# Patient Record
Sex: Male | Born: 1940 | Race: White | Hispanic: No | Smoking: Former smoker
Health system: Southern US, Community
[De-identification: ages and names within clinical notes are randomized; demographics above are authoritative.]

## PROBLEM LIST (undated history)

## (undated) DIAGNOSIS — E119 Type 2 diabetes mellitus without complications: Secondary | ICD-10-CM

## (undated) DIAGNOSIS — J309 Allergic rhinitis, unspecified: Secondary | ICD-10-CM

## (undated) DIAGNOSIS — G709 Myoneural disorder, unspecified: Secondary | ICD-10-CM

## (undated) DIAGNOSIS — N3281 Overactive bladder: Secondary | ICD-10-CM

## (undated) DIAGNOSIS — K4090 Unilateral inguinal hernia, without obstruction or gangrene, not specified as recurrent: Secondary | ICD-10-CM

## (undated) DIAGNOSIS — G20A1 Parkinson's disease without dyskinesia, without mention of fluctuations: Secondary | ICD-10-CM

## (undated) DIAGNOSIS — M1990 Unspecified osteoarthritis, unspecified site: Secondary | ICD-10-CM

## (undated) DIAGNOSIS — Z87442 Personal history of urinary calculi: Secondary | ICD-10-CM

## (undated) DIAGNOSIS — N189 Chronic kidney disease, unspecified: Secondary | ICD-10-CM

## (undated) DIAGNOSIS — I10 Essential (primary) hypertension: Secondary | ICD-10-CM

## (undated) DIAGNOSIS — E785 Hyperlipidemia, unspecified: Secondary | ICD-10-CM

## (undated) DIAGNOSIS — F329 Major depressive disorder, single episode, unspecified: Secondary | ICD-10-CM

## (undated) DIAGNOSIS — R32 Unspecified urinary incontinence: Secondary | ICD-10-CM

## (undated) DIAGNOSIS — C61 Malignant neoplasm of prostate: Secondary | ICD-10-CM

## (undated) DIAGNOSIS — R319 Hematuria, unspecified: Secondary | ICD-10-CM

## (undated) DIAGNOSIS — T7840XA Allergy, unspecified, initial encounter: Secondary | ICD-10-CM

## (undated) DIAGNOSIS — IMO0001 Reserved for inherently not codable concepts without codable children: Secondary | ICD-10-CM

## (undated) DIAGNOSIS — G20 Parkinson's disease: Secondary | ICD-10-CM

## (undated) DIAGNOSIS — Z8489 Family history of other specified conditions: Secondary | ICD-10-CM

## (undated) DIAGNOSIS — F32A Depression, unspecified: Secondary | ICD-10-CM

## (undated) HISTORY — PX: JOINT REPLACEMENT: SHX530

## (undated) HISTORY — DX: Allergy, unspecified, initial encounter: T78.40XA

## (undated) HISTORY — DX: Essential (primary) hypertension: I10

## (undated) HISTORY — DX: Parkinson's disease: G20

## (undated) HISTORY — PX: FRACTURE SURGERY: SHX138

## (undated) HISTORY — PX: EYE SURGERY: SHX253

## (undated) HISTORY — DX: Unspecified osteoarthritis, unspecified site: M19.90

## (undated) HISTORY — DX: Malignant neoplasm of prostate: C61

## (undated) HISTORY — PX: OTHER SURGICAL HISTORY: SHX169

## (undated) HISTORY — DX: Personal history of urinary calculi: Z87.442

## (undated) HISTORY — DX: Unilateral inguinal hernia, without obstruction or gangrene, not specified as recurrent: K40.90

## (undated) HISTORY — PX: HERNIA REPAIR: SHX51

## (undated) HISTORY — PX: URETEROSCOPY WITH HOLMIUM LASER LITHOTRIPSY: SHX6645

## (undated) HISTORY — DX: Parkinson's disease without dyskinesia, without mention of fluctuations: G20.A1

## (undated) HISTORY — DX: Hyperlipidemia, unspecified: E78.5

## (undated) HISTORY — PX: VASECTOMY: SHX75

---

## 1991-07-14 HISTORY — PX: OTHER SURGICAL HISTORY: SHX169

## 2003-08-31 ENCOUNTER — Other Ambulatory Visit: Payer: Self-pay

## 2008-02-14 ENCOUNTER — Ambulatory Visit: Payer: Self-pay | Admitting: Specialist

## 2008-02-21 ENCOUNTER — Ambulatory Visit: Payer: Self-pay | Admitting: Specialist

## 2008-07-13 HISTORY — PX: LITHOTRIPSY: SUR834

## 2009-10-16 ENCOUNTER — Emergency Department: Payer: Self-pay | Admitting: Emergency Medicine

## 2009-10-23 ENCOUNTER — Ambulatory Visit: Payer: Self-pay | Admitting: Specialist

## 2010-09-11 LAB — PSA: PSA: <0.1

## 2011-01-14 ENCOUNTER — Emergency Department: Payer: Self-pay | Admitting: Emergency Medicine

## 2011-03-07 ENCOUNTER — Emergency Department: Payer: Self-pay | Admitting: Emergency Medicine

## 2011-03-10 ENCOUNTER — Other Ambulatory Visit: Payer: Self-pay | Admitting: Unknown Physician Specialty

## 2011-03-10 ENCOUNTER — Inpatient Hospital Stay
Admission: RE | Admit: 2011-03-10 | Discharge: 2011-03-10 | Disposition: A | Discharge status: Home / Self Care | Payer: BC Managed Care – PPO | Source: Ambulatory Visit

## 2011-03-10 ENCOUNTER — Ambulatory Visit
Admission: RE | Admit: 2011-03-10 | Discharge: 2011-03-10 | Disposition: A | Discharge status: Home / Self Care | Payer: BC Managed Care – PPO | Source: Ambulatory Visit | Attending: Unknown Physician Specialty | Admitting: Unknown Physician Specialty

## 2011-03-10 DIAGNOSIS — N200 Calculus of kidney: Secondary | ICD-10-CM

## 2011-04-02 ENCOUNTER — Ambulatory Visit: Payer: Self-pay | Admitting: Urology

## 2011-04-16 ENCOUNTER — Ambulatory Visit: Payer: Self-pay | Admitting: Urology

## 2012-03-15 ENCOUNTER — Ambulatory Visit: Payer: Self-pay | Admitting: Family Medicine

## 2012-05-04 ENCOUNTER — Emergency Department: Payer: Self-pay | Admitting: Emergency Medicine

## 2012-05-05 ENCOUNTER — Other Ambulatory Visit: Payer: Self-pay | Admitting: Family Medicine

## 2012-05-05 DIAGNOSIS — M5440 Lumbago with sciatica, unspecified side: Secondary | ICD-10-CM

## 2012-05-09 ENCOUNTER — Other Ambulatory Visit: Payer: Self-pay | Admitting: Family Medicine

## 2012-05-09 DIAGNOSIS — M545 Low back pain, unspecified: Secondary | ICD-10-CM

## 2012-05-10 ENCOUNTER — Ambulatory Visit
Admission: RE | Admit: 2012-05-10 | Discharge: 2012-05-10 | Disposition: A | Discharge status: Home / Self Care | Payer: BC Managed Care – PPO | Source: Ambulatory Visit | Attending: Family Medicine | Admitting: Family Medicine

## 2012-05-10 ENCOUNTER — Other Ambulatory Visit: Payer: Self-pay

## 2012-05-10 DIAGNOSIS — M545 Low back pain, unspecified: Secondary | ICD-10-CM

## 2012-05-10 DIAGNOSIS — M5137 Other intervertebral disc degeneration, lumbosacral region: Secondary | ICD-10-CM | POA: Insufficient documentation

## 2012-05-24 ENCOUNTER — Ambulatory Visit: Payer: Self-pay | Admitting: Urology

## 2012-07-13 HISTORY — PX: BACK SURGERY: SHX140

## 2012-11-24 ENCOUNTER — Other Ambulatory Visit: Payer: Self-pay | Admitting: Specialist

## 2012-11-24 LAB — SYNOVIAL CELL COUNT + DIFF, W/ CRYSTALS
Basophil: 0 %
Crystals, Joint Fluid: NONE SEEN
Eosinophil: 0 %
Lymphocytes: 14 %
Neutrophils: 22 %
Nucleated Cell Count: 408 /mm3
Other Cells BF: 0 %
Other Mononuclear Cells: 64 %

## 2012-11-28 LAB — BODY FLUID CULTURE

## 2013-04-05 ENCOUNTER — Other Ambulatory Visit: Payer: Self-pay | Admitting: Neurology

## 2013-04-05 DIAGNOSIS — R251 Tremor, unspecified: Secondary | ICD-10-CM

## 2013-04-05 DIAGNOSIS — R269 Unspecified abnormalities of gait and mobility: Secondary | ICD-10-CM

## 2013-04-12 ENCOUNTER — Ambulatory Visit
Admission: RE | Admit: 2013-04-12 | Discharge: 2013-04-12 | Disposition: A | Discharge status: Home / Self Care | Payer: BC Managed Care – PPO | Source: Ambulatory Visit | Attending: Neurology | Admitting: Neurology

## 2013-04-12 DIAGNOSIS — R251 Tremor, unspecified: Secondary | ICD-10-CM

## 2013-04-12 DIAGNOSIS — R269 Unspecified abnormalities of gait and mobility: Secondary | ICD-10-CM

## 2013-04-26 ENCOUNTER — Ambulatory Visit: Payer: Self-pay | Admitting: Urology

## 2013-04-27 ENCOUNTER — Ambulatory Visit: Payer: Self-pay | Admitting: Urology

## 2013-07-13 HISTORY — PX: OTHER SURGICAL HISTORY: SHX169

## 2013-09-27 ENCOUNTER — Ambulatory Visit: Payer: Self-pay | Admitting: Ophthalmology

## 2013-10-11 ENCOUNTER — Ambulatory Visit: Payer: Self-pay | Admitting: Ophthalmology

## 2014-02-19 ENCOUNTER — Ambulatory Visit: Payer: Self-pay | Admitting: Urology

## 2014-02-22 LAB — PATHOLOGY REPORT

## 2014-03-21 ENCOUNTER — Ambulatory Visit: Payer: Self-pay | Admitting: Specialist

## 2014-03-21 LAB — URINALYSIS, COMPLETE
Bacteria: NONE SEEN
Bilirubin,UR: NEGATIVE
Blood: NEGATIVE
Glucose,UR: NEGATIVE mg/dL (ref 0–75)
Ketone: NEGATIVE
Nitrite: NEGATIVE
Ph: 7 (ref 4.5–8.0)
Protein: NEGATIVE
RBC,UR: NONE SEEN /HPF (ref 0–5)
Specific Gravity: 1.013 (ref 1.003–1.030)
Squamous Epithelial: NONE SEEN
WBC UR: 16 /HPF (ref 0–5)

## 2014-03-21 LAB — BASIC METABOLIC PANEL
Anion Gap: 7 (ref 7–16)
BUN: 19 mg/dL — ABNORMAL HIGH (ref 7–18)
Calcium, Total: 8.8 mg/dL (ref 8.5–10.1)
Chloride: 103 mmol/L (ref 98–107)
Co2: 30 mmol/L (ref 21–32)
Creatinine: 0.96 mg/dL (ref 0.60–1.30)
EGFR (African American): >60
EGFR (Non-African Amer.): >60
Glucose: 89 mg/dL (ref 65–99)
Osmolality: 281 (ref 275–301)
Potassium: 4.2 mmol/L (ref 3.5–5.1)
Sodium: 140 mmol/L (ref 136–145)

## 2014-03-21 LAB — CBC
HCT: 43.2 % (ref 40.0–52.0)
HGB: 14 g/dL (ref 13.0–18.0)
MCH: 30.6 pg (ref 26.0–34.0)
MCHC: 32.3 g/dL (ref 32.0–36.0)
MCV: 95 fL (ref 80–100)
Platelet: 209 10*3/uL (ref 150–440)
RBC: 4.56 10*6/uL (ref 4.40–5.90)
RDW: 13.7 % (ref 11.5–14.5)
WBC: 6.4 10*3/uL (ref 3.8–10.6)

## 2014-03-21 LAB — MRSA PCR SCREENING

## 2014-03-21 LAB — PROTIME-INR
INR: 1
Prothrombin Time: 13 secs (ref 11.5–14.7)

## 2014-04-02 ENCOUNTER — Inpatient Hospital Stay: Payer: Self-pay | Admitting: Specialist

## 2014-04-03 LAB — CBC WITH DIFFERENTIAL/PLATELET
Basophil #: 0 10*3/uL (ref 0.0–0.1)
Basophil %: 0.2 %
Eosinophil #: 0.2 10*3/uL (ref 0.0–0.7)
Eosinophil %: 2.6 %
HCT: 32.2 % — ABNORMAL LOW (ref 40.0–52.0)
HGB: 10.6 g/dL — ABNORMAL LOW (ref 13.0–18.0)
Lymphocyte #: 0.8 10*3/uL — ABNORMAL LOW (ref 1.0–3.6)
Lymphocyte %: 8.8 %
MCH: 31.2 pg (ref 26.0–34.0)
MCHC: 33 g/dL (ref 32.0–36.0)
MCV: 95 fL (ref 80–100)
Monocyte #: 1.2 x10 3/mm — ABNORMAL HIGH (ref 0.2–1.0)
Monocyte %: 13.9 %
Neutrophil #: 6.5 10*3/uL (ref 1.4–6.5)
Neutrophil %: 74.5 %
Platelet: 159 10*3/uL (ref 150–440)
RBC: 3.4 10*6/uL — ABNORMAL LOW (ref 4.40–5.90)
RDW: 13.9 % (ref 11.5–14.5)
WBC: 8.8 10*3/uL (ref 3.8–10.6)

## 2014-04-03 LAB — BASIC METABOLIC PANEL
Anion Gap: 5 — ABNORMAL LOW (ref 7–16)
BUN: 21 mg/dL — ABNORMAL HIGH (ref 7–18)
Calcium, Total: 7.1 mg/dL — ABNORMAL LOW (ref 8.5–10.1)
Chloride: 102 mmol/L (ref 98–107)
Co2: 27 mmol/L (ref 21–32)
Creatinine: 1.53 mg/dL — ABNORMAL HIGH (ref 0.60–1.30)
EGFR (African American): 51 — ABNORMAL LOW
EGFR (Non-African Amer.): 44 — ABNORMAL LOW
Glucose: 108 mg/dL — ABNORMAL HIGH (ref 65–99)
Osmolality: 272 (ref 275–301)
Potassium: 3.8 mmol/L (ref 3.5–5.1)
Sodium: 134 mmol/L — ABNORMAL LOW (ref 136–145)

## 2014-04-03 LAB — TSH: Thyroid Stimulating Horm: 1.25 u[IU]/mL

## 2014-04-04 LAB — BASIC METABOLIC PANEL
Anion Gap: 4 — ABNORMAL LOW (ref 7–16)
BUN: 20 mg/dL — ABNORMAL HIGH (ref 7–18)
Calcium, Total: 7.7 mg/dL — ABNORMAL LOW (ref 8.5–10.1)
Chloride: 107 mmol/L (ref 98–107)
Co2: 29 mmol/L (ref 21–32)
Creatinine: 1.3 mg/dL (ref 0.60–1.30)
EGFR (African American): >60
EGFR (Non-African Amer.): 54 — ABNORMAL LOW
Glucose: 129 mg/dL — ABNORMAL HIGH (ref 65–99)
Osmolality: 284 (ref 275–301)
Potassium: 4.1 mmol/L (ref 3.5–5.1)
Sodium: 140 mmol/L (ref 136–145)

## 2014-04-04 LAB — HEMOGLOBIN: HGB: 12.1 g/dL — ABNORMAL LOW (ref 13.0–18.0)

## 2014-04-06 LAB — BASIC METABOLIC PANEL
Anion Gap: 7 (ref 7–16)
BUN: 13 mg/dL (ref 7–18)
Calcium, Total: 8.1 mg/dL — ABNORMAL LOW (ref 8.5–10.1)
Chloride: 103 mmol/L (ref 98–107)
Co2: 26 mmol/L (ref 21–32)
Creatinine: 0.94 mg/dL (ref 0.60–1.30)
EGFR (African American): >60
EGFR (Non-African Amer.): >60
Glucose: 121 mg/dL — ABNORMAL HIGH (ref 65–99)
Osmolality: 273 (ref 275–301)
Potassium: 3.8 mmol/L (ref 3.5–5.1)
Sodium: 136 mmol/L (ref 136–145)

## 2014-06-19 LAB — BASIC METABOLIC PANEL
BUN: 20 mg/dL (ref 4–21)
Creatinine: 1 mg/dL (ref 0.6–1.3)
Glucose: 97 mg/dL
Potassium: 4.5 mmol/L (ref 3.4–5.3)

## 2014-06-19 LAB — HEPATIC FUNCTION PANEL
ALT: 15 U/L (ref 10–40)
AST: 19 U/L (ref 14–40)
Alkaline Phosphatase: 73 U/L (ref 25–125)
Bilirubin, Total: 0.6 mg/dL

## 2014-06-19 LAB — CBC AND DIFFERENTIAL
HCT: 43 % (ref 41–53)
Hemoglobin: 14.4 g/dL (ref 13.5–17.5)
Neutrophils Absolute: 4 /uL
Platelets: 253 10*3/uL (ref 150–399)
WBC: 6.2 10^3/mL

## 2014-06-19 LAB — TSH: TSH: 1.9 u[IU]/mL (ref 0.41–5.90)

## 2014-10-04 LAB — HEMOGLOBIN A1C: Hgb A1c MFr Bld: 6.3 % — AB (ref 4.0–6.0)

## 2014-10-04 LAB — LIPID PANEL
Cholesterol: 226 mg/dL — AB (ref 0–200)
HDL: 47 mg/dL (ref 35–70)
LDl/HDL Ratio: 3.4
Triglycerides: 98 mg/dL (ref 40–160)

## 2014-11-03 NOTE — Op Note (Signed)
PATIENT NAME:  Roger Rodriguez, PILE MR#:  [id number] DATE OF BIRTH:  1940-11-15  DATE OF PROCEDURE:  04/02/2014  PREOPERATIVE DIAGNOSIS: Advanced osteoarthritis right knee with slight increased valgus deformity.   POSTOPERATIVE DIAGNOSIS: Advanced osteoarthritis right knee with slight increased valgus deformity.   OPERATION: DePuy cemented rotating platform LCS total knee replacement (large femur/patella, number 4 keeled tibia, 12.5 mm rotating platform polyethylene insert).   SURGEON: Park Breed, MD.    ANESTHESIA: Spinal.   COMPLICATIONS: None.   DRAINS: 2 Autovacs.   ESTIMATED BLOOD LOSS: None.   REPLACED: None.    OPERATIVE PROCEDURE: The patient was brought to the operating room where he underwent satisfactory spinal anesthesia and was placed in the supine position on the operating room table and padded appropriately. The right leg was prepped and draped in sterile fashion and an Esmarch applied. The tourniquet was inflated to 300 mmHg. Tourniquet time was 122 minutes. An anterior midline incision was made and dissection carried out sharply down through subcutaneous tissue. The medial arthrotomy was carried out and soft tissue dissection carried out. The anterior tibial cutting guide was pinned in place and the proximal tibial cut made. The femur was sized as a large. The ligaments were balanced by releasing medially. The anterior femoral cutting guide was put in place with the centering hole and rotation guide was inserted. Flexion gap was established at 12.5 mm with excellent positioning. The femoral cutting guide was pinned in place and the anterior posterior cuts made. A 4 degree valgus distal femoral cutting guide was inserted and the distal cut made. The extension gap was established at 12.5 mm and was stable. The finishing guide was applied to the femur and the finishing cuts made.  The tibia was sized as number 4.  A centering hole was made. The trial was inserted along with a 12.5  tibial trial. The femoral component was inserted and the knee articulated well. There was excellent alignment with good extension and flexion and good stability. The patella was cut and drilled and trial inserted. This tracked well also. There was no need for a lateral release. The trials were removed and the joint irrigated. Soft tissues were infiltrated with 0.25% Marcaine with epinephrine, morphine, and Toradol. The cement was mixed and the number 4 keeled tibia, large femur, and large patella were all cemented in place with a 12.5 mm rotating platform polyethylene insert. Excess cement was removed and the knee was held in extension while the cement hardened fully. The joint was then irrigated again. The Autovacs were inserted. Bone wax was placed on raw bony surfaces. The capsule was closed with number 2 Quill suture. The subcutaneous tissue was closed with 0 Quill after further irrigation and the skin was closed with staples. Dry sterile dressing was applied over TENS pads and a Polar Care and knee immobilizer were applied. Tourniquet had been deflated with good return of blood flow to the foot. The patient was transferred to a hospital bed and taken to recovery in good condition.     ____________________________ Park Breed, MD hem:bu D: 04/02/2014 15:51:01 ET T: 04/02/2014 20:16:47 ET JOB#: [id number]  cc: Park Breed, MD, <Dictator> Park Breed MD ELECTRONICALLY SIGNED 04/03/2014 14:58

## 2014-11-03 NOTE — Op Note (Signed)
PATIENT NAME:  Roger Rodriguez, Roger Rodriguez MR#:  [id number] DATE OF BIRTH:  04-15-1941  DATE OF PROCEDURE:  02/19/2014  PREOPERATIVE DIAGNOSIS: Right inguinal hernia.   POSTOPERATIVE DIAGNOSIS: Right inguinal hernia.   PROCEDURE: Right inguinal herniorrhaphy.   SURGEON: Otelia Limes. Yves Dill, MD  ANESTHETIST: Estanislado Emms, MD and Otelia Limes. Yves Dill, MD.   ANESTHETIC METHOD: General per Dr. Carolin Sicks and local per Dr. Yves Dill.   INDICATIONS: See the dictated history and physical. After informed consent, the patient requests the above procedure.   OPERATIVE SUMMARY: After adequate general anesthesia had been obtained, perineum was prepped and draped in the usual fashion. A right inguinal crease incision was made and carried down sharply through the skin and through the subcutaneous fatty tissue with cautery. The external oblique fascia was identified and cleared of overlying fatty tissue. The external oblique fascia was then opened along the course of its fibers exposing the spermatic cord and hernia. The spermatic cord and hernia sac were delivered into the surgical field. The hernia sac was dissected caudad to the internal ring off of the cord. The hernia sac was then ligated at its base with a 2-0 Surgilon and the sac was excised and submitted to pathology. At this point, a space was developed in the retropubic area using blunt dissection. This retropubic space was then irrigated with GU irrigant. The Ethicon PHS graft was then selected and circular portion unfurled in the retropubic space. The oblong portion was then placed along the course of the external oblique fibers beneath the external oblique. A keyhole incision was made laterally to accommodate the spermatic cord. The keyhole edges were brought around the cord and then anchored to the inguinal ligament with a 2-0 Surgilon suture. The spermatic cord then was placed into its normal anatomic position. The oblong portion of the graft was then flattened  beneath the external oblique fibers. The distal edge of the oblong portion of the graft was then sutured to the pubic tubercle with a 2-0 Surgilon suture. Surgical field was then again irrigated with GU irrigant. The external oblique fascia was then reapproximated with interrupted 2 0 Surgilon suture. Subcutaneous fatty tissue was reapproximated with interrupted 3-0 chromic. Skin was closed with 4-0 Vicryl subcuticular closure. At this point, local anesthesia was administered. Xylocaine 1% with epinephrine was used along the inguinal incision and a spermatic cord block was performed with 0.25% Marcaine. At this point, Dermabond, benzoin, Steri-Strips, and a sterile dressing were applied. A scrotal supporter was applied. The procedure was then terminated and patient was transferred to the recovery room in stable condition. Sponge and needle counts and instrument counts were noted correct.   ____________________________ Otelia Limes. Yves Dill, MD mrw:sk D: 02/19/2014 16:53:46 ET T: 02/20/2014 00:15:48 ET JOB#: [id number]  cc: Otelia Limes. Yves Dill, MD, <Dictator> Royston Cowper MD ELECTRONICALLY SIGNED 02/20/2014 11:49

## 2014-11-03 NOTE — Consult Note (Signed)
PATIENT NAME:  Roger Rodriguez, Roger Rodriguez MR#:  [id number] DATE OF BIRTH:  1940/09/20  DATE OF CONSULTATION:  04/03/2014  CONSULTING PHYSICIAN:  Norva Riffle. Marcille Blanco, MD  REASON FOR CONSULT:  Hypotension.   HISTORY OF PRESENT ILLNESS:  This is a 74 year old Caucasian male who was admitted to the hospital for a right total knee replacement. The patient had been in his usual state of relatively good health prior to admission and had no complications with his surgery. Today is postoperative day #1, and the patient had been pain-free and had normal vital signs until the early hours of this morning when his blood pressure began to trend down. He was given a 500 mL bolus for systolic pressure of 70. His blood pressure has minimally improved up to 80, and internal medicine service was consulted for further guidance.   REVIEW OF SYSTEMS:  Is not obtainable at this time, as the patient is soundly asleep. His vital signs are normal, and there is no concern for acute mental status change. He has been awake for a long period of time.   PAST MEDICAL HISTORY:  Parkinson disease, osteoarthritis, prostate cancer status post resection, and hypertension.   PAST SURGICAL HISTORY:  Uteroscopic stone extraction in 2003, lithotripsy in 2003, radical prostatectomy in 2005, vasectomy, rotator cuff repair in 1993, and a right knee arthroscopy in 1990.   FAMILY HISTORY:  Not obtainable at this time, as the patient is fast asleep.   SOCIAL HISTORY:  The patient lives with his wife, who has Alzheimer disease. He does not smoke, do drugs, or drink.   HOME MEDICATIONS:   1.  Celebrex 200 mg 1 cap p.o. b.i.d.  2.  Tramadol 50 mg 1 tab p.o. daily as needed for pain.  3.  Prinivil 5 mg 1 tablet p.o. daily.  4.  Claritin 10 mg 1 tab p.o. daily.  5.  Carbidopa/levodopa 25/100 mg tablet 1 tab p.o. b.i.d. 6.  Furosemide 20 mg 1 tab p.o. daily.  7.  Rhinocort Aqua 32 mcg/inhalation 1 spray to each nostril daily as needed.  8.  Myrbetriq 50  mg 1 tab p.o. daily.  9.  Vitamin C 500 mg 1 tab p.o. daily.  10.  Vitamin D3, 1000 international units 1 capsule p.o. daily.  11.  Vitamin E 400 international units 1 capsule p.o. daily.   ALLERGIES:  No known drug allergies.   PERTINENT LABORATORY RESULTS AND RADIOGRAPHIC FINDINGS:  Right knee x-ray shows potential foreign bodies, osseous demineralization, and a right knee prosthesis without definite fracture or dislocation.   PHYSICAL EXAMINATION: VITAL SIGNS:  Temperature is 98.1, pulse 71, respirations 16, blood pressure 85/46, pulse oximetry 94% on room air.  GENERAL:  The patient is soundly asleep in no apparent distress.  HEENT:  Normocephalic, atraumatic. Pupils are reactive to light. Mucous membranes are moist.  NECK:  Trachea is midline. No adenopathy.  CHEST:  Symmetric and atraumatic.  CARDIOVASCULAR:  Regular rate and rhythm. Normal S1, S2. No rubs, clicks, or murmurs appreciated.  LUNGS:  Clear to auscultation bilaterally. Normal effort and excursion.  ABDOMEN:  Positive bowel sounds. Soft, nontender, nondistended. No hepatosplenomegaly.  GENITOURINARY:  Foley is in place.  MUSCULOSKELETAL:  The patient has a knee immobilizer on the right knee, as well as a drain that is placed at the surgical site. He also has the Polar Care unit placed on his leg. The patient shifted in bed, and he clearly moves his upper extremities equally. Again, he is soundly asleep and  is not cooperative with physical examination.  SKIN:  No rashes or lesions. The surgical site is clean and dressed.  EXTREMITIES:  No clubbing, cyanosis, or edema.  NEUROLOGIC:  The patient is barely arousable in a state of sleep. He is uncooperative with neurologic exam.  PSYCHIATRIC:  Nursing report prior to my exam is that the patient's mood was normal and affect is congruent.   ASSESSMENT AND PLAN:  This is a 74 year old with a total right knee replacement with some asymptomatic hypotension.   1.  Hypotension.  Following a 0.5 liter bolus, blood pressure did improve, indicating that it is fluid responsive. At this time, I think that he simply has not had enough fluid resuscitation following surgery. I have ordered 1 liter fluid bolus for the patient at this time. I will also obtain some morning labs including thyroid stimulating hormone to ensure that decreased metabolic rate is not the cause for this gentleman's relative hypotension. At this time, we will hold his antihypertensive medicines.  2.  Vitamin replacement. Continue the patient's home regimen.  3.  Deep vein thrombosis prophylaxis. Continue Lovenox.  4.  Gastrointestinal prophylaxis is not necessary, as the patient is not critically ill.   Time spent on patient care and review of orders:  Approximately 30 minutes.   ____________________________ Norva Riffle. Marcille Blanco, MD msd:nb D: 04/03/2014 04:48:44 ET T: 04/03/2014 05:46:09 ET JOB#: [id number]  cc: Norva Riffle. Marcille Blanco, MD, <Dictator> Norva Riffle Beckham Buxbaum MD ELECTRONICALLY SIGNED 04/07/2014 0:51

## 2014-11-03 NOTE — Discharge Summary (Signed)
PATIENT NAME:  Roger Rodriguez, Roger Rodriguez MR#:  [id number] DATE OF BIRTH:  August 17, 1940  DATE OF ADMISSION:  04/02/2014 DATE OF DISCHARGE: 04/06/2014.   FINAL DIAGNOSES: 1. Advanced osteoarthritis, right knee.  2. Parkinson's disease.  3. History of prostate cancer.  4. Hypertension.  5. History of kidney stones.   OPERATIONS: A cemented DePuy rotating platform total knee replacement on 04/02/2014.   COMPLICATIONS: None.   CONSULTATIONS: Primary doctor.   DISCHARGE MEDICATIONS: Celebrex 200 mg daily, Neurontin 400 mg b.i.d., Norco 5/325 every 6 hours p.r.n. pain, enteric-coated aspirin 1 p.o. b.i.d., home medications as prior to admission.   HISTORY: The patient is a 74 year old male with advanced osteoarthritis of the right knee and valgus deformity. He had arthroscopy several years ago. He had progressive pain and deformity despite injections, bracing and medication. He had rest and therapy without relief. It has interfered with his activities and work. He desired to have a total knee replacement done to alleviate this. X-rays showed severe arthritis in all compartments with valgus deformity and spurring. There was sclerosis of some cyst formation present which interfered with activities of daily living as well.   PAST MEDICAL HISTORY: Illnesses above.   PAST SURGICAL HISTORY: Cataracts, TURP, back surgery, right knee arthroscopy.   ALLERGIES: None.   HOME MEDICATIONS: Celebrex, vitamin D3, Prinivil, furosemide, carbidopa, tramadol as needed.   REVIEW OF SYSTEMS: Unremarkable.   FAMILY HISTORY: Unremarkable.   SOCIAL HISTORY: The patient lives at home and helps take care of an invalid wife. Does not smoke or drink.   PHYSICAL EXAMINATION:  GENERAL: The patient was alert and cooperative.  EXTREMITIES: The right knee shows 15 degrees of valgus.  NEUROLOGIC: Good distally. Circulation is intact.  SKIN: The skin was intact.  MUSCULOSKELETAL: There was pain at the joint line with significant  crepitus with movement. Motion is 5 to 95 degrees.   LABORATORY DATA: On admission was satisfactory.   HOSPITAL COURSE: On 04/02/2014, the patient underwent a cemented DePuy right total knee replacement. Postoperatively, the patient did well. He had some hypotension the first night postoperatively, which responded to fluid replacement. His hemoglobin remained stable. He had very little pain the first day and a half. He made good progress with physical therapy in a slow manner and it was felt that he was not ready for discharge on the third postoperative day. He was still having quite a bit of pain and inability to ambulate as far as we would wish. He is discharged on 04/06/2014. He will get home health, PT and return to the office in 2 weeks for exam. He will take buffered aspirin and partial weight bearing.     ____________________________ Park Breed, MD hem:TT D: 04/06/2014 13:35:58 ET T: 04/06/2014 15:12:37 ET JOB#: [id number]  cc: Park Breed, MD, <Dictator> Richard L. Rosanna Randy, [place] MD ELECTRONICALLY SIGNED 04/08/2014 1:44

## 2014-11-03 NOTE — H&P (Signed)
PATIENT NAME:  Roger Rodriguez, Roger Rodriguez MR#:  [id number] DATE OF BIRTH:  [birth year]  DATE OF ADMISSION:  02/19/2014  CHIEF COMPLAINT: Right groin pain and swelling.   HISTORY OF PRESENT ILLNESS: The patient is a 73 year old, African American male with a one-year history of intermittent right groin pain and swelling. He was found to have a hernia in January. His discomfort has become more bothersome, and he would like to proceed with surgery.   ALLERGIES: No drug allergies.   CURRENT MEDICATIONS: Include loratadine, Myrbetriq, tramadol, Prinivil, Celebrex, Rhinocort, vitamin E, vitamin D, Tussionex ER, vitamin C, and carbidopa.   PAST SURGICAL HISTORY: Included:  1.  Ureteroscopic stone extraction in 2003. 2.  Lithotripsy in 2003.  3.  Radical prostatectomy 2005.  4.  Vasectomy in the distal past.  5.  Rotator repair in 1993. 6.  Right knee arthroscopy, 1990s.    SOCIAL HISTORY: The patient denied tobacco or alcohol use.   FAMILY HISTORY: Noncontributory.   PAST AND CURRENT MEDICAL CONDITIONS:  1.  Hyperlipidemia.  2.  Hypertension.  3.  Benign tremor. 4.  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 5.  Erectile dysfunction.  6.  Lumbosacral spondylosis with myelopathy. 7.  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 8.  Degenerative disk disease.    REVIEW OF SYSTEMS: The patient denied chest pain, shortness of breath, diabetes, or stroke.   PHYSICAL EXAMINATION: GENERAL: A well-nourished African American male, in no acute distress.  HEENT: Sclerae were clear. Pupils were equal, round, reactive to light and accommodation. Extraocular movements were intact.  NECK: Supple. No palpable adenopathy. No audible carotid bruits.  LUNGS: Clear to auscultation.  CARDIOVASCULAR: Regular rhythm and rate without audible murmurs.  ABDOMEN: Soft, nontender abdomen.  GENITOURINARY: Circumcised. He had an easily reducible right inguinal hernia. Testes were smooth and nontender, 20 mL in size each.  RECTAL:  No palpable rectal masses. Prostate absent.  NEUROMUSCULAR: Alert and oriented x3.   IMPRESSION: Right inguinal hernia.   PLAN: Right inguinal herniorrhaphy.     ____________________________ Otelia Limes. Yves Dill, MD mrw:cg D: 02/13/2014 08:29:13 ET T: 02/13/2014 08:40:14 ET JOB#: [id number]  cc: Otelia Limes. Yves Dill, MD, <Dictator> Royston Cowper MD ELECTRONICALLY SIGNED 02/13/2014 9:38

## 2014-11-03 NOTE — H&P (Signed)
   Subjective/Chief Complaint Right knee pain   History of Present Illness 74 year old male with long history of right knee injury and progressive osteoarthritis.  Had arthroscopy years ago for this but has had progressive valgus deformity which is painful and causes problems with work, home activities, climbing, bending, and caring for his wife. He has had injections, bracing, Physical Therapy, rest, and NSAIDs without lasting relief.   He wishes to proceed with total knee replacement.  We have discussed risks and benefits and post op protocol at length.  Risks and benefits of surgery were discussed at length including but not limited to infection, non union, nerve or blood vessed damage, non union, need for repeat surgery, blood clots and lung emboli, and death. X-rays show severe valgus, sclerosis, joint line collapse, and cysts throughout the knee.   Code Status Full Code   Past Med/Surgical Hx:  Arthritis:   Prostate Cancer:   Hypertension:   Kidney Stones:   Cataract Extraction:   TURP - Transurethral Resection of Prostate:   back surgery:   Knee Surgery - Right:   ALLERGIES:  NKDA: None  HOME MEDICATIONS: Medication Instructions Status  CeleBREX 200 mg oral capsule 1 cap(s) orally 2 times a day Active  Claritin 10 mg oral tablet 1 tab(s) orally once a day Active  vitamin E 400 intl units oral capsule 1 cap(s) orally once a day Active  Vitamin D3 1000 intl units oral capsule 1 cap(s) orally once a day Active  Vitamin C 500 mg oral tablet 1 tab(s) orally once a day Active  Prinivil 5 mg oral tablet 1 tab(s) orally once a day (in the morning) Active  furosemide 20 mg oral tablet 1 tab(s) orally once a day Active  carbidopa-levodopa 25 mg-100 mg oral tablet 1 tab(s) orally 2 times a day Active  Rhinocort Aqua 32 mcg/inh nasal spray 1 spray(s) nasal once a day, As Needed Active  traMADol 50 mg oral tablet 1 tab(s) orally once a day, As Needed Active  Myrbetriq 50 mg oral tablet,  extended release 1 tab(s) orally once a day in am Active   Family and Social History:  Family History Non-Contributory   Social History negative tobacco   Place of Living Home   Review of Systems:  Fever/Chills No   Cough No   Sputum No   Abdominal Pain No   SOB/DOE No   Chest Pain No   Physical Exam:  GEN well developed, well nourished, no acute distress   HEENT pink conjunctivae   NECK supple   RESP normal resp effort   CARD regular rate   ABD denies tenderness   LYMPH negative neck   EXTR negative edema, Right knee with 15* valgus.  circulation/sensation/motor function good and skin intact.  Pain at joint line and in patellofemoral region.  range of motion 5-95*.   SKIN normal to palpation   NEURO motor/sensory function intact   PSYCH alert, A+O to time, place, person, good insight    Assessment/Admission Diagnosis Advanced osteoarthritis right knee   Plan right total knee replacement   Electronic Signatures: Park Breed (MD)  (Signed 20-Sep-15 13:00)  Authored: CHIEF COMPLAINT and HISTORY, PAST MEDICAL/SURGIAL HISTORY, ALLERGIES, HOME MEDICATIONS, FAMILY AND SOCIAL HISTORY, REVIEW OF SYSTEMS, PHYSICAL EXAM, ASSESSMENT AND PLAN   Last Updated: 20-Sep-15 13:00 by Park Breed (MD)

## 2014-12-20 ENCOUNTER — Encounter: Payer: Self-pay | Admitting: Speech Pathology

## 2014-12-20 ENCOUNTER — Encounter: Payer: Self-pay | Admitting: Occupational Therapy

## 2014-12-24 ENCOUNTER — Encounter: Payer: Self-pay | Admitting: Speech Pathology

## 2014-12-24 ENCOUNTER — Encounter: Payer: Self-pay | Admitting: Occupational Therapy

## 2014-12-25 ENCOUNTER — Encounter: Payer: Self-pay | Admitting: Speech Pathology

## 2014-12-25 ENCOUNTER — Encounter: Payer: Self-pay | Admitting: Occupational Therapy

## 2014-12-26 ENCOUNTER — Encounter: Payer: Self-pay | Admitting: Speech Pathology

## 2014-12-26 ENCOUNTER — Encounter: Payer: Self-pay | Admitting: Occupational Therapy

## 2014-12-27 ENCOUNTER — Encounter: Payer: Self-pay | Admitting: Speech Pathology

## 2014-12-27 ENCOUNTER — Encounter: Payer: Self-pay | Admitting: Occupational Therapy

## 2014-12-31 ENCOUNTER — Encounter: Payer: Self-pay | Admitting: Occupational Therapy

## 2014-12-31 ENCOUNTER — Encounter: Payer: Self-pay | Admitting: Speech Pathology

## 2015-01-01 ENCOUNTER — Encounter: Payer: Self-pay | Admitting: Occupational Therapy

## 2015-01-01 ENCOUNTER — Encounter: Payer: Self-pay | Admitting: Speech Pathology

## 2015-01-02 ENCOUNTER — Encounter: Payer: Self-pay | Admitting: Speech Pathology

## 2015-01-02 ENCOUNTER — Encounter: Payer: Self-pay | Admitting: Occupational Therapy

## 2015-01-03 ENCOUNTER — Encounter: Payer: Self-pay | Admitting: Speech Pathology

## 2015-01-03 ENCOUNTER — Encounter: Payer: Self-pay | Admitting: Occupational Therapy

## 2015-01-07 ENCOUNTER — Encounter: Payer: Self-pay | Admitting: Speech Pathology

## 2015-01-07 ENCOUNTER — Encounter: Payer: Self-pay | Admitting: Occupational Therapy

## 2015-01-08 ENCOUNTER — Ambulatory Visit: Payer: Self-pay | Admitting: Family Medicine

## 2015-01-08 ENCOUNTER — Encounter: Payer: Self-pay | Admitting: Speech Pathology

## 2015-01-08 ENCOUNTER — Encounter: Payer: Self-pay | Admitting: Occupational Therapy

## 2015-01-09 ENCOUNTER — Encounter: Payer: Self-pay | Admitting: Occupational Therapy

## 2015-01-09 ENCOUNTER — Encounter: Payer: Self-pay | Admitting: Speech Pathology

## 2015-01-10 ENCOUNTER — Encounter: Payer: Self-pay | Admitting: Speech Pathology

## 2015-01-10 ENCOUNTER — Encounter: Payer: Self-pay | Admitting: Occupational Therapy

## 2015-01-11 ENCOUNTER — Ambulatory Visit: Payer: BLUE CROSS/BLUE SHIELD | Admitting: Speech Pathology

## 2015-01-15 ENCOUNTER — Encounter: Payer: Self-pay | Admitting: Occupational Therapy

## 2015-01-15 ENCOUNTER — Encounter: Payer: Self-pay | Admitting: Speech Pathology

## 2015-01-16 ENCOUNTER — Encounter: Payer: Self-pay | Admitting: Occupational Therapy

## 2015-01-16 ENCOUNTER — Encounter: Payer: Self-pay | Admitting: Speech Pathology

## 2015-01-17 ENCOUNTER — Encounter: Payer: Self-pay | Admitting: Speech Pathology

## 2015-01-17 ENCOUNTER — Encounter: Payer: Self-pay | Admitting: Occupational Therapy

## 2015-01-18 ENCOUNTER — Encounter: Payer: Self-pay | Admitting: Occupational Therapy

## 2015-01-18 ENCOUNTER — Encounter: Payer: Self-pay | Admitting: Speech Pathology

## 2015-01-22 ENCOUNTER — Encounter: Payer: Self-pay | Admitting: Speech Pathology

## 2015-01-22 ENCOUNTER — Encounter: Payer: Self-pay | Admitting: Occupational Therapy

## 2015-04-02 ENCOUNTER — Ambulatory Visit: Payer: PPO | Attending: Neurology | Admitting: Speech Pathology

## 2015-04-02 ENCOUNTER — Encounter: Payer: Self-pay | Admitting: Family Medicine

## 2015-04-02 ENCOUNTER — Ambulatory Visit: Payer: Self-pay | Admitting: Family Medicine

## 2015-04-02 ENCOUNTER — Other Ambulatory Visit: Payer: Self-pay

## 2015-04-02 ENCOUNTER — Ambulatory Visit: Payer: PPO | Admitting: Occupational Therapy

## 2015-04-02 ENCOUNTER — Ambulatory Visit (INDEPENDENT_AMBULATORY_CARE_PROVIDER_SITE_OTHER): Payer: PPO | Admitting: Family Medicine

## 2015-04-02 VITALS — BP 118/76 | HR 80 | Temp 97.8°F | Resp 16 | Wt 200.0 lb

## 2015-04-02 DIAGNOSIS — R460 Very low level of personal hygiene: Secondary | ICD-10-CM | POA: Insufficient documentation

## 2015-04-02 DIAGNOSIS — C61 Malignant neoplasm of prostate: Secondary | ICD-10-CM | POA: Insufficient documentation

## 2015-04-02 DIAGNOSIS — M47817 Spondylosis without myelopathy or radiculopathy, lumbosacral region: Secondary | ICD-10-CM | POA: Insufficient documentation

## 2015-04-02 DIAGNOSIS — M6281 Muscle weakness (generalized): Secondary | ICD-10-CM

## 2015-04-02 DIAGNOSIS — R279 Unspecified lack of coordination: Secondary | ICD-10-CM | POA: Diagnosis present

## 2015-04-02 DIAGNOSIS — J309 Allergic rhinitis, unspecified: Secondary | ICD-10-CM | POA: Insufficient documentation

## 2015-04-02 DIAGNOSIS — R490 Dysphonia: Secondary | ICD-10-CM | POA: Diagnosis not present

## 2015-04-02 DIAGNOSIS — L089 Local infection of the skin and subcutaneous tissue, unspecified: Secondary | ICD-10-CM | POA: Insufficient documentation

## 2015-04-02 DIAGNOSIS — E785 Hyperlipidemia, unspecified: Secondary | ICD-10-CM | POA: Insufficient documentation

## 2015-04-02 DIAGNOSIS — N390 Urinary tract infection, site not specified: Secondary | ICD-10-CM | POA: Insufficient documentation

## 2015-04-02 DIAGNOSIS — G20 Parkinson's disease: Secondary | ICD-10-CM | POA: Diagnosis not present

## 2015-04-02 DIAGNOSIS — M79606 Pain in leg, unspecified: Secondary | ICD-10-CM | POA: Insufficient documentation

## 2015-04-02 DIAGNOSIS — R2681 Unsteadiness on feet: Secondary | ICD-10-CM | POA: Insufficient documentation

## 2015-04-02 DIAGNOSIS — I10 Essential (primary) hypertension: Secondary | ICD-10-CM

## 2015-04-02 DIAGNOSIS — R7303 Prediabetes: Secondary | ICD-10-CM | POA: Insufficient documentation

## 2015-04-02 DIAGNOSIS — B159 Hepatitis A without hepatic coma: Secondary | ICD-10-CM | POA: Insufficient documentation

## 2015-04-02 DIAGNOSIS — M722 Plantar fascial fibromatosis: Secondary | ICD-10-CM | POA: Insufficient documentation

## 2015-04-02 DIAGNOSIS — Z23 Encounter for immunization: Secondary | ICD-10-CM

## 2015-04-02 DIAGNOSIS — Z741 Need for assistance with personal care: Secondary | ICD-10-CM

## 2015-04-02 DIAGNOSIS — K4090 Unilateral inguinal hernia, without obstruction or gangrene, not specified as recurrent: Secondary | ICD-10-CM | POA: Insufficient documentation

## 2015-04-02 DIAGNOSIS — M1990 Unspecified osteoarthritis, unspecified site: Secondary | ICD-10-CM | POA: Insufficient documentation

## 2015-04-02 DIAGNOSIS — C439 Malignant melanoma of skin, unspecified: Secondary | ICD-10-CM | POA: Insufficient documentation

## 2015-04-02 DIAGNOSIS — R5383 Other fatigue: Secondary | ICD-10-CM | POA: Insufficient documentation

## 2015-04-02 DIAGNOSIS — G250 Essential tremor: Secondary | ICD-10-CM | POA: Insufficient documentation

## 2015-04-02 DIAGNOSIS — M9979 Connective tissue and disc stenosis of intervertebral foramina of abdomen and other regions: Secondary | ICD-10-CM | POA: Insufficient documentation

## 2015-04-02 DIAGNOSIS — M25569 Pain in unspecified knee: Secondary | ICD-10-CM | POA: Insufficient documentation

## 2015-04-02 DIAGNOSIS — M48061 Spinal stenosis, lumbar region without neurogenic claudication: Secondary | ICD-10-CM | POA: Insufficient documentation

## 2015-04-02 DIAGNOSIS — L723 Sebaceous cyst: Secondary | ICD-10-CM

## 2015-04-02 DIAGNOSIS — N529 Male erectile dysfunction, unspecified: Secondary | ICD-10-CM | POA: Insufficient documentation

## 2015-04-02 DIAGNOSIS — R609 Edema, unspecified: Secondary | ICD-10-CM | POA: Insufficient documentation

## 2015-04-02 NOTE — Progress Notes (Signed)
Patient ID: Roger Rodriguez, male   DOB: 1941/02/09, 74 y.o.   MRN: [id number]    Subjective:  HPI  Hypertension, follow-up:  BP Readings from Last 3 Encounters:  04/02/15 118/76  10/02/14 102/60    He was last seen for hypertension 6 months ago.  BP at that visit was 102/60. Management since that visit includes pt stopped taking his Lisinopril due to dizziness because the Sinemet makes his BP lower too and since he stopped it the dizziness has stopped. He reports fair compliance with treatment. He is not having side effects.  He is not exercising. He is adherent to low salt diet.   Outside blood pressures are 120's/70's. He is experiencing none.   Wt Readings from Last 3 Encounters:  04/02/15 200 lb (90.719 kg)  10/02/14 204 lb (92.534 kg)   ------------------------------------------------------------------------  Pt reports that for his Parkinson's disease he is still doing OT and speech therapy. He has it 4 times a week for 4 weeks. He says it is intense and he gets tired more easily.      Prior to Admission medications   Medication Sig Start Date End Date Taking? Authorizing Provider  aspirin 325 MG tablet Take by mouth.   Yes Historical Provider, MD  budesonide (RHINOCORT AQUA) 32 MCG/ACT nasal spray Place into the nose. 10/02/14  Yes Historical Provider, MD  carbidopa-levodopa (SINEMET CR) 50-200 MG per tablet TAKE 1 TABLET BY MOUTH 3 (THREE) TIMES DAILY. 03/21/15  Yes Historical Provider, MD  carbidopa-levodopa (SINEMET IR) 25-100 MG per tablet TAKE 2 TABLETS BY MOUTH 3 (THREE) TIMES DAILY. 01/29/15  Yes Historical Provider, MD  celecoxib (CELEBREX) 200 MG capsule Take 200 mg by mouth 2 (two) times daily. 03/08/15  Yes Historical Provider, MD  cephALEXin (KEFLEX) 500 MG capsule TAKE 1 CAPSULE BY MOUTH EVERY 6 HOURS FOR 10 DAYS 01/05/15  Yes Historical Provider, MD  Cholecalciferol (VITAMIN D) 2000 UNITS tablet Take by mouth. 05/19/12  Yes Historical Provider, MD    furosemide (LASIX) 20 MG tablet Take 1 tablet by mouth daily. 02/05/14  Yes Historical Provider, MD  gabapentin (NEURONTIN) 400 MG capsule Take 400 mg by mouth 2 (two) times daily. 03/26/15  Yes Historical Provider, MD  loratadine (CLARITIN) 10 MG tablet Take 10 mg by mouth daily. 05/19/12  Yes Historical Provider, MD  mirabegron ER (MYRBETRIQ) 50 MG TB24 tablet Take by mouth.   Yes Historical Provider, MD  MYRBETRIQ 25 MG TB24 tablet TAKE 1 TO 2 TABLETS DAILY 01/09/15  Yes Historical Provider, MD  primidone (MYSOLINE) 50 MG tablet Take 50 tablets by mouth 2 (two) times daily.   Yes Historical Provider, MD  sodium chloride (OCEAN) 0.65 % nasal spray Place into the nose.   Yes Historical Provider, MD  VITAMIN E NATURAL PO Take by mouth. 05/19/12  Yes Historical Provider, MD  lisinopril (PRINIVIL,ZESTRIL) 5 MG tablet Take 5 mg by mouth 2 (two) times daily. 01/29/15   Historical Provider, MD    Patient Active Problem List   Diagnosis Date Noted  . Hepatitis A 04/02/2015  . Allergic rhinitis 04/02/2015  . Arthritis 04/02/2015  . Narrowing of intervertebral disc space 04/02/2015  . 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 Accumulation of fluid in tissues 04/02/2015  . Essential (primary) hypertension 04/02/2015  . Borderline diabetes 04/02/2015  . HLD (hyperlipidemia) 04/02/2015  . BP (high blood pressure) 04/02/2015  . Infected sebaceous cyst 04/02/2015  . Hernia, inguinal 04/02/2015  . Leg pain 04/02/2015  . Lumbar canal  stenosis 04/02/2015  . 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 Malignant melanoma 04/02/2015  . Arthritis, degenerative 04/02/2015  . Fatigue 04/02/2015  . Idiopathic Parkinson's disease 04/02/2015  . Plantar fasciitis 04/02/2015  . CA of prostate 04/02/2015  . Benign essential tremor 04/02/2015  . Lower urinary tract infection 04/02/2015    Past Medical History  Diagnosis Date  . Hyperlipidemia   . Hypertension   . Inguinal hernia   . Arthritis     OA   . Allergy     allergic rhinitis  . Cancer     Prostate  . Parkinson's disease     Social History   Social History  . Marital Status: Married    Spouse Name: N/A  . Number of Children: N/A  . Years of Education: N/A   Occupational History  . Not on file.   Social History Main Topics  . Smoking status: Former Smoker    Types: Cigars  . Smokeless tobacco: Current User    Types: Chew     Comment: Occassionally, also chewed tobacco till 2005. Quit smoking 1973  . Alcohol Use: No  . Drug Use: No  . Sexual Activity: Not on file   Other Topics Concern  . Not on file   Social History Narrative    Not on File  Review of Systems  Constitutional: Positive for malaise/fatigue.  HENT: Negative.   Eyes: Negative.   Respiratory: Negative.   Cardiovascular: Negative.   Gastrointestinal: Negative.   Genitourinary: Negative.   Musculoskeletal: Negative.   Skin: Negative.   Neurological: Positive for tremors.  Endo/Heme/Allergies: Negative.   Psychiatric/Behavioral: Negative.     Immunization History  Administered Date(s) Administered  . Pneumococcal Polysaccharide-23 04/06/2013, 04/05/2014  . Td 11/30/2006  . Zoster 04/06/2013   Objective:  BP 118/76 mmHg  Pulse 80  Temp(Src) 97.8 F (36.6 C) (Oral)  Resp 16  Wt 200 lb (90.719 kg)  Physical Exam  Constitutional: He is oriented to person, place, and time and well-developed, well-nourished, and in no distress.  HENT:  Head: Normocephalic and atraumatic.  Right Ear: External ear normal.  Left Ear: External ear normal.  Nose: Nose normal.  Eyes: Conjunctivae are normal.  Neck: Neck supple.  Cardiovascular: Normal rate, regular rhythm and normal heart sounds.   Pulmonary/Chest: Effort normal and breath sounds normal.  Abdominal: Soft.  Neurological: He is alert and oriented to person, place, and time.  Parkinsons  stigmata.  Skin: Skin is warm and dry.  Psychiatric: Mood, memory, affect and judgment normal.     Lab Results  Component Value Date   WBC 6.2 06/19/2014   HGB 14.4 06/19/2014   HCT 43 06/19/2014   PLT 253 06/19/2014   GLUCOSE 121* 04/06/2014   CHOL 226* 10/04/2014   TRIG 98 10/04/2014   HDL 47 10/04/2014   TSH 1.90 06/19/2014   PSA <0.1 09/11/2010   INR 1.0 03/21/2014   HGBA1C 6.3* 10/04/2014    CMP     Component Value Date/Time   NA 136 04/06/2014 0434   K 4.5 06/19/2014   K 3.8 04/06/2014 0434   CL 103 04/06/2014 0434   CO2 26 04/06/2014 0434   GLUCOSE 121* 04/06/2014 0434   BUN 20 06/19/2014   BUN 13 04/06/2014 0434   CREATININE 1.0 06/19/2014   CREATININE 0.94 04/06/2014 0434   CALCIUM 8.1* 04/06/2014 0434   AST 19 06/19/2014   ALT 15 06/19/2014   ALKPHOS 73 06/19/2014   GFRNONAA >60 04/06/2014 0434  GFRNONAA 54* 04/04/2014 0527   GFRAA >60 04/06/2014 0434   GFRAA >60 04/04/2014 0527    Assessment and Plan :  1. Essential (primary) hypertension BP ok off of lisinopril.This lowering of BP is probably due to PD.  2. Idiopathic Parkinson's disease   3. Need for influenza vaccination - Flu vaccine HIGH DOSE PF 4.Prostate Cancer  I have done the exam and reviewed the above chart and it is accurate to the best of my knowledge.   Miguel Aschoff MD [facility] 04/02/2015 3:29 PM

## 2015-04-03 ENCOUNTER — Ambulatory Visit: Payer: Self-pay | Admitting: Family Medicine

## 2015-04-03 ENCOUNTER — Other Ambulatory Visit: Payer: Self-pay | Admitting: Family Medicine

## 2015-04-03 ENCOUNTER — Encounter: Payer: Self-pay | Admitting: Speech Pathology

## 2015-04-03 NOTE — Therapy (Signed)
[place] MAIN [facility] SERVICES [address] [place], [place], [address] Phone: [phone]   Fax:  [phone]  Speech Language Pathology Evaluation  Patient Details  Name: Roger Rodriguez MRN: [id number] Date of Birth: [birth year] Referring Provider:  Vladimir Crofts, MD  Encounter Date: 04/02/2015      End of Session - 04/03/15 1052    Visit Number 1   Number of Visits 17   Date for SLP Re-Evaluation 05/10/15   SLP Start Time 1003   SLP Stop Time  1053   SLP Time Calculation (min) 50 min   Activity Tolerance Patient tolerated treatment well      Past Medical History  Diagnosis Date   Hyperlipidemia    Hypertension    Inguinal hernia    Arthritis     OA   Allergy     allergic rhinitis   Cancer     Prostate   Parkinson's disease     Past Surgical History  Procedure Laterality Date   Rotator cuff tear  1993    Repaired   H/o arthroscopy of right knee     H/o radical protatectomy     Vasectomy      History    There were no vitals filed for this visit.  Visit Diagnosis: Dysphonia - Plan: SLP plan of care cert/re-cert      Subjective Assessment - 04/03/15 1052    Subjective The patient reports that he has observed changes due to Parkinson's; changes include hypophonia, hoarse vocal quality, and monotone speech.   Currently in Pain? No/denies            SLP Evaluation [facility] - 04/03/15 0001    SLP Visit Information   SLP Received On 04/02/15   Onset Date 10/04/2014   Medical Diagnosis Parkinson's disease   Subjective   Patient/Family Stated Goal To be understood when speaking   Pain Assessment   Currently in Pain? No/denies   Prior Functional Status   Cognitive/Linguistic Baseline --  Worsening speech intelligibility secondary Parkinson's   Standardized Assessments   Standardized Assessments  Other Assessment  LSVT-LOUD Perceptual Voice Evaluation      LSVT-LOUD Voice Evaluation  Maximum  phonation time for sustained ah: 11 seconds  Mean intensity during sustained ah: 58   Mean intensity sustained during conversational speech: 59 dB  Average fundamental frequency during sustained ah: 112 Hz (1.4 STD below mean for age and gender)  Highest dynamic pitch when altering pitch from a low note to a high note: 326 Hz  Highest pitch during conversational speech: 497 Hz  Lowest dynamic pitch when altering from a high note to a low note: 88 Hz  Lowest pitch during conversational speech: 105 Hz  Visi-Pitch: Multi-Dimensional Voice Program (MDVP)  MDVP extracts objective quantitative values (Relative Average Perturbation, Shimmer, Voice Turbulence Index, and Noise to Harmonic Ratio) on sustained phonation, which are displayed graphically and numerically in comparison to a built-in normative database.  The patient exhibited values outside the norm for Relative Average Perturbation, Shimmer, Voice Turbulence Index, and Noise to Harmonic Ratio.  Average fundamental frequency was 1.4 STD below the average for age and gender. The patient improved all parameters when cued to alter voicing (loud like me).           SLP Education - 04/03/15 1052    Education provided Yes   Education Details LSVT-LOUD procedures and rationale   Person(s) Educated Patient   Methods Explanation   Comprehension Verbalized  understanding            SLP Long Term Goals - 2015-04-04 1055    SLP LONG TERM GOAL #1   Title The patient will complete Daily Tasks (Maximum duration "ah", High/Lows, and Functional Phrases) at average loudness of 80 dB and with loud, good quality voice.    Time 4   Period Weeks   Status New   SLP LONG TERM GOAL #2   Title The patient will complete Hierarchal Speech Loudness reading drills (words/phrases, sentences, and paragraph) at average 75 dB and with loud, good quality voice.     Time 4   Period Weeks   Status New   SLP LONG TERM GOAL #3   Title The patient  will complete homework daily.   Time 4   Period Weeks   Status New   SLP LONG TERM GOAL #4   Title The patient will participate in conversation, maintaining average loudness of 75 dB and loud, good quality voice.   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  Based on stimulability testing, the patient is judged to be a good candidate for the LSVT LOUD program.  It is recommended that the patient receive the LSVT LOUD program which is comprised of 16 intensive sessions (4 times per week for 4 weeks, one hour sessions).  Prognosis for improvement is good based on his motivation, stimulability, and strong family support.  LSVT LOUD has been documented in the literature as efficacious for individuals with Parkinson's disease.     Speech Therapy Frequency 4x / week   Duration 4 weeks   Treatment/Interventions Other (comment)  LSVT-LOUD protocol   Potential to Achieve Goals Good   Potential Considerations Ability to learn/carryover information;Cooperation/participation level;Previous level of function;Severity of impairments;Family/community support;Other (comment)  Stimulability   SLP Home Exercise Plan LSVT-LOUD daily homework   Consulted and Agree with Plan of Care Patient          G-Codes - April 04, 2015 1056    Functional Assessment Tool Used Perceptual Voice Evaluation   Functional Limitations Voice   Voice Current Status (G9171) At least 60 percent but less than 80 percent impaired, limited or restricted   Voice Goal Status (G9172) At least 1 percent but less than 20 percent impaired, limited or restricted      Problem List Patient Active Problem List   Diagnosis Date Noted   Hepatitis A 04/02/2015   Allergic rhinitis 04/02/2015   Arthritis  04/02/2015   Narrowing of intervertebral disc space 04/02/2015   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   Accumulation of fluid in tissues 04/02/2015   Essential (primary) hypertension 04/02/2015   Borderline diabetes 04/02/2015   HLD (hyperlipidemia) 04/02/2015   BP (high blood pressure) 04/02/2015   Infected sebaceous cyst 04/02/2015   Hernia, inguinal 04/02/2015   Leg pain 04/02/2015   Lumbar canal stenosis 04/02/2015   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   Malignant melanoma 04/02/2015   Arthritis, degenerative 04/02/2015   Fatigue 04/02/2015   Idiopathic Parkinson's disease 04/02/2015   Plantar fasciitis 04/02/2015   CA of prostate 04/02/2015   Benign essential tremor 04/02/2015   Lower urinary tract infection 04/02/2015   Leroy Sea, MS/CCC- SLP  Lou Miner 04-04-15, 11:00  AM  [place] MAIN [facility] SERVICES [address] [place], [place], [address] Phone: [phone]   Fax:  [phone]

## 2015-04-03 NOTE — Therapy (Signed)
[place] MAIN [facility] SERVICES [address] [place], [place], [address] Phone: [phone]   Fax:  [phone]  Occupational Therapy Evaluation  Patient Details  Name: Roger Rodriguez MRN: [id number] Date of Birth: 07/18/1940 Referring Provider:  Vladimir Crofts, MD  Encounter Date: 04/02/2015      OT End of Session - 04/03/15 1543    Visit Number 1   Number of Visits 17   Date for OT Re-Evaluation 05/07/15   Authorization Type Medicare G code 1   OT Start Time 1100   OT Stop Time 1210   OT Time Calculation (min) 70 min   Activity Tolerance Patient tolerated treatment well   Behavior During Therapy [facility] for tasks assessed/performed      Past Medical History  Diagnosis Date  . Hyperlipidemia   . Hypertension   . Inguinal hernia   . Arthritis     OA  . Allergy     allergic rhinitis  . Cancer     Prostate  . Parkinson's disease     Past Surgical History  Procedure Laterality Date  . Rotator cuff tear  1993    Repaired  . H/o arthroscopy of right knee    . H/o radical protatectomy    . Vasectomy      History    There were no vitals filed for this visit.  Visit Diagnosis:  Muscle weakness (generalized) - Plan: Ot plan of care cert/re-cert  Lack of coordination - Plan: Ot plan of care cert/re-cert  Unsteady gait - Plan: Ot plan of care cert/re-cert  Self-care deficit for dressing and grooming - Plan: Ot plan of care cert/re-cert      Subjective Assessment - 04/02/15 1113    Subjective  Patient reports he has been diagnosed with Parkinson's about 2 years but feels he has had it for almost 3 years.  Reports he has trouble with hand to mouth patterns, dropping food just before reaching his mouth, reaching back behind his head for combing his hair and going up and down steps.   Feels his range of motion is worse on the left than right .   Currently in Pain? No/denies   Multiple Pain Sites No           [facility] OT  Assessment - 04/02/15 1115    Assessment   Diagnosis Parkinson's disease   Onset Date 04/12/13   Prior Therapy none   Balance Screen   Has the patient fallen in the past 6 months No   Has the patient had a decrease in activity level because of a fear of falling?  No   Is the patient reluctant to leave their home because of a fear of falling?  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    Prior Function   Level of Independence Independent   Vocation Retired  this last May from [employer].   ADL   Eating/Feeding Modified independent   Grooming Modified independent   Upper Body  Bathing Modified independent   Lower Body Bathing Modified independent   Upper Body Dressing Increased time   Lower Body Dressing Increased time   Toilet Tranfer Modified independent   Toileting - Clothing Manipulation Increased time   Tub/Shower Transfer Modified independent   Transfers/Ambulation Related to ADL's uses straight cane when going out in the community.   IADL   Prior Level of Function Shopping independence   Shopping Takes care of all shopping needs independently  needs cane at times, increased time to complete. Uses cart/   Prior Level of Function Light Housekeeping independent   Light Housekeeping All laundry must be done by others;Performs light daily tasks such as dishwashing, bed making   Prior Level of Function Meal Prep independent   Meal Prep Plans, prepares and serves adequate meals independently   [place]  own vehicle   Medication Management Is responsible for taking medication in correct dosages at correct time   Prior Level of Function Financial Management independent   Physiological scientist financial matters independently (budgets, writes checks, pays rent, bills goes to bank), collects and keeps track of income   Written Expression   Dominant Hand Right   Sensation   Light Touch Appears Intact   Stereognosis Appears Intact   Proprioception Appears Intact   Coordination   Gross Motor Movements are Fluid and Coordinated No   Fine Motor Movements are Fluid and Coordinated No   9 Hole Peg Test Right;Left   Right 9 Hole Peg Test 36   Left 9 Hole Peg Test 31   ROM / Strength   AROM / PROM / Strength AROM;Strength   AROM   Overall AROM Comments Right shoulder flexion to 140 degrees, left 125 degrees.  Limited ER on both sides with reaching behind head, left worse than right.  Right knee with edema this date as well as right calf area.    Strength   Overall Strength Comments Right UE strength 4/5 overall, left 4-/5 overall.     Hand Function   Right Hand Grip (lbs) 40   Right Hand Lateral Pinch 14 lbs   Right Hand 3 Point Pinch 12 lbs   Left Hand Grip (lbs) 44   Left Hand Lateral Pinch 14 lbs   Left 3 point pinch 13 lbs                         OT Education - 04/03/15 1543    Education provided Yes   Education Details LSVT BIG program and principles, role of OT   Person(s) Educated Patient   Methods Explanation   Comprehension Verbalized understanding             OT Long Term Goals - 04/02/15 1548    OT LONG TERM GOAL #1   Title Patient will improve gait speed and endurance and be able to ambulate 950 feet in 6 minutes to negotiate around the home and community safely.   Baseline 780 feet at evaluation.   Time 4   Period Weeks   Status New   OT LONG TERM GOAL #2   Title Patient will complete HEP for maximal daily exercises with modified  independence    Baseline no current program   Time 4   Period Weeks   Status New   OT LONG TERM GOAL #3   Title Patient will transfer from sit to stand without the use of arms safely and independently from a variety of  chairs/surfaces.   Baseline difficulty from low surfaces.   Time 4   Period Weeks   Status New   OT LONG TERM GOAL #4   Title Patient will demonstrate improved balance for functional daily tasks with a score on Berg Balance test greater than 50/56.   Baseline 45/56 at initial evaluation.   Time 4   Period Weeks   Status New   OT LONG TERM GOAL #5   Title Patient will demonstrate a decrease in hesistation and freezing behaviors with initiation and termination of gait and with turning behaviors as evidenced of a score lower than 10 on the Freezing of Gait Questionairre.   Baseline score of 14 on FOG at eval.    Time 4   Period Weeks   Status New   Long Term Additional Goals   Additional Long Term Goals Yes   OT LONG TERM GOAL #6   Title Patient will self feed with minimal spillage using utensils and complete hand to mouth patterns with 90% accuracy or greater.    Baseline spills each meal and unable to control utensil to mouth.   Time 4   Period Weeks   Status New   OT LONG TERM GOAL #7   Title Patient will demonstrate improved external rotation of BUE to comb the back of his hair independently.   Baseline difficulty reaching back of head at eval.   Time 4   Period Weeks   Status New   OT LONG TERM GOAL #8   Title Patient will negotiate 5 steps safely demonstrating a reciprocal LE patterns with good form.   Baseline decreased balance and safety with stair negotiation at eval.   Time 4   Period Weeks   Status New               Plan - 04/03/15 1611    Clinical Impression Statement Patient is a 74 yo male diagnosed with Parkinson's disease and was referred by his physician for LSVT BIG program. Patient presents with decreased step length with gait  patterns, decreased reciprocal arm swing, decreased balance, decreased coordination, muscle strength, and decreased transfers from low surfaces, decreased ROM for grooming tasks, impaired hand to mouth patterns and hesitation/mild freezing behaviors with functional mobility and turns which affect his ability to perform daily tasks. The patient is judged to be an excellent candidate for the LSVT BIG program. He would benefit from and was referred for the LSVT BIG program which is an intensive program designed specifically for Parkinson's patients with a focus on increasing amplitude and speed of movements, improving self care and daily tasks and providing patients with daily exercises to improve overall function. It is recommended that the patient receive the LSVT BIG program which is comprised of 16 intensive sessions (4 times per week for 4 weeks, one hour sessions). Prognosis for improvement is good based on his motivation and strong family support. LSVT BIG has been documented in the literature as efficacious for individuals with Parkinson's disease.       Pt will benefit from skilled therapeutic intervention in order to improve on the following deficits (Retired) Decreased coordination;Decreased range of motion;Difficulty walking;Decreased endurance;Decreased balance;Impaired UE functional use;Decreased mobility;Decreased strength   Rehab Potential Good   OT Frequency 4x / week   OT Duration 4 weeks   OT Treatment/Interventions Self-care/ADL training;Therapeutic exercise;Functional Mobility Training;Patient/family education;Neuromuscular education;Balance training;DME and/or AE instruction;Gait Training;Stair Training   Consulted and Agree with Plan of Care Patient  G-Codes - 04/02/15 1603    Functional Assessment Tool Used clinical judgment, 5 times sit to stand, 6 minute walk test, Freezing of gait questionairre, BERG balance test   Functional Limitation Mobility: Walking and moving  around   Mobility: Walking and Moving Around Current Status [phone]) At least 40 percent but less than 60 percent impaired, limited or restricted   Mobility: Walking and Moving Around Goal Status [phone]) At least 20 percent but less than 40 percent impaired, limited or restricted      Problem List Patient Active Problem List   Diagnosis Date Noted  . Hepatitis A 04/02/2015  . Allergic rhinitis 04/02/2015  . Arthritis 04/02/2015  . Narrowing of intervertebral disc space 04/02/2015  . 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 Accumulation of fluid in tissues 04/02/2015  . Essential (primary) hypertension 04/02/2015  . Borderline diabetes 04/02/2015  . HLD (hyperlipidemia) 04/02/2015  . BP (high blood pressure) 04/02/2015  . Infected sebaceous cyst 04/02/2015  . Hernia, inguinal 04/02/2015  . Leg pain 04/02/2015  . Lumbar canal stenosis 04/02/2015  . 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 Malignant melanoma 04/02/2015  . Arthritis, degenerative 04/02/2015  . Fatigue 04/02/2015  . Idiopathic Parkinson's disease 04/02/2015  . Plantar fasciitis 04/02/2015  . CA of prostate 04/02/2015  . Benign essential tremor 04/02/2015  . Lower urinary tract infection 04/02/2015   Amy T Tomasita Morrow, OTR/L, CLT Lovett,Amy 04/03/2015, 4:34 PM  [place] MAIN [facility] SERVICES [address] [place], [place], [address] Phone: [phone]   Fax:  [phone]

## 2015-04-08 ENCOUNTER — Ambulatory Visit: Payer: PPO | Admitting: Occupational Therapy

## 2015-04-08 ENCOUNTER — Ambulatory Visit: Payer: PPO | Admitting: Speech Pathology

## 2015-04-08 ENCOUNTER — Encounter: Payer: Self-pay | Admitting: Speech Pathology

## 2015-04-08 DIAGNOSIS — Z741 Need for assistance with personal care: Secondary | ICD-10-CM

## 2015-04-08 DIAGNOSIS — M6281 Muscle weakness (generalized): Secondary | ICD-10-CM

## 2015-04-08 DIAGNOSIS — R460 Very low level of personal hygiene: Secondary | ICD-10-CM

## 2015-04-08 DIAGNOSIS — R279 Unspecified lack of coordination: Secondary | ICD-10-CM

## 2015-04-08 DIAGNOSIS — R490 Dysphonia: Secondary | ICD-10-CM

## 2015-04-08 DIAGNOSIS — R2681 Unsteadiness on feet: Secondary | ICD-10-CM

## 2015-04-08 NOTE — Therapy (Signed)
[place] MAIN [facility] SERVICES [address] [place], [place], [address] Phone: [phone]   Fax:  [phone]  Occupational Therapy Treatment  Patient Details  Name: Roger Rodriguez MRN: [id number] Date of Birth: 1940-09-20 Referring Provider:  Vladimir Crofts, MD  Encounter Date: 04/08/2015      OT End of Session - 04/08/15 1603    Visit Number 2   Number of Visits 17   Date for OT Re-Evaluation 05/07/15   Authorization Type Medicare G code 2   OT Start Time 1101   OT Stop Time 1200   OT Time Calculation (min) 59 min   Activity Tolerance Patient tolerated treatment well   Behavior During Therapy [facility] for tasks assessed/performed      Past Medical History  Diagnosis Date  . Hyperlipidemia   . Hypertension   . Inguinal hernia   . Arthritis     OA  . Allergy     allergic rhinitis  . Cancer     Prostate  . Parkinson's disease     Past Surgical History  Procedure Laterality Date  . Rotator cuff tear  1993    Repaired  . H/o arthroscopy of right knee    . H/o radical protatectomy    . Vasectomy      History    There were no vitals filed for this visit.  Visit Diagnosis:  Muscle weakness (generalized)  Lack of coordination  Unsteady gait  Self-care deficit for dressing and grooming      Subjective Assessment - 04/08/15 1600    Subjective  Patient reports he is ready to get started with his program, wants to be able to do well with exercises.  "The other girl said I did great today, I told her I couldn't have done great on my first day!"   Patient Stated Goals Patient reports he wants to walk better, have good balance and to be independent.    Currently in Pain? No/denies   Multiple Pain Sites No                      OT Treatments/Exercises (OP) - 04/08/15 1602    Neurological Re-education Exercises   Other Exercises 1 Patient seen for initial instruction of LSVT BIG exercises:  LSVT Daily Session  Maximal Daily Exercises: Sustained movements are designed to rescale the amplitude of movement output for generalization to daily functional activities. Performed as follows for 1 set of 10 repetitions each: Multi directional sustained movements- 1) Floor to ceiling, 2) Side to side. Multi directional Repetitive movements performed in standing and are designed to provide retraining effort needed for sustained muscle activation in tasks Performed as follows: 3) Step and reach forward, 4) Step and Reach Backwards, 5) Step and reach sideways, 6) Rock and reach forward/backward, 7) Rock and reach sideways. Sit to stand from mat table on lowest setting with cues for weight shift, technique and CGA for 5 reps for 2 sets. Patient seen for functional mobility tasks this date with emphasis on gait speed, length of steps with short distance ambulation. Patient requires cues for BIG movements and cadenceAll standing exercises performed this date with CGA, verbal and tactile cues as needed.    Other Exercises 2 Functional mobility utilizing LSVT BIG techniques for 2 trials of 250 feet, cues for increasing amplitude of steps as well as reciprocal arm swing.  OT Education - 04/08/15 1603    Education provided Yes   Education Details LSVT BIG maximal daily exercises   Person(s) Educated Patient   Methods Explanation;Demonstration;Tactile cues;Verbal cues   Comprehension Verbal cues required;Returned demonstration;Verbalized understanding;Tactile cues required             OT Long Term Goals - 04/02/15 1548    OT LONG TERM GOAL #1   Title Patient will improve gait speed and endurance and be able to ambulate 950 feet in 6 minutes to negotiate around the home and community safely.   Baseline 780 feet at evaluation.   Time 4   Period Weeks   Status New   OT LONG TERM GOAL #2   Title Patient will complete HEP for maximal daily exercises with modified independence    Baseline no  current program   Time 4   Period Weeks   Status New   OT LONG TERM GOAL #3   Title Patient will transfer from sit to stand without the use of arms safely and independently from a variety of chairs/surfaces.   Baseline difficulty from low surfaces.   Time 4   Period Weeks   Status New   OT LONG TERM GOAL #4   Title Patient will demonstrate improved balance for functional daily tasks with a score on Berg Balance test greater than 50/56.   Baseline 45/56 at initial evaluation.   Time 4   Period Weeks   Status New   OT LONG TERM GOAL #5   Title Patient will demonstrate a decrease in hesistation and freezing behaviors with initiation and termination of gait and with turning behaviors as evidenced of a score lower than 10 on the Freezing of Gait Questionairre.   Baseline score of 14 on FOG at eval.    Time 4   Period Weeks   Status New   Long Term Additional Goals   Additional Long Term Goals Yes   OT LONG TERM GOAL #6   Title Patient will self feed with minimal spillage using utensils and complete hand to mouth patterns with 90% accuracy or greater.    Baseline spills each meal and unable to control utensil to mouth.   Time 4   Period Weeks   Status New   OT LONG TERM GOAL #7   Title Patient will demonstrate improved external rotation of BUE to comb the back of his hair independently.   Baseline difficulty reaching back of head at eval.   Time 4   Period Weeks   Status New   OT LONG TERM GOAL #8   Title Patient will negotiate 5 steps safely demonstrating a reciprocal LE patterns with good form.   Baseline decreased balance and safety with stair negotiation at eval.   Time 4   Period Weeks   Status New               Plan - 04/08/15 1604    Clinical Impression Statement Patient seen this date for initial instruction on LSVT BIG maximal daily exercises.  He was able to complete exercises with CGA and verbal/tactile cues from therapist.  Will plan to reinstruct next  session prior to issuing exercises to perform at home as a part of his home program.  He was able to work on functional mobility on flat, even surfaces without his cane and cues for displaying a reciprocal arm movement.     Pt will benefit from skilled therapeutic intervention in order to improve on the  following deficits (Retired) Decreased coordination;Decreased range of motion;Difficulty walking;Decreased endurance;Decreased balance;Impaired UE functional use;Decreased mobility;Decreased strength   Rehab Potential Good   OT Frequency 4x / week   OT Duration 4 weeks   OT Treatment/Interventions Self-care/ADL training;Therapeutic exercise;Functional Mobility Training;Patient/family education;Neuromuscular education;Balance training;DME and/or AE instruction;Gait Training;Stair Training   Consulted and Agree with Plan of Care Patient        Problem List Patient Active Problem List   Diagnosis Date Noted  . Hepatitis A 04/02/2015  . Allergic rhinitis 04/02/2015  . Arthritis 04/02/2015  . Narrowing of intervertebral disc space 04/02/2015  . 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 Accumulation of fluid in tissues 04/02/2015  . Essential (primary) hypertension 04/02/2015  . Borderline diabetes 04/02/2015  . HLD (hyperlipidemia) 04/02/2015  . BP (high blood pressure) 04/02/2015  . Infected sebaceous cyst 04/02/2015  . Hernia, inguinal 04/02/2015  . Leg pain 04/02/2015  . Lumbar canal stenosis 04/02/2015  . 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 Malignant melanoma 04/02/2015  . Arthritis, degenerative 04/02/2015  . Fatigue 04/02/2015  . Idiopathic Parkinson's disease 04/02/2015  . Plantar fasciitis 04/02/2015  . CA of prostate 04/02/2015  . Benign essential tremor 04/02/2015  . Lower urinary tract infection 04/02/2015   Amy T Tomasita Morrow, OTR/L, CLT Lovett,Amy 04/08/2015, 4:30 PM  [place] MAIN [facility] SERVICES [address]  [place], [place], [address] Phone: [phone]   Fax:  [phone]

## 2015-04-08 NOTE — Therapy (Signed)
[place] MAIN [facility] SERVICES [address] [place], [place], [address] Phone: [phone]   Fax:  [phone]  Speech Language Pathology Treatment  Patient Details  Name: Roger Rodriguez MRN: [id number] Date of Birth: 27-Feb-1941 Referring Provider:  Vladimir Crofts, MD  Encounter Date: 04/08/2015      End of Session - 04/08/15 1110    Visit Number 2   Number of Visits 17   Date for SLP Re-Evaluation 05/10/15   SLP Start Time 1010   SLP Stop Time  1056   SLP Time Calculation (min) 46 min   Activity Tolerance Patient tolerated treatment well      Past Medical History  Diagnosis Date  . Hyperlipidemia   . Hypertension   . Inguinal hernia   . Arthritis     OA  . Allergy     allergic rhinitis  . Cancer     Prostate  . Parkinson's disease     Past Surgical History  Procedure Laterality Date  . Rotator cuff tear  1993    Repaired  . H/o arthroscopy of right knee    . H/o radical protatectomy    . Vasectomy      History    There were no vitals filed for this visit.  Visit Diagnosis: Dysphonia      Subjective Assessment - 04/08/15 1109    Subjective Patient states that he has always been "soft spoken".   Currently in Pain? No/denies               ADULT SLP TREATMENT - 04/08/15 0001    General Information   Behavior/Cognition Alert;Cooperative;Pleasant mood   Treatment Provided   Treatment provided Cognitive-Linquistic   Pain Assessment   Pain Assessment No/denies pain   Cognitive-Linquistic Treatment   Treatment focused on Voice   Skilled Treatment Daily Task #1: Average 18 seconds, 80 dB. Daily Task 2: Highs: 15 high pitched "ah" given max cues. Lows: 15 low pitched "ah" given max cues. Daily task #3: Average 65 dB.  Hierarchal speech loudness drill: Read words/phrases, 65 dB. Homework: assignments given.  Off the cuff remarks: average 60 dB.     Assessment / Recommendations / Plan   Plan Continue with  current plan of care   Progression Toward Goals   Progression toward goals Progressing toward goals          SLP Education - 04/08/15 1110    Education provided Yes   Education Details LSVT LOUD   Methods Explanation;Demonstration;Verbal cues   Comprehension Verbalized understanding;Returned demonstration;Verbal cues required;Need further instruction            SLP Long Term Goals - 04/03/15 1055    SLP LONG TERM GOAL #1   Title The patient will complete Daily Tasks (Maximum duration "ah", High/Lows, and Functional Phrases) at average loudness of 80 dB and with loud, good quality voice.    Time 4   Period Weeks   Status New   SLP LONG TERM GOAL #2   Title The patient will complete Hierarchal Speech Loudness reading drills (words/phrases, sentences, and paragraph) at average 75 dB and with loud, good quality voice.     Time 4   Period Weeks   Status New   SLP LONG TERM GOAL #3   Title The patient will complete homework daily.   Time 4   Period Weeks   Status New   SLP LONG TERM GOAL #4   Title The patient will  participate in conversation, maintaining average loudness of 75 dB and loud, good quality voice.   Time 4   Period Weeks   Status New          Plan - 04/08/15 1111    Clinical Impression Statement The patient is completing daily tasks and hierarchal speech drill tasks with loud, good quality voice given mod-to-max SLP cues for loudness and quality.     Speech Therapy Frequency 4x / week   Duration 4 weeks   Treatment/Interventions Other (comment)  LSVT-LOUD   Potential to Achieve Goals Good   Potential Considerations Ability to learn/carryover information;Cooperation/participation level;Previous level of function;Severity of impairments;Family/community support;Other (comment)   SLP Home Exercise Plan LSVT-LOUD daily homework   Consulted and Agree with Plan of Care Patient        Problem List Patient Active Problem List   Diagnosis Date Noted  .  Hepatitis A 04/02/2015  . Allergic rhinitis 04/02/2015  . Arthritis 04/02/2015  . Narrowing of intervertebral disc space 04/02/2015  . 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 Accumulation of fluid in tissues 04/02/2015  . Essential (primary) hypertension 04/02/2015  . Borderline diabetes 04/02/2015  . HLD (hyperlipidemia) 04/02/2015  . BP (high blood pressure) 04/02/2015  . Infected sebaceous cyst 04/02/2015  . Hernia, inguinal 04/02/2015  . Leg pain 04/02/2015  . Lumbar canal stenosis 04/02/2015  . 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 Malignant melanoma 04/02/2015  . Arthritis, degenerative 04/02/2015  . Fatigue 04/02/2015  . Idiopathic Parkinson's disease 04/02/2015  . Plantar fasciitis 04/02/2015  . CA of prostate 04/02/2015  . Benign essential tremor 04/02/2015  . Lower urinary tract infection 04/02/2015   Leroy Sea, MS/CCC- SLP  Lou Miner 04/08/2015, 11:12 AM  [place] MAIN [facility] SERVICES [address] [place], [place], [address] Phone: [phone]   Fax:  [phone]

## 2015-04-09 ENCOUNTER — Ambulatory Visit: Payer: PPO | Admitting: Speech Pathology

## 2015-04-09 ENCOUNTER — Ambulatory Visit: Payer: PPO | Admitting: Occupational Therapy

## 2015-04-10 ENCOUNTER — Ambulatory Visit: Payer: PPO | Admitting: Speech Pathology

## 2015-04-10 ENCOUNTER — Encounter: Payer: Self-pay | Admitting: Speech Pathology

## 2015-04-10 ENCOUNTER — Ambulatory Visit: Payer: PPO | Admitting: Occupational Therapy

## 2015-04-10 DIAGNOSIS — M6281 Muscle weakness (generalized): Secondary | ICD-10-CM

## 2015-04-10 DIAGNOSIS — R490 Dysphonia: Secondary | ICD-10-CM

## 2015-04-10 DIAGNOSIS — R2681 Unsteadiness on feet: Secondary | ICD-10-CM

## 2015-04-10 DIAGNOSIS — R279 Unspecified lack of coordination: Secondary | ICD-10-CM

## 2015-04-10 DIAGNOSIS — R460 Very low level of personal hygiene: Secondary | ICD-10-CM

## 2015-04-10 DIAGNOSIS — Z741 Need for assistance with personal care: Secondary | ICD-10-CM

## 2015-04-10 NOTE — Therapy (Signed)
[place] MAIN [facility] SERVICES [address] [place], [place], [address] Phone: [phone]   Fax:  [phone]  Speech Language Pathology Treatment  Patient Details  Name: Roger Rodriguez MRN: [id number] Date of Birth: 04-23-1941 Referring Provider:  Vladimir Crofts, MD  Encounter Date: 04/10/2015      End of Session - 04/10/15 1103    Visit Number 3   Number of Visits 17   Date for SLP Re-Evaluation 05/10/15   SLP Start Time 1003   SLP Stop Time  1058   SLP Time Calculation (min) 55 min   Activity Tolerance Patient tolerated treatment well      Past Medical History  Diagnosis Date  . Hyperlipidemia   . Hypertension   . Inguinal hernia   . Arthritis     OA  . Allergy     allergic rhinitis  . Cancer     Prostate  . Parkinson's disease     Past Surgical History  Procedure Laterality Date  . Rotator cuff tear  1993    Repaired  . H/o arthroscopy of right knee    . H/o radical protatectomy    . Vasectomy      History    There were no vitals filed for this visit.  Visit Diagnosis: Dysphonia      Subjective Assessment - 04/10/15 1102    Subjective Patient reports that people have remarked on his fast and quiet speech.   Currently in Pain? No/denies               ADULT SLP TREATMENT - 04/10/15 0001    General Information   Behavior/Cognition Alert;Cooperative;Pleasant mood   Treatment Provided   Treatment provided Cognitive-Linquistic   Pain Assessment   Pain Assessment No/denies pain   Cognitive-Linquistic Treatment   Treatment focused on Voice   Skilled Treatment Daily Task #1: Average 14 seconds, 82 dB. Daily Task 2: Highs: 15 high pitched "ah" given mod cues. Lows: 15 low pitched "ah" given mod cues. Daily task #3: Average 68 dB.  Hierarchal speech loudness drill: Read words/sentences, 68 dB. Homework: assignments completed.  Off the cuff remarks: average 65 dB.   Assessment / Recommendations / Plan    Plan Continue with current plan of care   Progression Toward Goals   Progression toward goals Progressing toward goals          SLP Education - 04/10/15 1102    Education provided Yes   Education Details LSVT-LOUD   Person(s) Educated Patient   Methods Explanation;Demonstration;Verbal cues;Handout   Comprehension Verbalized understanding;Returned demonstration;Verbal cues required;Need further instruction            SLP Long Term Goals - 04/03/15 1055    SLP LONG TERM GOAL #1   Title The patient will complete Daily Tasks (Maximum duration "ah", High/Lows, and Functional Phrases) at average loudness of 80 dB and with loud, good quality voice.    Time 4   Period Weeks   Status New   SLP LONG TERM GOAL #2   Title The patient will complete Hierarchal Speech Loudness reading drills (words/phrases, sentences, and paragraph) at average 75 dB and with loud, good quality voice.     Time 4   Period Weeks   Status New   SLP LONG TERM GOAL #3   Title The patient will complete homework daily.   Time 4   Period Weeks   Status New   SLP LONG TERM GOAL #4  Title The patient will participate in conversation, maintaining average loudness of 75 dB and loud, good quality voice.   Time 4   Period Weeks   Status New          Plan - 04/10/15 1103    Clinical Impression Statement The patient is completing daily tasks and hierarchal speech drill tasks with loud, good quality voice given mod-to-max SLP cues for loudness and quality.     Speech Therapy Frequency 4x / week   Duration 4 weeks   Treatment/Interventions Other (comment)  LSVT-LOUD   Potential to Achieve Goals Good   Potential Considerations Ability to learn/carryover information;Cooperation/participation level;Previous level of function;Severity of impairments;Family/community support;Other (comment)   SLP Home Exercise Plan LSVT-LOUD daily homework   Consulted and Agree with Plan of Care Patient        Problem  List Patient Active Problem List   Diagnosis Date Noted  . Hepatitis A 04/02/2015  . Allergic rhinitis 04/02/2015  . Arthritis 04/02/2015  . Narrowing of intervertebral disc space 04/02/2015  . 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 Accumulation of fluid in tissues 04/02/2015  . Essential (primary) hypertension 04/02/2015  . Borderline diabetes 04/02/2015  . HLD (hyperlipidemia) 04/02/2015  . BP (high blood pressure) 04/02/2015  . Infected sebaceous cyst 04/02/2015  . Hernia, inguinal 04/02/2015  . Leg pain 04/02/2015  . Lumbar canal stenosis 04/02/2015  . 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 Malignant melanoma 04/02/2015  . Arthritis, degenerative 04/02/2015  . Fatigue 04/02/2015  . Idiopathic Parkinson's disease 04/02/2015  . Plantar fasciitis 04/02/2015  . CA of prostate 04/02/2015  . Benign essential tremor 04/02/2015  . Lower urinary tract infection 04/02/2015   Leroy Sea, MS/CCC- SLP  Lou Miner 04/10/2015, 11:04 AM  [place] MAIN [facility] SERVICES [address] [place], [place], [address] Phone: [phone]   Fax:  [phone]

## 2015-04-11 ENCOUNTER — Encounter: Payer: Self-pay | Admitting: Occupational Therapy

## 2015-04-11 ENCOUNTER — Ambulatory Visit: Payer: PPO | Admitting: Occupational Therapy

## 2015-04-11 ENCOUNTER — Ambulatory Visit: Payer: PPO | Admitting: Speech Pathology

## 2015-04-11 ENCOUNTER — Encounter: Payer: Self-pay | Admitting: Speech Pathology

## 2015-04-11 DIAGNOSIS — R279 Unspecified lack of coordination: Secondary | ICD-10-CM

## 2015-04-11 DIAGNOSIS — R460 Very low level of personal hygiene: Secondary | ICD-10-CM

## 2015-04-11 DIAGNOSIS — R490 Dysphonia: Secondary | ICD-10-CM | POA: Diagnosis not present

## 2015-04-11 DIAGNOSIS — Z741 Need for assistance with personal care: Secondary | ICD-10-CM

## 2015-04-11 DIAGNOSIS — M6281 Muscle weakness (generalized): Secondary | ICD-10-CM

## 2015-04-11 DIAGNOSIS — R2681 Unsteadiness on feet: Secondary | ICD-10-CM

## 2015-04-11 NOTE — Therapy (Signed)
[place] MAIN [facility] SERVICES [address] [place], [place], [address] Phone: [phone]   Fax:  [phone]  Speech Language Pathology Treatment  Patient Details  Name: Roger Rodriguez MRN: [id number] Date of Birth: 1941/03/26 Referring Provider:  Vladimir Crofts, MD  Encounter Date: 04/11/2015      End of Session - 04/11/15 1424    Visit Number 4   Number of Visits 17   Date for SLP Re-Evaluation 05/10/15   SLP Start Time 1012   SLP Stop Time  1100   SLP Time Calculation (min) 48 min   Activity Tolerance Patient tolerated treatment well      Past Medical History  Diagnosis Date  . Hyperlipidemia   . Hypertension   . Inguinal hernia   . Arthritis     OA  . Allergy     allergic rhinitis  . Cancer     Prostate  . Parkinson's disease     Past Surgical History  Procedure Laterality Date  . Rotator cuff tear  1993    Repaired  . H/o arthroscopy of right knee    . H/o radical protatectomy    . Vasectomy      History    There were no vitals filed for this visit.  Visit Diagnosis: Dysphonia      Subjective Assessment - 04/11/15 1423    Subjective Patient reports that he feels like he is "hollering" when is at the targeted loudness.   Currently in Pain? No/denies               ADULT SLP TREATMENT - 04/11/15 0001    General Information   Behavior/Cognition Alert;Cooperative;Pleasant mood   Treatment Provided   Treatment provided Cognitive-Linquistic   Pain Assessment   Pain Assessment No/denies pain   Cognitive-Linquistic Treatment   Treatment focused on Voice   Skilled Treatment Daily Task #1: Average 15 seconds, 85 dB. Daily Task 2: Highs: 15 high pitched "ah" given mod cues. Lows: 15 low pitched "ah" given mod cues. Daily task #3: Average 70 dB.  Hierarchal speech loudness drill: Read words/sentences, 70 dB. Generate phrase to complete cognitive-linguistic task, 73 dB.  Homework: assignments completed.   Off the cuff remarks: average 65 dB.   Assessment / Recommendations / Plan   Plan Continue with current plan of care   Progression Toward Goals   Progression toward goals Progressing toward goals          SLP Education - 04/11/15 1424    Education provided Yes   Education Details LSVT-LOUD   Person(s) Educated Patient   Methods Explanation;Demonstration;Verbal cues;Handout   Comprehension Verbalized understanding;Returned demonstration;Verbal cues required;Need further instruction            SLP Long Term Goals - 04/03/15 1055    SLP LONG TERM GOAL #1   Title The patient will complete Daily Tasks (Maximum duration "ah", High/Lows, and Functional Phrases) at average loudness of 80 dB and with loud, good quality voice.    Time 4   Period Weeks   Status New   SLP LONG TERM GOAL #2   Title The patient will complete Hierarchal Speech Loudness reading drills (words/phrases, sentences, and paragraph) at average 75 dB and with loud, good quality voice.     Time 4   Period Weeks   Status New   SLP LONG TERM GOAL #3   Title The patient will complete homework daily.   Time 4   Period Weeks  Status New   SLP LONG TERM GOAL #4   Title The patient will participate in conversation, maintaining average loudness of 75 dB and loud, good quality voice.   Time 4   Period Weeks   Status New          Plan - 04/11/15 1425    Clinical Impression Statement The patient is completing daily tasks and hierarchal speech drill tasks with loud, good quality voice given mod SLP cues.  Towards the end of today's session the patient was becoming more tolerant of how loud he is being asked to talk.   Speech Therapy Frequency 4x / week   Duration 4 weeks   Treatment/Interventions Other (comment)  LSVT-LOUD   Potential to Achieve Goals Good   Potential Considerations Ability to learn/carryover information;Cooperation/participation level;Previous level of function;Severity of  impairments;Family/community support;Other (comment)   SLP Home Exercise Plan LSVT-LOUD daily homework   Consulted and Agree with Plan of Care Patient        Problem List Patient Active Problem List   Diagnosis Date Noted  . Hepatitis A 04/02/2015  . Allergic rhinitis 04/02/2015  . Arthritis 04/02/2015  . Narrowing of intervertebral disc space 04/02/2015  . 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 Accumulation of fluid in tissues 04/02/2015  . Essential (primary) hypertension 04/02/2015  . Borderline diabetes 04/02/2015  . HLD (hyperlipidemia) 04/02/2015  . BP (high blood pressure) 04/02/2015  . Infected sebaceous cyst 04/02/2015  . Hernia, inguinal 04/02/2015  . Leg pain 04/02/2015  . Lumbar canal stenosis 04/02/2015  . 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 Malignant melanoma 04/02/2015  . Arthritis, degenerative 04/02/2015  . Fatigue 04/02/2015  . Idiopathic Parkinson's disease 04/02/2015  . Plantar fasciitis 04/02/2015  . CA of prostate 04/02/2015  . Benign essential tremor 04/02/2015  . Lower urinary tract infection 04/02/2015   Leroy Sea, MS/CCC- SLP  Lou Miner 04/11/2015, 2:26 PM  [place] MAIN [facility] SERVICES [address] [place], [place], [address] Phone: [phone]   Fax:  [phone]

## 2015-04-11 NOTE — Therapy (Signed)
[place] MAIN [facility] SERVICES [address] [place], [place], [address] Phone: [phone]   Fax:  [phone]  Occupational Therapy Treatment  Patient Details  Name: Roger Rodriguez MRN: [id number] Date of Birth: 11/10/1940 Referring Provider:  Vladimir Crofts, MD  Encounter Date: 04/10/2015      OT End of Session - 04/10/15 2005    Visit Number 3   Number of Visits 17   Date for OT Re-Evaluation 05/07/15   Authorization Type Medicare G code 3   OT Start Time 1100   OT Stop Time 1158   OT Time Calculation (min) 58 min   Activity Tolerance Patient tolerated treatment well   Behavior During Therapy [facility] for tasks assessed/performed      Past Medical History  Diagnosis Date  . Hyperlipidemia   . Hypertension   . Inguinal hernia   . Arthritis     OA  . Allergy     allergic rhinitis  . Cancer     Prostate  . Parkinson's disease     Past Surgical History  Procedure Laterality Date  . Rotator cuff tear  1993    Repaired  . H/o arthroscopy of right knee    . H/o radical protatectomy    . Vasectomy      History    There were no vitals filed for this visit.  Visit Diagnosis:  Muscle weakness (generalized)  Lack of coordination  Unsteady gait  Self-care deficit for dressing and grooming      Subjective Assessment - 04/10/15 1959    Subjective  Patient reports he had to be the caregiver for his wife last night who has dementia.  She usually has 24 hour care but he tries to take a couple of nights to help.  Was not able to do exercises.   Patient Stated Goals Patient reports he wants to walk better, have good balance and to be independent.    Currently in Pain? No/denies   Multiple Pain Sites No                      OT Treatments/Exercises (OP) - 04/10/15 2000    ADLs   ADL Comments Functional component tasks of sit to stand without the use of arms, hand to mouth patterns with spoon, reaching the back of  the head on the left.   Neurological Re-education Exercises   Other Exercises 1 Patient seen for initial instruction of LSVT BIG exercises: LSVT Daily Session Maximal Daily Exercises: Sustained movements are designed to rescale the amplitude of movement output for generalization to daily functional activities. Performed as follows for 1 set of 10 repetitions each: Multi directional sustained movements- 1) Floor to ceiling, 2) Side to side. Multi directional Repetitive movements performed in standing and are designed to provide retraining effort needed for sustained muscle activation in tasks Performed as follows: 3) Step and reach forward, 4) Step and Reach Backwards, 5) Step and reach sideways, 6) Rock and reach forward/backward, 7) Rock and reach sideways. Sit to stand from mat table on lowest setting with cues for weight shift, technique and CGA for 5 reps for 2 sets. Patient seen for functional mobility tasks this date with emphasis on gait speed, length of steps with short distance ambulation. Patient requires cues for BIG movements and cadenceAll standing exercises performed this date with CGA, verbal and tactile cues as needed   Other Exercises 2 Functional mobility utilizing LSVT BIG techniques for  3 trials of 250 feet, cues for increasing amplitude of steps as well as reciprocal arm swing.                 OT Education - 04/10/15 2004    Education provided Yes   Education Details LSVT maximal daily exercises, HEP   Person(s) Educated Patient   Methods Explanation;Demonstration;Verbal cues   Comprehension Verbal cues required;Returned demonstration;Verbalized understanding             OT Long Term Goals - 04/02/15 1548    OT LONG TERM GOAL #1   Title Patient will improve gait speed and endurance and be able to ambulate 950 feet in 6 minutes to negotiate around the home and community safely.   Baseline 780 feet at evaluation.   Time 4   Period Weeks   Status New   OT LONG  TERM GOAL #2   Title Patient will complete HEP for maximal daily exercises with modified independence    Baseline no current program   Time 4   Period Weeks   Status New   OT LONG TERM GOAL #3   Title Patient will transfer from sit to stand without the use of arms safely and independently from a variety of chairs/surfaces.   Baseline difficulty from low surfaces.   Time 4   Period Weeks   Status New   OT LONG TERM GOAL #4   Title Patient will demonstrate improved balance for functional daily tasks with a score on Berg Balance test greater than 50/56.   Baseline 45/56 at initial evaluation.   Time 4   Period Weeks   Status New   OT LONG TERM GOAL #5   Title Patient will demonstrate a decrease in hesistation and freezing behaviors with initiation and termination of gait and with turning behaviors as evidenced of a score lower than 10 on the Freezing of Gait Questionairre.   Baseline score of 14 on FOG at eval.    Time 4   Period Weeks   Status New   Long Term Additional Goals   Additional Long Term Goals Yes   OT LONG TERM GOAL #6   Title Patient will self feed with minimal spillage using utensils and complete hand to mouth patterns with 90% accuracy or greater.    Baseline spills each meal and unable to control utensil to mouth.   Time 4   Period Weeks   Status New   OT LONG TERM GOAL #7   Title Patient will demonstrate improved external rotation of BUE to comb the back of his hair independently.   Baseline difficulty reaching back of head at eval.   Time 4   Period Weeks   Status New   OT LONG TERM GOAL #8   Title Patient will negotiate 5 steps safely demonstrating a reciprocal LE patterns with good form.   Baseline decreased balance and safety with stair negotiation at eval.   Time 4   Period Weeks   Status New               Plan - 04/10/15 2005    Clinical Impression Statement Patient requires CGA for most exercises in standing, since he will not have  someone around at home to help with exercise, he was instructed on modifications of exercises using a chair.  He demonstrates understanding.  Cues this date for arm movements with exercises.  He chose to work on functional mobility this date without the use of a cane.  CGA provided but all functional mobility was performed on even surfaces.    Pt will benefit from skilled therapeutic intervention in order to improve on the following deficits (Retired) Decreased coordination;Decreased range of motion;Difficulty walking;Decreased endurance;Decreased balance;Impaired UE functional use;Decreased mobility;Decreased strength   Rehab Potential Good   OT Frequency 4x / week   OT Duration 4 weeks   OT Treatment/Interventions Self-care/ADL training;Therapeutic exercise;Functional Mobility Training;Patient/family education;Neuromuscular education;Balance training;DME and/or AE instruction;Gait Training;Stair Training   OT Home Exercise Plan LSVT BIG exercises, issued written/pictorial handout.   Consulted and Agree with Plan of Care Patient        Problem List Patient Active Problem List   Diagnosis Date Noted  . Hepatitis A 04/02/2015  . Allergic rhinitis 04/02/2015  . Arthritis 04/02/2015  . Narrowing of intervertebral disc space 04/02/2015  . 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 Accumulation of fluid in tissues 04/02/2015  . Essential (primary) hypertension 04/02/2015  . Borderline diabetes 04/02/2015  . HLD (hyperlipidemia) 04/02/2015  . BP (high blood pressure) 04/02/2015  . Infected sebaceous cyst 04/02/2015  . Hernia, inguinal 04/02/2015  . Leg pain 04/02/2015  . Lumbar canal stenosis 04/02/2015  . 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 Malignant melanoma 04/02/2015  . Arthritis, degenerative 04/02/2015  . Fatigue 04/02/2015  . Idiopathic Parkinson's disease 04/02/2015  . Plantar fasciitis 04/02/2015  . CA of prostate 04/02/2015  . Benign essential tremor 04/02/2015  .  Lower urinary tract infection 04/02/2015   Amy T Tomasita Morrow, OTR/L, CLT Lovett,Amy 04/11/2015, 8:09 PM  [place] MAIN [facility] SERVICES [address] [place], [place], [address] Phone: [phone]   Fax:  [phone]

## 2015-04-12 NOTE — Therapy (Signed)
[place] MAIN [facility] SERVICES [address] [place], [place], [address] Phone: [phone]   Fax:  [phone]  Patient Details  Name: Roger Rodriguez MRN: [id number] Date of Birth: September 17, 1940 Referring Provider:  Vladimir Crofts, MD  Encounter Date: 04/11/2015  Achilles Dunk, OTR/L, CLT Lovett,Amy 04/12/2015, 3:12 PM  [place] MAIN [facility] SERVICES [address] [place], [place], [address] Phone: [phone]   Fax:  [phone]

## 2015-04-15 ENCOUNTER — Ambulatory Visit: Payer: PPO | Admitting: Occupational Therapy

## 2015-04-15 ENCOUNTER — Ambulatory Visit: Payer: PPO | Admitting: Speech Pathology

## 2015-04-16 ENCOUNTER — Ambulatory Visit: Payer: PPO | Admitting: Speech Pathology

## 2015-04-16 ENCOUNTER — Ambulatory Visit: Payer: PPO | Admitting: Occupational Therapy

## 2015-04-17 ENCOUNTER — Ambulatory Visit: Payer: PPO | Admitting: Speech Pathology

## 2015-04-17 ENCOUNTER — Ambulatory Visit: Payer: PPO | Admitting: Occupational Therapy

## 2015-04-18 ENCOUNTER — Ambulatory Visit: Payer: PPO | Admitting: Occupational Therapy

## 2015-04-18 ENCOUNTER — Ambulatory Visit: Payer: PPO | Admitting: Speech Pathology

## 2015-04-22 ENCOUNTER — Ambulatory Visit: Payer: PPO | Admitting: Speech Pathology

## 2015-04-22 ENCOUNTER — Ambulatory Visit: Payer: PPO | Admitting: Occupational Therapy

## 2015-04-23 ENCOUNTER — Ambulatory Visit: Payer: PPO | Admitting: Occupational Therapy

## 2015-04-23 ENCOUNTER — Ambulatory Visit: Payer: PPO | Admitting: Speech Pathology

## 2015-04-24 ENCOUNTER — Ambulatory Visit: Payer: PPO | Admitting: Speech Pathology

## 2015-04-24 ENCOUNTER — Ambulatory Visit: Payer: PPO | Admitting: Occupational Therapy

## 2015-04-25 ENCOUNTER — Ambulatory Visit: Payer: PPO | Admitting: Occupational Therapy

## 2015-04-25 ENCOUNTER — Ambulatory Visit: Payer: PPO | Admitting: Speech Pathology

## 2015-04-29 ENCOUNTER — Ambulatory Visit: Payer: PPO | Admitting: Occupational Therapy

## 2015-04-29 ENCOUNTER — Encounter: Payer: Self-pay | Admitting: Speech Pathology

## 2015-04-29 ENCOUNTER — Encounter: Payer: Self-pay | Admitting: Occupational Therapy

## 2015-04-29 ENCOUNTER — Ambulatory Visit: Payer: PPO | Attending: Neurology | Admitting: Speech Pathology

## 2015-04-29 DIAGNOSIS — M6281 Muscle weakness (generalized): Secondary | ICD-10-CM | POA: Diagnosis present

## 2015-04-29 DIAGNOSIS — R490 Dysphonia: Secondary | ICD-10-CM | POA: Diagnosis present

## 2015-04-29 DIAGNOSIS — R2681 Unsteadiness on feet: Secondary | ICD-10-CM | POA: Diagnosis present

## 2015-04-29 DIAGNOSIS — R460 Very low level of personal hygiene: Secondary | ICD-10-CM

## 2015-04-29 DIAGNOSIS — Z741 Need for assistance with personal care: Secondary | ICD-10-CM

## 2015-04-29 DIAGNOSIS — R279 Unspecified lack of coordination: Secondary | ICD-10-CM | POA: Diagnosis present

## 2015-04-29 NOTE — Therapy (Signed)
[place] MAIN [facility] SERVICES [address] [place], [place], [address] Phone: [phone]   Fax:  [phone]  Occupational Therapy Treatment  Patient Details  Name: Roger Rodriguez MRN: [id number] Date of Birth: [birth year] No Data Recorded  Encounter Date: 04/29/2015      OT End of Session - 04/29/15 1550    Visit Number 5   Number of Visits 17   Date for OT Re-Evaluation 05/07/15   Authorization Type Medicare G code 5   OT Start Time 2536   OT Stop Time 1205   OT Time Calculation (min) 62 min   Activity Tolerance Patient tolerated treatment well   Behavior During Therapy [facility] for tasks assessed/performed      Past Medical History  Diagnosis Date  . Hyperlipidemia   . Hypertension   . Inguinal hernia   . Arthritis     OA  . Allergy     allergic rhinitis  . Cancer [facility])     Prostate  . Parkinson's disease [facility])     Past Surgical History  Procedure Laterality Date  . Rotator cuff tear  1993    Repaired  . H/o arthroscopy of right knee    . H/o radical protatectomy    . Vasectomy      History    There were no vitals filed for this visit.  Visit Diagnosis:  Muscle weakness (generalized)  Lack of coordination  Unsteady gait  Self-care deficit for dressing and grooming      Subjective Assessment - 04/29/15 1545    Subjective  Patient reports his wife passed away, he has been really sad and lonely the past week or so, the house is quiet.  Patient reports he wants to continue with therapy since it is helpful to get out of the house and focus on getting himself better.     Patient Stated Goals Patient reports he wants to walk better, have good balance and to be independent.    Currently in Pain? No/denies   Multiple Pain Sites No                      OT Treatments/Exercises (OP) - 04/29/15 1547    ADLs   ADL Comments Functional component tasks of sit to stand without the use of arms, hand to mouth  patterns with spoon, reaching the back of the head on the left.  Patient used weighted utensil at home but feels he cannot tell a big difference and the untensil is somewhat heavy.     Neurological Re-education Exercises   Other Exercises 1 Patient seen for initial instruction of LSVT BIG exercises: LSVT Daily Session Maximal Daily Exercises: Sustained movements are designed to rescale the amplitude of movement output for generalization to daily functional activities. Performed as follows for 1 set of 10 repetitions each: Multi directional sustained movements- 1) Floor to ceiling, 2) Side to side. Multi directional Repetitive movements performed in standing and are designed to provide retraining effort needed for sustained muscle activation in tasks Performed as follows: 3) Step and reach forward, 4) Step and Reach Backwards, 5) Step and reach sideways, 6) Rock and reach forward/backward, 7) Rock and reach sideways. Sit to stand from mat table on lowest setting with cues for weight shift, technique and CGA for 5 reps for 2 sets. Patient seen for functional mobility tasks this date with emphasis on gait speed, length of steps with short distance ambulation. Patient requires cues for  BIG movements and cadenceAll standing exercises performed this date with CGA, verbal and tactile cues as needed   Other Exercises 2 Functional mobility utilizing LSVT BIG techniques for 1 trial of 400 feet, cues for increasing amplitude of steps as well as reciprocal arm swing.  Performed outdoors with CGA for sloped areas and uneven surfaces.                OT Education - 04/29/15 1549    Education provided Yes   Education Details LSVT BIG maximal daily exercises, HEP, amplitude of gait.   Person(s) Educated Patient   Methods Explanation;Demonstration;Verbal cues   Comprehension Verbal cues required;Returned demonstration;Verbalized understanding             OT Long Term Goals - 04/02/15 1548    OT LONG  TERM GOAL #1   Title Patient will improve gait speed and endurance and be able to ambulate 950 feet in 6 minutes to negotiate around the home and community safely.   Baseline 780 feet at evaluation.   Time 4   Period Weeks   Status New   OT LONG TERM GOAL #2   Title Patient will complete HEP for maximal daily exercises with modified independence    Baseline no current program   Time 4   Period Weeks   Status New   OT LONG TERM GOAL #3   Title Patient will transfer from sit to stand without the use of arms safely and independently from a variety of chairs/surfaces.   Baseline difficulty from low surfaces.   Time 4   Period Weeks   Status New   OT LONG TERM GOAL #4   Title Patient will demonstrate improved balance for functional daily tasks with a score on Berg Balance test greater than 50/56.   Baseline 45/56 at initial evaluation.   Time 4   Period Weeks   Status New   OT LONG TERM GOAL #5   Title Patient will demonstrate a decrease in hesistation and freezing behaviors with initiation and termination of gait and with turning behaviors as evidenced of a score lower than 10 on the Freezing of Gait Questionairre.   Baseline score of 14 on FOG at eval.    Time 4   Period Weeks   Status New   Long Term Additional Goals   Additional Long Term Goals Yes   OT LONG TERM GOAL #6   Title Patient will self feed with minimal spillage using utensils and complete hand to mouth patterns with 90% accuracy or greater.    Baseline spills each meal and unable to control utensil to mouth.   Time 4   Period Weeks   Status New   OT LONG TERM GOAL #7   Title Patient will demonstrate improved external rotation of BUE to comb the back of his hair independently.   Baseline difficulty reaching back of head at eval.   Time 4   Period Weeks   Status New   OT LONG TERM GOAL #8   Title Patient will negotiate 5 steps safely demonstrating a reciprocal LE patterns with good form.   Baseline decreased  balance and safety with stair negotiation at eval.   Time 4   Period Weeks   Status New               Plan - 04/29/15 1550    Clinical Impression Statement Patient had a disruption in his LSVT protocol with the death of his wife recently.  He  returned this date and is wanting to continue with therapy at this time.  He was able to perform some of his exercises at home over the last 1-2 weeks.  He was able to complete exercises this date with cues and CGA this date.  Mild difficulty with sit to stand without the use of arm rests this date.  Will continue with maximal daily exercises, functional mobility/transfers and balance tasks to improve independence in daily tasks at home.     Pt will benefit from skilled therapeutic intervention in order to improve on the following deficits (Retired) Decreased coordination;Decreased range of motion;Difficulty walking;Decreased endurance;Decreased balance;Impaired UE functional use;Decreased mobility;Decreased strength   Rehab Potential Good   OT Frequency 4x / week   OT Duration 4 weeks   OT Treatment/Interventions Self-care/ADL training;Therapeutic exercise;Functional Mobility Training;Patient/family education;Neuromuscular education;Balance training;DME and/or AE instruction;Gait Training;Stair Training   Consulted and Agree with Plan of Care Patient        Problem List Patient Active Problem List   Diagnosis Date Noted  . Hepatitis A 04/02/2015  . Allergic rhinitis 04/02/2015  . Arthritis 04/02/2015  . Narrowing of intervertebral disc space 04/02/2015  . 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 Accumulation of fluid in tissues 04/02/2015  . Essential (primary) hypertension 04/02/2015  . Borderline diabetes 04/02/2015  . HLD (hyperlipidemia) 04/02/2015  . BP (high blood pressure) 04/02/2015  . Infected sebaceous cyst 04/02/2015  . Hernia, inguinal 04/02/2015  . Leg pain 04/02/2015  . Lumbar canal stenosis 04/02/2015  . Lumbar and  sacral osteoarthritis 04/02/2015  . Malignant melanoma ([place]) 04/02/2015  . Arthritis, degenerative 04/02/2015  . Fatigue 04/02/2015  . Idiopathic Parkinson's disease ([place]) 04/02/2015  . Plantar fasciitis 04/02/2015  . CA of prostate ([place]) 04/02/2015  . Benign essential tremor 04/02/2015  . Lower urinary tract infection 04/02/2015   Nikelle Malatesta T Ranisha Allaire, OTR/L, CLT Damarian Priola 04/29/2015, 4:15 PM  [place] MAIN [facility] SERVICES [address] [place], [place], [address] Phone: [phone]   Fax:  [phone]  Name: Lemond Griffee MRN: [id number] Date of Birth: 1940/12/14

## 2015-04-29 NOTE — Therapy (Signed)
[place] MAIN [facility] SERVICES [address] [place], [place], [address] Phone: [phone]   Fax:  [phone]  Speech Language Pathology Treatment  Patient Details  Name: Roger Rodriguez MRN: [id number] Date of Birth: Nov 17, 1940 No Data Recorded  Encounter Date: 04/29/2015      End of Session - 04/29/15 1112    Visit Number 5   Number of Visits 17   Date for SLP Re-Evaluation 05/10/15   SLP Start Time 69   SLP Stop Time  1058   SLP Time Calculation (min) 53 min   Activity Tolerance Patient tolerated treatment well      Past Medical History  Diagnosis Date  . Hyperlipidemia   . Hypertension   . Inguinal hernia   . Arthritis     OA  . Allergy     allergic rhinitis  . Cancer [facility])     Prostate  . Parkinson's disease [facility])     Past Surgical History  Procedure Laterality Date  . Rotator cuff tear  1993    Repaired  . H/o arthroscopy of right knee    . H/o radical protatectomy    . Vasectomy      History    There were no vitals filed for this visit.  Visit Diagnosis: Dysphonia      Subjective Assessment - 04/29/15 1112    Subjective The patient has missed a week of sessions due to illness and death of wife.  He reports that he wants to continue his treatment.   Currently in Pain? No/denies               ADULT SLP TREATMENT - 04/29/15 0001    General Information   Behavior/Cognition Alert;Cooperative;Pleasant mood   Treatment Provided   Treatment provided Cognitive-Linquistic   Pain Assessment   Pain Assessment No/denies pain   Cognitive-Linquistic Treatment   Treatment focused on Voice   Skilled Treatment Daily Task #1: Average 15 seconds, 85 dB. Daily Task 2: Highs: 15 high pitched "ah" given mod cues. Lows: 15 low pitched "ah" given mod cues. Daily task #3: Average 70 dB.  Hierarchal speech loudness drill: Read words/sentences, 70 dB. Generate phrase to complete cognitive-linguistic task, 73 dB.   Homework: assignments completed.  Off the cuff remarks: average 65 dB.   Assessment / Recommendations / Plan   Plan Continue with current plan of care   Progression Toward Goals   Progression toward goals Progressing toward goals          SLP Education - 04/29/15 1112    Education provided Yes   Education Details LSVT-LOUD   Person(s) Educated Patient   Methods Explanation;Demonstration;Verbal cues;Handout   Comprehension Verbalized understanding;Returned demonstration;Verbal cues required;Need further instruction            SLP Long Term Goals - 04/03/15 1055    SLP LONG TERM GOAL #1   Title The patient will complete Daily Tasks (Maximum duration "ah", High/Lows, and Functional Phrases) at average loudness of 80 dB and with loud, good quality voice.    Time 4   Period Weeks   Status New   SLP LONG TERM GOAL #2   Title The patient will complete Hierarchal Speech Loudness reading drills (words/phrases, sentences, and paragraph) at average 75 dB and with loud, good quality voice.     Time 4   Period Weeks   Status New   SLP LONG TERM GOAL #3   Title The patient will complete homework daily.  Time 4   Period Weeks   Status New   SLP LONG TERM GOAL #4   Title The patient will participate in conversation, maintaining average loudness of 75 dB and loud, good quality voice.   Time 4   Period Weeks   Status New          Plan - 04/29/15 1113    Clinical Impression Statement The patient has missed a week of sessions due to the illness and death of his wife.  After this absence, the patient is completing daily tasks and hierarchal speech drill tasks with loud, good quality voice given mod SLP cues.     Speech Therapy Frequency 4x / week   Duration 4 weeks   Treatment/Interventions Other (comment)  LSVT-LOUD   Potential to Achieve Goals Good   Potential Considerations Ability to learn/carryover information;Cooperation/participation level;Previous level of  function;Severity of impairments;Family/community support;Other (comment)   SLP Home Exercise Plan LSVT-LOUD daily homework   Consulted and Agree with Plan of Care Patient        Problem List Patient Active Problem List   Diagnosis Date Noted  . Hepatitis A 04/02/2015  . Allergic rhinitis 04/02/2015  . Arthritis 04/02/2015  . Narrowing of intervertebral disc space 04/02/2015  . 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 Accumulation of fluid in tissues 04/02/2015  . Essential (primary) hypertension 04/02/2015  . Borderline diabetes 04/02/2015  . HLD (hyperlipidemia) 04/02/2015  . BP (high blood pressure) 04/02/2015  . Infected sebaceous cyst 04/02/2015  . Hernia, inguinal 04/02/2015  . Leg pain 04/02/2015  . Lumbar canal stenosis 04/02/2015  . 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 Malignant melanoma ([place]) 04/02/2015  . Arthritis, degenerative 04/02/2015  . Fatigue 04/02/2015  . Idiopathic Parkinson's disease ([place]) 04/02/2015  . Plantar fasciitis 04/02/2015  . CA of prostate ([place]) 04/02/2015  . Benign essential tremor 04/02/2015  . Lower urinary tract infection 04/02/2015    Leroy Sea, MS/CCC- SLP  Lou Miner 04/29/2015, 11:16 AM  [place] MAIN [facility] SERVICES [address] [place], [place], [address] Phone: [phone]   Fax:  [phone]   Name: Roger Rodriguez MRN: [id number] Date of Birth: [birth year]

## 2015-04-30 ENCOUNTER — Ambulatory Visit: Payer: PPO | Admitting: Occupational Therapy

## 2015-04-30 ENCOUNTER — Ambulatory Visit: Payer: PPO | Admitting: Speech Pathology

## 2015-04-30 DIAGNOSIS — Z741 Need for assistance with personal care: Secondary | ICD-10-CM

## 2015-04-30 DIAGNOSIS — R490 Dysphonia: Secondary | ICD-10-CM

## 2015-04-30 DIAGNOSIS — R279 Unspecified lack of coordination: Secondary | ICD-10-CM

## 2015-04-30 DIAGNOSIS — R460 Very low level of personal hygiene: Secondary | ICD-10-CM

## 2015-04-30 DIAGNOSIS — M6281 Muscle weakness (generalized): Secondary | ICD-10-CM

## 2015-04-30 NOTE — Therapy (Signed)
[place] MAIN [facility] SERVICES [address] [place], [place], [address] Phone: [phone]   Fax:  [phone]  Occupational Therapy Treatment  Patient Details  Name: Roger Rodriguez MRN: [id number] Date of Birth: 15-Dec-1940 No Data Recorded  Encounter Date: 04/30/2015      OT End of Session - 04/30/15 1600    Visit Number 6   Number of Visits 17   Date for OT Re-Evaluation 05/07/15   Authorization Type Medicare G code 6   OT Start Time 1100   OT Stop Time 1159   OT Time Calculation (min) 59 min   Activity Tolerance Patient tolerated treatment well   Behavior During Therapy [facility] for tasks assessed/performed      Past Medical History  Diagnosis Date  . Hyperlipidemia   . Hypertension   . Inguinal hernia   . Arthritis     OA  . Allergy     allergic rhinitis  . Cancer [facility])     Prostate  . Parkinson's disease [facility])     Past Surgical History  Procedure Laterality Date  . Rotator cuff tear  1993    Repaired  . H/o arthroscopy of right knee    . H/o radical protatectomy    . Vasectomy      History    There were no vitals filed for this visit.  Visit Diagnosis:  Muscle weakness (generalized)  Lack of coordination  Self-care deficit for dressing and grooming      Subjective Assessment - 04/30/15 1559    Subjective  Patient reports he went out last night to see his granddaughter inducted into the honor society at school.  Is dreading the weekend since his sons are going to be out of town and he will be alone.   Currently in Pain? Yes   Pain Score 3    Pain Location Knee   Pain Orientation Right   Pain Descriptors / Indicators Aching   Pain Type Acute pain   Pain Onset Today   Multiple Pain Sites No                      OT Treatments/Exercises (OP) - 04/30/15 1602    ADLs   ADL Comments Functional component tasks of sit to stand without the use of arms, hand to mouth patterns with spoon, reaching  the back of the head on the left. Patient used weighted utensil at home but feels he cannot tell a big difference and the untensil is somewhat heavy.  Patient also discussing the difficulty with donning and doffing sweatshirt as well as being able to wear compression hose to help with the edema in his legs.    Neurological Re-education Exercises   Other Exercises 1 Patient seen for initial instruction of LSVT BIG exercises: LSVT Daily Session Maximal Daily Exercises: Sustained movements are designed to rescale the amplitude of movement output for generalization to daily functional activities. Performed as follows for 1 set of 10 repetitions each: Multi directional sustained movements- 1) Floor to ceiling, 2) Side to side. Multi directional Repetitive movements performed in standing and are designed to provide retraining effort needed for sustained muscle activation in tasks Performed as follows: 3) Step and reach forward, 4) Step and Reach Backwards, 5) Step and reach sideways, 6) Rock and reach forward/backward, 7) Rock and reach sideways. Sit to stand from mat table on lowest setting with cues for weight shift, technique and CGA for 5 reps for  2 sets. Patient seen for functional mobility tasks this date with emphasis on gait speed, length of steps with short distance ambulation. Patient requires cues for BIG movements and cadenceAll standing exercises performed this date with CGA, verbal and tactile cues as needed.    Other Exercises 2 Functional mobility utilizing LSVT BIG techniques for 2 trials of 350 feet, cues for increasing amplitude of steps as well as reciprocal arm swing. Performed outdoors with CGA for sloped areas and uneven surfaces                OT Education - 04/30/15 1600    Education provided Yes   Education Details Maximal daily exercises, HEP   Person(s) Educated Patient   Methods Explanation;Demonstration;Verbal cues   Comprehension Verbal cues required;Returned  demonstration;Verbalized understanding             OT Long Term Goals - 04/02/15 1548    OT LONG TERM GOAL #1   Title Patient will improve gait speed and endurance and be able to ambulate 950 feet in 6 minutes to negotiate around the home and community safely.   Baseline 780 feet at evaluation.   Time 4   Period Weeks   Status New   OT LONG TERM GOAL #2   Title Patient will complete HEP for maximal daily exercises with modified independence    Baseline no current program   Time 4   Period Weeks   Status New   OT LONG TERM GOAL #3   Title Patient will transfer from sit to stand without the use of arms safely and independently from a variety of chairs/surfaces.   Baseline difficulty from low surfaces.   Time 4   Period Weeks   Status New   OT LONG TERM GOAL #4   Title Patient will demonstrate improved balance for functional daily tasks with a score on Berg Balance test greater than 50/56.   Baseline 45/56 at initial evaluation.   Time 4   Period Weeks   Status New   OT LONG TERM GOAL #5   Title Patient will demonstrate a decrease in hesistation and freezing behaviors with initiation and termination of gait and with turning behaviors as evidenced of a score lower than 10 on the Freezing of Gait Questionairre.   Baseline score of 14 on FOG at eval.    Time 4   Period Weeks   Status New   Long Term Additional Goals   Additional Long Term Goals Yes   OT LONG TERM GOAL #6   Title Patient will self feed with minimal spillage using utensils and complete hand to mouth patterns with 90% accuracy or greater.    Baseline spills each meal and unable to control utensil to mouth.   Time 4   Period Weeks   Status New   OT LONG TERM GOAL #7   Title Patient will demonstrate improved external rotation of BUE to comb the back of his hair independently.   Baseline difficulty reaching back of head at eval.   Time 4   Period Weeks   Status New   OT LONG TERM GOAL #8   Title Patient  will negotiate 5 steps safely demonstrating a reciprocal LE patterns with good form.   Baseline decreased balance and safety with stair negotiation at eval.   Time 4   Period Weeks   Status New               Plan - 04/30/15 1601  Clinical Impression Statement Patient demonstrates understanding of exercises in the clinic with cues and CGA, issued additional handout of daily exercises this date to use as a guide at home.  He was able to increase distance this date with use of greater amplitude of steps with cues from therapist.  When patient fatigues, he tends to revert back to shorter shuffling steps but does well with short rest breaks and cues for gait patterns.    Pt will benefit from skilled therapeutic intervention in order to improve on the following deficits (Retired) Decreased coordination;Decreased range of motion;Difficulty walking;Decreased endurance;Decreased balance;Impaired UE functional use;Decreased mobility;Decreased strength   Rehab Potential Good   OT Frequency 4x / week   OT Duration 4 weeks   OT Treatment/Interventions Self-care/ADL training;Therapeutic exercise;Functional Mobility Training;Patient/family education;Neuromuscular education;Balance training;DME and/or AE instruction;Gait Training;Stair Training   OT Home Exercise Plan LSVT BIG exercises, issued written/pictorial handout.   Consulted and Agree with Plan of Care Patient        Problem List Patient Active Problem List   Diagnosis Date Noted  . Hepatitis A 04/02/2015  . Allergic rhinitis 04/02/2015  . Arthritis 04/02/2015  . Narrowing of intervertebral disc space 04/02/2015  . 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 Accumulation of fluid in tissues 04/02/2015  . Essential (primary) hypertension 04/02/2015  . Borderline diabetes 04/02/2015  . HLD (hyperlipidemia) 04/02/2015  . BP (high blood pressure) 04/02/2015  . Infected sebaceous cyst 04/02/2015  . Hernia, inguinal 04/02/2015  . Leg  pain 04/02/2015  . Lumbar canal stenosis 04/02/2015  . 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 Malignant melanoma ([place]) 04/02/2015  . Arthritis, degenerative 04/02/2015  . Fatigue 04/02/2015  . Idiopathic Parkinson's disease ([place]) 04/02/2015  . Plantar fasciitis 04/02/2015  . CA of prostate ([place]) 04/02/2015  . Benign essential tremor 04/02/2015  . Lower urinary tract infection 04/02/2015   Velton Roselle T Darrelyn Morro, OTR/L, CLT [place] 04/30/2015, 4:10 PM  [place] MAIN [facility] SERVICES [address] [place], [place], [address] Phone: [phone]   Fax:  [phone]  Name: Roger Rodriguez MRN: [id number] Date of Birth: 1940/08/14

## 2015-05-01 ENCOUNTER — Ambulatory Visit: Payer: PPO | Admitting: Occupational Therapy

## 2015-05-01 ENCOUNTER — Encounter: Payer: Self-pay | Admitting: Speech Pathology

## 2015-05-01 ENCOUNTER — Ambulatory Visit: Payer: PPO | Admitting: Speech Pathology

## 2015-05-01 DIAGNOSIS — R490 Dysphonia: Secondary | ICD-10-CM

## 2015-05-01 DIAGNOSIS — R2681 Unsteadiness on feet: Secondary | ICD-10-CM

## 2015-05-01 DIAGNOSIS — Z741 Need for assistance with personal care: Secondary | ICD-10-CM

## 2015-05-01 DIAGNOSIS — R279 Unspecified lack of coordination: Secondary | ICD-10-CM

## 2015-05-01 DIAGNOSIS — M6281 Muscle weakness (generalized): Secondary | ICD-10-CM

## 2015-05-01 DIAGNOSIS — R460 Very low level of personal hygiene: Secondary | ICD-10-CM

## 2015-05-01 NOTE — Therapy (Signed)
[place] MAIN [facility] SERVICES [address] [place], [place], [address] Phone: [phone]   Fax:  [phone]  Speech Language Pathology Treatment  Patient Details  Name: Roger Rodriguez MRN: [id number] Date of Birth: 25-Oct-1940 No Data Recorded  Encounter Date: 05/01/2015      End of Session - 05/01/15 1106    Visit Number 7   Number of Visits 17   Date for SLP Re-Evaluation 05/10/15   SLP Start Time 0955   SLP Stop Time  8250   SLP Time Calculation (min) 52 min      Past Medical History  Diagnosis Date  . Hyperlipidemia   . Hypertension   . Inguinal hernia   . Arthritis     OA  . Allergy     allergic rhinitis  . Cancer [facility])     Prostate  . Parkinson's disease [facility])     Past Surgical History  Procedure Laterality Date  . Rotator cuff tear  1993    Repaired  . H/o arthroscopy of right knee    . H/o radical protatectomy    . Vasectomy      History    There were no vitals filed for this visit.  Visit Diagnosis: Dysphonia      Subjective Assessment - 05/01/15 1106    Subjective The patient inconsistently implements loudness to improve vocal quality.   Currently in Pain? No/denies               ADULT SLP TREATMENT - 05/01/15 0001    General Information   Behavior/Cognition Alert;Cooperative;Pleasant mood   Treatment Provided   Treatment provided Cognitive-Linquistic   Pain Assessment   Pain Assessment No/denies pain   Cognitive-Linquistic Treatment   Treatment focused on Voice   Skilled Treatment Daily Task #1: Average 19 seconds, 85 dB. Daily Task 2: Highs: 15 high pitched "ah" given mod cues. Lows: 15 low pitched "ah" given mod cues. Daily task #3: Average 73 dB.  Hierarchal speech loudness drill: Read words/sentences, 72 dB. Generate phrase to complete cognitive-linguistic task, 73 dB.  Homework: assignments completed.  Off the cuff remarks: average 65 dB.   Assessment / Recommendations / Plan   Plan Continue with current plan of care   Progression Toward Goals   Progression toward goals Progressing toward goals          SLP Education - 05/01/15 1106    Education provided Yes   Education Details LSVT-LOUD   Person(s) Educated Patient   Methods Explanation;Demonstration;Verbal cues;Handout   Comprehension Verbalized understanding;Returned demonstration;Verbal cues required;Need further instruction            SLP Long Term Goals - 04/03/15 1055    SLP LONG TERM GOAL #1   Title The patient will complete Daily Tasks (Maximum duration "ah", High/Lows, and Functional Phrases) at average loudness of 80 dB and with loud, good quality voice.    Time 4   Period Weeks   Status New   SLP LONG TERM GOAL #2   Title The patient will complete Hierarchal Speech Loudness reading drills (words/phrases, sentences, and paragraph) at average 75 dB and with loud, good quality voice.     Time 4   Period Weeks   Status New   SLP LONG TERM GOAL #3   Title The patient will complete homework daily.   Time 4   Period Weeks   Status New   SLP LONG TERM GOAL #4   Title The patient will  participate in conversation, maintaining average loudness of 75 dB and loud, good quality voice.   Time 4   Period Weeks   Status New          Plan - 05/01/15 1107    Clinical Impression Statement The patient is completing daily tasks and hierarchal speech drill tasks with loud, good quality voice given mod SLP cues.  He is demonstrating emerging generalization to conversational speech.   Speech Therapy Frequency 4x / week   Duration 4 weeks   Treatment/Interventions Other (comment)  LSVT-LOUD   Potential to Achieve Goals Good   Potential Considerations Ability to learn/carryover information;Cooperation/participation level;Previous level of function;Severity of impairments;Family/community support;Other (comment)   SLP Home Exercise Plan LSVT-LOUD daily homework   Consulted and Agree with Plan of Care  Patient        Problem List Patient Active Problem List   Diagnosis Date Noted  . Hepatitis A 04/02/2015  . Allergic rhinitis 04/02/2015  . Arthritis 04/02/2015  . Narrowing of intervertebral disc space 04/02/2015  . 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 Accumulation of fluid in tissues 04/02/2015  . Essential (primary) hypertension 04/02/2015  . Borderline diabetes 04/02/2015  . HLD (hyperlipidemia) 04/02/2015  . BP (high blood pressure) 04/02/2015  . Infected sebaceous cyst 04/02/2015  . Hernia, inguinal 04/02/2015  . Leg pain 04/02/2015  . Lumbar canal stenosis 04/02/2015  . 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 Malignant melanoma ([place]) 04/02/2015  . Arthritis, degenerative 04/02/2015  . Fatigue 04/02/2015  . Idiopathic Parkinson's disease ([place]) 04/02/2015  . Plantar fasciitis 04/02/2015  . CA of prostate ([place]) 04/02/2015  . Benign essential tremor 04/02/2015  . Lower urinary tract infection 04/02/2015   Leroy Sea, MS/CCC- SLP  Lou Miner 05/01/2015, 11:08 AM  [place] MAIN [facility] SERVICES [address] [place], [place], [address] Phone: [phone]   Fax:  [phone]   Name: Luisdavid Hamblin MRN: [id number] Date of Birth: 1941/05/11

## 2015-05-01 NOTE — Therapy (Signed)
[place] MAIN [facility] SERVICES [address] [place], [place], [address] Phone: [phone]   Fax:  [phone]  Speech Language Pathology Treatment  Patient Details  Name: Roger Rodriguez MRN: [id number] Date of Birth: [birth year] No Data Recorded  Encounter Date: 04/30/2015      End of Session - 05/01/15 0820    Visit Number 6   Number of Visits 17   Date for SLP Re-Evaluation 05/10/15   SLP Start Time 1010   SLP Stop Time  1055   SLP Time Calculation (min) 45 min   Activity Tolerance Patient tolerated treatment well      Past Medical History  Diagnosis Date  . Hyperlipidemia   . Hypertension   . Inguinal hernia   . Arthritis     OA  . Allergy     allergic rhinitis  . Cancer [facility])     Prostate  . Parkinson's disease [facility])     Past Surgical History  Procedure Laterality Date  . Rotator cuff tear  1993    Repaired  . H/o arthroscopy of right knee    . H/o radical protatectomy    . Vasectomy      History    There were no vitals filed for this visit.  Visit Diagnosis: Dysphonia      Subjective Assessment - 05/01/15 0819    Subjective The patient will inconsistently implement loudness to improve vocal quality.                 SLP Education - 05/01/15 0820    Education provided Yes   Education Details LSVT-LOUD   Person(s) Educated Patient   Methods Explanation;Demonstration;Verbal cues;Handout   Comprehension Verbalized understanding;Returned demonstration;Verbal cues required;Need further instruction            SLP Long Term Goals - 04/03/15 1055    SLP LONG TERM GOAL #1   Title The patient will complete Daily Tasks (Maximum duration "ah", High/Lows, and Functional Phrases) at average loudness of 80 dB and with loud, good quality voice.    Time 4   Period Weeks   Status New   SLP LONG TERM GOAL #2   Title The patient will complete Hierarchal Speech Loudness reading drills (words/phrases,  sentences, and paragraph) at average 75 dB and with loud, good quality voice.     Time 4   Period Weeks   Status New   SLP LONG TERM GOAL #3   Title The patient will complete homework daily.   Time 4   Period Weeks   Status New   SLP LONG TERM GOAL #4   Title The patient will participate in conversation, maintaining average loudness of 75 dB and loud, good quality voice.   Time 4   Period Weeks   Status New          Plan - 05/01/15 5093    Clinical Impression Statement The patient is completing daily tasks and hierarchal speech drill tasks with loud, good quality voice given mod SLP cues.     Speech Therapy Frequency 4x / week   Duration 4 weeks   Treatment/Interventions Other (comment)  LSVT-LOUD   Potential to Achieve Goals Good   Potential Considerations Ability to learn/carryover information;Cooperation/participation level;Previous level of function;Severity of impairments;Family/community support;Other (comment)   SLP Home Exercise Plan LSVT-LOUD daily homework   Consulted and Agree with Plan of Care Patient        Problem List Patient Active Problem List  Diagnosis Date Noted  . Hepatitis A 04/02/2015  . Allergic rhinitis 04/02/2015  . Arthritis 04/02/2015  . Narrowing of intervertebral disc space 04/02/2015  . 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 Accumulation of fluid in tissues 04/02/2015  . Essential (primary) hypertension 04/02/2015  . Borderline diabetes 04/02/2015  . HLD (hyperlipidemia) 04/02/2015  . BP (high blood pressure) 04/02/2015  . Infected sebaceous cyst 04/02/2015  . Hernia, inguinal 04/02/2015  . Leg pain 04/02/2015  . Lumbar canal stenosis 04/02/2015  . 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 Malignant melanoma ([place]) 04/02/2015  . Arthritis, degenerative 04/02/2015  . Fatigue 04/02/2015  . Idiopathic Parkinson's disease ([place]) 04/02/2015  . Plantar fasciitis 04/02/2015  . CA of prostate ([place]) 04/02/2015  . Benign essential  tremor 04/02/2015  . Lower urinary tract infection 04/02/2015   Leroy Sea, MS/CCC- SLP  Lou Miner 05/01/2015, 8:22 AM  [place] MAIN [facility] SERVICES [address] [place], [place], [address] Phone: [phone]   Fax:  [phone]   Name: Roger Rodriguez MRN: [id number] Date of Birth: 21-Jul-1940

## 2015-05-02 ENCOUNTER — Ambulatory Visit: Payer: PPO | Admitting: Occupational Therapy

## 2015-05-02 ENCOUNTER — Ambulatory Visit: Payer: PPO | Admitting: Speech Pathology

## 2015-05-02 ENCOUNTER — Encounter: Payer: Self-pay | Admitting: Speech Pathology

## 2015-05-02 DIAGNOSIS — R490 Dysphonia: Secondary | ICD-10-CM | POA: Diagnosis not present

## 2015-05-02 DIAGNOSIS — M6281 Muscle weakness (generalized): Secondary | ICD-10-CM

## 2015-05-02 DIAGNOSIS — R2681 Unsteadiness on feet: Secondary | ICD-10-CM

## 2015-05-02 DIAGNOSIS — Z741 Need for assistance with personal care: Secondary | ICD-10-CM

## 2015-05-02 DIAGNOSIS — R460 Very low level of personal hygiene: Secondary | ICD-10-CM

## 2015-05-02 DIAGNOSIS — R279 Unspecified lack of coordination: Secondary | ICD-10-CM

## 2015-05-02 NOTE — Therapy (Signed)
[place] MAIN [facility] SERVICES [address] [place], [place], [address] Phone: [phone]   Fax:  [phone]  Speech Language Pathology Treatment  Patient Details  Name: Roger Rodriguez MRN: [id number] Date of Birth: 1941-05-22 No Data Recorded  Encounter Date: 05/02/2015      End of Session - 05/02/15 1058    Visit Number 8   Number of Visits 17   Date for SLP Re-Evaluation 05/10/15   SLP Start Time 1000   SLP Stop Time  1055   SLP Time Calculation (min) 55 min   Activity Tolerance Patient tolerated treatment well      Past Medical History  Diagnosis Date  . Hyperlipidemia   . Hypertension   . Inguinal hernia   . Arthritis     OA  . Allergy     allergic rhinitis  . Cancer [facility])     Prostate  . Parkinson's disease [facility])     Past Surgical History  Procedure Laterality Date  . Rotator cuff tear  1993    Repaired  . H/o arthroscopy of right knee    . H/o radical protatectomy    . Vasectomy      History    There were no vitals filed for this visit.  Visit Diagnosis: Dysphonia      Subjective Assessment - 05/02/15 1057    Subjective The patient is becoming more tolerant of how loud he is being asked to talk.   Currently in Pain? No/denies               ADULT SLP TREATMENT - 05/02/15 0001    General Information   Behavior/Cognition Alert;Cooperative;Pleasant mood   Treatment Provided   Treatment provided Cognitive-Linquistic   Pain Assessment   Pain Assessment No/denies pain   Cognitive-Linquistic Treatment   Treatment focused on Voice   Skilled Treatment Daily Task #1: Average 17 seconds, 85 dB. Daily Task 2: Highs: 15 high pitched "ah" given min cues. Lows: 15 low pitched "ah" given min cues. Daily task #3: Average 73 dB.  Hierarchal speech loudness drill: Read words/sentences, 73 dB. Generate phrase to complete cognitive-linguistic task, 73 dB.  Homework: assignments completed.  Off the cuff remarks:  average 65 dB (but up to 72 dB with min reminder to be loud).   Assessment / Recommendations / Plan   Plan Continue with current plan of care   Progression Toward Goals   Progression toward goals Progressing toward goals          SLP Education - 05/02/15 1057    Education provided Yes   Education Details LSVT-LOUD principles   Person(s) Educated Patient   Methods Explanation;Verbal cues;Handout   Comprehension Verbalized understanding;Returned demonstration;Verbal cues required;Need further instruction            SLP Long Term Goals - 04/03/15 1055    SLP LONG TERM GOAL #1   Title The patient will complete Daily Tasks (Maximum duration "ah", High/Lows, and Functional Phrases) at average loudness of 80 dB and with loud, good quality voice.    Time 4   Period Weeks   Status New   SLP LONG TERM GOAL #2   Title The patient will complete Hierarchal Speech Loudness reading drills (words/phrases, sentences, and paragraph) at average 75 dB and with loud, good quality voice.     Time 4   Period Weeks   Status New   SLP LONG TERM GOAL #3   Title The patient will complete  homework daily.   Time 4   Period Weeks   Status New   SLP LONG TERM GOAL #4   Title The patient will participate in conversation, maintaining average loudness of 75 dB and loud, good quality voice.   Time 4   Period Weeks   Status New          Plan - 05/02/15 1058    Clinical Impression Statement The patient is completing daily tasks and hierarchal speech drill tasks with loud, good quality voice given fewer SLP cues.  He is demonstrating generalization into conversation given reminders.    Speech Therapy Frequency 4x / week   Duration 4 weeks   Treatment/Interventions Other (comment)  LSVT-LOUD   Potential to Achieve Goals Good   Potential Considerations Ability to learn/carryover information;Cooperation/participation level;Previous level of function;Severity of impairments;Family/community  support;Other (comment)   SLP Home Exercise Plan LSVT-LOUD daily homework   Consulted and Agree with Plan of Care Patient        Problem List Patient Active Problem List   Diagnosis Date Noted  . Hepatitis A 04/02/2015  . Allergic rhinitis 04/02/2015  . Arthritis 04/02/2015  . Narrowing of intervertebral disc space 04/02/2015  . 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 Accumulation of fluid in tissues 04/02/2015  . Essential (primary) hypertension 04/02/2015  . Borderline diabetes 04/02/2015  . HLD (hyperlipidemia) 04/02/2015  . BP (high blood pressure) 04/02/2015  . Infected sebaceous cyst 04/02/2015  . Hernia, inguinal 04/02/2015  . Leg pain 04/02/2015  . Lumbar canal stenosis 04/02/2015  . 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 Malignant melanoma ([place]) 04/02/2015  . Arthritis, degenerative 04/02/2015  . Fatigue 04/02/2015  . Idiopathic Parkinson's disease ([place]) 04/02/2015  . Plantar fasciitis 04/02/2015  . CA of prostate ([place]) 04/02/2015  . Benign essential tremor 04/02/2015  . Lower urinary tract infection 04/02/2015   Leroy Sea, MS/CCC- SLP  Lou Miner 05/02/2015, 10:59 AM  [place] MAIN [name] SERVICES [address] [place], [place], [address] Phone: [phone]   Fax:  [phone]   Name: Roger Rodriguez MRN: [id number] Date of Birth: 1940-08-18

## 2015-05-02 NOTE — Therapy (Signed)
[place] MAIN [facility] SERVICES [address] [place], [place], [address] Phone: [phone]   Fax:  [phone]  Occupational Therapy Treatment  Patient Details  Name: Roger Rodriguez MRN: [id number] Date of Birth: [birth year] No Data Recorded  Encounter Date: 05/01/2015      OT End of Session - 05/01/15 1121    Visit Number 7   Number of Visits 17   Date for OT Re-Evaluation 05/07/15   Authorization Type Medicare G code 7   OT Start Time 1055   OT Stop Time 1156   OT Time Calculation (min) 61 min   Activity Tolerance Patient tolerated treatment well   Behavior During Therapy [facility] for tasks assessed/performed      Past Medical History  Diagnosis Date  . Hyperlipidemia   . Hypertension   . Inguinal hernia   . Arthritis     OA  . Allergy     allergic rhinitis  . Cancer [facility])     Prostate  . Parkinson's disease [facility])     Past Surgical History  Procedure Laterality Date  . Rotator cuff tear  1993    Repaired  . H/o arthroscopy of right knee    . H/o radical protatectomy    . Vasectomy      History    There were no vitals filed for this visit.  Visit Diagnosis:  Muscle weakness (generalized)  Lack of coordination  Self-care deficit for dressing and grooming  Unsteady gait      Subjective Assessment - 05/01/15 1119    Subjective  Patient reports he had a lot of company yesterday and did not get all his exercises done last night.  Planning to run some errands this afternoon but will do another set of exercises later this evening.    Patient Stated Goals Patient reports he wants to walk better, have good balance and to be independent.    Currently in Pain? No/denies   Pain Score 0-No pain   Multiple Pain Sites No                      OT Treatments/Exercises (OP) - 05/01/15 1200    ADLs   ADL Comments Functional component tasks of sit to stand without the use of arms, hand to mouth patterns with  spoon, reaching the back of the head on the left.    Neurological Re-education Exercises   Other Exercises 1 Patient seen for initial instruction of LSVT BIG exercises: LSVT Daily Session Maximal Daily Exercises: Sustained movements are designed to rescale the amplitude of movement output for generalization to daily functional activities. Performed as follows for 1 set of 10 repetitions each: Multi directional sustained movements- 1) Floor to ceiling, 2) Side to side. Multi directional Repetitive movements performed in standing and are designed to provide retraining effort needed for sustained muscle activation in tasks Performed as follows: 3) Step and reach forward, 4) Step and Reach Backwards, 5) Step and reach sideways, 6) Rock and reach forward/backward, 7) Rock and reach sideways. Sit to stand from mat table on lowest setting with cues for weight shift, technique and CGA for 5 reps for 2 sets. Patient seen for functional mobility tasks this date with emphasis on gait speed, length of steps with short distance ambulation. Patient requires cues for BIG movements and cadenceAll standing exercises performed this date with CGA, verbal and tactile cues as needed.    Other Exercises 2 Functional mobility utilizing LSVT  BIG techniques for 2 trials of 350 feet, cues for increasing amplitude of steps as well as reciprocal arm swing. Performed outdoors with CGA for sloped areas and uneven surfaces.  when patient fatigues he tends to shuffle more and requires cues for amplitude of steps.                OT Education - 05/01/15 1121    Education provided Yes   Education Details daily exercises, balance, amplitude of gait.    Person(s) Educated Patient   Methods Explanation;Demonstration;Verbal cues   Comprehension Verbal cues required;Returned demonstration;Verbalized understanding             OT Long Term Goals - 04/02/15 1548    OT LONG TERM GOAL #1   Title Patient will improve gait speed  and endurance and be able to ambulate 950 feet in 6 minutes to negotiate around the home and community safely.   Baseline 780 feet at evaluation.   Time 4   Period Weeks   Status New   OT LONG TERM GOAL #2   Title Patient will complete HEP for maximal daily exercises with modified independence    Baseline no current program   Time 4   Period Weeks   Status New   OT LONG TERM GOAL #3   Title Patient will transfer from sit to stand without the use of arms safely and independently from a variety of chairs/surfaces.   Baseline difficulty from low surfaces.   Time 4   Period Weeks   Status New   OT LONG TERM GOAL #4   Title Patient will demonstrate improved balance for functional daily tasks with a score on Berg Balance test greater than 50/56.   Baseline 45/56 at initial evaluation.   Time 4   Period Weeks   Status New   OT LONG TERM GOAL #5   Title Patient will demonstrate a decrease in hesistation and freezing behaviors with initiation and termination of gait and with turning behaviors as evidenced of a score lower than 10 on the Freezing of Gait Questionairre.   Baseline score of 14 on FOG at eval.    Time 4   Period Weeks   Status New   Long Term Additional Goals   Additional Long Term Goals Yes   OT LONG TERM GOAL #6   Title Patient will self feed with minimal spillage using utensils and complete hand to mouth patterns with 90% accuracy or greater.    Baseline spills each meal and unable to control utensil to mouth.   Time 4   Period Weeks   Status New   OT LONG TERM GOAL #7   Title Patient will demonstrate improved external rotation of BUE to comb the back of his hair independently.   Baseline difficulty reaching back of head at eval.   Time 4   Period Weeks   Status New   OT LONG TERM GOAL #8   Title Patient will negotiate 5 steps safely demonstrating a reciprocal LE patterns with good form.   Baseline decreased balance and safety with stair negotiation at eval.    Time 4   Period Weeks   Status New               Plan - 05/01/15 1121    Clinical Impression Statement Patient continues to require CGA and cues for daily exercises, especially those in standing.  He is becoming more consistent at completing exercises 2 times a day.  Added in  leg press this date with 100# for 10 reps.  Cues for amplitude of gait especially when patient is fatigued.     Pt will benefit from skilled therapeutic intervention in order to improve on the following deficits (Retired) Decreased coordination;Decreased range of motion;Difficulty walking;Decreased endurance;Decreased balance;Impaired UE functional use;Decreased mobility;Decreased strength   Rehab Potential Good   OT Frequency 4x / week   OT Duration 4 weeks   OT Treatment/Interventions Self-care/ADL training;Therapeutic exercise;Functional Mobility Training;Patient/family education;Neuromuscular education;Balance training;DME and/or AE instruction;Gait Training;Stair Training   Consulted and Agree with Plan of Care Patient        Problem List Patient Active Problem List   Diagnosis Date Noted  . Hepatitis A 04/02/2015  . Allergic rhinitis 04/02/2015  . Arthritis 04/02/2015  . Narrowing of intervertebral disc space 04/02/2015  . 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 Accumulation of fluid in tissues 04/02/2015  . Essential (primary) hypertension 04/02/2015  . Borderline diabetes 04/02/2015  . HLD (hyperlipidemia) 04/02/2015  . BP (high blood pressure) 04/02/2015  . Infected sebaceous cyst 04/02/2015  . Hernia, inguinal 04/02/2015  . Leg pain 04/02/2015  . Lumbar canal stenosis 04/02/2015  . 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 Malignant melanoma ([place]) 04/02/2015  . Arthritis, degenerative 04/02/2015  . Fatigue 04/02/2015  . Idiopathic Parkinson's disease ([place]) 04/02/2015  . Plantar fasciitis 04/02/2015  . CA of prostate ([place]) 04/02/2015  . Benign essential tremor 04/02/2015  .  Lower urinary tract infection 04/02/2015   Amy T Tomasita Morrow, OTR/L, CLT [place],Amy 05/02/2015, 10:48 AM  [place] MAIN [facility] SERVICES [address] [place], [place], [address] Phone: [phone]   Fax:  [phone]  Name: Roger Rodriguez MRN: [id number] Date of Birth: 1940-12-08

## 2015-05-03 NOTE — Therapy (Signed)
[place] MAIN [facility] SERVICES [address] [place], [place], [address] Phone: [phone]   Fax:  [phone]  Occupational Therapy Treatment  Patient Details  Name: Roger Rodriguez MRN: [id number] Date of Birth: Apr 17, 1941 No Data Recorded  Encounter Date: 05/02/2015      OT End of Session - 05/03/15 0926    Visit Number 8   Number of Visits 17   Date for OT Re-Evaluation 05/07/15   Authorization Type Medicare G code 8   OT Start Time 1100   OT Stop Time 1200   OT Time Calculation (min) 60 min   Activity Tolerance Patient tolerated treatment well   Behavior During Therapy [facility] for tasks assessed/performed      Past Medical History  Diagnosis Date  . Hyperlipidemia   . Hypertension   . Inguinal hernia   . Arthritis     OA  . Allergy     allergic rhinitis  . Cancer [facility])     Prostate  . Parkinson's disease [facility])     Past Surgical History  Procedure Laterality Date  . Rotator cuff tear  1993    Repaired  . H/o arthroscopy of right knee    . H/o radical protatectomy    . Vasectomy      History    There were no vitals filed for this visit.  Visit Diagnosis:  Muscle weakness (generalized)  Lack of coordination  Self-care deficit for dressing and grooming  Unsteady gait      Subjective Assessment - 05/02/15 1105    Subjective  Patient reports he is doing well, thought he lost his wallet the other day and realized he left it in the bathroom at the hospital, by the time he went back it was still there thankfully.     Patient Stated Goals Patient reports he wants to walk better, have good balance and to be independent.    Currently in Pain? No/denies   Pain Score 0-No pain                      OT Treatments/Exercises (OP) - 05/02/15 1608    ADLs   ADL Comments Functional component tasks, managing curbs, sit to stand, hand to mouth patterns for feeding, reaching the back of the head.   Neurological  Re-education Exercises   Other Exercises 1 Patient seen for initial instruction of LSVT BIG exercises: LSVT Daily Session Maximal Daily Exercises: Sustained movements are designed to rescale the amplitude of movement output for generalization to daily functional activities. Performed as follows for 1 set of 10 repetitions each: Multi directional sustained movements- 1) Floor to ceiling, 2) Side to side. Multi directional Repetitive movements performed in standing and are designed to provide retraining effort needed for sustained muscle activation in tasks Performed as follows: 3) Step and reach forward, 4) Step and Reach Backwards, 5) Step and reach sideways, 6) Rock and reach forward/backward, 7) Rock and reach sideways. Sit to stand from mat table on lowest setting with cues for weight shift, technique and CGA for 5 reps for 2 sets. Patient seen for functional mobility tasks this date with emphasis on gait speed, length of steps with short distance ambulation. Patient requires cues for BIG movements and cadenceAll standing exercises performed this date with CGA, verbal and tactile cues as needed   Other Exercises 2 Functional mobility utilizing LSVT BIG techniques for 2 trials of 350 feet, cues for increasing amplitude of steps as  well as reciprocal arm swing. Performed outdoors with CGA for sloped areas and uneven surfaces. when patient fatigues he tends to shuffle more and requires cues for amplitude of steps                OT Education - 05/02/15 1200    Education provided Yes   Education Details daily exercises, balance acts, HEP, amplitude of gait   Person(s) Educated Patient   Methods Explanation;Demonstration;Verbal cues   Comprehension Verbal cues required;Returned demonstration;Verbalized understanding             OT Long Term Goals - 04/02/15 1548    OT LONG TERM GOAL #1   Title Patient will improve gait speed and endurance and be able to ambulate 950 feet in 6 minutes to  negotiate around the home and community safely.   Baseline 780 feet at evaluation.   Time 4   Period Weeks   Status New   OT LONG TERM GOAL #2   Title Patient will complete HEP for maximal daily exercises with modified independence    Baseline no current program   Time 4   Period Weeks   Status New   OT LONG TERM GOAL #3   Title Patient will transfer from sit to stand without the use of arms safely and independently from a variety of chairs/surfaces.   Baseline difficulty from low surfaces.   Time 4   Period Weeks   Status New   OT LONG TERM GOAL #4   Title Patient will demonstrate improved balance for functional daily tasks with a score on Berg Balance test greater than 50/56.   Baseline 45/56 at initial evaluation.   Time 4   Period Weeks   Status New   OT LONG TERM GOAL #5   Title Patient will demonstrate a decrease in hesistation and freezing behaviors with initiation and termination of gait and with turning behaviors as evidenced of a score lower than 10 on the Freezing of Gait Questionairre.   Baseline score of 14 on FOG at eval.    Time 4   Period Weeks   Status New   Long Term Additional Goals   Additional Long Term Goals Yes   OT LONG TERM GOAL #6   Title Patient will self feed with minimal spillage using utensils and complete hand to mouth patterns with 90% accuracy or greater.    Baseline spills each meal and unable to control utensil to mouth.   Time 4   Period Weeks   Status New   OT LONG TERM GOAL #7   Title Patient will demonstrate improved external rotation of BUE to comb the back of his hair independently.   Baseline difficulty reaching back of head at eval.   Time 4   Period Weeks   Status New   OT LONG TERM GOAL #8   Title Patient will negotiate 5 steps safely demonstrating a reciprocal LE patterns with good form.   Baseline decreased balance and safety with stair negotiation at eval.   Time 4   Period Weeks   Status New                Plan - 05/02/15 1200    Clinical Impression Statement Patient able to demo exercises with minimal cues, able to recall exercises and sequencing of exercises, still needs CGA for exs in standing for balance.  Leg press 120# this date for 2 sets of 10 reps.  Cues for functional mobility in regards to  amplitude of steps.     Pt will benefit from skilled therapeutic intervention in order to improve on the following deficits (Retired) Decreased coordination;Decreased range of motion;Difficulty walking;Decreased endurance;Decreased balance;Impaired UE functional use;Decreased mobility;Decreased strength   Rehab Potential Good   OT Frequency 4x / week   OT Duration 4 weeks   OT Treatment/Interventions Self-care/ADL training;Therapeutic exercise;Functional Mobility Training;Patient/family education;Neuromuscular education;Balance training;DME and/or AE instruction;Gait Training;Stair Training   Consulted and Agree with Plan of Care Patient        Problem List Patient Active Problem List   Diagnosis Date Noted  . Hepatitis A 04/02/2015  . Allergic rhinitis 04/02/2015  . Arthritis 04/02/2015  . Narrowing of intervertebral disc space 04/02/2015  . 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 Accumulation of fluid in tissues 04/02/2015  . Essential (primary) hypertension 04/02/2015  . Borderline diabetes 04/02/2015  . HLD (hyperlipidemia) 04/02/2015  . BP (high blood pressure) 04/02/2015  . Infected sebaceous cyst 04/02/2015  . Hernia, inguinal 04/02/2015  . Leg pain 04/02/2015  . Lumbar canal stenosis 04/02/2015  . 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 Malignant melanoma ([place]) 04/02/2015  . Arthritis, degenerative 04/02/2015  . Fatigue 04/02/2015  . Idiopathic Parkinson's disease ([place]) 04/02/2015  . Plantar fasciitis 04/02/2015  . CA of prostate ([place]) 04/02/2015  . Benign essential tremor 04/02/2015  . Lower urinary tract infection 04/02/2015   Amy T Tomasita Morrow, OTR/L,  CLT  Lovett,Amy 05/03/2015, 4:11 PM  [place] MAIN [facility] SERVICES [address] [place], [place], [address] Phone: [phone]   Fax:  [phone]  Name: Roger Rodriguez MRN: [id number] Date of Birth: October 20, 1940

## 2015-05-07 ENCOUNTER — Ambulatory Visit: Payer: PPO | Admitting: Speech Pathology

## 2015-05-07 ENCOUNTER — Ambulatory Visit: Payer: PPO | Admitting: Occupational Therapy

## 2015-05-07 ENCOUNTER — Encounter: Payer: Self-pay | Admitting: Speech Pathology

## 2015-05-07 DIAGNOSIS — R2681 Unsteadiness on feet: Secondary | ICD-10-CM

## 2015-05-07 DIAGNOSIS — R279 Unspecified lack of coordination: Secondary | ICD-10-CM

## 2015-05-07 DIAGNOSIS — R490 Dysphonia: Secondary | ICD-10-CM | POA: Diagnosis not present

## 2015-05-07 DIAGNOSIS — M6281 Muscle weakness (generalized): Secondary | ICD-10-CM

## 2015-05-07 DIAGNOSIS — R460 Very low level of personal hygiene: Secondary | ICD-10-CM

## 2015-05-07 DIAGNOSIS — Z741 Need for assistance with personal care: Secondary | ICD-10-CM

## 2015-05-07 NOTE — Therapy (Signed)
[place] MAIN [facility] SERVICES [address] [place], [place], [address] Phone: [phone]   Fax:  [phone]  Speech Language Pathology Treatment  Patient Details  Name: Roger Rodriguez MRN: [id number] Date of Birth: May 26, 1941 Referring Provider: Vladimir Crofts, MD  Encounter Date: 05/07/2015      End of Session - 05/07/15 1123    Visit Number 9   Number of Visits 17   Date for SLP Re-Evaluation 05/10/15   SLP Start Time 0910   SLP Stop Time  0958   SLP Time Calculation (min) 48 min      Past Medical History  Diagnosis Date  . Hyperlipidemia   . Hypertension   . Inguinal hernia   . Arthritis     OA  . Allergy     allergic rhinitis  . Cancer [facility])     Prostate  . Parkinson's disease [facility])     Past Surgical History  Procedure Laterality Date  . Rotator cuff tear  1993    Repaired  . H/o arthroscopy of right knee    . H/o radical protatectomy    . Vasectomy      History    There were no vitals filed for this visit.  Visit Diagnosis: Dysphonia      Subjective Assessment - 05/07/15 1122    Subjective The patient is becoming more tolerant of how loud he is being asked to talk.   Currently in Pain? No/denies           SLP Evaluation [facility] - 05/07/15 0001    SLP Visit Information   Referring Provider Vladimir Crofts, MD            ADULT SLP TREATMENT - 05/07/15 0001    General Information   Behavior/Cognition Alert;Cooperative;Pleasant mood   Treatment Provided   Treatment provided Cognitive-Linquistic   Pain Assessment   Pain Assessment No/denies pain   Cognitive-Linquistic Treatment   Treatment focused on Voice   Skilled Treatment Daily Task #1: Average 17 seconds, 85 dB. Daily Task 2: Highs: 15 high pitched "ah" given min cues. Lows: 15 low pitched "ah" given min cues. Daily task #3: Average 73 dB.  Hierarchal speech loudness drill: Read words/sentences, 73 dB. Generate phrase to complete  cognitive-linguistic task, 73 dB.  Homework: assignments completed.  Off the cuff remarks: average 65 dB (but up to 72 dB with min reminder to be loud).   Assessment / Recommendations / Plan   Plan Continue with current plan of care   Progression Toward Goals   Progression toward goals Progressing toward goals          SLP Education - 05/07/15 1123    Education provided Yes   Education Details LSVT-LOUD   Person(s) Educated Patient   Methods Explanation;Demonstration;Verbal cues;Handout   Comprehension Verbalized understanding;Returned demonstration            SLP Long Term Goals - 04/03/15 1055    SLP LONG TERM GOAL #1   Title The patient will complete Daily Tasks (Maximum duration "ah", High/Lows, and Functional Phrases) at average loudness of 80 dB and with loud, good quality voice.    Time 4   Period Weeks   Status New   SLP LONG TERM GOAL #2   Title The patient will complete Hierarchal Speech Loudness reading drills (words/phrases, sentences, and paragraph) at average 75 dB and with loud, good quality voice.     Time 4   Period Weeks  Status New   SLP LONG TERM GOAL #3   Title The patient will complete homework daily.   Time 4   Period Weeks   Status New   SLP LONG TERM GOAL #4   Title The patient will participate in conversation, maintaining average loudness of 75 dB and loud, good quality voice.   Time 4   Period Weeks   Status New          Plan - 05/07/15 1124    Clinical Impression Statement The patient is completing daily tasks and hierarchal speech drill tasks with loud, good quality voice given fewer SLP cues.  He is demonstrating generalization into conversation given reminders.    Speech Therapy Frequency 4x / week   Duration 4 weeks   Treatment/Interventions Other (comment)  LSVT-LOUD    Potential to Achieve Goals Good   Potential Considerations Ability to learn/carryover information;Cooperation/participation level;Previous level of  function;Severity of impairments;Family/community support;Other (comment)   SLP Home Exercise Plan LSVT-LOUD daily homework   Consulted and Agree with Plan of Care Patient        Problem List Patient Active Problem List   Diagnosis Date Noted  . Hepatitis A 04/02/2015  . Allergic rhinitis 04/02/2015  . Arthritis 04/02/2015  . Narrowing of intervertebral disc space 04/02/2015  . 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 Accumulation of fluid in tissues 04/02/2015  . Essential (primary) hypertension 04/02/2015  . Borderline diabetes 04/02/2015  . HLD (hyperlipidemia) 04/02/2015  . BP (high blood pressure) 04/02/2015  . Infected sebaceous cyst 04/02/2015  . Hernia, inguinal 04/02/2015  . Leg pain 04/02/2015  . Lumbar canal stenosis 04/02/2015  . 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 Malignant melanoma ([place]) 04/02/2015  . Arthritis, degenerative 04/02/2015  . Fatigue 04/02/2015  . Idiopathic Parkinson's disease ([place]) 04/02/2015  . Plantar fasciitis 04/02/2015  . CA of prostate ([place]) 04/02/2015  . Benign essential tremor 04/02/2015  . Lower urinary tract infection 04/02/2015   Leroy Sea, MS/CCC- SLP  Lou Miner 05/07/2015, 11:25 AM  [place] MAIN [facility] SERVICES [address] [place], [place], [address] Phone: [phone]   Fax:  [phone]   Name: Roger Rodriguez MRN: [id number] Date of Birth: 05/30/1941

## 2015-05-08 ENCOUNTER — Ambulatory Visit: Payer: PPO | Admitting: Occupational Therapy

## 2015-05-08 DIAGNOSIS — R279 Unspecified lack of coordination: Secondary | ICD-10-CM

## 2015-05-08 DIAGNOSIS — Z741 Need for assistance with personal care: Secondary | ICD-10-CM

## 2015-05-08 DIAGNOSIS — M6281 Muscle weakness (generalized): Secondary | ICD-10-CM

## 2015-05-08 DIAGNOSIS — R460 Very low level of personal hygiene: Secondary | ICD-10-CM

## 2015-05-08 DIAGNOSIS — R490 Dysphonia: Secondary | ICD-10-CM | POA: Diagnosis not present

## 2015-05-08 DIAGNOSIS — R2681 Unsteadiness on feet: Secondary | ICD-10-CM

## 2015-05-08 NOTE — Therapy (Signed)
[place] MAIN [facility] SERVICES [address] [place], [place], [address] Phone: [phone]   Fax:  [phone]  Occupational Therapy Treatment Occupational Therapy Progress Note 04/02/2015 to 05/08/2015   Patient Details  Name: Roger Rodriguez MRN: [id number] Date of Birth: [birth year] No Data Recorded  Encounter Date: 05/08/2015      OT End of Session - 05/08/15 1604    Visit Number 10   Number of Visits 17   Date for OT Re-Evaluation 06/04/15   Authorization Type Medicare G code 10   OT Start Time 1001   OT Stop Time 1058   OT Time Calculation (min) 57 min   Activity Tolerance Patient tolerated treatment well   Behavior During Therapy [facility] for tasks assessed/performed      Past Medical History  Diagnosis Date  . Hyperlipidemia   . Hypertension   . Inguinal hernia   . Arthritis     OA  . Allergy     allergic rhinitis  . Cancer [facility])     Prostate  . Parkinson's disease [facility])     Past Surgical History  Procedure Laterality Date  . Rotator cuff tear  1993    Repaired  . H/o arthroscopy of right knee    . H/o radical protatectomy    . Vasectomy      History    There were no vitals filed for this visit.  Visit Diagnosis:  Muscle weakness (generalized)  Lack of coordination  Self-care deficit for dressing and grooming  Unsteady gait      Subjective Assessment - 05/08/15 1601    Subjective  Patient reports he is doing better today, knee is not hurting like it was yesterday.  Needs to do some yardwork but doesn't think he can do it today.   Patient Stated Goals Patient reports he wants to walk better, have good balance and to be independent.    Currently in Pain? No/denies   Pain Score 0-No pain                      OT Treatments/Exercises (OP) - 05/08/15 1602    ADLs   ADL Comments Continued focus on  Functional component tasks of sit to stand without the use of arms, hand to mouth patterns  with spoon, reaching the back of the head on the left    Neurological Re-education Exercises   Other Exercises 1 Patient seen for initial instruction of LSVT BIG exercises: LSVT Daily Session Maximal Daily Exercises: Sustained movements are designed to rescale the amplitude of movement output for generalization to daily functional activities. Performed as follows for 1 set of 10 repetitions each: Multi directional sustained movements- 1) Floor to ceiling, 2) Side to side. Multi directional Repetitive movements performed in standing and are designed to provide retraining effort needed for sustained muscle activation in tasks Performed as follows: 3) Step and reach forward, 4) Step and Reach Backwards, 5) Step and reach sideways, 6) Rock and reach forward/backward, 7) Rock and reach sideways. Sit to stand from mat table on lowest setting with cues for weight shift, technique and CGA for 5 reps for 2 sets. Patient seen for functional mobility tasks this date with emphasis on gait speed, length of steps with short distance ambulation. Patient requires cues for BIG movements and cadenceAll standing exercises performed this date with CGA, verbal and tactile cues as needed   Other Exercises 2 6 minute walk test this date, 875 feet.  5 times sit to stand 32 secs.                OT Education - 05/08/15 1604    Education provided Yes   Education Details HEP, results of 6 minute walk test   Person(s) Educated Patient   Methods Explanation;Demonstration;Verbal cues   Comprehension Verbal cues required;Returned demonstration;Verbalized understanding             OT Long Term Goals - 05/08/15 1605    OT LONG TERM GOAL #1   Title Patient will improve gait speed and endurance and be able to ambulate 950 feet in 6 minutes to negotiate around the home and community safely.   Baseline 875 feet at 10th visit   Time 4   Period Weeks   Status On-going   OT LONG TERM GOAL #2   Title Patient will  complete HEP for maximal daily exercises with modified independence    Time 4   Period Weeks   Status Partially Met   OT LONG TERM GOAL #3   Title Patient will transfer from sit to stand without the use of arms safely and independently from a variety of chairs/surfaces.   Baseline 5 times sit to stand at eval 48 secs, at 10th visit 32 secs   Time 4   Period Weeks   Status Partially Met   OT LONG TERM GOAL #4   Title Patient will demonstrate improved balance for functional daily tasks with a score on Berg Balance test greater than 50/56.   Time 4   Period Weeks   Status On-going   OT LONG TERM GOAL #5   Title Patient will demonstrate a decrease in hesistation and freezing behaviors with initiation and termination of gait and with turning behaviors as evidenced of a score lower than 10 on the Freezing of Gait Questionairre.   Baseline score of 14 on FOG at eval, score of 12 at 10th visit   Time 4   Period Weeks   Status On-going   OT LONG TERM GOAL #6   Title Patient will self feed with minimal spillage using utensils and complete hand to mouth patterns with 90% accuracy or greater.    Time 4   Period Weeks   Status Partially Met   OT LONG TERM GOAL #7   Title Patient will demonstrate improved external rotation of BUE to comb the back of his hair independently.   Time 4   Period Weeks   Status Partially Met   OT LONG TERM GOAL #8   Title Patient will negotiate 5 steps safely demonstrating a reciprocal LE patterns with good form.   Time 4   Period Weeks   Status On-going               Plan - 05/08/15 1605    Clinical Impression Statement Patient continues to progress, reassessment completed this date and patient improved with 6 minute walk test as well as 5 times sit to stand.  Amplitude of gait has improved and requiring decreased cues and showing signs of self reflection when he doesn't perform as directed.  He continues to benefit from skilled OT intervention for  LSVT BIG program.   Pt will benefit from skilled therapeutic intervention in order to improve on the following deficits (Retired) Decreased coordination;Decreased range of motion;Difficulty walking;Decreased endurance;Decreased balance;Impaired UE functional use;Decreased mobility;Decreased strength   Rehab Potential Good   OT Frequency 4x / week   OT Duration 4 weeks  OT Treatment/Interventions Self-care/ADL training;Therapeutic exercise;Functional Mobility Training;Patient/family education;Neuromuscular education;Balance training;DME and/or AE instruction;Gait Training;Stair Training   Consulted and Agree with Plan of Care Patient          G-Codes - 05/10/15 1608    Functional Assessment Tool Used clinical judgment, 5 times sit to stand, 6 minute walk test, Freezing of gait questionairre   Functional Limitation Mobility: Walking and moving around   Mobility: Walking and Moving Around Current Status [phone]) At least 40 percent but less than 60 percent impaired, limited or restricted   Mobility: Walking and Moving Around Goal Status [phone]) At least 20 percent but less than 40 percent impaired, limited or restricted      Problem List Patient Active Problem List   Diagnosis Date Noted  . Hepatitis A 04/02/2015  . Allergic rhinitis 04/02/2015  . Arthritis 04/02/2015  . Narrowing of intervertebral disc space 04/02/2015  . 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 Accumulation of fluid in tissues 04/02/2015  . Essential (primary) hypertension 04/02/2015  . Borderline diabetes 04/02/2015  . HLD (hyperlipidemia) 04/02/2015  . BP (high blood pressure) 04/02/2015  . Infected sebaceous cyst 04/02/2015  . Hernia, inguinal 04/02/2015  . Leg pain 04/02/2015  . Lumbar canal stenosis 04/02/2015  . 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 Malignant melanoma ([place]) 04/02/2015  . Arthritis, degenerative 04/02/2015  . Fatigue 04/02/2015  . Idiopathic Parkinson's disease ([place]) 04/02/2015   . Plantar fasciitis 04/02/2015  . CA of prostate ([place]) 04/02/2015  . Benign essential tremor 04/02/2015  . Lower urinary tract infection 04/02/2015   Tineshia Becraft T Tomasita Morrow, OTR/L, CLT Tanya Marvin 05/10/15, 4:12 PM  [place] MAIN [facility] SERVICES [address] [place], [place], [address] Phone: [phone]   Fax:  [phone]  Name: Gilford Lardizabal MRN: [id number] Date of Birth: [birth year]

## 2015-05-08 NOTE — Therapy (Signed)
[place] MAIN [facility] SERVICES [address] [place], [place], [address] Phone: [phone]   Fax:  [phone]  Occupational Therapy Treatment  Patient Details  Name: Roger Rodriguez MRN: [id number] Date of Birth: [birth year] No Data Recorded  Encounter Date: 05/07/2015      OT End of Session - 05/07/15 1100    Visit Number 9   Number of Visits 17   Date for OT Re-Evaluation 05/07/15   Authorization Type Medicare G code 9   OT Start Time 1000   OT Stop Time 1059   OT Time Calculation (min) 59 min   Activity Tolerance Patient tolerated treatment well   Behavior During Therapy [facility] for tasks assessed/performed      Past Medical History  Diagnosis Date  . Hyperlipidemia   . Hypertension   . Inguinal hernia   . Arthritis     OA  . Allergy     allergic rhinitis  . Cancer [facility])     Prostate  . Parkinson's disease [facility])     Past Surgical History  Procedure Laterality Date  . Rotator cuff tear  1993    Repaired  . H/o arthroscopy of right knee    . H/o radical protatectomy    . Vasectomy      History    There were no vitals filed for this visit.  Visit Diagnosis:  Muscle weakness (generalized) - Plan: Ot plan of care cert/re-cert  Lack of coordination - Plan: Ot plan of care cert/re-cert  Self-care deficit for dressing and grooming - Plan: Ot plan of care cert/re-cert  Unsteady gait - Plan: Ot plan of care cert/re-cert      Subjective Assessment - 05/07/15 1005    Subjective  Patient reports he had a ok weekend, his family was out of town and is was really quiet.  He has been trying to get thank you notes completed.  He has been doing exercises at home.   Patient Stated Goals Patient reports he wants to walk better, have good balance and to be independent.    Currently in Pain? No/denies   Pain Score 0-No pain                      OT Treatments/Exercises (OP) - 05/07/15 1000    ADLs   ADL Comments  Functional component tasks, managing curbs, sit to stand, hand to mouth patterns for feeding, reaching the back of the head.   Neurological Re-education Exercises   Other Exercises 1 Patient seen for initial instruction of LSVT BIG exercises: LSVT Daily Session Maximal Daily Exercises: Sustained movements are designed to rescale the amplitude of movement output for generalization to daily functional activities. Performed as follows for 1 set of 10 repetitions each: Multi directional sustained movements- 1) Floor to ceiling, 2) Side to side. Multi directional Repetitive movements performed in standing and are designed to provide retraining effort needed for sustained muscle activation in tasks Performed as follows: 3) Step and reach forward, 4) Step and Reach Backwards, 5) Step and reach sideways, 6) Rock and reach forward/backward, 7) Rock and reach sideways. Sit to stand from mat table on lowest setting with cues for weight shift, technique and CGA for 5 reps for 2 sets. Patient seen for functional mobility tasks this date with emphasis on gait speed, length of steps with short distance ambulation. Patient requires cues for BIG movements and cadenceAll standing exercises performed this date with CGA, verbal and tactile  cues as needed   Other Exercises 2 Functional mobility for 3 sets of 200 feet, patient tending to drag right foot more this date and requiring increased cues and rest break.                  OT Education - 05/08/15 0931    Education provided Yes   Education Details LSVT BIG principles, amplitude of gait   Person(s) Educated Patient   Methods Explanation;Demonstration;Verbal cues   Comprehension Verbal cues required;Returned demonstration;Verbalized understanding             OT Long Term Goals - 05/07/15 1100    OT LONG TERM GOAL #1   Time 4   Period Weeks   Status On-going   OT LONG TERM GOAL #2   Title Patient will complete HEP for maximal daily exercises with  modified independence    Time 4   Period Weeks   Status Partially Met   OT LONG TERM GOAL #3   Title Patient will transfer from sit to stand without the use of arms safely and independently from a variety of chairs/surfaces.   Baseline difficulty from low surfaces.   Time 4   Period Weeks   Status Partially Met   OT LONG TERM GOAL #4   Title Patient will demonstrate improved balance for functional daily tasks with a score on Berg Balance test greater than 50/56.   Time 4   Period Weeks   Status On-going   OT LONG TERM GOAL #5   Title Patient will demonstrate a decrease in hesistation and freezing behaviors with initiation and termination of gait and with turning behaviors as evidenced of a score lower than 10 on the Freezing of Gait Questionairre.   Time 4   Period Weeks   Status On-going   OT LONG TERM GOAL #6   Title Patient will self feed with minimal spillage using utensils and complete hand to mouth patterns with 90% accuracy or greater.    Time 4   Period Weeks   Status Partially Met   OT LONG TERM GOAL #7   Title Patient will demonstrate improved external rotation of BUE to comb the back of his hair independently.   Time 4   Period Weeks   Status Partially Met   OT LONG TERM GOAL #8   Title Patient will negotiate 5 steps safely demonstrating a reciprocal LE patterns with good form.   Time 4   Period Weeks   Status On-going               Plan - 05/07/15 1100    Clinical Impression Statement Pt continues to make good progress with improved amplitude of gait, sit to stand from a variety of surfaces, balance and coordination for self care tasks.  He had a disruption in therapy with the recent death of his wife and has  just started back to complete the program.  Requesting extension of dates for certification period to complete his original requested visits.  He will continue to benefit from skilled therapy of LSVT BIG program to improve his functional mobility,  functional transfers, balance and self care tasks.   Pt will benefit from skilled therapeutic intervention in order to improve on the following deficits (Retired) Decreased coordination;Decreased range of motion;Difficulty walking;Decreased endurance;Decreased balance;Impaired UE functional use;Decreased mobility;Decreased strength   Rehab Potential Good   OT Frequency 4x / week   OT Duration 4 weeks   OT Treatment/Interventions Self-care/ADL training;Therapeutic exercise;Functional Mobility  Training;Patient/family education;Neuromuscular education;Balance training;DME and/or AE instruction;Gait Training;Stair Training   Consulted and Agree with Plan of Care Patient        Problem List Patient Active Problem List   Diagnosis Date Noted  . Hepatitis A 04/02/2015  . Allergic rhinitis 04/02/2015  . Arthritis 04/02/2015  . Narrowing of intervertebral disc space 04/02/2015  . 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 Accumulation of fluid in tissues 04/02/2015  . Essential (primary) hypertension 04/02/2015  . Borderline diabetes 04/02/2015  . HLD (hyperlipidemia) 04/02/2015  . BP (high blood pressure) 04/02/2015  . Infected sebaceous cyst 04/02/2015  . Hernia, inguinal 04/02/2015  . Leg pain 04/02/2015  . Lumbar canal stenosis 04/02/2015  . 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 Malignant melanoma ([place]) 04/02/2015  . Arthritis, degenerative 04/02/2015  . Fatigue 04/02/2015  . Idiopathic Parkinson's disease ([place]) 04/02/2015  . Plantar fasciitis 04/02/2015  . CA of prostate ([place]) 04/02/2015  . Benign essential tremor 04/02/2015  . Lower urinary tract infection 04/02/2015   Korry Dalgleish T Tomasita Morrow, OTR/L, CLT Uyen Eichholz 05/08/2015, 9:43 AM  [place] MAIN [facility] SERVICES [address] [place], [place], [address] Phone: [phone]   Fax:  [phone]  Name: Roger Rodriguez MRN: [id number] Date of Birth: [birth year]

## 2015-05-09 ENCOUNTER — Ambulatory Visit: Payer: PPO | Admitting: Occupational Therapy

## 2015-05-09 ENCOUNTER — Encounter: Payer: Self-pay | Admitting: Speech Pathology

## 2015-05-09 ENCOUNTER — Ambulatory Visit: Payer: PPO | Admitting: Speech Pathology

## 2015-05-09 ENCOUNTER — Encounter: Payer: Self-pay | Admitting: Occupational Therapy

## 2015-05-09 DIAGNOSIS — M6281 Muscle weakness (generalized): Secondary | ICD-10-CM

## 2015-05-09 DIAGNOSIS — Z741 Need for assistance with personal care: Secondary | ICD-10-CM

## 2015-05-09 DIAGNOSIS — R460 Very low level of personal hygiene: Secondary | ICD-10-CM

## 2015-05-09 DIAGNOSIS — R490 Dysphonia: Secondary | ICD-10-CM

## 2015-05-09 DIAGNOSIS — R279 Unspecified lack of coordination: Secondary | ICD-10-CM

## 2015-05-09 DIAGNOSIS — R2681 Unsteadiness on feet: Secondary | ICD-10-CM

## 2015-05-09 NOTE — Therapy (Signed)
[place] MAIN [facility] SERVICES [address] [place], [place], [address] Phone: [phone]   Fax:  [phone]  Speech Language Pathology Treatment/ Discharge Summary  Patient Details  Name: Roger Rodriguez MRN: [id number] Date of Birth: 01-Mar-1941 Referring Provider: Vladimir Crofts, MD  Encounter Date: 05/09/2015      End of Session - 05/09/15 1203    Visit Number 10   Number of Visits 17   Date for SLP Re-Evaluation 05/10/15   SLP Start Time 0908   SLP Stop Time  0958   SLP Time Calculation (min) 50 min   Activity Tolerance Patient tolerated treatment well      Past Medical History  Diagnosis Date  . Hyperlipidemia   . Hypertension   . Inguinal hernia   . Arthritis     OA  . Allergy     allergic rhinitis  . Cancer [facility])     Prostate  . Parkinson's disease [facility])     Past Surgical History  Procedure Laterality Date  . Rotator cuff tear  1993    Repaired  . H/o arthroscopy of right knee    . H/o radical protatectomy    . Vasectomy      History    There were no vitals filed for this visit.  Visit Diagnosis: Dysphonia      Subjective Assessment - 05/09/15 1201    Subjective The patient reports he has been trying to use his loud voice, but feels he is having more difficulty today because of fatigue.   Currently in Pain? No/denies               ADULT SLP TREATMENT - 05/09/15 0001    General Information   Behavior/Cognition Alert;Cooperative;Pleasant mood   Treatment Provided   Treatment provided Cognitive-Linquistic   Pain Assessment   Pain Assessment No/denies pain   Cognitive-Linquistic Treatment   Treatment focused on Voice   Skilled Treatment Daily Task #1: Average 16 seconds, 84 dB. Daily Task 2: Highs: 15 high pitched "ah" given min cues. Lows: 15 low pitched "ah" given min cues. Daily task #3: Average 73 dB.  Hierarchal speech loudness drill: Read sentences, 72 dB.  Homework: assignments  completed.  Off the cuff remarks: average 70 dB (but up to 74 dB with min reminder to be loud).   Assessment / Recommendations / Plan   Plan Discharge SLP treatment due to (comment)  pt met goals   Progression Toward Goals   Progression toward goals Goals met, education completed, patient discharged from [facility] [employer] Education - 05/09/15 1202    Education provided Yes   Education Details LSVT Loud, Discharge and forever homework   Person(s) Educated Patient   Methods Explanation;Handout;Demonstration   Comprehension Verbalized understanding            SLP Long Term Goals - 05/09/15 1207    SLP LONG TERM GOAL #1   Title The patient will complete Daily Tasks (Maximum duration "ah", High/Lows, and Functional Phrases) at average loudness of 80 dB and with loud, good quality voice.    Time 4   Period Weeks   Status Achieved   SLP LONG TERM GOAL #2   Title The patient will complete Hierarchal Speech Loudness reading drills (words/phrases, sentences, and paragraph) at average 75 dB and with loud, good quality voice.     Time 4   Period Weeks   Status Achieved  With  min reminders   SLP LONG TERM GOAL #3   Title The patient will complete homework daily.   Time 4   Period Weeks   Status Achieved   SLP LONG TERM GOAL #4   Title The patient will participate in conversation, maintaining average loudness of 75 dB and loud, good quality voice.   Time 4   Period Days   Status Achieved  with min reminders          Plan - 05/13/2015 1204    Clinical Impression Statement The patient has completed the LSVT-LOUD program, but had to miss 8 sessions due to illness and death of his wife. Pt is able to demonstrate loudness in conversation given reminders. Pt educated on home exercise program, "Forever Homework."   Speech Therapy Frequency 4x / week   Duration 4 weeks   Treatment/Interventions Other (comment)  LSVT-LOUD   Potential to Achieve Goals Good   Potential Considerations  Ability to learn/carryover information;Cooperation/participation level;Previous level of function;Severity of impairments;Family/community support;Other (comment)   SLP Home Exercise Plan LSVT LOUD, forever homework   Consulted and Agree with Plan of Care Patient          G-Codes - 05/13/2015 14-Sep-1207    Functional Assessment Tool Used Perceptual Voice Evaluation, clinical judgement   Functional Limitations Voice   Voice Current Status (G9171) At least 60 percent but less than 80 percent impaired, limited or restricted   Voice Goal Status (G9172) At least 1 percent but less than 20 percent impaired, limited or restricted   Voice Discharge Status (Z6109) At least 20 percent but less than 40 percent impaired, limited or restricted      Problem List Patient Active Problem List   Diagnosis Date Noted  . Hepatitis A 04/02/2015  . Allergic rhinitis 04/02/2015  . Arthritis 04/02/2015  . Narrowing of intervertebral disc space 04/02/2015  . 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 Accumulation of fluid in tissues 04/02/2015  . Essential (primary) hypertension 04/02/2015  . Borderline diabetes 04/02/2015  . HLD (hyperlipidemia) 04/02/2015  . BP (high blood pressure) 04/02/2015  . Infected sebaceous cyst 04/02/2015  . Hernia, inguinal 04/02/2015  . Leg pain 04/02/2015  . Lumbar canal stenosis 04/02/2015  . 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 Malignant melanoma ([place]) 04/02/2015  . Arthritis, degenerative 04/02/2015  . Fatigue 04/02/2015  . Idiopathic Parkinson's disease ([place]) 04/02/2015  . Plantar fasciitis 04/02/2015  . CA of prostate ([place]) 04/02/2015  . Benign essential tremor 04/02/2015  . Lower urinary tract infection 04/02/2015    [place],Shilo Pauwels 13-May-2015, 12:12 PM  [place] [address] [place], [place], [address] Phone: [phone]   Fax:  [phone]   Name: Roger Rodriguez MRN: [id number] Date of  Birth: 03-28-1941

## 2015-05-09 NOTE — Therapy (Signed)
[place] MAIN [facility] SERVICES [address] [place], [place], [address] Phone: [phone]   Fax:  [phone]  Occupational Therapy Treatment  Patient Details  Name: Roger Rodriguez MRN: [id number] Date of Birth: 21-Jul-1940 No Data Recorded  Encounter Date: 05/09/2015      OT End of Session - 05/09/15 1131    Visit Number 11   Number of Visits 17   Date for OT Re-Evaluation 06/04/15   Authorization Type Medicare G code 11   OT Start Time 8466   OT Stop Time 1100   OT Time Calculation (min) 58 min   Activity Tolerance Patient tolerated treatment well   Behavior During Therapy [facility] for tasks assessed/performed      Past Medical History  Diagnosis Date  . Hyperlipidemia   . Hypertension   . Inguinal hernia   . Arthritis     OA  . Allergy     allergic rhinitis  . Cancer [facility])     Prostate  . Parkinson's disease [facility])     Past Surgical History  Procedure Laterality Date  . Rotator cuff tear  1993    Repaired  . H/o arthroscopy of right knee    . H/o radical protatectomy    . Vasectomy      History    There were no vitals filed for this visit.  Visit Diagnosis:  Muscle weakness (generalized)  Lack of coordination  Self-care deficit for dressing and grooming  Unsteady gait      Subjective Assessment - 05/09/15 1128    Subjective  Patient reports he was able to get out and do some yard work yesterday and a little sore today but not pain.   Patient Stated Goals Patient reports he wants to walk better, have good balance and to be independent.    Currently in Pain? No/denies   Pain Score 0-No pain                      OT Treatments/Exercises (OP) - 05/09/15 1128    ADLs   ADL Comments Continued focus on Functional component tasks of sit to stand without the use of arms, hand to mouth patterns with spoon, reaching the back of the head on the left    Neurological Re-education Exercises   Other  Exercises 1 Patient seen for initial instruction of LSVT BIG exercises: LSVT Daily Session Maximal Daily Exercises: Sustained movements are designed to rescale the amplitude of movement output for generalization to daily functional activities. Performed as follows for 1 set of 10 repetitions each: Multi directional sustained movements- 1) Floor to ceiling, 2) Side to side. Multi directional Repetitive movements performed in standing and are designed to provide retraining effort needed for sustained muscle activation in tasks Performed as follows: 3) Step and reach forward, 4) Step and Reach Backwards, 5) Step and reach sideways, 6) Rock and reach forward/backward, 7) Rock and reach sideways. Sit to stand from mat table on lowest setting with cues for weight shift, technique and CGA for 5 reps for 2 sets. Patient seen for functional mobility tasks this date with emphasis on gait speed, length of steps with short distance ambulation. Patient requires cues for BIG movements and cadenceAll standing exercises performed this date with CGA, verbal and tactile cues as needed   Other Exercises 2 Patient seen for leg press 120# for 2 sets of 10 reps.  Stairs with cues for reciprocal lower extremity movements going up and  down and CGA to complete and use of one handrail.  Functional mobility for 1 set of 750 feet with no rest breaks this date but cues for reciprocal arm swing and amplitude of steps at times.                 OT Education - 05/09/15 1130    Education provided Yes   Education Details HEP, LSVT BIG principles with tasks.   Person(s) Educated Patient   Methods Explanation;Demonstration;Verbal cues;Tactile cues   Comprehension Verbalized understanding;Returned demonstration;Verbal cues required;Tactile cues required             OT Long Term Goals - 05/08/15 1605    OT LONG TERM GOAL #1   Title Patient will improve gait speed and endurance and be able to ambulate 950 feet in 6 minutes  to negotiate around the home and community safely.   Baseline 875 feet at 10th visit   Time 4   Period Weeks   Status On-going   OT LONG TERM GOAL #2   Title Patient will complete HEP for maximal daily exercises with modified independence    Time 4   Period Weeks   Status Partially Met   OT LONG TERM GOAL #3   Title Patient will transfer from sit to stand without the use of arms safely and independently from a variety of chairs/surfaces.   Baseline 5 times sit to stand at eval 48 secs, at 10th visit 32 secs   Time 4   Period Weeks   Status Partially Met   OT LONG TERM GOAL #4   Title Patient will demonstrate improved balance for functional daily tasks with a score on Berg Balance test greater than 50/56.   Time 4   Period Weeks   Status On-going   OT LONG TERM GOAL #5   Title Patient will demonstrate a decrease in hesistation and freezing behaviors with initiation and termination of gait and with turning behaviors as evidenced of a score lower than 10 on the Freezing of Gait Questionairre.   Baseline score of 14 on FOG at eval, score of 12 at 10th visit   Time 4   Period Weeks   Status On-going   OT LONG TERM GOAL #6   Title Patient will self feed with minimal spillage using utensils and complete hand to mouth patterns with 90% accuracy or greater.    Time 4   Period Weeks   Status Partially Met   OT LONG TERM GOAL #7   Title Patient will demonstrate improved external rotation of BUE to comb the back of his hair independently.   Time 4   Period Weeks   Status Partially Met   OT LONG TERM GOAL #8   Title Patient will negotiate 5 steps safely demonstrating a reciprocal LE patterns with good form.   Time 4   Period Weeks   Status On-going               Plan - 05/09/15 1131    Clinical Impression Statement Patient increased to 2 sets of 10 reps of leg press for strengthening this week and was able to negotiate steps with reciprocal pattern this date when he has  had difficulty in the past.  Cues for exercises and select ones in standing with CGA.  Functional mobility with increased distance and no rest break this date.    Pt will benefit from skilled therapeutic intervention in order to improve on the following deficits (Retired) Decreased  coordination;Decreased range of motion;Difficulty walking;Decreased endurance;Decreased balance;Impaired UE functional use;Decreased mobility;Decreased strength   Rehab Potential Good   OT Frequency 4x / week   OT Duration 4 weeks   OT Treatment/Interventions Self-care/ADL training;Therapeutic exercise;Functional Mobility Training;Patient/family education;Neuromuscular education;Balance training;DME and/or AE instruction;Gait Training;Stair Training   Consulted and Agree with Plan of Care Patient          G-Codes - 2015/05/15 1608    Functional Assessment Tool Used clinical judgment, 5 times sit to stand, 6 minute walk test, Freezing of gait questionairre   Functional Limitation Mobility: Walking and moving around   Mobility: Walking and Moving Around Current Status [phone]) At least 40 percent but less than 60 percent impaired, limited or restricted   Mobility: Walking and Moving Around Goal Status [phone]) At least 20 percent but less than 40 percent impaired, limited or restricted      Problem List Patient Active Problem List   Diagnosis Date Noted  . Hepatitis A 04/02/2015  . Allergic rhinitis 04/02/2015  . Arthritis 04/02/2015  . Narrowing of intervertebral disc space 04/02/2015  . 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 Accumulation of fluid in tissues 04/02/2015  . Essential (primary) hypertension 04/02/2015  . Borderline diabetes 04/02/2015  . HLD (hyperlipidemia) 04/02/2015  . BP (high blood pressure) 04/02/2015  . Infected sebaceous cyst 04/02/2015  . Hernia, inguinal 04/02/2015  . Leg pain 04/02/2015  . Lumbar canal stenosis 04/02/2015  . 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  Malignant melanoma ([place]) 04/02/2015  . Arthritis, degenerative 04/02/2015  . Fatigue 04/02/2015  . Idiopathic Parkinson's disease ([place]) 04/02/2015  . Plantar fasciitis 04/02/2015  . CA of prostate ([place]) 04/02/2015  . Benign essential tremor 04/02/2015  . Lower urinary tract infection 04/02/2015   Dior Stepter T Tomasita Morrow, OTR/L, CLT Otto Felkins 05/09/2015, 11:34 AM  [place] MAIN [facility] SERVICES [address] [place], [place], [address] Phone: [phone]   Fax:  [phone]  Name: Hussien Greenblatt MRN: [id number] Date of Birth: [birth year]

## 2015-05-21 ENCOUNTER — Ambulatory Visit: Payer: PPO | Attending: Neurology | Admitting: Occupational Therapy

## 2015-05-21 DIAGNOSIS — R460 Very low level of personal hygiene: Secondary | ICD-10-CM | POA: Diagnosis present

## 2015-05-21 DIAGNOSIS — R279 Unspecified lack of coordination: Secondary | ICD-10-CM

## 2015-05-21 DIAGNOSIS — R2681 Unsteadiness on feet: Secondary | ICD-10-CM

## 2015-05-21 DIAGNOSIS — M6281 Muscle weakness (generalized): Secondary | ICD-10-CM

## 2015-05-21 DIAGNOSIS — Z741 Need for assistance with personal care: Secondary | ICD-10-CM

## 2015-05-22 ENCOUNTER — Ambulatory Visit (INDEPENDENT_AMBULATORY_CARE_PROVIDER_SITE_OTHER): Payer: PPO | Admitting: Family Medicine

## 2015-05-22 ENCOUNTER — Encounter: Payer: Self-pay | Admitting: Family Medicine

## 2015-05-22 VITALS — BP 162/88 | HR 80 | Temp 97.6°F | Resp 16 | Wt 209.0 lb

## 2015-05-22 DIAGNOSIS — H9312 Tinnitus, left ear: Secondary | ICD-10-CM

## 2015-05-22 DIAGNOSIS — H8109 Meniere's disease, unspecified ear: Secondary | ICD-10-CM

## 2015-05-22 DIAGNOSIS — C61 Malignant neoplasm of prostate: Secondary | ICD-10-CM | POA: Insufficient documentation

## 2015-05-22 MED ORDER — HYDROCHLOROTHIAZIDE 12.5 MG PO TABS: 12.5000 mg | ORAL_TABLET | Freq: Every day | ORAL | Status: DC

## 2015-05-22 NOTE — Progress Notes (Signed)
Patient ID: Roger Rodriguez, male   DOB: [birth year], 74 y.o.   MRN: [id number]    Subjective:  HPI  Pt is here for tinnitus. He reports that he noticed it a little over a month ago, but really noticed it after his wife passed away and the house got quiet. At first he thought it was something in the house. It does not happen all the time. He reports that he wore a blue tooth ear piece in his ear for about 12 hours a day and thinks that could have contributed. He denies any hearing loss or dizziness.   Prior to Admission medications   Medication Sig Start Date End Date Taking? Authorizing Provider  aspirin 325 MG tablet Take by mouth.   Yes Historical Provider, MD  budesonide (RHINOCORT AQUA) 32 MCG/ACT nasal spray Place into the nose. 10/02/14  Yes Historical Provider, MD  carbidopa-levodopa (SINEMET CR) 50-200 MG per tablet TAKE 1 TABLET BY MOUTH 3 (THREE) TIMES DAILY. 03/21/15  Yes Historical Provider, MD  carbidopa-levodopa (SINEMET IR) 25-100 MG per tablet TAKE 2 TABLETS BY MOUTH 3 (THREE) TIMES DAILY. 01/29/15  Yes Historical Provider, MD  celecoxib (CELEBREX) 200 MG capsule TAKE 1 CAPSULE BY MOUTH TWICE DAILY 04/03/15  Yes Richard Maceo Pro., MD  cephALEXin (KEFLEX) 500 MG capsule TAKE 1 CAPSULE BY MOUTH EVERY 6 HOURS FOR 10 DAYS 01/05/15  Yes Historical Provider, MD  Cholecalciferol (VITAMIN D) 2000 UNITS tablet Take by mouth. 05/19/12  Yes Historical Provider, MD  furosemide (LASIX) 20 MG tablet Take 1 tablet by mouth daily. 02/05/14  Yes Historical Provider, MD  gabapentin (NEURONTIN) 400 MG capsule Take 400 mg by mouth 2 (two) times daily. 03/26/15  Yes Historical Provider, MD  loratadine (CLARITIN) 10 MG tablet Take 10 mg by mouth daily. 05/19/12  Yes Historical Provider, MD  mirabegron ER (MYRBETRIQ) 50 MG TB24 tablet Take by mouth.   Yes Historical Provider, MD  MYRBETRIQ 25 MG TB24 tablet TAKE 1 TO 2 TABLETS DAILY 01/09/15  Yes Historical Provider, MD  primidone (MYSOLINE) 50 MG tablet  Take 50 tablets by mouth 2 (two) times daily.   Yes Historical Provider, MD  sodium chloride (OCEAN) 0.65 % nasal spray Place into the nose.   Yes Historical Provider, MD  VITAMIN E NATURAL PO Take by mouth. 05/19/12  Yes Historical Provider, MD  lisinopril (PRINIVIL,ZESTRIL) 5 MG tablet Take 5 mg by mouth 2 (two) times daily. 01/29/15   Historical Provider, MD    Patient Active Problem List   Diagnosis Date Noted  . Malignant neoplasm of prostate ([place]) 05/22/2015  . Hepatitis A 04/02/2015  . Allergic rhinitis 04/02/2015  . Arthritis 04/02/2015  . Narrowing of intervertebral disc space 04/02/2015  . 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 Accumulation of fluid in tissues 04/02/2015  . Essential (primary) hypertension 04/02/2015  . Borderline diabetes 04/02/2015  . HLD (hyperlipidemia) 04/02/2015  . BP (high blood pressure) 04/02/2015  . Infected sebaceous cyst 04/02/2015  . Hernia, inguinal 04/02/2015  . Leg pain 04/02/2015  . Lumbar canal stenosis 04/02/2015  . 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 Malignant melanoma ([place]) 04/02/2015  . Arthritis, degenerative 04/02/2015  . Fatigue 04/02/2015  . Idiopathic Parkinson's disease ([place]) 04/02/2015  . Plantar fasciitis 04/02/2015  . CA of prostate ([place]) 04/02/2015  . Benign essential tremor 04/02/2015  . Lower urinary tract infection 04/02/2015  . Degeneration of intervertebral disc of lumbosacral region 05/10/2012    Past Medical History  Diagnosis Date  .  Hyperlipidemia   . Hypertension   . Inguinal hernia   . Arthritis     OA  . Allergy     allergic rhinitis  . Cancer [facility])     Prostate  . Parkinson's disease [facility])     Social History   Social History  . Marital Status: Married    Spouse Name: N/A  . Number of Children: N/A  . Years of Education: N/A   Occupational History  . Not on file.   Social History Main Topics  . Smoking status: Former Smoker    Types: Cigars  . Smokeless tobacco: Current  User    Types: Chew     Comment: Occassionally, also chewed tobacco till 2005. Quit smoking 1973  . Alcohol Use: No  . Drug Use: No  . Sexual Activity: Not on file   Other Topics Concern  . Not on file   Social History Narrative    Not on File  Review of Systems  Constitutional: Negative.   HENT: Positive for tinnitus.   Eyes: Negative.   Respiratory: Negative.   Cardiovascular: Negative.   Gastrointestinal: Negative.   Genitourinary: Negative.   Musculoskeletal: Negative.   Skin: Negative.   Neurological: Negative.   Endo/Heme/Allergies: Negative.   Psychiatric/Behavioral: Negative.     Immunization History  Administered Date(s) Administered  . Influenza, High Dose Seasonal PF 04/02/2015  . Pneumococcal Polysaccharide-23 04/06/2013, 04/05/2014  . Td 11/30/2006  . Zoster 04/06/2013   Objective:  BP 162/88 mmHg  Pulse 80  Temp(Src) 97.6 F (36.4 C) (Oral)  Resp 16  Wt 209 lb (94.802 kg)  Physical Exam  Constitutional: He is oriented to person, place, and time and well-developed, well-nourished, and in no distress.  HENT:  Head: Normocephalic and atraumatic.  Right Ear: External ear normal.  Left Ear: External ear normal.  Nose: Nose normal.  Mouth/Throat: Oropharynx is clear and moist.  Eyes: Conjunctivae and EOM are normal. Pupils are equal, round, and reactive to light.  Mild nystagmus to the left eye.   Neck: Normal range of motion. Neck supple.  Pulmonary/Chest: Effort normal and breath sounds normal.  Abdominal: Soft.  Musculoskeletal: Normal range of motion.  Neurological: He is alert and oriented to person, place, and time. He has normal reflexes. GCS score is 15.  Stigmata of Parkinson's disease. Pt walking with a cane.  Skin: Skin is warm and dry.  Psychiatric: Mood, memory, affect and judgment normal.    Lab Results  Component Value Date   WBC 6.2 06/19/2014   HGB 14.4 06/19/2014   HCT 43 06/19/2014   PLT 253 06/19/2014   GLUCOSE 121*  04/06/2014   CHOL 226* 10/04/2014   TRIG 98 10/04/2014   HDL 47 10/04/2014   TSH 1.90 06/19/2014   PSA <0.1 09/11/2010   INR 1.0 03/21/2014   HGBA1C 6.3* 10/04/2014    CMP     Component Value Date/Time   NA 136 04/06/2014 0434   K 4.5 06/19/2014   K 3.8 04/06/2014 0434   CL 103 04/06/2014 0434   CO2 26 04/06/2014 0434   GLUCOSE 121* 04/06/2014 0434   BUN 20 06/19/2014   BUN 13 04/06/2014 0434   CREATININE 1.0 06/19/2014   CREATININE 0.94 04/06/2014 0434   CALCIUM 8.1* 04/06/2014 0434   AST 19 06/19/2014   ALT 15 06/19/2014   ALKPHOS 73 06/19/2014   GFRNONAA >60 04/06/2014 0434   GFRNONAA 54* 04/04/2014 0527   GFRAA >60 04/06/2014 0434   GFRAA >60  04/04/2014 0527    Assessment and Plan :   1. Tinnitus, left  - hydrochlorothiazide (HYDRODIURIL) 12.5 MG tablet; Take 1 tablet (12.5 mg total) by mouth daily.  Dispense: 30 tablet; Refill: 12  2. Meniere disease, unspecified laterality Possibly. Follow up in 2 months.  - hydrochlorothiazide (HYDRODIURIL) 12.5 MG tablet; Take 1 tablet (12.5 mg total) by mouth daily.  Dispense: 30 tablet; Refill: 12  3. Hypertension HCTZ 12.5 mg every morning Return to clinic 1 month 4. Parkinson's disease 5. Mourning Patient's wife for 55 years diabetic month ago. 6. Prostate cancer Per Dr. Yves Dill.   ptwas seen and examined by Dr. Miguel Aschoff, and noted scribed by Webb Laws, [place] MD [facility] 05/22/2015 3:38 PM

## 2015-05-22 NOTE — Therapy (Signed)
[place] MAIN [facility] SERVICES [address] [place], [place], [address] Phone: [phone]   Fax:  [phone]  Occupational Therapy Treatment  Patient Details  Name: Roger Rodriguez MRN: [id number] Date of Birth: 1941-02-20 No Data Recorded  Encounter Date: 05/21/2015      OT End of Session - 05/21/15 2051    Visit Number 12   Number of Visits 17   Date for OT Re-Evaluation 06/04/15   Authorization Type Medicare G code 12   OT Start Time 1007   OT Stop Time 1101   OT Time Calculation (min) 54 min   Activity Tolerance Patient tolerated treatment well   Behavior During Therapy [facility] for tasks assessed/performed      Past Medical History  Diagnosis Date  . Hyperlipidemia   . Hypertension   . Inguinal hernia   . Arthritis     OA  . Allergy     allergic rhinitis  . Cancer [facility])     Prostate  . Parkinson's disease [facility])     Past Surgical History  Procedure Laterality Date  . Rotator cuff tear  1993    Repaired  . H/o arthroscopy of right knee    . H/o radical protatectomy    . Vasectomy      History    There were no vitals filed for this visit.  Visit Diagnosis:  Muscle weakness (generalized)  Lack of coordination  Self-care deficit for dressing and grooming  Unsteady gait      Subjective Assessment - 05/21/15 2050    Subjective  Patient reports he did exercises last week and is doing well, has been feeling lonely since his wife passed away, "It has been 4 weeks today."   Patient Stated Goals Patient reports he wants to walk better, have good balance and to be independent.    Currently in Pain? No/denies   Pain Score 0-No pain                      OT Treatments/Exercises (OP) - 05/21/15 2051    ADLs   ADL Comments Functional component tasks of sit to stand without the use of arms, hand to mouth patterns with spoon, reaching the back of the head on the left    Neurological Re-education Exercises    Other Exercises 1 LSVT Daily Session Maximal Daily Exercises: Sustained movements are designed to rescale the amplitude of movement output for generalization to daily functional activities. Performed as follows for 1 set of 10 repetitions each: Multi directional sustained movements- 1) Floor to ceiling, 2) Side to side. Multi directional Repetitive movements performed in standing and are designed to provide retraining effort needed for sustained muscle activation in tasks Performed as follows: 3) Step and reach forward, 4) Step and Reach Backwards, 5) Step and reach sideways, 6) Rock and reach forward/backward, 7) Rock and reach sideways. Sit to stand from mat table on lowest setting with cues for weight shift, technique and CGA for 5 reps for 2 sets. Patient seen for functional mobility tasks this date with emphasis on gait speed, length of steps with short distance ambulation. Patient requires cues for BIG movements and cadenceAll standing exercises performed this date with CGA to SBA, verbal and tactile cues as needed   Other Exercises 2 Patient seen for leg press 120# for 2 sets of 10 reps. Stairs with cues for reciprocal lower extremity movements going up and down and CGA to complete and use  of one handrail. Functional mobility for 2 trials of functional mobility utilizing BIG principles for 2 trials of 500 feet each this date but cues for reciprocal arm swing and amplitude of steps at times.                OT Education - 05/21/15 2051    Education provided Yes   Education Details LSVT BIG exercises, amplitude of gait, balance.   Person(s) Educated Patient   Methods Explanation;Demonstration;Verbal cues   Comprehension Verbal cues required;Returned demonstration;Verbalized understanding             OT Long Term Goals - 05/08/15 1605    OT LONG TERM GOAL #1   Title Patient will improve gait speed and endurance and be able to ambulate 950 feet in 6 minutes to negotiate around the  home and community safely.   Baseline 875 feet at 10th visit   Time 4   Period Weeks   Status On-going   OT LONG TERM GOAL #2   Title Patient will complete HEP for maximal daily exercises with modified independence    Time 4   Period Weeks   Status Partially Met   OT LONG TERM GOAL #3   Title Patient will transfer from sit to stand without the use of arms safely and independently from a variety of chairs/surfaces.   Baseline 5 times sit to stand at eval 48 secs, at 10th visit 32 secs   Time 4   Period Weeks   Status Partially Met   OT LONG TERM GOAL #4   Title Patient will demonstrate improved balance for functional daily tasks with a score on Berg Balance test greater than 50/56.   Time 4   Period Weeks   Status On-going   OT LONG TERM GOAL #5   Title Patient will demonstrate a decrease in hesistation and freezing behaviors with initiation and termination of gait and with turning behaviors as evidenced of a score lower than 10 on the Freezing of Gait Questionairre.   Baseline score of 14 on FOG at eval, score of 12 at 10th visit   Time 4   Period Weeks   Status On-going   OT LONG TERM GOAL #6   Title Patient will self feed with minimal spillage using utensils and complete hand to mouth patterns with 90% accuracy or greater.    Time 4   Period Weeks   Status Partially Met   OT LONG TERM GOAL #7   Title Patient will demonstrate improved external rotation of BUE to comb the back of his hair independently.   Time 4   Period Weeks   Status Partially Met   OT LONG TERM GOAL #8   Title Patient will negotiate 5 steps safely demonstrating a reciprocal LE patterns with good form.   Time 4   Period Weeks   Status On-going               Plan - 05/21/15 2052    Clinical Impression Statement Patient has continued to make progress in all areas, he is able to recall most exercises for HEP without the use of the handouts, minimal cues for arm/hand placement during select  exercises.  He still has some issues with right foot follow thru at times and requires verbal and tactile cues and is able to demonstrate improved amplitude of steps.  One rest break this date between trials of functional mobility.     Pt will benefit from skilled therapeutic intervention  in order to improve on the following deficits (Retired) Decreased coordination;Decreased range of motion;Difficulty walking;Decreased endurance;Decreased balance;Impaired UE functional use;Decreased mobility;Decreased strength   Rehab Potential Good   OT Frequency 4x / week   OT Duration 4 weeks   OT Treatment/Interventions Self-care/ADL training;Therapeutic exercise;Functional Mobility Training;Patient/family education;Neuromuscular education;Balance training;DME and/or AE instruction;Gait Training;Stair Training   Consulted and Agree with Plan of Care Patient        Problem List Patient Active Problem List   Diagnosis Date Noted  . Hepatitis A 04/02/2015  . Allergic rhinitis 04/02/2015  . Arthritis 04/02/2015  . Narrowing of intervertebral disc space 04/02/2015  . 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 Accumulation of fluid in tissues 04/02/2015  . Essential (primary) hypertension 04/02/2015  . Borderline diabetes 04/02/2015  . HLD (hyperlipidemia) 04/02/2015  . BP (high blood pressure) 04/02/2015  . Infected sebaceous cyst 04/02/2015  . Hernia, inguinal 04/02/2015  . Leg pain 04/02/2015  . Lumbar canal stenosis 04/02/2015  . 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 Malignant melanoma ([place]) 04/02/2015  . Arthritis, degenerative 04/02/2015  . Fatigue 04/02/2015  . Idiopathic Parkinson's disease ([place]) 04/02/2015  . Plantar fasciitis 04/02/2015  . CA of prostate ([place]) 04/02/2015  . Benign essential tremor 04/02/2015  . Lower urinary tract infection 04/02/2015   Garin Mata T Tomasita Morrow, OTR/L, CLT [place] 05/22/2015, 3:01 PM  [place] MAIN [facility]  SERVICES [address] [place], [place], [address] Phone: [phone]   Fax:  [phone]  Name: Roger Rodriguez MRN: [id number] Date of Birth: 1940/07/23

## 2015-05-24 ENCOUNTER — Ambulatory Visit: Payer: PPO | Admitting: Occupational Therapy

## 2015-06-24 ENCOUNTER — Ambulatory Visit: Payer: Self-pay | Admitting: Family Medicine

## 2015-07-03 ENCOUNTER — Encounter: Payer: Self-pay | Admitting: Family Medicine

## 2015-07-24 ENCOUNTER — Encounter: Payer: Self-pay | Admitting: Family Medicine

## 2015-07-24 ENCOUNTER — Ambulatory Visit (INDEPENDENT_AMBULATORY_CARE_PROVIDER_SITE_OTHER): Payer: PPO | Admitting: Family Medicine

## 2015-07-24 VITALS — BP 132/84 | HR 72 | Temp 98.0°F | Resp 16 | Wt 210.0 lb

## 2015-07-24 DIAGNOSIS — J0100 Acute maxillary sinusitis, unspecified: Secondary | ICD-10-CM

## 2015-07-24 DIAGNOSIS — R05 Cough: Secondary | ICD-10-CM

## 2015-07-24 DIAGNOSIS — R059 Cough, unspecified: Secondary | ICD-10-CM

## 2015-07-24 MED ORDER — HYDROCOD POLST-CPM POLST ER 10-8 MG/5ML PO SUER: 5.0000 mL | Freq: Two times a day (BID) | ORAL | Status: DC | PRN

## 2015-07-24 MED ORDER — AMOXICILLIN-POT CLAVULANATE 875-125 MG PO TABS: 1.0000 | ORAL_TABLET | Freq: Two times a day (BID) | ORAL | Status: DC

## 2015-07-24 NOTE — Progress Notes (Signed)
Patient ID: Roger Rodriguez, male   DOB: 27-Mar-1941, 75 y.o.   MRN: [id number]    Subjective:  HPI  Patient started to feel bad on Thursday January 5th. Symptoms include: cough-productive in the morning with yellow phlegm, blowing out greenish mucus, congestion in the chest and throat some, ears feel stopped up. He gets bothered by the cough mainly at night. HGe had a fever a little over a 100 on Sunday-December 8th.patient feels as though he has a low-grade fever, no myalgias.  Prior to Admission medications   Medication Sig Start Date End Date Taking? Authorizing Provider  aspirin 325 MG tablet Take by mouth.   Yes Historical Provider, MD  budesonide (RHINOCORT AQUA) 32 MCG/ACT nasal spray Place into the nose. 10/02/14  Yes Historical Provider, MD  carbidopa-levodopa (SINEMET CR) 50-200 MG per tablet TAKE 1 TABLET BY MOUTH 3 (THREE) TIMES DAILY. 03/21/15  Yes Historical Provider, MD  carbidopa-levodopa (SINEMET IR) 25-100 MG per tablet TAKE 2 TABLETS BY MOUTH 3 (THREE) TIMES DAILY. 01/29/15  Yes Historical Provider, MD  celecoxib (CELEBREX) 200 MG capsule TAKE 1 CAPSULE BY MOUTH TWICE DAILY 04/03/15  Yes [name]., MD  Cholecalciferol (VITAMIN D) 2000 UNITS tablet Take by mouth. 05/19/12  Yes Historical Provider, MD  furosemide (LASIX) 20 MG tablet Take 1 tablet by mouth daily. 02/05/14  Yes Historical Provider, MD  gabapentin (NEURONTIN) 400 MG capsule Take 400 mg by mouth 2 (two) times daily. 03/26/15  Yes Historical Provider, MD  hydrochlorothiazide (HYDRODIURIL) 12.5 MG tablet Take 1 tablet (12.5 mg total) by mouth daily. 05/22/15  Yes Richard Maceo Pro., MD  lisinopril (PRINIVIL,ZESTRIL) 5 MG tablet Take 5 mg by mouth 2 (two) times daily. 01/29/15  Yes Historical Provider, MD  loratadine (CLARITIN) 10 MG tablet Take 10 mg by mouth daily. 05/19/12  Yes Historical Provider, MD  mirabegron ER (MYRBETRIQ) 50 MG TB24 tablet Take by mouth.   Yes Historical Provider, MD  MYRBETRIQ 25 MG  TB24 tablet TAKE 1 TO 2 TABLETS DAILY 01/09/15  Yes Historical Provider, MD  primidone (MYSOLINE) 50 MG tablet Take 50 tablets by mouth 2 (two) times daily.   Yes Historical Provider, MD  sodium chloride (OCEAN) 0.65 % nasal spray Place into the nose.   Yes Historical Provider, MD  VITAMIN E NATURAL PO Take by mouth. 05/19/12  Yes Historical Provider, MD    Patient Active Problem List   Diagnosis Date Noted  . Malignant neoplasm of prostate ([place]) 05/22/2015  . Hepatitis A 04/02/2015  . Allergic rhinitis 04/02/2015  . Arthritis 04/02/2015  . Narrowing of intervertebral disc space 04/02/2015  . 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 Accumulation of fluid in tissues 04/02/2015  . Essential (primary) hypertension 04/02/2015  . Borderline diabetes 04/02/2015  . HLD (hyperlipidemia) 04/02/2015  . BP (high blood pressure) 04/02/2015  . Infected sebaceous cyst 04/02/2015  . Hernia, inguinal 04/02/2015  . Leg pain 04/02/2015  . Lumbar canal stenosis 04/02/2015  . 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 Malignant melanoma ([place]) 04/02/2015  . Arthritis, degenerative 04/02/2015  . Fatigue 04/02/2015  . Idiopathic Parkinson's disease ([place]) 04/02/2015  . Plantar fasciitis 04/02/2015  . CA of prostate ([place]) 04/02/2015  . Benign essential tremor 04/02/2015  . Lower urinary tract infection 04/02/2015  . Degeneration of intervertebral disc of lumbosacral region 05/10/2012    Past Medical History  Diagnosis Date  . Hyperlipidemia   . Hypertension   . Inguinal hernia   . Arthritis  OA  . Allergy     allergic rhinitis  . Cancer [facility])     Prostate  . Parkinson's disease [facility])     Social History   Social History  . Marital Status: Married    Spouse Name: N/A  . Number of Children: N/A  . Years of Education: N/A   Occupational History  . Not on file.   Social History Main Topics  . Smoking status: Former Smoker    Types: Cigars  . Smokeless tobacco: Current User     Types: Chew     Comment: Occassionally, also chewed tobacco till 2005. Quit smoking 1973  . Alcohol Use: No  . Drug Use: No  . Sexual Activity: No   Other Topics Concern  . Not on file   Social History Narrative    No Known Allergies  Review of Systems  Constitutional: Positive for fever and malaise/fatigue.  HENT: Positive for congestion.   Respiratory: Positive for cough.   Cardiovascular: Negative.   Gastrointestinal: Negative.   Musculoskeletal: Positive for myalgias and joint pain.  Neurological: Positive for weakness.  Endo/Heme/Allergies: Negative.   Psychiatric/Behavioral: Negative.     Immunization History  Administered Date(s) Administered  . Influenza, High Dose Seasonal PF 04/02/2015  . Pneumococcal Polysaccharide-23 04/06/2013, 04/05/2014  . Td 11/30/2006  . Zoster 04/06/2013   Objective:  BP 132/84 mmHg  Pulse 72  Temp(Src) 98 F (36.7 C)  Resp 16  Wt 210 lb (95.255 kg)  SpO2 98%  Physical Exam  Constitutional: He is oriented to person, place, and time and well-developed, well-nourished, and in no distress.  HENT:  Head: Normocephalic and atraumatic.  Nose: Right sinus exhibits maxillary sinus tenderness. Left sinus exhibits maxillary sinus tenderness.  Mouth/Throat: Oropharynx is clear and moist.  Eyes: Conjunctivae are normal.  Neck: Neck supple.  Cardiovascular: Normal rate, regular rhythm and normal heart sounds.   Pulmonary/Chest: Effort normal and breath sounds normal.  Abdominal: Soft.  Neurological: He is alert and oriented to person, place, and time.  Parkinson's stigmata  Skin: Skin is warm and dry.  Psychiatric: Mood, memory, affect and judgment normal.    Lab Results  Component Value Date   WBC 6.2 06/19/2014   HGB 14.4 06/19/2014   HCT 43 06/19/2014   PLT 253 06/19/2014   GLUCOSE 121* 04/06/2014   CHOL 226* 10/04/2014   TRIG 98 10/04/2014   HDL 47 10/04/2014   TSH 1.90 06/19/2014   PSA <0.1 09/11/2010   INR 1.0  03/21/2014   HGBA1C 6.3* 10/04/2014    CMP     Component Value Date/Time   NA 136 04/06/2014 0434   K 4.5 06/19/2014   K 3.8 04/06/2014 0434   CL 103 04/06/2014 0434   CO2 26 04/06/2014 0434   GLUCOSE 121* 04/06/2014 0434   BUN 20 06/19/2014   BUN 13 04/06/2014 0434   CREATININE 1.0 06/19/2014   CREATININE 0.94 04/06/2014 0434   CALCIUM 8.1* 04/06/2014 0434   AST 19 06/19/2014   ALT 15 06/19/2014   ALKPHOS 73 06/19/2014   GFRNONAA >60 04/06/2014 0434   GFRNONAA 54* 04/04/2014 0527   GFRAA >60 04/06/2014 0434   GFRAA >60 04/04/2014 0527    Assessment and Plan :  1. Cough - chlorpheniramine-HYDROcodone (TUSSIONEX PENNKINETIC ER) 10-8 MG/5ML SUER; Take 5 mLs by mouth every 12 (twelve) hours as needed for cough.  Dispense: 140 mL; Refill: 0  2. Acute maxillary sinusitis, recurrence not specified - amoxicillin-clavulanate (AUGMENTIN) 875-125 MG tablet; Take  1 tablet by mouth 2 (two) times daily.  Dispense: 20 tablet; Refill: [place] MD [facility] 07/24/2015 2:41 PM

## 2015-08-05 DIAGNOSIS — L820 Inflamed seborrheic keratosis: Secondary | ICD-10-CM | POA: Diagnosis not present

## 2015-08-05 DIAGNOSIS — L570 Actinic keratosis: Secondary | ICD-10-CM | POA: Diagnosis not present

## 2015-08-05 DIAGNOSIS — L578 Other skin changes due to chronic exposure to nonionizing radiation: Secondary | ICD-10-CM | POA: Diagnosis not present

## 2015-08-05 DIAGNOSIS — L821 Other seborrheic keratosis: Secondary | ICD-10-CM | POA: Diagnosis not present

## 2015-08-05 DIAGNOSIS — Z85828 Personal history of other malignant neoplasm of skin: Secondary | ICD-10-CM | POA: Diagnosis not present

## 2015-09-06 DIAGNOSIS — G20 Parkinson's disease: Secondary | ICD-10-CM | POA: Diagnosis not present

## 2015-09-06 DIAGNOSIS — G2581 Restless legs syndrome: Secondary | ICD-10-CM | POA: Diagnosis not present

## 2015-09-16 ENCOUNTER — Other Ambulatory Visit: Payer: Self-pay | Admitting: Family Medicine

## 2015-09-18 DIAGNOSIS — G20 Parkinson's disease: Secondary | ICD-10-CM | POA: Insufficient documentation

## 2015-09-18 DIAGNOSIS — G2581 Restless legs syndrome: Secondary | ICD-10-CM | POA: Insufficient documentation

## 2015-10-02 ENCOUNTER — Ambulatory Visit (INDEPENDENT_AMBULATORY_CARE_PROVIDER_SITE_OTHER): Payer: PPO | Admitting: Family Medicine

## 2015-10-02 ENCOUNTER — Encounter: Payer: Self-pay | Admitting: Family Medicine

## 2015-10-02 ENCOUNTER — Telehealth: Payer: Self-pay | Admitting: Family Medicine

## 2015-10-02 VITALS — BP 144/70 | HR 68 | Temp 97.7°F | Resp 16 | Ht 70.0 in | Wt 214.0 lb

## 2015-10-02 DIAGNOSIS — E785 Hyperlipidemia, unspecified: Secondary | ICD-10-CM

## 2015-10-02 DIAGNOSIS — G20 Parkinson's disease: Secondary | ICD-10-CM | POA: Diagnosis not present

## 2015-10-02 DIAGNOSIS — Z0000 Encounter for general adult medical examination without abnormal findings: Secondary | ICD-10-CM | POA: Diagnosis not present

## 2015-10-02 DIAGNOSIS — C61 Malignant neoplasm of prostate: Secondary | ICD-10-CM

## 2015-10-02 DIAGNOSIS — R7303 Prediabetes: Secondary | ICD-10-CM | POA: Diagnosis not present

## 2015-10-02 NOTE — Telephone Encounter (Signed)
Patient reports that he takes Gabapentin for his restless legs but it doesn't last long. Patient wanted to know if he should increase gabapentin, or should he try a different medication? Please advise. Thanks!

## 2015-10-02 NOTE — Telephone Encounter (Signed)
Pt was just in for a physical today.  He wanted to talk about his restless leg syndrome but left and forgot to address it.  Can someone please call him back  His call back is  [phone]  Thanks Con Memos

## 2015-10-02 NOTE — Telephone Encounter (Signed)
I would say as his neurologist when he sees them, some of the medications for RLS can make Parkinson's worse.

## 2015-10-02 NOTE — Telephone Encounter (Signed)
Advised patient as below.  

## 2015-10-02 NOTE — Progress Notes (Signed)
Patient ID: Roger Rodriguez, male   DOB: [birth year], 75 y.o.   MRN: [id number]       Patient: Roger Rodriguez, Male    DOB: 05-26-1941, 75 y.o.   MRN: [id number] Visit Date: 10/02/2015  Today's Provider: Wilhemena Durie, MD   Chief Complaint  Patient presents with  . Annual Exam   Subjective:    Annual wellness visit Roger Rodriguez is a 75 y.o. male. He feels well. He reports exercising none. He reports he is sleeping well. He sleeps on average 6 hours a night.  Patient has prostate cancer followed by urology. He would like to see a new urologist. He has Parkinson's disease which is affecting his quality of life most of all. He is still mourning the death of his wife from last year. He feels sad but is not suicidal. Colonoscopy-  Pneumonvax-04/05/2014 Td- 11/30/2006 Zoster- 04/06/2013 Prevnar-     Review of Systems  Constitutional: Positive for activity change.  HENT: Negative.   Eyes: Negative.   Respiratory: Negative.   Cardiovascular: Positive for leg swelling.  Gastrointestinal: Negative.   Endocrine: Negative.   Genitourinary: Positive for difficulty urinating.  Musculoskeletal: Positive for back pain.  Skin: Negative.   Allergic/Immunologic: Negative.   Neurological: Positive for tremors.       Patient has parkinson's disease.  Hematological: Negative.   Psychiatric/Behavioral: Negative.     Social History   Social History  . Marital Status: Married    Spouse Name: N/A  . Number of Children: N/A  . Years of Education: N/A   Occupational History  . Not on file.   Social History Main Topics  . Smoking status: Former Smoker    Types: Cigars  . Smokeless tobacco: Current User    Types: Chew     Comment: Occassionally, also chewed tobacco till 2005. Quit smoking 1973  . Alcohol Use: No  . Drug Use: No  . Sexual Activity: No   Other Topics Concern  . Not on file   Social History Narrative    Past Medical History  Diagnosis Date  .  Hyperlipidemia   . Hypertension   . Inguinal hernia   . Arthritis     OA  . Allergy     allergic rhinitis  . Cancer [facility])     Prostate  . Parkinson's disease [facility])      Patient Active Problem List   Diagnosis Date Noted  . Malignant neoplasm of prostate ([place]) 05/22/2015  . Hepatitis A 04/02/2015  . Allergic rhinitis 04/02/2015  . Arthritis 04/02/2015  . Narrowing of intervertebral disc space 04/02/2015  . 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 Accumulation of fluid in tissues 04/02/2015  . Essential (primary) hypertension 04/02/2015  . Borderline diabetes 04/02/2015  . HLD (hyperlipidemia) 04/02/2015  . BP (high blood pressure) 04/02/2015  . Infected sebaceous cyst 04/02/2015  . Hernia, inguinal 04/02/2015  . Leg pain 04/02/2015  . Lumbar canal stenosis 04/02/2015  . 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 Malignant melanoma ([place]) 04/02/2015  . Arthritis, degenerative 04/02/2015  . Fatigue 04/02/2015  . Idiopathic Parkinson's disease ([place]) 04/02/2015  . Plantar fasciitis 04/02/2015  . CA of prostate ([place]) 04/02/2015  . Benign essential tremor 04/02/2015  . Lower urinary tract infection 04/02/2015  . Degeneration of intervertebral disc of lumbosacral region 05/10/2012    Past Surgical History  Procedure Laterality Date  . Rotator cuff tear  1993    Repaired  . H/o arthroscopy of right knee    .  H/o radical protatectomy    . Vasectomy      History    His family history includes Cerebral aneurysm in his father; Dementia in his mother.    Previous Medications   AMOXICILLIN-CLAVULANATE (AUGMENTIN) 875-125 MG TABLET    Take 1 tablet by mouth 2 (two) times daily.   ASPIRIN 325 MG TABLET    Take by mouth. Reported on 10/02/2015   BUDESONIDE (RHINOCORT AQUA) 32 MCG/ACT NASAL SPRAY    Place into the nose.   CARBIDOPA-LEVODOPA (SINEMET CR) 50-200 MG PER TABLET    TAKE 1 TABLET BY MOUTH 3 (THREE) TIMES DAILY.   CARBIDOPA-LEVODOPA (SINEMET IR) 25-100 MG PER  TABLET    TAKE 2 TABLETS BY MOUTH 3 (THREE) TIMES DAILY.   CELECOXIB (CELEBREX) 200 MG CAPSULE    TAKE 1 CAPSULE BY MOUTH TWICE DAILY   CHLORPHENIRAMINE-HYDROCODONE (TUSSIONEX PENNKINETIC ER) 10-8 MG/5ML SUER    Take 5 mLs by mouth every 12 (twelve) hours as needed for cough.   CHOLECALCIFEROL (VITAMIN D) 2000 UNITS TABLET    Take by mouth.   FUROSEMIDE (LASIX) 20 MG TABLET    Take 1 tablet by mouth daily.   GABAPENTIN (NEURONTIN) 400 MG CAPSULE    Take 400 mg by mouth 2 (two) times daily.   HYDROCHLOROTHIAZIDE (HYDRODIURIL) 12.5 MG TABLET    Take 1 tablet (12.5 mg total) by mouth daily.   LISINOPRIL (PRINIVIL,ZESTRIL) 5 MG TABLET    Take 5 mg by mouth 2 (two) times daily. Reported on 10/02/2015   LORATADINE (CLARITIN) 10 MG TABLET    Take 10 mg by mouth daily.   MIRABEGRON ER (MYRBETRIQ) 50 MG TB24 TABLET    Take by mouth. Reported on 10/02/2015   MYRBETRIQ 25 MG TB24 TABLET    TAKE 1 TO 2 TABLETS DAILY   PRIMIDONE (MYSOLINE) 50 MG TABLET    Take 50 tablets by mouth 2 (two) times daily. Reported on 10/02/2015   SODIUM CHLORIDE (OCEAN) 0.65 % NASAL SPRAY    Place into the nose.   VITAMIN E NATURAL PO    Take by mouth.    Patient Care Team: [name]., MD as PCP - General (Family Medicine)     Objective:   Vitals: BP 144/70 mmHg  Pulse 68  Temp(Src) 97.7 F (36.5 C)  Resp 16  Ht 5\' 10"  (1.778 m)  Wt 214 lb (97.07 kg)  BMI 30.71 kg/m2  Physical Exam  Constitutional: He is oriented to person, place, and time. He appears well-developed and well-nourished.  HENT:  Head: Normocephalic and atraumatic.  Right Ear: External ear normal.  Left Ear: External ear normal.  Nose: Nose normal.  Mouth/Throat: Oropharynx is clear and moist.  Eyes: Conjunctivae are normal.  Neck: Neck supple. No thyromegaly present.  Cardiovascular: Normal rate, regular rhythm, normal heart sounds and intact distal pulses.   Pulmonary/Chest: Effort normal and breath sounds normal.  Abdominal: Soft.    Musculoskeletal: He exhibits edema.  1+ lower extremity edema  Lymphadenopathy:    He has no cervical adenopathy.  Neurological: He is alert and oriented to person, place, and time. No cranial nerve deficit.  Parkinson's stigmata on exam.   Skin: Skin is warm and dry.  Psychiatric: He has a normal mood and affect. His behavior is normal. Judgment and thought content normal.    Activities of Daily Living In your present state of health, do you have any difficulty performing the following activities: 10/02/2015 04/02/2015  Hearing? N N  Vision?  N N  Difficulty concentrating or making decisions? N N  Walking or climbing stairs? Y Y  Dressing or bathing? N N  Doing errands, shopping? N N    Fall Risk Assessment Fall Risk  10/02/2015 04/02/2015  Falls in the past year? Yes No  Number falls in past yr: 2 or more -  Injury with Fall? No -  Risk for fall due to : Impaired balance/gait -     Depression Screen PHQ 2/9 Scores 10/02/2015 10/02/2015 04/02/2015  PHQ - 2 Score 2 2 0  PHQ- 9 Score 6 - -    Cognitive Testing - 6-CIT  Correct? Score   What year is it? yes 0 0 or 4  What month is it? yes 0 0 or 3  Memorize:    Pia Mau,  41,  [address]  [place],      What time is it? (within 1 hour) yes 0 0 or 3  Count backwards from 20 yes 0 0, 2, or 4  Name the months of the year yes 0 0, 2, or 4  Repeat name & address above yes 0 0, 2, 4, 6, 8, or 10       TOTAL SCORE  0/28   Interpretation:  Normal  Normal (0-7) Abnormal (8-28)       Assessment & Plan:     Annual Wellness Visit  Reviewed patient's Family Medical History Reviewed and updated list of patient's medical providers Assessment of cognitive impairment was done Assessed patient's functional ability Established a written schedule for health screening [place] Completed and Reviewed  Exercise Activities and Dietary recommendations Goals    None      Immunization History  Administered  Date(s) Administered  . Influenza, High Dose Seasonal PF 04/02/2015  . Pneumococcal Polysaccharide-23 04/06/2013, 04/05/2014  . Td 11/30/2006  . Zoster 04/06/2013    Health Maintenance  Topic Date Due  . COLONOSCOPY  02/09/1990  . PNA vac Low Risk Adult (2 of 2 - PCV13) 04/06/2015  . INFLUENZA VACCINE  02/11/2016  . TETANUS/TDAP  11/29/2016  . ZOSTAVAX  Completed      Discussed health benefits of physical activity, and encouraged him to engage in regular exercise appropriate for his age and condition.   prostate cancer and kidney stones Refer to urology Parkinson's disease Per neurology. Hypertension Hyperlipidemia I have done the exam and reviewed the above chart and it is accurate to the best of my knowledge.     ------------------------------------------------------------------------------------------------------------

## 2015-10-03 LAB — HEMOGLOBIN A1C
Est. average glucose Bld gHb Est-mCnc: 128 mg/dL
Hgb A1c MFr Bld: 6.1 % — ABNORMAL HIGH (ref 4.8–5.6)

## 2015-10-03 LAB — LIPID PANEL
Chol/HDL Ratio: 5 ratio units (ref 0.0–5.0)
Cholesterol, Total: 190 mg/dL (ref 100–199)
HDL: 38 mg/dL — ABNORMAL LOW (ref >39)
LDL Calculated: 129 mg/dL — ABNORMAL HIGH (ref 0–99)
Triglycerides: 116 mg/dL (ref 0–149)
VLDL Cholesterol Cal: 23 mg/dL (ref 5–40)

## 2015-10-03 LAB — PSA: Prostate Specific Ag, Serum: <0.1 ng/mL (ref 0.0–4.0)

## 2015-10-08 ENCOUNTER — Telehealth: Payer: Self-pay | Admitting: Family Medicine

## 2015-10-08 DIAGNOSIS — C61 Malignant neoplasm of prostate: Secondary | ICD-10-CM

## 2015-10-08 NOTE — Telephone Encounter (Signed)
Pt stated that when he was here for his CPE on 10/02/15 he discussed getting a referral to Uro. I didn't see a referral. Please advise. Thanks TNP

## 2015-10-08 NOTE — Telephone Encounter (Signed)
Order put in-aa 

## 2015-10-08 NOTE — Telephone Encounter (Signed)
Do you recall what the referral is for?-aa

## 2015-10-08 NOTE — Telephone Encounter (Signed)
Referral for prostate cancer

## 2015-10-09 DIAGNOSIS — Z85828 Personal history of other malignant neoplasm of skin: Secondary | ICD-10-CM | POA: Diagnosis not present

## 2015-10-09 DIAGNOSIS — L820 Inflamed seborrheic keratosis: Secondary | ICD-10-CM | POA: Diagnosis not present

## 2015-10-09 DIAGNOSIS — L720 Epidermal cyst: Secondary | ICD-10-CM | POA: Diagnosis not present

## 2015-10-09 DIAGNOSIS — L570 Actinic keratosis: Secondary | ICD-10-CM | POA: Diagnosis not present

## 2015-10-09 DIAGNOSIS — Z1283 Encounter for screening for malignant neoplasm of skin: Secondary | ICD-10-CM | POA: Diagnosis not present

## 2015-10-09 DIAGNOSIS — L309 Dermatitis, unspecified: Secondary | ICD-10-CM | POA: Diagnosis not present

## 2015-10-09 DIAGNOSIS — L578 Other skin changes due to chronic exposure to nonionizing radiation: Secondary | ICD-10-CM | POA: Diagnosis not present

## 2015-10-10 ENCOUNTER — Encounter: Payer: Self-pay | Admitting: Urology

## 2015-10-10 ENCOUNTER — Ambulatory Visit (INDEPENDENT_AMBULATORY_CARE_PROVIDER_SITE_OTHER): Payer: PPO | Admitting: Urology

## 2015-10-10 VITALS — BP 156/83 | HR 85 | Ht 71.0 in | Wt 210.0 lb

## 2015-10-10 DIAGNOSIS — R3129 Other microscopic hematuria: Secondary | ICD-10-CM | POA: Diagnosis not present

## 2015-10-10 DIAGNOSIS — Z87442 Personal history of urinary calculi: Secondary | ICD-10-CM

## 2015-10-10 DIAGNOSIS — C61 Malignant neoplasm of prostate: Secondary | ICD-10-CM | POA: Diagnosis not present

## 2015-10-10 DIAGNOSIS — N3281 Overactive bladder: Secondary | ICD-10-CM | POA: Diagnosis not present

## 2015-10-10 DIAGNOSIS — N393 Stress incontinence (female) (male): Secondary | ICD-10-CM | POA: Diagnosis not present

## 2015-10-10 LAB — URINALYSIS, COMPLETE
Bilirubin, UA: NEGATIVE
Glucose, UA: NEGATIVE
Leukocytes, UA: NEGATIVE
Nitrite, UA: NEGATIVE
Specific Gravity, UA: >1.03 — ABNORMAL HIGH (ref 1.005–1.030)
Urobilinogen, Ur: 1 mg/dL (ref 0.2–1.0)
pH, UA: 5.5 (ref 5.0–7.5)

## 2015-10-10 LAB — MICROSCOPIC EXAMINATION: RBC, UA: >30 /hpf — ABNORMAL HIGH (ref 0–?)

## 2015-10-10 NOTE — Progress Notes (Signed)
10/10/2015 11:29 AM   Roger Rodriguez [birth year] [id number]  Referring provider: [name]., MD [address] [place], [place] [address]  Chief Complaint  Patient presents with  . Prostate Cancer    New Patient    HPI: 75 year old male who presents today to establish care. He was referred by his primary care Dr. Rosanna Randy and is previously followed by Dr. Yves Dill.  Prostate cancer/ SUI Patient has a personal history of prostate cancer and underwent open radical prostatectomy in March 2006 by Dr. Quillian Quince.  Pathology unknown.  NED.   Most recent PSA <0.1 on 10/02/15.   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    Bladder overactivity /urinary frequency Patient has significant urinary symptoms today including urinary frequency and SUI.  He does have trouble starting his stream and has to sit to void.   He is on Mybetriq 25-50 mg which helps.   He does have a history of Parkinson disease.  Hematuria Patient does have significant hematuria today with 3+ blood and greater than 30 red blood cells per high-powered field.  Remote smoking history.  No gross hematuria.  No UTIs.   He did pull back his foreskin and thinks he provided a clean sample today.  History of kidney stones He does have a personal history of kidney stones.  He has had several ESWL, last ~3 year go.  He does have chronic back pain so he is not sure if his discomfort is from his back or from stones.  He is not passed a stone recently.  Last KUB 2014 with bilateral nephrolithiasis per report.   PMH: Past Medical History  Diagnosis Date  . Hyperlipidemia   . Hypertension   . Inguinal hernia   . Arthritis     OA  . Allergy     allergic rhinitis  . Prostate cancer ([place])   . Parkinson's disease ([place])   . History of kidney stones     Surgical History: Past Surgical History  Procedure Laterality Date  . Rotator cuff tear  1993    Repaired  . H/o arthroscopy of  right knee  2015  . H/o radical protatectomy    . Vasectomy      History  . Back surgery  2014  . Ureteroscopy with holmium laser lithotripsy  2002?  [name] Lithotripsy  2010    Home Medications:    Medication List       This list is accurate as of: 10/10/15 11:29 AM.  Always use your most recent med list.               carbidopa-levodopa 50-200 MG tablet  Commonly known as:  SINEMET CR  TAKE 1 TABLET BY MOUTH 3 (THREE) TIMES DAILY.     celecoxib 200 MG capsule  Commonly known as:  CELEBREX  TAKE 1 CAPSULE BY MOUTH TWICE DAILY     gabapentin 400 MG capsule  Commonly known as:  NEURONTIN  Take 400 mg by mouth 2 (two) times daily.     hydrochlorothiazide 12.5 MG tablet  Commonly known as:  HYDRODIURIL  Take 1 tablet (12.5 mg total) by mouth daily.     loratadine 10 MG tablet  Commonly known as:  CLARITIN  Take 10 mg by mouth daily.     MYRBETRIQ 50 MG Tb24 tablet  Generic drug:  mirabegron ER  Take by mouth. Reported on 10/02/2015     MYRBETRIQ 25 MG Tb24  tablet  Generic drug:  mirabegron ER  TAKE 1 TO 2 TABLETS DAILY     RHINOCORT AQUA 32 MCG/ACT nasal spray  Generic drug:  budesonide  Place into the nose.     triamcinolone cream 0.1 %  Commonly known as:  KENALOG     Vitamin D 2000 units tablet  Take by mouth.     VITAMIN E NATURAL PO  Take by mouth.        Allergies: No Known Allergies  Family History: Family History  Problem Relation Age of Onset  . Dementia Mother   . Cerebral aneurysm Father   . Prostate cancer Neg Hx   . Bladder Cancer Paternal Uncle   . Kidney cancer Neg Hx   . Liver cancer Sister     Social History:  reports that he has quit smoking. His smoking use included Cigars. His smokeless tobacco use includes Chew. He reports that he drinks alcohol. He reports that he does not use illicit drugs.  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 His wife passed away this fall of Parkinson's  disease/dementia.  ROS: UROLOGY Frequent Urination?: No Hard to postpone urination?: Yes Burning/pain with urination?: No Get up at night to urinate?: Yes Leakage of urine?: Yes Urine stream starts and stops?: No Trouble starting stream?: No Do you have to strain to urinate?: Yes Blood in urine?: No Urinary tract infection?: No Sexually transmitted disease?: No Injury to kidneys or bladder?: No Painful intercourse?: No Weak stream?: Yes Erection problems?: Yes Penile pain?: No  Gastrointestinal Nausea?: No Vomiting?: No Indigestion/heartburn?: No Diarrhea?: No Constipation?: No  Constitutional Fever: No Night sweats?: No Weight loss?: No Fatigue?: No  Skin Skin rash/lesions?: No Itching?: No  Eyes Blurred vision?: No Double vision?: No  Ears/Nose/Throat Sore throat?: No Sinus problems?: No  Hematologic/Lymphatic Swollen glands?: No Easy bruising?: No  Cardiovascular Leg swelling?: Yes Chest pain?: No  Respiratory Cough?: No Shortness of breath?: No  Endocrine Excessive thirst?: No  Musculoskeletal Back pain?: Yes Joint pain?: No  Neurological Headaches?: No Dizziness?: No  Psychologic Depression?: No Anxiety?: No  Physical Exam: BP 156/83 mmHg  Pulse 85  Ht 5\' 11"  (1.803 m)  Wt 210 lb (95.255 kg)  BMI 29.30 kg/m2  Constitutional:  Alert and oriented, No acute distress. HEENT: [place] AT, moist mucus membranes.  Trachea midline, no masses. Cardiovascular: No clubbing, cyanosis, or edema. Respiratory: Normal respiratory effort, no increased work of breathing. GI: Abdomen is soft, nontender, nondistended, no abdominal masses GU: No CVA tenderness.  Skin: No rashes, bruises or suspicious lesions. Neurologic: Grossly intact, no focal deficits, moving all 4 extremities.  Mild resting tremor noted. Ambulates with cane. Psychiatric: Normal mood and affect.  Laboratory Data: Lab Results  Component Value Date   WBC 6.2 06/19/2014   HGB  14.4 06/19/2014   HCT 43 06/19/2014   MCV 95 04/03/2014   PLT 253 06/19/2014    Lab Results  Component Value Date   CREATININE 1.0 06/19/2014    Lab Results  Component Value Date   PSA <0.1 09/11/2010     Lab Results  Component Value Date   HGBA1C 6.1* 10/02/2015    Urinalysis UA today with tip showing trace ketones, 3+ blood, 1+ protein. Microscopic exam does show greater than 30 red blood cells per high-powered field. There is also 6-10 white blood cells and moderate bacteria.   Pertinent Imaging: No recent imaging  Assessment & Plan:    1. Malignant neoplasm of  prostate [facility]) S/p prostatectomy 2006, NED.  PSA undetectable.  Recommend annual PSA. - Urinalysis, Complete  2. OAB (overactive bladder) Continue  Mybetriq 25-50 mg. Patient does not need refills of this time, the device to call when he does.  Discussed that his urinary symptoms are likely related to his underlying Parkinson's disease.  3. Urinary incontinence, stress, male Wears depends.  4. Microscopic hematuria Significant microscopic hematuria today. I have asked him to come back next week and pruritus with a repeat sample. If this does have persistent red blood cells on microscopy, I have recommended a microscopic hematuria workup. We discussed that this involves a CT urogram as well as in office cystoscopy. He understands and will return next week for UA. We will call him if he would like to pursue further workup.  5. History of kidney stones History of bilateral nephrolithiasis, no recent flank pain. We'll likely evaluate at the time of hematuria workup as above.   Return for UA next week (lab visit only) .  Hollice Espy, MD  [place] [address], [place], [place] [address] [phone]  I spent 45 min with this patient of which greater than 50% was spent in counseling and coordination of care with the patient.

## 2015-10-15 ENCOUNTER — Other Ambulatory Visit: Payer: PPO

## 2015-10-15 DIAGNOSIS — R3129 Other microscopic hematuria: Secondary | ICD-10-CM | POA: Diagnosis not present

## 2015-10-15 DIAGNOSIS — R310 Gross hematuria: Secondary | ICD-10-CM

## 2015-10-16 ENCOUNTER — Telehealth: Payer: Self-pay | Admitting: Urology

## 2015-10-16 DIAGNOSIS — R3129 Other microscopic hematuria: Secondary | ICD-10-CM

## 2015-10-16 LAB — URINALYSIS, COMPLETE
Bilirubin, UA: NEGATIVE
Glucose, UA: NEGATIVE
Leukocytes, UA: NEGATIVE
Nitrite, UA: NEGATIVE
Specific Gravity, UA: 1.025 (ref 1.005–1.030)
Urobilinogen, Ur: 0.2 mg/dL (ref 0.2–1.0)
pH, UA: 5.5 (ref 5.0–7.5)

## 2015-10-16 LAB — MICROSCOPIC EXAMINATION: Bacteria, UA: NONE SEEN

## 2015-10-16 NOTE — Telephone Encounter (Signed)
Done  Patient has been notified of the appt and made aware that he will be contacted to schd his CT  michelle

## 2015-10-16 NOTE — Telephone Encounter (Signed)
Patient notified, please schedule cysto and ct urogram, thanks

## 2015-10-16 NOTE — Telephone Encounter (Signed)
Persistent microscopic hematuria on UA today.  Also CaOx crystals present so may have some recurrent stones.  I'd like him to pursue hematuria work up with CT urogram and cysto to r/o other pathology.  We will be able to assess his stones with this work up as well.    Please let him know then forward message to office staff to schedule.    Hollice Espy, MD

## 2015-10-30 ENCOUNTER — Encounter: Payer: Self-pay | Admitting: Family Medicine

## 2015-10-30 ENCOUNTER — Ambulatory Visit
Admission: RE | Admit: 2015-10-30 | Discharge: 2015-10-30 | Disposition: A | Discharge status: Home / Self Care | Payer: PPO | Source: Ambulatory Visit | Attending: Urology | Admitting: Urology

## 2015-10-30 DIAGNOSIS — N281 Cyst of kidney, acquired: Secondary | ICD-10-CM | POA: Insufficient documentation

## 2015-10-30 DIAGNOSIS — I700 Atherosclerosis of aorta: Secondary | ICD-10-CM | POA: Diagnosis not present

## 2015-10-30 DIAGNOSIS — K760 Fatty (change of) liver, not elsewhere classified: Secondary | ICD-10-CM | POA: Diagnosis not present

## 2015-10-30 DIAGNOSIS — R3129 Other microscopic hematuria: Secondary | ICD-10-CM | POA: Insufficient documentation

## 2015-10-30 DIAGNOSIS — N202 Calculus of kidney with calculus of ureter: Secondary | ICD-10-CM | POA: Insufficient documentation

## 2015-10-30 DIAGNOSIS — C61 Malignant neoplasm of prostate: Secondary | ICD-10-CM | POA: Diagnosis not present

## 2015-10-30 DIAGNOSIS — N1339 Other hydronephrosis: Secondary | ICD-10-CM | POA: Insufficient documentation

## 2015-10-30 LAB — POCT I-STAT CREATININE: Creatinine, Ser: 1 mg/dL (ref 0.61–1.24)

## 2015-10-30 MED ORDER — IOPAMIDOL (ISOVUE-300) INJECTION 61%
125.0000 mL | Freq: Once | INTRAVENOUS | Status: AC | PRN
  Administered 2015-10-30: 125 mL via INTRAVENOUS

## 2015-11-08 ENCOUNTER — Ambulatory Visit (INDEPENDENT_AMBULATORY_CARE_PROVIDER_SITE_OTHER): Payer: PPO | Admitting: Urology

## 2015-11-08 ENCOUNTER — Encounter: Payer: Self-pay | Admitting: Urology

## 2015-11-08 VITALS — BP 177/96 | HR 79 | Ht 71.0 in | Wt 209.9 lb

## 2015-11-08 DIAGNOSIS — R3129 Other microscopic hematuria: Secondary | ICD-10-CM

## 2015-11-08 DIAGNOSIS — N200 Calculus of kidney: Secondary | ICD-10-CM | POA: Diagnosis not present

## 2015-11-08 DIAGNOSIS — C61 Malignant neoplasm of prostate: Secondary | ICD-10-CM | POA: Diagnosis not present

## 2015-11-08 DIAGNOSIS — N3281 Overactive bladder: Secondary | ICD-10-CM | POA: Diagnosis not present

## 2015-11-08 DIAGNOSIS — R310 Gross hematuria: Secondary | ICD-10-CM | POA: Diagnosis not present

## 2015-11-08 LAB — URINALYSIS, COMPLETE
Bilirubin, UA: NEGATIVE
Glucose, UA: NEGATIVE
Leukocytes, UA: NEGATIVE
Nitrite, UA: NEGATIVE
Specific Gravity, UA: 1.025 (ref 1.005–1.030)
Urobilinogen, Ur: 1 mg/dL (ref 0.2–1.0)
pH, UA: 5.5 (ref 5.0–7.5)

## 2015-11-08 LAB — MICROSCOPIC EXAMINATION
Bacteria, UA: NONE SEEN
RBC, UA: >30 /hpf — AB (ref 0–?)

## 2015-11-08 MED ORDER — CIPROFLOXACIN HCL 500 MG PO TABS
500.0000 mg | ORAL_TABLET | Freq: Once | ORAL | Status: AC
  Administered 2015-11-08: 500 mg via ORAL

## 2015-11-10 ENCOUNTER — Encounter: Payer: Self-pay | Admitting: Urology

## 2015-11-10 LAB — CULTURE, URINE COMPREHENSIVE

## 2015-11-10 NOTE — Progress Notes (Signed)
11:23 AM  06/09/16  Roger Rodriguez November 04, 1940 [id number]  Referring provider: [name]., MD [address] [place], [place] [address]  Chief Complaint  Patient presents with  . Cysto    HPI:  75 year old male who returns today status post CT urogram for further workup of microscopic hematuria found to have multiple bilateral nonobstructing nephrolithiasis as well as left distal ureteral calculus with obstruction.  Prostate cancer/ SUI Patient has a personal history of prostate cancer and underwent open radical prostatectomy in March 2006 by Dr. Quillian Quince.  Pathology unknown.  NED.   Most recent PSA <0.1 on 10/02/15.   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    Bladder overactivity /urinary frequency Patient has significant urinary symptoms today including urinary frequency and SUI.  He does have trouble starting his stream and has to sit to void.   He is on Mybetriq 25-50 mg which helps.   He does have a history of Parkinson disease.  Hematuria Microsocpic hematuria times multiple. Status post CT urogram as above.  History of kidney stones He does have a personal history of kidney stones.  He has had several ESWL, last ~3 year go.  He does have chronic back pain so he is not sure if his discomfort is from his back or from stones.  He is not passed a stone recently.   CT urogram shows evidence of left distal ureteral calculi, measuring up to 8 mm. He also has nonobstructing calculi bilaterally.  No dysuria, fevers, or significant pain.   PMH: Past Medical History  Diagnosis Date  . Hyperlipidemia   . Hypertension   . Inguinal hernia   . Arthritis     OA  . Allergy     allergic rhinitis  . Prostate cancer ([place])   . Parkinson's disease ([place])   . History of kidney stones     Surgical History: Past Surgical History  Procedure Laterality Date  . Rotator cuff tear  1993    Repaired  . H/o arthroscopy of right knee   2015  . H/o radical protatectomy    . Vasectomy      History  . Back surgery  2014  . Ureteroscopy with holmium laser lithotripsy  2002?  [name] Lithotripsy  2010    Home Medications:    Medication List       This list is accurate as of: 11/08/15 11:59 PM.  Always use your most recent med list.               carbidopa-levodopa 50-200 MG tablet  Commonly known as:  SINEMET CR  TAKE 1 TABLET BY MOUTH 3 (THREE) TIMES DAILY.     celecoxib 200 MG capsule  Commonly known as:  CELEBREX  TAKE 1 CAPSULE BY MOUTH TWICE DAILY     gabapentin 400 MG capsule  Commonly known as:  NEURONTIN  Take 400 mg by mouth 2 (two) times daily.     hydrochlorothiazide 12.5 MG tablet  Commonly known as:  HYDRODIURIL  Take 1 tablet (12.5 mg total) by mouth daily.     loratadine 10 MG tablet  Commonly known as:  CLARITIN  Take 10 mg by mouth daily.     MYRBETRIQ 50 MG Tb24 tablet  Generic drug:  mirabegron ER  Take by mouth. Reported on 11/08/2015     MYRBETRIQ 25 MG Tb24 tablet  Generic drug:  mirabegron ER  Reported on 11/08/2015  RHINOCORT AQUA 32 MCG/ACT nasal spray  Generic drug:  budesonide  Place into the nose.     rOPINIRole 0.5 MG tablet  Commonly known as:  REQUIP  Take by mouth.     triamcinolone cream 0.1 %  Commonly known as:  KENALOG     vitamin C 500 MG tablet  Commonly known as:  ASCORBIC ACID  Take 500 mg by mouth daily.     Vitamin D 2000 units tablet  Take by mouth.     VITAMIN E NATURAL PO  Take by mouth.        Allergies: No Known Allergies  Family History: Family History  Problem Relation Age of Onset  . Dementia Mother   . Cerebral aneurysm Father   . Prostate cancer Neg Hx   . Bladder Cancer Paternal Uncle   . Kidney cancer Neg Hx   . Liver cancer Sister     Social History:  reports that he has quit smoking. His smoking use included Cigars. His smokeless tobacco use includes Chew. He reports that he drinks alcohol. He reports that he does not  use illicit drugs.  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 His wife passed away this fall of Parkinson's disease/dementia.  Physical Exam: BP 177/96 mmHg  Pulse 79  Ht 5\' 11"  (1.803 m)  Wt 209 lb 14.4 oz (95.21 kg)  BMI 29.29 kg/m2  Constitutional:  Alert and oriented, No acute distress. HEENT: [employer] AT, moist mucus membranes.  Trachea midline, no masses. Cardiovascular: No clubbing, cyanosis, or edema.  RRR. Respiratory: Normal respiratory effort, no increased work of breathing. CTAB. GI: Abdomen is soft, nontender, nondistended, no abdominal masses GU: No CVA tenderness.  Skin: No rashes, bruises or suspicious lesions. Neurologic: Grossly intact, no focal deficits, moving all 4 extremities.  Mild resting tremor noted. Ambulates with cane. Psychiatric: Normal mood and affect.  Laboratory Data: Lab Results  Component Value Date   WBC 6.2 06/19/2014   HGB 14.4 06/19/2014   HCT 43 06/19/2014   MCV 95 04/03/2014   PLT 253 06/19/2014    Lab Results  Component Value Date   CREATININE 1.00 10/30/2015    Lab Results  Component Value Date   PSA <0.1 09/11/2010     Lab Results  Component Value Date   HGBA1C 6.1* 10/02/2015    Urinalysis Results for orders placed or performed in visit on 11/08/15  CULTURE, URINE COMPREHENSIVE  Result Value Ref Range   Urine Culture, Comprehensive Preliminary report (A)    Result 1 Escherichia coli (A)   Microscopic Examination  Result Value Ref Range   WBC, UA 6-10 (A) 0 -  5 /hpf   RBC, UA >30 (A) 0 -  2 /hpf   Epithelial Cells (non renal) 0-10 0 - 10 /hpf   Crystals Present (A) N/A   Crystal Type Calcium Oxalate N/A   Mucus, UA Present (A) Not Estab.   Bacteria, UA None seen None seen/Few  Urinalysis, Complete  Result Value Ref Range   Specific Gravity, UA 1.025 1.005 - 1.030   pH, UA 5.5 5.0 - 7.5   Color, UA Yellow Yellow   Appearance Ur Cloudy (A) Clear   Leukocytes, UA Negative Negative   Protein,  UA 2+ (A) Negative/Trace   Glucose, UA Negative Negative   Ketones, UA Trace (A) Negative   RBC, UA 3+ (A) Negative   Bilirubin, UA Negative Negative   Urobilinogen, Ur 1.0 0.2 - 1.0 mg/dL  Nitrite, UA Negative Negative   Microscopic Examination See below:     Pertinent Imaging: Study Result     CLINICAL DATA: Followup prostate cancer.  EXAM: CT ABDOMEN AND PELVIS WITHOUT AND WITH CONTRAST  TECHNIQUE: Multidetector CT imaging of the abdomen and pelvis was performed following the standard protocol before and following the bolus administration of intravenous contrast.  CONTRAST: 155mL ISOVUE-300 IOPAMIDOL (ISOVUE-300) INJECTION 61%  COMPARISON: 05/24/2012  FINDINGS: Lower chest: The lung bases are clear. No pleural or pericardial effusion noted.  Hepatobiliary: Diffuse hepatic steatosis. Focal fatty sparing is identified within the posterior aspect of the medial segment of left lobe. No suspicious liver abnormality.  Pancreas: No inflammation or mass identified.  Spleen: The spleen appears normal.  Adrenals/Urinary Tract: The adrenal glands are normal. Stone within the mid left kidney measures 5 mm. Several stones in the right renal collecting system are noted. The largest is in the upper pole measuring 7 mm. Bilateral renal cysts identified without complicating features. On the delayed images there is symmetric excretion of contrast material. Left-sided hydronephrosis and hydroureter is identified. There are several stones identified within the distal left ureter. The largest measures 8 mm, image 63 of series 9. Urinary bladder appears normal for degree of distention.  Stomach/Bowel: The stomach is within normal limits. The small bowel loops have a normal course and caliber. No obstruction. Normal appearance of the colon.  Vascular/Lymphatic: Calcified atherosclerotic disease involves the abdominal aorta. No aneurysm. No enlarged retroperitoneal  or mesenteric adenopathy. No enlarged pelvic or inguinal lymph nodes.  Reproductive: Postoperative changes from radical prostatectomy.  Other: No free fluid identified. No fluid collections.  Musculoskeletal: There is spondylosis throughout the thoracic and lumbar spine. Status post L3 through L5 laminectomy.  IMPRESSION: 1. Bilateral nephrolithiasis. 2. Left-sided pelvocaliectasis and hydroureter secondary to multiple distal left ureteral calculi. Ureteral calculi measure up to 8 mm. 3. Kidney cysts 4. Hepatic steatosis 5. Aortic atherosclerosis. These results will be called to the ordering clinician or representative by the Radiologist Assistant, and communication documented in the PACS or zVision Dashboard.   Electronically Signed  By: Kerby Moors M.D.  On: 10/30/2015 10:05    CT scan reviewed personally today and with the patient.  Assessment & Plan:    1. Bilateral nephrolithiasis with left ureteral calculi/ hydronephrosis Recommend treatment of his left-sided stones. This is likely contributing to his back pain.  We discussed various treatment options including ESWL vs. ureteroscopy, laser lithotripsy, and stent. We discussed the risks and benefits of both including bleeding, infection, damage to surrounding structures, efficacy with need for possible further intervention, and need for temporary ureteral stent.    He would like to proceed with left ureteroscopy. We will treat his ureteral stones as well as his left nonobstructing stones. We'll also perform formal cystoscopy at that time to complete his hematuria workup.  Preop urine culture   1. Malignant neoplasm of prostate [facility]) S/p prostatectomy 2006, NED.  PSA undetectable.  Recommend annual PSA. - Urinalysis, Complete  2. OAB (overactive bladder) Continue  Mybetriq 25-50 mg. Patient does not need refills of this time, the device to call when he does.  Discussed that his urinary symptoms are  likely related to his underlying Parkinson's disease.  3. Urinary incontinence, stress, male Wears depends.  4. Microscopic hematuria Likely secondary to stone.  Will perform cysto in OR to complete work up.   Schedule left URS, LL, stent, cysto   Roger Espy, MD  [facility] [address]  [place], [place] [address] [phone]  I spent 30 min with this patient of which greater than 50% was spent in counseling and coordination of care with the patient.

## 2015-11-11 ENCOUNTER — Telehealth: Payer: Self-pay | Admitting: Radiology

## 2015-11-11 ENCOUNTER — Telehealth: Payer: Self-pay

## 2015-11-11 DIAGNOSIS — N390 Urinary tract infection, site not specified: Secondary | ICD-10-CM

## 2015-11-11 MED ORDER — SULFAMETHOXAZOLE-TRIMETHOPRIM 800-160 MG PO TABS: 1.0000 | ORAL_TABLET | Freq: Two times a day (BID) | ORAL | Status: AC

## 2015-11-11 NOTE — Telephone Encounter (Signed)
-----   Message from Hollice Espy, MD sent at 11/11/2015  9:06 AM EDT ----- Please treat with Bactrim DS bid x 7 days.  Please ensure that he gets started on this today. Remind him to present to the emergency room if he develops any fevers, chills, or any other worrisome symptoms. Infections in the setting of stones can be dangerous.  Hollice Espy, MD

## 2015-11-11 NOTE — Telephone Encounter (Signed)
Notified pt of surgery scheduled 11/20/15, pre-admit appt on 5/2 @11 :15 & to call day prior to surgery for arrival time to SDS. Pt voices understanding.

## 2015-11-11 NOTE — Telephone Encounter (Signed)
Spoke with pt in reference to +ucx. Reinforced the need of starting abx today. Pt voiced understanding.

## 2015-11-12 ENCOUNTER — Encounter
Admission: RE | Admit: 2015-11-12 | Discharge: 2015-11-12 | Disposition: A | Discharge status: Home / Self Care | Payer: PPO | Source: Ambulatory Visit | Attending: Urology | Admitting: Urology

## 2015-11-12 DIAGNOSIS — Z01810 Encounter for preprocedural cardiovascular examination: Secondary | ICD-10-CM | POA: Diagnosis not present

## 2015-11-12 DIAGNOSIS — I10 Essential (primary) hypertension: Secondary | ICD-10-CM | POA: Diagnosis not present

## 2015-11-12 HISTORY — DX: Reserved for inherently not codable concepts without codable children: IMO0001

## 2015-11-12 HISTORY — DX: Unspecified urinary incontinence: R32

## 2015-11-12 HISTORY — DX: Overactive bladder: N32.81

## 2015-11-12 HISTORY — DX: Family history of other specified conditions: Z84.89

## 2015-11-12 HISTORY — DX: Hematuria, unspecified: R31.9

## 2015-11-12 HISTORY — DX: Major depressive disorder, single episode, unspecified: F32.9

## 2015-11-12 HISTORY — DX: Myoneural disorder, unspecified: G70.9

## 2015-11-12 HISTORY — DX: Depression, unspecified: F32.A

## 2015-11-12 HISTORY — DX: Allergic rhinitis, unspecified: J30.9

## 2015-11-12 HISTORY — DX: Chronic kidney disease, unspecified: N18.9

## 2015-11-12 NOTE — Patient Instructions (Signed)
  Your procedure is scheduled on:11/20/15 Report to Day Surgery. [facility] SECOND FLOOR To find out your arrival time please call [phone] between 1PM - 3PM on 11/19/15  Remember: Instructions that are not followed completely may result in serious medical risk, up to and including death, or upon the discretion of your surgeon and anesthesiologist your surgery may need to be rescheduled.    __X__ 1. Do not eat food or drink liquids after midnight. No gum chewing or hard candies.     __X__ 2. No Alcohol for 24 hours before or after surgery.   ____ 3. Bring all medications with you on the day of surgery if instructed.    _X___ 4. Notify your doctor if there is any change in your medical condition     (cold, fever, infections).     Do not wear jewelry, make-up, hairpins, clips or nail polish.  Do not wear lotions, powders, or perfumes. You may wear deodorant.  Do not shave 48 hours prior to surgery. Men may shave face and neck.  Do not bring valuables to the hospital.    [facility] is not responsible for any belongings or valuables.               Contacts, dentures or bridgework may not be worn into surgery.  Leave your suitcase in the car. After surgery it may be brought to your room.  For patients admitted to the hospital, discharge time is determined by your                treatment team.   Patients discharged the day of surgery will not be allowed to drive home.   Please read over the following fact sheets that you were given:   Surgical Site Infection Prevention   __X__ Take these medicines the morning of surgery with A SIP OF WATER:    1.GABAPENTIN  2.CARBIDOPA   3.   4.  5.  6.  ____ Fleet Enema (as directed)   ____ Use CHG Soap as directed  ____ Use inhalers on the day of surgery  ____ Stop metformin 2 days prior to surgery    ____ Take 1/2 of usual insulin dose the night before surgery and none on the morning of surgery.   ____ Stop Coumadin/Plavix/aspirin  on  ____ Stop Anti-inflammatories on  ____ Stop supplements until after surgery.    ____ Bring C-Pap to the hospital.

## 2015-11-12 NOTE — Pre-Procedure Instructions (Deleted)
09/06/2015 05/31/2012 05/26/2012    15.0 11.8 (L) 14  43.7 0.34 (L) 0.4   3.72 (L) 4.42   92 91   221 251   9.9 (H) 5.3   31.7 31.7   34.4 35   13.7 13.3   0.0 0.0   0.00 0.00  5.7    4.80    91    31.3 (H)    34.3    14.2    10    0.01     Hemoglobin  Hematocrit  Red Blood Cell Count  MCV  Platelet Count /L  White Blood Cell Count  MCH  MCHC  RDW-CV  Nucleated RBC %  Nucleated RBC Count  WBC (White Blood Cell Count)  RBC (Red Blood Cell Count)  MCV (Mean Corpuscular Volume)  MCH (Mean Corpuscular Hemoglobin)  MCHC (Mean Corpuscular Hemoglobin Concentration)  RDW-CV (Red Cell Distribution Width)  MPV (Mean Platelet Volume)  Immature Granulocyte Count   PROFILE  Component Name  09/06/2015 05/31/2012 05/26/2012    142 (H) 130 100   22 (H) 17  1 1.2 0.9   ... [name]..  136 134 (L) 136  3.6 3.8 4  98 101 104   27 25  9.2 8 (L) 9  28.6    17    73    17.0    13.0    2     Glucose  Urea Nitrogen  Creatinine  GFR(MDRD)  Sodium  Potassium  Chloride  Carbon Dioxide  Calcium  Carbon Dioxide (CO2)  Urea Nitrogen (BUN)  Glomerular Filtration Rate (eGFR), MDRD Estimate  BUN/Crea Ratio  Anion Gap w/K  Magnesium   Component Name  09/06/2015 05/31/2012 05/26/2012    142 (H) 130 100   22 (H) 17  1 1.2 0.9   ... [name]..  136 134 (L) 136  3.6 3.8 4  98 101 104   27 25  9.2 8 (L) 9  28.6    17    73    17.0    13.0    2     Glucose  Urea Nitrogen  Creatinine  GFR(MDRD)  Sodium  Potassium  Chloride  Carbon Dioxide  Calcium  Carbon Dioxide (CO2)  Urea Nitrogen (BUN)  Glomerular Filtration Rate (eGFR), MDRD Estimate  BUN/Crea Ratio  Anion Gap w/K  Magnesium

## 2015-11-12 NOTE — Pre-Procedure Instructions (Deleted)
INFERIOR INFACT NOTED PRIOR TO 1996 ON PREVIOUS EKG

## 2015-11-19 MED ORDER — ACETAMINOPHEN 10 MG/ML IV SOLN
INTRAVENOUS | Status: AC
  Filled 2015-11-19: qty 100

## 2015-11-20 ENCOUNTER — Ambulatory Visit: Payer: PPO | Admitting: Anesthesiology

## 2015-11-20 ENCOUNTER — Encounter
Admission: RE | Disposition: A | Discharge status: Home / Self Care | Payer: Self-pay | Source: Ambulatory Visit | Attending: Urology

## 2015-11-20 ENCOUNTER — Encounter: Payer: Self-pay | Admitting: *Deleted

## 2015-11-20 ENCOUNTER — Ambulatory Visit
Admission: RE | Admit: 2015-11-20 | Discharge: 2015-11-20 | Disposition: A | Discharge status: Home / Self Care | Payer: PPO | Source: Ambulatory Visit | Attending: Urology | Admitting: Urology

## 2015-11-20 DIAGNOSIS — M549 Dorsalgia, unspecified: Secondary | ICD-10-CM | POA: Diagnosis not present

## 2015-11-20 DIAGNOSIS — N202 Calculus of kidney with calculus of ureter: Secondary | ICD-10-CM | POA: Diagnosis not present

## 2015-11-20 DIAGNOSIS — I10 Essential (primary) hypertension: Secondary | ICD-10-CM | POA: Insufficient documentation

## 2015-11-20 DIAGNOSIS — G20 Parkinson's disease: Secondary | ICD-10-CM | POA: Diagnosis not present

## 2015-11-20 DIAGNOSIS — Z87891 Personal history of nicotine dependence: Secondary | ICD-10-CM | POA: Insufficient documentation

## 2015-11-20 DIAGNOSIS — J309 Allergic rhinitis, unspecified: Secondary | ICD-10-CM | POA: Insufficient documentation

## 2015-11-20 DIAGNOSIS — N393 Stress incontinence (female) (male): Secondary | ICD-10-CM | POA: Diagnosis not present

## 2015-11-20 DIAGNOSIS — Z800 Family history of malignant neoplasm of digestive organs: Secondary | ICD-10-CM | POA: Diagnosis not present

## 2015-11-20 DIAGNOSIS — G8929 Other chronic pain: Secondary | ICD-10-CM | POA: Insufficient documentation

## 2015-11-20 DIAGNOSIS — I700 Atherosclerosis of aorta: Secondary | ICD-10-CM | POA: Insufficient documentation

## 2015-11-20 DIAGNOSIS — Z8546 Personal history of malignant neoplasm of prostate: Secondary | ICD-10-CM | POA: Diagnosis not present

## 2015-11-20 DIAGNOSIS — N200 Calculus of kidney: Secondary | ICD-10-CM | POA: Diagnosis not present

## 2015-11-20 DIAGNOSIS — Z9079 Acquired absence of other genital organ(s): Secondary | ICD-10-CM | POA: Diagnosis not present

## 2015-11-20 DIAGNOSIS — E785 Hyperlipidemia, unspecified: Secondary | ICD-10-CM | POA: Diagnosis not present

## 2015-11-20 DIAGNOSIS — I129 Hypertensive chronic kidney disease with stage 1 through stage 4 chronic kidney disease, or unspecified chronic kidney disease: Secondary | ICD-10-CM | POA: Diagnosis not present

## 2015-11-20 DIAGNOSIS — Z818 Family history of other mental and behavioral disorders: Secondary | ICD-10-CM | POA: Insufficient documentation

## 2015-11-20 DIAGNOSIS — Z8052 Family history of malignant neoplasm of bladder: Secondary | ICD-10-CM | POA: Insufficient documentation

## 2015-11-20 DIAGNOSIS — N281 Cyst of kidney, acquired: Secondary | ICD-10-CM | POA: Insufficient documentation

## 2015-11-20 DIAGNOSIS — K760 Fatty (change of) liver, not elsewhere classified: Secondary | ICD-10-CM | POA: Insufficient documentation

## 2015-11-20 DIAGNOSIS — Z9889 Other specified postprocedural states: Secondary | ICD-10-CM | POA: Diagnosis not present

## 2015-11-20 DIAGNOSIS — Z79899 Other long term (current) drug therapy: Secondary | ICD-10-CM | POA: Diagnosis not present

## 2015-11-20 DIAGNOSIS — N201 Calculus of ureter: Secondary | ICD-10-CM | POA: Diagnosis not present

## 2015-11-20 DIAGNOSIS — M1990 Unspecified osteoarthritis, unspecified site: Secondary | ICD-10-CM | POA: Insufficient documentation

## 2015-11-20 DIAGNOSIS — N3281 Overactive bladder: Secondary | ICD-10-CM | POA: Diagnosis not present

## 2015-11-20 DIAGNOSIS — Z87442 Personal history of urinary calculi: Secondary | ICD-10-CM | POA: Insufficient documentation

## 2015-11-20 DIAGNOSIS — R3129 Other microscopic hematuria: Secondary | ICD-10-CM | POA: Insufficient documentation

## 2015-11-20 DIAGNOSIS — N189 Chronic kidney disease, unspecified: Secondary | ICD-10-CM | POA: Diagnosis not present

## 2015-11-20 HISTORY — PX: CYSTOSCOPY/URETEROSCOPY/HOLMIUM LASER/STENT PLACEMENT: SHX6546

## 2015-11-20 SURGERY — CYSTOSCOPY/URETEROSCOPY/HOLMIUM LASER/STENT PLACEMENT
Anesthesia: General | Laterality: Left | Wound class: Clean Contaminated

## 2015-11-20 MED ORDER — TAMSULOSIN HCL 0.4 MG PO CAPS: 0.4000 mg | ORAL_CAPSULE | Freq: Every day | ORAL | Status: DC

## 2015-11-20 MED ORDER — DOCUSATE SODIUM 100 MG PO CAPS: 100.0000 mg | ORAL_CAPSULE | Freq: Two times a day (BID) | ORAL | Status: DC

## 2015-11-20 MED ORDER — ONDANSETRON HCL 4 MG/2ML IJ SOLN
INTRAMUSCULAR | Status: DC | PRN
  Administered 2015-11-20: 4 mg via INTRAVENOUS

## 2015-11-20 MED ORDER — PROPOFOL 10 MG/ML IV BOLUS
INTRAVENOUS | Status: DC | PRN
  Administered 2015-11-20: 150 mg via INTRAVENOUS

## 2015-11-20 MED ORDER — CEFAZOLIN SODIUM-DEXTROSE 2-4 GM/100ML-% IV SOLN
2.0000 g | Freq: Once | INTRAVENOUS | Status: AC
  Administered 2015-11-20: 2 g via INTRAVENOUS

## 2015-11-20 MED ORDER — FENTANYL CITRATE (PF) 100 MCG/2ML IJ SOLN
INTRAMUSCULAR | Status: DC | PRN
  Administered 2015-11-20: 100 ug via INTRAVENOUS

## 2015-11-20 MED ORDER — OXYCODONE-ACETAMINOPHEN 5-325 MG PO TABS: 1.0000 | ORAL_TABLET | ORAL | Status: DC | PRN

## 2015-11-20 MED ORDER — CEFAZOLIN SODIUM-DEXTROSE 2-4 GM/100ML-% IV SOLN
INTRAVENOUS | Status: AC
  Administered 2015-11-20: 2 g via INTRAVENOUS
  Filled 2015-11-20: qty 100

## 2015-11-20 MED ORDER — OXYCODONE HCL 5 MG PO TABS: 5.0000 mg | ORAL_TABLET | Freq: Once | ORAL | Status: DC | PRN

## 2015-11-20 MED ORDER — ROCURONIUM BROMIDE 100 MG/10ML IV SOLN
INTRAVENOUS | Status: DC | PRN
  Administered 2015-11-20 (×2): 10 mg via INTRAVENOUS

## 2015-11-20 MED ORDER — SUGAMMADEX SODIUM 200 MG/2ML IV SOLN
INTRAVENOUS | Status: DC | PRN
  Administered 2015-11-20: 190 mg via INTRAVENOUS

## 2015-11-20 MED ORDER — SUCCINYLCHOLINE CHLORIDE 20 MG/ML IJ SOLN
INTRAMUSCULAR | Status: DC | PRN
  Administered 2015-11-20: 100 mg via INTRAVENOUS

## 2015-11-20 MED ORDER — OXYCODONE HCL 5 MG/5ML PO SOLN: 5.0000 mg | Freq: Once | ORAL | Status: DC | PRN

## 2015-11-20 MED ORDER — FENTANYL CITRATE (PF) 100 MCG/2ML IJ SOLN: 25.0000 ug | INTRAMUSCULAR | Status: DC | PRN

## 2015-11-20 MED ORDER — FAMOTIDINE 20 MG PO TABS
20.0000 mg | ORAL_TABLET | Freq: Once | ORAL | Status: AC
  Administered 2015-11-20: 20 mg via ORAL

## 2015-11-20 MED ORDER — PHENYLEPHRINE HCL 10 MG/ML IJ SOLN
INTRAMUSCULAR | Status: DC | PRN
  Administered 2015-11-20 (×3): 100 ug via INTRAVENOUS

## 2015-11-20 MED ORDER — LIDOCAINE HCL (CARDIAC) 20 MG/ML IV SOLN
INTRAVENOUS | Status: DC | PRN
  Administered 2015-11-20: 60 mg via INTRAVENOUS

## 2015-11-20 MED ORDER — FAMOTIDINE 20 MG PO TABS
ORAL_TABLET | ORAL | Status: AC
  Administered 2015-11-20: 20 mg via ORAL
  Filled 2015-11-20: qty 1

## 2015-11-20 MED ORDER — LACTATED RINGERS IV SOLN
INTRAVENOUS | Status: DC
  Administered 2015-11-20: 11:00:00 via INTRAVENOUS

## 2015-11-20 MED ORDER — IOTHALAMATE MEGLUMINE 43 % IV SOLN
INTRAVENOUS | Status: DC | PRN
  Administered 2015-11-20: 10 mL

## 2015-11-20 SURGICAL SUPPLY — 29 items
ADAPTER SCOPE UROLOK II (MISCELLANEOUS) IMPLANT
BAG DRAIN CYSTO-URO LG1000N (MISCELLANEOUS) ×2 IMPLANT
BASKET ZERO TIP 1.9FR (BASKET) ×2 IMPLANT
CATH URETL 5X70 OPEN END (CATHETERS) ×2 IMPLANT
CNTNR SPEC 2.5X3XGRAD LEK (MISCELLANEOUS) ×1
CONRAY 43 FOR UROLOGY 50M (MISCELLANEOUS) ×2 IMPLANT
CONT SPEC 4OZ STER OR WHT (MISCELLANEOUS) ×1
CONTAINER SPEC 2.5X3XGRAD LEK (MISCELLANEOUS) ×1 IMPLANT
DRAPE UTILITY 15X26 TOWEL STRL (DRAPES) ×2 IMPLANT
GLOVE BIO SURGEON STRL SZ 6.5 (GLOVE) ×4 IMPLANT
GOWN STRL REUS W/ TWL LRG LVL3 (GOWN DISPOSABLE) ×2 IMPLANT
GOWN STRL REUS W/TWL LRG LVL3 (GOWN DISPOSABLE) ×2
GUIDEWIRE GREEN .038 145CM (MISCELLANEOUS) ×2 IMPLANT
INTRODUCER DILATOR DOUBLE (INTRODUCER) IMPLANT
KIT RM TURNOVER CYSTO AR (KITS) ×2 IMPLANT
LASER FIBER 200M SMARTSCOPE (Laser) ×2 IMPLANT
PACK CYSTO AR (MISCELLANEOUS) ×2 IMPLANT
PREP PVP WINGED SPONGE (MISCELLANEOUS) ×2 IMPLANT
PUMP SINGLE ACTION SAP (PUMP) IMPLANT
SENSORWIRE 0.038 NOT ANGLED (WIRE) ×4
SET CYSTO W/LG BORE CLAMP LF (SET/KITS/TRAYS/PACK) ×2 IMPLANT
SHEATH URETERAL 12FRX35CM (MISCELLANEOUS) IMPLANT
SOL .9 NS 3000ML IRR  AL (IV SOLUTION) ×1
SOL .9 NS 3000ML IRR UROMATIC (IV SOLUTION) ×1 IMPLANT
STENT URET 6FRX24 CONTOUR (STENTS) IMPLANT
STENT URET 6FRX26 CONTOUR (STENTS) ×2 IMPLANT
SURGILUBE 2OZ TUBE FLIPTOP (MISCELLANEOUS) ×2 IMPLANT
WATER STERILE IRR 1000ML POUR (IV SOLUTION) ×2 IMPLANT
WIRE SENSOR 0.038 NOT ANGLED (WIRE) ×2 IMPLANT

## 2015-11-20 NOTE — H&P (View-Only) (Signed)
11:23 AM  06/09/16  Roger Rodriguez November 04, 1940 [id number]  Referring provider: [name]., MD [address] [place], [place] [address]  Chief Complaint  Patient presents with  . Cysto    HPI:  75 year old male who returns today status post CT urogram for further workup of microscopic hematuria found to have multiple bilateral nonobstructing nephrolithiasis as well as left distal ureteral calculus with obstruction.  Prostate cancer/ SUI Patient has a personal history of prostate cancer and underwent open radical prostatectomy in March 2006 by Dr. Quillian Quince.  Pathology unknown.  NED.   Most recent PSA <0.1 on 10/02/15.   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    Bladder overactivity /urinary frequency Patient has significant urinary symptoms today including urinary frequency and SUI.  He does have trouble starting his stream and has to sit to void.   He is on Mybetriq 25-50 mg which helps.   He does have a history of Parkinson disease.  Hematuria Microsocpic hematuria times multiple. Status post CT urogram as above.  History of kidney stones He does have a personal history of kidney stones.  He has had several ESWL, last ~3 year go.  He does have chronic back pain so he is not sure if his discomfort is from his back or from stones.  He is not passed a stone recently.   CT urogram shows evidence of left distal ureteral calculi, measuring up to 8 mm. He also has nonobstructing calculi bilaterally.  No dysuria, fevers, or significant pain.   PMH: Past Medical History  Diagnosis Date  . Hyperlipidemia   . Hypertension   . Inguinal hernia   . Arthritis     OA  . Allergy     allergic rhinitis  . Prostate cancer ([place])   . Parkinson's disease ([place])   . History of kidney stones     Surgical History: Past Surgical History  Procedure Laterality Date  . Rotator cuff tear  1993    Repaired  . H/o arthroscopy of right knee   2015  . H/o radical protatectomy    . Vasectomy      History  . Back surgery  2014  . Ureteroscopy with holmium laser lithotripsy  2002?  [name] Lithotripsy  2010    Home Medications:    Medication List       This list is accurate as of: 11/08/15 11:59 PM.  Always use your most recent med list.               carbidopa-levodopa 50-200 MG tablet  Commonly known as:  SINEMET CR  TAKE 1 TABLET BY MOUTH 3 (THREE) TIMES DAILY.     celecoxib 200 MG capsule  Commonly known as:  CELEBREX  TAKE 1 CAPSULE BY MOUTH TWICE DAILY     gabapentin 400 MG capsule  Commonly known as:  NEURONTIN  Take 400 mg by mouth 2 (two) times daily.     hydrochlorothiazide 12.5 MG tablet  Commonly known as:  HYDRODIURIL  Take 1 tablet (12.5 mg total) by mouth daily.     loratadine 10 MG tablet  Commonly known as:  CLARITIN  Take 10 mg by mouth daily.     MYRBETRIQ 50 MG Tb24 tablet  Generic drug:  mirabegron ER  Take by mouth. Reported on 11/08/2015     MYRBETRIQ 25 MG Tb24 tablet  Generic drug:  mirabegron ER  Reported on 11/08/2015  RHINOCORT AQUA 32 MCG/ACT nasal spray  Generic drug:  budesonide  Place into the nose.     rOPINIRole 0.5 MG tablet  Commonly known as:  REQUIP  Take by mouth.     triamcinolone cream 0.1 %  Commonly known as:  KENALOG     vitamin C 500 MG tablet  Commonly known as:  ASCORBIC ACID  Take 500 mg by mouth daily.     Vitamin D 2000 units tablet  Take by mouth.     VITAMIN E NATURAL PO  Take by mouth.        Allergies: No Known Allergies  Family History: Family History  Problem Relation Age of Onset  . Dementia Mother   . Cerebral aneurysm Father   . Prostate cancer Neg Hx   . Bladder Cancer Paternal Uncle   . Kidney cancer Neg Hx   . Liver cancer Sister     Social History:  reports that he has quit smoking. His smoking use included Cigars. His smokeless tobacco use includes Chew. He reports that he drinks alcohol. He reports that he does not  use illicit drugs.  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 His wife passed away this fall of Parkinson's disease/dementia.  Physical Exam: BP 177/96 mmHg  Pulse 79  Ht 5\' 11"  (1.803 m)  Wt 209 lb 14.4 oz (95.21 kg)  BMI 29.29 kg/m2  Constitutional:  Alert and oriented, No acute distress. HEENT: [place] AT, moist mucus membranes.  Trachea midline, no masses. Cardiovascular: No clubbing, cyanosis, or edema.  RRR. Respiratory: Normal respiratory effort, no increased work of breathing. CTAB. GI: Abdomen is soft, nontender, nondistended, no abdominal masses GU: No CVA tenderness.  Skin: No rashes, bruises or suspicious lesions. Neurologic: Grossly intact, no focal deficits, moving all 4 extremities.  Mild resting tremor noted. Ambulates with cane. Psychiatric: Normal mood and affect.  Laboratory Data: Lab Results  Component Value Date   WBC 6.2 06/19/2014   HGB 14.4 06/19/2014   HCT 43 06/19/2014   MCV 95 04/03/2014   PLT 253 06/19/2014    Lab Results  Component Value Date   CREATININE 1.00 10/30/2015    Lab Results  Component Value Date   PSA <0.1 09/11/2010     Lab Results  Component Value Date   HGBA1C 6.1* 10/02/2015    Urinalysis Results for orders placed or performed in visit on 11/08/15  CULTURE, URINE COMPREHENSIVE  Result Value Ref Range   Urine Culture, Comprehensive Preliminary report (A)    Result 1 Escherichia coli (A)   Microscopic Examination  Result Value Ref Range   WBC, UA 6-10 (A) 0 -  5 /hpf   RBC, UA >30 (A) 0 -  2 /hpf   Epithelial Cells (non renal) 0-10 0 - 10 /hpf   Crystals Present (A) N/A   Crystal Type Calcium Oxalate N/A   Mucus, UA Present (A) Not Estab.   Bacteria, UA None seen None seen/Few  Urinalysis, Complete  Result Value Ref Range   Specific Gravity, UA 1.025 1.005 - 1.030   pH, UA 5.5 5.0 - 7.5   Color, UA Yellow Yellow   Appearance Ur Cloudy (A) Clear   Leukocytes, UA Negative Negative   Protein,  UA 2+ (A) Negative/Trace   Glucose, UA Negative Negative   Ketones, UA Trace (A) Negative   RBC, UA 3+ (A) Negative   Bilirubin, UA Negative Negative   Urobilinogen, Ur 1.0 0.2 - 1.0 mg/dL  Nitrite, UA Negative Negative   Microscopic Examination See below:     Pertinent Imaging: Study Result     CLINICAL DATA: Followup prostate cancer.  EXAM: CT ABDOMEN AND PELVIS WITHOUT AND WITH CONTRAST  TECHNIQUE: Multidetector CT imaging of the abdomen and pelvis was performed following the standard protocol before and following the bolus administration of intravenous contrast.  CONTRAST: 155mL ISOVUE-300 IOPAMIDOL (ISOVUE-300) INJECTION 61%  COMPARISON: 05/24/2012  FINDINGS: Lower chest: The lung bases are clear. No pleural or pericardial effusion noted.  Hepatobiliary: Diffuse hepatic steatosis. Focal fatty sparing is identified within the posterior aspect of the medial segment of left lobe. No suspicious liver abnormality.  Pancreas: No inflammation or mass identified.  Spleen: The spleen appears normal.  Adrenals/Urinary Tract: The adrenal glands are normal. Stone within the mid left kidney measures 5 mm. Several stones in the right renal collecting system are noted. The largest is in the upper pole measuring 7 mm. Bilateral renal cysts identified without complicating features. On the delayed images there is symmetric excretion of contrast material. Left-sided hydronephrosis and hydroureter is identified. There are several stones identified within the distal left ureter. The largest measures 8 mm, image 63 of series 9. Urinary bladder appears normal for degree of distention.  Stomach/Bowel: The stomach is within normal limits. The small bowel loops have a normal course and caliber. No obstruction. Normal appearance of the colon.  Vascular/Lymphatic: Calcified atherosclerotic disease involves the abdominal aorta. No aneurysm. No enlarged retroperitoneal  or mesenteric adenopathy. No enlarged pelvic or inguinal lymph nodes.  Reproductive: Postoperative changes from radical prostatectomy.  Other: No free fluid identified. No fluid collections.  Musculoskeletal: There is spondylosis throughout the thoracic and lumbar spine. Status post L3 through L5 laminectomy.  IMPRESSION: 1. Bilateral nephrolithiasis. 2. Left-sided pelvocaliectasis and hydroureter secondary to multiple distal left ureteral calculi. Ureteral calculi measure up to 8 mm. 3. Kidney cysts 4. Hepatic steatosis 5. Aortic atherosclerosis. These results will be called to the ordering clinician or representative by the Radiologist Assistant, and communication documented in the PACS or zVision Dashboard.   Electronically Signed  By: Kerby Moors M.D.  On: 10/30/2015 10:05    CT scan reviewed personally today and with the patient.  Assessment & Plan:    1. Bilateral nephrolithiasis with left ureteral calculi/ hydronephrosis Recommend treatment of his left-sided stones. This is likely contributing to his back pain.  We discussed various treatment options including ESWL vs. ureteroscopy, laser lithotripsy, and stent. We discussed the risks and benefits of both including bleeding, infection, damage to surrounding structures, efficacy with need for possible further intervention, and need for temporary ureteral stent.    He would like to proceed with left ureteroscopy. We will treat his ureteral stones as well as his left nonobstructing stones. We'll also perform formal cystoscopy at that time to complete his hematuria workup.  Preop urine culture   1. Malignant neoplasm of prostate [facility]) S/p prostatectomy 2006, NED.  PSA undetectable.  Recommend annual PSA. - Urinalysis, Complete  2. OAB (overactive bladder) Continue  Mybetriq 25-50 mg. Patient does not need refills of this time, the device to call when he does.  Discussed that his urinary symptoms are  likely related to his underlying Parkinson's disease.  3. Urinary incontinence, stress, male Wears depends.  4. Microscopic hematuria Likely secondary to stone.  Will perform cysto in OR to complete work up.   Schedule left URS, LL, stent, cysto   Hollice Espy, MD  [facility] [address]  [place], [place] [address] [phone]  I spent 30 min with this patient of which greater than 50% was spent in counseling and coordination of care with the patient.

## 2015-11-20 NOTE — Anesthesia Postprocedure Evaluation (Signed)
Anesthesia Post Note  Patient: Roger Rodriguez  Procedure(s) Performed: Procedure(s) (LRB): CYSTOSCOPY/URETEROSCOPY/HOLMIUM LASER/STENT PLACEMENT (Left)  Patient location during evaluation: PACU Anesthesia Type: General Level of consciousness: awake and alert Pain management: pain level controlled Vital Signs Assessment: post-procedure vital signs reviewed and stable Respiratory status: spontaneous breathing, nonlabored ventilation, respiratory function stable and patient connected to nasal cannula oxygen Cardiovascular status: blood pressure returned to baseline and stable Postop Assessment: no signs of nausea or vomiting Anesthetic complications: no    Last Vitals:  Filed Vitals:   11/20/15 1312 11/20/15 1319  BP:  152/92  Pulse: 68 69  Temp: 36.3 C   Resp: 14 12    Last Pain:  Filed Vitals:   11/20/15 1319  PainSc: 4                  Tahesha Skeet K Hollin Crewe

## 2015-11-20 NOTE — Op Note (Signed)
Date of procedure: 11/20/2015  Preoperative diagnosis:  1. Left distal ureteral stones 2. Left nonobstructing calculus 3. Hematuria   Postoperative diagnosis:  1. Same as above   Procedure: 1. Cystoscopy 2. Left ureteroscopy 3. Laser lithotripsy 4. Left ureteral stent placement 5. Basket extraction of Stone fragment 6. Left retrograde pyelogram  Surgeon: Hollice Espy, MD  Anesthesia: General  Complications: None  Intraoperative findings: Multiple large left distal ureteral stones treated. Smaller nonobstructing lower pole stone extracted via basket.  EBL: Minimal  Specimens: Stone fragment  Drains: 6 x 26 French double-J ureteral stent on left  Indication: Roger Rodriguez is a 75 y.o. patient with microscopic hematuria who underwent further workup with CT urogram which demonstrated several stones in the left distal ureter up to 8 mm in size. He also had a small left nonobstructing stone..  After reviewing the management options for treatment, he elected to proceed with the above surgical procedure(s). We have discussed the potential benefits and risks of the procedure, side effects of the proposed treatment, the likelihood of the patient achieving the goals of the procedure, and any potential problems that might occur during the procedure or recuperation. Informed consent has been obtained.  Description of procedure:  The patient was taken to the operating room and general anesthesia was induced.  The patient was placed in the dorsal lithotomy position, prepped and draped in the usual sterile fashion, and preoperative antibiotics were administered. A preoperative time-out was performed.   At this point in time, a rigid 21 French cystoscope was advanced per urethra into the bladder. The bladder was carefully inspected reveals no tumors, ulcerations, or stones.  She was then turned to left ureteral orifice which was cannulated using a 5 [place] open-ended ureteral catheter. A  gentle retrograde pyelogram was performed which revealed multiple spherical filling defects in the distal ureter consistent with his known stones. The contrast did not pass above the iliacs presumably secondary to an obstructing stone at this level.  A sensor wire was then passed alongside of these stones up to level of the kidney without difficulty. The wire was snapped in place as a safety wire.  A semirigid ureteroscope was then advanced alongside the wire to the distal ureter. At this point in time, multiple large ureteral stones were encountered. A 200  laser fiber was then brought in and using settings of 0.8 J and 10 Hz, the stones were fragmented into very small pieces. Each of these pieces was then basket extracted using a 1.9 [place] to plus nitinol basket. Care was taken to ensure that each fragment was removed. The scope was then able to be passed up just past the iliac vessels where there were no obvious residual stones identified. A second Super Stiff wire was then advanced through the scope and a direct visualization up to level of the renal pelvis.   The semirigid ureteroscope was then removed and a flexible single-channel Wolfe ureteroscope was advanced over the Super Stiff wire up to level of the renal pelvis without difficulty under fluoroscopic guidance. The wire was then withdrawn. Formal pyeloscopy was performed visualizing each and every calyx. A gentle retrograde pyelogram was performed at the level of the UPJ to ensure that each calyx had been directly visualized. There was some amorphous debris within the kidney and a small stone within the mid/lower pole calyx which was grasped using the basket. The scope was then backed down the length of the ureter once it was deemed that there were no  other residual stones or stone fragments in the upper tract collecting system. Upon removing the scope, careful inspection of the ureter revealed no residual stone fragments, injury to the ureter.  There was some mild edema within the distal ureter presumably secondary to the stone and manipulation. The stones were passed off as stone fragment for stone analysis. The safety wire was then backloaded over a rigid cystoscope and a 6 x 26 French double-J ureteral stent was advanced over the wire up to the level of the kidney. The wire was partially drawn until full coil was noted within the renal pelvis. The wire was then fully withdrawn until coil was noted within the bladder. The string was removed from the stent. The bladder then was then drained.  The patient was cleaned and dried, he was repositioned the supine position after being reversed from anesthesia. He was taken to the PACU in stable condition.  Plan: Patient will f/u next week for cystoscopy/ stent removal.  He will then have an RUS in 4 week and consideration of metabolic work up.    Hollice Espy, M.D.

## 2015-11-20 NOTE — Anesthesia Procedure Notes (Signed)
Procedure Name: Intubation Date/Time: 11/20/2015 11:09 AM Performed by: Dionne Bucy Pre-anesthesia Checklist: Patient identified, Patient being monitored, Timeout performed, Emergency Drugs available and Suction available Patient Re-evaluated:Patient Re-evaluated prior to inductionOxygen Delivery Method: Circle system utilized Preoxygenation: Pre-oxygenation with 100% oxygen Intubation Type: IV induction Ventilation: Mask ventilation without difficulty Laryngoscope Size: Mac and 4 Grade View: Grade II Tube type: Oral Tube size: 7.5 mm Number of attempts: 1 Airway Equipment and Method: Stylet Placement Confirmation: ETT inserted through vocal cords under direct vision,  positive ETCO2 and breath sounds checked- equal and bilateral Secured at: 23 cm Tube secured with: Tape Dental Injury: Teeth and Oropharynx as per pre-operative assessment

## 2015-11-20 NOTE — Discharge Instructions (Signed)
You have a ureteral stent in place.  This is a tube that extends from your kidney to your bladder.  This may cause urinary bleeding, burning with urination, and urinary frequency.  Please call our office or present to the ED if you develop fevers >101 or pain which is not able to be controlled with oral pain medications.  You may be given either Flomax and/ or ditropan to help with bladder spasms and stent pain in addition to pain medications.   ° °[facility] °[address], [address] °[place], [place] [address] °[phone] ° ° ° °AMBULATORY SURGERY  °DISCHARGE INSTRUCTIONS ° ° °1) The drugs that you were given will stay in your system until tomorrow so for the next 24 hours you should not: ° °A) Drive an automobile °B) Make any legal decisions °C) Drink any alcoholic beverage ° ° °2) You may resume regular meals tomorrow.  Today it is better to start with liquids and gradually work up to solid foods. ° °You may eat anything you prefer, but it is better to start with liquids, then soup and crackers, and gradually work up to solid foods. ° ° °3) Please notify your doctor immediately if you have any unusual bleeding, trouble breathing, redness and pain at the surgery site, drainage, fever, or pain not relieved by medication. ° ° ° °4) Additional Instructions: ° ° ° ° ° ° ° °Please contact your physician with any problems or Same Day Surgery at [phone], Monday through Friday 6 am to 4 pm, or [place] [facility] [place] Main number at [phone]. °

## 2015-11-20 NOTE — Transfer of Care (Signed)
Immediate Anesthesia Transfer of Care Note  Patient: Roger Rodriguez  Procedure(s) Performed: Procedure(s): CYSTOSCOPY/URETEROSCOPY/HOLMIUM LASER/STENT PLACEMENT (Left)  Patient Location: PACU  Anesthesia Type:General  Level of Consciousness: sedated  Airway & Oxygen Therapy: Patient Spontanous Breathing and Patient connected to face mask oxygen  Post-op Assessment: Report given to RN and Post -op Vital signs reviewed and stable  Post vital signs: Reviewed and stable  Last Vitals:  Filed Vitals:   11/20/15 1012 11/20/15 1240  BP: 144/82 136/82  Pulse: 85 68  Temp: 35.5 C 36.3 C  Resp: 16 13    Last Pain:  Filed Vitals:   11/20/15 1242  PainSc: 4          Complications: No apparent anesthesia complications

## 2015-11-20 NOTE — Interval H&P Note (Signed)
History and Physical Interval Note:  11/20/2015 10:07 AM  Roger Rodriguez  has presented today for surgery, with the diagnosis of microhematuria,left kidney stones  The various methods of treatment have been discussed with the patient and family. After consideration of risks, benefits and other options for treatment, the patient has consented to  Procedure(s): CYSTOSCOPY/URETEROSCOPY/HOLMIUM LASER/STENT PLACEMENT (Left) as a surgical intervention .  [name] history has been reviewed, patient examined, no change in status, stable for surgery.  I have reviewed the patient's chart and labs.  Questions were answered to the patient's satisfaction.     Hollice Espy

## 2015-11-20 NOTE — Anesthesia Preprocedure Evaluation (Signed)
Anesthesia Evaluation  Patient identified by MRN, date of birth, ID band Patient awake    Reviewed: Allergy & Precautions, H&P , NPO status , Patient's Chart, lab work & pertinent test results  History of Anesthesia Complications (+) Family history of anesthesia reaction and history of anesthetic complications  Airway Mallampati: III  TM Distance: >3 FB Neck ROM: full    Dental  (+) Poor Dentition, Chipped   Pulmonary neg shortness of breath, former smoker,    Pulmonary exam normal breath sounds clear to auscultation       Cardiovascular Exercise Tolerance: Good hypertension, (-) angina(-) Past MI and (-) DOE Normal cardiovascular exam Rhythm:regular Rate:Normal     Neuro/Psych PSYCHIATRIC DISORDERS Depression  Neuromuscular disease    GI/Hepatic negative GI ROS, (+) Hepatitis -  Endo/Other  negative endocrine ROS  Renal/GU Renal disease  negative genitourinary   Musculoskeletal  (+) Arthritis ,   Abdominal   Peds  Hematology negative hematology ROS (+)   Anesthesia Other Findings Past Medical History:   Hyperlipidemia                                               Inguinal hernia                                              Arthritis                                                      Comment:OA   Allergy                                                        Comment:allergic rhinitis   Prostate cancer ([place])                                        Parkinson's disease ([place])                                    History of kidney stones                                     Hypertension                                                   Comment:no meds since parkinsons meds started   Shortness of breath dyspnea  Comment:doe secondary to parkinsons   Depression                                                   Chronic kidney disease                                      Comment:stones   Neuromuscular disorder ([place])                                   Comment:parkinsons   Overactive bladder                                           Incontinence of urine                                        Hematuria                                                    Rhinitis, allergic                                           Family history of adverse reaction to anesthes*             Past Surgical History:   Rotator Cuff tear                                1993           Comment:Repaired   H/O arthroscopy of right knee                    2015         H/O radical protatectomy                                      VASECTOMY                                                       Comment:History   BACK SURGERY                                     2014         URETEROSCOPY WITH HOLMIUM LASER LITHOTRIPSY      2002?        LITHOTRIPSY  2010         EYE SURGERY                                                   HERNIA REPAIR                                                 FRACTURE SURGERY                                                Comment: ankle    JOINT REPLACEMENT                                               Comment:right knee     Reproductive/Obstetrics negative OB ROS                             Anesthesia Physical Anesthesia Plan  ASA: III  Anesthesia Plan: General ETT   Post-op Pain Management:    Induction:   Airway Management Planned:   Additional Equipment:   Intra-op Plan:   Post-operative Plan:   Informed Consent: I have reviewed the patients History and Physical, chart, labs and discussed the procedure including the risks, benefits and alternatives for the proposed anesthesia with the patient or authorized representative who has indicated his/her understanding and acceptance.   Dental Advisory Given  Plan Discussed with: Anesthesiologist, CRNA and Surgeon  Anesthesia Plan  Comments:         Anesthesia Quick Evaluation

## 2015-11-27 ENCOUNTER — Telehealth: Payer: Self-pay | Admitting: Urology

## 2015-11-27 DIAGNOSIS — N200 Calculus of kidney: Secondary | ICD-10-CM

## 2015-11-27 LAB — STONE ANALYSIS
Ca Oxalate,Dihydrate: 10 %
Ca Oxalate,Monohydr.: 80 %
Ca phos cry stone ql IR: 5 %
Stone Weight KSTONE: 149 mg
Uric Acid: 5 %

## 2015-11-27 NOTE — Telephone Encounter (Signed)
I had a message that he needed a 4 week follow up with a RUS prior but I do not have an order for the RUS. Can I get an order please and thank you?   Sharyn Lull

## 2015-11-28 ENCOUNTER — Encounter: Payer: Self-pay | Admitting: Urology

## 2015-11-28 ENCOUNTER — Ambulatory Visit (INDEPENDENT_AMBULATORY_CARE_PROVIDER_SITE_OTHER): Payer: PPO | Admitting: Urology

## 2015-11-28 VITALS — BP 148/82 | HR 78 | Ht 71.0 in | Wt 208.0 lb

## 2015-11-28 DIAGNOSIS — N390 Urinary tract infection, site not specified: Secondary | ICD-10-CM | POA: Diagnosis not present

## 2015-11-28 DIAGNOSIS — N200 Calculus of kidney: Secondary | ICD-10-CM | POA: Diagnosis not present

## 2015-11-28 DIAGNOSIS — H40003 Preglaucoma, unspecified, bilateral: Secondary | ICD-10-CM | POA: Diagnosis not present

## 2015-11-28 DIAGNOSIS — R3129 Other microscopic hematuria: Secondary | ICD-10-CM | POA: Diagnosis not present

## 2015-11-28 DIAGNOSIS — C61 Malignant neoplasm of prostate: Secondary | ICD-10-CM

## 2015-11-28 LAB — URINALYSIS, COMPLETE
Bilirubin, UA: NEGATIVE
Glucose, UA: NEGATIVE
Ketones, UA: NEGATIVE
Nitrite, UA: NEGATIVE
Specific Gravity, UA: 1.03 (ref 1.005–1.030)
Urobilinogen, Ur: 0.2 mg/dL (ref 0.2–1.0)
pH, UA: 5.5 (ref 5.0–7.5)

## 2015-11-28 LAB — MICROSCOPIC EXAMINATION
Epithelial Cells (non renal): NONE SEEN /hpf (ref 0–10)
WBC, UA: NONE SEEN /hpf (ref 0–?)

## 2015-11-28 MED ORDER — CIPROFLOXACIN HCL 500 MG PO TABS
500.0000 mg | ORAL_TABLET | Freq: Once | ORAL | Status: AC
  Administered 2015-11-28: 500 mg via ORAL

## 2015-11-28 MED ORDER — LIDOCAINE HCL 2 % EX GEL
1.0000 "application " | Freq: Once | CUTANEOUS | Status: AC
  Administered 2015-11-28: 1 via URETHRAL

## 2015-11-28 NOTE — Progress Notes (Signed)
11/28/2015 8:38 AM   Roger Rodriguez 1940/11/27 [id number]  Referring provider: [name]., MD [address] [place], [place] [address]  Chief Complaint  Patient presents with  . Cysto    microscopic hematuria    HPI: The patient is a 75 year old gentleman who was initially found with microscopic hematuria. Further evaluation revealed multiple large left distal ureteral stone as well as a smaller nonobstructing lower pole stone. These were recently extracted via the left ureteroscopy with Dr. Erlene Quan. The patient follows up today for left ureteral stent removal.   PMH: Past Medical History  Diagnosis Date  . Hyperlipidemia   . Inguinal hernia   . Arthritis     OA  . Allergy     allergic rhinitis  . Prostate cancer ([place])   . Parkinson's disease ([place])   . History of kidney stones   . Hypertension     no meds since parkinsons meds started  . Shortness of breath dyspnea     doe secondary to parkinsons  . Depression   . Chronic kidney disease     stones  . Neuromuscular disorder ([place])     parkinsons  . Overactive bladder   . Incontinence of urine   . Hematuria   . Rhinitis, allergic   . Family history of adverse reaction to anesthesia     Surgical History: Past Surgical History  Procedure Laterality Date  . Rotator cuff tear  1993    Repaired  . H/o arthroscopy of right knee  2015  . H/o radical protatectomy    . Vasectomy      History  . Back surgery  2014  . Ureteroscopy with holmium laser lithotripsy  2002?  [name] Lithotripsy  2010  . Eye surgery    . Hernia repair    . Fracture surgery       ankle   . Joint replacement      right knee  . Cystoscopy/ureteroscopy/holmium laser/stent placement Left 11/20/2015    Procedure: CYSTOSCOPY/URETEROSCOPY/HOLMIUM LASER/STENT PLACEMENT;  Surgeon: Hollice Espy, MD;  Location: [facility] ORS;  Service: Urology;  Laterality: Left;    Home Medications:    Medication List       This list is  accurate as of: 11/28/15  8:38 AM.  Always use your most recent med list.               carbidopa-levodopa 50-200 MG tablet  Commonly known as:  SINEMET CR  TAKE 2 TABLET BY MOUTH 3 (THREE) TIMES DAILY.     celecoxib 200 MG capsule  Commonly known as:  CELEBREX  TAKE 1 CAPSULE BY MOUTH TWICE DAILY     docusate sodium 100 MG capsule  Commonly known as:  COLACE  Take 1 capsule (100 mg total) by mouth 2 (two) times daily.     gabapentin 400 MG capsule  Commonly known as:  NEURONTIN  Take 400 mg by mouth 3 (three) times daily.     hydrochlorothiazide 12.5 MG tablet  Commonly known as:  HYDRODIURIL  Take 1 tablet (12.5 mg total) by mouth daily.     loratadine 10 MG tablet  Commonly known as:  CLARITIN  Take 10 mg by mouth daily.     MYRBETRIQ 25 MG Tb24 tablet  Generic drug:  mirabegron ER  Reported on 11/08/2015     oxyCODONE-acetaminophen 5-325 MG tablet  Commonly known as:  PERCOCET  Take 1-2 tablets by mouth every 4 (four) hours as needed for  moderate pain or severe pain.     RHINOCORT AQUA 32 MCG/ACT nasal spray  Generic drug:  budesonide  Place into the nose.     rOPINIRole 0.5 MG tablet  Commonly known as:  REQUIP  Take by mouth.     tamsulosin 0.4 MG Caps capsule  Commonly known as:  FLOMAX  Take 1 capsule (0.4 mg total) by mouth daily.     triamcinolone cream 0.1 %  Commonly known as:  KENALOG     vitamin C 500 MG tablet  Commonly known as:  ASCORBIC ACID  Take 500 mg by mouth daily.     Vitamin D 2000 units tablet  Take by mouth.     VITAMIN E NATURAL PO  Take by mouth.        Allergies: No Known Allergies  Family History: Family History  Problem Relation Age of Onset  . Dementia Mother   . Cerebral aneurysm Father   . Prostate cancer Neg Hx   . Bladder Cancer Paternal Uncle   . Kidney cancer Neg Hx   . Liver cancer Sister     Social History:  reports that he quit smoking about 37 years ago. His smoking use included Cigars. He quit  smokeless tobacco use about 12 years ago. His smokeless tobacco use included Chew. He reports that he drinks alcohol. He reports that he does not use illicit drugs.  ROS:                                        Physical Exam: BP 148/82 mmHg  Pulse 78  Ht 5\' 11"  (1.803 m)  Wt 208 lb (94.348 kg)  BMI 29.02 kg/m2  Constitutional:  Alert and oriented, No acute distress. HEENT: [place] AT, moist mucus membranes.  Trachea midline, no masses. Cardiovascular: No clubbing, cyanosis, or edema. Respiratory: Normal respiratory effort, no increased work of breathing. GI: Abdomen is soft, nontender, nondistended, no abdominal masses GU: No CVA tenderness.  Skin: No rashes, bruises or suspicious lesions. Lymph: No cervical or inguinal adenopathy. Neurologic: Grossly intact, no focal deficits, moving all 4 extremities. Psychiatric: Normal mood and affect.  Laboratory Data: Lab Results  Component Value Date   WBC 6.2 06/19/2014   HGB 14.4 06/19/2014   HCT 43 06/19/2014   MCV 95 04/03/2014   PLT 253 06/19/2014    Lab Results  Component Value Date   CREATININE 1.00 10/30/2015    Lab Results  Component Value Date   PSA <0.1 09/11/2010    No results found for: TESTOSTERONE  Lab Results  Component Value Date   HGBA1C 6.1* 10/02/2015    Urinalysis    Component Value Date/Time   COLORURINE Yellow 03/21/2014 1433   APPEARANCEUR Cloudy* 11/08/2015 1334   APPEARANCEUR Clear 03/21/2014 1433   LABSPEC 1.013 03/21/2014 1433   PHURINE 7.0 03/21/2014 1433   GLUCOSEU Negative 11/08/2015 1334   GLUCOSEU Negative 03/21/2014 1433   HGBUR Negative 03/21/2014 1433   BILIRUBINUR Negative 11/08/2015 1334   BILIRUBINUR Negative 03/21/2014 1433   KETONESUR Negative 03/21/2014 1433   PROTEINUR 2+* 11/08/2015 1334   PROTEINUR Negative 03/21/2014 1433   NITRITE Negative 11/08/2015 1334   NITRITE Negative 03/21/2014 1433   LEUKOCYTESUR Negative 11/08/2015 1334   LEUKOCYTESUR  Trace 03/21/2014 1433     Cystoscopy Procedure Note  Patient identification was confirmed, informed consent was obtained, and patient was prepped using Betadine  solution.  Lidocaine jelly was administered per urethral meatus.    Preoperative abx where received prior to procedure.     Pre-Procedure: - Inspection reveals a normal caliber ureteral meatus.  Procedure: The flexible cystoscope was introduced without difficulty - No urethral strictures/lesions are present.  Left ureteral stent was visualized and grasped flexible blast first. It was removed intact per urethral meatus.   Post-Procedure: - Patient tolerated the procedure well    Assessment & Plan:   1. Left ureteral calculi status post ureteroscopy -Will need renal ultrasound in one month to rule out iatrogenic hydronephrosis.  2. Right nonobstructing renal stone -will address at a later date  3. Prostate cancer Status post prostatectomy. No evidence of disease. PSA undetectable. Due for repeat PSA in 1 year.  4. Overactive bladder Continue Mybetriq 25 to 50 mg. Likely related to Parkinson's disease  5. Urinary incontinence Continue to pass  6. Microscopic hematuria -negative workup except for nephrolithiasis.   Return in about 4 weeks (around 12/26/2015) for w/ Dr. Erlene Quan with renal ultrasound just prior.  Nickie Retort, MD  [facility] [address], [place], [place] [address] [phone]

## 2015-12-02 NOTE — Telephone Encounter (Signed)
Order placed.  Roger Espy, MD

## 2015-12-20 ENCOUNTER — Ambulatory Visit
Admission: RE | Admit: 2015-12-20 | Discharge: 2015-12-20 | Disposition: A | Discharge status: Home / Self Care | Payer: PPO | Source: Ambulatory Visit | Attending: Urology | Admitting: Urology

## 2015-12-20 DIAGNOSIS — N281 Cyst of kidney, acquired: Secondary | ICD-10-CM | POA: Insufficient documentation

## 2015-12-20 DIAGNOSIS — N200 Calculus of kidney: Secondary | ICD-10-CM | POA: Insufficient documentation

## 2015-12-25 ENCOUNTER — Ambulatory Visit: Payer: Self-pay | Admitting: Urology

## 2015-12-31 ENCOUNTER — Ambulatory Visit (INDEPENDENT_AMBULATORY_CARE_PROVIDER_SITE_OTHER): Payer: PPO | Admitting: Urology

## 2015-12-31 ENCOUNTER — Telehealth: Payer: Self-pay | Admitting: Urology

## 2015-12-31 ENCOUNTER — Encounter: Payer: Self-pay | Admitting: Urology

## 2015-12-31 VITALS — BP 165/90 | HR 80 | Ht 71.0 in | Wt 210.0 lb

## 2015-12-31 DIAGNOSIS — N5231 Erectile dysfunction following radical prostatectomy: Secondary | ICD-10-CM

## 2015-12-31 DIAGNOSIS — N200 Calculus of kidney: Secondary | ICD-10-CM | POA: Diagnosis not present

## 2015-12-31 DIAGNOSIS — N3281 Overactive bladder: Secondary | ICD-10-CM

## 2015-12-31 DIAGNOSIS — C61 Malignant neoplasm of prostate: Secondary | ICD-10-CM | POA: Diagnosis not present

## 2015-12-31 NOTE — Telephone Encounter (Signed)
Patient was seen in the office for a cysto stent removal and was charged for an office visit in addition to this and his insurance charged him a $50.00 copay for that DOS can we see about having this removed.  Patient would like a call once we decide what we can do?  Thanks  [employer]

## 2015-12-31 NOTE — Patient Instructions (Signed)
Please get [facility] xray on the day of your next visit across the street from our clinic.    Hollice Espy, MD

## 2015-12-31 NOTE — Progress Notes (Signed)
3:08 PM  12/31/2015   Roger Rodriguez 12-26-1940 [id number]  Referring provider: [name]., MD [address] [place], [place] [address]  Chief Complaint  Patient presents with  . Results    4wk u/s results    HPI:  75 year old male who returns today with multiple bilateral nonobstructing nephrolithiasis as well as left distal ureteral calculus with obstruction discovered upon microscopic hematuria work up s/p L URS on 11/20/15. His stent was removed a week later.  He returns for routine follow up.     Prostate cancer/ SUI Patient has a personal history of prostate cancer and underwent open radical prostatectomy in March 2006 by Dr. Quillian Quince.  Pathology unknown.  NED.   Most recent PSA <0.1 on 10/02/15.   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    Bladder overactivity /urinary frequency Patient has significant urinary symptoms today including urinary frequency and SUI.  He does have trouble starting his stream and has to sit to void.   He is on Mybetriq 25 mg which helps.   He does have a history of Parkinson disease.  Hematuria Microsocpic hematuria times multiple. Status post CT urogram as above.  Cysto negative.    History of kidney stones He does have a personal history of kidney stones.  He has had several ESWL, last ~3 year go.  He does have chronic back pain so he is not sure if his discomfort is from his back or from stones.  He is not passed a stone recently.   CT urogram on 10/2015 shows evidence of left distal ureteral calculi, measuring up to 8 mm. He also has nonobstructing calculi bilaterally.  S/p L URS on [id number], uncomplicated.  All left sided stones treated.  Stent removed on 11/28/15 and f/u RUS negative for residual hydronephrosis.  Stone analysis Ca oxalate monohydrate 80%, Ca oxalate dihydrate 10%, Ca phosphate 5% and uric acid 5%.  He has been drinking lemonade.  He does not drink enough water.  He does not  add additional fat.    No dysuria, fevers, or pain.  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  He has tried Viagra along with other PDE 5 inhibitors not affected. He has previously tried alprostadil injections with fairly decent success although finds these to be too expensive. He is interested in pursuing intracavernosal injection therapy in the form of Trymex.  PMH: Past Medical History  Diagnosis Date  . Hyperlipidemia   . Inguinal hernia   . Arthritis     OA  . Allergy     allergic rhinitis  . Prostate cancer ([place])   . Parkinson's disease ([place])   . History of kidney stones   . Hypertension     no meds since parkinsons meds started  . Shortness of breath dyspnea     doe secondary to parkinsons  . Depression   . Chronic kidney disease     stones  . Neuromuscular disorder ([place])     parkinsons  . Overactive bladder   . Incontinence of urine   . Hematuria   . Rhinitis, allergic   . Family history of adverse reaction to anesthesia     Surgical History: Past Surgical History  Procedure Laterality Date  . Rotator cuff tear  1993    Repaired  . H/o arthroscopy of right knee  2015  . H/o radical protatectomy    . Vasectomy      History  .  Back surgery  2014  . Ureteroscopy with holmium laser lithotripsy  2002?  [name] Lithotripsy  2010  . Eye surgery    . Hernia repair    . Fracture surgery       ankle   . Joint replacement      right knee  . Cystoscopy/ureteroscopy/holmium laser/stent placement Left 11/20/2015    Procedure: CYSTOSCOPY/URETEROSCOPY/HOLMIUM LASER/STENT PLACEMENT;  Surgeon: Hollice Espy, MD;  Location: [facility] ORS;  Service: Urology;  Laterality: Left;    Home Medications:    Medication List       This list is accurate as of: 12/31/15  3:08 PM.  Always use your most recent med list.               carbidopa-levodopa 50-200 MG tablet  Commonly known as:  SINEMET CR  TAKE 2 TABLET BY MOUTH 3 (THREE) TIMES DAILY.     celecoxib 200 MG capsule    Commonly known as:  CELEBREX  TAKE 1 CAPSULE BY MOUTH TWICE DAILY     docusate sodium 100 MG capsule  Commonly known as:  COLACE  Take 1 capsule (100 mg total) by mouth 2 (two) times daily.     gabapentin 400 MG capsule  Commonly known as:  NEURONTIN  Take 400 mg by mouth 3 (three) times daily.     hydrochlorothiazide 12.5 MG tablet  Commonly known as:  HYDRODIURIL  Take 1 tablet (12.5 mg total) by mouth daily.     loratadine 10 MG tablet  Commonly known as:  CLARITIN  Take 10 mg by mouth daily.     MYRBETRIQ 25 MG Tb24 tablet  Generic drug:  mirabegron ER  Reported on 11/08/2015     RHINOCORT AQUA 32 MCG/ACT nasal spray  Generic drug:  budesonide  Place into the nose.     rOPINIRole 0.5 MG tablet  Commonly known as:  REQUIP  Take by mouth.     tamsulosin 0.4 MG Caps capsule  Commonly known as:  FLOMAX  Take 1 capsule (0.4 mg total) by mouth daily.     triamcinolone cream 0.1 %  Commonly known as:  KENALOG     vitamin C 500 MG tablet  Commonly known as:  ASCORBIC ACID  Take 500 mg by mouth daily.     Vitamin D 2000 units tablet  Take by mouth.     VITAMIN E NATURAL PO  Take by mouth.        Allergies: No Known Allergies  Family History: Family History  Problem Relation Age of Onset  . Dementia Mother   . Cerebral aneurysm Father   . Prostate cancer Neg Hx   . Bladder Cancer Paternal Uncle   . Kidney cancer Neg Hx   . Liver cancer Sister     Social History:  reports that he quit smoking about 37 years ago. His smoking use included Cigars. He quit smokeless tobacco use about 12 years ago. His smokeless tobacco use included Chew. He reports that he drinks alcohol. He reports that he does not use illicit drugs.  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 His wife passed away this fall of Parkinson's disease/dementia.  Physical Exam: Ht 5\' 11"  (1.803 m)  Wt 210 lb (95.255 kg)  BMI 29.30 kg/m2  Constitutional:  Alert and  oriented, No acute distress. HEENT: [place] AT, moist mucus membranes.  Trachea midline, no masses. Cardiovascular: No clubbing, cyanosis, or edema.  Respiratory: Normal respiratory effort, no increased work of  breathing. GI: Abdomen is soft, nontender, nondistended, no abdominal masses GU: No CVA tenderness.  Skin: No rashes, bruises or suspicious lesions. Neurologic: Grossly intact, no focal deficits, moving all 4 extremities.  Mild resting tremor noted. Ambulates with cane. Psychiatric: Normal mood and affect.  Laboratory Data: Lab Results  Component Value Date   WBC 6.2 06/19/2014   HGB 14.4 06/19/2014   HCT 43 06/19/2014   MCV 95 04/03/2014   PLT 253 06/19/2014    Lab Results  Component Value Date   CREATININE 1.00 10/30/2015    PSA as above  Lab Results  Component Value Date   HGBA1C 6.1* 10/02/2015    Urinalysis Results for orders placed or performed in visit on 11/28/15  Microscopic Examination  Result Value Ref Range   WBC, UA None seen 0 -  5 /hpf   RBC, UA >30R 0 -  2 /hpf   Epithelial Cells (non renal) None seen 0 - 10 /hpf   Bacteria, UA Few None seen/Few   Urinalysis Comments Comment   Urinalysis, Complete  Result Value Ref Range   Specific Gravity, UA 1.030 1.005 - 1.030   pH, UA 5.5 5.0 - 7.5   Color, UA Red (A) Yellow   Appearance Ur Cloudy (A) Clear   Leukocytes, UA Trace (A) Negative   Protein, UA 3+ (A) Negative/Trace   Glucose, UA Negative Negative   Ketones, UA Negative Negative   RBC, UA 3+ (A) Negative   Bilirubin, UA Negative Negative   Urobilinogen, Ur 0.2 0.2 - 1.0 mg/dL   Nitrite, UA Negative Negative   Microscopic Examination See below:     Pertinent Imaging:    Study Result     CLINICAL DATA: Known renal stones.  EXAM: RENAL / URINARY TRACT ULTRASOUND COMPLETE  COMPARISON: CT scan October 30, 2015  FINDINGS: Right Kidney:  Length: 14.2 cm. Multiple cysts with the largest measuring 7.3 cm. No stones or  hydronephrosis.  Left Kidney:  Length: 13.9 cm. Multiple cysts with the largest measuring 2.6 cm. No stones or hydronephrosis.  Bladder:  Appears normal for degree of bladder distention.  IMPRESSION: Renal cysts. No stones or hydronephrosis identified.   Electronically Signed  By: Dorise Bullion III M.D  On: 12/20/2015 15:03      Assessment & Plan:    1. Bilateral nephrolithiasis with left ureteral calculi/ hydronephrosis S/p successful L URS, cleared of left sided stone burden No residual hydronephrosis on f/u RUS Flank pain has resolved Reviewed stone analysis and stone prevention techniques today  1. Malignant neoplasm of prostate [facility]) S/p prostatectomy 2006, NED.  PSA undetectable.  Recommend annual PSA. - Urinalysis, Complete  2. OAB (overactive bladder) Continue  Mybetriq 25mg , doing well  3. Urinary incontinence, stress, male Wears depends.  4. Microscopic hematuria S/p negative hematuria work in 2017, stones treated  5. ED He is interested in trimix, compounded formulation. Script called [facility], patient advised to pick up her medication and bring to the office for injection teaching in dosing trial.   Return in about 4 weeks (around 01/28/2016) for injection teaching and also schedule f/u in 6 months with [facility].   Hollice Espy, MD  [facility] [address], [place],  [address] [phone]

## 2016-01-02 ENCOUNTER — Encounter: Payer: Self-pay | Admitting: Urology

## 2016-01-03 ENCOUNTER — Encounter: Payer: Self-pay | Admitting: Urology

## 2016-01-03 ENCOUNTER — Ambulatory Visit (INDEPENDENT_AMBULATORY_CARE_PROVIDER_SITE_OTHER): Payer: PPO | Admitting: Urology

## 2016-01-03 ENCOUNTER — Telehealth: Payer: Self-pay | Admitting: Radiology

## 2016-01-03 ENCOUNTER — Ambulatory Visit
Admission: RE | Admit: 2016-01-03 | Discharge: 2016-01-03 | Disposition: A | Discharge status: Home / Self Care | Payer: PPO | Source: Ambulatory Visit | Attending: Urology | Admitting: Urology

## 2016-01-03 VITALS — BP 154/77 | HR 86 | Ht 71.0 in | Wt 210.6 lb

## 2016-01-03 DIAGNOSIS — R350 Frequency of micturition: Secondary | ICD-10-CM | POA: Diagnosis not present

## 2016-01-03 DIAGNOSIS — N200 Calculus of kidney: Secondary | ICD-10-CM | POA: Insufficient documentation

## 2016-01-03 DIAGNOSIS — R109 Unspecified abdominal pain: Secondary | ICD-10-CM | POA: Diagnosis not present

## 2016-01-03 LAB — URINALYSIS, COMPLETE
Bilirubin, UA: NEGATIVE
Glucose, UA: NEGATIVE
Nitrite, UA: NEGATIVE
Specific Gravity, UA: 1.015 (ref 1.005–1.030)
Urobilinogen, Ur: 1 mg/dL (ref 0.2–1.0)
pH, UA: 5.5 (ref 5.0–7.5)

## 2016-01-03 LAB — MICROSCOPIC EXAMINATION
Epithelial Cells (non renal): NONE SEEN /hpf (ref 0–10)
RBC, UA: >30 /hpf — ABNORMAL HIGH (ref 0–?)

## 2016-01-03 MED ORDER — HYDROCODONE-ACETAMINOPHEN 5-325 MG PO TABS
1.0000 | ORAL_TABLET | Freq: Four times a day (QID) | ORAL | Status: DC | PRN
Start: 2016-01-03 — End: 2016-01-13

## 2016-01-03 NOTE — Progress Notes (Signed)
01/03/2016 10:16 AM   Roger Rodriguez 05-05-1941 [id number]  Referring provider: [name]., MD [address] [place], [place] [address]  Chief Complaint  Patient presents with  . Right Flank Pain    HPI:  75 year old male with multiple urologic issues who was seen in the office just 3 days ago now complaining of new onset right flank pain for the past 24 hours. He describes this as severe. He decreased passing a stone on this side. He recently underwent left ureteroscopy, laser lithotripsy on 11/20/2015 for a left distal ureteral stone.  No fevers or chills. No nausea or vomiting.  He does have baseline urinary frequency, trouble starting his stream, and sits to void. He is on Mybetriq 25 mg. His voiding symptoms are unchanged. No dysuria or gross hematuria.  Stat KUB ordered today shows no obvious ureteral stone on the right but he does have a history of nonobstructing right-sided stones, the largest of which is a 7 mm upper pole stone.   PMH: Past Medical History  Diagnosis Date  . Hyperlipidemia   . Inguinal hernia   . Arthritis     OA  . Allergy     allergic rhinitis  . Prostate cancer ([place])   . Parkinson's disease ([place])   . History of kidney stones   . Hypertension     no meds since parkinsons meds started  . Shortness of breath dyspnea     doe secondary to parkinsons  . Depression   . Chronic kidney disease     stones  . Neuromuscular disorder ([place])     parkinsons  . Overactive bladder   . Incontinence of urine   . Hematuria   . Rhinitis, allergic   . Family history of adverse reaction to anesthesia     Surgical History: Past Surgical History  Procedure Laterality Date  . Rotator cuff tear  1993    Repaired  . H/o arthroscopy of right knee  2015  . H/o radical protatectomy    . Vasectomy      History  . Back surgery  2014  . Ureteroscopy with holmium laser lithotripsy  2002?  [name] Lithotripsy  2010  . Eye surgery    . Hernia  repair    . Fracture surgery       ankle   . Joint replacement      right knee  . Cystoscopy/ureteroscopy/holmium laser/stent placement Left 11/20/2015    Procedure: CYSTOSCOPY/URETEROSCOPY/HOLMIUM LASER/STENT PLACEMENT;  Surgeon: Hollice Espy, MD;  Location: [facility] ORS;  Service: Urology;  Laterality: Left;    Home Medications:    Medication List       This list is accurate as of: 01/03/16 11:59 PM.  Always use your most recent med list.               carbidopa-levodopa 50-200 MG tablet  Commonly known as:  SINEMET CR  TAKE 2 TABLET BY MOUTH 3 (THREE) TIMES DAILY.     celecoxib 200 MG capsule  Commonly known as:  CELEBREX  TAKE 1 CAPSULE BY MOUTH TWICE DAILY     docusate sodium 100 MG capsule  Commonly known as:  COLACE  Take 1 capsule (100 mg total) by mouth 2 (two) times daily.     erythromycin ophthalmic ointment  Place 1 application into both eyes at bedtime.     gabapentin 400 MG capsule  Commonly known as:  NEURONTIN  Take 400 mg by mouth 3 (three) times  daily.     hydrochlorothiazide 12.5 MG tablet  Commonly known as:  HYDRODIURIL  Take 1 tablet (12.5 mg total) by mouth daily.     HYDROcodone-acetaminophen 5-325 MG tablet  Commonly known as:  NORCO/VICODIN  Take 1-2 tablets by mouth every 6 (six) hours as needed for moderate pain.     loratadine 10 MG tablet  Commonly known as:  CLARITIN  Take 10 mg by mouth daily.     MYRBETRIQ 25 MG Tb24 tablet  Generic drug:  mirabegron ER  Take 25 mg by mouth every morning.     MYRBETRIQ 25 MG Tb24 tablet  Generic drug:  mirabegron ER  Reported on 11/08/2015     RHINOCORT AQUA 32 MCG/ACT nasal spray  Generic drug:  budesonide  Place 1 spray into the nose daily. Reported on 01/03/2016     rOPINIRole 0.5 MG tablet  Commonly known as:  REQUIP  Take 0.5 mg by mouth at bedtime.     triamcinolone cream 0.1 %  Commonly known as:  KENALOG  Reported on 01/03/2016     vitamin C 500 MG tablet  Commonly known as:   ASCORBIC ACID  Take 500 mg by mouth daily.     Vitamin D 2000 units tablet  Take 2,000 Units by mouth daily.     VITAMIN E NATURAL PO  Take 1 capsule by mouth daily.        Allergies: No Known Allergies  Family History: Family History  Problem Relation Age of Onset  . Dementia Mother   . Cerebral aneurysm Father   . Prostate cancer Neg Hx   . Bladder Cancer Paternal Uncle   . Kidney cancer Neg Hx   . Liver cancer Sister     Social History:  reports that he quit smoking about 37 years ago. His smoking use included Cigars. He quit smokeless tobacco use about 12 years ago. His smokeless tobacco use included Chew. He reports that he drinks alcohol. He reports that he does not use illicit drugs.  ROS: UROLOGY Frequent Urination?: Yes Hard to postpone urination?: Yes Burning/pain with urination?: No Get up at night to urinate?: Yes Leakage of urine?: Yes Urine stream starts and stops?: Yes Trouble starting stream?: Yes Do you have to strain to urinate?: Yes Blood in urine?: No Urinary tract infection?: No Sexually transmitted disease?: No Injury to kidneys or bladder?: No Painful intercourse?: No Weak stream?: Yes Erection problems?: No Penile pain?: No  Gastrointestinal Nausea?: No Vomiting?: No Indigestion/heartburn?: No Diarrhea?: No Constipation?: No  Constitutional Fever: No Night sweats?: No Weight loss?: No Fatigue?: No  Skin Skin rash/lesions?: No Itching?: No  Eyes Blurred vision?: No Double vision?: No  Ears/Nose/Throat Sore throat?: No Sinus problems?: No  Hematologic/Lymphatic Swollen glands?: No Easy bruising?: No  Cardiovascular Leg swelling?: No Chest pain?: No  Respiratory Cough?: No Shortness of breath?: No  Endocrine Excessive thirst?: No  Musculoskeletal Back pain?: Yes Joint pain?: No  Neurological Headaches?: No Dizziness?: No  Psychologic Depression?: No Anxiety?: No  Physical Exam: BP 154/77 mmHg   Pulse 86  Ht 5\' 11"  (1.803 m)  Wt 210 lb 9.6 oz (95.528 kg)  BMI 29.39 kg/m2  Constitutional:  Alert and oriented, No acute distress.   HEENT: [place] AT, moist mucus membranes.  Trachea midline, no masses. Cardiovascular: No clubbing, cyanosis, or edema. RRR. Respiratory: Normal respiratory effort, no increased work of breathing. CTAB. GI: Abdomen is soft, nontender, nondistended, no abdominal masses GU: Mild right CVA tenderness. Skin:  No rashes, bruises or suspicious lesions. Neurologic: Grossly intact, no focal deficits, moving all 4 extremities.  Resting tremor noted. Psychiatric: Normal mood. Flat affect.  Laboratory Data: Lab Results  Component Value Date   WBC 6.2 06/19/2014   HGB 14.4 06/19/2014   HCT 43 06/19/2014   MCV 95 04/03/2014   PLT 253 06/19/2014    Lab Results  Component Value Date   CREATININE 1.02 01/07/2016    Lab Results  Component Value Date   PSA <0.1 09/11/2010    No results found for: TESTOSTERONE  Lab Results  Component Value Date   HGBA1C 6.1* 10/02/2015    Urinalysis Results for orders placed or performed in visit on 01/03/16  CULTURE, URINE COMPREHENSIVE  Result Value Ref Range   Urine Culture, Comprehensive Final report (A)    Result 1 Escherichia coli (A)    ANTIMICROBIAL SUSCEPTIBILITY Comment   Microscopic Examination  Result Value Ref Range   WBC, UA 0-5 0 -  5 /hpf   RBC, UA >30 (H) 0 -  2 /hpf   Epithelial Cells (non renal) None seen 0 - 10 /hpf   Crystals Present (A) N/A   Crystal Type Amorphous Sediment N/A   Mucus, UA Present (A) Not Estab.   Bacteria, UA Moderate (A) None seen/Few  Urinalysis, Complete  Result Value Ref Range   Specific Gravity, UA 1.015 1.005 - 1.030   pH, UA 5.5 5.0 - 7.5   Color, UA Yellow Yellow   Appearance Ur Cloudy (A) Clear   Leukocytes, UA Trace (A) Negative   Protein, UA 1+ (A) Negative/Trace   Glucose, UA Negative Negative   Ketones, UA Trace (A) Negative   RBC, UA 3+ (A) Negative    Bilirubin, UA Negative Negative   Urobilinogen, Ur 1.0 0.2 - 1.0 mg/dL   Nitrite, UA Negative Negative   Microscopic Examination See below:     Pertinent Imaging:  CLINICAL DATA: Right-sided nephrolithiasis.  EXAM: ABDOMEN - 1 VIEW  COMPARISON: CT scan 10/30/2015  FINDINGS: Possible tiny stones seen over the lower pole right kidney. No definite stones overlying the expected location of the left kidney. Multiple phleboliths and surgical clips are noted in the lower anatomic pelvis. Patient is status post laminectomy at L3 through L5. Bowel gas pattern is within normal limits.  IMPRESSION: Question small right lower pole renal stone.   Electronically Signed  By: Misty Stanley M.D.  On: 01/03/2016 14:44      Assessment & Plan:    1. Flank pain Acute onset right flank pain consistent with his history of stones. No obvious ureteral stones on KUB today. He does have several nonobstructing right-sided stones measuring up to 7 mm. He would be interested in treating the stones on this side regardless of whether or not he is actively passing a stone. He has no signs or symptoms of infection today.  We discussed various treatment options including ESWL vs. ureteroscopy, laser lithotripsy, and stent. We discussed the risks and benefits of both including bleeding, infection, damage to surrounding structures, efficacy with need for possible further intervention, and need for temporary ureteral stent.  He is interested in proceeding with right ureteroscopy. All his questions were answered.  He was informed to present to the emergency room if he develops any fevers, chills, or any other concerning signs or pain that is unable to be controlled in the interim. - Urinalysis, Complete - CULTURE, URINE COMPREHENSIVE  2. Renal stone As above  3. Urinary frequency Baseline, on  mybetriq - Urinalysis, Complete   Hollice Espy, MD  [facility] [address], [place], [place] [address] [phone]

## 2016-01-03 NOTE — Telephone Encounter (Signed)
Notified pt of surgery scheduled 01/13/16, pre-admit testing phone interview on 01/06/16 between 1-5pm & to call Friday prior to surgery for arrival time to SDS. Pt voices understanding.

## 2016-01-03 NOTE — Telephone Encounter (Signed)
Pt c/o right flank pain. Was seen by Dr Erlene Quan on 12/31/15 s/p successful left URS. Please advise. May return call at [phone].

## 2016-01-06 ENCOUNTER — Other Ambulatory Visit: Payer: No Typology Code available for payment source

## 2016-01-06 ENCOUNTER — Telehealth: Payer: Self-pay

## 2016-01-06 DIAGNOSIS — N390 Urinary tract infection, site not specified: Secondary | ICD-10-CM

## 2016-01-06 LAB — CULTURE, URINE COMPREHENSIVE

## 2016-01-06 MED ORDER — AMOXICILLIN-POT CLAVULANATE 875-125 MG PO TABS: 1.0000 | ORAL_TABLET | Freq: Two times a day (BID) | ORAL | Status: DC

## 2016-01-06 NOTE — Patient Instructions (Addendum)
  Your procedure is scheduled on: 01-13-16 [facility]) Report to Same Day Surgery 2nd floor medical mall To find out your arrival time please call [phone] between [place] on 01-10-16 (FRIDAY)  Remember: Instructions that are not followed completely may result in serious medical risk, up to and including death, or upon the discretion of your surgeon and anesthesiologist your surgery may need to be rescheduled.    _x___ 1. Do not eat food or drink liquids after midnight. No gum chewing or hard candies.     __x__ 2. No Alcohol for 24 hours before or after surgery.   __x__3. No Smoking for 24 prior to surgery.   ____  4. Bring all medications with you on the day of surgery if instructed.    __x__ 5. Notify your doctor if there is any change in your medical condition     (cold, fever, infections).     Do not wear jewelry, make-up, hairpins, clips or nail polish.  Do not wear lotions, powders, or perfumes. You may wear deodorant.  Do not shave 48 hours prior to surgery. Men may shave face and neck.  Do not bring valuables to the hospital.    [facility] is not responsible for any belongings or valuables.               Contacts, dentures or bridgework may not be worn into surgery.  Leave your suitcase in the car. After surgery it may be brought to your room.  For patients admitted to the hospital, discharge time is determined by your treatment team.   Patients discharged the day of surgery will not be allowed to drive home.    Please read over the following fact sheets that you were given:   [facility] Preparing for Surgery and or MRSA Information   _x___ Take these medicines the morning of surgery with A SIP OF WATER:    1. SINEMET (CARBIDOPA-LEVODOPA)  2. GABAPENTIN  3. MYRBETRIQ  4.  5.  6.  ____ Fleet Enema (as directed)   ____ Use CHG Soap or sage wipes as directed on instruction sheet   ____ Use inhalers on the day of surgery and bring to hospital day of  surgery  ____ Stop metformin 2 days prior to surgery    ____ Take 1/2 of usual insulin dose the night before surgery and none on the morning of surgery.   ____ Stop aspirin or coumadin, or plavix  _x__ Stop Anti-inflammatories such as Advil, Aleve, Ibuprofen, Motrin, Naproxen,          Naprosyn, Goodies powders or aspirin products. Ok to take Hydrocodone if needed   _X___ Stop supplements until after surgery-STOP VITAMIN C AND E NOW  ____ Bring C-Pap to the hospital.

## 2016-01-06 NOTE — Telephone Encounter (Signed)
Patient notified of results and script sent to pharmacy/SW

## 2016-01-06 NOTE — Telephone Encounter (Signed)
-----   Message from Nori Riis, PA-C sent at 01/06/2016  1:26 PM EDT ----- Rx for Augmentin 875/125, one tablet twice daily for seven days.  URS on the 3rd.

## 2016-01-07 ENCOUNTER — Encounter
Admission: RE | Admit: 2016-01-07 | Discharge: 2016-01-07 | Disposition: A | Discharge status: Home / Self Care | Payer: PPO | Source: Ambulatory Visit | Attending: Urology | Admitting: Urology

## 2016-01-07 DIAGNOSIS — Z01812 Encounter for preprocedural laboratory examination: Secondary | ICD-10-CM | POA: Insufficient documentation

## 2016-01-07 LAB — BASIC METABOLIC PANEL
Anion gap: 9 (ref 5–15)
BUN: 20 mg/dL (ref 6–20)
CO2: 27 mmol/L (ref 22–32)
Calcium: 9.2 mg/dL (ref 8.9–10.3)
Chloride: 102 mmol/L (ref 101–111)
Creatinine, Ser: 1.02 mg/dL (ref 0.61–1.24)
GFR calc Af Amer: >60 mL/min (ref >60)
GFR calc non Af Amer: >60 mL/min (ref >60)
Glucose, Bld: 83 mg/dL (ref 65–99)
Potassium: 3.9 mmol/L (ref 3.5–5.1)
Sodium: 138 mmol/L (ref 135–145)

## 2016-01-10 ENCOUNTER — Encounter: Payer: Self-pay | Admitting: Urology

## 2016-01-13 ENCOUNTER — Telehealth: Payer: Self-pay | Admitting: Radiology

## 2016-01-13 ENCOUNTER — Ambulatory Visit: Payer: PPO | Admitting: Anesthesiology

## 2016-01-13 ENCOUNTER — Ambulatory Visit
Admission: RE | Admit: 2016-01-13 | Discharge: 2016-01-13 | Disposition: A | Discharge status: Home / Self Care | Payer: PPO | Source: Ambulatory Visit | Attending: Urology | Admitting: Urology

## 2016-01-13 ENCOUNTER — Encounter
Admission: RE | Disposition: A | Discharge status: Home / Self Care | Payer: Self-pay | Source: Ambulatory Visit | Attending: Urology

## 2016-01-13 ENCOUNTER — Encounter: Payer: Self-pay | Admitting: *Deleted

## 2016-01-13 DIAGNOSIS — N3281 Overactive bladder: Secondary | ICD-10-CM | POA: Insufficient documentation

## 2016-01-13 DIAGNOSIS — R350 Frequency of micturition: Secondary | ICD-10-CM | POA: Insufficient documentation

## 2016-01-13 DIAGNOSIS — F329 Major depressive disorder, single episode, unspecified: Secondary | ICD-10-CM | POA: Diagnosis not present

## 2016-01-13 DIAGNOSIS — Z8546 Personal history of malignant neoplasm of prostate: Secondary | ICD-10-CM | POA: Insufficient documentation

## 2016-01-13 DIAGNOSIS — M1990 Unspecified osteoarthritis, unspecified site: Secondary | ICD-10-CM | POA: Diagnosis not present

## 2016-01-13 DIAGNOSIS — Z9889 Other specified postprocedural states: Secondary | ICD-10-CM | POA: Insufficient documentation

## 2016-01-13 DIAGNOSIS — G20 Parkinson's disease: Secondary | ICD-10-CM | POA: Diagnosis not present

## 2016-01-13 DIAGNOSIS — E785 Hyperlipidemia, unspecified: Secondary | ICD-10-CM | POA: Diagnosis not present

## 2016-01-13 DIAGNOSIS — Z87442 Personal history of urinary calculi: Secondary | ICD-10-CM | POA: Insufficient documentation

## 2016-01-13 DIAGNOSIS — I129 Hypertensive chronic kidney disease with stage 1 through stage 4 chronic kidney disease, or unspecified chronic kidney disease: Secondary | ICD-10-CM | POA: Diagnosis not present

## 2016-01-13 DIAGNOSIS — Z9079 Acquired absence of other genital organ(s): Secondary | ICD-10-CM | POA: Insufficient documentation

## 2016-01-13 DIAGNOSIS — Z79899 Other long term (current) drug therapy: Secondary | ICD-10-CM | POA: Insufficient documentation

## 2016-01-13 DIAGNOSIS — R0600 Dyspnea, unspecified: Secondary | ICD-10-CM | POA: Insufficient documentation

## 2016-01-13 DIAGNOSIS — N201 Calculus of ureter: Secondary | ICD-10-CM

## 2016-01-13 DIAGNOSIS — Z79891 Long term (current) use of opiate analgesic: Secondary | ICD-10-CM | POA: Diagnosis not present

## 2016-01-13 DIAGNOSIS — J309 Allergic rhinitis, unspecified: Secondary | ICD-10-CM | POA: Diagnosis not present

## 2016-01-13 DIAGNOSIS — Z87891 Personal history of nicotine dependence: Secondary | ICD-10-CM | POA: Insufficient documentation

## 2016-01-13 DIAGNOSIS — Z791 Long term (current) use of non-steroidal anti-inflammatories (NSAID): Secondary | ICD-10-CM | POA: Insufficient documentation

## 2016-01-13 DIAGNOSIS — N189 Chronic kidney disease, unspecified: Secondary | ICD-10-CM | POA: Insufficient documentation

## 2016-01-13 DIAGNOSIS — N200 Calculus of kidney: Secondary | ICD-10-CM | POA: Diagnosis not present

## 2016-01-13 DIAGNOSIS — Z7951 Long term (current) use of inhaled steroids: Secondary | ICD-10-CM | POA: Insufficient documentation

## 2016-01-13 DIAGNOSIS — N132 Hydronephrosis with renal and ureteral calculous obstruction: Secondary | ICD-10-CM | POA: Diagnosis not present

## 2016-01-13 HISTORY — PX: CYSTOSCOPY WITH STENT PLACEMENT: SHX5790

## 2016-01-13 HISTORY — PX: URETEROSCOPY WITH HOLMIUM LASER LITHOTRIPSY: SHX6645

## 2016-01-13 SURGERY — URETEROSCOPY, WITH LITHOTRIPSY USING HOLMIUM LASER
Anesthesia: General | Laterality: Right | Wound class: Clean Contaminated

## 2016-01-13 MED ORDER — ROCURONIUM BROMIDE 100 MG/10ML IV SOLN
INTRAVENOUS | Status: DC | PRN
  Administered 2016-01-13: 20 mg via INTRAVENOUS

## 2016-01-13 MED ORDER — SUCCINYLCHOLINE CHLORIDE 20 MG/ML IJ SOLN
INTRAMUSCULAR | Status: DC | PRN
  Administered 2016-01-13: 120 mg via INTRAVENOUS

## 2016-01-13 MED ORDER — SUGAMMADEX SODIUM 200 MG/2ML IV SOLN
INTRAVENOUS | Status: DC | PRN
  Administered 2016-01-13: 200 mg via INTRAVENOUS

## 2016-01-13 MED ORDER — CEFAZOLIN SODIUM-DEXTROSE 2-4 GM/100ML-% IV SOLN
INTRAVENOUS | Status: AC
  Administered 2016-01-13: 2 g via INTRAVENOUS
  Filled 2016-01-13: qty 100

## 2016-01-13 MED ORDER — FENTANYL CITRATE (PF) 100 MCG/2ML IJ SOLN
INTRAMUSCULAR | Status: DC | PRN
  Administered 2016-01-13: 100 ug via INTRAVENOUS

## 2016-01-13 MED ORDER — EPHEDRINE SULFATE 50 MG/ML IJ SOLN
INTRAMUSCULAR | Status: DC | PRN
  Administered 2016-01-13: 5 mg via INTRAVENOUS
  Administered 2016-01-13: 10 mg via INTRAVENOUS

## 2016-01-13 MED ORDER — FENTANYL CITRATE (PF) 100 MCG/2ML IJ SOLN: 25.0000 ug | INTRAMUSCULAR | Status: DC | PRN

## 2016-01-13 MED ORDER — LACTATED RINGERS IV SOLN
INTRAVENOUS | Status: DC
  Administered 2016-01-13: 11:00:00 via INTRAVENOUS

## 2016-01-13 MED ORDER — DEXAMETHASONE SODIUM PHOSPHATE 10 MG/ML IJ SOLN
INTRAMUSCULAR | Status: DC | PRN
  Administered 2016-01-13: 10 mg via INTRAVENOUS

## 2016-01-13 MED ORDER — ONDANSETRON HCL 4 MG/2ML IJ SOLN
INTRAMUSCULAR | Status: DC | PRN
Start: 2016-01-13 — End: 2016-01-13
  Administered 2016-01-13: 4 mg via INTRAVENOUS

## 2016-01-13 MED ORDER — ONDANSETRON HCL 4 MG/2ML IJ SOLN: 4.0000 mg | Freq: Once | INTRAMUSCULAR | Status: DC | PRN

## 2016-01-13 MED ORDER — HYDROCODONE-ACETAMINOPHEN 5-325 MG PO TABS: 1.0000 | ORAL_TABLET | Freq: Four times a day (QID) | ORAL | Status: DC | PRN

## 2016-01-13 MED ORDER — PROPOFOL 10 MG/ML IV BOLUS
INTRAVENOUS | Status: DC | PRN
  Administered 2016-01-13: 130 mg via INTRAVENOUS

## 2016-01-13 MED ORDER — CEFAZOLIN SODIUM-DEXTROSE 2-4 GM/100ML-% IV SOLN
2.0000 g | Freq: Once | INTRAVENOUS | Status: AC
  Administered 2016-01-13: 2 g via INTRAVENOUS

## 2016-01-13 MED ORDER — IOTHALAMATE MEGLUMINE 43 % IV SOLN
INTRAVENOUS | Status: DC | PRN
  Administered 2016-01-13: 30 mL

## 2016-01-13 MED ORDER — FAMOTIDINE 20 MG PO TABS
ORAL_TABLET | ORAL | Status: AC
  Administered 2016-01-13: 20 mg via ORAL
  Filled 2016-01-13: qty 1

## 2016-01-13 MED ORDER — FAMOTIDINE 20 MG PO TABS
20.0000 mg | ORAL_TABLET | Freq: Once | ORAL | Status: AC
  Administered 2016-01-13: 20 mg via ORAL

## 2016-01-13 SURGICAL SUPPLY — 32 items
ADAPTER SCOPE UROLOK II (MISCELLANEOUS) IMPLANT
BAG DRAIN CYSTO-URO LG1000N (MISCELLANEOUS) ×2 IMPLANT
BARD PROFLEX 273 LASER FIBER ×2 IMPLANT
BASKET ZERO TIP 1.9FR (BASKET) ×2 IMPLANT
CATH FOL 2WAY LX 16X5 (CATHETERS) IMPLANT
CATH URETL 5X70 OPEN END (CATHETERS) ×2 IMPLANT
CNTNR SPEC 2.5X3XGRAD LEK (MISCELLANEOUS) ×1
CONRAY 43 FOR UROLOGY 50M (MISCELLANEOUS) ×2 IMPLANT
CONT SPEC 4OZ STER OR WHT (MISCELLANEOUS) ×1
CONTAINER SPEC 2.5X3XGRAD LEK (MISCELLANEOUS) ×1 IMPLANT
DRAPE UTILITY 15X26 TOWEL STRL (DRAPES) ×2 IMPLANT
FEE TECHNICIAN ONLY PER HOUR (MISCELLANEOUS) IMPLANT
FIBER LASER LITHO 273 (Laser) ×2 IMPLANT
GLOVE BIO SURGEON STRL SZ 6.5 (GLOVE) ×2 IMPLANT
GOWN STRL REUS W/ TWL LRG LVL4 (GOWN DISPOSABLE) ×2 IMPLANT
GOWN STRL REUS W/TWL LRG LVL4 (GOWN DISPOSABLE) ×2
HOLDER FOLEY CATH W/STRAP (MISCELLANEOUS) IMPLANT
INTRODUCER DILATOR DOUBLE (INTRODUCER) IMPLANT
KIT RM TURNOVER CYSTO AR (KITS) ×2 IMPLANT
PACK CYSTO AR (MISCELLANEOUS) ×2 IMPLANT
PUMP SINGLE ACTION SAP (PUMP) IMPLANT
SENSORWIRE 0.038 NOT ANGLED (WIRE) ×2
SET CYSTO W/LG BORE CLAMP LF (SET/KITS/TRAYS/PACK) ×2 IMPLANT
SHEATH URETERAL 12FRX35CM (MISCELLANEOUS) IMPLANT
SOL .9 NS 3000ML IRR  AL (IV SOLUTION) ×1
SOL .9 NS 3000ML IRR UROMATIC (IV SOLUTION) ×1 IMPLANT
STENT URET 6FRX24 CONTOUR (STENTS) IMPLANT
STENT URET 6FRX26 CONTOUR (STENTS) IMPLANT
SURGILUBE 2OZ TUBE FLIPTOP (MISCELLANEOUS) ×2 IMPLANT
SYRINGE IRR TOOMEY STRL 70CC (SYRINGE) ×2 IMPLANT
WATER STERILE IRR 1000ML POUR (IV SOLUTION) ×2 IMPLANT
WIRE SENSOR 0.038 NOT ANGLED (WIRE) ×1 IMPLANT

## 2016-01-13 NOTE — Telephone Encounter (Signed)
-----   Message from Hollice Espy, MD sent at 01/13/2016  1:00 PM EDT ----- Regarding: RUS in 4 weeks Please ensure that this guy gets a RUS in 5 week.    He already has f/u with me on July 27th so he can keep that f/u with me.  He can have the renal ultrasound in 5 weeks and I will just follow up with a phone call for that study/    Hollice Espy, MD

## 2016-01-13 NOTE — Op Note (Signed)
Date of procedure: 01/13/2016  Preoperative diagnosis:  1. Right flank pain 2. Right nephrolithiasis   Postoperative diagnosis:  1. Right flank pain 2. Right ureteral stone   Procedure: 1. Right ureteroscopy 2. Laser lithotripsy 3. Right retrograde pyelogram 4. Basket extraction of Stone fragment 5. Right ureteral stent placement  Surgeon: Hollice Espy, MD  Anesthesia: General  Complications: None  Intraoperative findings: Large greater than 8 mm stone latched just proximal to the iliacs within the mid/ distal ureter.  Stone was treated and all fragments removed via basket. Upper tract ureteroscopy also performed and no residual stones within the right collecting system.  EBL: minimal  Specimens: stone fragments given to patient   Drains: 6 x 26 Fr JJ ureteral on right  Indication: Roger Rodriguez is a 76 y.o. patient with history of bilateral nephrolithiasis he previously underwent treatment of his left-sided ureteral stones who presented with acute onset right flank pain. He is a known at least 7 mm right upper pole stone which is suspected to be the cause of his acute flank pain.  He did have a positive preoperative urine culture which was treated appropriately with 7 days of antibiotics prior to today's procedure.  After reviewing the management options for treatment, he elected to proceed with the above surgical procedure(s). We have discussed the potential benefits and risks of the procedure, side effects of the proposed treatment, the likelihood of the patient achieving the goals of the procedure, and any potential problems that might occur during the procedure or recuperation. Informed consent has been obtained.  Description of procedure:  The patient was taken to the operating room and general anesthesia was induced.  The patient was placed in the dorsal lithotomy position, prepped and draped in the usual sterile fashion, and preoperative antibiotics were administered. A  preoperative time-out was performed.   23 French rigid cystoscope was advanced per urethra into the bladder. Attention was turned to the right ureteral orifice which was cannulated using a 5 [place] open-ended ureteral catheter. Of note, on scout imaging, there was calcification seen just proximal to the iliac vessels within the bony pelvis. A gentle retrograde pyelogram was performed which did reveal a filling defect in this area and mild proximal hydroureteronephrosis. A wire was placed beyond this the level of the collecting system without difficulty. A semirigid ureteroscope was then advanced alongside the wire up to level of this calcification. A stone was identified which was quite large and obstructing appearing. A 335  laser fiber was then brought in and using settings of 0.8 J and 10 Hz, the stone was fragmented into proximally 8 or 9 small pieces. A 1.9 French stent on basket was then used to extract each of these pieces in place and within the bladder. The scope was then able to be advanced to the proximal/mid ureter and there is no residual pieces noted. A second sensor wire was then placed under direct visualization up to level of the renal pelvis. A flexible ureteroscope was then advanced over the working wire up to level of the kidney. Fall pyeloscopy was then performed revealing no calcifications within the upper tract collecting system and any of the calyces. There were several Randall's plaques noted but no treatable stones. The scope was backed to level of the UPJ and additional contrast was injected to create a roadmap to ensure that each never calyx had been visualized. Once deemed complete, the scope was backed down the length of the ureter inspecting along the way. There  were no residual fragments, ureteral trauma, or significant edema noted. The safety wire was then backloaded over a rigid cystoscope and a 6 x 26 French double-J ureteral stent was advanced all the way up to level of the  renal pelvis. The wire was partially withdrawn until full coil was noted within the renal pelvis. The wire was then fully withdrawn until full coil was noted within the bladder. The stent was allowed to be tethered to her string which was attached to the patient's glans using Mastisol and Tegaderm was applied or had been drained.  She was then repositioned the supine position, reversed from anesthesia, and taken to the PACU in stable condition.  Plan: Patient will remove his own stent on Friday. He will follow up as previously scheduled. He will have a renal ultrasound in 5 weeks and we will call him with this result to assess for residual cell hydronephrosis.   Hollice Espy, M.D.

## 2016-01-13 NOTE — Interval H&P Note (Signed)
History and Physical Interval Note:  01/13/2016 11:23 AM  Roger Rodriguez  has presented today for surgery, with the diagnosis of RIGHT KIDNEY STONE  The various methods of treatment have been discussed with the patient and family. After consideration of risks, benefits and other options for treatment, the patient has consented to  Procedure(s): URETEROSCOPY WITH HOLMIUM LASER LITHOTRIPSY (Right) CYSTOSCOPY WITH STENT PLACEMENT (Right) as a surgical intervention .  [name] history has been reviewed, patient examined, no change in status, stable for surgery.  I have reviewed the patient's chart and labs.  Questions were answered to the patient's satisfaction.    Preop UCx + E. Coli, appropriately treated, currently on abx  Hollice Espy

## 2016-01-13 NOTE — Anesthesia Preprocedure Evaluation (Signed)
Anesthesia Evaluation  Patient identified by MRN, date of birth, ID band Patient awake    Reviewed: Allergy & Precautions, H&P , NPO status , Patient's Chart, lab work & pertinent test results  History of Anesthesia Complications (+) Family history of anesthesia reaction and history of anesthetic complications  Airway Mallampati: III  TM Distance: >3 FB Neck ROM: full    Dental  (+) Poor Dentition, Chipped   Pulmonary neg shortness of breath, former smoker,    Pulmonary exam normal breath sounds clear to auscultation       Cardiovascular Exercise Tolerance: Good hypertension, (-) angina(-) Past MI and (-) DOE Normal cardiovascular exam Rhythm:regular Rate:Normal     Neuro/Psych PSYCHIATRIC DISORDERS Depression  Neuromuscular disease    GI/Hepatic negative GI ROS, (+) Hepatitis -, A  Endo/Other  negative endocrine ROS  Renal/GU Renal disease  negative genitourinary   Musculoskeletal  (+) Arthritis , Osteoarthritis,    Abdominal   Peds  Hematology negative hematology ROS (+)   Anesthesia Other Findings Past Medical History:   Hyperlipidemia                                               Inguinal hernia                                              Arthritis                                                      Comment:OA   Allergy                                                        Comment:allergic rhinitis   Prostate cancer ([place])                                        Parkinson's disease ([place])                                    History of kidney stones                                     Hypertension                                                   Comment:no meds since parkinsons meds started   Shortness of breath dyspnea  Comment:doe secondary to parkinsons   Depression                                                   Chronic kidney disease                                          Comment:stones   Neuromuscular disorder ([place])                                   Comment:parkinsons   Overactive bladder                                           Incontinence of urine                                        Hematuria                                                    Rhinitis, allergic                                           Family history of adverse reaction to anesthes*             Past Surgical History:   Rotator Cuff tear                                1993           Comment:Repaired   H/O arthroscopy of right knee                    2015         H/O radical protatectomy                                      VASECTOMY                                                       Comment:History   BACK SURGERY                                     2014         URETEROSCOPY WITH HOLMIUM LASER LITHOTRIPSY      2002?        LITHOTRIPSY  2010         EYE SURGERY                                                   HERNIA REPAIR                                                 FRACTURE SURGERY                                                Comment: ankle    JOINT REPLACEMENT                                               Comment:right knee     Reproductive/Obstetrics negative OB ROS                             Anesthesia Physical  Anesthesia Plan  ASA: III  Anesthesia Plan: General ETT and General   Post-op Pain Management:    Induction:   Airway Management Planned: Oral ETT  Additional Equipment:   Intra-op Plan:   Post-operative Plan: Extubation in OR  Informed Consent: I have reviewed the patients History and Physical, chart, labs and discussed the procedure including the risks, benefits and alternatives for the proposed anesthesia with the patient or authorized representative who has indicated his/her understanding and acceptance.   Dental Advisory Given and Dental advisory given  Plan  Discussed with: Anesthesiologist, CRNA and Surgeon  Anesthesia Plan Comments:         Anesthesia Quick Evaluation

## 2016-01-13 NOTE — Transfer of Care (Signed)
Immediate Anesthesia Transfer of Care Note  Patient: Roger Rodriguez  Procedure(s) Performed: Procedure(s): URETEROSCOPY WITH HOLMIUM LASER LITHOTRIPSY (Right) CYSTOSCOPY WITH STENT PLACEMENT (Right)  Patient Location: PACU  Anesthesia Type:General  Level of Consciousness: sedated  Airway & Oxygen Therapy: Patient Spontanous Breathing and Patient connected to face mask oxygen  Post-op Assessment: Report given to RN and Post -op Vital signs reviewed and stable  Post vital signs: Reviewed and stable  Last Vitals:  Filed Vitals:   01/13/16 1112 01/13/16 1258  BP: 151/76 149/82  Pulse: 66 73  Temp: 36.1 C 36.6 C  Resp: 16 12    Last Pain: There were no vitals filed for this visit.       Complications: No apparent anesthesia complications

## 2016-01-13 NOTE — Discharge Instructions (Signed)
You have a ureteral stent in place.  This is a tube that extends from your kidney to your bladder.  This may cause urinary bleeding, burning with urination, and urinary frequency.  Please call our office or present to the ED if you develop fevers >101 or pain which is not able to be controlled with oral pain medications.  You may be given either Flomax and/ or ditropan to help with bladder spasms and stent pain in addition to pain medications.    You have a stent attached string. On Friday, untaped the stent and pulled gently until the entire stent is removed. If you have any trouble, please call our office.  [facility] [address], [place], [place] [address] [phone]   AMBULATORY SURGERY  DISCHARGE INSTRUCTIONS   1) The drugs that you were given will stay in your system until tomorrow so for the next 24 hours you should not:  A) Drive an automobile B) Make any legal decisions C) Drink any alcoholic beverage   2) You may resume regular meals tomorrow.  Today it is better to start with liquids and gradually work up to solid foods.  You may eat anything you prefer, but it is better to start with liquids, then soup and crackers, and gradually work up to solid foods.   3) Please notify your doctor immediately if you have any unusual bleeding, trouble breathing, redness and pain at the surgery site, drainage, fever, or pain not relieved by medication.    4) Additional Instructions:        Please contact your physician with any problems or Same Day Surgery at [phone], Monday through Friday 6 am to 4 pm, or [place] at [facility] number at [phone].

## 2016-01-13 NOTE — Anesthesia Procedure Notes (Signed)
Procedure Name: Intubation Date/Time: 01/13/2016 11:54 AM Performed by: Jonna Clark Pre-anesthesia Checklist: Patient identified, Patient being monitored, Timeout performed, Emergency Drugs available and Suction available Patient Re-evaluated:Patient Re-evaluated prior to inductionOxygen Delivery Method: Circle system utilized Preoxygenation: Pre-oxygenation with 100% oxygen Intubation Type: IV induction Ventilation: Mask ventilation without difficulty Laryngoscope Size: Mac and 3 Grade View: Grade I Tube type: Oral Tube size: 7.5 mm Number of attempts: 2 Placement Confirmation: ETT inserted through vocal cords under direct vision,  positive ETCO2 and breath sounds checked- equal and bilateral Secured at: 21 cm Tube secured with: Tape Dental Injury: Teeth and Oropharynx as per pre-operative assessment

## 2016-01-13 NOTE — Telephone Encounter (Signed)
LMOM that hospital will call to schedule RUS in 5 wks. If they do not call pt should call office to notify.

## 2016-01-13 NOTE — H&P (View-Only) (Signed)
01/03/2016 10:16 AM   [name] 22-Nov-1940 [id number]  Referring provider: [name]., MD [address] [place], [place] [address]  Chief Complaint  Patient presents with  . Right Flank Pain    HPI:  75 year old male with multiple urologic issues who was seen in the office just 3 days ago now complaining of new onset right flank pain for the past 24 hours. He describes this as severe. He decreased passing a stone on this side. He recently underwent left ureteroscopy, laser lithotripsy on 11/20/2015 for a left distal ureteral stone.  No fevers or chills. No nausea or vomiting.  He does have baseline urinary frequency, trouble starting his stream, and sits to void. He is on Mybetriq 25 mg. His voiding symptoms are unchanged. No dysuria or gross hematuria.  Stat KUB ordered today shows no obvious ureteral stone on the right but he does have a history of nonobstructing right-sided stones, the largest of which is a 7 mm upper pole stone.   PMH: Past Medical History  Diagnosis Date  . Hyperlipidemia   . Inguinal hernia   . Arthritis     OA  . Allergy     allergic rhinitis  . Prostate cancer ([place])   . Parkinson's disease ([place])   . History of kidney stones   . Hypertension     no meds since parkinsons meds started  . Shortness of breath dyspnea     doe secondary to parkinsons  . Depression   . Chronic kidney disease     stones  . Neuromuscular disorder ([place])     parkinsons  . Overactive bladder   . Incontinence of urine   . Hematuria   . Rhinitis, allergic   . Family history of adverse reaction to anesthesia     Surgical History: Past Surgical History  Procedure Laterality Date  . Rotator cuff tear  1993    Repaired  . H/o arthroscopy of right knee  2015  . H/o radical protatectomy    . Vasectomy      History  . Back surgery  2014  . Ureteroscopy with holmium laser lithotripsy  2002?  [name] Lithotripsy  2010  . Eye surgery    . Hernia  repair    . Fracture surgery       ankle   . Joint replacement      right knee  . Cystoscopy/ureteroscopy/holmium laser/stent placement Left 11/20/2015    Procedure: CYSTOSCOPY/URETEROSCOPY/HOLMIUM LASER/STENT PLACEMENT;  Surgeon: Hollice Espy, MD;  Location: [facility] ORS;  Service: Urology;  Laterality: Left;    Home Medications:    Medication List       This list is accurate as of: 01/03/16 11:59 PM.  Always use your most recent med list.               carbidopa-levodopa 50-200 MG tablet  Commonly known as:  SINEMET CR  TAKE 2 TABLET BY MOUTH 3 (THREE) TIMES DAILY.     celecoxib 200 MG capsule  Commonly known as:  CELEBREX  TAKE 1 CAPSULE BY MOUTH TWICE DAILY     docusate sodium 100 MG capsule  Commonly known as:  COLACE  Take 1 capsule (100 mg total) by mouth 2 (two) times daily.     erythromycin ophthalmic ointment  Place 1 application into both eyes at bedtime.     gabapentin 400 MG capsule  Commonly known as:  NEURONTIN  Take 400 mg by mouth 3 (three) times  daily.     hydrochlorothiazide 12.5 MG tablet  Commonly known as:  HYDRODIURIL  Take 1 tablet (12.5 mg total) by mouth daily.     HYDROcodone-acetaminophen 5-325 MG tablet  Commonly known as:  NORCO/VICODIN  Take 1-2 tablets by mouth every 6 (six) hours as needed for moderate pain.     loratadine 10 MG tablet  Commonly known as:  CLARITIN  Take 10 mg by mouth daily.     MYRBETRIQ 25 MG Tb24 tablet  Generic drug:  mirabegron ER  Take 25 mg by mouth every morning.     MYRBETRIQ 25 MG Tb24 tablet  Generic drug:  mirabegron ER  Reported on 11/08/2015     RHINOCORT AQUA 32 MCG/ACT nasal spray  Generic drug:  budesonide  Place 1 spray into the nose daily. Reported on 01/03/2016     rOPINIRole 0.5 MG tablet  Commonly known as:  REQUIP  Take 0.5 mg by mouth at bedtime.     triamcinolone cream 0.1 %  Commonly known as:  KENALOG  Reported on 01/03/2016     vitamin C 500 MG tablet  Commonly known as:   ASCORBIC ACID  Take 500 mg by mouth daily.     Vitamin D 2000 units tablet  Take 2,000 Units by mouth daily.     VITAMIN E NATURAL PO  Take 1 capsule by mouth daily.        Allergies: No Known Allergies  Family History: Family History  Problem Relation Age of Onset  . Dementia Mother   . Cerebral aneurysm Father   . Prostate cancer Neg Hx   . Bladder Cancer Paternal Uncle   . Kidney cancer Neg Hx   . Liver cancer Sister     Social History:  reports that he quit smoking about 37 years ago. His smoking use included Cigars. He quit smokeless tobacco use about 12 years ago. His smokeless tobacco use included Chew. He reports that he drinks alcohol. He reports that he does not use illicit drugs.  ROS: UROLOGY Frequent Urination?: Yes Hard to postpone urination?: Yes Burning/pain with urination?: No Get up at night to urinate?: Yes Leakage of urine?: Yes Urine stream starts and stops?: Yes Trouble starting stream?: Yes Do you have to strain to urinate?: Yes Blood in urine?: No Urinary tract infection?: No Sexually transmitted disease?: No Injury to kidneys or bladder?: No Painful intercourse?: No Weak stream?: Yes Erection problems?: No Penile pain?: No  Gastrointestinal Nausea?: No Vomiting?: No Indigestion/heartburn?: No Diarrhea?: No Constipation?: No  Constitutional Fever: No Night sweats?: No Weight loss?: No Fatigue?: No  Skin Skin rash/lesions?: No Itching?: No  Eyes Blurred vision?: No Double vision?: No  Ears/Nose/Throat Sore throat?: No Sinus problems?: No  Hematologic/Lymphatic Swollen glands?: No Easy bruising?: No  Cardiovascular Leg swelling?: No Chest pain?: No  Respiratory Cough?: No Shortness of breath?: No  Endocrine Excessive thirst?: No  Musculoskeletal Back pain?: Yes Joint pain?: No  Neurological Headaches?: No Dizziness?: No  Psychologic Depression?: No Anxiety?: No  Physical Exam: BP 154/77 mmHg   Pulse 86  Ht 5\' 11"  (1.803 m)  Wt 210 lb 9.6 oz (95.528 kg)  BMI 29.39 kg/m2  Constitutional:  Alert and oriented, No acute distress.   HEENT: [place] AT, moist mucus membranes.  Trachea midline, no masses. Cardiovascular: No clubbing, cyanosis, or edema. RRR. Respiratory: Normal respiratory effort, no increased work of breathing. CTAB. GI: Abdomen is soft, nontender, nondistended, no abdominal masses GU: Mild right CVA tenderness. Skin:  No rashes, bruises or suspicious lesions. Neurologic: Grossly intact, no focal deficits, moving all 4 extremities.  Resting tremor noted. Psychiatric: Normal mood. Flat affect.  Laboratory Data: Lab Results  Component Value Date   WBC 6.2 06/19/2014   HGB 14.4 06/19/2014   HCT 43 06/19/2014   MCV 95 04/03/2014   PLT 253 06/19/2014    Lab Results  Component Value Date   CREATININE 1.02 01/07/2016    Lab Results  Component Value Date   PSA <0.1 09/11/2010    No results found for: TESTOSTERONE  Lab Results  Component Value Date   HGBA1C 6.1* 10/02/2015    Urinalysis Results for orders placed or performed in visit on 01/03/16  CULTURE, URINE COMPREHENSIVE  Result Value Ref Range   Urine Culture, Comprehensive Final report (A)    Result 1 Escherichia coli (A)    ANTIMICROBIAL SUSCEPTIBILITY Comment   Microscopic Examination  Result Value Ref Range   WBC, UA 0-5 0 -  5 /hpf   RBC, UA >30 (H) 0 -  2 /hpf   Epithelial Cells (non renal) None seen 0 - 10 /hpf   Crystals Present (A) N/A   Crystal Type Amorphous Sediment N/A   Mucus, UA Present (A) Not Estab.   Bacteria, UA Moderate (A) None seen/Few  Urinalysis, Complete  Result Value Ref Range   Specific Gravity, UA 1.015 1.005 - 1.030   pH, UA 5.5 5.0 - 7.5   Color, UA Yellow Yellow   Appearance Ur Cloudy (A) Clear   Leukocytes, UA Trace (A) Negative   Protein, UA 1+ (A) Negative/Trace   Glucose, UA Negative Negative   Ketones, UA Trace (A) Negative   RBC, UA 3+ (A) Negative    Bilirubin, UA Negative Negative   Urobilinogen, Ur 1.0 0.2 - 1.0 mg/dL   Nitrite, UA Negative Negative   Microscopic Examination See below:     Pertinent Imaging:  CLINICAL DATA: Right-sided nephrolithiasis.  EXAM: ABDOMEN - 1 VIEW  COMPARISON: CT scan 10/30/2015  FINDINGS: Possible tiny stones seen over the lower pole right kidney. No definite stones overlying the expected location of the left kidney. Multiple phleboliths and surgical clips are noted in the lower anatomic pelvis. Patient is status post laminectomy at L3 through L5. Bowel gas pattern is within normal limits.  IMPRESSION: Question small right lower pole renal stone.   Electronically Signed  By: Misty Stanley M.D.  On: 01/03/2016 14:44      Assessment & Plan:    1. Flank pain Acute onset right flank pain consistent with his history of stones. No obvious ureteral stones on KUB today. He does have several nonobstructing right-sided stones measuring up to 7 mm. He would be interested in treating the stones on this side regardless of whether or not he is actively passing a stone. He has no signs or symptoms of infection today.  We discussed various treatment options including ESWL vs. ureteroscopy, laser lithotripsy, and stent. We discussed the risks and benefits of both including bleeding, infection, damage to surrounding structures, efficacy with need for possible further intervention, and need for temporary ureteral stent.  He is interested in proceeding with right ureteroscopy. All his questions were answered.  He was informed to present to the emergency room if he develops any fevers, chills, or any other concerning signs or pain that is unable to be controlled in the interim. - Urinalysis, Complete - CULTURE, URINE COMPREHENSIVE  2. Renal stone As above  3. Urinary frequency Baseline, on  mybetriq - Urinalysis, Complete   Hollice Espy, MD  [facility] [address], [place], [place] [address] [phone]

## 2016-01-15 ENCOUNTER — Encounter: Payer: Self-pay | Admitting: Urology

## 2016-01-15 NOTE — Telephone Encounter (Signed)
Pt called to clarify message left on 01/13/16. Questions answered. Pt requests help removing ureteral stent. Appt made for 01/17/16 @ 2:00. Pt aware.

## 2016-01-15 NOTE — Anesthesia Postprocedure Evaluation (Signed)
Anesthesia Post Note  Patient: Roger Rodriguez  Procedure(s) Performed: Procedure(s) (LRB): URETEROSCOPY WITH HOLMIUM LASER LITHOTRIPSY (Right) CYSTOSCOPY WITH STENT PLACEMENT (Right)  Patient location during evaluation: PACU Anesthesia Type: General Level of consciousness: awake and alert and oriented Pain management: pain level controlled Vital Signs Assessment: post-procedure vital signs reviewed and stable Respiratory status: spontaneous breathing Cardiovascular status: blood pressure returned to baseline Anesthetic complications: no    Last Vitals:  Filed Vitals:   01/13/16 1352 01/13/16 1400  BP: 172/82 154/79  Pulse: 76 81  Temp: 35.9 C   Resp: 16     Last Pain:  Filed Vitals:   01/15/16 0810  PainSc: 0-No pain                 Dov Dill

## 2016-01-17 ENCOUNTER — Ambulatory Visit: Payer: PPO

## 2016-01-17 DIAGNOSIS — N200 Calculus of kidney: Secondary | ICD-10-CM

## 2016-01-17 NOTE — Progress Notes (Signed)
Pt came in to have post op stent pulled. Nurse pulled stent. Pt tolerated well. No s/s of adverse reaction noted. Pt c/o sharpe left flank pain earlier today that has eased off with pain medication. Reinforced with pt to drink plenty of fluids and monitor for fever. Reinforced with pt to go straight to the ER if fever spikes and pain should start to subside within in the next 24-48hrs. Pt voiced understanding.

## 2016-01-24 ENCOUNTER — Encounter: Payer: Self-pay | Admitting: Urology

## 2016-02-03 DIAGNOSIS — L812 Freckles: Secondary | ICD-10-CM | POA: Diagnosis not present

## 2016-02-03 DIAGNOSIS — L570 Actinic keratosis: Secondary | ICD-10-CM | POA: Diagnosis not present

## 2016-02-03 DIAGNOSIS — D485 Neoplasm of uncertain behavior of skin: Secondary | ICD-10-CM | POA: Diagnosis not present

## 2016-02-03 DIAGNOSIS — D0461 Carcinoma in situ of skin of right upper limb, including shoulder: Secondary | ICD-10-CM | POA: Diagnosis not present

## 2016-02-03 DIAGNOSIS — L578 Other skin changes due to chronic exposure to nonionizing radiation: Secondary | ICD-10-CM | POA: Diagnosis not present

## 2016-02-03 DIAGNOSIS — L820 Inflamed seborrheic keratosis: Secondary | ICD-10-CM | POA: Diagnosis not present

## 2016-02-03 DIAGNOSIS — Z1283 Encounter for screening for malignant neoplasm of skin: Secondary | ICD-10-CM | POA: Diagnosis not present

## 2016-02-03 DIAGNOSIS — L821 Other seborrheic keratosis: Secondary | ICD-10-CM | POA: Diagnosis not present

## 2016-02-03 DIAGNOSIS — L720 Epidermal cyst: Secondary | ICD-10-CM | POA: Diagnosis not present

## 2016-02-03 DIAGNOSIS — D1800 Hemangioma unspecified site: Secondary | ICD-10-CM | POA: Diagnosis not present

## 2016-02-03 DIAGNOSIS — D229 Melanocytic nevi, unspecified: Secondary | ICD-10-CM | POA: Diagnosis not present

## 2016-02-06 ENCOUNTER — Ambulatory Visit: Payer: Self-pay | Admitting: Urology

## 2016-02-06 ENCOUNTER — Encounter: Payer: Self-pay | Admitting: Urology

## 2016-02-06 ENCOUNTER — Ambulatory Visit (INDEPENDENT_AMBULATORY_CARE_PROVIDER_SITE_OTHER): Payer: PPO | Admitting: Urology

## 2016-02-06 VITALS — BP 153/92 | HR 79 | Ht 71.0 in | Wt 208.3 lb

## 2016-02-06 DIAGNOSIS — N529 Male erectile dysfunction, unspecified: Secondary | ICD-10-CM | POA: Diagnosis not present

## 2016-02-06 DIAGNOSIS — N200 Calculus of kidney: Secondary | ICD-10-CM

## 2016-02-11 DIAGNOSIS — L578 Other skin changes due to chronic exposure to nonionizing radiation: Secondary | ICD-10-CM | POA: Diagnosis not present

## 2016-02-11 DIAGNOSIS — L720 Epidermal cyst: Secondary | ICD-10-CM | POA: Diagnosis not present

## 2016-02-11 DIAGNOSIS — Z85828 Personal history of other malignant neoplasm of skin: Secondary | ICD-10-CM | POA: Diagnosis not present

## 2016-02-11 NOTE — Progress Notes (Signed)
02/06/16  CC: injection teaching  HPI:  A 75 year old male with refractory erectile dysfunction who returns to the office today for Trymex injection teaching. He is previously on this medication  past and is familiar with that he use this medication.  Procedure:  Patient's phallus was placed on stretch. Injection site cleaned with alcohol pad. Patient was able to self inject tri-mix, 0.5 mL's of a papaverine 30 mg, phentolamine 1,mg, and prostaglandin 10 g solution into the right mid lateral corpora.    After approximate 5 minutes, the patient was able to achieve 50% tumescent erection. This was nonpainful.  Patient was advised to return if his erection fails to resolve within 4 hours or becomes painful.  He was advised to increase his dose up to 1 mL at home and we will continue to titrate the dose over the phone as needed.  Patient will return as were the schedule.   Hollice Espy, MD

## 2016-02-18 DIAGNOSIS — G2581 Restless legs syndrome: Secondary | ICD-10-CM | POA: Diagnosis not present

## 2016-02-18 DIAGNOSIS — G20 Parkinson's disease: Secondary | ICD-10-CM | POA: Diagnosis not present

## 2016-02-20 ENCOUNTER — Ambulatory Visit
Admission: RE | Admit: 2016-02-20 | Discharge: 2016-02-20 | Disposition: A | Discharge status: Home / Self Care | Payer: PPO | Source: Ambulatory Visit | Attending: Urology | Admitting: Urology

## 2016-02-20 DIAGNOSIS — N200 Calculus of kidney: Secondary | ICD-10-CM | POA: Insufficient documentation

## 2016-02-20 DIAGNOSIS — N281 Cyst of kidney, acquired: Secondary | ICD-10-CM | POA: Insufficient documentation

## 2016-02-24 ENCOUNTER — Telehealth: Payer: Self-pay

## 2016-02-24 NOTE — Telephone Encounter (Signed)
LMOM

## 2016-02-24 NOTE — Telephone Encounter (Signed)
-----   Message from Hollice Espy, MD sent at 02/20/2016  8:21 PM EDT ----- Please with this patient and his renal ultrasound looks good.  Incidentally, a fatty liver was appreciated so please have him watch his diet with more more heart healthy choices.  Hollice Espy, MD

## 2016-02-25 NOTE — Telephone Encounter (Signed)
Spoke with patients and he agreed to eat healthier.   Sharyn Lull

## 2016-02-25 NOTE — Telephone Encounter (Signed)
Spoke with pt in reference to RUS and diet. Pt voiced understanding.

## 2016-03-15 ENCOUNTER — Other Ambulatory Visit: Payer: Self-pay | Admitting: Family Medicine

## 2016-04-02 ENCOUNTER — Encounter: Payer: Self-pay | Admitting: Family Medicine

## 2016-04-02 ENCOUNTER — Ambulatory Visit (INDEPENDENT_AMBULATORY_CARE_PROVIDER_SITE_OTHER): Payer: PPO | Admitting: Family Medicine

## 2016-04-02 VITALS — BP 132/80 | HR 68 | Temp 97.5°F | Resp 16 | Wt 210.0 lb

## 2016-04-02 DIAGNOSIS — G20 Parkinson's disease: Secondary | ICD-10-CM

## 2016-04-02 DIAGNOSIS — Z23 Encounter for immunization: Secondary | ICD-10-CM | POA: Diagnosis not present

## 2016-04-02 DIAGNOSIS — I10 Essential (primary) hypertension: Secondary | ICD-10-CM | POA: Diagnosis not present

## 2016-04-02 DIAGNOSIS — R7303 Prediabetes: Secondary | ICD-10-CM

## 2016-04-02 DIAGNOSIS — E785 Hyperlipidemia, unspecified: Secondary | ICD-10-CM

## 2016-04-02 DIAGNOSIS — G2581 Restless legs syndrome: Secondary | ICD-10-CM

## 2016-04-02 LAB — POCT GLYCOSYLATED HEMOGLOBIN (HGB A1C)
Est. average glucose Bld gHb Est-mCnc: 126
Hemoglobin A1C: 6

## 2016-04-02 MED ORDER — LISINOPRIL 2.5 MG PO TABS: 2.5000 mg | ORAL_TABLET | Freq: Two times a day (BID) | ORAL | 3 refills | Status: DC

## 2016-04-02 NOTE — Progress Notes (Signed)
Patient: Roger Rodriguez Male    DOB: [birth year]   75 y.o.   MRN: [id number] Visit Date: 04/02/2016  Today's Provider: Wilhemena Durie, MD   Chief Complaint  Patient presents with  . Hypertension  . Hyperlipidemia  . Hyperglycemia   Subjective:    HPI      Hyperglycemia, Follow-up:   Lab Results  Component Value Date   HGBA1C 6.1 (H) 10/02/2015   HGBA1C 6.3 (A) 10/04/2014   Last seen for hyperglycemia 6 months ago.  Management since then includes none. Current symptoms include none and have been unchanged.  Current diet: in general, a "healthy" diet   Current exercise: goes to gym twice weekly.  ------------------------------------------------------------------------   Hypertension, follow-up:  BP Readings from Last 3 Encounters:  04/02/16 132/80  02/06/16 (!) 153/92  01/13/16 (!) 154/79    He was last seen for hypertension 6 months ago.  BP at that visit was 144/70. Management since that visit includes none.He reports excellent compliance with treatment. He is exercising. He is adherent to low salt diet.   Outside blood pressures are ranging from 126/68- 199/105. He is experiencing dyspnea and fatigue.  Patient denies chest pain, lower extremity edema, near-syncope, palpitations and syncope.   Cardiovascular risk factors include advanced age (older than 35 for men, 67 for women), dyslipidemia, hypertension and male gender.   ------------------------------------------------------------------------    Lipid/Cholesterol, Follow-up:   Last seen for this 6 months ago.  Management since that visit includes none.  Last Lipid Panel:    Component Value Date/Time   CHOL 190 10/02/2015 0951   TRIG 116 10/02/2015 0951   HDL 38 (L) 10/02/2015 0951   CHOLHDL 5.0 10/02/2015 0951   LDLCALC 129 (H) 10/02/2015 0951    Wt Readings from Last 3 Encounters:  04/02/16 210 lb (95.3 kg)  02/06/16 208 lb 4.8 oz (94.5 kg)  01/13/16 210 lb (95.3 kg)     ------------------------------------------------------------------------    No Known Allergies   Current Outpatient Prescriptions:  .  carbidopa-levodopa (SINEMET CR) 50-200 MG per tablet, TAKE 2 TABLET BY MOUTH 3 (THREE) TIMES DAILY., Disp: , Rfl: 4 .  celecoxib (CELEBREX) 200 MG capsule, TAKE 1 CAPSULE BY MOUTH TWICE DAILY, Disp: 60 capsule, Rfl: 5 .  Cholecalciferol (VITAMIN D) 2000 UNITS tablet, Take 2,000 Units by mouth daily. , Disp: , Rfl:  .  erythromycin ophthalmic ointment, Place 1 application into both eyes at bedtime. , Disp: , Rfl:  .  gabapentin (NEURONTIN) 400 MG capsule, Take 400 mg by mouth 3 (three) times daily. , Disp: , Rfl: 2 .  hydrochlorothiazide (HYDRODIURIL) 12.5 MG tablet, Take 1 tablet (12.5 mg total) by mouth daily., Disp: 30 tablet, Rfl: 12 .  loratadine (CLARITIN) 10 MG tablet, Take 10 mg by mouth daily., Disp: , Rfl:  .  mirabegron ER (MYRBETRIQ) 25 MG TB24 tablet, Take 25 mg by mouth every morning. , Disp: , Rfl:  .  rOPINIRole (REQUIP) 0.5 MG tablet, Take 0.5 mg by mouth 2 (two) times daily. , Disp: , Rfl:  .  vitamin C (ASCORBIC ACID) 500 MG tablet, Take 500 mg by mouth daily., Disp: , Rfl:  .  VITAMIN E NATURAL PO, Take 1 capsule by mouth daily. , Disp: , Rfl:  .  docusate sodium (COLACE) 100 MG capsule, Take 1 capsule (100 mg total) by mouth 2 (two) times daily. (Patient not taking: Reported on 04/02/2016), Disp: 60 capsule, Rfl: 0 .  HYDROcodone-acetaminophen (NORCO/VICODIN) 5-325  MG tablet, Take 1-2 tablets by mouth every 6 (six) hours as needed for moderate pain. (Patient not taking: Reported on 04/02/2016), Disp: 10 tablet, Rfl: 0  Review of Systems  Constitutional: Positive for diaphoresis and fatigue. Negative for activity change, appetite change, chills, fever and unexpected weight change.  Respiratory: Positive for shortness of breath.   Cardiovascular: Positive for leg swelling. Negative for chest pain and palpitations.  Endocrine: Negative  for polydipsia and polyuria.  Musculoskeletal: Positive for gait problem (parkinson's).  Allergic/Immunologic: Negative.   Neurological: Positive for tremors.  Psychiatric/Behavioral: Negative.     Social History  Substance Use Topics  . Smoking status: Former Smoker    Types: Cigars    Quit date: 11/12/1978  . Smokeless tobacco: Former Systems developer    Types: [place] date: 11/12/2003     Comment: Occassionally, also chewed tobacco till 2005. Quit smoking 1973  . Alcohol use 0.0 oz/week     Comment: occasional   Objective:   BP 132/80 (BP Location: Right Arm, Patient Position: Sitting, Cuff Size: Large)   Pulse 68   Temp 97.5 F (36.4 C) (Oral)   Resp 16   Wt 210 lb (95.3 kg)   BMI 29.29 kg/m   Physical Exam  Constitutional: He is oriented to person, place, and time. He appears well-developed and well-nourished.  Cardiovascular: Normal rate, regular rhythm and normal heart sounds.   Pulmonary/Chest: Effort normal and breath sounds normal. No respiratory distress.  Musculoskeletal: He exhibits edema (trace bilaterally).  Neurological: He is alert and oriented to person, place, and time. Coordination abnormal.  Resting tremor of left hand  Skin: Skin is warm and dry.  Psychiatric: He has a normal mood and affect. His behavior is normal.        Assessment & Plan:     1. Essential (primary) hypertension Stable. Refill provided. - lisinopril (PRINIVIL,ZESTRIL) 2.5 MG tablet; Take 1 tablet (2.5 mg total) by mouth 2 (two) times daily.  Dispense: 180 tablet; Refill: 3  2. Borderline diabetes Stable. Continue plan of care. - POCT glycosylated hemoglobin (Hb A1C) Results for orders placed or performed in visit on 04/02/16  POCT glycosylated hemoglobin (Hb A1C)  Result Value Ref Range   Hemoglobin A1C 6.0    Est. average glucose Bld gHb Est-mCnc 126      3. Idiopathic Parkinson's disease ([place]) FU with neurology as scheduled. I think this is the major source of patient's  physical decline. 4. Hyperlipidemia Continue plan of care.  5. Flu vaccine need Administered today. - Flu vaccine HIGH DOSE PF  6. Restless leg Declines Requip dose change today. FU with neuro as scheduled.     Patient seen and examined by Miguel Aschoff, MD, and note scribed by Renaldo Fiddler, CMA.  I have done the exam and reviewed the above chart and it is accurate to the best of my knowledge.  Richard Cranford Mon, MD  [facility]

## 2016-04-10 ENCOUNTER — Other Ambulatory Visit: Payer: Self-pay | Admitting: Urology

## 2016-04-10 ENCOUNTER — Telehealth: Payer: Self-pay | Admitting: Urology

## 2016-04-10 DIAGNOSIS — N3281 Overactive bladder: Secondary | ICD-10-CM

## 2016-04-10 MED ORDER — MIRABEGRON ER 25 MG PO TB24: 25.0000 mg | ORAL_TABLET | Freq: Every day | ORAL | 3 refills | Status: DC

## 2016-04-10 NOTE — Telephone Encounter (Signed)
Pt requesting refill on Myrbetriq, pharm says they have not received request from [facility].

## 2016-04-10 NOTE — Telephone Encounter (Signed)
Refills given.

## 2016-05-04 ENCOUNTER — Telehealth: Payer: Self-pay

## 2016-05-04 ENCOUNTER — Ambulatory Visit (INDEPENDENT_AMBULATORY_CARE_PROVIDER_SITE_OTHER): Payer: PPO

## 2016-05-04 VITALS — BP 168/93 | HR 84 | Temp 97.6°F | Wt 213.1 lb

## 2016-05-04 DIAGNOSIS — N390 Urinary tract infection, site not specified: Secondary | ICD-10-CM

## 2016-05-04 LAB — MICROSCOPIC EXAMINATION: Bacteria, UA: NONE SEEN

## 2016-05-04 LAB — URINALYSIS, COMPLETE
Bilirubin, UA: NEGATIVE
Glucose, UA: NEGATIVE
Leukocytes, UA: NEGATIVE
Nitrite, UA: NEGATIVE
Specific Gravity, UA: 1.02 (ref 1.005–1.030)
Urobilinogen, Ur: 0.2 mg/dL (ref 0.2–1.0)
pH, UA: 6 (ref 5.0–7.5)

## 2016-05-04 NOTE — Progress Notes (Signed)
Pt came in today with c/o urinary frequency and urgency, hard to postpone urination, leakage of urine, lower back pain, foul smelling urine, and running a fever at night, 100.2. A clean catch was obtained for u/a and cx.   Blood pressure (!) 168/93, pulse 84, temperature 97.6 F (36.4 C), weight 213 lb 1.6 oz (96.7 kg).

## 2016-05-04 NOTE — Telephone Encounter (Signed)
Pt called stating he is having lower back pain again. Pt requested to come in for a urine check. Pt was added to nurse schedule for today.

## 2016-05-05 ENCOUNTER — Telehealth: Payer: Self-pay

## 2016-05-05 NOTE — Telephone Encounter (Signed)
Spoke with pt in reference to u/a results. Pt voiced understanding.  

## 2016-05-05 NOTE — Telephone Encounter (Signed)
-----   Message from Hollice Espy, MD sent at 05/04/2016  1:41 PM EDT ----- No evidence of infection.

## 2016-05-06 LAB — CULTURE, URINE COMPREHENSIVE

## 2016-05-26 DIAGNOSIS — H01003 Unspecified blepharitis right eye, unspecified eyelid: Secondary | ICD-10-CM | POA: Diagnosis not present

## 2016-06-11 DIAGNOSIS — Z96651 Presence of right artificial knee joint: Secondary | ICD-10-CM | POA: Diagnosis not present

## 2016-06-11 DIAGNOSIS — M1712 Unilateral primary osteoarthritis, left knee: Secondary | ICD-10-CM | POA: Diagnosis not present

## 2016-06-23 ENCOUNTER — Other Ambulatory Visit: Payer: Self-pay | Admitting: Family Medicine

## 2016-06-24 DIAGNOSIS — D229 Melanocytic nevi, unspecified: Secondary | ICD-10-CM | POA: Diagnosis not present

## 2016-06-24 DIAGNOSIS — L820 Inflamed seborrheic keratosis: Secondary | ICD-10-CM | POA: Diagnosis not present

## 2016-06-24 DIAGNOSIS — Z1283 Encounter for screening for malignant neoplasm of skin: Secondary | ICD-10-CM | POA: Diagnosis not present

## 2016-06-24 DIAGNOSIS — L720 Epidermal cyst: Secondary | ICD-10-CM | POA: Diagnosis not present

## 2016-06-24 DIAGNOSIS — D179 Benign lipomatous neoplasm, unspecified: Secondary | ICD-10-CM | POA: Diagnosis not present

## 2016-06-24 DIAGNOSIS — L578 Other skin changes due to chronic exposure to nonionizing radiation: Secondary | ICD-10-CM | POA: Diagnosis not present

## 2016-06-24 DIAGNOSIS — L812 Freckles: Secondary | ICD-10-CM | POA: Diagnosis not present

## 2016-06-24 DIAGNOSIS — L570 Actinic keratosis: Secondary | ICD-10-CM | POA: Diagnosis not present

## 2016-06-24 DIAGNOSIS — L718 Other rosacea: Secondary | ICD-10-CM | POA: Diagnosis not present

## 2016-06-24 DIAGNOSIS — L219 Seborrheic dermatitis, unspecified: Secondary | ICD-10-CM | POA: Diagnosis not present

## 2016-06-24 DIAGNOSIS — L821 Other seborrheic keratosis: Secondary | ICD-10-CM | POA: Diagnosis not present

## 2016-06-24 DIAGNOSIS — Z85828 Personal history of other malignant neoplasm of skin: Secondary | ICD-10-CM | POA: Diagnosis not present

## 2016-06-25 ENCOUNTER — Encounter: Payer: Self-pay | Admitting: Urology

## 2016-06-25 ENCOUNTER — Telehealth: Payer: Self-pay

## 2016-06-25 ENCOUNTER — Ambulatory Visit: Payer: PPO | Admitting: Urology

## 2016-06-25 VITALS — BP 171/83 | HR 85 | Ht 71.0 in | Wt 215.0 lb

## 2016-06-25 DIAGNOSIS — Z87442 Personal history of urinary calculi: Secondary | ICD-10-CM | POA: Diagnosis not present

## 2016-06-25 DIAGNOSIS — N393 Stress incontinence (female) (male): Secondary | ICD-10-CM | POA: Diagnosis not present

## 2016-06-25 DIAGNOSIS — Z8546 Personal history of malignant neoplasm of prostate: Secondary | ICD-10-CM

## 2016-06-25 DIAGNOSIS — N529 Male erectile dysfunction, unspecified: Secondary | ICD-10-CM | POA: Diagnosis not present

## 2016-06-25 DIAGNOSIS — N3281 Overactive bladder: Secondary | ICD-10-CM

## 2016-06-25 MED ORDER — MIRABEGRON ER 25 MG PO TB24: 25.0000 mg | ORAL_TABLET | Freq: Two times a day (BID) | ORAL | 3 refills | Status: DC

## 2016-06-25 NOTE — Telephone Encounter (Signed)
Script was called in to [facility] for Super Trimix Qty-10 / 63mL syringes instruction given to the pt per provider.

## 2016-06-25 NOTE — Progress Notes (Signed)
11:54 AM  06/25/16  Roger Rodriguez February 05, 1941 [id number]  Referring provider: [name]., MD [address] [place], [place] [address]  Chief Complaint  Patient presents with  . Erectile Dysfunction    51month    HPI:  75 year old male with multiple medical issues.    Prostate cancer/ SUI Patient has a personal history of prostate cancer and underwent open radical prostatectomy in March 2006 by Dr. Quillian Quince.  Pathology unknown.  NED.   Most recent PSA <0.1 on 10/02/15.   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    Bladder overactivity /urinary frequency Patient has significant urinary symptoms today including urinary frequency and SUI.  He does have trouble starting his stream and has to sit to void.   He is on Mybetriq 25 mg which helps.   He does have a history of Parkinson disease.  Hematuria Microsocpic hematuria times multiple. Status post CT urogram 4/17.  Cysto negative.    History of kidney stones He does have a personal history of kidney stones.  He has had several ESWL in the past.  CT urogram on 10/2015 showedevidence of left distal ureteral calculi, measuring up to 8 mm. He also has nonobstructing calculi bilaterally.  S/p L URS, LL on 11/2015 and R URS, LL on 01/13/16  Follow up RUS 02/20/16 without stones or hydronephrosis, mild fullness of left collecting system.  Stone analysis Ca oxalate monohydrate 80%, Ca oxalate dihydrate 10%, Ca phosphate 5% and uric acid 5%.  He has been drinking lemonade.  He does not drink enough water.  He does not add additional fat.    No dysuria, fevers, or pain.  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  He has tried Viagra along with other PDE 5 inhibitors not affected. He has previously tried alprostadil injections with fairly decent success although finds these to be too expensive.   He has tried trimix   Using 1 mL trimix with ~50-80 % erection but only last 20-25 min.  He  would like trying to up titrate the dose to help facilitate a more satisfactory response   PMH: Past Medical History:  Diagnosis Date  . Allergy    allergic rhinitis  . Arthritis    OA  . Chronic kidney disease    stones  . Depression   . Family history of adverse reaction to anesthesia   . Hematuria   . History of kidney stones   . Hyperlipidemia   . Hypertension    no meds since parkinsons meds started  . Incontinence of urine   . Inguinal hernia   . Neuromuscular disorder ([place])    parkinsons  . Overactive bladder   . Parkinson's disease ([place])   . Prostate cancer ([place])   . Rhinitis, allergic   . Shortness of breath dyspnea    doe secondary to parkinsons    Surgical History: Past Surgical History:  Procedure Laterality Date  . BACK SURGERY  2014  . CYSTOSCOPY WITH STENT PLACEMENT Right 01/13/2016   Procedure: CYSTOSCOPY WITH STENT PLACEMENT;  Surgeon: Hollice Espy, MD;  Location: [facility];  Service: Urology;  Laterality: Right;  . CYSTOSCOPY/URETEROSCOPY/HOLMIUM LASER/STENT PLACEMENT Left 11/20/2015   Procedure: CYSTOSCOPY/URETEROSCOPY/HOLMIUM LASER/STENT PLACEMENT;  Surgeon: Hollice Espy, MD;  Location: [facility];  Service: Urology;  Laterality: Left;  . EYE SURGERY    . FRACTURE SURGERY      ankle   . H/O arthroscopy of right knee  2015  . H/O radical protatectomy    . HERNIA REPAIR    . JOINT REPLACEMENT     right knee  . LITHOTRIPSY  2010  . Rotator Cuff tear  1993   Repaired  . URETEROSCOPY WITH HOLMIUM LASER LITHOTRIPSY  2002?  [name] URETEROSCOPY WITH HOLMIUM LASER LITHOTRIPSY Right 01/13/2016   Procedure: URETEROSCOPY WITH HOLMIUM LASER LITHOTRIPSY;  Surgeon: Hollice Espy, MD;  Location: [facility];  Service: Urology;  Laterality: Right;  [name] VASECTOMY     History    Home Medications:    Medication List       Accurate as of 06/25/16 11:54 AM. Always use your most recent med list.          budesonide 32 MCG/ACT nasal spray Commonly known as:   RHINOCORT AQUA USE 1 PUFF IN EACH NOSTRIL EVERY DAY AS NEEDED   carbidopa-levodopa 50-200 MG tablet Commonly known as:  SINEMET CR TAKE 2 TABLET BY MOUTH 3 (THREE) TIMES DAILY.   celecoxib 200 MG capsule Commonly known as:  CELEBREX TAKE 1 CAPSULE BY MOUTH TWICE DAILY   erythromycin ophthalmic ointment Place 1 application into both eyes at bedtime.   gabapentin 400 MG capsule Commonly known as:  NEURONTIN Take 400 mg by mouth 3 (three) times daily.   hydrochlorothiazide 12.5 MG tablet Commonly known as:  HYDRODIURIL Take 1 tablet (12.5 mg total) by mouth daily.   lisinopril 2.5 MG tablet Commonly known as:  PRINIVIL,ZESTRIL Take 1 tablet (2.5 mg total) by mouth 2 (two) times daily.   loratadine 10 MG tablet Commonly known as:  CLARITIN Take 10 mg by mouth daily.   mirabegron ER 25 MG Tb24 tablet Commonly known as:  MYRBETRIQ Take 1 tablet (25 mg total) by mouth 2 (two) times daily.   rOPINIRole 0.5 MG tablet Commonly known as:  REQUIP Take 0.5 mg by mouth 3 (three) times daily.   vitamin C 500 MG tablet Commonly known as:  ASCORBIC ACID Take 500 mg by mouth daily.   Vitamin D 2000 units tablet Take 2,000 Units by mouth daily.   VITAMIN E NATURAL PO Take 1 capsule by mouth daily.       Allergies: No Known Allergies  Family History: Family History  Problem Relation Age of Onset  . Dementia Mother   . Cerebral aneurysm Father   . Bladder Cancer Paternal Uncle   . Liver cancer Sister   . Prostate cancer Neg Hx   . Kidney cancer Neg Hx     Social History:  reports that he quit smoking about 37 years ago. His smoking use included Cigars. He quit smokeless tobacco use about 12 years ago. His smokeless tobacco use included Chew. He reports that he drinks alcohol. He reports that he does not use drugs.  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 His wife passed away this fall of Parkinson's disease/dementia.  Physical Exam: BP (!)  171/83   Pulse 85   Ht 5\' 11"  (1.803 m)   Wt 215 lb (97.5 kg)   BMI 29.99 kg/m   Constitutional:  Alert and oriented, No acute distress. HEENT: [place] AT, moist mucus membranes.  Trachea midline, no masses. Cardiovascular: No clubbing, cyanosis, or edema.  Respiratory: Normal respiratory effort, no increased work of breathing. GI: Abdomen is soft, nontender, nondistended, no abdominal masses GU: No CVA tenderness.  Skin: No rashes, bruises or suspicious lesions. Neurologic: Grossly intact, no focal deficits, moving all 4 extremities.  Mild resting tremor noted.  Ambulates with cane. Psychiatric: Normal mood and affect.  Laboratory Data: Lab Results  Component Value Date   WBC 6.2 06/19/2014   HGB 14.4 06/19/2014   HCT 43 06/19/2014   MCV 95 04/03/2014   PLT 253 06/19/2014    Lab Results  Component Value Date   CREATININE 1.02 01/07/2016    PSA as above  Lab Results  Component Value Date   HGBA1C 6.0 04/02/2016    Pertinent Imaging: No new interval imaging  Assessment & Plan:    1.  History of kidney stones S/p L URS 11/2015 and R URS 7/27 No residual hydronephrosis on f/u RUS [place] pain has resolved Recommend 24 hour urine metabolic work up- will arrange prior to next visit given multiple bilateral stones over past year  2. Malignant neoplasm of prostate [facility]), history of S/p prostatectomy 2006, NED.  PSA undetectable.  Recommend annual PSA. Due 09/2015  3. OAB (overactive bladder) Continue  Mybetriq 25mg , doing well  4. Urinary incontinence, stress, male Wears depends.  5. Microscopic hematuria S/p negative hematuria work in 2017, stones treated  6. ED Currently using trimix (standard dose) 1 mg with some but not ideal response Will increase to "super trimix" and start with 0.5 mL, possibly titrate up to 1 mL if no response    Return in about 3 months (around 09/23/2016) for PSA, KUB, 24 hour urine.   Hollice Espy, MD  [facility] [address] [place], [place] [address] [phone]

## 2016-07-03 ENCOUNTER — Ambulatory Visit: Payer: PPO | Admitting: Urology

## 2016-07-08 ENCOUNTER — Ambulatory Visit: Payer: PPO | Admitting: Urology

## 2016-07-21 DIAGNOSIS — G20 Parkinson's disease: Secondary | ICD-10-CM | POA: Diagnosis not present

## 2016-07-21 DIAGNOSIS — G2581 Restless legs syndrome: Secondary | ICD-10-CM | POA: Diagnosis not present

## 2016-08-10 ENCOUNTER — Ambulatory Visit (INDEPENDENT_AMBULATORY_CARE_PROVIDER_SITE_OTHER): Payer: PPO | Admitting: Family Medicine

## 2016-08-10 VITALS — BP 128/60 | HR 92 | Temp 98.0°F | Resp 14 | Wt 225.0 lb

## 2016-08-10 DIAGNOSIS — L03011 Cellulitis of right finger: Secondary | ICD-10-CM

## 2016-08-10 MED ORDER — AMOXICILLIN 500 MG PO CAPS: 500.0000 mg | ORAL_CAPSULE | Freq: Three times a day (TID) | ORAL | 0 refills | Status: DC

## 2016-08-10 NOTE — Progress Notes (Signed)
Roger Rodriguez  MRN: [id number] DOB: 1941/07/01  Subjective:  HPI   The patient is a 76 year old male who comes in for evaluation of an infected right index finger.  He states it started 4 days ago with a hang nail that he removed and then over the weekend noticed it throbbing and infected.  He states that due to his Parkinson's disease his hand is not steady enough or he would have opened it himself.  He denies fever but states he has had drainage from the finger.  Patient Active Problem List   Diagnosis Date Noted  . Parkinson's disease ([place]) 09/18/2015  . Restless leg 09/18/2015  . Malignant neoplasm of prostate ([place]) 05/22/2015  . Hepatitis A 04/02/2015  . Allergic rhinitis 04/02/2015  . Arthritis 04/02/2015  . Narrowing of intervertebral disc space 04/02/2015  . 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 Accumulation of fluid in tissues 04/02/2015  . Essential (primary) hypertension 04/02/2015  . Borderline diabetes 04/02/2015  . Hyperlipidemia 04/02/2015  . BP (high blood pressure) 04/02/2015  . Infected sebaceous cyst 04/02/2015  . Hernia, inguinal 04/02/2015  . Leg pain 04/02/2015  . Lumbar canal stenosis 04/02/2015  . 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 Malignant melanoma ([place]) 04/02/2015  . Arthritis, degenerative 04/02/2015  . Fatigue 04/02/2015  . Idiopathic Parkinson's disease ([place]) 04/02/2015  . Plantar fasciitis 04/02/2015  . CA of prostate ([place]) 04/02/2015  . Benign essential tremor 04/02/2015  . Lower urinary tract infection 04/02/2015  . Degeneration of intervertebral disc of lumbosacral region 05/10/2012    Past Medical History:  Diagnosis Date  . Allergy    allergic rhinitis  . Arthritis    OA  . Chronic kidney disease    stones  . Depression   . Family history of adverse reaction to anesthesia   . Hematuria   . History of kidney stones   . Hyperlipidemia   . Hypertension    no meds since parkinsons meds started  . Incontinence of  urine   . Inguinal hernia   . Neuromuscular disorder ([place])    parkinsons  . Overactive bladder   . Parkinson's disease ([place])   . Prostate cancer ([facility])   . Rhinitis, allergic   . Shortness of breath dyspnea    doe secondary to parkinsons    Social History   Social History  . Marital status: Married    Spouse name: N/A  . Number of children: N/A  . Years of education: N/A   Occupational History  . Not on file.   Social History Main Topics  . Smoking status: Former Smoker    Types: Cigars    Quit date: 11/12/1978  . Smokeless tobacco: Former Systems developer    Types: [place] date: 11/12/2003     Comment: Occassionally, also chewed tobacco till 2005. Quit smoking 1973  . Alcohol use 0.0 oz/week     Comment: occasional  . Drug use: No  . Sexual activity: No   Other Topics Concern  . Not on file   Social History Narrative  . No narrative on file    Outpatient Encounter Prescriptions as of 08/10/2016  Medication Sig  . budesonide (RHINOCORT AQUA) 32 MCG/ACT nasal spray USE 1 PUFF IN EACH NOSTRIL EVERY DAY AS NEEDED  . carbidopa-levodopa (SINEMET CR) 50-200 MG per tablet TAKE 2 TABLET BY MOUTH 3 (THREE) TIMES DAILY.  . celecoxib (CELEBREX) 200 MG capsule TAKE 1 CAPSULE BY MOUTH TWICE DAILY  .  Cholecalciferol (VITAMIN D) 2000 UNITS tablet Take 2,000 Units by mouth daily.   [name] erythromycin ophthalmic ointment Place 1 application into both eyes at bedtime.   . gabapentin (NEURONTIN) 400 MG capsule Take 400 mg by mouth 3 (three) times daily.   . hydrochlorothiazide (HYDRODIURIL) 12.5 MG tablet Take 1 tablet (12.5 mg total) by mouth daily.  [name] lisinopril (PRINIVIL,ZESTRIL) 2.5 MG tablet Take 1 tablet (2.5 mg total) by mouth 2 (two) times daily.  [name] loratadine (CLARITIN) 10 MG tablet Take 10 mg by mouth daily.  . mirabegron ER (MYRBETRIQ) 25 MG TB24 tablet Take 1 tablet (25 mg total) by mouth 2 (two) times daily.  [name] rOPINIRole (REQUIP) 0.5 MG tablet Take 0.5 mg by mouth 3 (three) times  daily.   . vitamin C (ASCORBIC ACID) 500 MG tablet Take 500 mg by mouth daily.  [name] VITAMIN E NATURAL PO Take 1 capsule by mouth daily.    No facility-administered encounter medications on file as of 08/10/2016.     No Known Allergies  Review of Systems  Constitutional: Positive for malaise/fatigue. Negative for fever.  Respiratory: Negative for cough, shortness of breath and wheezing.   Cardiovascular: Negative for chest pain and palpitations.  Musculoskeletal: Positive for falls (fell 2 months ago wiuthout injury) and joint pain (finger).  Neurological: Positive for weakness.    Objective:  BP 128/60 (BP Location: Right Arm, Patient Position: Sitting, Cuff Size: Normal)   Pulse 92   Temp 98 F (36.7 C) (Oral)   Resp 14   Wt 225 lb (102.1 kg)   BMI 31.38 kg/m   Physical Exam  Constitutional: He is well-developed, well-nourished, and in no distress.  HENT:  Head: Normocephalic.  Eyes: Pupils are equal, round, and reactive to light.  Neck: Normal range of motion.  Cardiovascular: Normal rate, regular rhythm and normal heart sounds.   Pulmonary/Chest: Effort normal and breath sounds normal.  Musculoskeletal:  Mild  Induration of the medial index finger, with damage of the medial distal nail bed.    Assessment and Plan :   1. Paronychia of finger of right hand Minimally infected with no point where drainage would help.Patient would like surgical intervention for the tender nail. - Ambulatory referral to Orthopedic Surgery - amoxicillin (AMOXIL) 500 MG capsule; Take 1 capsule (500 mg total) by mouth 3 (three) times daily.  Dispense: 21 capsule; Refill: 0 2.Parkinsosns Disease 3.Adjustment Reaction Patient mourning the death of his wife. HPI, Exam and A&P Transcribed under the direction and in the presence of Miguel Aschoff, Brooke Bonito., MD. Electronically Signed: Althea Charon, RMA I have done the exam and reviewed the chart and it is accurate to the best of my knowledge. Risk manager has been used and  any errors in dictation or transcription are unintentional. Miguel Aschoff M.D. [facility]

## 2016-08-11 DIAGNOSIS — L03011 Cellulitis of right finger: Secondary | ICD-10-CM | POA: Diagnosis not present

## 2016-08-19 DIAGNOSIS — G20 Parkinson's disease: Secondary | ICD-10-CM | POA: Diagnosis not present

## 2016-08-19 DIAGNOSIS — G2581 Restless legs syndrome: Secondary | ICD-10-CM | POA: Diagnosis not present

## 2016-08-19 DIAGNOSIS — F4321 Adjustment disorder with depressed mood: Secondary | ICD-10-CM | POA: Diagnosis not present

## 2016-09-05 ENCOUNTER — Other Ambulatory Visit: Payer: Self-pay | Admitting: Family Medicine

## 2016-09-07 ENCOUNTER — Other Ambulatory Visit: Payer: PPO

## 2016-09-07 DIAGNOSIS — N200 Calculus of kidney: Secondary | ICD-10-CM | POA: Diagnosis not present

## 2016-09-07 NOTE — Telephone Encounter (Signed)
Please review for Dr [email or web address]-aa 

## 2016-09-10 ENCOUNTER — Telehealth: Payer: Self-pay | Admitting: Family Medicine

## 2016-09-10 ENCOUNTER — Other Ambulatory Visit: Payer: Self-pay | Admitting: Urology

## 2016-09-10 NOTE — Telephone Encounter (Signed)
Called Pt to schedule AWV with [facility] - knb °

## 2016-09-23 ENCOUNTER — Encounter: Payer: Self-pay | Admitting: Urology

## 2016-09-23 ENCOUNTER — Ambulatory Visit (INDEPENDENT_AMBULATORY_CARE_PROVIDER_SITE_OTHER): Payer: PPO | Admitting: Family Medicine

## 2016-09-23 ENCOUNTER — Ambulatory Visit
Admission: RE | Admit: 2016-09-23 | Discharge: 2016-09-23 | Disposition: A | Discharge status: Home / Self Care | Payer: PPO | Source: Ambulatory Visit | Attending: Urology | Admitting: Urology

## 2016-09-23 ENCOUNTER — Encounter: Payer: Self-pay | Admitting: Family Medicine

## 2016-09-23 ENCOUNTER — Ambulatory Visit: Payer: PPO | Admitting: Urology

## 2016-09-23 VITALS — BP 118/72 | HR 80 | Temp 97.5°F | Resp 16 | Wt 221.0 lb

## 2016-09-23 VITALS — BP 111/68 | HR 86 | Ht 71.0 in | Wt 221.0 lb

## 2016-09-23 DIAGNOSIS — R1011 Right upper quadrant pain: Secondary | ICD-10-CM

## 2016-09-23 DIAGNOSIS — N393 Stress incontinence (female) (male): Secondary | ICD-10-CM | POA: Diagnosis not present

## 2016-09-23 DIAGNOSIS — N3281 Overactive bladder: Secondary | ICD-10-CM | POA: Diagnosis not present

## 2016-09-23 DIAGNOSIS — N529 Male erectile dysfunction, unspecified: Secondary | ICD-10-CM | POA: Diagnosis not present

## 2016-09-23 DIAGNOSIS — Z87442 Personal history of urinary calculi: Secondary | ICD-10-CM

## 2016-09-23 DIAGNOSIS — Z8546 Personal history of malignant neoplasm of prostate: Secondary | ICD-10-CM

## 2016-09-23 DIAGNOSIS — C61 Malignant neoplasm of prostate: Secondary | ICD-10-CM

## 2016-09-23 NOTE — Progress Notes (Signed)
8:25 PM  09/23/16  Roger Rodriguez March 06, 1941 [id number]  Referring provider: [name]., MD [address] [place], [place] [address]  Chief Complaint  Patient presents with  . Nephrolithiasis    51month w/KUB    HPI:  76 year old male with multiple medical issues who returns today for follow-up with KUB, 24 hour urine metabolic work up, PSA.    Prostate cancer/ SUI Patient has a personal history of prostate cancer and underwent open radical prostatectomy in March 2006 by Dr. Quillian Quince.  Pathology unknown.  NED.   Most recent PSA <0.1 on 10/02/15.   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   PSA repeated today.    Bladder overactivity /urinary frequency Patient has significant urinary symptoms today including urinary frequency and SUI.  He does have trouble starting his stream and has to sit to void.   He is on Mybetriq 25 mg which helps.   He does have a history of Parkinson disease.  Hematuria Microsocpic hematuria times multiple. Status post CT urogram 4/17.  Cysto negative.     History of kidney stones He does have a personal history of kidney stones.  He has had several ESWL in the past. CT urogram on 10/2015 showedevidence of left distal ureteral calculi, measuring up to 8 mm. He also has nonobstructing calculi bilaterally. S/p L URS, LL on 11/2015 and R URS, LL on 01/13/16 Follow up RUS 02/20/16 without stones or hydronephrosis, mild fullness of left collecting system. Stone analysis Ca oxalate monohydrate 80%, Ca oxalate dihydrate 10%, Ca phosphate 5% and uric acid 5%.  24 hour urine today shows adequate urinary volume to 2.48 L.  Urine Calcium is borderline elevated at 269 mg/day.  He also was noted to have borderline hyperoxaluria ( 44 mg/day).  On the day of the study, he reports that he tried to do a good job and drink more fluid than he typically does.  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  He has tried Viagra along  with other PDE 5 inhibitors not affected. He has previously tried alprostadil injections with fairly decent success although finds these to be too expensive.   He has tried trimix   Using 1 mL trimix with ~50-80 % erection but only last 20-25 min.  He would like trying to up titrate the dose to help facilitate a more satisfactory response.       IPSS    Row Name 09/23/16 1100         International Prostate Symptom Score   How often have you had the sensation of not emptying your bladder? More than half the time     How often have you had to urinate less than every two hours? Almost always     How often have you found you stopped and started again several times when you urinated? Almost always     How often have you found it difficult to postpone urination? Less than half the time     How often have you had a weak urinary stream? About half the time     How often have you had to strain to start urination? More than half the time     How many times did you typically get up at night to urinate? 5 Times     Total IPSS Score 28       Quality of Life due to urinary symptoms   If you were  to spend the rest of your life with your urinary condition just the way it is now how would you feel about that? Mixed        Score:  1-7 Mild 8-19 Moderate 20-35 Severe   PMH: Past Medical History:  Diagnosis Date  . Allergy    allergic rhinitis  . Arthritis    OA  . Chronic kidney disease    stones  . Depression   . Family history of adverse reaction to anesthesia   . Hematuria   . History of kidney stones   . Hyperlipidemia   . Hypertension    no meds since parkinsons meds started  . Incontinence of urine   . Inguinal hernia   . Neuromuscular disorder ([place])    parkinsons  . Overactive bladder   . Parkinson's disease ([place])   . Prostate cancer ([place])   . Rhinitis, allergic   . Shortness of breath dyspnea    doe secondary to parkinsons    Surgical History: Past Surgical History:   Procedure Laterality Date  . BACK SURGERY  2014  . CYSTOSCOPY WITH STENT PLACEMENT Right 01/13/2016   Procedure: CYSTOSCOPY WITH STENT PLACEMENT;  Surgeon: Hollice Espy, MD;  Location: [facility];  Service: Urology;  Laterality: Right;  . CYSTOSCOPY/URETEROSCOPY/HOLMIUM LASER/STENT PLACEMENT Left 11/20/2015   Procedure: CYSTOSCOPY/URETEROSCOPY/HOLMIUM LASER/STENT PLACEMENT;  Surgeon: Hollice Espy, MD;  Location: [facility];  Service: Urology;  Laterality: Left;  . EYE SURGERY    . FRACTURE SURGERY      ankle   . H/O arthroscopy of right knee  2015  . H/O radical protatectomy    . HERNIA REPAIR    . JOINT REPLACEMENT     right knee  . LITHOTRIPSY  2010  . Rotator Cuff tear  1993   Repaired  . URETEROSCOPY WITH HOLMIUM LASER LITHOTRIPSY  2002?  [name] URETEROSCOPY WITH HOLMIUM LASER LITHOTRIPSY Right 01/13/2016   Procedure: URETEROSCOPY WITH HOLMIUM LASER LITHOTRIPSY;  Surgeon: Hollice Espy, MD;  Location: [facility];  Service: Urology;  Laterality: Right;  [name] VASECTOMY     History    Home Medications:  Allergies as of 09/23/2016   No Known Allergies     Medication List       Accurate as of 09/23/16  8:25 PM. Always use your most recent med list.          budesonide 32 MCG/ACT nasal spray Commonly known as:  RHINOCORT AQUA USE 1 PUFF IN EACH NOSTRIL EVERY DAY AS NEEDED   carbidopa-levodopa 50-200 MG tablet Commonly known as:  SINEMET CR TAKE 2 TABLET BY MOUTH 4 (four) TIMES DAILY.   celecoxib 200 MG capsule Commonly known as:  CELEBREX Take 1 capsule (200 mg total) by mouth 2 (two) times daily.   erythromycin ophthalmic ointment Place 1 application into both eyes at bedtime.   gabapentin 400 MG capsule Commonly known as:  NEURONTIN Take 400 mg by mouth 3 (three) times daily.   hydrochlorothiazide 12.5 MG tablet Commonly known as:  HYDRODIURIL Take 1 tablet (12.5 mg total) by mouth daily.   lisinopril 2.5 MG tablet Commonly known as:  PRINIVIL,ZESTRIL Take 1 tablet  (2.5 mg total) by mouth 2 (two) times daily.   loratadine 10 MG tablet Commonly known as:  CLARITIN Take 10 mg by mouth daily.   mirabegron ER 25 MG Tb24 tablet Commonly known as:  MYRBETRIQ Take 1 tablet (25 mg total) by mouth 2 (two) times daily.   rOPINIRole 0.5 MG tablet Commonly known  as:  REQUIP Take 0.5 mg by mouth 3 (three) times daily.   vitamin C 500 MG tablet Commonly known as:  ASCORBIC ACID Take 500 mg by mouth daily.   Vitamin D 2000 units tablet Take 2,000 Units by mouth daily.   VITAMIN E NATURAL PO Take 1 capsule by mouth daily.       Allergies: No Known Allergies  Family History: Family History  Problem Relation Age of Onset  . Dementia Mother   . Cerebral aneurysm Father   . Bladder Cancer Paternal Uncle   . Liver cancer Sister   . Prostate cancer Neg Hx   . Kidney cancer Neg Hx     Social History:  reports that he quit smoking about 37 years ago. His smoking use included Cigars. He quit smokeless tobacco use about 12 years ago. His smokeless tobacco use included Chew. He reports that he drinks alcohol. He reports that he does not use drugs.  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  Physical Exam: BP 111/68   Pulse 86   Ht 5\' 11"  (1.803 m)   Wt 221 lb (100.2 kg)   BMI 30.82 kg/m   Constitutional:  Alert and oriented, No acute distress. HEENT: [place] AT, moist mucus membranes.  Trachea midline, no masses. Cardiovascular: No clubbing, cyanosis, or edema.  Respiratory: Normal respiratory effort, no increased work of breathing. GI: Abdomen is soft, nontender, nondistended, no abdominal masses GU: No CVA tenderness.  Skin: No rashes, bruises or suspicious lesions. Neurologic: Grossly intact, no focal deficits, moving all 4 extremities.  Mild resting tremor noted. Ambulates with cane. Psychiatric: Normal mood and affect.  Laboratory Data: Lab Results    Component Value Date   WBC 6.2 06/19/2014   HGB 14.4 06/19/2014   HCT 43 06/19/2014   MCV 95 04/03/2014   PLT 253 06/19/2014    Lab Results  Component Value Date   CREATININE 1.02 01/07/2016    PSA as above  Lab Results  Component Value Date   HGBA1C 6.0 04/02/2016    Pertinent Imaging: CLINICAL DATA:  History of renal stones  EXAM: ABDOMEN - 1 VIEW  COMPARISON:  01/03/2016  FINDINGS: Scattered large and small bowel gas is noted. Fecal material is noted throughout the colon. The previously seen right lower pole renal calculus is not well appreciated on this exam. Calcification to the right of the midline is also no longer identified. Multiple phleboliths are seen within the pelvis. Degenerative and postoperative changes of the lumbar spine are again seen.  IMPRESSION: No definitive calculi identified.   Electronically Signed   By: Inez Catalina M.D.   On: 09/23/2016 08:49  KUB personally reviewed today with the patient.  Assessment & Plan:    1.  History of kidney stones Multiple recent stone episodes KUB today free of stones 24 urine metabolic workup reviewed and swallow stone analysis Dietary precautions were reviewed again today extensively, literature given Recommendations for continued increase in oral intake of fluid recommended, already on hydrochlorothiazide  2. Malignant neoplasm of prostate [facility]), history of S/p prostatectomy 2006, NED.  PSA undetectable. PSA repeated today, recommend annually  3. OAB (overactive bladder) Continue  Mybetriq 25mg , doing well Concern today about cost of this medication and doughnut hole, 1 month samples given and will arrange to help supply this for him  4. Urinary incontinence, stress, male Wears depends.  5. Microscopic hematuria S/p negative hematuria work in  2017, stones treated  6. ED Currently using trimix (standard dose) 1 mg with some but not ideal response Will increase to "super trimix"  and start with 0.5 mL, possibly titrate up to 1 mL if no response    Return in about 6 months (around 03/26/2017) for symptoms recheck, PVR.   Hollice Espy, MD  [facility] [address] [place], [place] [address] [phone]

## 2016-09-23 NOTE — Progress Notes (Signed)
Patient: Roger Rodriguez Male    DOB: [birth year]   76 y.o.   MRN: [id number] Visit Date: 09/23/2016  Today's Provider: Lelon Huh, MD   Chief Complaint  Patient presents with  . burning sensation on chest   Subjective:    HPI Patient comes in today c/o a burning sensation in the middle of his chest. The patient can pinpoint the area where the burning is located, which is about 4cm right of midline at level of epigastrium. Patient states, "it feels like the end of a cigarette is poking me in my chest." Patient reports that the sensation is constant, and it has been going on for at least 3 weeks. At first he thought it was due to new exercise program he started a few weeks.  Pain has been constant, but waxes  And wanes for the last three weeks. No change in pain when eating or drinking. No relation to position or movement. Feels like it is below skin, not on surface.      No Known Allergies   Current Outpatient Prescriptions:  .  budesonide (RHINOCORT AQUA) 32 MCG/ACT nasal spray, USE 1 PUFF IN EACH NOSTRIL EVERY DAY AS NEEDED, Disp: 16 g, Rfl: 5 .  carbidopa-levodopa (SINEMET CR) 50-200 MG per tablet, TAKE 2 TABLET BY MOUTH 4 (four) TIMES DAILY., Disp: , Rfl: 4 .  celecoxib (CELEBREX) 200 MG capsule, Take 1 capsule (200 mg total) by mouth 2 (two) times daily., Disp: 30 capsule, Rfl: 0 .  Cholecalciferol (VITAMIN D) 2000 UNITS tablet, Take 2,000 Units by mouth daily. , Disp: , Rfl:  .  erythromycin ophthalmic ointment, Place 1 application into both eyes at bedtime. , Disp: , Rfl:  .  gabapentin (NEURONTIN) 400 MG capsule, Take 400 mg by mouth 3 (three) times daily. , Disp: , Rfl: 2 .  hydrochlorothiazide (HYDRODIURIL) 12.5 MG tablet, Take 1 tablet (12.5 mg total) by mouth daily., Disp: 30 tablet, Rfl: 12 .  lisinopril (PRINIVIL,ZESTRIL) 2.5 MG tablet, Take 1 tablet (2.5 mg total) by mouth 2 (two) times daily., Disp: 180 tablet, Rfl: 3 .  loratadine (CLARITIN) 10 MG tablet, Take  10 mg by mouth daily., Disp: , Rfl:  .  mirabegron ER (MYRBETRIQ) 25 MG TB24 tablet, Take 1 tablet (25 mg total) by mouth 2 (two) times daily., Disp: 60 tablet, Rfl: 3 .  rOPINIRole (REQUIP) 0.5 MG tablet, Take 0.5 mg by mouth 3 (three) times daily. , Disp: , Rfl:  .  vitamin C (ASCORBIC ACID) 500 MG tablet, Take 500 mg by mouth daily., Disp: , Rfl:  .  VITAMIN E NATURAL PO, Take 1 capsule by mouth daily. , Disp: , Rfl:   Review of Systems  Constitutional: Negative for activity change, appetite change, chills, diaphoresis, fatigue, fever and unexpected weight change.  Respiratory: Negative for apnea, cough, choking, chest tightness, shortness of breath, wheezing and stridor.   Cardiovascular: Negative for chest pain, palpitations and leg swelling.  Skin: Negative for color change, pallor, rash and wound.    Social History  Substance Use Topics  . Smoking status: Former Smoker    Types: Cigars    Quit date: 11/12/1978  . Smokeless tobacco: Former Systems developer    Types: [place] date: 11/12/2003     Comment: Occassionally, also chewed tobacco till 2005. Quit smoking 1973  . Alcohol use 0.0 oz/week     Comment: occasional   Objective:   BP 118/72 (BP Location: Left  Arm, Patient Position: Sitting, Cuff Size: Normal)   Pulse 80   Temp 97.5 F (36.4 C)   Resp 16   Wt 221 lb (100.2 kg)   BMI 30.82 kg/m  Vitals:   09/23/16 1046  BP: 118/72  Pulse: 80  Resp: 16  Temp: 97.5 F (36.4 C)  Weight: 221 lb (100.2 kg)     Physical Exam  General Appearance:    Alert, cooperative, no distress  Eyes:    PERRL, conjunctiva/corneas clear, EOM's intact       Lungs:     Clear to auscultation bilaterally, respirations unlabored  Heart:    Regular rate and rhythm  Skin:   No Rashes or discoloration of skin at location of symptoms   Abdomen:   bowel sounds present and normal in all 4 quadrants, soft, round, nontender or nondistended. No CVA tenderness        Assessment & Plan:     1. RUQ  pain Vague, non-specific pain for 3 weeks. Normal exam.   - US Abdomen Limited RUQ; Future       Lelon Huh, MD  [facility]

## 2016-09-24 ENCOUNTER — Ambulatory Visit: Payer: Self-pay | Admitting: Family Medicine

## 2016-09-24 ENCOUNTER — Telehealth: Payer: Self-pay

## 2016-09-24 LAB — PSA: Prostate Specific Ag, Serum: <0.1 ng/mL (ref 0.0–4.0)

## 2016-09-24 NOTE — Telephone Encounter (Signed)
Spoke with pt in reference to PSA results. Pt voiced understanding.  

## 2016-09-24 NOTE — Telephone Encounter (Signed)
-----   Message from Hollice Espy, MD sent at 09/24/2016  9:43 AM EDT ----- PSA is undetectable!  Great news.    Hollice Espy, MD

## 2016-09-24 NOTE — Telephone Encounter (Signed)
LMOM

## 2016-09-28 ENCOUNTER — Telehealth: Payer: Self-pay

## 2016-09-28 ENCOUNTER — Encounter: Payer: Self-pay | Admitting: Family Medicine

## 2016-09-28 ENCOUNTER — Ambulatory Visit
Admission: RE | Admit: 2016-09-28 | Discharge: 2016-09-28 | Disposition: A | Discharge status: Home / Self Care | Payer: PPO | Source: Ambulatory Visit | Attending: Family Medicine | Admitting: Family Medicine

## 2016-09-28 DIAGNOSIS — N281 Cyst of kidney, acquired: Secondary | ICD-10-CM | POA: Insufficient documentation

## 2016-09-28 DIAGNOSIS — R1011 Right upper quadrant pain: Secondary | ICD-10-CM | POA: Diagnosis not present

## 2016-09-28 DIAGNOSIS — K760 Fatty (change of) liver, not elsewhere classified: Secondary | ICD-10-CM | POA: Insufficient documentation

## 2016-09-28 NOTE — Telephone Encounter (Signed)
-----   Message from Birdie Sons, MD sent at 09/28/2016  1:03 PM EDT ----- u/s shows large benign cyst on right kidney. No treatment is needed for this. Also shows fatty liver, but liver is not enlarged so this should not cause any pain or discomfort. Otherwise u/s is normal. The pain is most likely from pulled muscle in upper abdomen and should resolve in 3-4 more weeks.

## 2016-09-28 NOTE — Telephone Encounter (Signed)
Left message to call back  

## 2016-09-29 NOTE — Telephone Encounter (Signed)
Pt is returning call.  CB#[phone]/MW

## 2016-09-29 NOTE — Telephone Encounter (Signed)
Advised patient as below.  

## 2016-10-01 ENCOUNTER — Ambulatory Visit: Payer: PPO | Admitting: Family Medicine

## 2016-10-07 ENCOUNTER — Other Ambulatory Visit: Payer: Self-pay | Admitting: Family Medicine

## 2016-10-22 ENCOUNTER — Ambulatory Visit (INDEPENDENT_AMBULATORY_CARE_PROVIDER_SITE_OTHER): Payer: PPO

## 2016-10-22 ENCOUNTER — Ambulatory Visit
Admission: RE | Admit: 2016-10-22 | Discharge: 2016-10-22 | Disposition: A | Discharge status: Home / Self Care | Payer: PPO | Source: Ambulatory Visit | Attending: Family Medicine | Admitting: Family Medicine

## 2016-10-22 ENCOUNTER — Ambulatory Visit (INDEPENDENT_AMBULATORY_CARE_PROVIDER_SITE_OTHER): Payer: PPO | Admitting: Family Medicine

## 2016-10-22 VITALS — BP 138/88 | HR 72 | Temp 98.0°F | Wt 233.0 lb

## 2016-10-22 VITALS — BP 152/92 | HR 72 | Temp 98.0°F | Ht 71.0 in | Wt 233.4 lb

## 2016-10-22 DIAGNOSIS — R5383 Other fatigue: Secondary | ICD-10-CM | POA: Diagnosis not present

## 2016-10-22 DIAGNOSIS — G20 Parkinson's disease: Secondary | ICD-10-CM

## 2016-10-22 DIAGNOSIS — R079 Chest pain, unspecified: Secondary | ICD-10-CM

## 2016-10-22 DIAGNOSIS — I10 Essential (primary) hypertension: Secondary | ICD-10-CM | POA: Diagnosis not present

## 2016-10-22 DIAGNOSIS — R1011 Right upper quadrant pain: Secondary | ICD-10-CM

## 2016-10-22 DIAGNOSIS — M1990 Unspecified osteoarthritis, unspecified site: Secondary | ICD-10-CM

## 2016-10-22 DIAGNOSIS — Z23 Encounter for immunization: Secondary | ICD-10-CM

## 2016-10-22 DIAGNOSIS — E7800 Pure hypercholesterolemia, unspecified: Secondary | ICD-10-CM | POA: Diagnosis not present

## 2016-10-22 DIAGNOSIS — F321 Major depressive disorder, single episode, moderate: Secondary | ICD-10-CM | POA: Diagnosis not present

## 2016-10-22 DIAGNOSIS — Z0000 Encounter for general adult medical examination without abnormal findings: Secondary | ICD-10-CM | POA: Diagnosis not present

## 2016-10-22 MED ORDER — CELECOXIB 200 MG PO CAPS: 200.0000 mg | ORAL_CAPSULE | Freq: Two times a day (BID) | ORAL | 3 refills | Status: DC

## 2016-10-22 NOTE — Progress Notes (Signed)
Patient: Roger Rodriguez Male    DOB: 1940-11-08   76 y.o.   MRN: [id number] Visit Date: 10/22/2016  Today's Provider: Wilhemena Durie, MD   Chief Complaint  Patient presents with  . Hypertension  . Hyperlipidemia   Subjective:    HPI   Pt is here to follow up on his AWV with the health nurse advisor.    Hypertension, follow-up:  BP Readings from Last 3 Encounters:  10/22/16 138/88  10/22/16 (!) 152/92  09/23/16 118/72    He was last seen for hypertension 7 months ago.  BP at that visit was 132/80. Management since that visit includes none. He reports excellent compliance with treatment. He is not having side effects.  He is exercising. Goes to a gym for Parkinson's. He is adherent to low salt diet.   Outside blood pressures are 120's/60's-190's/100. Higher readings are usually in the afternoons. Pt reports he takes Lisinopril 2.5 mg po BID, and he is taking HCTZ 12.5 mg once daily. Pt reports his Sinemet causes BP to elevate. He is experiencing fatigue and lower extremity edema.  Patient denies chest pain, chest pressure/discomfort, dyspnea, exertional chest pressure/discomfort, irregular heart beat, near-syncope, orthopnea, palpitations and syncope.   Cardiovascular risk factors include advanced age (older than 64 for men, 15 for women), dyslipidemia, hypertension and male gender.      Weight trend: increasing steadily Wt Readings from Last 3 Encounters:  10/22/16 233 lb (105.7 kg)  10/22/16 233 lb 6.4 oz (105.9 kg)  09/23/16 221 lb (100.2 kg)    Current diet: in general, a "healthy" diet    ------------------------------------------------------------------------  Lipid/Cholesterol, Follow-up:   Last seen for this 1 years ago.  Management changes since that visit include none. . Last Lipid Panel:    Component Value Date/Time   CHOL 190 10/02/2015 0951   TRIG 116 10/02/2015 0951   HDL 38 (L) 10/02/2015 0951   CHOLHDL 5.0 10/02/2015 0951   LDLCALC 129 (H) 10/02/2015 0951    Risk factors for vascular disease include hypercholesterolemia and hypertension  He reports excellent compliance with treatment. He is not having side effects.    ------------------------------------------------------------------- Pt also reports he has been taking Celebrex 200 mg BID "for years", but recently had a prescription for 30 capsules. Pt is requesting the full 60 capsule prescription so he won't have to pay twice. Pt also c/o RUQ "burning". Pt saw Dr. Caryn Section for this problem on 09/23/2016. [place] was ordered. Per Dr. Caryn Section, "u/s shows large benign cyst on right kidney. No treatment is needed for this. Also shows fatty liver, but liver is not enlarged so this should not cause any pain or discomfort. Otherwise u/s is normal. The pain is most likely from pulled muscle in upper abdomen and should resolve in 3-4 more weeks". Pt reports this pain is worsening from LOV.   No Known Allergies   Current Outpatient Prescriptions:  .  amoxicillin (AMOXIL) 500 MG capsule, TAKE 1 CAPSULE (500 MG TOTAL) BY MOUTH 3 (THREE) TIMES DAILY. as needed for dental work, Disp: , Rfl: 0 .  budesonide (RHINOCORT AQUA) 32 MCG/ACT nasal spray, USE 1 PUFF IN EACH NOSTRIL EVERY DAY AS NEEDED, Disp: 16 g, Rfl: 5 .  carbidopa-levodopa (SINEMET CR) 50-200 MG per tablet, TAKE 2 TABLET BY MOUTH 4 (four) TIMES DAILY., Disp: , Rfl: 4 .  celecoxib (CELEBREX) 200 MG capsule, Take 1 capsule (200 mg total) by mouth 2 (two) times daily., Disp: 30 capsule, Rfl:  0 .  Cholecalciferol (VITAMIN D) 2000 UNITS tablet, Take 1,000 Units by mouth daily. , Disp: , Rfl:  .  erythromycin ophthalmic ointment, Place 1 application into both eyes at bedtime. , Disp: , Rfl:  .  gabapentin (NEURONTIN) 400 MG capsule, Take 400 mg by mouth 3 (three) times daily. , Disp: , Rfl: 2 .  hydrochlorothiazide (HYDRODIURIL) 12.5 MG tablet, Take 1 tablet (12.5 mg total) by mouth daily., Disp: 30 tablet, Rfl: 12 .   lisinopril (PRINIVIL,ZESTRIL) 2.5 MG tablet, Take 1 tablet (2.5 mg total) by mouth 2 (two) times daily., Disp: 180 tablet, Rfl: 3 .  loratadine (CLARITIN) 10 MG tablet, Take 10 mg by mouth daily., Disp: , Rfl:  .  mirabegron ER (MYRBETRIQ) 25 MG TB24 tablet, Take 1 tablet (25 mg total) by mouth 2 (two) times daily., Disp: 60 tablet, Rfl: 3 .  rOPINIRole (REQUIP XL) 4 MG 24 hr tablet, , Disp: , Rfl:  .  rOPINIRole (REQUIP) 0.5 MG tablet, Take 0.5 mg by mouth 3 (three) times daily. , Disp: , Rfl:  .  rOPINIRole (REQUIP) 4 MG tablet, Take 4 mg by mouth at bedtime., Disp: , Rfl:  .  vitamin C (ASCORBIC ACID) 500 MG tablet, Take 500 mg by mouth daily., Disp: , Rfl:  .  VITAMIN E NATURAL PO, Take 1 capsule by mouth daily. , Disp: , Rfl:   Review of Systems  Constitutional: Positive for activity change, fatigue and unexpected weight change (gain). Negative for appetite change, chills, diaphoresis and fever.  Eyes: Negative.   Respiratory: Negative for shortness of breath.   Cardiovascular: Positive for leg swelling. Negative for chest pain and palpitations.  Gastrointestinal: Positive for abdominal pain.  Endocrine: Negative.   Allergic/Immunologic: Negative.   Neurological: Positive for tremors and weakness.  Psychiatric/Behavioral: Negative.     Social History  Substance Use Topics  . Smoking status: Former Smoker    Types: Cigars    Quit date: 11/11/1968  . Smokeless tobacco: Former Systems developer    Types: [place] date: 11/12/2003     Comment: Occassionally, also chewed tobacco till 2005. Quit smoking 1973  . Alcohol use No   Objective:   BP 138/88 (BP Location: Right Arm, Patient Position: Sitting, Cuff Size: Large)   Pulse 72   Temp 98 F (36.7 C)   Wt 233 lb (105.7 kg)   BMI 32.50 kg/m  Vitals:   10/22/16 0906  BP: 138/88  Pulse: 72  Temp: 98 F (36.7 C)  Weight: 233 lb (105.7 kg)     Physical Exam  Constitutional: He appears well-developed and well-nourished.  HENT:    Head: Normocephalic.  Eyes: Conjunctivae are normal.  Neck: Normal range of motion. Neck supple. No thyromegaly present.  Cardiovascular: Normal rate, regular rhythm and normal heart sounds.   Pulmonary/Chest: Effort normal and breath sounds normal. No respiratory distress.  Abdominal: Soft. There is tenderness (RUQ tenderness over lower rib).  Musculoskeletal: He exhibits edema (1+).  Psychiatric: He has a normal mood and affect. His behavior is normal.        Assessment & Plan:     1. Essential (primary) hypertension Will continue to follow. Higher dose of Lisinopril causes lightheadedness. FU 4 months.  2. Parkinson's disease [facility]) Continue neurology follow ups.   3. RUQ pain Advised pt to discontinue upper body exercise for 4 weeks to allow this muscle strain to heal. Will check CXR to R/O rib fx.  4. Chest pain, unspecified type  Order CXR for the rib pain. - DG Chest 2 View  5. Other fatigue Due to Parkinson's.  - TSH  6. Pure hypercholesterolemia Check labs. - CBC with Differential/Platelet - Comprehensive metabolic panel - Lipid panel  7. Arthritis Pt advised of risks of high dose, long-term NSAID use, including kidney failure. I initially refused to write the prescription for 2 times daily. Pt was adamant about having this medication for BID; states he would rather have kidney failure than arthralgias. Prescription reluctantly refilled for BID as below. - celecoxib (CELEBREX) 200 MG capsule; Take 1 capsule (200 mg total) by mouth 2 (two) times daily.  Dispense: 60 capsule; Refill: 3  8. Depression, major, single episode, moderate (HCC) Worsening. Pt refuses antidepressant. Will FU in 4 months. Recheck PHQ9 at that time.   Depression screen [facility] 2/9 10/22/2016  Decreased Interest 3  Down, Depressed, Hopeless 3  PHQ - 2 Score 6  Altered sleeping 3  Tired, decreased energy 3  Change in appetite 2  Feeling bad or failure about yourself  0  Trouble concentrating  0  Moving slowly or fidgety/restless 3  Suicidal thoughts 0  PHQ-9 Score 17  Difficult doing work/chores Extremely dIfficult    9. Need for pneumococcal vaccination Administered today. - Pneumococcal conjugate vaccine 13-valent IM     Patient seen and examined by Miguel Aschoff, MD, and note scribed by Renaldo Fiddler, CMA. I have done the exam and reviewed the above chart and it is accurate to the best of my knowledge. Development worker, community has been used in this note in any air is in the dictation or transcription are unintentional.  Wilhemena Durie, MD  [facility]

## 2016-10-22 NOTE — Progress Notes (Signed)
Advised  ED 

## 2016-10-22 NOTE — Patient Instructions (Signed)
Roger Rodriguez , Thank you for taking time to come for your Medicare Wellness Visit. I appreciate your ongoing commitment to your health goals. Please review the following plan we discussed and let me know if I can assist you in the future.   Screening recommendations/referrals: Colonoscopy: declined Recommended yearly ophthalmology/optometry visit for glaucoma screening and checkup Recommended yearly dental visit for hygiene and checkup  Vaccinations: Influenza vaccine: done 04/02/16 Pneumococcal vaccine: completed series Tdap vaccine: last done 11/30/06, due 11/29/2016 Shingles vaccine: completed per patient    Advanced directives: Requested copy  Conditions/risks identified: fall risk prevention  Next appointment: None  Preventive Care 23 Years and Older, Male Preventive care refers to lifestyle choices and visits with your health care provider that can promote health and wellness. What does preventive care include?  A yearly physical exam. This is also called an annual well check.  Dental exams once or twice a year.  Routine eye exams. Ask your health care provider how often you should have your eyes checked.  Personal lifestyle choices, including:  Daily care of your teeth and gums.  Regular physical activity.  Eating a healthy diet.  Avoiding tobacco and drug use.  Limiting alcohol use.  Practicing safe sex.  Taking low doses of aspirin every day.  Taking vitamin and mineral supplements as recommended by your health care provider. What happens during an annual well check? The services and screenings done by your health care provider during your annual well check will depend on your age, overall health, lifestyle risk factors, and family history of disease. Counseling  Your health care provider may ask you questions about your:  Alcohol use.  Tobacco use.  Drug use.  Emotional well-being.  Home and relationship well-being.  Sexual activity.  Eating  habits.  History of falls.  Memory and ability to understand (cognition).  Work and work Statistician. Screening  You may have the following tests or measurements:  Height, weight, and BMI.  Blood pressure.  Lipid and cholesterol levels. These may be checked every 5 years, or more frequently if you are over 32 years old.  Skin check.  Lung cancer screening. You may have this screening every year starting at age 43 if you have a 30-pack-year history of smoking and currently smoke or have quit within the past 15 years.  Fecal occult blood test (FOBT) of the stool. You may have this test every year starting at age 1.  Flexible sigmoidoscopy or colonoscopy. You may have a sigmoidoscopy every 5 years or a colonoscopy every 10 years starting at age 71.  Prostate cancer screening. Recommendations will vary depending on your family history and other risks.  Hepatitis C blood test.  Hepatitis B blood test.  Sexually transmitted disease (STD) testing.  Diabetes screening. This is done by checking your blood sugar (glucose) after you have not eaten for a while (fasting). You may have this done every 1-3 years.  Abdominal aortic aneurysm (AAA) screening. You may need this if you are a current or former smoker.  Osteoporosis. You may be screened starting at age 54 if you are at high risk. Talk with your health care provider about your test results, treatment options, and if necessary, the need for more tests. Vaccines  Your health care provider may recommend certain vaccines, such as:  Influenza vaccine. This is recommended every year.  Tetanus, diphtheria, and acellular pertussis (Tdap, Td) vaccine. You may need a Td booster every 10 years.  Zoster vaccine. You may need this  after age 68.  Pneumococcal 13-valent conjugate (PCV13) vaccine. One dose is recommended after age 7.  Pneumococcal polysaccharide (PPSV23) vaccine. One dose is recommended after age 27. Talk to your health  care provider about which screenings and vaccines you need and how often you need them. This information is not intended to replace advice given to you by your health care provider. Make sure you discuss any questions you have with your health care provider. Document Released: 07/26/2015 Document Revised: 03/18/2016 Document Reviewed: 04/30/2015 Elsevier Interactive Patient Education  2017 [place] Prevention in the Home Falls can cause injuries. They can happen to people of all ages. There are many things you can do to make your home safe and to help prevent falls. What can I do on the outside of my home?  Regularly fix the edges of walkways and driveways and fix any cracks.  Remove anything that might make you trip as you walk through a door, such as a raised step or threshold.  Trim any bushes or trees on the path to your home.  Use bright outdoor lighting.  Clear any walking paths of anything that might make someone trip, such as rocks or tools.  Regularly check to see if handrails are loose or broken. Make sure that both sides of any steps have handrails.  Any raised decks and porches should have guardrails on the edges.  Have any leaves, snow, or ice cleared regularly.  Use sand or salt on walking paths during winter.  Clean up any spills in your garage right away. This includes oil or grease spills. What can I do in the bathroom?  Use night lights.  Install grab bars by the toilet and in the tub and shower. Do not use towel bars as grab bars.  Use non-skid mats or decals in the tub or shower.  If you need to sit down in the shower, use a plastic, non-slip stool.  Keep the floor dry. Clean up any water that spills on the floor as soon as it happens.  Remove soap buildup in the tub or shower regularly.  Attach bath mats securely with double-sided non-slip rug tape.  Do not have throw rugs and other things on the floor that can make you trip. What can I do  in the bedroom?  Use night lights.  Make sure that you have a light by your bed that is easy to reach.  Do not use any sheets or blankets that are too big for your bed. They should not hang down onto the floor.  Have a firm chair that has side arms. You can use this for support while you get dressed.  Do not have throw rugs and other things on the floor that can make you trip. What can I do in the kitchen?  Clean up any spills right away.  Avoid walking on wet floors.  Keep items that you use a lot in easy-to-reach places.  If you need to reach something above you, use a strong step stool that has a grab bar.  Keep electrical cords out of the way.  Do not use floor polish or wax that makes floors slippery. If you must use wax, use non-skid floor wax.  Do not have throw rugs and other things on the floor that can make you trip. What can I do with my stairs?  Do not leave any items on the stairs.  Make sure that there are handrails on both sides of the  stairs and use them. Fix handrails that are broken or loose. Make sure that handrails are as long as the stairways.  Check any carpeting to make sure that it is firmly attached to the stairs. Fix any carpet that is loose or worn.  Avoid having throw rugs at the top or bottom of the stairs. If you do have throw rugs, attach them to the floor with carpet tape.  Make sure that you have a light switch at the top of the stairs and the bottom of the stairs. If you do not have them, ask someone to add them for you. What else can I do to help prevent falls?  Wear shoes that:  Do not have high heels.  Have rubber bottoms.  Are comfortable and fit you well.  Are closed at the toe. Do not wear sandals.  If you use a stepladder:  Make sure that it is fully opened. Do not climb a closed stepladder.  Make sure that both sides of the stepladder are locked into place.  Ask someone to hold it for you, if possible.  Clearly mark  and make sure that you can see:  Any grab bars or handrails.  First and last steps.  Where the edge of each step is.  Use tools that help you move around (mobility aids) if they are needed. These include:  Canes.  Walkers.  Scooters.  Crutches.  Turn on the lights when you go into a dark area. Replace any light bulbs as soon as they burn out.  Set up your furniture so you have a clear path. Avoid moving your furniture around.  If any of your floors are uneven, fix them.  If there are any pets around you, be aware of where they are.  Review your medicines with your doctor. Some medicines can make you feel dizzy. This can increase your chance of falling. Ask your doctor what other things that you can do to help prevent falls. This information is not intended to replace advice given to you by your health care provider. Make sure you discuss any questions you have with your health care provider. Document Released: 04/25/2009 Document Revised: 12/05/2015 Document Reviewed: 08/03/2014 Elsevier Interactive Patient Education  2017 [employer].

## 2016-10-22 NOTE — Progress Notes (Signed)
Subjective:   Roger Rodriguez is a 76 y.o. male who presents for Medicare Annual/Subsequent preventive examination.  Review of Systems:   N/A  Cardiac Risk Factors include: advanced age (>14men, >78 women);dyslipidemia;hypertension;male gender;obesity (BMI >30kg/m2)     Objective:    Vitals: BP (!) 152/92 (BP Location: Right Arm)   Pulse 72   Temp 98 F (36.7 C) (Oral)   Ht 5\' 11"  (1.803 m)   Wt 233 lb 6.4 oz (105.9 kg)   BMI 32.55 kg/m   Body mass index is 32.55 kg/m.  Tobacco History  Smoking Status  . Former Smoker  . Types: Cigars  . Quit date: 11/11/1968  Smokeless Tobacco  . Former Systems developer  . Types: Chew  . Quit date: 11/12/2003    Comment: Occassionally, also chewed tobacco till 2005. Quit smoking 1973     Counseling given: Not Answered   Past Medical History:  Diagnosis Date  . Allergy    allergic rhinitis  . Arthritis    OA  . Chronic kidney disease    stones  . Depression   . Family history of adverse reaction to anesthesia   . Hematuria   . History of kidney stones   . Hyperlipidemia   . Hypertension    no meds since parkinsons meds started  . Incontinence of urine   . Inguinal hernia   . Neuromuscular disorder ([place])    parkinsons  . Overactive bladder   . Parkinson's disease ([place])   . Prostate cancer ([place])   . Rhinitis, allergic   . Shortness of breath dyspnea    doe secondary to parkinsons   Past Surgical History:  Procedure Laterality Date  . BACK SURGERY  2014  . CYSTOSCOPY WITH STENT PLACEMENT Right 01/13/2016   Procedure: CYSTOSCOPY WITH STENT PLACEMENT;  Surgeon: Hollice Espy, MD;  Location: [facility] ORS;  Service: Urology;  Laterality: Right;  . CYSTOSCOPY/URETEROSCOPY/HOLMIUM LASER/STENT PLACEMENT Left 11/20/2015   Procedure: CYSTOSCOPY/URETEROSCOPY/HOLMIUM LASER/STENT PLACEMENT;  Surgeon: Hollice Espy, MD;  Location: [facility] OR[facility];  Service: Urology;  Laterality: Left;  . EYE SURGERY    . FRACTURE SURGERY      ankle   . H/O arthroscopy  of right knee  2015  . H/O radical protatectomy    . HERNIA REPAIR    . JOINT REPLACEMENT     right knee  . LITHOTRIPSY  2010  . Rotator Cuff tear  1993   Repaired  . URETEROSCOPY WITH HOLMIUM LASER LITHOTRIPSY  2002?  [name] URETEROSCOPY WITH HOLMIUM LASER LITHOTRIPSY Right 01/13/2016   Procedure: URETEROSCOPY WITH HOLMIUM LASER LITHOTRIPSY;  Surgeon: Hollice Espy, MD;  Location: [facility] OR[facility];  Service: Urology;  Laterality: Right;  [name] VASECTOMY     History   Family History  Problem Relation Age of Onset  . Dementia Mother   . Cerebral aneurysm Father   . Bladder Cancer Paternal Uncle   . Liver cancer Sister   . Prostate cancer Neg Hx   . Kidney cancer Neg Hx    History  Sexual Activity  . Sexual activity: No    Outpatient Encounter Prescriptions as of 10/22/2016  Medication Sig  . amoxicillin (AMOXIL) 500 MG capsule TAKE 1 CAPSULE (500 MG TOTAL) BY MOUTH 3 (THREE) TIMES DAILY. as needed for dental work  . budesonide (RHINOCORT AQUA) 32 MCG/ACT nasal spray USE 1 PUFF IN EACH NOSTRIL EVERY DAY AS NEEDED  . carbidopa-levodopa (SINEMET CR) 50-200 MG per tablet TAKE 2 TABLET BY MOUTH 4 (four) TIMES DAILY.  [name]  celecoxib (CELEBREX) 200 MG capsule Take 1 capsule (200 mg total) by mouth 2 (two) times daily.  . Cholecalciferol (VITAMIN D) 2000 UNITS tablet Take 1,000 Units by mouth daily.   [name] erythromycin ophthalmic ointment Place 1 application into both eyes at bedtime.   . gabapentin (NEURONTIN) 400 MG capsule Take 400 mg by mouth 3 (three) times daily.   . hydrochlorothiazide (HYDRODIURIL) 12.5 MG tablet Take 1 tablet (12.5 mg total) by mouth daily.  [name] lisinopril (PRINIVIL,ZESTRIL) 2.5 MG tablet Take 1 tablet (2.5 mg total) by mouth 2 (two) times daily.  [name] loratadine (CLARITIN) 10 MG tablet Take 10 mg by mouth daily.  . mirabegron ER (MYRBETRIQ) 25 MG TB24 tablet Take 1 tablet (25 mg total) by mouth 2 (two) times daily.  [name] rOPINIRole (REQUIP XL) 4 MG 24 hr tablet   . vitamin C (ASCORBIC  ACID) 500 MG tablet Take 500 mg by mouth daily.  [name] VITAMIN E NATURAL PO Take 1 capsule by mouth daily.   . celecoxib (CELEBREX) 200 MG capsule Take 1 capsule (200 mg total) by mouth daily. (Patient not taking: Reported on 10/22/2016)  . rOPINIRole (REQUIP) 0.5 MG tablet Take 0.5 mg by mouth 3 (three) times daily.   [name] rOPINIRole (REQUIP) 4 MG tablet Take 4 mg by mouth at bedtime.   No facility-administered encounter medications on file as of 10/22/2016.     Activities of Daily Living In your present state of health, do you have any difficulty performing the following activities: 10/22/2016 01/06/2016  Hearing? N -  Vision? N -  Difficulty concentrating or making decisions? N -  Walking or climbing stairs? Y -  Dressing or bathing? Y -  Doing errands, shopping? Y N  Preparing Food and eating ? N -  Using the Toilet? N -  In the past six months, have you accidently leaked urine? Y -  Do you have problems with loss of bowel control? N -  Managing your Medications? N -  Managing your Finances? N -  Housekeeping or managing your Housekeeping? N -  Some recent data might be hidden    Patient Care Team: [name]., MD as PCP - General (Family Medicine) Hollice Espy, MD as Consulting Physician (Urology) Vladimir Crofts, MD as Consulting Physician (Neurology) Leandrew Koyanagi, MD as Referring Physician (Ophthalmology)   Assessment:     Exercise Activities and Dietary recommendations Current Exercise Habits: Structured exercise class (program rock-steady ), Type of exercise: strength training/weights;stretching;walking, Time (Minutes): > 60, Frequency (Times/Week): 3, Weekly Exercise (Minutes/Week): 0, Intensity: Mild, Exercise limited by: Other - see comments (Parkinsons)  Goals    . Increase water intake          Recommend increasing water intake to 4-5 glasses a day.       Fall Risk Fall Risk  10/22/2016 10/02/2015 04/02/2015  Falls in the past year? Yes Yes No  Number  falls in past yr: 2 or more 2 or more -  Injury with Fall? No No -  Risk for fall due to : - Impaired balance/gait -  Follow up Falls prevention discussed - -   Depression Screen PHQ 2/9 Scores 10/22/2016 10/02/2015 10/02/2015 04/02/2015  PHQ - 2 Score 6 2 2  0  PHQ- 9 Score 17 6 - -    Cognitive Function     6CIT Screen 10/22/2016  What Year? 0 points  What month? 0 points  What time? 0 points  Count back from 20 0 points  Months in reverse 0 points  Repeat phrase 2 points  Total Score 2    Immunization History  Administered Date(s) Administered  . Influenza, High Dose Seasonal PF 04/02/2015, 04/02/2016  . Influenza,inj,Quad PF,36+ Mos 04/03/2014  . Pneumococcal Polysaccharide-23 04/06/2013, 04/05/2014  . Td 11/30/2006  . Zoster 04/06/2013   Screening Tests Health Maintenance  Topic Date Due  . PNA vac Low Risk Adult (2 of 2 - PCV13) 07/13/2026 (Originally 04/06/2015)  . TETANUS/TDAP  11/29/2016  . INFLUENZA VACCINE  02/10/2017      Plan:  I have personally reviewed and addressed the Medicare Annual Wellness questionnaire and have noted the following in the patient's chart:  A. Medical and social history B. Use of alcohol, tobacco or illicit drugs  C. Current medications and supplements D. Functional ability and status E.  Nutritional status F.  Physical activity G. Advance directives H. List of other physicians I.  Hospitalizations, surgeries, and ER visits in previous 12 months J.  [place] such as hearing and vision if needed, cognitive and depression L. Referrals and appointments - none  In addition, I have reviewed and discussed with patient certain preventive protocols, quality metrics, and best practice recommendations. A written personalized care plan for preventive services as well as general preventive health recommendations were provided to patient.  See attached scanned questionnaire for additional information.   Signed,  Fabio Neighbors, LPN Nurse Health Advisor   MD Recommendations: None.  I have reviewed the health advisors note, was  available for consultation and I agree with documentation and plan. Miguel Aschoff MD [facility]

## 2016-10-23 ENCOUNTER — Telehealth: Payer: Self-pay | Admitting: Emergency Medicine

## 2016-10-23 LAB — TSH: TSH: 2.36 u[IU]/mL (ref 0.450–4.500)

## 2016-10-23 LAB — CBC WITH DIFFERENTIAL/PLATELET
Basophils Absolute: 0 10*3/uL (ref 0.0–0.2)
Basos: 0 %
EOS (ABSOLUTE): 0.2 10*3/uL (ref 0.0–0.4)
Eos: 3 %
Hematocrit: 43.7 % (ref 37.5–51.0)
Hemoglobin: 14.9 g/dL (ref 13.0–17.7)
Immature Grans (Abs): 0 10*3/uL (ref 0.0–0.1)
Immature Granulocytes: 0 %
Lymphocytes Absolute: 1.3 10*3/uL (ref 0.7–3.1)
Lymphs: 22 %
MCH: 31.8 pg (ref 26.6–33.0)
MCHC: 34.1 g/dL (ref 31.5–35.7)
MCV: 93 fL (ref 79–97)
Monocytes Absolute: 0.7 10*3/uL (ref 0.1–0.9)
Monocytes: 11 %
Neutrophils Absolute: 3.7 10*3/uL (ref 1.4–7.0)
Neutrophils: 64 %
Platelets: 217 10*3/uL (ref 150–379)
RBC: 4.68 x10E6/uL (ref 4.14–5.80)
RDW: 13.5 % (ref 12.3–15.4)
WBC: 5.9 10*3/uL (ref 3.4–10.8)

## 2016-10-23 LAB — LIPID PANEL
Chol/HDL Ratio: 5.5 ratio — ABNORMAL HIGH (ref 0.0–5.0)
Cholesterol, Total: 219 mg/dL — ABNORMAL HIGH (ref 100–199)
HDL: 40 mg/dL (ref >39)
LDL Calculated: 150 mg/dL — ABNORMAL HIGH (ref 0–99)
Triglycerides: 145 mg/dL (ref 0–149)
VLDL Cholesterol Cal: 29 mg/dL (ref 5–40)

## 2016-10-23 LAB — COMPREHENSIVE METABOLIC PANEL
ALT: 13 IU/L (ref 0–44)
AST: 29 IU/L (ref 0–40)
Albumin/Globulin Ratio: 1.6 (ref 1.2–2.2)
Albumin: 4.6 g/dL (ref 3.5–4.8)
Alkaline Phosphatase: 78 IU/L (ref 39–117)
BUN/Creatinine Ratio: 20 (ref 10–24)
BUN: 22 mg/dL (ref 8–27)
Bilirubin Total: 0.7 mg/dL (ref 0.0–1.2)
CO2: 27 mmol/L (ref 18–29)
Calcium: 9.3 mg/dL (ref 8.6–10.2)
Chloride: 97 mmol/L (ref 96–106)
Creatinine, Ser: 1.08 mg/dL (ref 0.76–1.27)
GFR calc Af Amer: 77 mL/min/{1.73_m2} (ref >59)
GFR calc non Af Amer: 66 mL/min/{1.73_m2} (ref >59)
Globulin, Total: 2.9 g/dL (ref 1.5–4.5)
Glucose: 105 mg/dL — ABNORMAL HIGH (ref 65–99)
Potassium: 4.9 mmol/L (ref 3.5–5.2)
Sodium: 139 mmol/L (ref 134–144)
Total Protein: 7.5 g/dL (ref 6.0–8.5)

## 2016-10-23 NOTE — Telephone Encounter (Signed)
Pt informed and voiced understanding of results. 

## 2016-10-23 NOTE — Telephone Encounter (Signed)
-----   Message from [name]., MD sent at 10/23/2016 12:51 PM EDT ----- Labs okay.

## 2016-11-17 DIAGNOSIS — F4321 Adjustment disorder with depressed mood: Secondary | ICD-10-CM | POA: Diagnosis not present

## 2016-11-17 DIAGNOSIS — G20 Parkinson's disease: Secondary | ICD-10-CM | POA: Diagnosis not present

## 2016-11-17 DIAGNOSIS — G2581 Restless legs syndrome: Secondary | ICD-10-CM | POA: Diagnosis not present

## 2016-12-29 ENCOUNTER — Telehealth: Payer: Self-pay | Admitting: Urology

## 2016-12-29 NOTE — Telephone Encounter (Signed)
Spoke with patient and let him know that we did not have any Mrybetriq samples at this time, per Larene [place] ask if he needs and RX sent, patient declined stated he would call back.

## 2016-12-29 NOTE — Telephone Encounter (Signed)
Patient called and is asking for samples of Myrbetriq he said it is to expensive and was told that all he had to do is call and he could have samples anytime??  Please advise   Sharyn Lull

## 2017-01-04 DIAGNOSIS — R413 Other amnesia: Secondary | ICD-10-CM | POA: Diagnosis not present

## 2017-01-04 DIAGNOSIS — F4321 Adjustment disorder with depressed mood: Secondary | ICD-10-CM | POA: Diagnosis not present

## 2017-01-04 DIAGNOSIS — G20 Parkinson's disease: Secondary | ICD-10-CM | POA: Diagnosis not present

## 2017-01-04 DIAGNOSIS — G2581 Restless legs syndrome: Secondary | ICD-10-CM | POA: Diagnosis not present

## 2017-01-06 DIAGNOSIS — L821 Other seborrheic keratosis: Secondary | ICD-10-CM | POA: Diagnosis not present

## 2017-01-06 DIAGNOSIS — C44519 Basal cell carcinoma of skin of other part of trunk: Secondary | ICD-10-CM | POA: Diagnosis not present

## 2017-01-06 DIAGNOSIS — Z1283 Encounter for screening for malignant neoplasm of skin: Secondary | ICD-10-CM | POA: Diagnosis not present

## 2017-01-06 DIAGNOSIS — L570 Actinic keratosis: Secondary | ICD-10-CM | POA: Diagnosis not present

## 2017-01-06 DIAGNOSIS — L820 Inflamed seborrheic keratosis: Secondary | ICD-10-CM | POA: Diagnosis not present

## 2017-01-06 DIAGNOSIS — Z85828 Personal history of other malignant neoplasm of skin: Secondary | ICD-10-CM | POA: Diagnosis not present

## 2017-01-06 DIAGNOSIS — D485 Neoplasm of uncertain behavior of skin: Secondary | ICD-10-CM | POA: Diagnosis not present

## 2017-01-12 DIAGNOSIS — H40003 Preglaucoma, unspecified, bilateral: Secondary | ICD-10-CM | POA: Diagnosis not present

## 2017-01-27 ENCOUNTER — Telehealth: Payer: Self-pay | Admitting: Urology

## 2017-01-27 NOTE — Telephone Encounter (Signed)
Spoke with patient. Advised drug rep sample supply limited so unable to provide samples.Patient states Myrbetriq is too expensive, would like alt Rx if possible.

## 2017-01-27 NOTE — Telephone Encounter (Signed)
Patient is calling and asking for samples of myrbetriq   Thanks,  Sharyn Lull

## 2017-01-27 NOTE — Telephone Encounter (Signed)
Spoke with patient. Advised to contact insurance to find out what alt OAB med is preferred. Patient verbalized understanding.

## 2017-02-15 DIAGNOSIS — L578 Other skin changes due to chronic exposure to nonionizing radiation: Secondary | ICD-10-CM | POA: Diagnosis not present

## 2017-02-15 DIAGNOSIS — C44519 Basal cell carcinoma of skin of other part of trunk: Secondary | ICD-10-CM | POA: Diagnosis not present

## 2017-02-15 DIAGNOSIS — Z85828 Personal history of other malignant neoplasm of skin: Secondary | ICD-10-CM | POA: Diagnosis not present

## 2017-02-15 DIAGNOSIS — L820 Inflamed seborrheic keratosis: Secondary | ICD-10-CM | POA: Diagnosis not present

## 2017-02-23 ENCOUNTER — Encounter: Payer: Self-pay | Admitting: Family Medicine

## 2017-02-23 ENCOUNTER — Ambulatory Visit (INDEPENDENT_AMBULATORY_CARE_PROVIDER_SITE_OTHER): Payer: PPO | Admitting: Family Medicine

## 2017-02-23 VITALS — BP 144/86 | HR 88 | Temp 97.6°F | Resp 16 | Wt 218.0 lb

## 2017-02-23 DIAGNOSIS — I10 Essential (primary) hypertension: Secondary | ICD-10-CM

## 2017-02-23 DIAGNOSIS — R61 Generalized hyperhidrosis: Secondary | ICD-10-CM | POA: Diagnosis not present

## 2017-02-23 DIAGNOSIS — G20 Parkinson's disease: Secondary | ICD-10-CM | POA: Diagnosis not present

## 2017-02-23 NOTE — Progress Notes (Signed)
Subjective:  HPI  Hypertension, follow-up:  BP Readings from Last 3 Encounters:  02/23/17 (!) 144/86  10/22/16 138/88  10/22/16 (!) 152/92    He was last seen for hypertension 4 months ago.  BP at that visit was 138/88. Management since that visit includes stopped lisinopril due to lightheadedness. Pt has since started back in lisinopril because BP was too elevated.  He reports good compliance with treatment. He is not exercising. He is adherent to low salt diet.   Outside blood pressures are 140's/70-80's Cardiovascular risk factors include advanced age (older than 32 for men, 33 for women), hypertension and male gender.  16658} Wt Readings from Last 3 Encounters:  02/23/17 218 lb (98.9 kg)  10/22/16 233 lb (105.7 kg)  10/22/16 233 lb 6.4 oz (105.9 kg)   ------------------------------------------------------------------------ Pt reports that he has been sweating a lot more than normal. He says that he will be sitting in the [facility] and sweat for hours. He also is finding it harder to get around lately. He went to a kick boxing class and it has flared his knees up, so he has stopped it. It was a kick boxing class that was suppose to help parkinson's disease.   Depression screen [facility] 2/9 02/23/2017 10/22/2016 10/02/2015 10/02/2015 04/02/2015  Decreased Interest 3 3 1 1  0  Down, Depressed, Hopeless 1 3 1 1  0  PHQ - 2 Score 4 6 2 2  0  Altered sleeping 2 3 1  - -  Tired, decreased energy 3 3 1  - -  Change in appetite 1 2 1  - -  Feeling bad or failure about yourself  1 0 0 - -  Trouble concentrating 1 0 0 - -  Moving slowly or fidgety/restless 3 3 1  - -  Suicidal thoughts 0 0 0 - -  PHQ-9 Score 15 17 6  - -  Difficult doing work/chores Somewhat difficult Extremely dIfficult Not difficult at all - -       Prior to Admission medications   Medication Sig Start Date End Date Taking? Authorizing Provider  amoxicillin (AMOXIL) 500 MG capsule TAKE 1 CAPSULE (500 MG TOTAL) BY MOUTH 3  (THREE) TIMES DAILY. as needed for dental work 08/10/16   [provider]  budesonide (RHINOCORT AQUA) 32 MCG/ACT nasal spray USE 1 PUFF IN EACH NOSTRIL EVERY DAY AS NEEDED 06/23/16   [name]., MD  carbidopa-levodopa (SINEMET CR) 50-200 MG per tablet TAKE 2 TABLET BY MOUTH 4 (four) TIMES DAILY. 03/21/15   [provider]  celecoxib (CELEBREX) 200 MG capsule Take 1 capsule (200 mg total) by mouth 2 (two) times daily. 10/22/16   [name]., MD  Cholecalciferol (VITAMIN D) 2000 UNITS tablet Take 1,000 Units by mouth daily.  05/19/12   [provider]  erythromycin ophthalmic ointment Place 1 application into both eyes at bedtime.  12/31/15   [provider]  gabapentin (NEURONTIN) 400 MG capsule Take 400 mg by mouth 3 (three) times daily.  03/26/15   [provider]  hydrochlorothiazide (HYDRODIURIL) 12.5 MG tablet Take 1 tablet (12.5 mg total) by mouth daily. 05/22/15   [name]., MD  lisinopril (PRINIVIL,ZESTRIL) 2.5 MG tablet Take 1 tablet (2.5 mg total) by mouth 2 (two) times daily. 04/02/16   [name]., MD  loratadine (CLARITIN) 10 MG tablet Take 10 mg by mouth daily. 05/19/12   [provider]  mirabegron ER (MYRBETRIQ) 25 MG TB24 tablet Take 1 tablet (25  mg total) by mouth 2 (two) times daily. 06/25/16   Hollice Espy, MD  rOPINIRole (REQUIP XL) 4 MG 24 hr tablet  10/21/16   [provider]  rOPINIRole (REQUIP) 0.5 MG tablet Take 0.5 mg by mouth 3 (three) times daily.  12/31/15   [provider]  rOPINIRole (REQUIP) 4 MG tablet Take 4 mg by mouth at bedtime.    [provider]  vitamin C (ASCORBIC ACID) 500 MG tablet Take 500 mg by mouth daily.    [provider]  VITAMIN E NATURAL PO Take 1 capsule by mouth daily.  05/19/12   [provider]    Patient Active Problem List   Diagnosis Date Noted  . RUQ pain 10/22/2016  . Depression, major, single  episode, moderate ([place]) 10/22/2016  . Renal cyst, right 09/28/2016  . Hepatic steatosis 09/28/2016  . Parkinson's disease ([place]) 09/18/2015  . Restless leg 09/18/2015  . Malignant neoplasm of prostate ([place]) 05/22/2015  . Hepatitis A 04/02/2015  . Allergic rhinitis 04/02/2015  . Arthritis 04/02/2015  . Narrowing of intervertebral disc space 04/02/2015  . 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 Accumulation of fluid in tissues 04/02/2015  . Essential (primary) hypertension 04/02/2015  . Borderline diabetes 04/02/2015  . Hyperlipidemia 04/02/2015  . BP (high blood pressure) 04/02/2015  . Infected sebaceous cyst 04/02/2015  . Hernia, inguinal 04/02/2015  . Leg pain 04/02/2015  . Lumbar canal stenosis 04/02/2015  . 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 Malignant melanoma ([place]) 04/02/2015  . Arthritis, degenerative 04/02/2015  . Fatigue 04/02/2015  . Idiopathic Parkinson's disease ([place]) 04/02/2015  . Plantar fasciitis 04/02/2015  . CA of prostate ([place]) 04/02/2015  . Benign essential tremor 04/02/2015  . Lower urinary tract infection 04/02/2015  . Degeneration of intervertebral disc of lumbosacral region 05/10/2012    Past Medical History:  Diagnosis Date  . Allergy    allergic rhinitis  . Arthritis    OA  . Chronic kidney disease    stones  . Depression   . Family history of adverse reaction to anesthesia   . Hematuria   . History of kidney stones   . Hyperlipidemia   . Hypertension    no meds since parkinsons meds started  . Incontinence of urine   . Inguinal hernia   . Neuromuscular disorder ([place])    parkinsons  . Overactive bladder   . Parkinson's disease ([place])   . Prostate cancer ([place])   . Rhinitis, allergic   . Shortness of breath dyspnea    doe secondary to parkinsons    Social History   Social History  . Marital status: Married    Spouse name: N/A  . Number of children: N/A  . Years of education: N/A   Occupational History  . Not on file.    Social History Main Topics  . Smoking status: Former Smoker    Types: Cigars    Quit date: 11/11/1968  . Smokeless tobacco: Former Systems developer    Types: [place] date: 11/12/2003     Comment: Occassionally, also chewed tobacco till 2005. Quit smoking 1973  . Alcohol use No  . Drug use: No  . Sexual activity: No   Other Topics Concern  . Not on file   Social History Narrative  . No narrative on file    No Known Allergies  Review of Systems  Constitutional: Positive for diaphoresis.  HENT: Negative.   Eyes: Negative.   Respiratory: Negative.  Cardiovascular: Negative.   Gastrointestinal: Negative.   Genitourinary: Negative.   Musculoskeletal: Positive for joint pain.  Skin: Negative.   Neurological: Negative.   Endo/Heme/Allergies: Negative.   Psychiatric/Behavioral: Negative.     Immunization History  Administered Date(s) Administered  . Influenza, High Dose Seasonal PF 04/02/2015, 04/02/2016  . Influenza,inj,Quad PF,36+ Mos 04/03/2014  . Pneumococcal Conjugate-13 10/22/2016  . Pneumococcal Polysaccharide-23 04/06/2013, 04/05/2014  . Td 11/30/2006  . Zoster 04/06/2013    Objective:  BP (!) 144/86 (BP Location: Left Arm, Patient Position: Sitting, Cuff Size: Normal)   Pulse 88   Temp 97.6 F (36.4 C) (Oral)   Resp 16   Wt 218 lb (98.9 kg)   BMI 30.40 kg/m   Physical Exam  Constitutional: He is oriented to person, place, and time and well-developed, well-nourished, and in no distress.  Eyes: Pupils are equal, round, and reactive to light. Conjunctivae and EOM are normal.  Neck: Normal range of motion. Neck supple.  Cardiovascular: Normal rate, regular rhythm, normal heart sounds and intact distal pulses.   Pulmonary/Chest: Effort normal and breath sounds normal.  Musculoskeletal: Normal range of motion.  Neurological: He is alert and oriented to person, place, and time. He has normal reflexes. Gait normal. GCS score is 15.  Skin: Skin is warm and dry.    Psychiatric: Mood, memory, affect and judgment normal.    Lab Results  Component Value Date   WBC 5.9 10/22/2016   HGB 14.9 10/22/2016   HCT 43.7 10/22/2016   PLT 217 10/22/2016   GLUCOSE 105 (H) 10/22/2016   CHOL 219 (H) 10/22/2016   TRIG 145 10/22/2016   HDL 40 10/22/2016   LDLCALC 150 (H) 10/22/2016   TSH 2.360 10/22/2016   PSA <0.1 09/11/2010   INR 1.0 03/21/2014   HGBA1C 6.0 04/02/2016    CMP     Component Value Date/Time   NA 139 10/22/2016 1018   NA 136 04/06/2014 0434   K 4.9 10/22/2016 1018   K 3.8 04/06/2014 0434   CL 97 10/22/2016 1018   CL 103 04/06/2014 0434   CO2 27 10/22/2016 1018   CO2 26 04/06/2014 0434   GLUCOSE 105 (H) 10/22/2016 1018   GLUCOSE 83 01/07/2016 1201   GLUCOSE 121 (H) 04/06/2014 0434   BUN 22 10/22/2016 1018   BUN 13 04/06/2014 0434   CREATININE 1.08 10/22/2016 1018   CREATININE 0.94 04/06/2014 0434   CALCIUM 9.3 10/22/2016 1018   CALCIUM 8.1 (L) 04/06/2014 0434   PROT 7.5 10/22/2016 1018   ALBUMIN 4.6 10/22/2016 1018   AST 29 10/22/2016 1018   ALT 13 10/22/2016 1018   ALKPHOS 78 10/22/2016 1018   BILITOT 0.7 10/22/2016 1018   GFRNONAA 66 10/22/2016 1018   GFRNONAA >60 04/06/2014 0434   GFRNONAA 54 (L) 04/04/2014 0527   GFRAA 77 10/22/2016 1018   GFRAA >60 04/06/2014 0434   GFRAA >60 04/04/2014 0527    Assessment and Plan :  1. Essential (primary) hypertension  - CBC with Differential/Platelet - TSH  2. Parkinson's disease ([place])   3. Diaphoresis RTC 2-4 weeks. - Comprehensive metabolic panel - EKG 22-QJFH 4.Prostate Cancer  HPI, Exam, and A&P Transcribed under the direction and in the presence of Richard L. Cranford Mon, MD  Electronically Signed: Katina Dung, [place] MD [facility] 02/23/2017 2:56 PM

## 2017-02-24 ENCOUNTER — Telehealth: Payer: Self-pay | Admitting: Family Medicine

## 2017-02-24 LAB — CBC WITH DIFFERENTIAL/PLATELET
Basophils Absolute: 0 10*3/uL (ref 0.0–0.2)
Basos: 0 %
EOS (ABSOLUTE): 0.2 10*3/uL (ref 0.0–0.4)
Eos: 3 %
Hematocrit: 43.7 % (ref 37.5–51.0)
Hemoglobin: 15.3 g/dL (ref 13.0–17.7)
Immature Grans (Abs): 0 10*3/uL (ref 0.0–0.1)
Immature Granulocytes: 0 %
Lymphocytes Absolute: 1.3 10*3/uL (ref 0.7–3.1)
Lymphs: 20 %
MCH: 31.8 pg (ref 26.6–33.0)
MCHC: 35 g/dL (ref 31.5–35.7)
MCV: 91 fL (ref 79–97)
Monocytes Absolute: 0.9 10*3/uL (ref 0.1–0.9)
Monocytes: 14 %
Neutrophils Absolute: 4.1 10*3/uL (ref 1.4–7.0)
Neutrophils: 63 %
Platelets: 218 10*3/uL (ref 150–379)
RBC: 4.81 x10E6/uL (ref 4.14–5.80)
RDW: 13.7 % (ref 12.3–15.4)
WBC: 6.5 10*3/uL (ref 3.4–10.8)

## 2017-02-24 LAB — COMPREHENSIVE METABOLIC PANEL
ALT: 11 IU/L (ref 0–44)
AST: 19 IU/L (ref 0–40)
Albumin/Globulin Ratio: 1.4 (ref 1.2–2.2)
Albumin: 4.3 g/dL (ref 3.5–4.8)
Alkaline Phosphatase: 73 IU/L (ref 39–117)
BUN/Creatinine Ratio: 17 (ref 10–24)
BUN: 20 mg/dL (ref 8–27)
Bilirubin Total: 0.5 mg/dL (ref 0.0–1.2)
CO2: 25 mmol/L (ref 20–29)
Calcium: 9.4 mg/dL (ref 8.6–10.2)
Chloride: 100 mmol/L (ref 96–106)
Creatinine, Ser: 1.18 mg/dL (ref 0.76–1.27)
GFR calc Af Amer: 68 mL/min/{1.73_m2} (ref >59)
GFR calc non Af Amer: 59 mL/min/{1.73_m2} — ABNORMAL LOW (ref >59)
Globulin, Total: 3.1 g/dL (ref 1.5–4.5)
Glucose: 98 mg/dL (ref 65–99)
Potassium: 4.9 mmol/L (ref 3.5–5.2)
Sodium: 141 mmol/L (ref 134–144)
Total Protein: 7.4 g/dL (ref 6.0–8.5)

## 2017-02-24 LAB — TSH: TSH: 1.94 u[IU]/mL (ref 0.450–4.500)

## 2017-02-24 NOTE — Telephone Encounter (Signed)
-----   Message from [name]., MD sent at 02/24/2017 10:45 AM EDT ----- Labs okay.

## 2017-02-24 NOTE — Telephone Encounter (Signed)
Pt is returning call.  CB#[phone]/MW

## 2017-02-24 NOTE — Telephone Encounter (Signed)
Patient advised.

## 2017-03-16 ENCOUNTER — Ambulatory Visit: Payer: PPO | Admitting: Family Medicine

## 2017-03-21 ENCOUNTER — Other Ambulatory Visit: Payer: Self-pay | Admitting: Family Medicine

## 2017-03-21 DIAGNOSIS — M1990 Unspecified osteoarthritis, unspecified site: Secondary | ICD-10-CM

## 2017-03-22 DIAGNOSIS — G2581 Restless legs syndrome: Secondary | ICD-10-CM | POA: Diagnosis not present

## 2017-03-22 DIAGNOSIS — R61 Generalized hyperhidrosis: Secondary | ICD-10-CM | POA: Diagnosis not present

## 2017-03-22 DIAGNOSIS — G20 Parkinson's disease: Secondary | ICD-10-CM | POA: Diagnosis not present

## 2017-03-22 DIAGNOSIS — R413 Other amnesia: Secondary | ICD-10-CM | POA: Diagnosis not present

## 2017-03-26 ENCOUNTER — Ambulatory Visit: Payer: PPO | Admitting: Urology

## 2017-03-26 ENCOUNTER — Other Ambulatory Visit: Payer: Self-pay | Admitting: Family Medicine

## 2017-03-26 DIAGNOSIS — M1990 Unspecified osteoarthritis, unspecified site: Secondary | ICD-10-CM

## 2017-04-09 ENCOUNTER — Other Ambulatory Visit: Payer: Self-pay | Admitting: Family Medicine

## 2017-04-09 DIAGNOSIS — I10 Essential (primary) hypertension: Secondary | ICD-10-CM

## 2017-04-13 ENCOUNTER — Encounter: Payer: Self-pay | Admitting: Urology

## 2017-04-13 ENCOUNTER — Ambulatory Visit: Payer: PPO | Admitting: Urology

## 2017-04-13 VITALS — BP 155/90 | HR 80 | Ht 71.0 in | Wt 215.0 lb

## 2017-04-13 DIAGNOSIS — N393 Stress incontinence (female) (male): Secondary | ICD-10-CM

## 2017-04-13 DIAGNOSIS — Z8546 Personal history of malignant neoplasm of prostate: Secondary | ICD-10-CM | POA: Diagnosis not present

## 2017-04-13 DIAGNOSIS — N200 Calculus of kidney: Secondary | ICD-10-CM | POA: Diagnosis not present

## 2017-04-13 DIAGNOSIS — N529 Male erectile dysfunction, unspecified: Secondary | ICD-10-CM

## 2017-04-13 DIAGNOSIS — N3281 Overactive bladder: Secondary | ICD-10-CM

## 2017-04-13 LAB — BLADDER SCAN AMB NON-IMAGING: Scan Result: 0

## 2017-04-13 NOTE — Progress Notes (Signed)
5:24 PM  04/14/17  Roger Rodriguez 12-Jun-1941 [id number]  Referring provider: [name]., MD [address] [place], [place] [address]  Chief Complaint  Patient presents with  . Follow-up    HPI:  76 year old male with multiple GU issues here for routine 6 months f/u.  Prostate cancer/ SUI Patient has a personal history of prostate cancer and underwent open radical prostatectomy in March 2006 by Dr. Quillian Quince.  Pathology unknown.  NED.   Most recent PSA <0.1 on 3/18.   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    Bladder overactivity /urinary frequency Patient has significant urinary symptoms today including urinary frequency and SUI.  He does have trouble starting his stream and has to sit to void.   He is on Mybetriq  25 mg bid which helps.   He does have a history of Parkinson disease.    PVR today 0   Hematuria Microsocpic hematuria times multiple. Status post CT urogram 4/17.  Cysto negative.     History of kidney stones He does have a personal history of kidney stones.  He has had several ESWL, URS (2017) in the past.  Stone analysis Ca oxalate monohydrate 80%, Ca oxalate dihydrate 10%, Ca phosphate 5% and uric acid 5%.  24 hour urine 09/2016 showed adequate urinary volume to 2.48 L.  Urine Calcium is borderline elevated at 269 mg/day.  He also was noted to have borderline hyperoxaluria ( 44 mg/day).  On the day of the study, he reports that he tried to do a good job and drink more fluid than he typically does.  No recent stone episodes. KUB today unremarkable.  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  He has tried Viagra along with other PDE 5 inhibitors not affected. He has previously tried alprostadil injections with fairly decent success although finds these to be too expensive.   He has tried trimix and has done well with the last dosage.        IPSS    Row Name 04/13/17 1600         International Prostate  Symptom Score   How often have you had the sensation of not emptying your bladder? Almost always     How often have you had to urinate less than every two hours? More than half the time     How often have you found you stopped and started again several times when you urinated? Almost always     How often have you found it difficult to postpone urination? Less than 1 in 5 times     How often have you had a weak urinary stream? Almost always     How often have you had to strain to start urination? Almost always     How many times did you typically get up at night to urinate? 5 Times     Total IPSS Score 30       Quality of Life due to urinary symptoms   If you were to spend the rest of your life with your urinary condition just the way it is now how would you feel about that? Mostly Disatisfied       Score:  1-7 Mild 8-19 Moderate 20-35 Severe   PMH: Past Medical History:  Diagnosis Date  . Allergy    allergic rhinitis  . Arthritis    OA  . Chronic kidney disease    stones  .  Depression   . Family history of adverse reaction to anesthesia   . Hematuria   . History of kidney stones   . Hyperlipidemia   . Hypertension    no meds since parkinsons meds started  . Incontinence of urine   . Inguinal hernia   . Neuromuscular disorder ([place])    parkinsons  . Overactive bladder   . Parkinson's disease ([place])   . Prostate cancer ([place])   . Rhinitis, allergic   . Shortness of breath dyspnea    doe secondary to parkinsons    Surgical History: Past Surgical History:  Procedure Laterality Date  . BACK SURGERY  2014  . CYSTOSCOPY WITH STENT PLACEMENT Right 01/13/2016   Procedure: CYSTOSCOPY WITH STENT PLACEMENT;  Surgeon: Hollice Espy, MD;  Location: [facility] ORS;  Service: Urology;  Laterality: Right;  . CYSTOSCOPY/URETEROSCOPY/HOLMIUM LASER/STENT PLACEMENT Left 11/20/2015   Procedure: CYSTOSCOPY/URETEROSCOPY/HOLMIUM LASER/STENT PLACEMENT;  Surgeon: Hollice Espy, MD;  Location: [facility]  ORS;  Service: Urology;  Laterality: Left;  . EYE SURGERY    . FRACTURE SURGERY      ankle   . H/O arthroscopy of right knee  2015  . H/O radical protatectomy    . HERNIA REPAIR    . JOINT REPLACEMENT     right knee  . LITHOTRIPSY  2010  . Rotator Cuff tear  1993   Repaired  . URETEROSCOPY WITH HOLMIUM LASER LITHOTRIPSY  2002?  [name] URETEROSCOPY WITH HOLMIUM LASER LITHOTRIPSY Right 01/13/2016   Procedure: URETEROSCOPY WITH HOLMIUM LASER LITHOTRIPSY;  Surgeon: Hollice Espy, MD;  Location: [facility] ORS;  Service: Urology;  Laterality: Right;  [name] VASECTOMY     History    Home Medications:  Allergies as of 04/13/2017   No Known Allergies     Medication List       Accurate as of 04/13/17 11:59 PM. Always use your most recent med list.          Amantadine HCl 100 MG tablet Take 1 tablet by mouth 2 (two) times daily. Start taking on:  04/21/2017   budesonide 32 MCG/ACT nasal spray Commonly known as:  RHINOCORT AQUA USE 1 PUFF IN EACH NOSTRIL EVERY DAY AS NEEDED   carbidopa-levodopa 50-200 MG tablet Commonly known as:  SINEMET CR TAKE 2 TABLET BY MOUTH 4 (four) TIMES DAILY.   celecoxib 200 MG capsule Commonly known as:  CELEBREX Take 1 capsule (200 mg total) by mouth daily.   celecoxib 100 MG capsule Commonly known as:  CELEBREX Take 2 capsules (200 mg total) by mouth 2 (two) times daily.   erythromycin ophthalmic ointment Place 1 application into both eyes at bedtime.   gabapentin 400 MG capsule Commonly known as:  NEURONTIN Take 400 mg by mouth 3 (three) times daily.   hydrochlorothiazide 12.5 MG tablet Commonly known as:  HYDRODIURIL Take 1 tablet (12.5 mg total) by mouth daily.   lisinopril 2.5 MG tablet Commonly known as:  PRINIVIL,ZESTRIL TAKE 1 TABLET (2.5 MG TOTAL) BY MOUTH 2 (TWO) TIMES DAILY.   loratadine 10 MG tablet Commonly known as:  CLARITIN Take 10 mg by mouth daily.   mirabegron ER 25 MG Tb24 tablet Commonly known as:  MYRBETRIQ Take 1 tablet  (25 mg total) by mouth 2 (two) times daily.   pramipexole 0.5 MG tablet Commonly known as:  MIRAPEX Pramipexole 0.5 mg in the morning, 0.5 mg in the afternoon and 0.5 mg at night.   rOPINIRole 0.5 MG tablet Commonly known as:  REQUIP Take 0.5 mg by  mouth 3 (three) times daily.   rOPINIRole 4 MG 24 hr tablet Commonly known as:  REQUIP XL   vitamin C 500 MG tablet Commonly known as:  ASCORBIC ACID Take 500 mg by mouth daily.   Vitamin D 2000 units tablet Take 1,000 Units by mouth daily.   VITAMIN E NATURAL PO Take 1 capsule by mouth daily.       Allergies: No Known Allergies  Family History: Family History  Problem Relation Age of Onset  . Dementia Mother   . Cerebral aneurysm Father   . Bladder Cancer Paternal Uncle   . Liver cancer Sister   . Prostate cancer Neg Hx   . Kidney cancer Neg Hx     Social History:  reports that he quit smoking about 48 years ago. His smoking use included Cigars. He quit smokeless tobacco use about 13 years ago. His smokeless tobacco use included Chew. He reports that he does not drink alcohol or use drugs.  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 His wife passed away this fall of Parkinson's disease/dementia.  Physical Exam: BP (!) 155/90 (BP Location: Left Arm, Patient Position: Sitting, Cuff Size: Normal)   Pulse 80   Ht 5\' 11"  (1.803 m)   Wt 215 lb (97.5 kg)   BMI 29.99 kg/m   Constitutional:  Alert and oriented, No acute distress. HEENT: [place] AT, moist mucus membranes.  Trachea midline, no masses. Cardiovascular: No clubbing, cyanosis, or edema.  Respiratory: Normal respiratory effort, no increased work of breathing. GI: Abdomen is soft, nontender, nondistended, no abdominal masses GU: No CVA tenderness.  Skin: No rashes, bruises or suspicious lesions. Neurologic: Grossly intact, no focal deficits, moving all 4 extremities.  Mild resting tremor noted. Ambulates with cane. Psychiatric: Normal mood and  affect.  Laboratory Data: Lab Results  Component Value Date   WBC 6.5 02/23/2017   HGB 15.3 02/23/2017   HCT 43.7 02/23/2017   MCV 91 02/23/2017   PLT 218 02/23/2017    Lab Results  Component Value Date   CREATININE 1.18 02/23/2017    PSA as above  Lab Results  Component Value Date   HGBA1C 6.0 04/02/2016    Pertinent Imaging: Results for orders placed or performed in visit on 04/13/17  Bladder Scan (Post Void Residual) in office  Result Value Ref Range   Scan Result 0     Assessment & Plan:    1.  History of kidney stones No recent stone episodes Continue hydrochlorothiazide KUB in 6 months  2. Malignant neoplasm of prostate [facility]), history of S/p prostatectomy 2006, NED.  PSA undetectable. Annual PSA, do in 6 months  3. OAB (overactive bladder) Continue  Mybetriq 25mg  bid, doing well Additional samples given today x 4 boxes Discussed options for refractory OAB including PTNS and Botox, literature taken today and PTNS interested in proceeding with this  4. Urinary incontinence, stress, male Wears depends  5. Microscopic hematuria S/p negative hematuria work in 2017, stones treated  6. ED Previously trimix (standard dose) 1 mg with some but not ideal response Doing well with "super trimix" 1 mL  Refill called in   Return in about 6 months (around 10/12/2017) for KUB, PSA, IPSS, PVR.   Hollice Espy, MD  [facility] [address] [place],  [address] [phone]  I spent 25 min with this patient of which greater than 50% was spent in counseling and coordination of care with the patient.

## 2017-04-15 ENCOUNTER — Ambulatory Visit (INDEPENDENT_AMBULATORY_CARE_PROVIDER_SITE_OTHER): Payer: PPO | Admitting: Family Medicine

## 2017-04-15 VITALS — BP 102/64 | HR 72 | Temp 98.0°F | Resp 14 | Wt 217.4 lb

## 2017-04-15 DIAGNOSIS — I10 Essential (primary) hypertension: Secondary | ICD-10-CM | POA: Diagnosis not present

## 2017-04-15 DIAGNOSIS — G20 Parkinson's disease: Secondary | ICD-10-CM

## 2017-04-15 DIAGNOSIS — R61 Generalized hyperhidrosis: Secondary | ICD-10-CM

## 2017-04-15 DIAGNOSIS — Z23 Encounter for immunization: Secondary | ICD-10-CM | POA: Diagnosis not present

## 2017-04-15 DIAGNOSIS — M1990 Unspecified osteoarthritis, unspecified site: Secondary | ICD-10-CM

## 2017-04-15 MED ORDER — CELECOXIB 200 MG PO CAPS: 200.0000 mg | ORAL_CAPSULE | Freq: Two times a day (BID) | ORAL | 5 refills | Status: DC

## 2017-04-15 NOTE — Progress Notes (Signed)
Roger Rodriguez  MRN: [id number] DOB: 1941/04/11  Subjective:  HPI   Patient is here for follow up on unexplained sweating. Last office visit was on 02/23/17 for routine check up and this issue was addressed. Lab work was checked and EKG and it was ok. Patient states he is still having episodes of sweating that comes on, seems worse, more frequent. No pattern to when this will happen. When he does have this episode then he also feels weak when this happens but no pain anywhere at that time. Patient has discussed this issue with Dr Manuella Ghazi also and he was not sure what was causing these symptoms either. Wt Readings from Last 3 Encounters:  04/15/17 217 lb 6.4 oz (98.6 kg)  04/13/17 215 lb (97.5 kg)  02/23/17 218 lb (98.9 kg)   BP Readings from Last 3 Encounters:  04/15/17 102/64  04/13/17 (!) 155/90  02/23/17 (!) 144/86   Depression screen PHQ 2/9 04/15/2017 02/23/2017 10/22/2016  Decreased Interest 2 3 3   Down, Depressed, Hopeless 1 1 3   PHQ - 2 Score 3 4 6   Altered sleeping 3 2 3   Tired, decreased energy 3 3 3   Change in appetite 0 1 2  Feeling bad or failure about yourself  1 1 0  Trouble concentrating 1 1 0  Moving slowly or fidgety/restless 3 3 3   Suicidal thoughts 0 0 0  PHQ-9 Score 14 15 17   Difficult doing work/chores Extremely dIfficult Somewhat difficult Extremely dIfficult   Patient Active Problem List   Diagnosis Date Noted  . RUQ pain 10/22/2016  . Depression, major, single episode, moderate ([place]) 10/22/2016  . Renal cyst, right 09/28/2016  . Hepatic steatosis 09/28/2016  . Parkinson's disease ([place]) 09/18/2015  . Restless leg 09/18/2015  . Malignant neoplasm of prostate ([place]) 05/22/2015  . Hepatitis A 04/02/2015  . Allergic rhinitis 04/02/2015  . Arthritis 04/02/2015  . Narrowing of intervertebral disc space 04/02/2015  . 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 Accumulation of fluid in tissues 04/02/2015  . Essential (primary) hypertension 04/02/2015  .  Borderline diabetes 04/02/2015  . Hyperlipidemia 04/02/2015  . BP (high blood pressure) 04/02/2015  . Infected sebaceous cyst 04/02/2015  . Hernia, inguinal 04/02/2015  . Leg pain 04/02/2015  . Lumbar canal stenosis 04/02/2015  . 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 Malignant melanoma ([place]) 04/02/2015  . Arthritis, degenerative 04/02/2015  . Fatigue 04/02/2015  . Idiopathic Parkinson's disease ([place]) 04/02/2015  . Plantar fasciitis 04/02/2015  . CA of prostate ([place]) 04/02/2015  . Benign essential tremor 04/02/2015  . Lower urinary tract infection 04/02/2015  . Degeneration of intervertebral disc of lumbosacral region 05/10/2012    Past Medical History:  Diagnosis Date  . Allergy    allergic rhinitis  . Arthritis    OA  . Chronic kidney disease    stones  . Depression   . Family history of adverse reaction to anesthesia   . Hematuria   . History of kidney stones   . Hyperlipidemia   . Hypertension    no meds since parkinsons meds started  . Incontinence of urine   . Inguinal hernia   . Neuromuscular disorder ([place])    parkinsons  . Overactive bladder   . Parkinson's disease ([place])   . Prostate cancer ([place])   . Rhinitis, allergic   . Shortness of breath dyspnea    doe secondary to parkinsons    Social History   Social History  . Marital status: Married  Spouse name: N/A  . Number of children: N/A  . Years of education: N/A   Occupational History  . Not on file.   Social History Main Topics  . Smoking status: Former Smoker    Types: Cigars    Quit date: 11/11/1968  . Smokeless tobacco: Former Systems developer    Types: [place] date: 11/12/2003     Comment: Occassionally, also chewed tobacco till 2005. Quit smoking 1973  . Alcohol use No  . Drug use: No  . Sexual activity: No   Other Topics Concern  . Not on file   Social History Narrative  . No narrative on file    Outpatient Encounter Prescriptions as of 04/15/2017  Medication Sig  . [START ON  04/21/2017] Amantadine HCl 100 MG tablet Take 1 tablet by mouth 2 (two) times daily.  . budesonide (RHINOCORT AQUA) 32 MCG/ACT nasal spray USE 1 PUFF IN EACH NOSTRIL EVERY DAY AS NEEDED  . carbidopa-levodopa (SINEMET CR) 50-200 MG per tablet TAKE 2 TABLET BY MOUTH 4 (four) TIMES DAILY.  . celecoxib (CELEBREX) 200 MG capsule Take 1 capsule (200 mg total) by mouth daily.  . Cholecalciferol (VITAMIN D) 2000 UNITS tablet Take 1,000 Units by mouth daily.   [name] erythromycin ophthalmic ointment Place 1 application into both eyes at bedtime.   . gabapentin (NEURONTIN) 400 MG capsule Take 400 mg by mouth 3 (three) times daily.   . hydrochlorothiazide (HYDRODIURIL) 12.5 MG tablet Take 1 tablet (12.5 mg total) by mouth daily.  [name] lisinopril (PRINIVIL,ZESTRIL) 2.5 MG tablet TAKE 1 TABLET (2.5 MG TOTAL) BY MOUTH 2 (TWO) TIMES DAILY.  [name] loratadine (CLARITIN) 10 MG tablet Take 10 mg by mouth daily.  . mirabegron ER (MYRBETRIQ) 25 MG TB24 tablet Take 1 tablet (25 mg total) by mouth 2 (two) times daily.  . pramipexole (MIRAPEX) 0.5 MG tablet Pramipexole 0.5 mg in the morning, 0.5 mg in the afternoon and 0.5 mg at night.  [name] rOPINIRole (REQUIP) 0.5 MG tablet Take 0.5 mg by mouth 3 (three) times daily.   . vitamin C (ASCORBIC ACID) 500 MG tablet Take 500 mg by mouth daily.  [name] VITAMIN E NATURAL PO Take 1 capsule by mouth daily.   . [DISCONTINUED] celecoxib (CELEBREX) 100 MG capsule Take 2 capsules (200 mg total) by mouth 2 (two) times daily.  . [DISCONTINUED] rOPINIRole (REQUIP XL) 4 MG 24 hr tablet    No facility-administered encounter medications on file as of 04/15/2017.     No Known Allergies  Review of Systems  Constitutional: Positive for malaise/fatigue.  HENT:       Runny nose  Eyes: Negative.   Respiratory: Negative.   Cardiovascular: Negative.   Gastrointestinal: Negative.   Musculoskeletal: Positive for joint pain.  Skin: Negative.   Neurological: Positive for weakness. Negative for dizziness and  headaches.  Endo/Heme/Allergies: Negative.   Psychiatric/Behavioral: Positive for depression. The patient has insomnia.     Objective:  BP 102/64   Pulse 72   Temp 98 F (36.7 C)   Resp 14   Wt 217 lb 6.4 oz (98.6 kg)   BMI 30.32 kg/m   Physical Exam  Constitutional: He is oriented to person, place, and time and well-developed, well-nourished, and in no distress.  HENT:  Head: Normocephalic and atraumatic.  Eyes: Pupils are equal, round, and reactive to light. Conjunctivae are normal. No scleral icterus.  Neck: Normal range of motion. Neck supple. No thyromegaly present.  Cardiovascular: Normal rate, regular rhythm, normal  heart sounds and intact distal pulses.  Exam reveals no gallop.   No murmur heard. Pulmonary/Chest: Effort normal and breath sounds normal. No respiratory distress. He has no wheezes.  Abdominal: Soft.  Neurological: He is alert and oriented to person, place, and time.  Skin: Skin is warm and dry.  Psychiatric: Mood, memory, affect and judgment normal.   Assessment and Plan :  1. Diaphoresis Not sure of etiology. Advised patient to check with pharmacist on his medications and see if one could be causing his symptoms.possibly sinemet.  2. Parkinson's disease ([place]) Follows with Dr. Manuella Ghazi  3. Essential (primary) hypertension Stable at this time.  4. Need for immunization against influenza - Flu vaccine HIGH DOSE PF (Fluzone High dose)  5. Arthritis Refill at his previous dose. This dose helps him more. Discussed in details the risks of taking this medication and at this higher dose. Patient understand but wants to continue taking this medication this way despite the possible issues it could cost. 6.MDD [place] is 13 --last was 14.  HPI, Exam and A&P transcribed by Tiffany Kocher, RMA under direction and in the presence of Miguel Aschoff, MD.  - celecoxib (CELEBREX) 200 MG capsule; Take 1 capsule (200 mg total) by mouth 2 (two) times daily.   Dispense: 60 capsule; Refill: 5 I have done the exam and reviewed the chart and it is accurate to the best of my knowledge. Development worker, community has been used and  any errors in dictation or transcription are unintentional. Miguel Aschoff M.D. [facility]

## 2017-05-18 ENCOUNTER — Other Ambulatory Visit: Payer: Self-pay

## 2017-05-18 DIAGNOSIS — N3281 Overactive bladder: Secondary | ICD-10-CM

## 2017-05-18 MED ORDER — MIRABEGRON ER 25 MG PO TB24: 25.0000 mg | ORAL_TABLET | Freq: Two times a day (BID) | ORAL | 3 refills | Status: DC

## 2017-06-07 ENCOUNTER — Other Ambulatory Visit: Payer: Self-pay

## 2017-06-07 ENCOUNTER — Telehealth: Payer: Self-pay | Admitting: Family Medicine

## 2017-06-07 DIAGNOSIS — Z792 Long term (current) use of antibiotics: Secondary | ICD-10-CM

## 2017-06-07 MED ORDER — AMOXICILLIN 500 MG PO CAPS: 500.0000 mg | ORAL_CAPSULE | ORAL | 0 refills | Status: DC

## 2017-06-07 NOTE — Telephone Encounter (Signed)
Please review-Dheeraj Hail V Rolen Conger, RMA  

## 2017-06-07 NOTE — Telephone Encounter (Signed)
Pt states he is going to have dental work done on Thursday.  Pt is requesting an antibiotic.  [facility].  CB#[phone]/MW

## 2017-06-07 NOTE — Telephone Encounter (Signed)
Amoxil 500mg  --4 before procedure--#4

## 2017-06-12 ENCOUNTER — Encounter: Payer: Self-pay | Admitting: Family Medicine

## 2017-06-12 ENCOUNTER — Ambulatory Visit (INDEPENDENT_AMBULATORY_CARE_PROVIDER_SITE_OTHER): Payer: PPO | Admitting: Family Medicine

## 2017-06-12 VITALS — BP 120/80 | HR 83 | Temp 97.7°F | Resp 16 | Wt 219.2 lb

## 2017-06-12 DIAGNOSIS — J069 Acute upper respiratory infection, unspecified: Secondary | ICD-10-CM

## 2017-06-12 MED ORDER — CEFDINIR 300 MG PO CAPS: 300.0000 mg | ORAL_CAPSULE | Freq: Two times a day (BID) | ORAL | 0 refills | Status: DC

## 2017-06-12 MED ORDER — HYDROCOD POLST-CPM POLST ER 10-8 MG/5ML PO SUER: 5.0000 mL | Freq: Two times a day (BID) | ORAL | 0 refills | Status: DC | PRN

## 2017-06-12 NOTE — Progress Notes (Signed)
Subjective:     Patient ID: Roger Rodriguez, male   DOB: 1941/02/11, 76 y.o.   MRN: [id number] Chief Complaint  Patient presents with  . Nasal Congestion    Patient is here with c/o congestion for the past week. Other symptoms cough,runny nose,sinus pressure, postnasal drip,headaches,hoarse voice,wheezing,chest tightness and SOB. Denies Chest pain.Treatment tried: Antibiotic (reports he had some dental done this past Friday) Amoxicillin and Tylenol.   HPI States cough productive of purulent sputum. No fever or chills.  Review of Systems     Objective:   Physical Exam  Constitutional: He appears well-developed and well-nourished. No distress.  Ears: T.M's intact without inflammation Throat;tonsils not seen Neck: no cervical adenopathy Lungs: clear     Assessment:    1. Viral upper respiratory tract infection - chlorpheniramine-HYDROcodone (TUSSIONEX PENNKINETIC ER) 10-8 MG/5ML SUER; Take 5 mLs by mouth every 12 (twelve) hours as needed for cough.  Dispense: 60 mL; Refill: 0 - cefdinir (OMNICEF) 300 MG capsule; Take 1 capsule (300 mg total) by mouth 2 (two) times daily.  Dispense: 20 capsule; Refill: 0    Plan:    Discussed use of Mucinex D. To start abx if cough or sinuses not improving over the next few days.

## 2017-06-12 NOTE — Patient Instructions (Addendum)
Discussed use of Mucinex D for congestion. If cough not improving over the next few days start the antibiotic.

## 2017-06-17 ENCOUNTER — Telehealth: Payer: Self-pay | Admitting: Family Medicine

## 2017-06-17 ENCOUNTER — Other Ambulatory Visit: Payer: Self-pay | Admitting: Family Medicine

## 2017-06-17 DIAGNOSIS — J069 Acute upper respiratory infection, unspecified: Secondary | ICD-10-CM

## 2017-06-17 MED ORDER — DOXYCYCLINE HYCLATE 100 MG PO TABS: 100.0000 mg | ORAL_TABLET | Freq: Two times a day (BID) | ORAL | 0 refills | Status: DC

## 2017-06-17 MED ORDER — HYDROCOD POLST-CPM POLST ER 10-8 MG/5ML PO SUER: 5.0000 mL | Freq: Two times a day (BID) | ORAL | 0 refills | Status: DC | PRN

## 2017-06-17 NOTE — Telephone Encounter (Signed)
Patient has been advised. KW 

## 2017-06-17 NOTE — Telephone Encounter (Signed)
Patient states that he was advised to Mucinex D, but was advised by pharmacist that he should not take it since he has parkinson disease. Patient reports having a loose stools since starting omnicef, he states that symptoms have not improved much, he states that he still has tightness in his chest and congestion. Patient reports that cough syrup did help with cough but he is out of it now. KW

## 2017-06-17 NOTE — Telephone Encounter (Signed)
I have sent in doxycycline. Rx for cough syrup up front for pickup.

## 2017-06-17 NOTE — Telephone Encounter (Signed)
Pt is requesting to speak with Bob's nurse. Pt stated he had an OV with Bob on 06/12/17 & was given cefdinir (OMNICEF) 300 MG capsule but he doesn't feel like it has helped. Pt request to discuss this with a nurse. Please advise. Thanks TNP

## 2017-06-30 DIAGNOSIS — L820 Inflamed seborrheic keratosis: Secondary | ICD-10-CM | POA: Diagnosis not present

## 2017-06-30 DIAGNOSIS — L812 Freckles: Secondary | ICD-10-CM | POA: Diagnosis not present

## 2017-06-30 DIAGNOSIS — C44612 Basal cell carcinoma of skin of right upper limb, including shoulder: Secondary | ICD-10-CM | POA: Diagnosis not present

## 2017-06-30 DIAGNOSIS — Z85828 Personal history of other malignant neoplasm of skin: Secondary | ICD-10-CM | POA: Diagnosis not present

## 2017-06-30 DIAGNOSIS — L719 Rosacea, unspecified: Secondary | ICD-10-CM | POA: Diagnosis not present

## 2017-06-30 DIAGNOSIS — Z1283 Encounter for screening for malignant neoplasm of skin: Secondary | ICD-10-CM | POA: Diagnosis not present

## 2017-06-30 DIAGNOSIS — D229 Melanocytic nevi, unspecified: Secondary | ICD-10-CM | POA: Diagnosis not present

## 2017-06-30 DIAGNOSIS — L578 Other skin changes due to chronic exposure to nonionizing radiation: Secondary | ICD-10-CM | POA: Diagnosis not present

## 2017-06-30 DIAGNOSIS — L570 Actinic keratosis: Secondary | ICD-10-CM | POA: Diagnosis not present

## 2017-06-30 DIAGNOSIS — L720 Epidermal cyst: Secondary | ICD-10-CM | POA: Diagnosis not present

## 2017-06-30 DIAGNOSIS — L821 Other seborrheic keratosis: Secondary | ICD-10-CM | POA: Diagnosis not present

## 2017-06-30 DIAGNOSIS — D485 Neoplasm of uncertain behavior of skin: Secondary | ICD-10-CM | POA: Diagnosis not present

## 2017-07-01 ENCOUNTER — Ambulatory Visit
Admission: RE | Admit: 2017-07-01 | Discharge: 2017-07-01 | Disposition: A | Discharge status: Home / Self Care | Payer: PPO | Source: Ambulatory Visit | Attending: Family Medicine | Admitting: Family Medicine

## 2017-07-01 ENCOUNTER — Encounter: Payer: Self-pay | Admitting: Family Medicine

## 2017-07-01 ENCOUNTER — Ambulatory Visit: Payer: PPO | Admitting: Family Medicine

## 2017-07-01 VITALS — BP 92/42 | HR 78 | Temp 97.8°F | Resp 17

## 2017-07-01 DIAGNOSIS — S299XXA Unspecified injury of thorax, initial encounter: Secondary | ICD-10-CM

## 2017-07-01 DIAGNOSIS — R0789 Other chest pain: Secondary | ICD-10-CM | POA: Insufficient documentation

## 2017-07-01 DIAGNOSIS — R079 Chest pain, unspecified: Secondary | ICD-10-CM | POA: Diagnosis not present

## 2017-07-01 DIAGNOSIS — W19XXXA Unspecified fall, initial encounter: Secondary | ICD-10-CM | POA: Diagnosis not present

## 2017-07-01 DIAGNOSIS — T1490XA Injury, unspecified, initial encounter: Secondary | ICD-10-CM | POA: Diagnosis present

## 2017-07-01 MED ORDER — HYDROCODONE-ACETAMINOPHEN 5-325 MG PO TABS: ORAL_TABLET | ORAL | 0 refills | Status: DC

## 2017-07-01 NOTE — Progress Notes (Signed)
Subjective:     Patient ID: VON QUINTANAR, male   DOB: 20-Dec-1940, 76 y.o.   MRN: [id number] Chief Complaint  Patient presents with  . Fall    Patient comes in office today after falling yesterday in his home, patient states that he loss his balance and fell on floor. Patient reports pain when breathing, pain in his chest and ribs and shortness of breath. Today patient reports blurred vision and confusion.    HPI Son accompanies and states he may have mistimed medications yesterday. He is holding  the right side of his chest. Accompanied by his son, Travis([phone])  Review of Systems     Objective:   Physical Exam  Constitutional: He appears well-developed and well-nourished. He appears distressed (mild distress holding his right lower ribcage. He is in a wheel chair today).  Pulmonary/Chest: Breath sounds normal. He exhibits tenderness (right lower costochondral area).  Musculoskeletal: He exhibits edema ( no pitting of lower extremities).       Assessment:    1. Chest wall injury, initial encounter - HYDROcodone-acetaminophen (NORCO/VICODIN) 5-325 MG tablet; One every 6 hours as needed for pain  Dispense: 20 tablet; Refill: 0 - DG Chest 2 View; Future    Plan:    Have discontinued HCTZ due to low blood pressure.Family will monitor his bp at home and use pressure stocking for any leg swelling. Further f/u pending x-ray results.

## 2017-07-01 NOTE — Patient Instructions (Addendum)
Stop HCTZ. We will call you with the x-ray report.

## 2017-07-02 DIAGNOSIS — F4321 Adjustment disorder with depressed mood: Secondary | ICD-10-CM | POA: Diagnosis not present

## 2017-07-02 DIAGNOSIS — G20 Parkinson's disease: Secondary | ICD-10-CM | POA: Diagnosis not present

## 2017-07-02 DIAGNOSIS — F0280 Dementia in other diseases classified elsewhere without behavioral disturbance: Secondary | ICD-10-CM | POA: Diagnosis not present

## 2017-07-02 DIAGNOSIS — G4752 REM sleep behavior disorder: Secondary | ICD-10-CM | POA: Diagnosis not present

## 2017-07-02 DIAGNOSIS — G2581 Restless legs syndrome: Secondary | ICD-10-CM | POA: Diagnosis not present

## 2017-07-08 ENCOUNTER — Telehealth: Payer: Self-pay | Admitting: Radiology

## 2017-07-08 NOTE — Telephone Encounter (Signed)
Pt would like to schedule PTNS. Advised pt that he will be contacted to schedule once approved by insurance. Pt voices understanding.

## 2017-07-09 ENCOUNTER — Telehealth: Payer: Self-pay | Admitting: Urology

## 2017-07-09 NOTE — Telephone Encounter (Signed)
apps have been scheduled, I have left a message for the patient to call back to go over them.  Sharyn Lull

## 2017-07-19 ENCOUNTER — Telehealth: Payer: Self-pay | Admitting: Urology

## 2017-07-19 DIAGNOSIS — H40003 Preglaucoma, unspecified, bilateral: Secondary | ICD-10-CM | POA: Diagnosis not present

## 2017-07-19 NOTE — Telephone Encounter (Signed)
I have scheduled his PTNS appts but he has a follow up with you on 10-12-17 but his PTNS falls on 10-13-17 do you want him to keep his follow up and do it then or cx the follow up with you and have his PTNS as scheduled on the 3rd?  Please advise  Sharyn Lull

## 2017-07-19 NOTE — Telephone Encounter (Signed)
No need to see me during PT NS.  I would have him follow-up a few weeks after he completes it.  Hollice Espy, MD

## 2017-07-21 ENCOUNTER — Ambulatory Visit: Payer: PPO | Admitting: Urology

## 2017-07-26 DIAGNOSIS — H40003 Preglaucoma, unspecified, bilateral: Secondary | ICD-10-CM | POA: Diagnosis not present

## 2017-07-28 ENCOUNTER — Ambulatory Visit: Payer: PPO | Admitting: Urology

## 2017-08-04 ENCOUNTER — Ambulatory Visit: Payer: PPO

## 2017-08-11 ENCOUNTER — Ambulatory Visit: Payer: PPO

## 2017-08-18 ENCOUNTER — Ambulatory Visit (INDEPENDENT_AMBULATORY_CARE_PROVIDER_SITE_OTHER): Payer: PPO

## 2017-08-18 DIAGNOSIS — N3941 Urge incontinence: Secondary | ICD-10-CM

## 2017-08-18 NOTE — Progress Notes (Signed)
PTNS  Session # 1  Health & Social Factors:  Caffeine: 1 Alcohol: 0 Daytime voids #per day: 5-6 Night-time voids #per night: 2 Urgency: none Incontinence Episodes #per day: every 2 hours Ankle used: right Treatment Setting: 19 Feeling/ Response: toe flex Comments:   Preformed By: Toniann Fail, LPN   Follow Up: 1 week

## 2017-08-19 DIAGNOSIS — L578 Other skin changes due to chronic exposure to nonionizing radiation: Secondary | ICD-10-CM | POA: Diagnosis not present

## 2017-08-19 DIAGNOSIS — C44612 Basal cell carcinoma of skin of right upper limb, including shoulder: Secondary | ICD-10-CM | POA: Diagnosis not present

## 2017-08-19 DIAGNOSIS — L821 Other seborrheic keratosis: Secondary | ICD-10-CM | POA: Diagnosis not present

## 2017-08-25 ENCOUNTER — Ambulatory Visit: Payer: PPO | Admitting: Urology

## 2017-08-25 DIAGNOSIS — R350 Frequency of micturition: Secondary | ICD-10-CM

## 2017-08-25 NOTE — Progress Notes (Signed)
PTNS  Session # 2  Health & Social Factors: No change Caffeine: 1 Alcohol: 0 Daytime voids #per day: 12-13 Night-time voids #per night: 3-4 Urgency: severe Incontinence Episodes #per day: 1-3 Ankle used: left Treatment Setting: 19 Feeling/ Response: both Comments:   Preformed By: Toniann Fail, LPN

## 2017-09-01 ENCOUNTER — Ambulatory Visit: Payer: PPO

## 2017-09-01 DIAGNOSIS — R350 Frequency of micturition: Secondary | ICD-10-CM | POA: Diagnosis not present

## 2017-09-01 NOTE — Progress Notes (Signed)
PTNS  Session # 3  Health & Social Factors: cough Caffeine: 1 Alcohol: 0 Daytime voids #per day: 5-6 Night-time voids #per night: 3-4 Urgency: strong Incontinence Episodes #per day: 3-4 Ankle used: right Treatment Setting: 7 Feeling/ Response: both Comments: n/a  Preformed By: Fonnie Jarvis, CMA    Follow Up: 1 week

## 2017-09-06 DIAGNOSIS — G20 Parkinson's disease: Secondary | ICD-10-CM | POA: Diagnosis not present

## 2017-09-06 DIAGNOSIS — G2581 Restless legs syndrome: Secondary | ICD-10-CM | POA: Diagnosis not present

## 2017-09-06 DIAGNOSIS — G903 Multi-system degeneration of the autonomic nervous system: Secondary | ICD-10-CM | POA: Diagnosis not present

## 2017-09-06 DIAGNOSIS — F4321 Adjustment disorder with depressed mood: Secondary | ICD-10-CM | POA: Diagnosis not present

## 2017-09-06 DIAGNOSIS — G4752 REM sleep behavior disorder: Secondary | ICD-10-CM | POA: Diagnosis not present

## 2017-09-06 DIAGNOSIS — F0280 Dementia in other diseases classified elsewhere without behavioral disturbance: Secondary | ICD-10-CM | POA: Diagnosis not present

## 2017-09-08 ENCOUNTER — Ambulatory Visit: Payer: PPO | Admitting: Urology

## 2017-09-08 DIAGNOSIS — R350 Frequency of micturition: Secondary | ICD-10-CM

## 2017-09-08 NOTE — Progress Notes (Signed)
PTNS  Session # 4  Health & Social Factors: no change Caffeine: 1 Alcohol: 0 Daytime voids #per day: 6 Night-time voids #per night: 2 Urgency: strong Incontinence Episodes #per day: 3 Ankle used: left Treatment Setting: 19 Feeling/ Response: both Comments: Patient tolerated well  Preformed By: Zara Council, PA-C   Follow Up: 1 week

## 2017-09-13 ENCOUNTER — Ambulatory Visit (INDEPENDENT_AMBULATORY_CARE_PROVIDER_SITE_OTHER): Payer: PPO | Admitting: Family Medicine

## 2017-09-13 ENCOUNTER — Other Ambulatory Visit: Payer: Self-pay

## 2017-09-13 VITALS — BP 110/64 | HR 84 | Temp 97.5°F | Resp 16 | Wt 222.0 lb

## 2017-09-13 DIAGNOSIS — I10 Essential (primary) hypertension: Secondary | ICD-10-CM | POA: Diagnosis not present

## 2017-09-13 DIAGNOSIS — B349 Viral infection, unspecified: Secondary | ICD-10-CM | POA: Diagnosis not present

## 2017-09-13 NOTE — Progress Notes (Signed)
Roger Rodriguez  MRN: [id number] DOB: [birth year]  Subjective:  HPI  The patient is a 77 year old who presents for evaluation of head congestion, slight cough, nasal congestion with bloody congestion, nosebleed last night.  He has had symptoms for about 2 weeks.  He had chills last night but is not sure if it was from fever.  He is coughing a little but thinks it is from head congestion that is draining.  He does not feel he has any chest congestion.  Fall-He also wants to be checked after having a fall about 4 days ago.  He states he went to step on a bug that was going across the floor and did not kill it the first try and when he went to pick up his foot and stomp down again he lost his balance and fell to the right side.  Today he noticed a lump and bruise on his right hip.  He states that when he first fell he just hurt all over.  Then the next he felt it was more in his right shoulder, hip and back.  The shoulder is better today and the back is tender but the hip has significant pain.  The patient would also like to discuss his blood pressure.  He has been getting readings that have ranges from 14-431 systolic and 54-00 diastolic.  He is currently on Carbidopa-Levodopa 25/250mg  and Carbidopa-Levodopa Extended 50/200mg .  His parkinson doctor told him that the medicine could have this effect on his blood pressure but has not given him anything to treat the blood pressure.  He is concerned about the pressure and wants to discuss if it needs to be treated.    Patient Active Problem List   Diagnosis Date Noted  . RUQ pain 10/22/2016  . Depression, major, single episode, moderate ([place]) 10/22/2016  . Renal cyst, right 09/28/2016  . Hepatic steatosis 09/28/2016  . Parkinson's disease ([place]) 09/18/2015  . Restless leg 09/18/2015  . Malignant neoplasm of prostate ([place]) 05/22/2015  . Hepatitis A 04/02/2015  . Allergic rhinitis 04/02/2015  . Arthritis 04/02/2015  . Narrowing of intervertebral disc  space 04/02/2015  . 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 Accumulation of fluid in tissues 04/02/2015  . Essential (primary) hypertension 04/02/2015  . Borderline diabetes 04/02/2015  . Hyperlipidemia 04/02/2015  . BP (high blood pressure) 04/02/2015  . Infected sebaceous cyst 04/02/2015  . Hernia, inguinal 04/02/2015  . Leg pain 04/02/2015  . Lumbar canal stenosis 04/02/2015  . 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 Malignant melanoma ([place]) 04/02/2015  . Arthritis, degenerative 04/02/2015  . Fatigue 04/02/2015  . Idiopathic Parkinson's disease ([place]) 04/02/2015  . Plantar fasciitis 04/02/2015  . CA of prostate ([place]) 04/02/2015  . Benign essential tremor 04/02/2015  . Lower urinary tract infection 04/02/2015  . Degeneration of intervertebral disc of lumbosacral region 05/10/2012    Past Medical History:  Diagnosis Date  . Allergy    allergic rhinitis  . Arthritis    OA  . Chronic kidney disease    stones  . Depression   . Family history of adverse reaction to anesthesia   . Hematuria   . History of kidney stones   . Hyperlipidemia   . Hypertension    no meds since parkinsons meds started  . Incontinence of urine   . Inguinal hernia   . Neuromuscular disorder ([place])    parkinsons  . Overactive bladder   . Parkinson's disease ([place])   . Prostate  cancer ([place])   . Rhinitis, allergic   . Shortness of breath dyspnea    doe secondary to parkinsons    Social History   Socioeconomic History  . Marital status: Married    Spouse name: Not on file  . Number of children: Not on file  . Years of education: Not on file  . Highest education level: Not on file  Social Needs  . Financial resource strain: Not on file  . Food insecurity - worry: Not on file  . Food insecurity - inability: Not on file  . Transportation needs - medical: Not on file  . Transportation needs - non-medical: Not on file  Occupational History  . Not on file  Tobacco Use  . Smoking  status: Former Smoker    Types: Cigars    Last attempt to quit: 11/11/1968    Years since quitting: 48.8  . Smokeless tobacco: Former Systems developer    Types: Chew    Quit date: 11/12/2003  . Tobacco comment: Occassionally, also chewed tobacco till 2005. Quit smoking 1973  Substance and Sexual Activity  . Alcohol use: No    Alcohol/week: 0.0 oz  . Drug use: No  . Sexual activity: No  Other Topics Concern  . Not on file  Social History Narrative  . Not on file    Outpatient Encounter Medications as of 09/13/2017  Medication Sig  . budesonide (RHINOCORT AQUA) 32 MCG/ACT nasal spray USE 1 PUFF IN EACH NOSTRIL EVERY DAY AS NEEDED  . carbidopa-levodopa (SINEMET CR) 50-200 MG per tablet TAKE 2 TABLET BY MOUTH 2 (four) TIMES DAILY.  . carbidopa-levodopa (SINEMET IR) 25-250 MG tablet Take 1 tablet by mouth 3 (three) times daily.  . celecoxib (CELEBREX) 200 MG capsule Take 1 capsule (200 mg total) by mouth 2 (two) times daily.  . Cholecalciferol (VITAMIN D) 2000 UNITS tablet Take 1,000 Units by mouth daily.   [name] erythromycin ophthalmic ointment Place 1 application into both eyes at bedtime.   . gabapentin (NEURONTIN) 400 MG capsule Take 400 mg by mouth 3 (three) times daily.   [name] ketoconazole (NIZORAL) 2 % shampoo WASH BEARD AND SCALP 3 DAYS A WEEK, LET SIT SEVERAL MINUTES AND RINSE OUT  . lisinopril (PRINIVIL,ZESTRIL) 2.5 MG tablet TAKE 1 TABLET (2.5 MG TOTAL) BY MOUTH 2 (TWO) TIMES DAILY.  [name] loratadine (CLARITIN) 10 MG tablet Take 10 mg by mouth daily.  . mirabegron ER (MYRBETRIQ) 25 MG TB24 tablet Take 1 tablet (25 mg total) 2 (two) times daily by mouth.  . pramipexole (MIRAPEX) 0.5 MG tablet Pramipexole 0.5 mg in the morning, 0.5 mg in the afternoon and 0.5 mg at night.  . vitamin C (ASCORBIC ACID) 500 MG tablet Take 500 mg by mouth daily.  [name] VITAMIN E NATURAL PO Take 1 capsule by mouth daily.   . Amantadine HCl 100 MG tablet Take 1 tablet by mouth 2 (two) times daily.  . [DISCONTINUED] atropine 1 %  ophthalmic solution PLACE 1 DROP INTO BOTH EYES ONCE FOR 1 DOSE.  . [DISCONTINUED] chlorpheniramine-HYDROcodone (TUSSIONEX PENNKINETIC ER) 10-8 MG/5ML SUER Take 5 mLs by mouth every 12 (twelve) hours as needed for cough.  . [DISCONTINUED] HYDROcodone-acetaminophen (NORCO/VICODIN) 5-325 MG tablet One every 6 hours as needed for pain  . [DISCONTINUED] rOPINIRole (REQUIP) 0.5 MG tablet Take 0.5 mg by mouth 3 (three) times daily.    No facility-administered encounter medications on file as of 09/13/2017.     No Known Allergies  Review of Systems  Constitutional: Positive  for chills and malaise/fatigue. Negative for diaphoresis, fever and weight loss.  HENT: Positive for congestion, ear discharge, nosebleeds, sinus pain (using hot compresses to alleviate the pain in his head) and tinnitus (roaring in his ears all the times.). Negative for ear pain, hearing loss and sore throat.   Eyes: Positive for double vision (for about 1 hour after he fell. But he denies hitting his head.). Negative for blurred vision, photophobia, pain, discharge and redness.  Respiratory: Positive for cough (slight) and sputum production (slight). Negative for shortness of breath and wheezing.   Cardiovascular: Positive for leg swelling (wearing compression socck normally but not today.). Negative for chest pain, palpitations, orthopnea and claudication.  Gastrointestinal: Negative.   Skin: Negative.   Neurological: Positive for tremors and weakness.  Endo/Heme/Allergies: Negative.   Psychiatric/Behavioral: Negative.      Fall Risk  09/13/2017 10/22/2016 10/02/2015 04/02/2015  Falls in the past year? Yes Yes Yes No  Number falls in past yr: 2 or more 2 or more 2 or more -  Injury with Fall? Yes No No -  Risk Factor Category  High Fall Risk - - -  Risk for fall due to : History of fall(s);Impaired balance/gait;Impaired mobility - Impaired balance/gait -  Follow up Falls evaluation completed;Education provided;Falls prevention  discussed Falls prevention discussed - -     Objective:  BP 110/64 (BP Location: Right Arm, Patient Position: Sitting, Cuff Size: Normal)   Pulse 84   Temp (!) 97.5 F (36.4 C) (Oral)   Resp 16   Wt 222 lb (100.7 kg)   BMI 30.96 kg/m   Physical Exam  Constitutional: He is oriented to person, place, and time and well-developed, well-nourished, and in no distress.  HENT:  Head: Normocephalic and atraumatic.  Right Ear: External ear normal.  Left Ear: External ear normal.  Nose: Nose normal.  Mouth/Throat: Oropharynx is clear and moist.  Mild maxillary sinus tenderness. Slight raw areas in nostrils.  Eyes: Conjunctivae are normal. Pupils are equal, round, and reactive to light.  Neck: Normal range of motion.  Cardiovascular: Normal rate, regular rhythm and normal heart sounds.  Pulmonary/Chest: Effort normal and breath sounds normal.  Abdominal: Soft.  Musculoskeletal:  Hematoma right lateral lower buttocks.  Neurological: He is alert and oriented to person, place, and time.  Parkinsons stigmata.  Skin: Skin is warm and dry.  Psychiatric: Mood, memory, affect and judgment normal.    Assessment and Plan :   1. Viral syndrome If patient worsens he is to call for antibiotic to be sent to pharmacy.  2. Essential (primary) hypertension Patient is to decrease Lisinopril to once daily 3.PD 4.Falls secondary to Parkinsons  I have done the exam and reviewed the chart and it is accurate to the best of my knowledge. Development worker, community has been used and  any errors in dictation or transcription are unintentional. Miguel Aschoff M.D. [facility]

## 2017-09-13 NOTE — Patient Instructions (Signed)
Take Lisinopril once a day and not twice a day.

## 2017-09-15 ENCOUNTER — Ambulatory Visit: Payer: PPO | Admitting: Urology

## 2017-09-15 DIAGNOSIS — R350 Frequency of micturition: Secondary | ICD-10-CM

## 2017-09-15 NOTE — Progress Notes (Signed)
PTNS  Session # 5  Health & Social Factors: same Caffeine: 1 Alcohol: 0 Daytime voids #per day: 6 Night-time voids #per night: 2 Urgency: severe Incontinence Episodes #per day: 2-3 Ankle used: right Treatment Setting: 6 Feeling/ Response: toe flex Comments: none  Preformed By: Fonnie Jarvis, CMA    Follow Up: next week

## 2017-09-16 ENCOUNTER — Telehealth: Payer: Self-pay | Admitting: Family Medicine

## 2017-09-16 NOTE — Telephone Encounter (Signed)
Message LTCMB regarding need to schedule MWV prior to physical appt on 10/26/17.

## 2017-09-22 ENCOUNTER — Ambulatory Visit: Payer: PPO | Admitting: Urology

## 2017-09-22 DIAGNOSIS — N3941 Urge incontinence: Secondary | ICD-10-CM | POA: Diagnosis not present

## 2017-09-22 NOTE — Progress Notes (Signed)
PTNS  Session # 6  Health & Social Factors: no change Caffeine: 1 Alcohol: 0 Daytime voids #per day: 6 Night-time voids #per night: 2-3 Urgency: severe Incontinence Episodes #per day: 2-3 Ankle used: left Treatment Setting: 8 Feeling/ Response: Both Comments: Patient tolerated well.  Preformed By: Elberta Leatherwood, CMA   Follow Up: 1 week #7

## 2017-09-29 ENCOUNTER — Ambulatory Visit (INDEPENDENT_AMBULATORY_CARE_PROVIDER_SITE_OTHER): Payer: PPO

## 2017-09-29 DIAGNOSIS — N3941 Urge incontinence: Secondary | ICD-10-CM

## 2017-09-29 DIAGNOSIS — R3915 Urgency of urination: Secondary | ICD-10-CM

## 2017-09-29 NOTE — Progress Notes (Signed)
PTNS  Session # 7  Health & Social Factors: Change Caffeine: 1 Alcohol: 0 Daytime voids #per day: 6 Night-time voids #per night: 1 Urgency: severe Incontinence Episodes #per day: 2 Ankle used: right Treatment Setting: 19 Feeling/ Response: sensory Comments:   Preformed By: Toniann Fail, LPN   Follow Up: 1 week

## 2017-10-06 ENCOUNTER — Ambulatory Visit: Payer: PPO

## 2017-10-12 ENCOUNTER — Other Ambulatory Visit: Payer: Self-pay

## 2017-10-12 ENCOUNTER — Emergency Department: Payer: PPO

## 2017-10-12 ENCOUNTER — Ambulatory Visit: Payer: PPO | Admitting: Urology

## 2017-10-12 ENCOUNTER — Inpatient Hospital Stay
Admission: EM | Admit: 2017-10-12 | Discharge: 2017-10-17 | DRG: 682 | Disposition: A | Discharge status: Skilled Nursing Facility | Payer: PPO | Attending: Internal Medicine | Admitting: Internal Medicine

## 2017-10-12 ENCOUNTER — Encounter: Payer: Self-pay | Admitting: Emergency Medicine

## 2017-10-12 DIAGNOSIS — G20 Parkinson's disease: Secondary | ICD-10-CM | POA: Diagnosis present

## 2017-10-12 DIAGNOSIS — Z8546 Personal history of malignant neoplasm of prostate: Secondary | ICD-10-CM

## 2017-10-12 DIAGNOSIS — M25569 Pain in unspecified knee: Secondary | ICD-10-CM

## 2017-10-12 DIAGNOSIS — N189 Chronic kidney disease, unspecified: Secondary | ICD-10-CM | POA: Diagnosis not present

## 2017-10-12 DIAGNOSIS — Z87891 Personal history of nicotine dependence: Secondary | ICD-10-CM

## 2017-10-12 DIAGNOSIS — E785 Hyperlipidemia, unspecified: Secondary | ICD-10-CM | POA: Diagnosis not present

## 2017-10-12 DIAGNOSIS — R509 Fever, unspecified: Secondary | ICD-10-CM

## 2017-10-12 DIAGNOSIS — Z96651 Presence of right artificial knee joint: Secondary | ICD-10-CM | POA: Diagnosis not present

## 2017-10-12 DIAGNOSIS — E860 Dehydration: Secondary | ICD-10-CM | POA: Diagnosis not present

## 2017-10-12 DIAGNOSIS — J189 Pneumonia, unspecified organism: Secondary | ICD-10-CM

## 2017-10-12 DIAGNOSIS — I10 Essential (primary) hypertension: Secondary | ICD-10-CM | POA: Diagnosis present

## 2017-10-12 DIAGNOSIS — S8391XA Sprain of unspecified site of right knee, initial encounter: Secondary | ICD-10-CM | POA: Diagnosis not present

## 2017-10-12 DIAGNOSIS — R531 Weakness: Secondary | ICD-10-CM

## 2017-10-12 DIAGNOSIS — Z79899 Other long term (current) drug therapy: Secondary | ICD-10-CM

## 2017-10-12 DIAGNOSIS — N179 Acute kidney failure, unspecified: Secondary | ICD-10-CM | POA: Diagnosis present

## 2017-10-12 DIAGNOSIS — R278 Other lack of coordination: Secondary | ICD-10-CM | POA: Diagnosis not present

## 2017-10-12 DIAGNOSIS — R4182 Altered mental status, unspecified: Secondary | ICD-10-CM | POA: Diagnosis not present

## 2017-10-12 DIAGNOSIS — R1310 Dysphagia, unspecified: Secondary | ICD-10-CM | POA: Diagnosis not present

## 2017-10-12 DIAGNOSIS — W1830XA Fall on same level, unspecified, initial encounter: Secondary | ICD-10-CM | POA: Diagnosis present

## 2017-10-12 DIAGNOSIS — G9340 Encephalopathy, unspecified: Secondary | ICD-10-CM | POA: Diagnosis not present

## 2017-10-12 DIAGNOSIS — M25561 Pain in right knee: Secondary | ICD-10-CM | POA: Diagnosis not present

## 2017-10-12 DIAGNOSIS — S0990XA Unspecified injury of head, initial encounter: Secondary | ICD-10-CM | POA: Diagnosis not present

## 2017-10-12 DIAGNOSIS — M25551 Pain in right hip: Secondary | ICD-10-CM | POA: Diagnosis not present

## 2017-10-12 DIAGNOSIS — G9341 Metabolic encephalopathy: Secondary | ICD-10-CM | POA: Diagnosis not present

## 2017-10-12 DIAGNOSIS — R52 Pain, unspecified: Secondary | ICD-10-CM

## 2017-10-12 DIAGNOSIS — M25559 Pain in unspecified hip: Secondary | ICD-10-CM

## 2017-10-12 DIAGNOSIS — F322 Major depressive disorder, single episode, severe without psychotic features: Secondary | ICD-10-CM | POA: Diagnosis not present

## 2017-10-12 DIAGNOSIS — J9811 Atelectasis: Secondary | ICD-10-CM | POA: Diagnosis not present

## 2017-10-12 DIAGNOSIS — I959 Hypotension, unspecified: Secondary | ICD-10-CM | POA: Diagnosis not present

## 2017-10-12 DIAGNOSIS — A419 Sepsis, unspecified organism: Secondary | ICD-10-CM | POA: Diagnosis not present

## 2017-10-12 DIAGNOSIS — N3281 Overactive bladder: Secondary | ICD-10-CM | POA: Diagnosis not present

## 2017-10-12 DIAGNOSIS — S299XXA Unspecified injury of thorax, initial encounter: Secondary | ICD-10-CM | POA: Diagnosis not present

## 2017-10-12 DIAGNOSIS — R7881 Bacteremia: Secondary | ICD-10-CM | POA: Diagnosis not present

## 2017-10-12 DIAGNOSIS — S8391XD Sprain of unspecified site of right knee, subsequent encounter: Secondary | ICD-10-CM | POA: Diagnosis not present

## 2017-10-12 DIAGNOSIS — M6281 Muscle weakness (generalized): Secondary | ICD-10-CM | POA: Diagnosis not present

## 2017-10-12 DIAGNOSIS — R0600 Dyspnea, unspecified: Secondary | ICD-10-CM | POA: Diagnosis not present

## 2017-10-12 DIAGNOSIS — Z9114 Patient's other noncompliance with medication regimen: Secondary | ICD-10-CM | POA: Diagnosis not present

## 2017-10-12 DIAGNOSIS — F329 Major depressive disorder, single episode, unspecified: Secondary | ICD-10-CM | POA: Diagnosis not present

## 2017-10-12 DIAGNOSIS — S79911A Unspecified injury of right hip, initial encounter: Secondary | ICD-10-CM | POA: Diagnosis not present

## 2017-10-12 DIAGNOSIS — R2681 Unsteadiness on feet: Secondary | ICD-10-CM | POA: Diagnosis not present

## 2017-10-12 DIAGNOSIS — Z7401 Bed confinement status: Secondary | ICD-10-CM | POA: Diagnosis not present

## 2017-10-12 DIAGNOSIS — R4020 Unspecified coma: Secondary | ICD-10-CM | POA: Diagnosis not present

## 2017-10-12 DIAGNOSIS — S8991XA Unspecified injury of right lower leg, initial encounter: Secondary | ICD-10-CM | POA: Diagnosis not present

## 2017-10-12 DIAGNOSIS — S8291XA Unspecified fracture of right lower leg, initial encounter for closed fracture: Secondary | ICD-10-CM | POA: Diagnosis not present

## 2017-10-12 LAB — CBC
HCT: 37.9 % — ABNORMAL LOW (ref 40.0–52.0)
Hemoglobin: 12.6 g/dL — ABNORMAL LOW (ref 13.0–18.0)
MCH: 31.8 pg (ref 26.0–34.0)
MCHC: 33.2 g/dL (ref 32.0–36.0)
MCV: 95.6 fL (ref 80.0–100.0)
Platelets: 181 10*3/uL (ref 150–440)
RBC: 3.97 MIL/uL — ABNORMAL LOW (ref 4.40–5.90)
RDW: 14.3 % (ref 11.5–14.5)
WBC: 8.4 10*3/uL (ref 3.8–10.6)

## 2017-10-12 LAB — TROPONIN I: Troponin I: <0.03 ng/mL (ref <0.03)

## 2017-10-12 LAB — BASIC METABOLIC PANEL
Anion gap: 8 (ref 5–15)
BUN: 29 mg/dL — ABNORMAL HIGH (ref 6–20)
CO2: 27 mmol/L (ref 22–32)
Calcium: 8.4 mg/dL — ABNORMAL LOW (ref 8.9–10.3)
Chloride: 100 mmol/L — ABNORMAL LOW (ref 101–111)
Creatinine, Ser: 2.29 mg/dL — ABNORMAL HIGH (ref 0.61–1.24)
GFR calc Af Amer: 30 mL/min — ABNORMAL LOW (ref >60)
GFR calc non Af Amer: 26 mL/min — ABNORMAL LOW (ref >60)
Glucose, Bld: 182 mg/dL — ABNORMAL HIGH (ref 65–99)
Potassium: 4.3 mmol/L (ref 3.5–5.1)
Sodium: 135 mmol/L (ref 135–145)

## 2017-10-12 LAB — GLUCOSE, CAPILLARY: Glucose-Capillary: 123 mg/dL — ABNORMAL HIGH (ref 65–99)

## 2017-10-12 LAB — CK: Total CK: 552 U/L — ABNORMAL HIGH (ref 49–397)

## 2017-10-12 MED ORDER — SODIUM CHLORIDE 0.9 % IV SOLN
INTRAVENOUS | Status: DC
  Administered 2017-10-13 – 2017-10-14 (×2): via INTRAVENOUS

## 2017-10-12 MED ORDER — ATROPINE SULFATE 1 % OP SOLN
1.0000 [drp] | Freq: Once | OPHTHALMIC | Status: DC
  Filled 2017-10-12: qty 2

## 2017-10-12 MED ORDER — ACETAMINOPHEN 650 MG RE SUPP: 650.0000 mg | Freq: Four times a day (QID) | RECTAL | Status: DC | PRN

## 2017-10-12 MED ORDER — GABAPENTIN 300 MG PO CAPS
400.0000 mg | ORAL_CAPSULE | Freq: Three times a day (TID) | ORAL | Status: DC
  Administered 2017-10-13 – 2017-10-17 (×12): 400 mg via ORAL
  Filled 2017-10-12 (×13): qty 1

## 2017-10-12 MED ORDER — BISACODYL 5 MG PO TBEC: 5.0000 mg | DELAYED_RELEASE_TABLET | Freq: Every day | ORAL | Status: DC | PRN

## 2017-10-12 MED ORDER — VITAMIN C 500 MG PO TABS
500.0000 mg | ORAL_TABLET | Freq: Every day | ORAL | Status: DC
  Administered 2017-10-13 – 2017-10-17 (×5): 500 mg via ORAL
  Filled 2017-10-12 (×5): qty 1

## 2017-10-12 MED ORDER — LORATADINE 10 MG PO TABS
10.0000 mg | ORAL_TABLET | Freq: Every day | ORAL | Status: DC
  Administered 2017-10-13 – 2017-10-17 (×5): 10 mg via ORAL
  Filled 2017-10-12 (×5): qty 1

## 2017-10-12 MED ORDER — HEPARIN SODIUM (PORCINE) 5000 UNIT/ML IJ SOLN
5000.0000 [IU] | Freq: Three times a day (TID) | INTRAMUSCULAR | Status: DC
  Administered 2017-10-13 – 2017-10-17 (×13): 5000 [IU] via SUBCUTANEOUS
  Filled 2017-10-12 (×13): qty 1

## 2017-10-12 MED ORDER — AMANTADINE HCL 50 MG/5ML PO SYRP
100.0000 mg | ORAL_SOLUTION | Freq: Two times a day (BID) | ORAL | Status: DC
  Filled 2017-10-12 (×11): qty 10

## 2017-10-12 MED ORDER — DOCUSATE SODIUM 100 MG PO CAPS
100.0000 mg | ORAL_CAPSULE | Freq: Two times a day (BID) | ORAL | Status: DC
  Administered 2017-10-13 – 2017-10-17 (×9): 100 mg via ORAL
  Filled 2017-10-12 (×9): qty 1

## 2017-10-12 MED ORDER — HYDROCODONE-ACETAMINOPHEN 5-325 MG PO TABS
1.0000 | ORAL_TABLET | ORAL | Status: DC | PRN
  Administered 2017-10-13: 1 via ORAL
  Administered 2017-10-13 – 2017-10-15 (×6): 2 via ORAL
  Administered 2017-10-15: 1 via ORAL
  Filled 2017-10-12: qty 1
  Filled 2017-10-12 (×5): qty 2
  Filled 2017-10-12: qty 1
  Filled 2017-10-12 (×2): qty 2

## 2017-10-12 MED ORDER — ACETAMINOPHEN 325 MG PO TABS
650.0000 mg | ORAL_TABLET | Freq: Four times a day (QID) | ORAL | Status: DC | PRN
  Administered 2017-10-13 – 2017-10-16 (×3): 650 mg via ORAL
  Filled 2017-10-12 (×3): qty 2

## 2017-10-12 MED ORDER — ONDANSETRON HCL 4 MG PO TABS: 4.0000 mg | ORAL_TABLET | Freq: Four times a day (QID) | ORAL | Status: DC | PRN

## 2017-10-12 MED ORDER — AMANTADINE HCL 100 MG PO CAPS
100.0000 mg | ORAL_CAPSULE | Freq: Two times a day (BID) | ORAL | Status: DC
  Filled 2017-10-12: qty 1

## 2017-10-12 MED ORDER — VITAMIN D 1000 UNITS PO TABS
1000.0000 [IU] | ORAL_TABLET | Freq: Every day | ORAL | Status: DC
  Administered 2017-10-13 – 2017-10-17 (×5): 1000 [IU] via ORAL
  Filled 2017-10-12 (×5): qty 1

## 2017-10-12 MED ORDER — FLUTICASONE PROPIONATE 50 MCG/ACT NA SUSP
1.0000 | Freq: Every day | NASAL | Status: DC
  Administered 2017-10-13 – 2017-10-17 (×5): 1 via NASAL
  Filled 2017-10-12: qty 16

## 2017-10-12 MED ORDER — SODIUM CHLORIDE 0.9 % IV BOLUS
1000.0000 mL | Freq: Once | INTRAVENOUS | Status: AC
  Administered 2017-10-12: 1000 mL via INTRAVENOUS

## 2017-10-12 MED ORDER — ONDANSETRON HCL 4 MG/2ML IJ SOLN: 4.0000 mg | Freq: Four times a day (QID) | INTRAMUSCULAR | Status: DC | PRN

## 2017-10-12 MED ORDER — CARBIDOPA-LEVODOPA ER 50-200 MG PO TBCR
2.0000 | EXTENDED_RELEASE_TABLET | Freq: Four times a day (QID) | ORAL | Status: DC
  Administered 2017-10-13 – 2017-10-17 (×17): 2 via ORAL
  Filled 2017-10-12 (×24): qty 2

## 2017-10-12 MED ORDER — TRAZODONE HCL 50 MG PO TABS
25.0000 mg | ORAL_TABLET | Freq: Every evening | ORAL | Status: DC | PRN
  Administered 2017-10-13: 25 mg via ORAL
  Filled 2017-10-12: qty 1

## 2017-10-12 NOTE — ED Notes (Signed)
Patient transported to X-ray 

## 2017-10-12 NOTE — ED Triage Notes (Signed)
Pt from home via [facility], pt has weakness and fell. Family states that they talked to him around noon and he was fine, they called him around 6 and he had fallen. Pt has Parkinsons has garbled speech, but it more so than normal.

## 2017-10-12 NOTE — ED Notes (Signed)
Report called to 1C [place] RN. NAD. VSS. Family at bedside.

## 2017-10-12 NOTE — H&P (Addendum)
[facility] [place] NAME: Roger Rodriguez    MR#:  [id number]  DATE OF BIRTH:  1940/10/21  DATE OF ADMISSION:  10/12/2017  PRIMARY CARE PHYSICIAN: [name]., MD   REQUESTING/REFERRING PHYSICIAN:   CHIEF COMPLAINT:   Chief Complaint  Patient presents with  . Hypotension  . Fall  . Weakness    HISTORY OF PRESENT ILLNESS: Roger Rodriguez  is a 77 y.o. male with a known history of Parkinson's disease, hypertension, overactive bladder and prostate cancer in the past, status post surgical treatment. Patient, himself, is not able to provide history due to confusion.  Most of the information was taken from reviewing the medical records and from discussion with the family and the emergency room physician.  Patient was brought to emergency room for acute onset of confusion, lethargy, generalized weakness and fall, at home.  Per family, patient is acting more confused and he seems to be very weak and with slurred speech in the past 24 hrs. Patient lives by himself and he usually manages his own medications.  The only new medication added approximately 4-6 weeks ago was Myrbetriq, started by urology for overactive bladder.  Patient complains of right hip pain, status post fall at home. From reviewing the neurology notes and EMR, however, it seems the patient has had issues with low blood pressure, poor balance, falls, intermittent confusion and noncompliance with his medication for the past 6 months or so.  Blood test done in the emergency room are remarkable for elevated creatinine level at 2.29.  Hemoglobin is 12.6.  Glucose level is 182.  Brain CT scan and chest x-ray, reviewed by myself, are unremarkable.  Patient is admitted for further evaluation and treatment.   PAST MEDICAL HISTORY:   Past Medical History:  Diagnosis Date  . Allergy    allergic rhinitis  . Arthritis    OA  . Chronic kidney disease    stones  . Depression   .  Family history of adverse reaction to anesthesia   . Hematuria   . History of kidney stones   . Hyperlipidemia   . Hypertension    no meds since parkinsons meds started  . Incontinence of urine   . Inguinal hernia   . Neuromuscular disorder ([place])    parkinsons  . Overactive bladder   . Parkinson's disease ([place])   . Prostate cancer ([place])   . Rhinitis, allergic   . Shortness of breath dyspnea    doe secondary to parkinsons    PAST SURGICAL HISTORY:  Past Surgical History:  Procedure Laterality Date  . BACK SURGERY  2014  . CYSTOSCOPY WITH STENT PLACEMENT Right 01/13/2016   Procedure: CYSTOSCOPY WITH STENT PLACEMENT;  Surgeon: Hollice Espy, MD;  Location: [facility] ORS;  Service: Urology;  Laterality: Right;  . CYSTOSCOPY/URETEROSCOPY/HOLMIUM LASER/STENT PLACEMENT Left 11/20/2015   Procedure: CYSTOSCOPY/URETEROSCOPY/HOLMIUM LASER/STENT PLACEMENT;  Surgeon: Hollice Espy, MD;  Location: [facility] ORS;  Service: Urology;  Laterality: Left;  . EYE SURGERY    . FRACTURE SURGERY      ankle   . H/O arthroscopy of right knee  2015  . H/O radical protatectomy    . HERNIA REPAIR    . JOINT REPLACEMENT     right knee  . LITHOTRIPSY  2010  . Rotator Cuff tear  1993   Repaired  . URETEROSCOPY WITH HOLMIUM LASER LITHOTRIPSY  2002?  [name] URETEROSCOPY WITH HOLMIUM LASER LITHOTRIPSY Right 01/13/2016   Procedure: URETEROSCOPY  WITH HOLMIUM LASER LITHOTRIPSY;  Surgeon: Hollice Espy, MD;  Location: [facility] ORS;  Service: Urology;  Laterality: Right;  [name] VASECTOMY     History    SOCIAL HISTORY:  Social History   Tobacco Use  . Smoking status: Former Smoker    Types: Cigars    Last attempt to quit: 11/11/1968    Years since quitting: 48.9  . Smokeless tobacco: Former Systems developer    Types: Chew    Quit date: 11/12/2003  . Tobacco comment: Occassionally, also chewed tobacco till 2005. Quit smoking 1973  Substance Use Topics  . Alcohol use: No    Alcohol/week: 0.0 oz    FAMILY HISTORY:  Family History   Problem Relation Age of Onset  . Dementia Mother   . Cerebral aneurysm Father   . Bladder Cancer Paternal Uncle   . Liver cancer Sister   . Prostate cancer Neg Hx   . Kidney cancer Neg Hx     DRUG ALLERGIES: No Known Allergies  REVIEW OF SYSTEMS:   Unable to obtain, due to patient being confused.  MEDICATIONS AT HOME:  Prior to Admission medications   Medication Sig Start Date End Date Taking? Authorizing Provider  atropine 1 % ophthalmic solution Place 1 drop into both eyes once. For one dose   Yes [provider]  budesonide (RHINOCORT AQUA) 32 MCG/ACT nasal spray USE 1 PUFF IN EACH NOSTRIL EVERY DAY AS NEEDED Patient taking differently: USE 1 PUFF IN EACH NOSTRIL EVERY DAY as needed 06/23/16  Yes [name]., MD  carbidopa-levodopa (SINEMET CR) 50-200 MG per tablet TAKE 2 TABLET BY MOUTH 4 (four) TIMES DAILY. 03/21/15  Yes [provider]  carbidopa-levodopa (SINEMET IR) 25-250 MG tablet Take 1 tablet by mouth 3 (three) times daily.   Yes [provider]  cholecalciferol (VITAMIN D) 1000 units tablet Take 1,000 Units by mouth daily.  05/19/12  Yes [provider]  gabapentin (NEURONTIN) 400 MG capsule Take 400 mg by mouth 3 (three) times daily.  03/26/15  Yes [provider]  HYDROcodone-acetaminophen (NORCO/VICODIN) 5-325 MG tablet Take 1 tablet by mouth every 6 (six) hours as needed for moderate pain.   Yes [provider]  ketoconazole (NIZORAL) 2 % shampoo WASH BEARD AND SCALP 3 DAYS A WEEK, LET SIT SEVERAL MINUTES AND RINSE OUT 06/18/17  Yes [provider]  lisinopril (PRINIVIL,ZESTRIL) 2.5 MG tablet TAKE 1 TABLET (2.5 MG TOTAL) BY MOUTH 2 (TWO) TIMES DAILY. Patient taking differently: Take 2.5 mg by mouth daily.  04/09/17  Yes [name]., MD  loratadine (CLARITIN) 10 MG tablet Take 10 mg by mouth daily. 05/19/12  Yes [provider]  pramipexole (MIRAPEX) 0.5 MG tablet Take one tablet by  mouth twice daily 11/30/16  Yes [provider]  vitamin C (ASCORBIC ACID) 500 MG tablet Take 500 mg by mouth daily.   Yes [provider]  VITAMIN E NATURAL PO Take 1 capsule by mouth daily.  05/19/12  Yes [provider]  Amantadine HCl 100 MG tablet Take 1 tablet by mouth 2 (two) times daily. 04/21/17 05/21/17  [provider]  erythromycin ophthalmic ointment Place 1 application into both eyes at bedtime.  12/31/15   [provider]      PHYSICAL EXAMINATION:   VITAL SIGNS: Blood pressure 118/69, pulse 92, temperature 97.7 F (36.5 C), temperature source Oral, resp. rate (!) 22, height 5\' 11"  (1.803 m), weight 111.1 kg (245 lb), SpO2 100 %.  GENERAL:  77 y.o.-year-old patient lying in the bed with no acute distress.  EYES: Pupils equal, round, reactive to light and accommodation. No scleral icterus.  HEENT: Head atraumatic, normocephalic. Oropharynx and nasopharynx clear.  NECK:  Supple, no jugular venous distention. No thyroid enlargement, no tenderness.  LUNGS: Reduced breath sounds bilaterally, no wheezing, rales,rhonchi or crepitation. No use of accessory muscles of respiration.  CARDIOVASCULAR: S1, S2 normal. No S3/S4.  ABDOMEN: Soft, nontender, nondistended. Bowel sounds present. No organomegaly or mass.  EXTREMITIES: 2+ bilateral, chronic pedal edema.  There is tenderness and reduced range of motion at the right hip joint. NEUROLOGIC: No focal weakness. Gait not checked, due to patient being too weak.  PSYCHIATRIC: The patient is alert, but confused, lethargic.  SKIN: No obvious rash, lesion, or ulcer.   LABORATORY PANEL:   CBC Recent Labs  Lab 10/12/17 1936  WBC 8.4  HGB 12.6*  HCT 37.9*  PLT 181  MCV 95.6  MCH 31.8  MCHC 33.2  RDW 14.3   ------------------------------------------------------------------------------------------------------------------  Chemistries  Recent Labs  Lab 10/12/17 1936  NA 135  K 4.3  CL  100*  CO2 27  GLUCOSE 182*  BUN 29*  CREATININE 2.29*  CALCIUM 8.4*   ------------------------------------------------------------------------------------------------------------------ estimated creatinine clearance is 34.2 mL/min (A) (by C-G formula based on SCr of 2.29 mg/dL (H)). ------------------------------------------------------------------------------------------------------------------ No results for input(s): TSH, T4TOTAL, T3FREE, THYROIDAB in the last 72 hours.  Invalid input(s): FREET3   Coagulation profile No results for input(s): INR, PROTIME in the last 168 hours. ------------------------------------------------------------------------------------------------------------------- No results for input(s): DDIMER in the last 72 hours. -------------------------------------------------------------------------------------------------------------------  Cardiac Enzymes Recent Labs  Lab 10/12/17 1936  TROPONINI <0.03   ------------------------------------------------------------------------------------------------------------------ Invalid input(s): POCBNP  ---------------------------------------------------------------------------------------------------------------  Urinalysis    Component Value Date/Time   COLORURINE Yellow 03/21/2014 1433   APPEARANCEUR Clear 05/04/2016 1042   LABSPEC 1.013 03/21/2014 1433   PHURINE 7.0 03/21/2014 1433   GLUCOSEU Negative 05/04/2016 1042   GLUCOSEU Negative 03/21/2014 1433   HGBUR Negative 03/21/2014 1433   BILIRUBINUR Negative 05/04/2016 1042   BILIRUBINUR Negative 03/21/2014 1433   KETONESUR Negative 03/21/2014 1433   PROTEINUR Trace (A) 05/04/2016 1042   PROTEINUR Negative 03/21/2014 1433   NITRITE Negative 05/04/2016 1042   NITRITE Negative 03/21/2014 1433   LEUKOCYTESUR Negative 05/04/2016 1042   LEUKOCYTESUR Trace 03/21/2014 1433     RADIOLOGY: Dg Chest 2 View  Result Date: 10/12/2017 CLINICAL DATA:  Altered  mental status with fall EXAM: CHEST - 2 VIEW COMPARISON:  07/01/2017 FINDINGS: Degenerative changes of the spine. Patchy atelectasis at the bases. No pleural effusion. Stable cardiomediastinal silhouette. No pneumothorax. IMPRESSION: No active cardiopulmonary disease. Low lung volumes with patchy atelectasis at the bases. Electronically Signed   By: Donavan Foil M.D.   On: 10/12/2017 19:56   Ct Head Wo Contrast  Result Date: 10/12/2017 CLINICAL DATA:  Weakness and recent fall EXAM: CT HEAD WITHOUT CONTRAST TECHNIQUE: Contiguous axial images were obtained from the base of the skull through the vertex without intravenous contrast. COMPARISON:  04/12/2013 FINDINGS: Brain: No evidence of acute infarction, hemorrhage, hydrocephalus, extra-axial collection or mass lesion/mass effect. Cavum septum pellucidum is noted and stable. Vascular: No hyperdense vessel or unexpected calcification. Skull: Normal. Negative for fracture or focal lesion. Sinuses/Orbits: No acute finding. Other: None. IMPRESSION: No acute abnormality noted. Electronically Signed   By: Inez Catalina M.D.   On: 10/12/2017 20:10    EKG: Orders placed or performed during the hospital encounter of 10/12/17  . ED EKG  .  ED EKG    IMPRESSION AND PLAN:  1.  Acute encephalopathy, likely metabolic secondary to acute renal failure.  We will check MRI of the brain to rule out stroke.  We will start IV fluids and continue supportive measures, monitor clinically closely.  Will hold medications with sedative potential.  Will discontinue Myrbetriq, due to increased drowsiness as side effect. 2. ARF, likely prerenal, secondary to poor p.o. Intake.  Creatinine level is 2.29, elevated from 1.02 at baseline.  We will start gentle IV hydration and continue to monitor kidney function closely.  Avoid nephrotoxic medications. 3.  Generalized weakness and unstable gait, likely multifactorial, secondary to acute renal failure and worsening Parkinson's disease.   Will have PT and OT evaluate the patient.  Continue Parkinson's medications, as per neurology. 4.  Hypertension.  BP has been on the low side, will discontinue lisinopril. 5.  Parkinson's disease, progressing.  Patient would likely need 24-hour supervision from now on and he should avoid driving.  This was discussed with the family in detail. 6.  Right hip pain, status post fall.  Will check x-ray to rule out fracture.   All the records are reviewed and case discussed with ED provider. Management plans discussed with the patient, family and they are in agreement.  CODE STATUS:    Code Status Orders  (From admission, onward)        Start     Ordered   10/12/17 2321  Full code  Continuous     10/12/17 2320    Code Status History    This patient has a current code status but no historical code status.       TOTAL TIME TAKING CARE OF THIS PATIENT: 45 minutes.    Amelia Jo M.D on 10/12/2017 at 11:41 PM  Between 7am to 6pm - Pager - [phone]  After 6pm go to [email or web address] - password [email or web address] [facility]  [facility]  Office  [phone]  CC: Primary care physician; [name]., MD

## 2017-10-12 NOTE — ED Notes (Signed)
Patient transported to CT 

## 2017-10-12 NOTE — ED Provider Notes (Signed)
[facility] Emergency Department Provider Note  Time seen: 7:38 PM  I have reviewed the triage vital signs and the nursing notes.   HISTORY  Chief Complaint Hypotension; Fall; and Weakness    HPI Roger Rodriguez is a 77 y.o. male with a past medical history of CKD, depression, hypertension, hyperlipidemia, Parkinson's, presents to the emergency department for generalized weakness, fall and hypotension.  According to report family last spoke to the patient around 83 PM today at which time he was acting normal.  They state at some point the patient had a fall, he appears more weak with somewhat slurred speech.  Patient is not sure if he took all of his medications or not today.  Family states the patient is responsible for taking his own medications.  They are not sure if he accidentally took too much medication or did not take his Parkinson's medication at all.  Patient states he fell, is not sure if he passed out, is not sure if he hit his head.  Patient denies any chest pain or abdominal pain, is awake alert, able to answer questions appropriate but is slow in his responses.  States he feels very weak.  Noted to be hypotensive by EMS 80s/60 currently 99/65 in the emergency department.   Past Medical History:  Diagnosis Date  . Allergy    allergic rhinitis  . Arthritis    OA  . Chronic kidney disease    stones  . Depression   . Family history of adverse reaction to anesthesia   . Hematuria   . History of kidney stones   . Hyperlipidemia   . Hypertension    no meds since parkinsons meds started  . Incontinence of urine   . Inguinal hernia   . Neuromuscular disorder ([place])    parkinsons  . Overactive bladder   . Parkinson's disease ([place])   . Prostate cancer ([place])   . Rhinitis, allergic   . Shortness of breath dyspnea    doe secondary to parkinsons    Patient Active Problem List   Diagnosis Date Noted  . RUQ pain 10/22/2016  . Depression, major, single  episode, moderate ([place]) 10/22/2016  . Renal cyst, right 09/28/2016  . Hepatic steatosis 09/28/2016  . Parkinson's disease ([place]) 09/18/2015  . Restless leg 09/18/2015  . Malignant neoplasm of prostate ([place]) 05/22/2015  . Hepatitis A 04/02/2015  . Allergic rhinitis 04/02/2015  . Arthritis 04/02/2015  . Narrowing of intervertebral disc space 04/02/2015  . 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 Accumulation of fluid in tissues 04/02/2015  . Essential (primary) hypertension 04/02/2015  . Borderline diabetes 04/02/2015  . Hyperlipidemia 04/02/2015  . BP (high blood pressure) 04/02/2015  . Infected sebaceous cyst 04/02/2015  . Hernia, inguinal 04/02/2015  . Leg pain 04/02/2015  . Lumbar canal stenosis 04/02/2015  . 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 Malignant melanoma ([place]) 04/02/2015  . Arthritis, degenerative 04/02/2015  . Fatigue 04/02/2015  . Idiopathic Parkinson's disease ([place]) 04/02/2015  . Plantar fasciitis 04/02/2015  . CA of prostate ([place]) 04/02/2015  . Benign essential tremor 04/02/2015  . Lower urinary tract infection 04/02/2015  . Degeneration of intervertebral disc of lumbosacral region 05/10/2012    Past Surgical History:  Procedure Laterality Date  . BACK SURGERY  2014  . CYSTOSCOPY WITH STENT PLACEMENT Right 01/13/2016   Procedure: CYSTOSCOPY WITH STENT PLACEMENT;  Surgeon: Hollice Espy, MD;  Location: [facility] ORS;  Service: Urology;  Laterality: Right;  . CYSTOSCOPY/URETEROSCOPY/HOLMIUM LASER/STENT  PLACEMENT Left 11/20/2015   Procedure: CYSTOSCOPY/URETEROSCOPY/HOLMIUM LASER/STENT PLACEMENT;  Surgeon: Hollice Espy, MD;  Location: [facility] ORS;  Service: Urology;  Laterality: Left;  . EYE SURGERY    . FRACTURE SURGERY      ankle   . H/O arthroscopy of right knee  2015  . H/O radical protatectomy    . HERNIA REPAIR    . JOINT REPLACEMENT     right knee  . LITHOTRIPSY  2010  . Rotator Cuff tear  1993   Repaired  . URETEROSCOPY WITH HOLMIUM LASER  LITHOTRIPSY  2002?  [name] URETEROSCOPY WITH HOLMIUM LASER LITHOTRIPSY Right 01/13/2016   Procedure: URETEROSCOPY WITH HOLMIUM LASER LITHOTRIPSY;  Surgeon: Hollice Espy, MD;  Location: [facility] ORS;  Service: Urology;  Laterality: Right;  [name] VASECTOMY     History    Prior to Admission medications   Medication Sig Start Date End Date Taking? Authorizing Provider  Amantadine HCl 100 MG tablet Take 1 tablet by mouth 2 (two) times daily. 04/21/17 05/21/17  [provider]  budesonide (RHINOCORT AQUA) 32 MCG/ACT nasal spray USE 1 PUFF IN EACH NOSTRIL EVERY DAY AS NEEDED 06/23/16   [name]., MD  carbidopa-levodopa (SINEMET CR) 50-200 MG per tablet TAKE 2 TABLET BY MOUTH 2 (four) TIMES DAILY. 03/21/15   [provider]  carbidopa-levodopa (SINEMET IR) 25-250 MG tablet Take 1 tablet by mouth 3 (three) times daily.    [provider]  celecoxib (CELEBREX) 200 MG capsule Take 1 capsule (200 mg total) by mouth 2 (two) times daily. 04/15/17   [name]., MD  Cholecalciferol (VITAMIN D) 2000 UNITS tablet Take 1,000 Units by mouth daily.  05/19/12   [provider]  erythromycin ophthalmic ointment Place 1 application into both eyes at bedtime.  12/31/15   [provider]  gabapentin (NEURONTIN) 400 MG capsule Take 400 mg by mouth 3 (three) times daily.  03/26/15   [provider]  ketoconazole (NIZORAL) 2 % shampoo WASH BEARD AND SCALP 3 DAYS A WEEK, LET SIT SEVERAL MINUTES AND RINSE OUT 06/18/17   [provider]  lisinopril (PRINIVIL,ZESTRIL) 2.5 MG tablet TAKE 1 TABLET (2.5 MG TOTAL) BY MOUTH 2 (TWO) TIMES DAILY. 04/09/17   [name]., MD  loratadine (CLARITIN) 10 MG tablet Take 10 mg by mouth daily. 05/19/12   [provider]  mirabegron ER (MYRBETRIQ) 25 MG TB24 tablet Take 1 tablet (25 mg total) 2 (two) times daily by mouth. 05/18/17   Hollice Espy, MD  pramipexole (MIRAPEX) 0.5 MG tablet Pramipexole 0.5 mg in  the morning, 0.5 mg in the afternoon and 0.5 mg at night. 11/30/16   [provider]  vitamin C (ASCORBIC ACID) 500 MG tablet Take 500 mg by mouth daily.    [provider]  VITAMIN E NATURAL PO Take 1 capsule by mouth daily.  05/19/12   [provider]    No Known Allergies  Family History  Problem Relation Age of Onset  . Dementia Mother   . Cerebral aneurysm Father   . Bladder Cancer Paternal Uncle   . Liver cancer Sister   . Prostate cancer Neg Hx   . Kidney cancer Neg Hx     Social History Social History   Tobacco Use  . Smoking status: Former Smoker    Types: Cigars    Last attempt to quit: 11/11/1968    Years since quitting: 48.9  . Smokeless tobacco: Former Systems developer    Types: Loss adjuster, chartered  Quit date: 11/12/2003  . Tobacco comment: Occassionally, also chewed tobacco till 2005. Quit smoking 1973  Substance Use Topics  . Alcohol use: No    Alcohol/week: 0.0 oz  . Drug use: No    Review of Systems Constitutional: Negative for fever.  Does not for generalized fatigue/weakness.  Eyes: Negative for visual complaints ENT: Negative for recent illness/congestion Cardiovascular: Negative for chest pain. Respiratory: Negative for shortness of breath. Gastrointestinal: Negative for abdominal pain, vomiting and diarrhea. Genitourinary: Negative for dysuria. Musculoskeletal: Negative for musculoskeletal complaints Skin: Negative for skin complaints  Neurological: Negative for headache denies any focal weakness or numbness. All other ROS negative  ____________________________________________   PHYSICAL EXAM:  VITAL SIGNS: ED Triage Vitals  Enc Vitals Group     BP 10/12/17 1931 99/65     Pulse Rate 10/12/17 1931 88     Resp 10/12/17 1931 16     Temp 10/12/17 1931 98.3 F (36.8 C)     Temp Source 10/12/17 1931 Oral     SpO2 10/12/17 1931 94 %     Weight 10/12/17 1932 285 lb (129.3 kg)     Height 10/12/17 1932 5\' 11"  (1.803 m)     Head Circumference --       Peak Flow --      Pain Score 10/12/17 1932 0     Pain Loc --      Pain Edu? --      Excl. in [place]? --    Constitutional: Alert and oriented.  Overall well-appearing, but his rather slow to respond. Eyes: Normal exam ENT   Head: Normocephalic and atraumatic.   Mouth/Throat: Mucous membranes are moist. Cardiovascular: Normal rate, regular rhythm. No murmur Respiratory: Normal respiratory effort without tachypnea nor retractions. Breath sounds are clear  Gastrointestinal: Soft and nontender. No distention.  Musculoskeletal: Able to move all extremities well. Neurologic: Somewhat slowed speech, normal language.  5/5 motor in upper extremities with no pronator drift.  4+/5 motor in bilateral lower extremities, equal.  No obvious cranial nerve deficits. Skin:  Skin is warm, dry and intact.  Psychiatric: Mood and affect are normal.  ____________________________________________    EKG  EKG reviewed and interpreted by myself shows normal sinus rhythm at 91 bpm with a narrow QRS, normal axis, normal intervals, nonspecific ST changes.  No ST elevation.  ____________________________________________    RADIOLOGY  X-ray negative CT head negative  ____________________________________________   INITIAL IMPRESSION / ASSESSMENT AND PLAN / ED COURSE  Pertinent labs & imaging results that were available during my care of the patient were reviewed by me and considered in my medical decision making (see chart for details).  Patient presents to the emergency department for generalized fatigue, weakness noted to be hypotensive by EMS, somewhat slurred speech in the emergency department.  Differential would include infectious etiology, CVA, medication reaction, accidental medication overdose, not taking Parkinson's medications.  We will check labs, IV hydrate, obtain CT imaging of the head, chest x-ray, urinalysis and continue to closely monitor.  Patient agreeable to this plan of  care.  Imaging is negative.  Labs are resulted showing acute renal insufficiency.  Patient's blood pressure has responded very nicely to IV fluids currently 657 systolic.  I discussed the patient with family who are now in the emergency department.  They state patient was found on the ground, but able to converse, when they sat him up he got lightheaded with slurred speech.  EMS state initial blood pressure 80/40, blood pressure currently 116  after fluids.  Highly suspect renal insufficiency to be the cause of the patient's somnolence and generalized weakness, possibly due to dehydration or medication reaction.  Less likely due to CVA given no focal deficits on exam, and improving blood pressure with fluids.  We will admit the patient to the hospitalist service for further treatment.  Family agreeable to this plan of care.  ____________________________________________   FINAL CLINICAL IMPRESSION(S) / ED DIAGNOSES  Generalized weakness Fall Renal insufficiency    Harvest Dark, MD 10/12/17 2042

## 2017-10-12 NOTE — ED Notes (Signed)
Kelley RN, aware of bed assigned  

## 2017-10-13 ENCOUNTER — Inpatient Hospital Stay: Payer: PPO

## 2017-10-13 ENCOUNTER — Ambulatory Visit: Payer: PPO

## 2017-10-13 ENCOUNTER — Other Ambulatory Visit: Payer: Self-pay

## 2017-10-13 ENCOUNTER — Other Ambulatory Visit: Payer: Self-pay | Admitting: Family Medicine

## 2017-10-13 DIAGNOSIS — M1990 Unspecified osteoarthritis, unspecified site: Secondary | ICD-10-CM

## 2017-10-13 LAB — BASIC METABOLIC PANEL
Anion gap: 6 (ref 5–15)
BUN: 30 mg/dL — ABNORMAL HIGH (ref 6–20)
CO2: 28 mmol/L (ref 22–32)
Calcium: 8.3 mg/dL — ABNORMAL LOW (ref 8.9–10.3)
Chloride: 102 mmol/L (ref 101–111)
Creatinine, Ser: 2.06 mg/dL — ABNORMAL HIGH (ref 0.61–1.24)
GFR calc Af Amer: 34 mL/min — ABNORMAL LOW (ref >60)
GFR calc non Af Amer: 29 mL/min — ABNORMAL LOW (ref >60)
Glucose, Bld: 150 mg/dL — ABNORMAL HIGH (ref 65–99)
Potassium: 4.2 mmol/L (ref 3.5–5.1)
Sodium: 136 mmol/L (ref 135–145)

## 2017-10-13 LAB — URINALYSIS, COMPLETE (UACMP) WITH MICROSCOPIC
Bacteria, UA: NONE SEEN
Bilirubin Urine: NEGATIVE
Glucose, UA: 50 mg/dL — AB
Ketones, ur: NEGATIVE mg/dL
Leukocytes, UA: NEGATIVE
Nitrite: NEGATIVE
Protein, ur: 30 mg/dL — AB
Specific Gravity, Urine: 1.01 (ref 1.005–1.030)
Squamous Epithelial / LPF: NONE SEEN
pH: 5 (ref 5.0–8.0)

## 2017-10-13 LAB — CBC
HCT: 37.1 % — ABNORMAL LOW (ref 40.0–52.0)
Hemoglobin: 12.5 g/dL — ABNORMAL LOW (ref 13.0–18.0)
MCH: 32.4 pg (ref 26.0–34.0)
MCHC: 33.6 g/dL (ref 32.0–36.0)
MCV: 96.5 fL (ref 80.0–100.0)
Platelets: 138 10*3/uL — ABNORMAL LOW (ref 150–440)
RBC: 3.84 MIL/uL — ABNORMAL LOW (ref 4.40–5.90)
RDW: 14.2 % (ref 11.5–14.5)
WBC: 6.1 10*3/uL (ref 3.8–10.6)

## 2017-10-13 MED ORDER — HYDROCODONE-ACETAMINOPHEN 5-325 MG PO TABS: 1.0000 | ORAL_TABLET | Freq: Four times a day (QID) | ORAL | 0 refills | Status: DC | PRN

## 2017-10-13 NOTE — NC FL2 (Signed)
[place] LEVEL OF CARE SCREENING TOOL     IDENTIFICATION  Patient Name: Roger Rodriguez Birthdate: [birth year] Sex: male Admission Date (Current Location): 10/12/2017  [place] and [place] Number:  Engineering geologist and Address:  [facility], [address], [place], [place] [address]      Provider Number: [id number]  Attending Physician Name and Address:  Bettey Costa, MD  Relative Name and Phone Number:  [place], [place] [phone] or [place], [place] [phone]     Current Level of Care: Hospital Recommended Level of Care: [place] Prior Approval Number:    Date Approved/Denied:   PASRR Number: Pending  Discharge Plan: SNF    Current Diagnoses: Patient Active Problem List   Diagnosis Date Noted  . ARF (acute renal failure) ([place]) 10/12/2017  . RUQ pain 10/22/2016  . Depression, major, single episode, moderate ([place]) 10/22/2016  . Renal cyst, right 09/28/2016  . Hepatic steatosis 09/28/2016  . Parkinson's disease ([place]) 09/18/2015  . Restless leg 09/18/2015  . Malignant neoplasm of prostate ([place]) 05/22/2015  . Hepatitis A 04/02/2015  . Allergic rhinitis 04/02/2015  . Arthritis 04/02/2015  . Narrowing of intervertebral disc space 04/02/2015  . 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 Accumulation of fluid in tissues 04/02/2015  . Essential (primary) hypertension 04/02/2015  . Borderline diabetes 04/02/2015  . Hyperlipidemia 04/02/2015  . BP (high blood pressure) 04/02/2015  . Infected sebaceous cyst 04/02/2015  . Hernia, inguinal 04/02/2015  . Leg pain 04/02/2015  . Lumbar canal stenosis 04/02/2015  . 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 Malignant melanoma ([place]) 04/02/2015  . Arthritis, degenerative 04/02/2015  . Fatigue 04/02/2015  . Idiopathic Parkinson's disease ([place]) 04/02/2015  . Plantar fasciitis 04/02/2015  . CA of prostate ([place]) 04/02/2015  . Benign essential tremor 04/02/2015   . Lower urinary tract infection 04/02/2015  . Degeneration of intervertebral disc of lumbosacral region 05/10/2012    Orientation RESPIRATION BLADDER Height & Weight     Self, Time, Situation, Place  Normal Incontinent Weight: 255 lb (115.7 kg) Height:  5\' 11"  (180.3 cm)  BEHAVIORAL SYMPTOMS/MOOD NEUROLOGICAL BOWEL NUTRITION STATUS      Continent Diet  AMBULATORY STATUS COMMUNICATION OF NEEDS Skin   Limited Assist Verbally Normal                       Personal Care Assistance Level of Assistance  Bathing, Feeding, Dressing Bathing Assistance: Limited assistance Feeding assistance: Independent Dressing Assistance: Limited assistance     Functional Limitations Info  Sight, Hearing, Speech Sight Info: Adequate Hearing Info: Adequate Speech Info: Adequate    SPECIAL CARE FACTORS FREQUENCY  PT (By licensed PT)     PT Frequency: minimum 5x a week              Contractures Contractures Info: Not present    Additional Factors Info  Code Status, Allergies Code Status Info: Full Code Allergies Info: NKA           Current Medications (10/13/2017):  This is the current hospital active medication list Current Facility-Administered Medications  Medication Dose Route Frequency Provider Last Rate Last Dose  . 0.9 %  sodium chloride infusion   Intravenous Continuous Amelia Jo, MD 75 mL/hr at 10/13/17 0005    . acetaminophen (TYLENOL) tablet 650 mg  650 mg Oral Q6H PRN Amelia Jo, MD       Or  . acetaminophen (TYLENOL) suppository 650 mg  650 mg  Rectal Q6H PRN Amelia Jo, MD      . amantadine (SYMMETREL) 50 MG/5ML solution 100 mg  100 mg Oral BID Amelia Jo, MD      . atropine 1 % ophthalmic solution 1 drop  1 drop Both Eyes Once Amelia Jo, MD      . bisacodyl (DULCOLAX) EC tablet 5 mg  5 mg Oral Daily PRN Amelia Jo, MD      . carbidopa-levodopa (SINEMET CR) 50-200 MG per tablet controlled release 2 tablet  2 tablet Oral QID Amelia Jo, MD   2  tablet at 10/13/17 1340  . cholecalciferol (VITAMIN D) tablet 1,000 Units  1,000 Units Oral Daily Amelia Jo, MD   1,000 Units at 10/13/17 [phone]  . docusate sodium (COLACE) capsule 100 mg  100 mg Oral BID Amelia Jo, MD   100 mg at 10/13/17 0856  . fluticasone (FLONASE) 50 MCG/ACT nasal spray 1 spray  1 spray Each Nare Daily Amelia Jo, MD   1 spray at 10/13/17 0856  . gabapentin (NEURONTIN) capsule 400 mg  400 mg Oral TID Amelia Jo, MD   400 mg at 10/13/17 1544  . heparin injection 5,000 Units  5,000 Units Subcutaneous Q8H Amelia Jo, MD   5,000 Units at 10/13/17 1340  . HYDROcodone-acetaminophen (NORCO/VICODIN) 5-325 MG per tablet 1-2 tablet  1-2 tablet Oral Q4H PRN Amelia Jo, MD   1 tablet at 10/13/17 1340  . loratadine (CLARITIN) tablet 10 mg  10 mg Oral Daily Amelia Jo, MD   10 mg at 10/13/17 0856  . ondansetron (ZOFRAN) tablet 4 mg  4 mg Oral Q6H PRN Amelia Jo, MD       Or  . ondansetron [facility]) injection 4 mg  4 mg Intravenous Q6H PRN Amelia Jo, MD      . traZODone (DESYREL) tablet 25 mg  25 mg Oral QHS PRN Amelia Jo, MD      . vitamin C (ASCORBIC ACID) tablet 500 mg  500 mg Oral Daily Amelia Jo, MD   500 mg at 10/13/17 1638     Discharge Medications: Please see discharge summary for a list of discharge medications.  Relevant Imaging Results:  Relevant Lab Results:   Additional Information SSN [id number]  Ross Ludwig, [place]

## 2017-10-13 NOTE — Discharge Summary (Addendum)
[facility] [place] NAME: Roger Rodriguez    MR#:  [id number]  DATE OF BIRTH:  09-02-1940  DATE OF ADMISSION:  10/12/2017 ADMITTING PHYSICIAN: Amelia Jo, MD  DATE OF DISCHARGE: 10/17/2017  PRIMARY CARE PHYSICIAN: [name]., MD    ADMISSION DIAGNOSIS:  Dehydration [E86.0] Weakness [R53.1] AKI (acute kidney injury) ([place]) [N17.9]  DISCHARGE DIAGNOSIS:  Active Problems: AKI   SECONDARY DIAGNOSIS:   Past Medical History:  Diagnosis Date  . Allergy    allergic rhinitis  . Arthritis    OA  . Chronic kidney disease    stones  . Depression   . Family history of adverse reaction to anesthesia   . Hematuria   . History of kidney stones   . Hyperlipidemia   . Hypertension    no meds since parkinsons meds started  . Incontinence of urine   . Inguinal hernia   . Neuromuscular disorder ([place])    parkinsons  . Overactive bladder   . Parkinson's disease ([place])   . Prostate cancer ([place])   . Rhinitis, allergic   . Shortness of breath dyspnea    doe secondary to [place]:  77 year old male with history of Parkinson's disease who presents after a mechanical fall.  1.  Acute kidney injury in the setting of dehydration and lisinopril: Patient's creatinine has improved and is near baseline. Patient will need to follow-up with his PCP for repeat BMP at the end of the week.  2.  Right hip pain after mechanical fall without evidence of acute fracture Knee x-ray was also negative for acute fracture.  Physical therapy is recommending skilled nursing facility upon discharge   3.  Parkinson disease: I spoke with patient's neurologist.  She wanted to make some changes to his medications as prescribed below. Patient states he is not taking amantadine.   4.  Acute encephalopathy, metabolic due to acute kidney injury: CT the head was negative for acute CVA.  His mental status is at baseline. MRI brain was  negative as well.  5.  Essential hypertension: His blood pressure has remained low/normal.  He does not need blood pressure medications at this time at discharge.  Blood pressure should be monitored as an outpatient.   6.  Right knee sprain with possible avulsion chip: Patient was evaluated with oblique surgery.  Patient will continue with Ace wrap and knee immobilizer.  Weightbearing as tolerated on leg with walker.  Patient will follow up with Dr. Sabra Heck in 1 week.  7.  Fever: Patient spiked a fever a few days ago.  He was pancultured.  Blood cultures are positive for Proteus.  He was on Zosyn.  He will be discharged on oral Augmentin.  Chest x-ray and urine analysis was negative for acute infection.     DISCHARGE CONDITIONS AND DIET:   Stable for discharge on heart healthy diet  CONSULTS OBTAINED:    DRUG ALLERGIES:  No Known Allergies  DISCHARGE MEDICATIONS:   Allergies as of 10/17/2017   No Known Allergies     Medication List    STOP taking these medications   Amantadine HCl 100 MG tablet   lisinopril 2.5 MG tablet Commonly known as:  PRINIVIL,ZESTRIL     TAKE these medications   amoxicillin-clavulanate 875-125 MG tablet Commonly known as:  AUGMENTIN Take 1 tablet by mouth 2 (two) times daily.   atropine 1 % ophthalmic solution Place 1 drop into both eyes  once. For one dose   budesonide 32 MCG/ACT nasal spray Commonly known as:  RHINOCORT AQUA USE 1 PUFF IN EACH NOSTRIL EVERY DAY AS NEEDED   carbidopa-levodopa 25-250 MG tablet Commonly known as:  SINEMET IR Take 1 tablet by mouth 3 (three) times daily. What changed:  Another medication with the same name was changed. Make sure you understand how and when to take each.   carbidopa-levodopa 50-200 MG tablet Commonly known as:  SINEMET CR Take 2 tablets by mouth 4 (four) times daily. What changed:  See the new instructions.   cholecalciferol 1000 units tablet Commonly known as:  VITAMIN D Take 1,000 Units by  mouth daily.   erythromycin ophthalmic ointment Place 1 application into both eyes at bedtime.   gabapentin 400 MG capsule Commonly known as:  NEURONTIN Take 400 mg by mouth 3 (three) times daily.   HYDROcodone-acetaminophen 5-325 MG tablet Commonly known as:  NORCO/VICODIN Take 1 tablet by mouth every 6 (six) hours as needed for moderate pain.   ketoconazole 2 % shampoo Commonly known as:  NIZORAL WASH BEARD AND SCALP 3 DAYS A WEEK, LET SIT SEVERAL MINUTES AND RINSE OUT   loratadine 10 MG tablet Commonly known as:  CLARITIN Take 10 mg by mouth daily.   pramipexole 0.5 MG tablet Commonly known as:  MIRAPEX Take 2 tablets (1 mg total) by mouth at bedtime. What changed:    how much to take  when to take this   vitamin C 500 MG tablet Commonly known as:  ASCORBIC ACID Take 500 mg by mouth daily.   VITAMIN E NATURAL PO Take 1 capsule by mouth daily.            Durable Medical Equipment  (From admission, onward)        Start     Ordered   10/13/17 1130  For home use only DME Gilford Rile  [facility])  Once    Question:  Patient needs a walker to treat with the following condition  Answer:  Parkinson disease ([place])   10/13/17 1129        Today   CHIEF COMPLAINT:  No acute issues overnight   VITAL SIGNS:  Blood pressure (!) 141/82, pulse 75, temperature (!) 97.5 F (36.4 C), temperature source Oral, resp. rate 18, height 5\' 11"  (1.803 m), weight 113.4 kg (250 lb), SpO2 98 %.   REVIEW OF SYSTEMS:  Review of Systems  Constitutional: Negative.  Negative for chills, fever and malaise/fatigue.  HENT: Negative.  Negative for ear discharge, ear pain, hearing loss, nosebleeds and sore throat.   Eyes: Negative.  Negative for blurred vision and pain.  Respiratory: Negative.  Negative for cough, hemoptysis, shortness of breath and wheezing.   Cardiovascular: Negative.  Negative for chest pain, palpitations and leg swelling.  Gastrointestinal: Negative.  Negative for  abdominal pain, blood in stool, diarrhea, nausea and vomiting.  Genitourinary: Negative.  Negative for dysuria.  Musculoskeletal: Positive for falls. Negative for back pain and joint pain.  Skin: Negative.   Neurological: Negative for dizziness, tremors, speech change, focal weakness, seizures and headaches.  Endo/Heme/Allergies: Negative.  Does not bruise/bleed easily.  Psychiatric/Behavioral: Negative.  Negative for depression, hallucinations and suicidal ideas.     PHYSICAL EXAMINATION:  GENERAL:  77 y.o.-year-old patient lying in the bed with no acute distress.  NECK:  Supple, no jugular venous distention. No thyroid enlargement, no tenderness.  LUNGS: Normal breath sounds bilaterally, no wheezing, rales,rhonchi  No use of accessory muscles of respiration.  CARDIOVASCULAR: S1, S2 normal. No murmurs, rubs, or gallops.  ABDOMEN: Soft, non-tender, non-distended. Bowel sounds present. No organomegaly or mass.  EXTREMITIES: No pedal edema, cyanosis, or clubbing.  PSYCHIATRIC: The patient is alert and oriented x 3.  SKIN: No obvious rash, lesion, or ulcer.   DATA REVIEW:   CBC Recent Labs  Lab 10/15/17 0427  WBC 7.3  HGB 11.2*  HCT 33.0*  PLT 140*    Chemistries  Recent Labs  Lab 10/14/17 0953  NA 136  K 5.0  CL 104  CO2 23  GLUCOSE 197*  BUN 38*  CREATININE 1.75*  CALCIUM 8.6*    Cardiac Enzymes Recent Labs  Lab 10/12/17 1936  TROPONINI <0.03    Microbiology Results  @MICRORSLT48 @  RADIOLOGY:  No results found.    Allergies as of 10/17/2017   No Known Allergies     Medication List    STOP taking these medications   Amantadine HCl 100 MG tablet   lisinopril 2.5 MG tablet Commonly known as:  PRINIVIL,ZESTRIL     TAKE these medications   amoxicillin-clavulanate 875-125 MG tablet Commonly known as:  AUGMENTIN Take 1 tablet by mouth 2 (two) times daily.   atropine 1 % ophthalmic solution Place 1 drop into both eyes once. For one dose    budesonide 32 MCG/ACT nasal spray Commonly known as:  RHINOCORT AQUA USE 1 PUFF IN EACH NOSTRIL EVERY DAY AS NEEDED   carbidopa-levodopa 25-250 MG tablet Commonly known as:  SINEMET IR Take 1 tablet by mouth 3 (three) times daily. What changed:  Another medication with the same name was changed. Make sure you understand how and when to take each.   carbidopa-levodopa 50-200 MG tablet Commonly known as:  SINEMET CR Take 2 tablets by mouth 4 (four) times daily. What changed:  See the new instructions.   cholecalciferol 1000 units tablet Commonly known as:  VITAMIN D Take 1,000 Units by mouth daily.   erythromycin ophthalmic ointment Place 1 application into both eyes at bedtime.   gabapentin 400 MG capsule Commonly known as:  NEURONTIN Take 400 mg by mouth 3 (three) times daily.   HYDROcodone-acetaminophen 5-325 MG tablet Commonly known as:  NORCO/VICODIN Take 1 tablet by mouth every 6 (six) hours as needed for moderate pain.   ketoconazole 2 % shampoo Commonly known as:  NIZORAL WASH BEARD AND SCALP 3 DAYS A WEEK, LET SIT SEVERAL MINUTES AND RINSE OUT   loratadine 10 MG tablet Commonly known as:  CLARITIN Take 10 mg by mouth daily.   pramipexole 0.5 MG tablet Commonly known as:  MIRAPEX Take 2 tablets (1 mg total) by mouth at bedtime. What changed:    how much to take  when to take this   vitamin C 500 MG tablet Commonly known as:  ASCORBIC ACID Take 500 mg by mouth daily.   VITAMIN E NATURAL PO Take 1 capsule by mouth daily.            Durable Medical Equipment  (From admission, onward)        Start     Ordered   10/13/17 1130  For home use only DME Gilford Rile  [facility])  Once    Question:  Patient needs a walker to treat with the following condition  Answer:  Parkinson disease ([place])   10/13/17 1129          Management plans discussed with the patient and son and they are in agreement. Stable for discharge   Patient should  follow up with  pcp  CODE STATUS:     Code Status Orders  (From admission, onward)        Start     Ordered   10/12/17 2321  Full code  Continuous     10/12/17 2320    Code Status History    This patient has a current code status but no historical code status.    Advance Directive Documentation     Most Recent Value  Type of Advance Directive  Healthcare Power of Attorney, Living will  Pre-existing out of facility DNR order (yellow form or pink MOST form)  -  "MOST" Form in Place?  -      TOTAL TIME TAKING CARE OF THIS PATIENT: 38 minutes.    Note: This dictation was prepared with Dragon dictation along with smaller phrase technology. Any transcriptional errors that result from this process are unintentional.  Sahan Pen M.D on 10/17/2017 at 10:04 AM  Between 7am to 6pm - Pager - [phone] After 6pm go to [email or web address] - password [email or web address] [facility]  Office  [phone]  CC: Primary care physician; [name]., MD

## 2017-10-13 NOTE — Plan of Care (Signed)
  Problem: Education: Goal: Knowledge of General Education information will improve Outcome: Progressing   Problem: Activity: Goal: Risk for activity intolerance will decrease Outcome: Progressing   Problem: Safety: Goal: Ability to remain free from injury will improve Outcome: Progressing   

## 2017-10-13 NOTE — Progress Notes (Signed)
[facility] [place] NAME: Roger Rodriguez    MR#:  [id number]  DATE OF BIRTH:  1940/07/25  SUBJECTIVE:  doing ok this am  REVIEW OF SYSTEMS:    Review of Systems  Constitutional: Negative for fever, chills weight loss HENT: Negative for ear pain, nosebleeds, congestion, facial swelling, rhinorrhea, neck pain, neck stiffness and ear discharge.   Respiratory: Negative for cough, shortness of breath, wheezing  Cardiovascular: Negative for chest pain, palpitations and leg swelling.  Gastrointestinal: Negative for heartburn, abdominal pain, vomiting, diarrhea or consitpation Genitourinary: Negative for dysuria, urgency, frequency, hematuria Musculoskeletal: Negative for back pain or joint pain Neurological: Negative for dizziness, seizures, syncope, focal weakness,  numbness and headaches.  Shuffling gait, parkinson's Hematological: Does not bruise/bleed easily.  Psychiatric/Behavioral: Negative for hallucinations, confusion, dysphoric mood    Tolerating Diet: yes      DRUG ALLERGIES:  No Known Allergies  VITALS:  Blood pressure (!) 155/79, pulse (!) 102, temperature 99.2 F (37.3 C), temperature source Oral, resp. rate 20, height 5\' 11"  (1.803 m), weight 115.7 kg (255 lb), SpO2 98 %.  PHYSICAL EXAMINATION:  Constitutional: Appears well-developed and well-nourished. No distress. HENT: Normocephalic. [name] Oropharynx is clear and moist.  Eyes: Conjunctivae and EOM are normal. PERRLA, no scleral icterus.  Neck: Normal ROM. Neck supple. No JVD. No tracheal deviation. CVS: RRR, S1/S2 +, no murmurs, no gallops, no carotid bruit.  Pulmonary: Effort and breath sounds normal, no stridor, rhonchi, wheezes, rales.  Abdominal: Soft. BS +,  no distension, tenderness, rebound or guarding.  Musculoskeletal: Normal range of motion. No edema and no tenderness.  Neuro: Alert. CN 2-12 grossly intact. No focal deficits. Skin: Skin is warm and dry. No rash  noted. Psychiatric: Normal mood and affect.      LABORATORY PANEL:   CBC Recent Labs  Lab 10/13/17 0506  WBC 6.1  HGB 12.5*  HCT 37.1*  PLT 138*   ------------------------------------------------------------------------------------------------------------------  Chemistries  Recent Labs  Lab 10/13/17 0506  NA 136  K 4.2  CL 102  CO2 28  GLUCOSE 150*  BUN 30*  CREATININE 2.06*  CALCIUM 8.3*   ------------------------------------------------------------------------------------------------------------------  Cardiac Enzymes Recent Labs  Lab 10/12/17 1936  TROPONINI <0.03   ------------------------------------------------------------------------------------------------------------------  RADIOLOGY:  Dg Chest 2 View  Result Date: 10/12/2017 CLINICAL DATA:  Altered mental status with fall EXAM: CHEST - 2 VIEW COMPARISON:  07/01/2017 FINDINGS: Degenerative changes of the spine. Patchy atelectasis at the bases. No pleural effusion. Stable cardiomediastinal silhouette. No pneumothorax. IMPRESSION: No active cardiopulmonary disease. Low lung volumes with patchy atelectasis at the bases. Electronically Signed   By: Donavan Foil M.D.   On: 10/12/2017 19:56   Ct Head Wo Contrast  Result Date: 10/12/2017 CLINICAL DATA:  Weakness and recent fall EXAM: CT HEAD WITHOUT CONTRAST TECHNIQUE: Contiguous axial images were obtained from the base of the skull through the vertex without intravenous contrast. COMPARISON:  04/12/2013 FINDINGS: Brain: No evidence of acute infarction, hemorrhage, hydrocephalus, extra-axial collection or mass lesion/mass effect. Cavum septum pellucidum is noted and stable. Vascular: No hyperdense vessel or unexpected calcification. Skull: Normal. Negative for fracture or focal lesion. Sinuses/Orbits: No acute finding. Other: None. IMPRESSION: No acute abnormality noted. Electronically Signed   By: Inez Catalina M.D.   On: 10/12/2017 20:10   Mr Brain Wo  Contrast  Result Date: 10/13/2017 CLINICAL DATA:  Altered level of consciousness. EXAM: MRI HEAD WITHOUT CONTRAST TECHNIQUE: Multiplanar, multiecho pulse sequences of the brain and surrounding  structures were obtained without intravenous contrast. COMPARISON:  CT 10/12/2017, MRI 04/12/2013 FINDINGS: Brain: Generalized atrophy with progression since 2014. Negative for hydrocephalus. Negative for acute infarct. Mild chronic white matter changes similar to 2014. Brainstem and basal ganglia intact. Negative for hemorrhage mass or edema Vascular: Normal arterial flow void Skull and upper cervical spine: Negative Sinuses/Orbits: Retention cyst and mucosal edema right maxillary sinus. Bilateral cataract removal Other: None IMPRESSION: No acute abnormality. Generalized atrophy and mild chronic microvascular ischemia in the white matter. Electronically Signed   By: Franchot Gallo M.D.   On: 10/13/2017 12:35   Dg Hip Unilat With Pelvis 2-3 Views Right  Result Date: 10/13/2017 CLINICAL DATA:  Right hip pain after a fall yesterday. EXAM: DG HIP (WITH OR WITHOUT PELVIS) 2-3V RIGHT COMPARISON:  None. FINDINGS: Mild degenerative changes are present bilaterally. No acute or healing fracture is present. The pelvis is unremarkable. Advanced degenerative changes are present in the lower lumbar spine. Multilevel laminectomy is noted. IMPRESSION: 1. No acute abnormality. 2. Degenerative changes are present in the lumbar spine bilaterally. Electronically Signed   By: San Morelle M.D.   On: 10/13/2017 09:43     ASSESSMENT AND PLAN:   77 year old male with history of Parkinson's disease who presents after a mechanical fall.  1.  Acute kidney injury in the setting of dehydration and lisinopril: Creatinine improved with IV fluids however not quite at baseline. Discontinue lisinopril for now Patient will need to follow-up with his PCP for repeat BMP at the end of the week.  2.  Right hip pain after mechanical fall  without evidence of acute fracture  3.  Parkinson disease: Patient will continue outpatient medications with outpatient follow-up with his neurologist. 4.  Acute encephalopathy, metabolic due to acute kidney injury: CT the head was negative for acute CVA.  His mental status is at baseline.  5.  Essential hypertension: Due to low blood pressure and acute kidney injury lisinopril has been discontinued.      Management plans discussed with the patient and son and they are is in agreement.  CODE STATUS: full  TOTAL TIME TAKING CARE OF THIS PATIENT: 30 minutes.     POSSIBLE D/C today pending PT, Draven Laine M.D on 10/13/2017 at 6:54 PM  Between 7am to 6pm - Pager - [phone] After 6pm go to [email or web address] - password [email or web address] [facility]  Office  [phone]  CC: Primary care physician; [name]., MD  Note: This dictation was prepared with Dragon dictation along with smaller phrase technology. Any transcriptional errors that result from this process are unintentional.

## 2017-10-13 NOTE — Evaluation (Signed)
Physical Therapy Evaluation Patient Details Name: Roger Rodriguez MRN: [id number] DOB: 1940/09/30 Today's Date: 10/13/2017   History of Present Illness  Ptis a76 y.o.malewith a known history of Parkinson's disease,hypertension, overactive bladder and prostate cancer in the past,status post surgical treatment.  Patient was brought to emergency room for acute onset of confusion, lethargy,generalized weakness and fall,at home.Per family, patient is acting more confused and he seems to be very weak and with slurred speech in the past 24 hrs. Per neurology notes and EMRit seems the patient has had issues with low blood pressure, poor balance,falls,intermittent confusion and noncompliance with his medication for the past 6 months or so.Blood test done in the emergency room are remarkable for elevated creatinine level at 2.29.Hemoglobin is 12.6.Glucose level is 182.Brain CT scan and chest x-ray,reviewed by myself, are unremarkable.Patient is admitted for further evaluation and treatment.  Assessment includes: acute kidney injury, R hip pain with mechanical fall, Parkinson's disease, and essential HTN.       Clinical Impression  Pt presents with deficits in strength, transfers, mobility, gait, balance, and activity tolerance and with significant R knee pain.  Pt required significant time and effort with bed mobility tasks and mod A to stand from an elevated surface.  Upon standing pt required frequent mod A to prevent L lateral/posterior LOB.  Pt able to take several small steps with a step-to pattern and antalgic on the right again with occasional mod A to prevent LOB.  Upon assessment pt's R knee found to be very warm compared to L knee along with pt reporting pain as 8-10/10 during the session, nursing notified.  Overall pt is at a very high risk for falls and appears to have declined significantly functionally compared to his stated baseline.  Pt will benefit from PT services in a SNF  setting upon discharge to safely address above deficits for decreased caregiver assistance and eventual return to PLOF.      Follow Up Recommendations SNF    Equipment Recommendations  None recommended by PT    Recommendations for Other Services       Precautions / Restrictions Precautions Precautions: Fall Restrictions Weight Bearing Restrictions: No      Mobility  Bed Mobility Overal bed mobility: Needs Assistance Bed Mobility: Supine to Sit;Sit to Supine     Supine to sit: Supervision Sit to supine: Supervision   General bed mobility comments: Pt very slow with bed mobility tasks and required verbal cues for sequencing but able to complete without physical assistance  Transfers Overall transfer level: Needs assistance   Transfers: Sit to/from Stand Sit to Stand: Mod assist         General transfer comment: Mod verbal and tactile cues for sequencing with mod A to prevent L lateral/posterior LOB  Ambulation/Gait Ambulation/Gait assistance: Mod assist Ambulation Distance (Feet): 3 Feet Assistive device: Rolling walker (2 wheeled) Gait Pattern/deviations: Step-to pattern;Trunk flexed;Antalgic;Decreased stance time - right   Gait velocity interpretation: <1.8 ft/sec, indicative of risk for recurrent falls General Gait Details: Mod A to prevent L lateral/posterior LOB and extensive cues for sequencing, limited by R knee pain  Stairs            Wheelchair Mobility    Modified Rankin (Stroke Patients Only)       Balance Overall balance assessment: Needs assistance Sitting-balance support: Feet unsupported;Bilateral upper extremity supported Sitting balance-Leahy Scale: Good     Standing balance support: Bilateral upper extremity supported Standing balance-Leahy Scale: Poor Standing balance comment: Occasional mod A  required to prevent LOB                             Pertinent Vitals/Pain Pain Assessment: 0-10 Pain Score: 10-Worst  pain ever Pain Location: R Knee Pain Descriptors / Indicators: Aching;Sore Pain Intervention(s): Premedicated before session;Monitored during session;Limited activity within patient's tolerance    Home Living Family/patient expects to be discharged to:: Private residence Living Arrangements: Alone Available Help at Discharge: Family;Friend(s);Available PRN/intermittently Type of Home: House Home Access: Ramped entrance     Home Layout: One level Home Equipment: Walker - 2 wheels;Cane - single point      Prior Function Level of Independence: Independent with assistive device(s)         Comments: Mod Ind with amb with a SPC community distances, drives, Ind with all ADLs, 2-3 falls in the last 6 months secondary to "just lose my balance".     Hand Dominance   Dominant Hand: Right    Extremity/Trunk Assessment   Upper Extremity Assessment Upper Extremity Assessment: Overall WFL for tasks assessed    Lower Extremity Assessment Lower Extremity Assessment: Generalized weakness       Communication   Communication: No difficulties  Cognition Arousal/Alertness: Awake/alert Behavior During Therapy: WFL for tasks assessed/performed Overall Cognitive Status: Within Functional Limits for tasks assessed                                        General Comments      Exercises Other Exercises Other Exercises: Bed mobility training with verbal cues for sequencing Other Exercises: Sit to/from stand transfer training with mod verbal cues for proper sequencing   Assessment/Plan    PT Assessment Patient needs continued PT services  PT Problem List Decreased strength;Decreased activity tolerance;Decreased balance;Decreased mobility;Decreased knowledge of use of DME       PT Treatment Interventions DME instruction;Gait training;Functional mobility training;Balance training;Therapeutic activities;Therapeutic exercise;Patient/family education    PT Goals (Current  goals can be found in the Care Plan section)  Acute Rehab PT Goals Patient Stated Goal: Better balance PT Goal Formulation: With patient Time For Goal Achievement: 10/26/17 Potential to Achieve Goals: Fair    Frequency Min 2X/week   Barriers to discharge Inaccessible home environment;Decreased caregiver support      Co-evaluation               AM-PAC PT "6 Clicks" Daily Activity  Outcome Measure Difficulty turning over in bed (including adjusting bedclothes, sheets and blankets)?: A Lot Difficulty moving from lying on back to sitting on the side of the bed? : A Lot Difficulty sitting down on and standing up from a chair with arms (e.g., wheelchair, bedside commode, etc,.)?: Unable Help needed moving to and from a bed to chair (including a wheelchair)?: A Lot Help needed walking in hospital room?: Total Help needed climbing 3-5 steps with a railing? : Total 6 Click Score: 9    End of Session Equipment Utilized During Treatment: Gait belt Activity Tolerance: Patient limited by pain Patient left: in bed;with call bell/phone within reach;with bed alarm set;with family/visitor present Nurse Communication: Mobility status;Other (comment)(Pt's R knee warm with pain 8-10/10) PT Visit Diagnosis: Unsteadiness on feet (R26.81);History of falling (Z91.81);Muscle weakness (generalized) (M62.81);Difficulty in walking, not elsewhere classified (R26.2)    Time: 1434-1510 PT Time Calculation (min) (ACUTE ONLY): 36 min   Charges:  PT Evaluation $PT Eval Low Complexity: 1 Low PT Treatments $Therapeutic Activity: 8-22 mins   PT G Codes:        DRoyetta Asal PT, DPT 10/13/17, 3:34 PM

## 2017-10-13 NOTE — Progress Notes (Signed)
Family Meeting Note  Advance Directive:yes  Today a meeting took place with the Patient.and son The following clinical team members were present during this meeting:MD  The following were discussed:Patient's diagnosis: Acute kidney injury due to dehydration Parkinson's disease , Patient's progosis: > 12 months and Goals for treatment: Full Code  Additional follow-up to be provided: Patient remains full code.  He has advanced directives.  He does not need to create a new advanced directive  Time spent during discussion: 16 minutes  Roger Dimmick, MD

## 2017-10-14 ENCOUNTER — Telehealth: Payer: Self-pay

## 2017-10-14 ENCOUNTER — Inpatient Hospital Stay: Payer: PPO

## 2017-10-14 ENCOUNTER — Ambulatory Visit: Payer: Self-pay | Admitting: Family Medicine

## 2017-10-14 LAB — BASIC METABOLIC PANEL
Anion gap: 9 (ref 5–15)
BUN: 38 mg/dL — ABNORMAL HIGH (ref 6–20)
CO2: 23 mmol/L (ref 22–32)
Calcium: 8.6 mg/dL — ABNORMAL LOW (ref 8.9–10.3)
Chloride: 104 mmol/L (ref 101–111)
Creatinine, Ser: 1.75 mg/dL — ABNORMAL HIGH (ref 0.61–1.24)
GFR calc Af Amer: 42 mL/min — ABNORMAL LOW (ref >60)
GFR calc non Af Amer: 36 mL/min — ABNORMAL LOW (ref >60)
Glucose, Bld: 197 mg/dL — ABNORMAL HIGH (ref 65–99)
Potassium: 5 mmol/L (ref 3.5–5.1)
Sodium: 136 mmol/L (ref 135–145)

## 2017-10-14 LAB — LACTIC ACID, PLASMA
Lactic Acid, Venous: 1.1 mmol/L (ref 0.5–1.9)
Lactic Acid, Venous: 1.9 mmol/L (ref 0.5–1.9)

## 2017-10-14 LAB — BRAIN NATRIURETIC PEPTIDE: B Natriuretic Peptide: 61 pg/mL (ref 0.0–100.0)

## 2017-10-14 MED ORDER — PRAMIPEXOLE DIHYDROCHLORIDE 1 MG PO TABS
0.5000 mg | ORAL_TABLET | Freq: Three times a day (TID) | ORAL | Status: DC
  Administered 2017-10-14 – 2017-10-17 (×10): 0.5 mg via ORAL
  Filled 2017-10-14 (×3): qty 2
  Filled 2017-10-14: qty 1
  Filled 2017-10-14 (×2): qty 2
  Filled 2017-10-14 (×2): qty 1
  Filled 2017-10-14: qty 2
  Filled 2017-10-14: qty 1
  Filled 2017-10-14: qty 2

## 2017-10-14 MED ORDER — AMLODIPINE BESYLATE 10 MG PO TABS
10.0000 mg | ORAL_TABLET | Freq: Every day | ORAL | Status: DC
  Administered 2017-10-14: 10 mg via ORAL
  Filled 2017-10-14: qty 1

## 2017-10-14 MED ORDER — PIPERACILLIN-TAZOBACTAM 3.375 G IVPB
3.3750 g | Freq: Three times a day (TID) | INTRAVENOUS | Status: DC
  Administered 2017-10-14 – 2017-10-17 (×9): 3.375 g via INTRAVENOUS
  Filled 2017-10-14 (×10): qty 50

## 2017-10-14 MED ORDER — AMLODIPINE BESYLATE 10 MG PO TABS: 10.0000 mg | ORAL_TABLET | Freq: Every day | ORAL | 11 refills | Status: DC

## 2017-10-14 NOTE — Consult Note (Signed)
ORTHOPAEDIC CONSULTATION  REQUESTING PHYSICIAN: Bettey Costa, MD  Chief Complaint: Right knee pain  HPI: Roger Rodriguez is a 77 y.o. male who complains of  Right knee pain after a fall two days ago.  Had TKA several years ago and had been doing well other than Parkinson's. Admitted for sepsis yesterday as well.  X-rays show probable avulsion from medial femoral condyle. Total knee looks fine.  Has swelling in legs.   Past Medical History:  Diagnosis Date  . Allergy    allergic rhinitis  . Arthritis    OA  . Chronic kidney disease    stones  . Depression   . Family history of adverse reaction to anesthesia   . Hematuria   . History of kidney stones   . Hyperlipidemia   . Hypertension    no meds since parkinsons meds started  . Incontinence of urine   . Inguinal hernia   . Neuromuscular disorder ([place])    parkinsons  . Overactive bladder   . Parkinson's disease ([place])   . Prostate cancer ([place])   . Rhinitis, allergic   . Shortness of breath dyspnea    doe secondary to parkinsons   Past Surgical History:  Procedure Laterality Date  . BACK SURGERY  2014  . CYSTOSCOPY WITH STENT PLACEMENT Right 01/13/2016   Procedure: CYSTOSCOPY WITH STENT PLACEMENT;  Surgeon: Hollice Espy, MD;  Location: [facility] ORS;  Service: Urology;  Laterality: Right;  . CYSTOSCOPY/URETEROSCOPY/HOLMIUM LASER/STENT PLACEMENT Left 11/20/2015   Procedure: CYSTOSCOPY/URETEROSCOPY/HOLMIUM LASER/STENT PLACEMENT;  Surgeon: Hollice Espy, MD;  Location: [facility] OR[facility];  Service: Urology;  Laterality: Left;  . EYE SURGERY    . FRACTURE SURGERY      ankle   . H/O arthroscopy of right knee  2015  . H/O radical protatectomy    . HERNIA REPAIR    . JOINT REPLACEMENT     right knee  . LITHOTRIPSY  2010  . Rotator Cuff tear  1993   Repaired  . URETEROSCOPY WITH HOLMIUM LASER LITHOTRIPSY  2002?  [name] URETEROSCOPY WITH HOLMIUM LASER LITHOTRIPSY Right 01/13/2016   Procedure: URETEROSCOPY WITH HOLMIUM LASER LITHOTRIPSY;  Surgeon:  Hollice Espy, MD;  Location: [facility] ORS;  Service: Urology;  Laterality: Right;  [name] VASECTOMY     History   Social History   Socioeconomic History  . Marital status: Married    Spouse name: Not on file  . Number of children: Not on file  . Years of education: Not on file  . Highest education level: Not on file  Occupational History  . Not on file  Social Needs  . Financial resource strain: Not on file  . Food insecurity:    Worry: Not on file    Inability: Not on file  . Transportation needs:    Medical: Not on file    Non-medical: Not on file  Tobacco Use  . Smoking status: Former Smoker    Types: Cigars    Last attempt to quit: 11/11/1968    Years since quitting: 48.9  . Smokeless tobacco: Former Systems developer    Types: Chew    Quit date: 11/12/2003  . Tobacco comment: Occassionally, also chewed tobacco till 2005. Quit smoking 1973  Substance and Sexual Activity  . Alcohol use: No    Alcohol/week: 0.0 oz  . Drug use: No  . Sexual activity: Never  Lifestyle  . Physical activity:    Days per week: Not on file    Minutes per session: Not on file  .  Stress: Not on file  Relationships  . Social connections:    Talks on phone: Not on file    Gets together: Not on file    Attends religious service: Not on file    Active member of club or organization: Not on file    Attends meetings of clubs or organizations: Not on file    Relationship status: Not on file  Other Topics Concern  . Not on file  Social History Narrative  . Not on file   Family History  Problem Relation Age of Onset  . Dementia Mother   . Cerebral aneurysm Father   . Bladder Cancer Paternal Uncle   . Liver cancer Sister   . Prostate cancer Neg Hx   . Kidney cancer Neg Hx    No Known Allergies Prior to Admission medications   Medication Sig Start Date End Date Taking? Authorizing Provider  atropine 1 % ophthalmic solution Place 1 drop into both eyes once. For one dose   Yes [provider]   budesonide (RHINOCORT AQUA) 32 MCG/ACT nasal spray USE 1 PUFF IN EACH NOSTRIL EVERY DAY AS NEEDED Patient taking differently: USE 1 PUFF IN EACH NOSTRIL EVERY DAY as needed 06/23/16  Yes [name]., MD  carbidopa-levodopa (SINEMET CR) 50-200 MG per tablet TAKE 2 TABLET BY MOUTH 4 (four) TIMES DAILY. 03/21/15  Yes [provider]  carbidopa-levodopa (SINEMET IR) 25-250 MG tablet Take 1 tablet by mouth 3 (three) times daily.   Yes [provider]  cholecalciferol (VITAMIN D) 1000 units tablet Take 1,000 Units by mouth daily.  05/19/12  Yes [provider]  gabapentin (NEURONTIN) 400 MG capsule Take 400 mg by mouth 3 (three) times daily.  03/26/15  Yes [provider]  ketoconazole (NIZORAL) 2 % shampoo WASH BEARD AND SCALP 3 DAYS A WEEK, LET SIT SEVERAL MINUTES AND RINSE OUT 06/18/17  Yes [provider]  lisinopril (PRINIVIL,ZESTRIL) 2.5 MG tablet TAKE 1 TABLET (2.5 MG TOTAL) BY MOUTH 2 (TWO) TIMES DAILY. Patient taking differently: Take 2.5 mg by mouth daily.  04/09/17  Yes [name]., MD  loratadine (CLARITIN) 10 MG tablet Take 10 mg by mouth daily. 05/19/12  Yes [provider]  pramipexole (MIRAPEX) 0.5 MG tablet Take one tablet by mouth twice daily 11/30/16  Yes [provider]  vitamin C (ASCORBIC ACID) 500 MG tablet Take 500 mg by mouth daily.   Yes [provider]  VITAMIN E NATURAL PO Take 1 capsule by mouth daily.  05/19/12  Yes [provider]  Amantadine HCl 100 MG tablet Take 1 tablet by mouth 2 (two) times daily. 04/21/17 05/21/17  [provider]  amLODipine (NORVASC) 10 MG tablet Take 1 tablet (10 mg total) by mouth daily. 10/14/17 10/14/18  Bettey Costa, MD  erythromycin ophthalmic ointment Place 1 application into both eyes at bedtime.  12/31/15   [provider]  HYDROcodone-acetaminophen (NORCO/VICODIN) 5-325 MG tablet Take 1 tablet by mouth every 6 (six) hours as needed for  moderate pain. 10/13/17   Bettey Costa, MD   Dg Chest 1 View  Result Date: 10/14/2017 CLINICAL DATA:  Fever.  Recent fall EXAM: CHEST  1 VIEW COMPARISON:  October 12, 2017 FINDINGS: No edema or consolidation. Heart is upper normal in size with pulmonary vascularity within normal limits. No adenopathy. No pneumothorax. There is degenerative change in each shoulder. There is postoperative change in the right shoulder region. There are scattered foci of carotid artery  calcification. IMPRESSION: No edema or consolidation. Heart upper normal in size. Foci of carotid artery calcification noted. There is arthropathy in both shoulders with superior migration of each humeral head, a finding indicative of chronic rotator cuff tears. Electronically Signed   By: Lowella Grip III M.D.   On: 10/14/2017 13:37   Dg Chest 2 View  Result Date: 10/12/2017 CLINICAL DATA:  Altered mental status with fall EXAM: CHEST - 2 VIEW COMPARISON:  07/01/2017 FINDINGS: Degenerative changes of the spine. Patchy atelectasis at the bases. No pleural effusion. Stable cardiomediastinal silhouette. No pneumothorax. IMPRESSION: No active cardiopulmonary disease. Low lung volumes with patchy atelectasis at the bases. Electronically Signed   By: Donavan Foil M.D.   On: 10/12/2017 19:56   Dg Knee 1-2 Views Right  Result Date: 10/14/2017 CLINICAL DATA:  Recent fall.  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 Negative for fracture. Negative for prosthetic loosening Soft tissue densities lateral to the medial femoral condyle and lateral to the joint have density of cement and were present previously. IMPRESSION: Negative for fracture Foreign bodies in the soft tissues laterally presumed cement. Electronically Signed   By: Franchot Gallo M.D.   On: 10/14/2017 09:27   Ct Head Wo Contrast  Result Date: 10/12/2017 CLINICAL DATA:  Weakness and recent fall EXAM: CT HEAD  WITHOUT CONTRAST TECHNIQUE: Contiguous axial images were obtained from the base of the skull through the vertex without intravenous contrast. COMPARISON:  04/12/2013 FINDINGS: Brain: No evidence of acute infarction, hemorrhage, hydrocephalus, extra-axial collection or mass lesion/mass effect. Cavum septum pellucidum is noted and stable. Vascular: No hyperdense vessel or unexpected calcification. Skull: Normal. Negative for fracture or focal lesion. Sinuses/Orbits: No acute finding. Other: None. IMPRESSION: No acute abnormality noted. Electronically Signed   By: Inez Catalina M.D.   On: 10/12/2017 20:10   Mr Brain Wo Contrast  Result Date: 10/13/2017 CLINICAL DATA:  Altered level of consciousness. EXAM: MRI HEAD WITHOUT CONTRAST TECHNIQUE: Multiplanar, multiecho pulse sequences of the brain and surrounding structures were obtained without intravenous contrast. COMPARISON:  CT 10/12/2017, MRI 04/12/2013 FINDINGS: Brain: Generalized atrophy with progression since 2014. Negative for hydrocephalus. Negative for acute infarct. Mild chronic white matter changes similar to 2014. Brainstem and basal ganglia intact. Negative for hemorrhage mass or edema Vascular: Normal arterial flow void Skull and upper cervical spine: Negative Sinuses/Orbits: Retention cyst and mucosal edema right maxillary sinus. Bilateral cataract removal Other: None IMPRESSION: No acute abnormality. Generalized atrophy and mild chronic microvascular ischemia in the white matter. Electronically Signed   By: Franchot Gallo M.D.   On: 10/13/2017 12:35   Dg Hip Unilat With Pelvis 2-3 Views Right  Result Date: 10/13/2017 CLINICAL DATA:  Right hip pain after a fall yesterday. EXAM: DG HIP (WITH OR WITHOUT PELVIS) 2-3V RIGHT COMPARISON:  None. FINDINGS: Mild degenerative changes are present bilaterally. No acute or healing fracture is present. The pelvis is unremarkable. Advanced degenerative changes are present in the lower lumbar spine. Multilevel  laminectomy is noted. IMPRESSION: 1. No acute abnormality. 2. Degenerative changes are present in the lumbar spine bilaterally. Electronically Signed   By: [place] Morelle M.D.   On: 10/13/2017 09:43    Positive ROS: All other systems have been reviewed and were otherwise negative with the exception of those mentioned in the HPI and as above.  Physical Exam: General: Alert, no acute distress Cardiovascular: No pedal edema Respiratory: No cyanosis, no use of accessory musculature  GI: No organomegaly, abdomen is soft and non-tender Skin: No lesions in the area of chief complaint Neurologic: Sensation intact distally Psychiatric: Patient is competent for consent with normal mood and affect Lymphatic: No axillary or cervical lymphadenopathy  MUSCULOSKELETAL: Right knee slight swelling and bruising. Wound benign.  ROM restricted. Edema lower legs.  No reddness.    Assessment: Sprain right knee with possible avulsion chip from [place]  Plan: Below knee TEDs Ace wrap Knee Immobilizer Ice WBAT on leg with walker  RTC 1 week    Park Breed, MD [phone]   10/14/2017 5:17 PM

## 2017-10-14 NOTE — Clinical Social Work Note (Signed)
Patient's [facility] number is [phone].  CSW to continue bed search in [facility].  Jones Broom. [place], MSW, [place]  10/14/2017 10:20 AM

## 2017-10-14 NOTE — Telephone Encounter (Signed)
LM for pt to CB and reschedule cancelled AWV from yesterday (10/13/17). Pt needs this rescheduled before CPE on 10/26/17. -MM

## 2017-10-14 NOTE — Clinical Social Work Note (Addendum)
CSW presented bed offers to patient's son [name] [phone] who was at bedside, patient's son did not like any of the offers, he would like to be contacted tomorrow to see if there are any other bed offers.  CSW contacted insurance company which began [employer], clinical.  Patient's son prefers either [employer], [employer] or [employer]  CSW to continue to follow patient's progress throughout discharge planning.  Jones Broom. [place], MSW, [place]  10/14/2017 3:40 PM

## 2017-10-14 NOTE — Progress Notes (Signed)
Pharmacy Antibiotic Note  Roger Rodriguez is a 77 y.o. male admitted on 10/12/2017 with Fever.  Pharmacy has been consulted for Zosyn dosing.  Patient had discharge orders signed, then spiked a fever. Will treat with empiric antibiotic until cultures come back.   Plan: Will start Zosyn 3.375g IV q8h (CrCl 44.13ml/min)    Height: 5\' 11"  (180.3 cm) Weight: 239 lb (108.4 kg) IBW/kg (Calculated) : 75.3  Temp (24hrs), Avg:101.1 F (38.4 C), Min:98.6 F (37 C), Max:102.8 F (39.3 C)  Recent Labs  Lab 10/12/17 1936 10/13/17 0506 10/14/17 0953  WBC 8.4 6.1  --   CREATININE 2.29* 2.06* 1.75*    Estimated Creatinine Clearance: 44.3 mL/min (A) (by C-G formula based on SCr of 1.75 mg/dL (H)).    No Known Allergies  Antimicrobials this admission: 4/4 Zosyn >>   Dose adjustments this admission:   Microbiology results: 4/4 BCx: sent  Thank you for allowing pharmacy to be a part of this patient's care.  Candelaria Stagers, PharmD Pharmacy Resident  10/14/2017 4:13 PM

## 2017-10-14 NOTE — Progress Notes (Signed)
[facility] [place] NAME: Roger Rodriguez    MR#:  [id number]  DATE OF BIRTH:  [birth year]  SUBJECTIVE:  Patient had fever  REVIEW OF SYSTEMS:    Review of Systems  Constitutional:  ++ fever, nochills weight loss HENT: Negative for ear pain, nosebleeds, congestion, facial swelling, rhinorrhea, neck pain, neck stiffness and ear discharge.   Respiratory: Negative for cough, shortness of breath, wheezing  Cardiovascular: Negative for chest pain, palpitations and leg swelling.  Gastrointestinal: Negative for heartburn, abdominal pain, vomiting, diarrhea or consitpation Genitourinary: Negative for dysuria, urgency, frequency, hematuria Musculoskeletal: Negative for back pain or joint pain Neurological: Negative for dizziness, seizures, syncope, focal weakness,  numbness and headaches.  Shuffling gait, parkinson's Hematological: Does not bruise/bleed easily.  Psychiatric/Behavioral: Negative for hallucinations, confusion, dysphoric mood    Tolerating Diet: yes      DRUG ALLERGIES:  No Known Allergies  VITALS:  Blood pressure (!) 169/80, pulse 88, temperature (!) 102.1 F (38.9 C), temperature source Oral, resp. rate 20, height 5\' 11"  (1.803 m), weight 108.4 kg (239 lb), SpO2 96 %.  PHYSICAL EXAMINATION:  Constitutional: Appears well-developed and well-nourished. No distress. HENT: Normocephalic. [name] Oropharynx is clear and moist.  Eyes: Conjunctivae and EOM are normal. PERRLA, no scleral icterus.  Neck: Normal ROM. Neck supple. No JVD. No tracheal deviation. CVS: RRR, S1/S2 +, no murmurs, no gallops, no carotid bruit.  Pulmonary: Effort and breath sounds normal, no stridor, rhonchi, wheezes, rales.  Abdominal: Soft. BS +,  no distension, tenderness, rebound or guarding.  Musculoskeletal: Normal range of motion. No edema and no tenderness.  Neuro: Alert. CN 2-12 grossly intact. No focal deficits. Skin: Skin is warm and dry. No rash  noted. Psychiatric: Normal mood and affect.      LABORATORY PANEL:   CBC Recent Labs  Lab 10/13/17 0506  WBC 6.1  HGB 12.5*  HCT 37.1*  PLT 138*   ------------------------------------------------------------------------------------------------------------------  Chemistries  Recent Labs  Lab 10/14/17 0953  NA 136  K 5.0  CL 104  CO2 23  GLUCOSE 197*  BUN 38*  CREATININE 1.75*  CALCIUM 8.6*   ------------------------------------------------------------------------------------------------------------------  Cardiac Enzymes Recent Labs  Lab 10/12/17 1936  TROPONINI <0.03   ------------------------------------------------------------------------------------------------------------------  RADIOLOGY:  Dg Chest 2 View  Result Date: 10/12/2017 CLINICAL DATA:  Altered mental status with fall EXAM: CHEST - 2 VIEW COMPARISON:  07/01/2017 FINDINGS: Degenerative changes of the spine. Patchy atelectasis at the bases. No pleural effusion. Stable cardiomediastinal silhouette. No pneumothorax. IMPRESSION: No active cardiopulmonary disease. Low lung volumes with patchy atelectasis at the bases. Electronically Signed   By: Donavan Foil M.D.   On: 10/12/2017 19:56   Dg Knee 1-2 Views Right  Result Date: 10/14/2017 CLINICAL DATA:  Recent fall.  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 Negative for fracture. Negative for prosthetic loosening Soft tissue densities lateral to the medial femoral condyle and lateral to the joint have density of cement and were present previously. IMPRESSION: Negative for fracture Foreign bodies in the soft tissues laterally presumed cement. Electronically Signed   By: Franchot Gallo M.D.   On: 10/14/2017 09:27   Ct Head Wo Contrast  Result Date: 10/12/2017 CLINICAL DATA:  Weakness and recent fall EXAM: CT HEAD WITHOUT CONTRAST TECHNIQUE: Contiguous axial images were obtained  from the base of the skull through the vertex without intravenous contrast. COMPARISON:  04/12/2013 FINDINGS: Brain: No evidence  of acute infarction, hemorrhage, hydrocephalus, extra-axial collection or mass lesion/mass effect. Cavum septum pellucidum is noted and stable. Vascular: No hyperdense vessel or unexpected calcification. Skull: Normal. Negative for fracture or focal lesion. Sinuses/Orbits: No acute finding. Other: None. IMPRESSION: No acute abnormality noted. Electronically Signed   By: Inez Catalina M.D.   On: 10/12/2017 20:10   Mr Brain Wo Contrast  Result Date: 10/13/2017 CLINICAL DATA:  Altered level of consciousness. EXAM: MRI HEAD WITHOUT CONTRAST TECHNIQUE: Multiplanar, multiecho pulse sequences of the brain and surrounding structures were obtained without intravenous contrast. COMPARISON:  CT 10/12/2017, MRI 04/12/2013 FINDINGS: Brain: Generalized atrophy with progression since 2014. Negative for hydrocephalus. Negative for acute infarct. Mild chronic white matter changes similar to 2014. Brainstem and basal ganglia intact. Negative for hemorrhage mass or edema Vascular: Normal arterial flow void Skull and upper cervical spine: Negative Sinuses/Orbits: Retention cyst and mucosal edema right maxillary sinus. Bilateral cataract removal Other: None IMPRESSION: No acute abnormality. Generalized atrophy and mild chronic microvascular ischemia in the white matter. Electronically Signed   By: Franchot Gallo M.D.   On: 10/13/2017 12:35   Dg Hip Unilat With Pelvis 2-3 Views Right  Result Date: 10/13/2017 CLINICAL DATA:  Right hip pain after a fall yesterday. EXAM: DG HIP (WITH OR WITHOUT PELVIS) 2-3V RIGHT COMPARISON:  None. FINDINGS: Mild degenerative changes are present bilaterally. No acute or healing fracture is present. The pelvis is unremarkable. Advanced degenerative changes are present in the lower lumbar spine. Multilevel laminectomy is noted. IMPRESSION: 1. No acute abnormality. 2.  Degenerative changes are present in the lumbar spine bilaterally. Electronically Signed   By: San Morelle M.D.   On: 10/13/2017 09:43     ASSESSMENT AND PLAN:   77 year old male with history of Parkinson's disease who presents after a mechanical fall.  1.  Acute kidney injury in the setting of dehydration and lisinopril: Patient's creatinine has improved and is near baseline. Patient will need to follow-up with his PCP for repeat BMP at the end of the week.  2.  Right hip pain after mechanical fall without evidence of acute fracture Knee x-ray was also negative for acute fracture.  Physical therapy is recommending skilled nursing facility upon discharge   3.  Parkinson disease: Patient will continue outpatient medications with outpatient follow-up with his neurologist. 4.  Acute encephalopathy, metabolic due to acute kidney injury: CT the head was negative for acute CVA.  His mental status is at baseline. MRI brain was negative as well. 5.  Essential hypertension: Due to underlying kidney issues blood pressure medications has been changed from lisinopril to Norvasc     6. Fever: Check CXR Add ISS    Management plans discussed with the patient and son and they are is in agreement.  CODE STATUS: full  TOTAL TIME TAKING CARE OF THIS PATIENT: 24 minutes.     POSSIBLE D/C tomorrow to SNF, Roger Rodriguez M.D on 10/14/2017 at 11:36 AM  Between 7am to 6pm - Pager - [phone] After 6pm go to [email or web address] - password [email or web address] [facility]  Office  [phone]  CC: Primary care physician; [name]., MD  Note: This dictation was prepared with Dragon dictation along with smaller phrase technology. Any transcriptional errors that result from this process are unintentional.

## 2017-10-14 NOTE — Telephone Encounter (Signed)
FYI- Patient is being discharged from [facility] today 10/14/17. Dx: Acute renal failure. Hospital follow up 10/26/17 at 9:00. Patient was scheduled for a CPE. This is the only time due to patient's schedule and Dr. Alben Spittle schedule within the hospital follow up time.

## 2017-10-14 NOTE — Progress Notes (Signed)
Patient with temp of 102.2, Dr. Benjie Karvonen made aware.  Patient given norco for pain in his knee so will not give tylenol as norco already given and contains tylenol.  Clarise Cruz, RN

## 2017-10-15 ENCOUNTER — Inpatient Hospital Stay: Payer: PPO

## 2017-10-15 ENCOUNTER — Telehealth: Payer: Self-pay | Admitting: Radiology

## 2017-10-15 LAB — BLOOD CULTURE ID PANEL (REFLEXED)

## 2017-10-15 LAB — CBC
HCT: 33 % — ABNORMAL LOW (ref 40.0–52.0)
Hemoglobin: 11.2 g/dL — ABNORMAL LOW (ref 13.0–18.0)
MCH: 32.3 pg (ref 26.0–34.0)
MCHC: 34 g/dL (ref 32.0–36.0)
MCV: 95 fL (ref 80.0–100.0)
Platelets: 140 10*3/uL — ABNORMAL LOW (ref 150–440)
RBC: 3.47 MIL/uL — ABNORMAL LOW (ref 4.40–5.90)
RDW: 14 % (ref 11.5–14.5)
WBC: 7.3 10*3/uL (ref 3.8–10.6)

## 2017-10-15 LAB — URINALYSIS, ROUTINE W REFLEX MICROSCOPIC
Bilirubin Urine: NEGATIVE
Glucose, UA: 150 mg/dL — AB
Hgb urine dipstick: NEGATIVE
Ketones, ur: 5 mg/dL — AB
Leukocytes, UA: NEGATIVE
Nitrite: NEGATIVE
Protein, ur: 30 mg/dL — AB
Specific Gravity, Urine: 1.017 (ref 1.005–1.030)
Squamous Epithelial / LPF: NONE SEEN
pH: 5 (ref 5.0–8.0)

## 2017-10-15 LAB — LACTIC ACID, PLASMA
Lactic Acid, Venous: 0.9 mmol/L (ref 0.5–1.9)
Lactic Acid, Venous: 0.7 mmol/L (ref 0.5–1.9)

## 2017-10-15 LAB — GLUCOSE, CAPILLARY: Glucose-Capillary: 163 mg/dL — ABNORMAL HIGH (ref 65–99)

## 2017-10-15 MED ORDER — AMOXICILLIN-POT CLAVULANATE 875-125 MG PO TABS: 1.0000 | ORAL_TABLET | Freq: Two times a day (BID) | ORAL | 0 refills | Status: DC

## 2017-10-15 MED ORDER — SODIUM CHLORIDE 0.9 % IV BOLUS
500.0000 mL | Freq: Once | INTRAVENOUS | Status: AC
  Administered 2017-10-15: 500 mL via INTRAVENOUS

## 2017-10-15 MED ORDER — SODIUM CHLORIDE 0.9 % IV SOLN
INTRAVENOUS | Status: DC
  Administered 2017-10-15 – 2017-10-16 (×5): via INTRAVENOUS

## 2017-10-15 NOTE — Clinical Social Work Note (Signed)
CSW received insurance authorization for patient to go to [facility], authorization number is [id number] it is good for 5 days.  CSW received a phone call from physician who said patient is not ready for discharge today.  CSW updated SNF and insurance company.  Weekend Education officer, museum will follow up on patient.  Jones Broom. [place], MSW, [place]  10/15/2017 2:02 PM

## 2017-10-15 NOTE — Progress Notes (Signed)
Pharmacy Antibiotic Note  Roger Rodriguez is a 77 y.o. male admitted on 10/12/2017 now with Fever on 4/4.  Pharmacy has been consulted for Zosyn dosing.  Patient had discharge orders signed, then spiked a fever. Will treat with empiric antibiotic until cultures come back.   Plan: Will continue Zosyn 3.375g IV q8h EI  Height: 5\' 11"  (180.3 cm) Weight: 252 lb (114.3 kg) IBW/kg (Calculated) : 75.3  Temp (24hrs), Avg:100 F (37.8 C), Min:96.5 F (35.8 C), Max:102.8 F (39.3 C)  Recent Labs  Lab 10/12/17 1936 10/13/17 0506 10/14/17 0953 10/14/17 1620 10/14/17 1854 10/15/17 0427 10/15/17 0602  WBC 8.4 6.1  --   --   --  7.3  --   CREATININE 2.29* 2.06* 1.75*  --   --   --   --   LATICACIDVEN  --   --   --  1.1 1.9  --  0.9    Estimated Creatinine Clearance: 45.5 mL/min (A) (by C-G formula based on SCr of 1.75 mg/dL (H)).    No Known Allergies  Antimicrobials this admission: 4/4 Zosyn >>   Dose adjustments this admission:   Microbiology results: 4/4 BCx x2: NGTD  Thank you for allowing pharmacy to be a part of this patient's care.  Rayna Sexton, PharmD, BCPS Clinical Pharmacist 10/15/2017 8:35 AM

## 2017-10-15 NOTE — Clinical Social Work Note (Signed)
Clinical Social Work Assessment  Patient Details  Name: Roger Rodriguez MRN: [id number] Date of Birth: [birth year]  Date of referral:  10/14/17               Reason for consult:  Facility Placement                Permission sought to share information with:  Family Supports, Customer service manager Permission granted to share information::  Yes, Verbal Permission Granted  Name::     Hameed, [place]olar [phone] or Caine, [place] [phone]   Agency::  SNF admissions  Relationship::     Contact Information:     Housing/Transportation Living arrangements for the past 2 months:  Single Family Home Source of Information:  Medical Team, Adult Children Patient Interpreter Needed:  None Criminal Activity/Legal Involvement Pertinent to Current Situation/Hospitalization:  No - Comment as needed Significant Relationships:  Adult Children Lives with:  Self Do you feel safe going back to the place where you live?  No Need for family participation in patient care:  Yes (Comment)  Care giving concerns:  Patient and family feel he needs short term rehab, before he is able to return back home.     Social Worker assessment / plan:  Patient is a 77 year old male who is alert and oriented x4.  Patient's son was at bedside, and patient has been to rehab in the past, they are familiar with the process.  CSW explained how insurance will pay for SNF stay and what the process is for getting authorization.  CSW informed patient that pending insurance authorization will determine if patient is able to go to SNF or not.  CSW was given permission to begin bed search in [place].   Employment status:  Retired Nurse, adult PT Recommendations:  [place] / Referral to community resources:     Patient/Family's Response to care:  Patient and family are in agreement to having patient go to SNF.  Patient/Family's Understanding of and  Emotional Response to Diagnosis, Current Treatment, and Prognosis:  Patient and family are aware of current prognosis and treatment plan.  Emotional Assessment Appearance:  Appears stated age Attitude/Demeanor/Rapport:    Affect (typically observed):  Appropriate, Calm, Pleasant Orientation:  Oriented to Self, Oriented to Place, Oriented to  Time, Oriented to Situation Alcohol / Substance use:  Not Applicable Psych involvement (Current and /or in the community):  No (Comment)  Discharge Needs  Concerns to be addressed:  Lack of Support Readmission within the last 30 days:  No Current discharge risk:  Lack of support system Barriers to Discharge:  Insurance Authorization   Anell Barr 10/15/2017, 12:16 PM

## 2017-10-15 NOTE — Evaluation (Signed)
Physical Therapy Re-Evaluation Patient Details Name: Roger Rodriguez MRN: [id number] DOB: October 07, 1940 Today's Date: 10/15/2017   History of Present Illness  Pt is a 77 y.o. male with a known history of Parkinson's disease, hypertension, overactive bladder and prostate cancer in the past, status post surgical treatment.  Patient was brought to emergency room for acute onset of confusion, lethargy, generalized weakness and fall, at home.  Per family, patient is acting more confused and he seems to be very weak and with slurred speech in the past 24 hrs. Per neurology notes and EMR it seems the patient has had issues with low blood pressure, poor balance, falls, intermittent confusion and noncompliance with his medication for the past 6 months or so. Blood test done in the emergency room are remarkable for elevated creatinine level at 2.29.  Hemoglobin is 12.6.  Glucose level is 182.  Brain CT scan and chest x-ray, were unremarkable. Patient is admitted for further evaluation and treatment.   Assessment includes: Sprain right knee with possible avulsion chip from medial femoral condyle, acute kidney injury, R hip pain with mechanical fall, Parkinson's disease, and essential HTN.       Clinical Impression  Pt with new Dx of: Sprain right knee with possible avulsion chip from medial femoral condyle with new orders for eval and treat.  Of note, ortho MD paged to confirm if pt may ambulate with PT with RLE KI removed but page had not been answered at time of note entry.  During session pt's RLE KI was kept in place.  Pt presents with deficits in strength, transfers, mobility, gait, balance, and activity tolerance with notable increase in the amount of physical assistance pt required during bed mobility and transfers per below.  Once pt was in standing with the RW he was unable to advance his RLE or to lift his R foot from the floor.  Pt will benefit from PT services in a SNF setting upon discharge to safely address  above deficits for decreased caregiver assistance and eventual return to PLOF.      Follow Up Recommendations SNF    Equipment Recommendations  None recommended by PT    Recommendations for Other Services       Precautions / Restrictions Precautions Precautions: Fall Required Braces or Orthoses: Knee Immobilizer - Right Knee Immobilizer - Right: On at all times;Other (comment)(Need to confirm if KI can be removed to ambulate with PT) Restrictions Weight Bearing Restrictions: Yes RLE Weight Bearing: Weight bearing as tolerated Other Position/Activity Restrictions: Need to confirm if KI can be removed to ambulate with PT      Mobility  Bed Mobility Overal bed mobility: Needs Assistance Bed Mobility: Supine to Sit;Sit to Supine     Supine to sit: Mod assist Sit to supine: Mod assist   General bed mobility comments: Mod A for BLEs in and out of bed and for upper body to full upright sitting during sup to sit  Transfers Overall transfer level: Needs assistance Equipment used: Rolling walker (2 wheeled) Transfers: Sit to/from Stand Sit to Stand: +2 physical assistance;Max assist;From elevated surface         General transfer comment: Extensive assist and verbal cues for sequencing with sit to/from stand transfers  Ambulation/Gait             General Gait Details: Pt required physical assistance to bring RLE posteriorly as he stood from a seated position and from there pt was unable to advance the RLE or lift it  from the floor  Stairs            Wheelchair Mobility    Modified Rankin (Stroke Patients Only)       Balance Overall balance assessment: Needs assistance Sitting-balance support: Feet unsupported;Bilateral upper extremity supported Sitting balance-Leahy Scale: Fair Sitting balance - Comments: Initial R lateral lean but pt able to correct with verbal cues for sequencing   Standing balance support: Bilateral upper extremity  supported Standing balance-Leahy Scale: Poor Standing balance comment: Heavy reliance on BUE support and +2 assist to remain in standing                             Pertinent Vitals/Pain Pain Score: 2  Pain Location: R Knee Pain Descriptors / Indicators: Aching;Sore Pain Intervention(s): Monitored during session;Premedicated before session    [place] expects to be discharged to:: Private residence Living Arrangements: Alone Available Help at Discharge: Family;Friend(s);Available PRN/intermittently Type of Home: House Home Access: Ramped entrance     Home Layout: One level Home Equipment: Walker - 2 wheels;Cane - single point      Prior Function Level of Independence: Independent with assistive device(s)         Comments: Mod Ind with amb with a SPC community distances, drives, Ind with all ADLs, 2-3 falls in the last 6 months secondary to "just lose my balance".     Hand Dominance   Dominant Hand: Right    Extremity/Trunk Assessment   Upper Extremity Assessment Upper Extremity Assessment: Overall WFL for tasks assessed    Lower Extremity Assessment Lower Extremity Assessment: RLE deficits/detail RLE Deficits / Details: R hip flex strength <3/5 RLE: Unable to fully assess due to immobilization       Communication   Communication: No difficulties  Cognition Arousal/Alertness: Awake/alert Behavior During Therapy: WFL for tasks assessed/performed Overall Cognitive Status: Within Functional Limits for tasks assessed                                        General Comments      Exercises Total Joint Exercises Ankle Circles/Pumps: Strengthening;Both;5 reps;10 reps Quad Sets: Strengthening;Both;10 reps Gluteal Sets: Strengthening;Both;10 reps Heel Slides: AAROM;Left;5 reps Hip ABduction/ADduction: AAROM;Both;10 reps Straight Leg Raises: AAROM;Both;10 reps Knee Flexion: AROM;Left;5 reps;10 reps Other  Exercises Other Exercises: Bed mobility training with verbal cues for sequencing Other Exercises: Sit to/from stand transfer training with max verbal cues and demonstration for proper sequencing Other Exercises: Weight shifting activities in sitting at EOB   Assessment/Plan    PT Assessment Patient needs continued PT services  PT Problem List Decreased strength;Decreased activity tolerance;Decreased balance;Decreased mobility;Decreased knowledge of use of DME       PT Treatment Interventions DME instruction;Gait training;Functional mobility training;Balance training;Therapeutic activities;Therapeutic exercise;Patient/family education    PT Goals (Current goals can be found in the Care Plan section)  Acute Rehab PT Goals Patient Stated Goal: Better balance PT Goal Formulation: With patient Time For Goal Achievement: 10/28/17 Potential to Achieve Goals: Fair    Frequency 7X/week   Barriers to discharge Inaccessible home environment;Decreased caregiver support      Co-evaluation               AM-PAC PT "6 Clicks" Daily Activity  Outcome Measure Difficulty turning over in bed (including adjusting bedclothes, sheets and blankets)?: Unable Difficulty moving from lying on back to  sitting on the side of the bed? : Unable Difficulty sitting down on and standing up from a chair with arms (e.g., wheelchair, bedside commode, etc,.)?: Unable Help needed moving to and from a bed to chair (including a wheelchair)?: Total Help needed walking in hospital room?: Total Help needed climbing 3-5 steps with a railing? : Total 6 Click Score: 6    End of Session Equipment Utilized During Treatment: Gait belt Activity Tolerance: Patient tolerated treatment well Patient left: in bed;with call bell/phone within reach;with family/visitor present;with nursing/sitter in room(CNA in room assisting with toileting and will replace floor mat and activate bed alarm ) Nurse Communication: Mobility  status PT Visit Diagnosis: Unsteadiness on feet (R26.81);History of falling (Z91.81);Muscle weakness (generalized) (M62.81);Difficulty in walking, not elsewhere classified (R26.2)    Time: 2409-7353 PT Time Calculation (min) (ACUTE ONLY): 39 min   Charges:   PT Evaluation $PT Re-evaluation: 1 Re-eval PT Treatments $Therapeutic Exercise: 8-22 mins $Therapeutic Activity: 8-22 mins   PT G Codes:        DRoyetta Asal PT, DPT 10/15/17, 5:15 PM

## 2017-10-15 NOTE — Clinical Social Work Note (Signed)
CSW spoke with patient's son [name] and he chose [facility].  CSW contacted [facility], and they can accept patient once insurance has been approved.  CSW spoke to insurance company and they are going to start insurance auth.  Jones Broom. [place], MSW, [place]  10/15/2017 12:00 PM

## 2017-10-15 NOTE — Telephone Encounter (Signed)
Spoke with pt who states that he is still in the hospital. Unable to complete TCM at this time. Will check back next week. Pt states he may need to cx his hosp f/u apt if he goes to rehab. Will follow up on this. -MM

## 2017-10-15 NOTE — Progress Notes (Signed)
PHARMACY - PHYSICIAN COMMUNICATION CRITICAL VALUE ALERT - BLOOD CULTURE IDENTIFICATION (BCID)  Roger Rodriguez is an 77 y.o. male who presented to [place] on 10/12/2017  Assessment:  77 yo male with fever   Name of physician (or Provider) Contacted: Dr. Benjie Karvonen  Current antibiotics: Zosyn  Changes to prescribed antibiotics recommended: Continue Zosyn and cancel discharge order to SNF  Results for orders placed or performed during the hospital encounter of 10/12/17  Blood Culture ID Panel (Reflexed) (Collected: 10/14/2017  4:21 PM)  Result Value Ref Range   Enterococcus species NOT DETECTED NOT DETECTED   Listeria monocytogenes NOT DETECTED NOT DETECTED   Staphylococcus species NOT DETECTED NOT DETECTED   Staphylococcus aureus NOT DETECTED NOT DETECTED   Streptococcus species NOT DETECTED NOT DETECTED   Streptococcus agalactiae NOT DETECTED NOT DETECTED   Streptococcus pneumoniae NOT DETECTED NOT DETECTED   Streptococcus pyogenes NOT DETECTED NOT DETECTED   Acinetobacter baumannii NOT DETECTED NOT DETECTED   Enterobacteriaceae species DETECTED (A) NOT DETECTED   Enterobacter cloacae complex NOT DETECTED NOT DETECTED   Escherichia coli NOT DETECTED NOT DETECTED   Klebsiella oxytoca NOT DETECTED NOT DETECTED   Klebsiella pneumoniae NOT DETECTED NOT DETECTED   Proteus species DETECTED (A) NOT DETECTED   Serratia marcescens NOT DETECTED NOT DETECTED   Carbapenem resistance NOT DETECTED NOT DETECTED   Haemophilus influenzae NOT DETECTED NOT DETECTED   Neisseria meningitidis NOT DETECTED NOT DETECTED   Pseudomonas aeruginosa NOT DETECTED NOT DETECTED   Candida albicans NOT DETECTED NOT DETECTED   Candida glabrata NOT DETECTED NOT DETECTED   Candida krusei NOT DETECTED NOT DETECTED   Candida parapsilosis NOT DETECTED NOT DETECTED   Candida tropicalis NOT DETECTED NOT DETECTED    [name] 10/15/2017  1:59 PM

## 2017-10-15 NOTE — Care Management Important Message (Signed)
Important Message  Patient Details  Name: Roger Rodriguez MRN: [id number] Date of Birth: 02-02-1941   Medicare Important Message Given:  Yes    [name] A Christina Gintz 10/15/2017, 11:09 AM

## 2017-10-15 NOTE — Telephone Encounter (Signed)
Pt has questions regarding PTNS. Please return call at [phone].

## 2017-10-15 NOTE — Telephone Encounter (Signed)
Please find out what his question is and try to answer.    Hollice Espy, MD

## 2017-10-15 NOTE — Telephone Encounter (Signed)
Pt is currently in the hospital. Will try back in 1-2 weeks.  -MM

## 2017-10-16 LAB — GLUCOSE, CAPILLARY
Glucose-Capillary: 130 mg/dL — ABNORMAL HIGH (ref 65–99)
Glucose-Capillary: 156 mg/dL — ABNORMAL HIGH (ref 65–99)

## 2017-10-16 NOTE — Progress Notes (Signed)
Physical Therapy Treatment Patient Details Name: Roger Rodriguez MRN: [id number] DOB: [birth year] Today's Date: 10/16/2017    History of Present Illness Pt is a 77 y.o. male with a known history of Parkinson's disease, hypertension, overactive bladder and prostate cancer in the past, status post surgical treatment.  Patient was brought to emergency room for acute onset of confusion, lethargy, generalized weakness and fall, at home.  Per family, patient is acting more confused and he seems to be very weak and with slurred speech in the past 24 hrs. Per neurology notes and EMR it seems the patient has had issues with low blood pressure, poor balance, falls, intermittent confusion and noncompliance with his medication for the past 6 months or so. Blood test done in the emergency room are remarkable for elevated creatinine level at 2.29.  Hemoglobin is 12.6.  Glucose level is 182.  Brain CT scan and chest x-ray, were unremarkable. Patient is admitted for further evaluation and treatment.   Assessment includes: Sprain right knee with possible avulsion chip from medial femoral condyle, acute kidney injury, R hip pain with mechanical fall, Parkinson's disease, and essential HTN.       PT Comments    Pt in bed, ready to get up.  Incontinent of urine.  Cleaned and new pad applied as appropriate.  KI on upon arrival and left on for transfer.  To edge of bed with mod a x 2.  Once sitting able to sit with min guard.  He was able to stand today from raised bed with mod a +2.  Once standing, he required vc's to stand fully.  He was able to march in place on Left and SLR on Right.  He was able to take several small steps to pivot to recliner at bedside then remain standing for 2 minutes.  Overall tolerated well but will need +2 assist for tranfers and transitioning in and out of bed for pt and staff safety.     Follow Up Recommendations  SNF     Equipment Recommendations  None recommended by PT    Recommendations  for Other Services       Precautions / Restrictions Precautions Precautions: Fall Required Braces or Orthoses: Knee Immobilizer - Right Knee Immobilizer - Right: On at all times;On except when in CPM;Other (comment) Restrictions Weight Bearing Restrictions: Yes RLE Weight Bearing: Weight bearing as tolerated Other Position/Activity Restrictions: KI used for transfer    Mobility  Bed Mobility Overal bed mobility: Needs Assistance Bed Mobility: Supine to Sit;Sit to Supine;Rolling Rolling: Mod assist;Max assist   Supine to sit: Mod assist        Transfers Overall transfer level: Needs assistance Equipment used: Rolling walker (2 wheeled) Transfers: Sit to/from Stand Sit to Stand: Mod assist;+2 physical assistance            Ambulation/Gait Ambulation/Gait assistance: Min assist;+2 physical assistance Ambulation Distance (Feet): 3 Feet Assistive device: Rolling walker (2 wheeled) Gait Pattern/deviations: Decreased step length - right;Decreased step length - left;Trunk flexed   Gait velocity interpretation: <1.8 ft/sec, indicative of risk for recurrent falls General Gait Details: Pt able to advance his feet on his own today.  No LOB noted but should be +2 assist for safety.   Stairs            Wheelchair Mobility    Modified Rankin (Stroke Patients Only)       Balance Overall balance assessment: Needs assistance Sitting-balance support: Feet unsupported;Bilateral upper extremity supported Sitting balance-Leahy Scale: Fair  Standing balance support: Bilateral upper extremity supported Standing balance-Leahy Scale: Poor Standing balance comment: Heavy reliance on BUE support                             Cognition Arousal/Alertness: Awake/alert Behavior During Therapy: WFL for tasks assessed/performed Overall Cognitive Status: Within Functional Limits for tasks assessed                                        Exercises  Other Exercises Other Exercises: Inc urine.  Requires assist for rolling and for care.      General Comments        Pertinent Vitals/Pain Pain Assessment: No/denies pain Pain Intervention(s): Monitored during session    Home Living                      Prior Function            PT Goals (current goals can now be found in the care plan section) Progress towards PT goals: Progressing toward goals    Frequency    7X/week      PT Plan Current plan remains appropriate    Co-evaluation              AM-PAC PT "6 Clicks" Daily Activity  Outcome Measure  Difficulty turning over in bed (including adjusting bedclothes, sheets and blankets)?: Unable Difficulty moving from lying on back to sitting on the side of the bed? : Unable Difficulty sitting down on and standing up from a chair with arms (e.g., wheelchair, bedside commode, etc,.)?: Unable Help needed moving to and from a bed to chair (including a wheelchair)?: A Lot Help needed walking in hospital room?: A Lot Help needed climbing 3-5 steps with a railing? : Total 6 Click Score: 8    End of Session Equipment Utilized During Treatment: Gait belt Activity Tolerance: Patient tolerated treatment well Patient left: in chair;with chair alarm set;with call bell/phone within reach;with family/visitor present Nurse Communication: Mobility status       Time: 1050-1111 PT Time Calculation (min) (ACUTE ONLY): 21 min  Charges:  $Therapeutic Activity: 8-22 mins                    G Codes:       Chesley Noon, PTA 10/16/17, 11:25 AM

## 2017-10-16 NOTE — Progress Notes (Signed)
Roger Rodriguez at [place] NAME: Roger Rodriguez    MR#:  [id number]  DATE OF BIRTH:  04-02-1941  SUBJECTIVE:  Patient without issues today  REVIEW OF SYSTEMS:    Review of Systems  Constitutional:  ++ fever, nochills weight loss HENT: Negative for ear pain, nosebleeds, congestion, facial swelling, rhinorrhea, neck pain, neck stiffness and ear discharge.   Respiratory: Negative for cough, shortness of breath, wheezing  Cardiovascular: Negative for chest pain, palpitations and leg swelling.  Gastrointestinal: Negative for heartburn, abdominal pain, vomiting, diarrhea or consitpation Genitourinary: Negative for dysuria, urgency, frequency, hematuria Musculoskeletal: Negative for back pain or joint pain Neurological: Negative for dizziness, seizures, syncope, focal weakness,  numbness and headaches.  Shuffling gait, parkinson's Hematological: Does not bruise/bleed easily.  Psychiatric/Behavioral: Negative for hallucinations, confusion, dysphoric mood    Tolerating Diet: yes      DRUG ALLERGIES:  No Known Allergies  VITALS:  Blood pressure 124/63, pulse 84, temperature 98.5 F (36.9 C), temperature source Axillary, resp. rate 16, height 5\' 11"  (1.803 m), weight 109.8 kg (242 lb), SpO2 96 %.  PHYSICAL EXAMINATION:  Constitutional: Appears well-developed and well-nourished. No distress. HENT: Normocephalic. [name] Oropharynx is clear and moist.  Eyes: Conjunctivae and EOM are normal. PERRLA, no scleral icterus.  Neck: Normal ROM. Neck supple. No JVD. No tracheal deviation. CVS: RRR, S1/S2 +, no murmurs, no gallops, no carotid bruit.  Pulmonary: Effort and breath sounds normal, no stridor, rhonchi, wheezes, rales.  Abdominal: Soft. BS +,  no distension, tenderness, rebound or guarding.  Musculoskeletal: Normal range of motion. No edema and no tenderness.  Neuro: Alert. CN 2-12 grossly intact. No focal deficits. Skin: Skin is warm and dry. No rash  noted. Psychiatric: Normal mood and affect.      LABORATORY PANEL:   CBC Recent Labs  Lab 10/15/17 0427  WBC 7.3  HGB 11.2*  HCT 33.0*  PLT 140*   ------------------------------------------------------------------------------------------------------------------  Chemistries  Recent Labs  Lab 10/14/17 0953  NA 136  K 5.0  CL 104  CO2 23  GLUCOSE 197*  BUN 38*  CREATININE 1.75*  CALCIUM 8.6*   ------------------------------------------------------------------------------------------------------------------  Cardiac Enzymes Recent Labs  Lab 10/12/17 1936  TROPONINI <0.03   ------------------------------------------------------------------------------------------------------------------  RADIOLOGY:  Dg Chest 1 View  Result Date: 10/15/2017 CLINICAL DATA:  Difficulty breathing EXAM: CHEST  1 VIEW COMPARISON:  October 14, 2017 FINDINGS: No edema or consolidation. Heart remains upper normal in size with pulmonary vascularity within normal limits. No adenopathy. There is degenerative change in each shoulder with superior migration of each humeral head. There is postoperative change in the right shoulder region. IMPRESSION: No edema or consolidation. Stable cardiac silhouette. No evident adenopathy. Chronic rotator cuff tears bilaterally with superior migration of each humeral head. Electronically Signed   By: Lowella Grip III M.D.   On: 10/15/2017 07:34   Dg Chest 1 View  Result Date: 10/14/2017 CLINICAL DATA:  Fever.  Recent fall EXAM: CHEST  1 VIEW COMPARISON:  October 12, 2017 FINDINGS: No edema or consolidation. Heart is upper normal in size with pulmonary vascularity within normal limits. No adenopathy. No pneumothorax. There is degenerative change in each shoulder. There is postoperative change in the right shoulder region. There are scattered foci of carotid artery calcification. IMPRESSION: No edema or consolidation. Heart upper normal in size. Foci of carotid artery  calcification noted. There is arthropathy in both shoulders with superior migration of each humeral head, a finding indicative of chronic rotator  cuff tears. Electronically Signed   By: Lowella Grip III M.D.   On: 10/14/2017 13:37     ASSESSMENT AND PLAN:   77 year old male with history of Parkinson's disease who presents after a mechanical fall.  1.  Acute kidney injury in the setting of dehydration and lisinopril: Patient's creatinine has improved and is near baseline. Patient will need to follow-up with his PCP for repeat BMP at the end of the week.  2.  Right hip pain after mechanical fall without evidence of acute fracture Knee x-ray was also negative for acute fracture.  Physical therapy is recommending skilled nursing facility upon discharge   3.  Parkinson disease: Patient will continue outpatient medications with outpatient follow-up with his neurologist. 4.  Acute encephalopathy, metabolic due to acute kidney injury: CT the head was negative for acute CVA.  His mental status is at baseline. MRI brain was negative as well. 5.  Essential hypertension: Due to underlying kidney issues blood pressure medications has been changed from lisinopril to Norvasc.   6. Fever: Patient had a fever.  It is unclear the source of fever.  His UA and chest x-ray were negative.  I had orthopedic evaluation due to the right knee pain.  It does not appear the patient has septic joint. He will be discharged empirically on Augmentin 7.  Right knee sprain with possible avulsion chip: Patient was evaluated with oblique surgery.  Patient will continue with Ace wrap and knee immobilizer.  Weightbearing as tolerated on leg with walker.  Patient will follow up with Dr. Sabra Heck in 1 week     Management plans discussed with the patient and son and they are is in agreement.  CODE STATUS: full  TOTAL TIME TAKING CARE OF THIS PATIENT: 24 minutes.     POSSIBLE D/C today to SNF, Deloise Marchant M.D  on 10/16/2017 at 11:02 AM  Between 7am to 6pm - Pager - [phone] After 6pm go to [email or web address] - password [email or web address] [name]  Office  [phone]  CC: Primary care physician; [name]., MD  Note: This dictation was prepared with Dragon dictation along with smaller phrase technology. Any transcriptional errors that result from this process are unintentional.

## 2017-10-16 NOTE — Progress Notes (Signed)
[facility] [place] NAME: Roger Rodriguez    MR#:  [id number]  DATE OF BIRTH:  12/13/1940  SUBJECTIVE:  Patient with temp 100.4 this am Had BM yesterday   REVIEW OF SYSTEMS:    Review of Systems  Constitutional:  Low grade fever, no chills weight loss + generalized weakness HENT: Negative for ear pain, nosebleeds, congestion, facial swelling, rhinorrhea, neck pain, neck stiffness and ear discharge.   Respiratory: Negative for cough, shortness of breath, wheezing  Cardiovascular: Negative for chest pain, palpitations and leg swelling.  Gastrointestinal: Negative for heartburn, abdominal pain, vomiting, diarrhea or consitpation Genitourinary: Negative for dysuria, urgency, frequency, hematuria Musculoskeletal: Negative for back pain or joint pain Neurological: Negative for dizziness, seizures, syncope, focal weakness,  numbness and headaches.  ++, parkinson's Hematological: Does not bruise/bleed easily.  Psychiatric/Behavioral: Negative for hallucinations, confusion, dysphoric mood    Tolerating Diet: yes      DRUG ALLERGIES:  No Known Allergies  VITALS:  Blood pressure 124/63, pulse 84, temperature 98.5 F (36.9 C), temperature source Axillary, resp. rate 16, height 5\' 11"  (1.803 m), weight 109.8 kg (242 lb), SpO2 96 %.  PHYSICAL EXAMINATION:  Constitutional: Appears well-developed and well-nourished. No distress. HENT: Normocephalic. [name] Oropharynx is clear and moist.  Eyes: Conjunctivae and EOM are normal. PERRLA, no scleral icterus.  Neck: Normal ROM. Neck supple. No JVD. No tracheal deviation. CVS: RRR, S1/S2 +, no murmurs, no gallops, no carotid bruit.  Pulmonary: Effort and breath sounds normal, no stridor, rhonchi, wheezes, rales.  Abdominal: Soft. BS +,  no distension, tenderness, rebound or guarding.  Musculoskeletal: Normal range of motion. No edema and no tenderness.  Neuro: Alert. CN 2-12 grossly intact. No focal  deficits. Skin: Skin is warm and dry. No rash noted. Psychiatric: Normal mood and affect.      LABORATORY PANEL:   CBC Recent Labs  Lab 10/15/17 0427  WBC 7.3  HGB 11.2*  HCT 33.0*  PLT 140*   ------------------------------------------------------------------------------------------------------------------  Chemistries  Recent Labs  Lab 10/14/17 0953  NA 136  K 5.0  CL 104  CO2 23  GLUCOSE 197*  BUN 38*  CREATININE 1.75*  CALCIUM 8.6*   ------------------------------------------------------------------------------------------------------------------  Cardiac Enzymes Recent Labs  Lab 10/12/17 1936  TROPONINI <0.03   ------------------------------------------------------------------------------------------------------------------  RADIOLOGY:  Dg Chest 1 View  Result Date: 10/15/2017 CLINICAL DATA:  Difficulty breathing EXAM: CHEST  1 VIEW COMPARISON:  October 14, 2017 FINDINGS: No edema or consolidation. Heart remains upper normal in size with pulmonary vascularity within normal limits. No adenopathy. There is degenerative change in each shoulder with superior migration of each humeral head. There is postoperative change in the right shoulder region. IMPRESSION: No edema or consolidation. Stable cardiac silhouette. No evident adenopathy. Chronic rotator cuff tears bilaterally with superior migration of each humeral head. Electronically Signed   By: Lowella Grip III M.D.   On: 10/15/2017 07:34   Dg Chest 1 View  Result Date: 10/14/2017 CLINICAL DATA:  Fever.  Recent fall EXAM: CHEST  1 VIEW COMPARISON:  October 12, 2017 FINDINGS: No edema or consolidation. Heart is upper normal in size with pulmonary vascularity within normal limits. No adenopathy. No pneumothorax. There is degenerative change in each shoulder. There is postoperative change in the right shoulder region. There are scattered foci of carotid artery calcification. IMPRESSION: No edema or consolidation. Heart  upper normal in size. Foci of carotid artery calcification noted. There is arthropathy in both shoulders with superior migration  of each humeral head, a finding indicative of chronic rotator cuff tears. Electronically Signed   By: Lowella Grip III M.D.   On: 10/14/2017 13:37     ASSESSMENT AND PLAN:   77 year old male with history of Parkinson's disease who presents after a mechanical fall.  1.  Acute kidney injury in the setting of dehydration and lisinopril: Patient's creatinine has improved.  2.  Right hip pain after mechanical fall without evidence of acute fracture Physical therapy is recommending skilled nursing facility upon discharge   3.  Parkinson disease: Patient will continue outpatient medications with outpatient follow-up with his neurologist.  4.  Acute encephalopathy, metabolic due to acute kidney injury: CT the head was negative for acute CVA.  His mental status is at baseline. MRI brain was negative as well.  5.  Essential hypertension: Due to underlying kidney issues blood pressure medications was discontinued.Since his blood pressure has been low/normal he will not need hypertensive medications at this time.  Blood pressure can be followed up as an outpatient.    6.  Pseudomonas bacteremia:  Await final culture with sensitivities  Continue Zosyn   7.  Right knee sprain with possible avulsion chip: Patient was evaluated with oblique surgery.  Patient will continue with Ace wrap and knee immobilizer.  Weightbearing as tolerated on leg with walker.  Patient will follow up with Dr. Sabra Heck in 1 week     Management plans discussed with the patient and son and they are is in agreement.  CODE STATUS: full  TOTAL TIME TAKING CARE OF THIS PATIENT: 24 minutes.     POSSIBLE D/C tomorrow to SNF, [name] M.D on 10/16/2017 at 11:03 AM  Between 7am to 6pm - Pager - [phone] After 6pm go to [email or web address] - password [email or web address] [facility]   Office  [phone]  CC: Primary care physician; [name]., MD  Note: This dictation was prepared with Dragon dictation along with smaller phrase technology. Any transcriptional errors that result from this process are unintentional.

## 2017-10-17 DIAGNOSIS — Z7901 Long term (current) use of anticoagulants: Secondary | ICD-10-CM | POA: Diagnosis not present

## 2017-10-17 DIAGNOSIS — M7989 Other specified soft tissue disorders: Secondary | ICD-10-CM | POA: Diagnosis not present

## 2017-10-17 DIAGNOSIS — L89622 Pressure ulcer of left heel, stage 2: Secondary | ICD-10-CM | POA: Diagnosis not present

## 2017-10-17 DIAGNOSIS — F322 Major depressive disorder, single episode, severe without psychotic features: Secondary | ICD-10-CM | POA: Diagnosis not present

## 2017-10-17 DIAGNOSIS — F05 Delirium due to known physiological condition: Secondary | ICD-10-CM | POA: Diagnosis not present

## 2017-10-17 DIAGNOSIS — R509 Fever, unspecified: Secondary | ICD-10-CM | POA: Diagnosis not present

## 2017-10-17 DIAGNOSIS — M6281 Muscle weakness (generalized): Secondary | ICD-10-CM | POA: Diagnosis not present

## 2017-10-17 DIAGNOSIS — G9340 Encephalopathy, unspecified: Secondary | ICD-10-CM | POA: Diagnosis not present

## 2017-10-17 DIAGNOSIS — F0280 Dementia in other diseases classified elsewhere without behavioral disturbance: Secondary | ICD-10-CM | POA: Diagnosis not present

## 2017-10-17 DIAGNOSIS — Z96651 Presence of right artificial knee joint: Secondary | ICD-10-CM | POA: Diagnosis not present

## 2017-10-17 DIAGNOSIS — M25569 Pain in unspecified knee: Secondary | ICD-10-CM | POA: Diagnosis not present

## 2017-10-17 DIAGNOSIS — Z87891 Personal history of nicotine dependence: Secondary | ICD-10-CM | POA: Diagnosis not present

## 2017-10-17 DIAGNOSIS — N179 Acute kidney failure, unspecified: Secondary | ICD-10-CM | POA: Diagnosis not present

## 2017-10-17 DIAGNOSIS — S83421A Sprain of lateral collateral ligament of right knee, initial encounter: Secondary | ICD-10-CM | POA: Diagnosis not present

## 2017-10-17 DIAGNOSIS — R531 Weakness: Secondary | ICD-10-CM | POA: Diagnosis not present

## 2017-10-17 DIAGNOSIS — L539 Erythematous condition, unspecified: Secondary | ICD-10-CM | POA: Diagnosis not present

## 2017-10-17 DIAGNOSIS — Z7401 Bed confinement status: Secondary | ICD-10-CM | POA: Diagnosis not present

## 2017-10-17 DIAGNOSIS — R7881 Bacteremia: Secondary | ICD-10-CM | POA: Diagnosis not present

## 2017-10-17 DIAGNOSIS — A419 Sepsis, unspecified organism: Secondary | ICD-10-CM | POA: Diagnosis not present

## 2017-10-17 DIAGNOSIS — E860 Dehydration: Secondary | ICD-10-CM | POA: Diagnosis not present

## 2017-10-17 DIAGNOSIS — Z79899 Other long term (current) drug therapy: Secondary | ICD-10-CM | POA: Diagnosis not present

## 2017-10-17 DIAGNOSIS — M79609 Pain in unspecified limb: Secondary | ICD-10-CM | POA: Diagnosis not present

## 2017-10-17 DIAGNOSIS — G2581 Restless legs syndrome: Secondary | ICD-10-CM | POA: Diagnosis not present

## 2017-10-17 DIAGNOSIS — N189 Chronic kidney disease, unspecified: Secondary | ICD-10-CM | POA: Diagnosis not present

## 2017-10-17 DIAGNOSIS — R2681 Unsteadiness on feet: Secondary | ICD-10-CM | POA: Diagnosis not present

## 2017-10-17 DIAGNOSIS — R2241 Localized swelling, mass and lump, right lower limb: Secondary | ICD-10-CM | POA: Diagnosis present

## 2017-10-17 DIAGNOSIS — J189 Pneumonia, unspecified organism: Secondary | ICD-10-CM | POA: Diagnosis not present

## 2017-10-17 DIAGNOSIS — I959 Hypotension, unspecified: Secondary | ICD-10-CM | POA: Diagnosis not present

## 2017-10-17 DIAGNOSIS — S8391XA Sprain of unspecified site of right knee, initial encounter: Secondary | ICD-10-CM | POA: Diagnosis not present

## 2017-10-17 DIAGNOSIS — S8391XD Sprain of unspecified site of right knee, subsequent encounter: Secondary | ICD-10-CM | POA: Diagnosis not present

## 2017-10-17 DIAGNOSIS — M1712 Unilateral primary osteoarthritis, left knee: Secondary | ICD-10-CM | POA: Diagnosis not present

## 2017-10-17 DIAGNOSIS — I10 Essential (primary) hypertension: Secondary | ICD-10-CM | POA: Diagnosis not present

## 2017-10-17 DIAGNOSIS — M25532 Pain in left wrist: Secondary | ICD-10-CM | POA: Diagnosis not present

## 2017-10-17 DIAGNOSIS — R278 Other lack of coordination: Secondary | ICD-10-CM | POA: Diagnosis not present

## 2017-10-17 DIAGNOSIS — G20 Parkinson's disease: Secondary | ICD-10-CM | POA: Diagnosis not present

## 2017-10-17 DIAGNOSIS — L03115 Cellulitis of right lower limb: Secondary | ICD-10-CM | POA: Diagnosis not present

## 2017-10-17 DIAGNOSIS — R1310 Dysphagia, unspecified: Secondary | ICD-10-CM | POA: Diagnosis not present

## 2017-10-17 LAB — GLUCOSE, CAPILLARY: Glucose-Capillary: 116 mg/dL — ABNORMAL HIGH (ref 65–99)

## 2017-10-17 LAB — CULTURE, BLOOD (ROUTINE X 2): Special Requests: ADEQUATE

## 2017-10-17 MED ORDER — HYDROCODONE-ACETAMINOPHEN 5-325 MG PO TABS: 1.0000 | ORAL_TABLET | Freq: Four times a day (QID) | ORAL | 0 refills | Status: DC | PRN

## 2017-10-17 MED ORDER — PRAMIPEXOLE DIHYDROCHLORIDE 0.5 MG PO TABS: 1.0000 mg | ORAL_TABLET | Freq: Every day | ORAL | 0 refills | Status: DC

## 2017-10-17 MED ORDER — CARBIDOPA-LEVODOPA ER 50-200 MG PO TBCR: 2.0000 | EXTENDED_RELEASE_TABLET | Freq: Four times a day (QID) | ORAL | 0 refills | Status: DC

## 2017-10-17 NOTE — Clinical Social Work Note (Addendum)
The patient will discharge to [facility] today via non-emergent EMS. The patient, his sons, and the facility are aware and in agreement. [facility] has requested EMS transport after 2 pm in order to have the patient's room ready. CSW has updated [facility] of the discharge. The CSW has sent all updated paperwork to the facility and has delivered the discharge packet to the chart. The CSW is signing off. Please consult should additional needs arise.  Santiago Bumpers, MSW, [facility] [phone]

## 2017-10-17 NOTE — Progress Notes (Signed)
EMS present for pt discharge; discharge packet given to EMS personnel; pt discharged via stretcher by EMS personnel to [facility] in [place], [place]

## 2017-10-17 NOTE — Progress Notes (Signed)
Telephone call to EMS ([phone]) for nonemergency transport from Room 104 to [facility] in [place], [place]; pt is 1st up for transport

## 2017-10-17 NOTE — Progress Notes (Addendum)
MD order received to discharge pt to SNF today; Care Management/Social Worker previously prepared discharge packet for pt to be discharged to SNF/ [facility] in [place], [place] by EMS; telephone call to [facility] [phone] gave report to April; ? Carbidopa-levodopa discharge orders (2 orders diferrent doses and times); need to clarify with Dr Benjie Karvonen and call April back at [facility]

## 2017-10-17 NOTE — Progress Notes (Signed)
Per telephone call with Dr Benjie Karvonen to clarify discharging medications; Dr Benjie Karvonen verbalized to discontinue carbidopa-levodopa 25-250 1 tab TID order; marked through order in AVS with note for [facility]

## 2017-10-17 NOTE — Progress Notes (Signed)
Telephone call to [facility] [phone] spoke with April; advised April that the discharge medication order for carbidopa-levodopa has been clarified and the pt will be on 50-200mg  2 tabs qid; pt to be admitted to Room 204; to call EMS for non emergency transport

## 2017-10-18 NOTE — Telephone Encounter (Signed)
Spoke w/ pt, he wanted to let us know he was in rehab and could not make his next two appointments for ptns.

## 2017-10-18 NOTE — Telephone Encounter (Signed)
Pt was d/c to a SNF. TCM not needed.  -MM

## 2017-10-19 DIAGNOSIS — L89622 Pressure ulcer of left heel, stage 2: Secondary | ICD-10-CM | POA: Diagnosis not present

## 2017-10-19 LAB — CULTURE, BLOOD (ROUTINE X 2)
Culture: NO GROWTH
Special Requests: ADEQUATE

## 2017-10-20 ENCOUNTER — Ambulatory Visit: Payer: PPO | Admitting: Urology

## 2017-10-25 DIAGNOSIS — F0280 Dementia in other diseases classified elsewhere without behavioral disturbance: Secondary | ICD-10-CM | POA: Diagnosis not present

## 2017-10-25 DIAGNOSIS — G20 Parkinson's disease: Secondary | ICD-10-CM | POA: Diagnosis not present

## 2017-10-25 DIAGNOSIS — G2581 Restless legs syndrome: Secondary | ICD-10-CM | POA: Diagnosis not present

## 2017-10-26 ENCOUNTER — Encounter: Payer: PPO | Admitting: Family Medicine

## 2017-10-26 DIAGNOSIS — L89622 Pressure ulcer of left heel, stage 2: Secondary | ICD-10-CM | POA: Diagnosis not present

## 2017-10-26 DIAGNOSIS — S83421A Sprain of lateral collateral ligament of right knee, initial encounter: Secondary | ICD-10-CM | POA: Diagnosis not present

## 2017-10-26 DIAGNOSIS — Z96651 Presence of right artificial knee joint: Secondary | ICD-10-CM | POA: Diagnosis not present

## 2017-10-26 DIAGNOSIS — M1712 Unilateral primary osteoarthritis, left knee: Secondary | ICD-10-CM | POA: Diagnosis not present

## 2017-10-27 ENCOUNTER — Ambulatory Visit: Payer: PPO | Admitting: Urology

## 2017-11-01 ENCOUNTER — Telehealth: Payer: Self-pay

## 2017-11-01 NOTE — Telephone Encounter (Signed)
LM for pt to CB and schedule AWV and CPE. -MM

## 2017-11-02 DIAGNOSIS — L89622 Pressure ulcer of left heel, stage 2: Secondary | ICD-10-CM | POA: Diagnosis not present

## 2017-11-02 NOTE — Telephone Encounter (Signed)
Pt called saying he is still in rehab and he will call when he gets out and resch.  Thanks [employer]

## 2017-11-03 ENCOUNTER — Ambulatory Visit: Payer: PPO

## 2017-11-05 ENCOUNTER — Other Ambulatory Visit: Payer: Self-pay | Admitting: *Deleted

## 2017-11-05 NOTE — Patient Outreach (Signed)
[facility]) Care Management  11/05/2017  DEMICHAEL TRAUM 10/23/1940 [id number]   CSW received referral for pt as he is at [facility]  SNF. CSW contacted SNF rep who reports pt is there and planning for home eval on 11/11/2017.  CSW will plan a SNF visit next week to introduce self and [facility] program to pt.    Eduard Clos, MSW, [place] Worker  [place] [phone]

## 2017-11-07 DIAGNOSIS — L539 Erythematous condition, unspecified: Secondary | ICD-10-CM | POA: Diagnosis not present

## 2017-11-09 DIAGNOSIS — L89622 Pressure ulcer of left heel, stage 2: Secondary | ICD-10-CM | POA: Diagnosis not present

## 2017-11-10 ENCOUNTER — Ambulatory Visit: Payer: PPO

## 2017-11-11 ENCOUNTER — Other Ambulatory Visit: Payer: Self-pay | Admitting: *Deleted

## 2017-11-11 NOTE — Patient Outreach (Signed)
[facility]) Care Management  11/11/2017  Roger Rodriguez 12-10-1940 [id number]   CSW received phone call from pt after he found info CSW left at [facility] SNF earlier this week. CSW confirmed identity with name, address and DOB. Pt reports he found the [facility] packet and CSW encouraged him to review. He reports a home eval will be done today at 2pm to assess his ability to return home soon.  He lives alone but reports plans for caregiver at home once released.   CSW will plan phone f/u call tomorrow and not a SNF visit as previously planned as he states he may go home tomorrow and prefers a call.   CSW will phone outreach pt and SNF rep tomorrow.     Eduard Clos, MSW, [place] Worker  [place] [phone]

## 2017-11-12 ENCOUNTER — Emergency Department (HOSPITAL_BASED_OUTPATIENT_CLINIC_OR_DEPARTMENT_OTHER)
Admit: 2017-11-12 | Discharge: 2017-11-12 | Disposition: A | Discharge status: Home / Self Care | Payer: PPO | Attending: Emergency Medicine | Admitting: Emergency Medicine

## 2017-11-12 ENCOUNTER — Other Ambulatory Visit: Payer: Self-pay | Admitting: *Deleted

## 2017-11-12 ENCOUNTER — Encounter (HOSPITAL_COMMUNITY): Payer: Self-pay | Admitting: Emergency Medicine

## 2017-11-12 ENCOUNTER — Emergency Department (HOSPITAL_COMMUNITY)
Admission: EM | Admit: 2017-11-12 | Discharge: 2017-11-12 | Disposition: A | Discharge status: Home / Self Care | Payer: PPO | Attending: Emergency Medicine | Admitting: Emergency Medicine

## 2017-11-12 ENCOUNTER — Ambulatory Visit: Payer: Self-pay | Admitting: *Deleted

## 2017-11-12 ENCOUNTER — Emergency Department (HOSPITAL_COMMUNITY): Payer: PPO

## 2017-11-12 DIAGNOSIS — Z79899 Other long term (current) drug therapy: Secondary | ICD-10-CM | POA: Diagnosis not present

## 2017-11-12 DIAGNOSIS — I10 Essential (primary) hypertension: Secondary | ICD-10-CM | POA: Diagnosis not present

## 2017-11-12 DIAGNOSIS — L03115 Cellulitis of right lower limb: Secondary | ICD-10-CM

## 2017-11-12 DIAGNOSIS — Z7901 Long term (current) use of anticoagulants: Secondary | ICD-10-CM | POA: Diagnosis not present

## 2017-11-12 DIAGNOSIS — M79609 Pain in unspecified limb: Secondary | ICD-10-CM

## 2017-11-12 DIAGNOSIS — G20 Parkinson's disease: Secondary | ICD-10-CM | POA: Insufficient documentation

## 2017-11-12 DIAGNOSIS — Z96651 Presence of right artificial knee joint: Secondary | ICD-10-CM | POA: Diagnosis not present

## 2017-11-12 DIAGNOSIS — M7989 Other specified soft tissue disorders: Secondary | ICD-10-CM

## 2017-11-12 DIAGNOSIS — Z87891 Personal history of nicotine dependence: Secondary | ICD-10-CM | POA: Diagnosis not present

## 2017-11-12 DIAGNOSIS — R531 Weakness: Secondary | ICD-10-CM | POA: Diagnosis not present

## 2017-11-12 LAB — COMPREHENSIVE METABOLIC PANEL
ALT: 5 U/L — ABNORMAL LOW (ref 17–63)
AST: 16 U/L (ref 15–41)
Albumin: 2.9 g/dL — ABNORMAL LOW (ref 3.5–5.0)
Alkaline Phosphatase: 77 U/L (ref 38–126)
Anion gap: 10 (ref 5–15)
BUN: 15 mg/dL (ref 6–20)
CO2: 29 mmol/L (ref 22–32)
Calcium: 8.5 mg/dL — ABNORMAL LOW (ref 8.9–10.3)
Chloride: 97 mmol/L — ABNORMAL LOW (ref 101–111)
Creatinine, Ser: 0.84 mg/dL (ref 0.61–1.24)
GFR calc Af Amer: >60 mL/min (ref >60)
GFR calc non Af Amer: >60 mL/min (ref >60)
Glucose, Bld: 192 mg/dL — ABNORMAL HIGH (ref 65–99)
Potassium: 3.9 mmol/L (ref 3.5–5.1)
Sodium: 136 mmol/L (ref 135–145)
Total Bilirubin: 0.7 mg/dL (ref 0.3–1.2)
Total Protein: 7.2 g/dL (ref 6.5–8.1)

## 2017-11-12 LAB — CBC WITH DIFFERENTIAL/PLATELET
Basophils Absolute: 0 10*3/uL (ref 0.0–0.1)
Basophils Relative: 0 %
Eosinophils Absolute: 0.3 10*3/uL (ref 0.0–0.7)
Eosinophils Relative: 4 %
HCT: 36.4 % — ABNORMAL LOW (ref 39.0–52.0)
Hemoglobin: 11.8 g/dL — ABNORMAL LOW (ref 13.0–17.0)
Lymphocytes Relative: 18 %
Lymphs Abs: 1.2 10*3/uL (ref 0.7–4.0)
MCH: 31 pg (ref 26.0–34.0)
MCHC: 32.4 g/dL (ref 30.0–36.0)
MCV: 95.5 fL (ref 78.0–100.0)
Monocytes Absolute: 0.8 10*3/uL (ref 0.1–1.0)
Monocytes Relative: 13 %
Neutro Abs: 4.1 10*3/uL (ref 1.7–7.7)
Neutrophils Relative %: 65 %
Platelets: 216 10*3/uL (ref 150–400)
RBC: 3.81 MIL/uL — ABNORMAL LOW (ref 4.22–5.81)
RDW: 13.8 % (ref 11.5–15.5)
WBC: 6.3 10*3/uL (ref 4.0–10.5)

## 2017-11-12 MED ORDER — SULFAMETHOXAZOLE-TRIMETHOPRIM 800-160 MG PO TABS
1.0000 | ORAL_TABLET | Freq: Once | ORAL | Status: AC
  Administered 2017-11-12: 1 via ORAL
  Filled 2017-11-12: qty 1

## 2017-11-12 MED ORDER — SULFAMETHOXAZOLE-TRIMETHOPRIM 800-160 MG PO TABS: 1.0000 | ORAL_TABLET | Freq: Two times a day (BID) | ORAL | 0 refills | Status: AC

## 2017-11-12 MED ORDER — SODIUM CHLORIDE 0.9 % IV BOLUS
500.0000 mL | Freq: Once | INTRAVENOUS | Status: AC
  Administered 2017-11-12: 500 mL via INTRAVENOUS

## 2017-11-12 NOTE — Discharge Instructions (Addendum)
As discussed, today's evaluation has been generally reassuring. However, with concern for cellulitis, given your recent surgery it is important he follow-up with orthopedist next week for repeat evaluation. If you develop new, or concerning changes in your condition return here for further evaluation.

## 2017-11-12 NOTE — ED Provider Notes (Signed)
[facility] EMERGENCY DEPARTMENT Provider Note   CSN: [id number] Arrival date & time: 11/12/17  1633     History   Chief Complaint Chief Complaint  Patient presents with  . Leg Swelling    HPI Roger Rodriguez is a 77 y.o. male.  HPI  Patient presents with concern of redness and swelling in the right leg. Patient has multiple medical issues including Parkinson's disease, arthritis, and little more than 1 month ago had a right knee arthroplasty. He notes that he has been recovering generally well at a nursing facility.  On over the past day or so he has noticed swelling in the leg, without fever, without nausea, without of the pain or loss of function distally.  No clear precipitant.  He denies distal loss of sensation or strength, though he has notable baseline weakness everywhere. He was sent here for evaluation. It is unclear if he has been provided medication for intervention, though there are no nursing record of new medication.     Past Medical History:  Diagnosis Date  . Allergy    allergic rhinitis  . Arthritis    OA  . Chronic kidney disease    stones  . Depression   . Family history of adverse reaction to anesthesia   . Hematuria   . History of kidney stones   . Hyperlipidemia   . Hypertension    no meds since parkinsons meds started  . Incontinence of urine   . Inguinal hernia   . Neuromuscular disorder ([place])    parkinsons  . Overactive bladder   . Parkinson's disease ([place])   . Prostate cancer ([place])   . Rhinitis, allergic   . Shortness of breath dyspnea    doe secondary to parkinsons    Patient Active Problem List   Diagnosis Date Noted  . ARF (acute renal failure) ([place]) 10/12/2017  . RUQ pain 10/22/2016  . Depression, major, single episode, moderate ([place]) 10/22/2016  . Renal cyst, right 09/28/2016  . Hepatic steatosis 09/28/2016  . Parkinson's disease ([place]) 09/18/2015  . Restless leg 09/18/2015  . Malignant neoplasm of  prostate ([place]) 05/22/2015  . Hepatitis A 04/02/2015  . Allergic rhinitis 04/02/2015  . Arthritis 04/02/2015  . Narrowing of intervertebral disc space 04/02/2015  . 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 Accumulation of fluid in tissues 04/02/2015  . Essential (primary) hypertension 04/02/2015  . Borderline diabetes 04/02/2015  . Hyperlipidemia 04/02/2015  . BP (high blood pressure) 04/02/2015  . Infected sebaceous cyst 04/02/2015  . Hernia, inguinal 04/02/2015  . Leg pain 04/02/2015  . Lumbar canal stenosis 04/02/2015  . 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 Malignant melanoma ([place]) 04/02/2015  . Arthritis, degenerative 04/02/2015  . Fatigue 04/02/2015  . Idiopathic Parkinson's disease ([place]) 04/02/2015  . Plantar fasciitis 04/02/2015  . CA of prostate ([place]) 04/02/2015  . Benign essential tremor 04/02/2015  . Lower urinary tract infection 04/02/2015  . Degeneration of intervertebral disc of lumbosacral region 05/10/2012    Past Surgical History:  Procedure Laterality Date  . BACK SURGERY  2014  . CYSTOSCOPY WITH STENT PLACEMENT Right 01/13/2016   Procedure: CYSTOSCOPY WITH STENT PLACEMENT;  Surgeon: Hollice Espy, MD;  Location: [facility] ORS;  Service: Urology;  Laterality: Right;  . CYSTOSCOPY/URETEROSCOPY/HOLMIUM LASER/STENT PLACEMENT Left 11/20/2015   Procedure: CYSTOSCOPY/URETEROSCOPY/HOLMIUM LASER/STENT PLACEMENT;  Surgeon: Hollice Espy, MD;  Location: [facility] ORS;  Service: Urology;  Laterality: Left;  . EYE SURGERY    . FRACTURE SURGERY  ankle   . H/O arthroscopy of right knee  2015  . H/O radical protatectomy    . HERNIA REPAIR    . JOINT REPLACEMENT     right knee  . LITHOTRIPSY  2010  . Rotator Cuff tear  1993   Repaired  . URETEROSCOPY WITH HOLMIUM LASER LITHOTRIPSY  2002?  [name] URETEROSCOPY WITH HOLMIUM LASER LITHOTRIPSY Right 01/13/2016   Procedure: URETEROSCOPY WITH HOLMIUM LASER LITHOTRIPSY;  Surgeon: Hollice Espy, MD;  Location: [facility] ORS;  Service:  Urology;  Laterality: Right;  [name] VASECTOMY     History        Home Medications    Prior to Admission medications   Medication Sig Start Date End Date Taking? Authorizing Provider  Amino Acids-Protein Hydrolys (FEEDING SUPPLEMENT, PRO-STAT SUGAR FREE 64,) LIQD Take 30 mLs by mouth 2 (two) times daily.   Yes [provider]  budesonide (RHINOCORT AQUA) 32 MCG/ACT nasal spray USE 1 PUFF IN EACH NOSTRIL EVERY DAY AS NEEDED Patient taking differently: USE 1 PUFF IN EACH NOSTRIL EVERY DAY AS NEEDED FOR CONGESTION 06/23/16  Yes [name]., MD  carbidopa-levodopa (SINEMET CR) 50-200 MG tablet Take 2 tablets by mouth 4 (four) times daily. Patient taking differently: Take 2 tablets by mouth 4 (four) times daily. 9am, 1pm, 5pm, 9pm 10/17/17  Yes Mody, Sital, MD  cholecalciferol (VITAMIN D) 1000 units tablet Take 1,000 Units by mouth daily.  05/19/12  Yes [provider]  enoxaparin (LOVENOX) 80 MG/0.8ML injection Inject 80 mg into the skin every 12 (twelve) hours.   Yes [provider]  erythromycin ophthalmic ointment Place 1 application into both eyes at bedtime.  12/31/15  Yes [provider]  gabapentin (NEURONTIN) 400 MG capsule Take 400 mg by mouth 3 (three) times daily. 9am, 1pm, 9pm 03/26/15  Yes [provider]  HYDROcodone-acetaminophen (NORCO/VICODIN) 5-325 MG tablet Take 1 tablet by mouth every 6 (six) hours as needed for moderate pain. Patient taking differently: Take 1-2 tablets by mouth every 4 (four) hours as needed (1 tablet (mild to moderate pain), 2 tablets (moderate to severe pain)).  10/17/17  Yes Mody, Ulice Bold, MD  Hypromellose (ARTIFICIAL TEARS OP) Place 1 drop into both eyes 3 (three) times daily as needed (dry eyes).   Yes [provider]  ketoconazole (NIZORAL) 2 % shampoo Apply 1 application topically See admin instructions. Wash beard and scalp with shampoo 3 times week (Monday, Wednesday, Friday) - let sit and soak,  then rinse   Yes [provider]  loratadine (CLARITIN) 10 MG tablet Take 10 mg by mouth daily. 05/19/12  Yes [provider]  Multiple Vitamin (MULTIVITAMIN WITH MINERALS) TABS tablet Take 1 tablet by mouth daily.   Yes [provider]  OVER THE COUNTER MEDICATION Take by mouth See admin instructions. Drink 4 oz (120 ml) prune juice by mouth daily   Yes [provider]  pramipexole (MIRAPEX) 0.5 MG tablet Take 2 tablets (1 mg total) by mouth at bedtime. 10/17/17  Yes Bettey Costa, MD  vitamin C (ASCORBIC ACID) 500 MG tablet Take 500 mg by mouth daily.   Yes [provider]  sulfamethoxazole-trimethoprim (BACTRIM DS,SEPTRA DS) 800-160 MG tablet Take 1 tablet by mouth 2 (two) times daily for 10 days. 11/12/17 11/22/17  Carmin Muskrat, MD    Family History Family History  Problem Relation Age of Onset  . Dementia Mother   . Cerebral aneurysm Father   . Bladder Cancer Paternal Uncle   . Liver  cancer Sister   . Prostate cancer Neg Hx   . Kidney cancer Neg Hx     Social History Social History   Tobacco Use  . Smoking status: Former Smoker    Types: Cigars    Last attempt to quit: 11/11/1968    Years since quitting: 49.0  . Smokeless tobacco: Former Systems developer    Types: Chew    Quit date: 11/12/2003  . Tobacco comment: Occassionally, also chewed tobacco till 2005. Quit smoking 1973  Substance Use Topics  . Alcohol use: No    Alcohol/week: 0.0 oz  . Drug use: No     Allergies   Patient has no known allergies.   Review of Systems Review of Systems  Constitutional:       Per HPI, otherwise negative  HENT:       Per HPI, otherwise negative  Respiratory:       Per HPI, otherwise negative  Cardiovascular:       Per HPI, otherwise negative  Gastrointestinal: Negative for vomiting.  Endocrine:       Negative aside from HPI  Genitourinary:       Neg aside from HPI   Musculoskeletal:       Per HPI, otherwise negative  Skin: Positive for color  change.  Neurological: Positive for tremors and weakness. Negative for syncope.     Physical Exam Updated Vital Signs BP 134/75 (BP Location: Left Arm)   Pulse 77   Temp 98 F (36.7 C) (Oral)   Resp 20   SpO2 97%   Physical Exam  Constitutional: He is oriented to person, place, and time. He has a sickly appearance.  HENT:  Head: Normocephalic and atraumatic.  Eyes: Conjunctivae and EOM are normal.  Cardiovascular: Normal rate and regular rhythm.  Pulmonary/Chest: Effort normal. No stridor. No respiratory distress.  Abdominal: He exhibits no distension. There is no tenderness.  Musculoskeletal: He exhibits no edema.       Right ankle: Normal.       Legs: Neurological: He is alert and oriented to person, place, and time. He displays atrophy and tremor. He displays no seizure activity.  Skin: Skin is warm and dry.  Psychiatric: He has a normal mood and affect.  Nursing note and vitals reviewed.    ED Treatments / Results  Labs (all labs ordered are listed, but only abnormal results are displayed) Labs Reviewed  COMPREHENSIVE METABOLIC PANEL - Abnormal; Notable for the following components:      Result Value   Chloride 97 (*)    Glucose, Bld 192 (*)    Calcium 8.5 (*)    Albumin 2.9 (*)    ALT 5 (*)    All other components within normal limits  CBC WITH DIFFERENTIAL/PLATELET - Abnormal; Notable for the following components:   RBC 3.81 (*)    Hemoglobin 11.8 (*)    HCT 36.4 (*)    All other components within normal limits    EKG None  Radiology Dg Knee Complete 4 Views Right  Result Date: 11/12/2017 CLINICAL DATA:  Right leg swelling for 2 days.  No known injury. EXAM: RIGHT KNEE - COMPLETE 4+ VIEW COMPARISON:  Two-view right knee radiographs 10/14/2017 and 04/02/2014 at [facility] FINDINGS: Right knee arthroplasty is again noted. Joint spacing is normal. Methylmethacrylate within the lateral soft tissues is again seen. Medial ossifications at  the level of the patella are chronic. Suprapatellar soft tissue swelling is noted medially. Atherosclerotic changes are again noted.  IMPRESSION: 1. Suprapatellar medial soft tissue swelling without underlying fracture. 2. 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 3. Extruded methylmethacrylate medial to the knee joint is stable. 4. Atherosclerosis. Electronically Signed   By: San Morelle M.D.   On: 11/12/2017 19:04    Procedures Procedures (including critical care time)  Medications Ordered in ED Medications  sulfamethoxazole-trimethoprim (BACTRIM DS,SEPTRA DS) 800-160 MG per tablet 1 tablet (has no administration in time range)  sodium chloride 0.9 % bolus 500 mL (0 mLs Intravenous Stopped 11/12/17 1912)     Initial Impression / Assessment and Plan / ED Course  I have reviewed the triage vital signs and the nursing notes.  Pertinent labs & imaging results that were available during my care of the patient were reviewed by me and considered in my medical decision making (see chart for details).  On repeat exam the patient is coming by his son. With family members we discussed all findings including reassuring labs, vitals, preserved distal neurovascular status, some suspicion for cutaneous infection, little evidence for septic arthritis given the patient's willingness to move the knee spontaneously, reassuring labs, fever absence. Patient will start Bactrim for cellulitis, follow-up closely with his orthopedist.   Final Clinical Impressions(s) / ED Diagnoses   Final diagnoses:  Cellulitis of right lower extremity    ED Discharge Orders        Ordered    sulfamethoxazole-trimethoprim (BACTRIM DS,SEPTRA DS) 800-160 MG tablet  2 times daily     11/12/17 2108       Carmin Muskrat, MD 11/12/17 2114

## 2017-11-12 NOTE — ED Notes (Signed)
Pt transported to xray 

## 2017-11-12 NOTE — Progress Notes (Signed)
Preliminary results by tech - Venous Duplex Right Lower Ext. Completed. Negative for deep and superficial vein thrombosis. Oda Cogan, BS, RDMS, RVT

## 2017-11-12 NOTE — ED Triage Notes (Signed)
Pt presents from [place] with [facility] for R leg swelling (knee and calf) x 2 days; pt denies falls or injuries; pt currently at [facility] since fall on Apr 2-3; hx of R knee replacement x 4 years ago, no other complications; strong pulses distally

## 2017-11-14 DIAGNOSIS — L03115 Cellulitis of right lower limb: Secondary | ICD-10-CM | POA: Diagnosis not present

## 2017-11-16 ENCOUNTER — Other Ambulatory Visit: Payer: Self-pay | Admitting: *Deleted

## 2017-11-16 DIAGNOSIS — L89622 Pressure ulcer of left heel, stage 2: Secondary | ICD-10-CM | POA: Diagnosis not present

## 2017-11-16 NOTE — Patient Outreach (Signed)
[facility]) Care Management  11/16/2017  Roger Rodriguez 1941/05/12 [id number]   CSW contacted pt today at [facility] SNF where he reports plans for dc home today. He was in the ER over the weekend with "cellulitis" and reports he his on antibiotics "but not getting better".  He reports his insurance denied his appeal and is no longer covering SNF.   CSW has left a message for SNF rep to call with dc plans/arrangements.    Pt indicates his children will be transporting and caring for him at home.   CSW will plan a follow up call to pt tomorrow and will make referral to [facility] for post SNF follow up.   Pt has Parkinson's, HTN and is s/p fall with hip injury, encephalopathy and the new onset cellulitis.   Plan f/u call to pt tomorrow at home and await update from SNF with dc plans.      Eduard Clos, MSW, [place] Worker  [place] [phone]

## 2017-11-17 ENCOUNTER — Other Ambulatory Visit: Payer: Self-pay | Admitting: *Deleted

## 2017-11-17 ENCOUNTER — Ambulatory Visit: Payer: PPO

## 2017-11-17 ENCOUNTER — Telehealth: Payer: Self-pay | Admitting: Emergency Medicine

## 2017-11-17 DIAGNOSIS — I129 Hypertensive chronic kidney disease with stage 1 through stage 4 chronic kidney disease, or unspecified chronic kidney disease: Secondary | ICD-10-CM | POA: Diagnosis not present

## 2017-11-17 DIAGNOSIS — M25561 Pain in right knee: Secondary | ICD-10-CM | POA: Diagnosis not present

## 2017-11-17 DIAGNOSIS — F329 Major depressive disorder, single episode, unspecified: Secondary | ICD-10-CM | POA: Diagnosis not present

## 2017-11-17 DIAGNOSIS — W19XXXD Unspecified fall, subsequent encounter: Secondary | ICD-10-CM | POA: Diagnosis not present

## 2017-11-17 DIAGNOSIS — E785 Hyperlipidemia, unspecified: Secondary | ICD-10-CM | POA: Diagnosis not present

## 2017-11-17 DIAGNOSIS — L89612 Pressure ulcer of right heel, stage 2: Secondary | ICD-10-CM | POA: Diagnosis not present

## 2017-11-17 DIAGNOSIS — S8391XD Sprain of unspecified site of right knee, subsequent encounter: Secondary | ICD-10-CM | POA: Diagnosis not present

## 2017-11-17 DIAGNOSIS — M25551 Pain in right hip: Secondary | ICD-10-CM | POA: Diagnosis not present

## 2017-11-17 DIAGNOSIS — Z85820 Personal history of malignant melanoma of skin: Secondary | ICD-10-CM | POA: Diagnosis not present

## 2017-11-17 DIAGNOSIS — G20 Parkinson's disease: Secondary | ICD-10-CM | POA: Diagnosis not present

## 2017-11-17 DIAGNOSIS — Z8639 Personal history of other endocrine, nutritional and metabolic disease: Secondary | ICD-10-CM | POA: Diagnosis not present

## 2017-11-17 DIAGNOSIS — Z8546 Personal history of malignant neoplasm of prostate: Secondary | ICD-10-CM | POA: Diagnosis not present

## 2017-11-17 DIAGNOSIS — M4807 Spinal stenosis, lumbosacral region: Secondary | ICD-10-CM | POA: Diagnosis not present

## 2017-11-17 DIAGNOSIS — N3281 Overactive bladder: Secondary | ICD-10-CM | POA: Diagnosis not present

## 2017-11-17 DIAGNOSIS — N189 Chronic kidney disease, unspecified: Secondary | ICD-10-CM | POA: Diagnosis not present

## 2017-11-17 DIAGNOSIS — Z79899 Other long term (current) drug therapy: Secondary | ICD-10-CM | POA: Diagnosis not present

## 2017-11-17 DIAGNOSIS — N200 Calculus of kidney: Secondary | ICD-10-CM | POA: Diagnosis not present

## 2017-11-17 NOTE — Addendum Note (Signed)
Addended by: Deirdre Peer on: 11/17/2017 04:45 PM   Modules accepted: Orders

## 2017-11-17 NOTE — Telephone Encounter (Signed)
CB # for [employer] [phone]

## 2017-11-17 NOTE — Telephone Encounter (Signed)
Sonja with [facility] calling wanting verbal orders for a Rosanna Randy pt that was just released from rehab after a fall. She wanted her orders for 2 times a week for 3 weeks and once a week for 6 weeks. Also he has a pressure ulcer on his left heel and the rehab center was treating this also. She also wants to know if he needs to continue his lisinopril. It was stopped along the way of his rehab/hospital stay. His Bp was 140/66 today and when released from rehab it was 155/90. He has taken it today per pt and his wife. Please advise for Dr. Rosanna Randy. Thanks.

## 2017-11-17 NOTE — Patient Outreach (Signed)
[facility]) Care Management  11/17/2017  Roger Rodriguez [birth year] [id number]    CSW spoke with pt who reports he is home; released from SNF.  "The Nurse is here now" he shared.  CSW has gotten the SNF DC Summary which indicates he will have PT, OT, RN visits from [facility] [place] [facility]. He also has left heel wound and SNF has instructed HH to assist with getting special boot.   CSW was also informed of plans for family to provide/arrange for 24 hour care at home.   CSW will call pt back later today or tomorrow to further assess his SNF dc transition and for further CSW needs.   Eduard Clos, MSW, [place] Worker  [place] [phone]

## 2017-11-18 ENCOUNTER — Other Ambulatory Visit: Payer: Self-pay | Admitting: *Deleted

## 2017-11-18 NOTE — Patient Outreach (Signed)
[facility]) Care Management  11/18/2017  KENNARD FILDES 05/29/1941 [id number]   CSW spoke with pt again today who is home with 24 hour care. Pt gave permission to speak with caregiver, [name], and his sons.  CSW spoke with [name] who indicates she is the lead for her team of private duty caregivers. They are providing 24 hour care and she has assessed since pt returned home that he needs a gait belt and possibly a hoyer lift. CSW advised them that once [facility] PT and OT come to the home and make recommendations, the pt's PCP can likely assist with hoyer lift order.  CSW has advised them of [facility] role and plans for [facility] RNCM to call for post SNF follow up and assessment.    CSW also spoke with son, Erline Levine, who feels home support and needs are in place; aside from gait belt and hoyer lift. Per son, [facility] RN is assisting with gait belt and I have encouraged him to ask PCP (visit tentative for next week) for order if [facility] is unable.  [facility] CSW will plan to close CSW referral at this time.  [facility] RNCM advised and to consult CSW if needs arise.    Eduard Clos, MSW, [place] Worker  [place] [phone]

## 2017-11-18 NOTE — Patient Outreach (Signed)
[facility]) Care Management  11/18/2017  Roger Rodriguez 1940/12/18 [id number]  Transition of care call  Referral received 5/7  Referral Reason : Discharge from [facility] on 5/7 Insurance: [employer].  Chart reviewed, PMH includes but not limited to , 77 year old male with recent inpatient admission to [facility] 4/4/2-4/7, discharged to [facility] for rehab, admission diagnosis. Dehydration, AKI, weakness, fall , pain right hip pain , no fracture. Right heel wound on discharge from [facility]  Hx: Parkinsons, Recent Right knee sprain, HTN  Placed call to patient explained purpose of the call,HIPAA verified.  Patient discussed prior to having recent fall but now his sons have arranged 24 hour caregiver at home.  Patient discussed home health RN visited on yesterday and plan return visit on tomorrow . Patient eager to find out when therapy will begin.  Placed call to [facility] and spoke representative Roger Rodriguez, states patient in scheduled for Monday but if opening over the weekend they will call him.  Patient reports his caregiver is now assisting with organizing his daily medications, he gave verbal agreement that I speak with Roger Rodriguez.  Roger Rodriguez states they are using a pill organizer, Patient was recently discharged from hospital and all medications have been reviewed.  Caregiver Roger Rodriguez reports she has spoken with home health RN on yesterday regarding a gait belt, and hopefully plan to to bring on tomorrows visit.  Patient has wound on right heel that has a dressing that she has been changing daily and home health RN the other days. Patient sleeps in regular bed at home,discussed keeping heel off the bed, she states they may have to get a hospital bed she will take  Patient states that he is taking Aleve as needed for pain control .Patient discussed he did not like supplement given in rehab, he is drinking something with 30 gm of protein .     Discussed transition of care program  with patient and follow up , he is agreeable to [facility] services and follow up calls including scheduling a home visit.   Plan  Patient will remain active in transition of care program, with weekly outreaches , will plan home visit in the next week.  Will send PCP barrier involvement letter .    [facility] CM Care Plan Problem One     Most Recent Value  Care Plan Problem One  Risk for readmission related to recent hospitalization for fall, weakness , dehydration ,   Role Documenting the Problem One  Care Management [place] for Problem One  Active  [facility] Long Term Goal   Patient will not experince hospital admission in the next 31 days   [facility] Long Term Goal Start Date  11/18/17  Interventions for Problem One Long Term Goal  Discussed current discharge, medication and pain control   [facility] CM Short Term Goal #1   Patient will report attending all medical appointment in the next 14 days  [facility] CM Short Term Goal #1 Start Date  11/18/17  Interventions for Short Term Goal #1  Reviewed upcoming appointments .   [facility] CM Short Term Goal #2   Patient will report improvement  in strength and balance  in the next 30 days   [facility] CM Short Term Goal #2 Start Date  11/18/17  Interventions for Short Term Goal #2  Discussed importance of nutrition eating balanced meals and continuing drinking nutrition supplements , reviewed rationale of nutrition in healing  Roger Draft, RN, [place] Management Coordinator  [phone]- Mobile [phone]- Toll Free Main Office

## 2017-11-18 NOTE — Telephone Encounter (Signed)
LMTCB  Pt next appt 11/29/17

## 2017-11-18 NOTE — Telephone Encounter (Signed)
Advised 

## 2017-11-18 NOTE — Telephone Encounter (Signed)
OK to give verbal orders for rehab.  They should continue dressing changes to heel as well.  He should continue lisinopril.  Does he have a hospital f/u appt scheduled?  Virginia Crews, MD, MPH [facility] 11/18/2017 8:56 AM

## 2017-11-19 ENCOUNTER — Encounter: Payer: Self-pay | Admitting: *Deleted

## 2017-11-19 ENCOUNTER — Telehealth: Payer: Self-pay | Admitting: Family Medicine

## 2017-11-19 NOTE — Telephone Encounter (Signed)
Requesting verbal order for PT for frequency of once this week (she saw pt today) and  starting next week twice a week for four weeks.  [place] [phone]

## 2017-11-22 ENCOUNTER — Encounter (INDEPENDENT_AMBULATORY_CARE_PROVIDER_SITE_OTHER): Payer: Self-pay | Admitting: Vascular Surgery

## 2017-11-22 ENCOUNTER — Ambulatory Visit (INDEPENDENT_AMBULATORY_CARE_PROVIDER_SITE_OTHER): Payer: PPO | Admitting: Vascular Surgery

## 2017-11-22 VITALS — BP 135/78 | HR 72 | Resp 15 | Ht 71.0 in | Wt 215.0 lb

## 2017-11-22 DIAGNOSIS — M47817 Spondylosis without myelopathy or radiculopathy, lumbosacral region: Secondary | ICD-10-CM | POA: Diagnosis not present

## 2017-11-22 DIAGNOSIS — M79605 Pain in left leg: Secondary | ICD-10-CM

## 2017-11-22 DIAGNOSIS — W19XXXD Unspecified fall, subsequent encounter: Secondary | ICD-10-CM | POA: Diagnosis not present

## 2017-11-22 DIAGNOSIS — I872 Venous insufficiency (chronic) (peripheral): Secondary | ICD-10-CM | POA: Diagnosis not present

## 2017-11-22 DIAGNOSIS — E785 Hyperlipidemia, unspecified: Secondary | ICD-10-CM | POA: Diagnosis not present

## 2017-11-22 DIAGNOSIS — N200 Calculus of kidney: Secondary | ICD-10-CM | POA: Diagnosis not present

## 2017-11-22 DIAGNOSIS — F329 Major depressive disorder, single episode, unspecified: Secondary | ICD-10-CM | POA: Diagnosis not present

## 2017-11-22 DIAGNOSIS — S8391XD Sprain of unspecified site of right knee, subsequent encounter: Secondary | ICD-10-CM | POA: Diagnosis not present

## 2017-11-22 DIAGNOSIS — L89612 Pressure ulcer of right heel, stage 2: Secondary | ICD-10-CM | POA: Diagnosis not present

## 2017-11-22 DIAGNOSIS — M79604 Pain in right leg: Secondary | ICD-10-CM

## 2017-11-22 DIAGNOSIS — G20 Parkinson's disease: Secondary | ICD-10-CM | POA: Diagnosis not present

## 2017-11-22 DIAGNOSIS — I10 Essential (primary) hypertension: Secondary | ICD-10-CM

## 2017-11-22 DIAGNOSIS — E7800 Pure hypercholesterolemia, unspecified: Secondary | ICD-10-CM | POA: Diagnosis not present

## 2017-11-22 NOTE — Telephone Encounter (Signed)
OK for verbal orders.  Virginia Crews, MD, MPH [facility] 11/22/2017 4:20 PM

## 2017-11-22 NOTE — Telephone Encounter (Signed)
Martha advised.

## 2017-11-22 NOTE — Progress Notes (Signed)
MRN : [id number]  Roger Rodriguez is a 77 y.o. (02-18-1941) male who presents with chief complaint of  Chief Complaint  Patient presents with  . New Patient (Initial Visit)    Foot discoloration  .  History of Present Illness: Patient is seen for evaluation of leg pain and leg swelling. The patient first noticed the swelling remotely. The swelling is associated with pain and discoloration. The pain and swelling worsens with prolonged dependency and improves with elevation. The pain is unrelated to activity.  The patient notes that in the morning the legs are significantly improved but they steadily worsened throughout the course of the day. The patient also notes a steady worsening of the discoloration in the ankle and shin area.   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  The patient denies symptoms consistent with rest pain.  The patient has an extensive history of DJD and LS spine disease.  The patient has no had any past angiography, interventions or vascular surgery.  Elevation makes the leg symptoms better, dependency makes them much worse. There is no history of ulcerations. The patient denies any recent changes in medications.  The patient has not been wearing graduated compression.  The patient denies a history of DVT or PE. There is no prior history of phlebitis. There is no history of primary lymphedema.  No history of malignancies. No history of trauma or groin or pelvic surgery. There is no history of radiation treatment to the groin or pelvis  The patient denies amaurosis fugax or recent TIA symptoms. There are no recent neurological changes noted. The patient denies recent episodes of angina or shortness of breath  No outpatient medications have been marked as taking for the 11/22/17 encounter (Office Visit) with Delana Meyer, Dolores Lory, MD.    Past Medical  History:  Diagnosis Date  . Allergy    allergic rhinitis  . Arthritis    OA  . Chronic kidney disease    stones  . Depression   . Family history of adverse reaction to anesthesia   . Hematuria   . History of kidney stones   . Hyperlipidemia   . Hypertension    no meds since parkinsons meds started  . Incontinence of urine   . Inguinal hernia   . Neuromuscular disorder ([place])    parkinsons  . Overactive bladder   . Parkinson's disease ([place])   . Prostate cancer ([place])   . Rhinitis, allergic   . Shortness of breath dyspnea    doe secondary to parkinsons    Past Surgical History:  Procedure Laterality Date  . BACK SURGERY  2014  . CYSTOSCOPY WITH STENT PLACEMENT Right 01/13/2016   Procedure: CYSTOSCOPY WITH STENT PLACEMENT;  Surgeon: Hollice Espy, MD;  Location: [facility] ORS;  Service: Urology;  Laterality: Right;  . CYSTOSCOPY/URETEROSCOPY/HOLMIUM LASER/STENT PLACEMENT Left 11/20/2015   Procedure: CYSTOSCOPY/URETEROSCOPY/HOLMIUM LASER/STENT PLACEMENT;  Surgeon: Hollice Espy, MD;  Location: [facility] OR[facility];  Service: Urology;  Laterality: Left;  . EYE SURGERY    . FRACTURE SURGERY      ankle   . H/O arthroscopy of right knee  2015  . H/O radical protatectomy    . HERNIA REPAIR    . JOINT REPLACEMENT     right knee  . LITHOTRIPSY  2010  . Rotator Cuff tear  1993   Repaired  . URETEROSCOPY WITH HOLMIUM LASER LITHOTRIPSY  2002?  . URETEROSCOPY WITH HOLMIUM LASER LITHOTRIPSY Right 01/13/2016   Procedure: URETEROSCOPY WITH HOLMIUM LASER LITHOTRIPSY;  Surgeon: Hollice Espy, MD;  Location: [facility] OR[facility];  Service: Urology;  Laterality: Right;  [name] VASECTOMY     History    Social History Social History   Tobacco Use  . Smoking status: Former Smoker    Types: Cigars    Last attempt to quit: 11/11/1968    Years since quitting: 49.0  . Smokeless tobacco: Former Systems developer    Types: Chew    Quit date: 11/12/2003  . Tobacco comment: Occassionally, also chewed tobacco till 2005. Quit smoking 1973    Substance Use Topics  . Alcohol use: No    Alcohol/week: 0.0 oz  . Drug use: No    Family History Family History  Problem Relation Age of Onset  . Dementia Mother   . Cerebral aneurysm Father   . Bladder Cancer Paternal Uncle   . Liver cancer Sister   . Prostate cancer Neg Hx   . Kidney cancer Neg Hx   No family history of bleeding/clotting disorders, porphyria or autoimmune disease   No Known Allergies   REVIEW OF SYSTEMS (Negative unless checked)  Constitutional: [] Weight loss  [] Fever  [] Chills Cardiac: [] Chest pain   [] Chest pressure   [] Palpitations   [] Shortness of breath when laying flat   [] Shortness of breath with exertion. Vascular:  [] Pain in legs with walking   [x] Pain in legs at rest  [] History of DVT   [] Phlebitis   [x] Swelling in legs   [x] Varicose veins   [] Non-healing ulcers Pulmonary:   [] Uses home oxygen   [] Productive cough   [] Hemoptysis   [] Wheeze  [] COPD   [] Asthma Neurologic:  [] Dizziness   [] Seizures   [] History of stroke   [] History of TIA  [] Aphasia   [] Vissual changes   [] Weakness or numbness in arm   [] Weakness or numbness in leg Musculoskeletal:   [] Joint swelling   [x] Joint pain   [x] Low back pain Hematologic:  [] Easy bruising  [] Easy bleeding   [] Hypercoagulable state   [] Anemic Gastrointestinal:  [] Diarrhea   [] Vomiting  [] Gastroesophageal reflux/heartburn   [] Difficulty swallowing. Genitourinary:  [] Chronic kidney disease   [] Difficult urination  [] Frequent urination   [] Blood in urine Skin:  [] Rashes   [] Ulcers  Psychological:  [] History of anxiety   []  History of major depression.  Physical Examination  Vitals:   11/22/17 1352  BP: 135/78  Pulse: 72  Resp: 15  Weight: 215 lb (97.5 kg)  Height: 5\' 11"  (1.803 m)   Body mass index is 29.99 kg/m. Gen: WD/WN, mild distress seen in a wheelchair Head: [place]/AT, No temporalis wasting.  Ear/Nose/Throat: Hearing grossly intact, nares w/o erythema or drainage, poor dentition Eyes: PER, EOMI,  sclera nonicteric.  Neck: Supple, no masses.  No bruit or JVD.  Pulmonary:  Good air movement, clear to auscultation bilaterally, no use of accessory muscles.  Cardiac: RRR, normal S1, S2, no Murmurs. Vascular: scattered varicosities present bilaterally.  Severe venous stasis changes to the legs bilaterally.  Severe 4+ soft pitting edema. Cool to touch  Vessel Right Left  Radial Palpable Palpable  PT Not Palpable Not Palpable  DP 1+ Palpable 1+ Palpable   Gastrointestinal: soft, non-distended. No guarding/no peritoneal signs.  Musculoskeletal: M/S 5/5 throughout.  No deformity or atrophy.  Neurologic: CN 2-12 intact. Pain and light touch intact in extremities.  Symmetrical.  Speech is fluent. Motor exam as listed above. Psychiatric: Judgment intact, Mood & affect appropriate  for pt's clinical situation. Dermatologic: venous rashes no ulcers noted.  No changes consistent with cellulitis. Lymph : No Cervical lymphadenopathy, no lichenification or skin changes of chronic lymphedema.  CBC Lab Results  Component Value Date   WBC 6.3 11/12/2017   HGB 11.8 (L) 11/12/2017   HCT 36.4 (L) 11/12/2017   MCV 95.5 11/12/2017   PLT 216 11/12/2017    BMET    Component Value Date/Time   NA 136 11/12/2017 1824   NA 141 02/23/2017 1605   NA 136 04/06/2014 0434   K 3.9 11/12/2017 1824   K 3.8 04/06/2014 0434   CL 97 (L) 11/12/2017 1824   CL 103 04/06/2014 0434   CO2 29 11/12/2017 1824   CO2 26 04/06/2014 0434   GLUCOSE 192 (H) 11/12/2017 1824   GLUCOSE 121 (H) 04/06/2014 0434   BUN 15 11/12/2017 1824   BUN 20 02/23/2017 1605   BUN 13 04/06/2014 0434   CREATININE 0.84 11/12/2017 1824   CREATININE 0.94 04/06/2014 0434   CALCIUM 8.5 (L) 11/12/2017 1824   CALCIUM 8.1 (L) 04/06/2014 0434   GFRNONAA >60 11/12/2017 1824   GFRNONAA >60 04/06/2014 0434   GFRNONAA 54 (L) 04/04/2014 0527   GFRAA >60 11/12/2017 1824   GFRAA >60 04/06/2014 0434   GFRAA >60 04/04/2014 0527   Estimated  Creatinine Clearance: 87.7 mL/min (by C-G formula based on SCr of 0.84 mg/dL).  COAG Lab Results  Component Value Date   INR 1.0 03/21/2014    Radiology Dg Knee Complete 4 Views Right  Result Date: 11/12/2017 CLINICAL DATA:  Right leg swelling for 2 days.  No known injury. EXAM: RIGHT KNEE - COMPLETE 4+ VIEW COMPARISON:  Two-view right knee radiographs 10/14/2017 and 04/02/2014 at [facility]. FINDINGS: Right knee arthroplasty is again noted. Joint spacing is normal. Methylmethacrylate within the lateral soft tissues is again seen. Medial ossifications at the level of the patella are chronic. Suprapatellar soft tissue swelling is noted medially. Atherosclerotic changes are again noted. IMPRESSION: 1. Suprapatellar medial soft tissue swelling without underlying fracture. 2. 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 3. Extruded methylmethacrylate medial to the knee joint is stable. 4. Atherosclerosis. Electronically Signed   By: San Morelle M.D.   On: 11/12/2017 19:04     Assessment/Plan 1. Chronic venous insufficiency No surgery or intervention at this point in time.    I have had a long discussion with the patient regarding venous insufficiency and why it  causes symptoms. I have discussed with the patient the chronic skin changes that accompany venous insufficiency and the long term sequela such as infection and ulceration.  Patient will begin wearing graduated compression stockings class 1 (20-30 mmHg) or compression wraps on a daily basis a prescription was given. The patient will put the stockings on first thing in the morning and removing them in the evening. The patient is instructed specifically not to sleep in the stockings.    In addition, behavioral modification including several periods of elevation of the lower extremities during the day will be continued. I have demonstrated that proper elevation is a position with the ankles at heart level.  The  patient is instructed to begin routine exercise, especially walking on a daily basis  Patient should undergo duplex ultrasound of the venous system to ensure that DVT or reflux is not present.  Following the review of the ultrasound the patient will follow up in 2-3 months to reassess the degree of swelling and the control that graduated  compression stockings or compression wraps  is offering.   The patient can be assessed for a Lymph Pump at that time - VAS [place] LOWER EXTREMITY VENOUS (DVT); Future  2. Pain in both lower extremities No surgery or intervention at this point in time.    I have had a long discussion with the patient regarding venous insufficiency and why it  causes symptoms. I have discussed with the patient the chronic skin changes that accompany venous insufficiency and the long term sequela such as infection and ulceration.  Patient will begin wearing graduated compression stockings or compression wraps on a daily basis a prescription was given. The patient will put the stockings on first thing in the morning and removing them in the evening. The patient is instructed specifically not to sleep in the stockings.    In addition, behavioral modification including several periods of elevation of the lower extremities during the day will be continued. I have demonstrated that proper elevation is a position with the ankles at heart level.  The patient is instructed to begin routine exercise, especially walking on a daily basis  Patient should undergo duplex ultrasound of the venous system to ensure that DVT or reflux is not present.  Following the review of the ultrasound the patient will follow up in 2-3 months to reassess the degree of swelling and the control that graduated compression stockings or compression wraps  is offering.   The patient can be assessed for a Lymph Pump at that time  3. Parkinson's disease ([place]) This is contributing significantly to his venous  insufficiency  4. Lumbar and sacral osteoarthritis This is contributing significantly to his venous insufficiency  5. Essential (primary) hypertension Continue antihypertensive medications as already ordered, these medications have been reviewed and there are no changes at this time.   6. Pure hypercholesterolemia Continue statin as ordered and reviewed, no changes at this time    Hortencia Pilar, MD  11/22/2017 9:52 PM

## 2017-11-22 NOTE — Telephone Encounter (Signed)
OK to give verbal orders 

## 2017-11-22 NOTE — Telephone Encounter (Signed)
Jana Half calling again about verbal orders. She was advised that Dr. Rosanna Randy is out of the office. Could you please review verbal orders? Thanks!

## 2017-11-23 ENCOUNTER — Encounter: Payer: Self-pay | Admitting: *Deleted

## 2017-11-23 ENCOUNTER — Other Ambulatory Visit: Payer: Self-pay | Admitting: *Deleted

## 2017-11-23 ENCOUNTER — Telehealth (INDEPENDENT_AMBULATORY_CARE_PROVIDER_SITE_OTHER): Payer: Self-pay

## 2017-11-23 ENCOUNTER — Telehealth: Payer: Self-pay | Admitting: Family Medicine

## 2017-11-23 DIAGNOSIS — S83421A Sprain of lateral collateral ligament of right knee, initial encounter: Secondary | ICD-10-CM | POA: Diagnosis not present

## 2017-11-23 DIAGNOSIS — Z0120 Encounter for dental examination and cleaning without abnormal findings: Secondary | ICD-10-CM | POA: Diagnosis not present

## 2017-11-23 NOTE — Patient Outreach (Signed)
[facility]) Care Management   11/23/2017  RASHEED WELTY 02-02-1941 [id number]  Roger Rodriguez is an 77 y.o. male  Subjective:   Patient discussed visit to vascular doctor on today patient discussed MD recommended he wear compression hose and his son has ordered them, he states MD recommended a special boot to wear on left leg and his son is going to the medical supply store to pick up item.   Patient discussed his caregiver has already changed dressing to left heel today.  Patient anticipates home health RN visit on today.  Patient discussed his course of Parkinson disease, patient reports using walker in ambulating from living room to bedroom.     Objective:  BP 124/70 (BP Location: Left Arm, Patient Position: Sitting, Cuff Size: Normal)   Pulse 78   Resp 18   Ht 1.803 m (5\' 11" )   Wt 215 lb (97.5 kg)   SpO2 98%   BMI 29.99 kg/m  Patient resting in recliner chair feet elevated.  Review of Systems  Constitutional: Negative.   HENT: Negative.   Eyes: Negative.   Respiratory: Negative.   Cardiovascular: Negative.   Gastrointestinal: Negative.   Genitourinary: Negative.   Musculoskeletal: Positive for joint pain.       Knee pain, brace to left knee   Skin: Negative.   Neurological: Negative.   Endo/Heme/Allergies: Negative.   Psychiatric/Behavioral: Negative.     Physical Exam  Constitutional: He is oriented to person, place, and time. He appears well-developed and well-nourished.  Cardiovascular: Normal rate and normal heart sounds.  Respiratory: Effort normal and breath sounds normal.  GI: Soft. Bowel sounds are normal.  Neurological: He is alert and oriented to person, place, and time.  Skin: Skin is warm and dry.  Swelling at right knee, dressing to left heel , patient wearing brace to left knee.   Psychiatric: He has a normal mood and affect. His behavior is normal. Judgment and thought content normal.    Encounter Medications:   Outpatient  Encounter Medications as of 11/23/2017  Medication Sig Note  . budesonide (RHINOCORT AQUA) 32 MCG/ACT nasal spray USE 1 PUFF IN EACH NOSTRIL EVERY DAY AS NEEDED (Patient taking differently: USE 1 PUFF IN EACH NOSTRIL EVERY DAY AS NEEDED FOR CONGESTION)   . Carbidopa-Levodopa ER (RYTARY) 23.75-95 MG CPCR Take 3 capsules by mouth 3 (three) times daily. Rytary   . celecoxib (CELEBREX) 200 MG capsule Take 200 mg by mouth 2 (two) times daily.   . cholecalciferol (VITAMIN D) 1000 units tablet Take 1,000 Units by mouth daily.    . Cyanocobalamin (VITAMIN B 12 PO) Take 1,000 mcg by mouth daily.   [name] erythromycin ophthalmic ointment Place 1 application into both eyes at bedtime.    . gabapentin (NEURONTIN) 400 MG capsule Take 400 mg by mouth 3 (three) times daily. 9am, 1pm, 9pm   . Hypromellose (ARTIFICIAL TEARS OP) Place 1 drop into both eyes 3 (three) times daily as needed (dry eyes).   [name] ketoconazole (NIZORAL) 2 % shampoo Apply 1 application topically See admin instructions. Wash beard and scalp with shampoo 3 times week (Monday, Wednesday, Friday) - let sit and soak, then rinse   . lisinopril (PRINIVIL,ZESTRIL) 2.5 MG tablet Take 2.5 mg by mouth daily.   [name] loratadine (CLARITIN) 10 MG tablet Take 10 mg by mouth daily.   . mirabegron ER (MYRBETRIQ) 25 MG TB24 tablet Take 25 mg by mouth daily. Takes one at noon   . Multiple Vitamin (  MULTIVITAMIN WITH MINERALS) TABS tablet Take 1 tablet by mouth daily.   . pramipexole (MIRAPEX) 0.5 MG tablet Take 2 tablets (1 mg total) by mouth at bedtime.   . vitamin C (ASCORBIC ACID) 500 MG tablet Take 500 mg by mouth daily.   . Amino Acids-Protein Hydrolys (FEEDING SUPPLEMENT, PRO-STAT SUGAR FREE 64,) LIQD Take 30 mLs by mouth 2 (two) times daily. 11/12/2017: Per MAR start date 10/18/17, stop date 12/02/17  . carbidopa-levodopa (SINEMET CR) 50-200 MG tablet Take 2 tablets by mouth 4 (four) times daily. (Patient not taking: Reported on 11/18/2017)   . enoxaparin (LOVENOX) 80  MG/0.8ML injection Inject 80 mg into the skin every 12 (twelve) hours. 11/12/2017: No stop date on MAR  . HYDROcodone-acetaminophen (NORCO/VICODIN) 5-325 MG tablet Take 1 tablet by mouth every 6 (six) hours as needed for moderate pain. (Patient not taking: Reported on 11/18/2017)   . OVER THE COUNTER MEDICATION Take by mouth See admin instructions. Drink 4 oz (120 ml) prune juice by mouth daily    No facility-administered encounter medications on file as of 11/23/2017.     Functional Status:   In your present state of health, do you have any difficulty performing the following activities: 11/23/2017 10/13/2017  Hearing? N N  Vision? Y N  Difficulty concentrating or making decisions? Tempie Donning  Walking or climbing stairs? Tempie Donning  Comment uses walker and standby assist  -  Dressing or bathing? Tempie Donning  Comment has personal care assistance available 24 hours  -  Doing errands, shopping? Y N  Comment has 24 hours assist  -  Conservation officer, nature and eating ? Y -  Using the Toilet? Y -  In the past six months, have you accidently leaked urine? Y -  Comment wears depends  -  Do you have problems with loss of bowel control? N -  Managing your Medications? Y -  Comment family/staff assistance  -  Managing your Finances? Y -  Housekeeping or managing your Housekeeping? Y -  Comment family/staff available -  Some recent data might be hidden    Fall/Depression Screening:    Fall Risk  11/18/2017 09/13/2017 10/22/2016  Falls in the past year? Yes Yes Yes  Number falls in past yr: 2 or more 2 or more 2 or more  Injury with Fall? Yes Yes No  Risk Factor Category  - High Fall Risk -  Risk for fall due to : History of fall(s) History of fall(s);Impaired balance/gait;Impaired mobility -  Follow up Falls evaluation completed Falls evaluation completed;Education provided;Falls prevention discussed Falls prevention discussed   PHQ 2/9 Scores 11/18/2017 04/15/2017 02/23/2017 10/22/2016 10/02/2015 10/02/2015 04/02/2015  PHQ - 2 Score 2 3  4 6 2 2  0  PHQ- 9 Score 11 14 15 17 6  - -    Assessment:  Initial home visit , patient has 24 caregiver, present during visit.    Weakness/High Fall Risk  Continuing with home health PT and OT .  Right knee pain- swelling decreasing per patient .  Left heel wound- continued wound care, taking measures to reduce pressure off  heel,   Continuing ongoing care management for transition of care.    Plan:  Provided and reviewed [facility] care management welcome packet.  Will continue weekly transition of care outreaches, Provided EMMI on preventing falls in the elderly.  Will send PCP visit note.   [facility] CM Care Plan Problem One     Most Recent Value  Care Plan Problem One  Risk for readmission related to recent hospitalization for fall, weakness , dehydration ,   Role Documenting the Problem One  Care Management [place] for Problem One  Active  [facility] Long Term Goal   Patient will not experince hospital admission in the next 31 days   [facility] Long Term Goal Start Date  11/18/17  Interventions for Problem One Long Term Goal  Home visit completed, reinforced taking medications, reviewed handout on fall prevention .   [facility] CM Short Term Goal #1   Patient will report attending all medical appointment in the next 14 days  [facility] CM Short Term Goal #1 Start Date  11/18/17  Interventions for Short Term Goal #1  Reviewed upcoming medical appointments.   [facility] CM Short Term Goal #2   Patient will report improvement  in strength and balance  in the next 30 days   [facility] CM Short Term Goal #2 Start Date  11/18/17  Interventions for Short Term Goal #2  Reinforced patient benefits of participating in recommended home health PT exercise recommendations   [facility] CM Short Term Goal #3  Patient will report left heel wound healing in the next 30 days   [facility] CM Short Term Goal #3 Start Date  11/23/17  Interventions for Short Tern Goal #3  Discussed with patient benefits of nutrition, healthy balanced diet, daily  protein at meals in healing. Reinforced keeping pressure off heel,        [name], RN, [place] Management Coordinator  [phone]- Mobile [phone]- [place]

## 2017-11-23 NOTE — Telephone Encounter (Signed)
Patient's son called and asked where he could purchase the "[place] Boot" that was suggested by Dr. Delana Meyer? He was at the local supply store, [facility] and they had not heard of it before.  I looked online and found that it was an actual thing, because [facility] thought that the spelling on the prescription may had been wrong.  The son then proceeded to ask where he could purchase this boot. I looked it up online, and I informed him that it seemed that it was only available online at [place] and or an online medical supply store.  He was pleased to get the information and clarification.

## 2017-11-23 NOTE — Telephone Encounter (Signed)
[place] Therapist with [facility] is requesting verbal orders as follows:  Home health occupational therapy for twice a week for 2 weeks and once a week for 1 week.   Please advise. Thanks TNP

## 2017-11-24 ENCOUNTER — Ambulatory Visit: Payer: PPO | Admitting: Family Medicine

## 2017-11-24 DIAGNOSIS — G20 Parkinson's disease: Secondary | ICD-10-CM | POA: Diagnosis not present

## 2017-11-24 DIAGNOSIS — L89612 Pressure ulcer of right heel, stage 2: Secondary | ICD-10-CM | POA: Diagnosis not present

## 2017-11-25 ENCOUNTER — Encounter: Payer: Self-pay | Admitting: *Deleted

## 2017-11-25 NOTE — Telephone Encounter (Signed)
ok 

## 2017-11-25 NOTE — Telephone Encounter (Signed)
Please review. Thanks!  

## 2017-11-25 NOTE — Telephone Encounter (Signed)
Advised  ED 

## 2017-11-26 DIAGNOSIS — G20 Parkinson's disease: Secondary | ICD-10-CM | POA: Diagnosis not present

## 2017-11-26 DIAGNOSIS — L89612 Pressure ulcer of right heel, stage 2: Secondary | ICD-10-CM | POA: Diagnosis not present

## 2017-11-29 ENCOUNTER — Ambulatory Visit (INDEPENDENT_AMBULATORY_CARE_PROVIDER_SITE_OTHER): Payer: PPO | Admitting: Family Medicine

## 2017-11-29 VITALS — BP 150/82 | HR 98 | Temp 97.8°F | Resp 18

## 2017-11-29 DIAGNOSIS — G20 Parkinson's disease: Secondary | ICD-10-CM

## 2017-11-29 DIAGNOSIS — C61 Malignant neoplasm of prostate: Secondary | ICD-10-CM | POA: Diagnosis not present

## 2017-11-29 DIAGNOSIS — I872 Venous insufficiency (chronic) (peripheral): Secondary | ICD-10-CM | POA: Diagnosis not present

## 2017-11-29 DIAGNOSIS — M170 Bilateral primary osteoarthritis of knee: Secondary | ICD-10-CM

## 2017-11-29 DIAGNOSIS — I10 Essential (primary) hypertension: Secondary | ICD-10-CM

## 2017-11-29 NOTE — Progress Notes (Signed)
Roger Rodriguez  MRN: [id number] DOB: 08/26/1940  Subjective:  HPI  The patient is a 77 year old male who presents for follow up after inpatient hospitalization and nursing home care.  The patient fell in February which resulted in him wearing a knee immobilizer.  When they took it off he was noted to have cellulitis of the right knee.   He spent 4 days in the hospital and 4 weeks at the nursing home.  He has been home now for 2 weeks.  He was seen by Dr Sabra Heck last  Tuesday and last Monday finished his antibiotics.  He also had his last Lovenox injection 1 week ago. He also has a sore on his left heel that home health has been treating.   Patient Active Problem List   Diagnosis Date Noted  . Chronic venous insufficiency 11/22/2017  . ARF (acute renal failure) ([place]) 10/12/2017  . RUQ pain 10/22/2016  . Depression, major, single episode, moderate ([place]) 10/22/2016  . Renal cyst, right 09/28/2016  . Hepatic steatosis 09/28/2016  . Parkinson's disease ([place]) 09/18/2015  . Restless leg 09/18/2015  . Malignant neoplasm of prostate ([place]) 05/22/2015  . Hepatitis A 04/02/2015  . Allergic rhinitis 04/02/2015  . Arthritis 04/02/2015  . Narrowing of intervertebral disc space 04/02/2015  . 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 Accumulation of fluid in tissues 04/02/2015  . Essential (primary) hypertension 04/02/2015  . Borderline diabetes 04/02/2015  . Hyperlipidemia 04/02/2015  . BP (high blood pressure) 04/02/2015  . Infected sebaceous cyst 04/02/2015  . Hernia, inguinal 04/02/2015  . Leg pain 04/02/2015  . Lumbar canal stenosis 04/02/2015  . 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 Malignant melanoma ([place]) 04/02/2015  . Arthritis, degenerative 04/02/2015  . Fatigue 04/02/2015  . Idiopathic Parkinson's disease ([place]) 04/02/2015  . Plantar fasciitis 04/02/2015  . CA of prostate ([place]) 04/02/2015  . Benign essential tremor 04/02/2015  . Lower urinary tract infection 04/02/2015   . Degeneration of intervertebral disc of lumbosacral region 05/10/2012    Past Medical History:  Diagnosis Date  . Allergy    allergic rhinitis  . Arthritis    OA  . Chronic kidney disease    stones  . Depression   . Family history of adverse reaction to anesthesia   . Hematuria   . History of kidney stones   . Hyperlipidemia   . Hypertension    no meds since parkinsons meds started  . Incontinence of urine   . Inguinal hernia   . Neuromuscular disorder ([place])    parkinsons  . Overactive bladder   . Parkinson's disease ([place])   . Prostate cancer ([place])   . Rhinitis, allergic   . Shortness of breath dyspnea    doe secondary to parkinsons    Social History   Socioeconomic History  . Marital status: Married    Spouse name: Not on file  . Number of children: Not on file  . Years of education: Not on file  . Highest education level: Not on file  Occupational History  . Not on file  Social Needs  . Financial resource strain: Not on file  . Food insecurity:    Worry: Not on file    Inability: Not on file  . Transportation needs:    Medical: Not on file    Non-medical: Not on file  Tobacco Use  . Smoking status: Former Smoker    Types: Cigars    Last attempt to quit: 11/11/1968  Years since quitting: 49.0  . Smokeless tobacco: Former Systems developer    Types: Chew    Quit date: 11/12/2003  . Tobacco comment: Occassionally, also chewed tobacco till 2005. Quit smoking 1973  Substance and Sexual Activity  . Alcohol use: No    Alcohol/week: 0.0 oz  . Drug use: No  . Sexual activity: Never  Lifestyle  . Physical activity:    Days per week: Not on file    Minutes per session: Not on file  . Stress: Not on file  Relationships  . Social connections:    Talks on phone: Not on file    Gets together: Not on file    Attends religious service: Not on file    Active member of club or organization: Not on file    Attends meetings of clubs or organizations: Not on file     Relationship status: Not on file  . Intimate partner violence:    Fear of current or ex partner: Not on file    Emotionally abused: Not on file    Physically abused: Not on file    Forced sexual activity: Not on file  Other Topics Concern  . Not on file  Social History Narrative  . Not on file    Outpatient Encounter Medications as of 11/29/2017  Medication Sig Note  . Amino Acids-Protein Hydrolys (FEEDING SUPPLEMENT, PRO-STAT SUGAR FREE 64,) LIQD Take 30 mLs by mouth 2 (two) times daily. 11/12/2017: Per MAR start date 10/18/17, stop date 12/02/17  . budesonide (RHINOCORT AQUA) 32 MCG/ACT nasal spray USE 1 PUFF IN EACH NOSTRIL EVERY DAY AS NEEDED (Patient taking differently: USE 1 PUFF IN EACH NOSTRIL EVERY DAY AS NEEDED FOR CONGESTION)   . celecoxib (CELEBREX) 200 MG capsule Take 200 mg by mouth 2 (two) times daily.   . cholecalciferol (VITAMIN D) 1000 units tablet Take 1,000 Units by mouth daily.    . Cyanocobalamin (VITAMIN B 12 PO) Take 1,000 mcg by mouth daily.   [name] erythromycin ophthalmic ointment Place 1 application into both eyes at bedtime.    . gabapentin (NEURONTIN) 400 MG capsule Take 400 mg by mouth 3 (three) times daily. 9am, 1pm, 9pm   . HYDROcodone-acetaminophen (NORCO/VICODIN) 5-325 MG tablet Take 1 tablet by mouth every 6 (six) hours as needed for moderate pain.   . Hypromellose (ARTIFICIAL TEARS OP) Place 1 drop into both eyes 3 (three) times daily as needed (dry eyes).   [name] ketoconazole (NIZORAL) 2 % shampoo Apply 1 application topically See admin instructions. Wash beard and scalp with shampoo 3 times week (Monday, Wednesday, Friday) - let sit and soak, then rinse   . lisinopril (PRINIVIL,ZESTRIL) 2.5 MG tablet Take 2.5 mg by mouth daily.   [name] loratadine (CLARITIN) 10 MG tablet Take 10 mg by mouth daily.   . mirabegron ER (MYRBETRIQ) 25 MG TB24 tablet Take 25 mg by mouth daily. Takes one at noon   . Multiple Vitamin (MULTIVITAMIN WITH MINERALS) TABS tablet Take 1 tablet by  mouth daily.   [name] OVER THE COUNTER MEDICATION Take by mouth See admin instructions. Drink 4 oz (120 ml) prune juice by mouth daily   . pramipexole (MIRAPEX) 0.125 MG tablet Take 0.125 mg by mouth 3 (three) times daily.   . pramipexole (MIRAPEX) 0.25 MG tablet Take 0.25 mg by mouth 3 (three) times daily.   . pramipexole (MIRAPEX) 0.5 MG tablet Take 2 tablets (1 mg total) by mouth at bedtime.   . vitamin C (ASCORBIC ACID) 500 MG  tablet Take 500 mg by mouth daily.   . [DISCONTINUED] carbidopa-levodopa (SINEMET CR) 50-200 MG tablet Take 2 tablets by mouth 4 (four) times daily. (Patient not taking: Reported on 11/18/2017)   . [DISCONTINUED] Carbidopa-Levodopa ER (RYTARY) 23.75-95 MG CPCR Take 3 capsules by mouth 3 (three) times daily. Rytary   . [DISCONTINUED] enoxaparin (LOVENOX) 80 MG/0.8ML injection Inject 80 mg into the skin every 12 (twelve) hours. 11/12/2017: No stop date on MAR   No facility-administered encounter medications on file as of 11/29/2017.     No Known Allergies  Review of Systems  Constitutional: Negative for fever.  HENT: Negative.   Eyes: Negative.   Respiratory: Negative for cough, shortness of breath and wheezing.   Cardiovascular: Negative for chest pain, palpitations and leg swelling.  Gastrointestinal: Negative.   Skin: Negative.   Neurological: Positive for tremors and weakness.  Endo/Heme/Allergies: Negative.   Psychiatric/Behavioral: Negative.     Objective:  BP (!) 150/82 (BP Location: Right Arm, Patient Position: Sitting, Cuff Size: Normal)   Pulse 98   Temp 97.8 F (36.6 C) (Oral)   Resp 18   Physical Exam  Constitutional: He is oriented to person, place, and time and well-developed, well-nourished, and in no distress.  HENT:  Head: Normocephalic and atraumatic.  Eyes: Conjunctivae are normal. No scleral icterus.  Neck: No thyromegaly present.  Cardiovascular: Normal rate, regular rhythm and normal heart sounds.  Pulmonary/Chest: Effort normal and breath  sounds normal.  Abdominal: Soft.  Neurological: He is alert and oriented to person, place, and time. Coordination abnormal. GCS score is 15.  Parkinsons stigmata  Skin: Skin is warm and dry. There is erythema.  Mild erythema around area of knee . No tenderness or drainage.Improving a lot.  Psychiatric: Mood, memory, affect and judgment normal.    Assessment and Plan :  Cellulitis Improving Continue Home health. Parkinsons Disease OA Pt has stopped Celebrex as instructed. H/o Melanoma  I have done the exam and reviewed the chart and it is accurate to the best of my knowledge. Development worker, community has been used and  any errors in dictation or transcription are unintentional. Miguel Aschoff M.D. [facility]

## 2017-11-30 ENCOUNTER — Ambulatory Visit: Payer: PPO | Admitting: Urology

## 2017-12-01 DIAGNOSIS — L89612 Pressure ulcer of right heel, stage 2: Secondary | ICD-10-CM | POA: Diagnosis not present

## 2017-12-01 DIAGNOSIS — G20 Parkinson's disease: Secondary | ICD-10-CM | POA: Diagnosis not present

## 2017-12-02 ENCOUNTER — Other Ambulatory Visit: Payer: Self-pay | Admitting: *Deleted

## 2017-12-02 ENCOUNTER — Telehealth: Payer: Self-pay | Admitting: Emergency Medicine

## 2017-12-02 NOTE — Patient Outreach (Signed)
[facility]) Care Management  12/02/2017  Roger Rodriguez February 01, 1941 [id number]   Transition of care call   Referral received 5/7  Referral Reason : Discharge from [place] [facility] on 5/7 Insurance: [employer].  Chart reviewed, PMH includes but not limited to , 77 year old male with recent inpatient admission to [facility] 4/4/2-4/7, discharged to [facility] for rehab, admission diagnosis. Dehydration, AKI, weakness, fall , pain right hip pain , no fracture. Right heel wound on discharge from [facility]  Hx: Parkinsons, Recent Right knee sprain, HTN   Successful outreach call to patient , HIPAA verified. Patient discussed he has contacted PCP office regarding orders to continue home health services states he would benefit from continued therapy visits as they have only been seeing him about 2 weeks.  Patient reports that he still has 24 personal care assistance , that assist with medication organization, dressing change to left heel on days home health RN not visiting.  Patient reports sleeping in bed at night using pillow to keep heels off the bed, and resting in chair during the day, with pressure off heel.  Patient discussed wearing compression hose to right leg, helping to control swelling .  Patient reports tolerating ambulation in home with walker and assistance.  Patient discussed that he has contacted insurance company regarding, coverage of last 3 days during his stay at [facility].   Plan  Will continue weekly transition of care outreaches, next call in a week discussed that he  receive call from coworker that will be providing coverage. [name], RN, [place] Management Coordinator  [phone]- Mobile [phone]- Toll Free Main Office

## 2017-12-02 NOTE — Telephone Encounter (Signed)
Pt called stating that he needed additional visited from his home health. He says they need new orders for this please advise. Thanks.

## 2017-12-02 NOTE — Telephone Encounter (Signed)
ok 

## 2017-12-02 NOTE — Telephone Encounter (Signed)
Advised that we have already authorized this order.

## 2017-12-09 ENCOUNTER — Ambulatory Visit: Payer: Self-pay | Admitting: *Deleted

## 2017-12-09 ENCOUNTER — Telehealth: Payer: Self-pay | Admitting: Family Medicine

## 2017-12-09 ENCOUNTER — Other Ambulatory Visit: Payer: Self-pay

## 2017-12-09 NOTE — Telephone Encounter (Signed)
Caryl Pina with [facility] called saying Mr Wotton is needing additions services with [facility] for OT therapy and home health.  Caryl Pina will be reaching out to [facility] and have them contact us for addition services for patient.   [place]

## 2017-12-09 NOTE — Patient Outreach (Signed)
Transition of care call: Placed call to patient while covering for assigned RN. Patient answered and reports that he is doing well. Reports improved wound healing and improved strength. Patient reports that dressing changed are being done with caregiver daily. Reports heel is almost healed. Reports no more home health at this time.   Denies any new problems or concerns today.  PLAN:will provide update to case manager. Patient will continue to get weekly outreach's for transition of care.  Tomasa Rand, RN, BSN, CEN [facility] [employer] [phone]

## 2017-12-10 ENCOUNTER — Telehealth: Payer: Self-pay | Admitting: Family Medicine

## 2017-12-10 NOTE — Telephone Encounter (Signed)
[name] with [facility] called saying there are 3 missed OT appts because insurance would not ok the visits and pt has has discharged from OT with [facility]

## 2017-12-10 NOTE — Telephone Encounter (Signed)
FYI

## 2017-12-10 NOTE — Telephone Encounter (Signed)
Please review. Thanks!  

## 2017-12-11 NOTE — Telephone Encounter (Signed)
Ok thanks 

## 2017-12-16 ENCOUNTER — Other Ambulatory Visit: Payer: Self-pay | Admitting: *Deleted

## 2017-12-16 ENCOUNTER — Ambulatory Visit: Payer: Self-pay

## 2017-12-16 NOTE — Patient Outreach (Signed)
[facility]) Care Management  12/16/2017  Roger Rodriguez 1941/01/18 [id number]  Referral received 5/7  Referral Reason : Discharge from [facility] on 5/7 Insurance: [employer]age.  Chart reviewed, PMH includes but not limited to , 77 year old male with recent inpatient admission to [facility] 4/4/2-4/7, discharged to [facility] for rehab, admission diagnosis. Dehydration, AKI, weakness, fall , pain right hip pain , no fracture. Right heel wound on discharge from [facility]  Hx: Parkinsons, Recent Right knee sprain, HTN  Successful outreach call to patient, HIPAA information verified.  Patient states that he is getting along slowly using his walker, she discussed home health services has ended as of 2 weeks ago, not approved to continue by insurance. Patient states he believes he would get a little stronger with continued therapy, he is using walker now and would like to get back to a cane, but states I may not be able to get back to that.   Patient also has left heel wound, reports that it is almost healed up last scab came off on today but he just wants to keep it covered. ,asking about getting supplies to continue wound care, he states currently personal care assistants are changing dressings daily. Patient reports that he is able to wear support hose on both legs now.   Patient asked this care manager to check on what happened with home health services.   Placed call to [facility] spoke with stephanie reports additional services have been denied by insurance plan  at this time. Placed call to [facility] spoke with Lianne Cure, regarding patient concerns, I  will discuss with patient his option to appeal that is included in letter of denial of service he may have received., patient states he does not plan to do that .    Patient states he will be be able to purchase supplies for dressing changes. He endorses personal care assistants are available to help with his daily care, mobility in  home and continue to assist with  exercises provided by home health, remarks he is paying for that service.     Plan  Patient declines follow up call in the next week for final transition of care call, explained reason states he doesn't feel it is necessary, offered  follow up home visit , agreeable to call in the next 2 weeks.    [name], RN, [place] Management Coordinator  [phone]- Mobile [phone]- Toll Free Main Office

## 2017-12-20 ENCOUNTER — Ambulatory Visit: Payer: No Typology Code available for payment source | Admitting: *Deleted

## 2017-12-22 DIAGNOSIS — S83421A Sprain of lateral collateral ligament of right knee, initial encounter: Secondary | ICD-10-CM | POA: Diagnosis not present

## 2017-12-22 DIAGNOSIS — S83421D Sprain of lateral collateral ligament of right knee, subsequent encounter: Secondary | ICD-10-CM | POA: Diagnosis not present

## 2017-12-27 DIAGNOSIS — F4321 Adjustment disorder with depressed mood: Secondary | ICD-10-CM | POA: Diagnosis not present

## 2017-12-27 DIAGNOSIS — G2581 Restless legs syndrome: Secondary | ICD-10-CM | POA: Diagnosis not present

## 2017-12-27 DIAGNOSIS — G20 Parkinson's disease: Secondary | ICD-10-CM | POA: Diagnosis not present

## 2017-12-27 DIAGNOSIS — G4752 REM sleep behavior disorder: Secondary | ICD-10-CM | POA: Diagnosis not present

## 2017-12-29 DIAGNOSIS — L700 Acne vulgaris: Secondary | ICD-10-CM | POA: Diagnosis not present

## 2017-12-29 DIAGNOSIS — Z85828 Personal history of other malignant neoplasm of skin: Secondary | ICD-10-CM | POA: Diagnosis not present

## 2017-12-29 DIAGNOSIS — Z1283 Encounter for screening for malignant neoplasm of skin: Secondary | ICD-10-CM | POA: Diagnosis not present

## 2017-12-29 DIAGNOSIS — D225 Melanocytic nevi of trunk: Secondary | ICD-10-CM | POA: Diagnosis not present

## 2017-12-29 DIAGNOSIS — L719 Rosacea, unspecified: Secondary | ICD-10-CM | POA: Diagnosis not present

## 2017-12-29 DIAGNOSIS — L578 Other skin changes due to chronic exposure to nonionizing radiation: Secondary | ICD-10-CM | POA: Diagnosis not present

## 2017-12-29 DIAGNOSIS — L821 Other seborrheic keratosis: Secondary | ICD-10-CM | POA: Diagnosis not present

## 2017-12-29 DIAGNOSIS — D229 Melanocytic nevi, unspecified: Secondary | ICD-10-CM | POA: Diagnosis not present

## 2017-12-29 DIAGNOSIS — L720 Epidermal cyst: Secondary | ICD-10-CM | POA: Diagnosis not present

## 2017-12-29 DIAGNOSIS — L570 Actinic keratosis: Secondary | ICD-10-CM | POA: Diagnosis not present

## 2017-12-29 DIAGNOSIS — D2230 Melanocytic nevi of unspecified part of face: Secondary | ICD-10-CM | POA: Diagnosis not present

## 2017-12-29 DIAGNOSIS — L812 Freckles: Secondary | ICD-10-CM | POA: Diagnosis not present

## 2017-12-30 ENCOUNTER — Other Ambulatory Visit: Payer: Self-pay | Admitting: *Deleted

## 2017-12-30 ENCOUNTER — Encounter: Payer: Self-pay | Admitting: *Deleted

## 2017-12-30 NOTE — Patient Outreach (Addendum)
[place] [facility]) Care Management  12/30/2017  GER RINGENBERG February 03, 1941 [id number]   Telephone follow up call   Referral received 5/7  Referral Reason : Discharge from [facility] on 5/7 Insurance: [employer]age.  Chart reviewed, PMH includes but not limited to , 77 year old male with recent inpatient admission to [facility] 4/4/2-4/7, discharged to [facility] for rehab, admission diagnosis. Dehydration, AKI, weakness, fall , pain right hip pain , no fracture. Left  heel wound on discharge from [facility]  Hx: Parkinsons, Recent Right knee sprain, HTN  Unsuccessful outreach call to patient, attempted calls to home and mobile number, no answer able to leave a HIPAA compliant message for return call.   Plan  Will plan return call in the next 4 business days and send unsuccessful outreach letter.    [name], RN, [place] Management Coordinator  [phone]- Mobile [phone]- Toll Free Main Office

## 2017-12-31 DIAGNOSIS — S8391XA Sprain of unspecified site of right knee, initial encounter: Secondary | ICD-10-CM | POA: Diagnosis not present

## 2017-12-31 DIAGNOSIS — M25561 Pain in right knee: Secondary | ICD-10-CM | POA: Diagnosis not present

## 2018-01-04 ENCOUNTER — Other Ambulatory Visit: Payer: Self-pay | Admitting: *Deleted

## 2018-01-04 NOTE — Patient Outreach (Signed)
[name]) Care Management  01/04/2018  Roger Rodriguez January 13, 1941 [id number]   Referral received 5/7  Referral Reason : Discharge from [facility] on 5/7 Insurance: [employer] Ad[employer]age.  Chart reviewed, PMH includes but not limited to , 77 year old male with recent inpatient admission to [facility] 4/4/2-4/7, discharged to [facility] for rehab, admission diagnosis. Dehydration, AKI, weakness, fall , pain right hip pain , no fracture. Left  heel wound on discharge from [facility]  Hx: Parkinsons, Recent Right knee sprain, HTN, Right total knee arthroplasty.   Successful outreach call to patient , HIPAA verified. Patient discussed his recent problems with right knee pain and follow up with orthopedic twice in last week, reports he was started on a steroid pack,recently, takes prn tramadol with some relief  Patient discussed MD ordered outpatient therapy at office, but he cancelled appointment on today due to right  knee discomfort.Patient discussed surgeons are not able to do surgery on knee  Patient reports he still has 24 hour hired Financial risk analyst, he reports healing at left heel wound as caregivers change dressings daily.  Patient reports tolerating short distance ambulation with his walker, no recent fall.   Patient discussed that he has received his last  free shipment of  medication for Parkinson's  Rytary and to purchase it at drug store will be over $600. Patient discussed that Rytary was working better for him than previous caradopa levodopa.and he would like to continue taking it if possible but the cost of too much.   Offered home visit for continued follow up patient declined stating that I could just call for now.   Plan  Will place pharmacy consult regarding patient concern for cost of Rytary. Will plan follow up call in the next 2 weeks. Encouraged patient follow up with MD regarding continued knee pain .     [name], RN, [place] Management Coordinator  [phone]- Mobile [phone]- Toll Free Main Office

## 2018-01-11 NOTE — Telephone Encounter (Signed)
Scheduled AWV for 02/02/18. -MM

## 2018-01-12 ENCOUNTER — Other Ambulatory Visit: Payer: Self-pay | Admitting: Pharmacist

## 2018-01-12 NOTE — Patient Outreach (Signed)
[place] [facility]) Care Management  01/12/2018  MYCAH FORMICA July 17, 1940 [id number]   77 y.o. year old male referred to [employer] for Medication Management (Initial pharmacy outreach) and Medication Assistance ([place])  Referred by Landis Martins, [place].   Was unable to reach patient via telephone today and have left HIPAA compliant voicemail asking patient to return my call. (unsuccessful outreach #1).  Plan: Will follow-up within 2-4  business days via telephone.  Will send unsuccessful outreach letter and allow patient 10 days to respond prior to closing case.  [name], PharmD Clinical Pharmacist, [facility] [phone]

## 2018-01-17 ENCOUNTER — Other Ambulatory Visit: Payer: Self-pay | Admitting: Pharmacist

## 2018-01-17 NOTE — Patient Outreach (Signed)
[place] [facility]) Care Management  01/17/2018  Roger Rodriguez 01/22/1941 [id number]  Patient returning my call this morning. HIPAA identifiers obtained x 2. Explained [facility] clinical pharmacy services, patient consented to services. Scheduled a home visit to review medications and begin the application process for Rytary ([employer]).   [place], [place], to see if she was able to switch her phone call with patient scheduled tomorrow to a co home visit.   [name], PharmD Clinical Pharmacist, [place] [facility] [phone]

## 2018-01-18 ENCOUNTER — Ambulatory Visit: Payer: No Typology Code available for payment source | Admitting: *Deleted

## 2018-01-18 ENCOUNTER — Telehealth: Payer: Self-pay | Admitting: Family Medicine

## 2018-01-18 ENCOUNTER — Other Ambulatory Visit: Payer: Self-pay | Admitting: Pharmacist

## 2018-01-18 ENCOUNTER — Other Ambulatory Visit: Payer: Self-pay | Admitting: *Deleted

## 2018-01-18 NOTE — Patient Outreach (Signed)
[facility]) Care Management  [place]   01/18/2018  Roger Rodriguez [birth year] [id number]  77 y.o. year old male referred to [facility] for Medication Management Referred by Landis Martins, [place] community, for medication assistance for Rytary.   PMH s/f: Parkinson's disease, HTN, tremor, chronic wound, edema, chronic pain, restless leg syndrome, HLD, depression  Patient with [employer] Medicare Advantage plan.   Patient confirms identity with HIPAA-identifiers x2 and gives verbal consent to speak about medications. Visit today conducted in the home, with Landis Martins, [place] and patient's home health nurse, [name], present.    Subjective:   Medication Adherence: To the best of my determination using fill history, patient is adherent to medications. Pill box check appears correct.  Medication Assistance:  Rytary = application filled out today; PAN foundation applied to today Will look into eligibility for Myrbetriq.  Has not reached donut hole.   Medication Management:  Patient has pill box he fills himself; pill bottles he keeps organized by prescription and OTC. Home health nurse [name] assists with management. Patient's prescription bottles in home appeared to match most recent AVS from primary care visit. A few discrepancies or duplicate entries were corrected in Epic during today's visit.   Objective:   Encounter Medications: Outpatient Encounter Medications as of 01/18/2018  Medication Sig Note  . amoxicillin-clavulanate (AUGMENTIN) 875-125 MG tablet Take 1 tablet by mouth. Prior to dental procedure 01/18/2018: As needed before dental procedures  . budesonide (RHINOCORT AQUA) 32 MCG/ACT nasal spray USE 1 PUFF IN EACH NOSTRIL EVERY DAY AS NEEDED (Patient taking differently: USE 1 PUFF IN EACH NOSTRIL EVERY DAY AS NEEDED FOR CONGESTION)   . cholecalciferol (VITAMIN D) 1000 units tablet Take 1,000 Units by mouth daily.    . Cyanocobalamin  (VITAMIN B 12 PO) Take 1,000 mcg by mouth daily.   [name] erythromycin ophthalmic ointment Place 1 application into both eyes at bedtime.  01/18/2018: Uses as needed when eye itches (about once per week)  . gabapentin (NEURONTIN) 400 MG capsule Take 400 mg by mouth 3 (three) times daily. 9am, 1pm, 9pm   . Hypromellose (ARTIFICIAL TEARS OP) Place 1 drop into both eyes 3 (three) times daily as needed (dry eyes).   [name] ketoconazole (NIZORAL) 2 % shampoo Apply 1 application topically See admin instructions. Wash beard and scalp with shampoo 3 times week (Monday, Wednesday, Friday) - let sit and soak, then rinse   . lisinopril (PRINIVIL,ZESTRIL) 2.5 MG tablet Take 2.5 mg by mouth 2 (two) times daily.    [name] loratadine (CLARITIN) 10 MG tablet Take 10 mg by mouth daily.   . Melatonin 3 MG TABS Take 1 tablet by mouth at bedtime.   . meloxicam (MOBIC) 15 MG tablet Take 1 tablet by mouth daily.   . mirabegron ER (MYRBETRIQ) 25 MG TB24 tablet Myrbetriq 25 mg tablet,extended release  TAKE 1 TABLET (25 MG TOTAL) 2 (TWO) TIMES DAILY BY MOUTH.   . naproxen sodium (ALEVE) 220 MG tablet Take 220 mg by mouth daily.   [name] OVER THE COUNTER MEDICATION Take by mouth See admin instructions. Drink 4 oz (120 ml) prune juice by mouth daily   . pramipexole (MIRAPEX) 0.5 MG tablet Take 2 tablets (1 mg total) by mouth at bedtime. (Patient taking differently: Take 0.5 mg by mouth 3 (three) times daily. Takes 1/2 tablet in the morning, 1/2 tablet at noon, and 1 tablet at night.)   . RYTARY 23.75-95 MG CPCR Take 3 capsules by  mouth 3 (three) times daily. 01/18/2018: Supposed to increase to QID but is continuing at TID dose currently  . traMADol (ULTRAM) 50 MG tablet Take 50 mg by mouth every 6 (six) hours as needed. for pain   . vitamin C (ASCORBIC ACID) 500 MG tablet Take 500 mg by mouth daily.   . vitamin E (VITAMIN E) 200 UNIT capsule Take 200 Units by mouth daily.   . [DISCONTINUED] Carbidopa-Levodopa ER (RYTARY) 23.75-95 MG CPCR Rytary  23.75 mg-95 mg capsule,extended release  TAKE 3 CAPSULES BY MOUTH 4 (FOUR) TIMES DAILY   . [DISCONTINUED] mirabegron ER (MYRBETRIQ) 25 MG TB24 tablet Take 25 mg by mouth 2 (two) times daily. Takes one at noon    . Amino Acids-Protein Hydrolys (FEEDING SUPPLEMENT, PRO-STAT SUGAR FREE 64,) LIQD Take 30 mLs by mouth 2 (two) times daily. 11/12/2017: Per MAR start date 10/18/17, stop date 12/02/17  . Multiple Vitamin (MULTIVITAMIN WITH MINERALS) TABS tablet Take 1 tablet by mouth daily.   . [DISCONTINUED] celecoxib (CELEBREX) 200 MG capsule Take 200 mg by mouth 2 (two) times daily.   . [DISCONTINUED] HYDROcodone-acetaminophen (NORCO/VICODIN) 5-325 MG tablet Take 1 tablet by mouth every 6 (six) hours as needed for moderate pain.   . [DISCONTINUED] pramipexole (MIRAPEX) 0.125 MG tablet Take 0.125 mg by mouth 3 (three) times daily.   . [DISCONTINUED] pramipexole (MIRAPEX) 0.25 MG tablet Take 0.25 mg by mouth 3 (three) times daily.    No facility-administered encounter medications on file as of 01/18/2018.     Functional Status: In your present state of health, do you have any difficulty performing the following activities: 11/23/2017 10/13/2017  Hearing? N N  Vision? Y N  Difficulty concentrating or making decisions? Tempie Donning  Walking or climbing stairs? Tempie Donning  Comment uses walker and standby assist  -  Dressing or bathing? Tempie Donning  Comment has personal care assistance available 24 hours  -  Doing errands, shopping? Y N  Comment has 24 hours assist  -  Conservation officer, nature and eating ? Y -  Using the Toilet? Y -  In the past six months, have you accidently leaked urine? Y -  Comment wears depends  -  Do you have problems with loss of bowel control? N -  Managing your Medications? Y -  Comment family/staff assistance  -  Managing your Finances? Y -  Housekeeping or managing your Housekeeping? Y -  Comment family/staff available -  Some recent data might be hidden    Fall/Depression Screening: Fall Risk  11/18/2017  09/13/2017 10/22/2016  Falls in the past year? Yes Yes Yes  Number falls in past yr: 2 or more 2 or more 2 or more  Injury with Fall? Yes Yes No  Risk Factor Category  - High Fall Risk -  Risk for fall due to : History of fall(s) History of fall(s);Impaired balance/gait;Impaired mobility -  Follow up Falls evaluation completed Falls evaluation completed;Education provided;Falls prevention discussed Falls prevention discussed   PHQ 2/9 Scores 11/18/2017 04/15/2017 02/23/2017 10/22/2016 10/02/2015 10/02/2015 04/02/2015  PHQ - 2 Score 2 3 4 6 2 2  0  PHQ- 9 Score 11 14 15 17 6  - -   Assessment:  Drugs sorted by system:  Neurologic/Psychologic: rytary, pramipexole, gabapentin  Cardiovascular: lisinopril  Pulmonary/Allergy: budesonide nasal spray, loratidine   Topical: ketoconazole shampoo  Pain: meloxicam, naproxen, tramadol, Vitamin C, Vitamin E  Vitamins/Minerals: chloecalciferol, cyancobalamin, melatonin, "Cholesterol Care" vitamin  Miscellaneous: mirbegron er   Duplications in therapy:  Meloxicam/naproxen:  takes melixicam in AM, naproxen in PM Medications to avoid in the elderly: meloxicam Drug interactions: none of clinical significance  Plan: 1. Updated medication list in Epic to reflect patient's home medications. Home meds appear to match patient's most recent AVS list from 12/27/17, with a few discrepancies: patient has not increased Rytary to QID (remains at TID) due to cost concerns.  2. Per chart review, Dr. Manuella Ghazi has already tried to apply to Rytary MAP ([place]) on behalf of patient and has been denied. Today, patient and I applied to the PAN foundation for [place]. Patient was approved for a grant amount of $4,200. Grant expires 01/18/2019. Card from [employer] will be mailed to the patient. I have a copy of approval. A second copy of approval was faxed to Dr. Manuella Ghazi.  3. Placed call to Dr. Trena Platt office updating him on [place]. I want to further be sure that patient has  been denied from [place] MAP and that I do not need to move forward with applying. Will await call back. Can provide Dr. Manuella Ghazi with the approval from [facility] foundation if they did not receive the fax.  4. Investigate patient's eligibility for Myrbetriq.  5. Follow up within 4 business days.   [name], PharmD Clinical Pharmacist, [facility] [phone]

## 2018-01-18 NOTE — Patient Outreach (Signed)
Roger Rodriguez   01/18/2018  Roger Rodriguez 31-Mar-1941 [id number]   Routine home visit   Roger Rodriguez is an 77 y.o. male    PMH includes but not limited to , 77 year old male with recent inpatient admission to [facility] 4/4/2-4/7, discharged to [facility] for rehab, admission diagnosis. Dehydration, AKI, weakness, fall , pain right hip pain , no fracture. Right heel wound on discharge from [facility]  Hx: Parkinson's, Recent Right knee sprain, HTN,history of right total knee arthroplasty.    Subjective:  Patient discussed his right knee discomfort is the thing that is bothering him the most . He shared that he is not able to tolerate recommended outpatient physical therapy due to right knee pain. Patient discussed swelling in knee is better on some days than others.  Patient reports that he has a follow up appointment for pain Rodriguez this month and for second opinion for treatment of right knee pain and swelling.   Objective: BP 132/80 (BP Location: Right Arm, Patient Position: Sitting, Cuff Size: Normal)   Pulse 96   Resp 18   SpO2 96%  Review of Systems  Constitutional: Negative.   HENT: Negative.   Eyes: Negative.   Respiratory: Negative.   Cardiovascular: Positive for leg swelling.       Swelling right leg and  knee  Gastrointestinal: Negative.   Genitourinary: Negative.   Musculoskeletal: Positive for joint pain.  Skin: Negative.   Neurological: Negative.   Endo/Heme/Allergies: Negative.   Psychiatric/Behavioral: Negative.     Physical Exam  Constitutional: He is oriented to person, place, and time. He appears well-developed and well-nourished.  Cardiovascular: Normal rate and normal heart sounds.  Respiratory: Effort normal and breath sounds normal.  GI: Soft. Bowel sounds are normal.  Neurological: He is alert and oriented to person, place, and time.  Skin: Skin is warm and dry.     Psychiatric: He has a normal mood and affect. His  behavior is normal. Judgment and thought content normal.    Encounter Medications:   Outpatient Encounter Medications as of 01/18/2018  Medication Sig Note  . Amino Acids-Protein Hydrolys (FEEDING SUPPLEMENT, PRO-STAT SUGAR FREE 64,) LIQD Take 30 mLs by mouth 2 (two) times daily. 11/12/2017: Per MAR start date 10/18/17, stop date 12/02/17  . budesonide (RHINOCORT AQUA) 32 MCG/ACT nasal spray USE 1 PUFF IN EACH NOSTRIL EVERY DAY AS NEEDED (Patient taking differently: USE 1 PUFF IN EACH NOSTRIL EVERY DAY AS NEEDED FOR CONGESTION)   . celecoxib (CELEBREX) 200 MG capsule Take 200 mg by mouth 2 (two) times daily.   . cholecalciferol (VITAMIN D) 1000 units tablet Take 1,000 Units by mouth daily.    . Cyanocobalamin (VITAMIN B 12 PO) Take 1,000 mcg by mouth daily.   [name] erythromycin ophthalmic ointment Place 1 application into both eyes at bedtime.    . gabapentin (NEURONTIN) 400 MG capsule Take 400 mg by mouth 3 (three) times daily. 9am, 1pm, 9pm   . HYDROcodone-acetaminophen (NORCO/VICODIN) 5-325 MG tablet Take 1 tablet by mouth every 6 (six) hours as needed for moderate pain.   . Hypromellose (ARTIFICIAL TEARS OP) Place 1 drop into both eyes 3 (three) times daily as needed (dry eyes).   [name] ketoconazole (NIZORAL) 2 % shampoo Apply 1 application topically See admin instructions. Wash beard and scalp with shampoo 3 times week (Monday, Wednesday, Friday) - let sit and soak, then rinse   . lisinopril (PRINIVIL,ZESTRIL) 2.5 MG tablet Take 2.5 mg by  mouth daily.   [name] loratadine (CLARITIN) 10 MG tablet Take 10 mg by mouth daily.   . mirabegron ER (MYRBETRIQ) 25 MG TB24 tablet Take 25 mg by mouth daily. Takes one at noon   . Multiple Vitamin (MULTIVITAMIN WITH MINERALS) TABS tablet Take 1 tablet by mouth daily.   [name] OVER THE COUNTER MEDICATION Take by mouth See admin instructions. Drink 4 oz (120 ml) prune juice by mouth daily   . pramipexole (MIRAPEX) 0.125 MG tablet Take 0.125 mg by mouth 3 (three) times daily.   .  pramipexole (MIRAPEX) 0.25 MG tablet Take 0.25 mg by mouth 3 (three) times daily.   . pramipexole (MIRAPEX) 0.5 MG tablet Take 2 tablets (1 mg total) by mouth at bedtime.   . vitamin C (ASCORBIC ACID) 500 MG tablet Take 500 mg by mouth daily.    No facility-administered encounter medications on file as of 01/18/2018.     Functional Status:   In your present state of health, do you have any difficulty performing the following activities: 11/23/2017 10/13/2017  Hearing? N N  Vision? Y N  Difficulty concentrating or making decisions? Tempie Donning  Walking or climbing stairs? Tempie Donning  Comment uses walker and standby assist  -  Dressing or bathing? Tempie Donning  Comment has personal care assistance available 24 hours  -  Doing errands, shopping? Y N  Comment has 24 hours assist  -  Conservation officer, nature and eating ? Y -  Using the Toilet? Y -  In the past six months, have you accidently leaked urine? Y -  Comment wears depends  -  Do you have problems with loss of bowel control? N -  Managing your Medications? Y -  Comment family/staff assistance  -  Managing your Finances? Y -  Housekeeping or managing your Housekeeping? Y -  Comment family/staff available -  Some recent data might be hidden    Fall/Depression Screening:    Fall Risk  11/18/2017 09/13/2017 10/22/2016  Falls in the past year? Yes Yes Yes  Number falls in past yr: 2 or more 2 or more 2 or more  Injury with Fall? Yes Yes No  Risk Factor Category  - High Fall Risk -  Risk for fall due to : History of fall(s) History of fall(s);Impaired balance/gait;Impaired mobility -  Follow up Falls evaluation completed Falls evaluation completed;Education provided;Falls prevention discussed Falls prevention discussed   PHQ 2/9 Scores 11/18/2017 04/15/2017 02/23/2017 10/22/2016 10/02/2015 10/02/2015 04/02/2015  PHQ - 2 Score '2 3 4 6 2 2 ' 0  PHQ- 9 Score '11 14 15 17 6 ' - -    Assessment:  Co visit with [name], Pharmacist . Patient has 24 hour personal care assistant  [name] present.   Right knee pain - for follow up with orthopedic pain Rodriguez in the next relief,receiving shorterm relief with tramadol , ice to right knee and elevation for swelling/pain Left heel wound - progressive healing, daily wound care, dressing change by personal care assistant.Keeping heel floated.  Fall - No recent falls, using walker to help with balance .  Parkinson's- continuing daily medications ,[facility] pharmacy consulted for medication cost assistance.    Plan:  Will send PCP visit note Will plan follow up call in the next 3  weeks.   [facility] CM Care Plan Problem One     Most Recent Value  Care Plan Problem One  Risk for readmission related to recent hospitalization for fall, weakness , dehydration ,   Role  Documenting the Problem One  Care Rodriguez Coordinator  Care Plan for Problem One  Active  [facility] Long Term Goal   Patient will not experince hospital admission in the next 90 days   [facility] Long Term Goal Start Date  11/18/17  [facility] CM Short Term Goal #1   Patient will report attending all medical appointment in the next 14 days  [facility] CM Short Term Goal #1 Start Date  11/18/17  [facility] CM Short Term Goal #1 Met Date  12/02/17  (Pended)   [facility] CM Short Term Goal #2   Patient will report improvement  in strength and balance  in the next 30 days   [facility] CM Short Term Goal #2 Start Date  01/04/18  (Pended)   Interventions for Short Term Goal #2  Discussed continuing ambulation as tolerated with walker and assistance   (Pended)       [name], RN, [place] Rodriguez Coordinator  [phone]- Mobile [phone]- [place]

## 2018-01-18 NOTE — Telephone Encounter (Signed)
Pt called into the office and I noticed that pt is scheduled for 2 AWV's July and August. Can you please look into this and see which appt needs to be canceled? Thanks TNP

## 2018-01-19 ENCOUNTER — Ambulatory Visit: Payer: No Typology Code available for payment source | Admitting: Pharmacist

## 2018-01-20 ENCOUNTER — Ambulatory Visit: Payer: PPO | Admitting: Family Medicine

## 2018-01-21 ENCOUNTER — Other Ambulatory Visit: Payer: No Typology Code available for payment source | Admitting: Pharmacist

## 2018-01-21 ENCOUNTER — Telehealth: Payer: Self-pay | Admitting: Urology

## 2018-01-21 NOTE — Patient Outreach (Signed)
[facility]) Care Management  01/21/2018  Roger Rodriguez [birth year] [id number]   Follow up regarding patient's eligibility for Myrbetriq MAP.   Call placed to [facility]) regarding eligibility requirements. For a household of one, patient must make below $31,225 annually. If a patient has Medicare insurance coverage, Myrbetriq must not be covered on the Part D formulary.   Patient has [employer] Medicare advantage plan. Myrbetriq is a Tier 3 medication.   Call placed to prescriber Dr. Zara Council regarding above information. Other urinary agents would not be a good option for the patient due to his Parkinson's and history of falls.   Placed call to patient to update him on our findings today. Patient identified self with HIPAA identifiers x 2. We discussed the above information. Patient verbalized understanding. I asked him to either expect a call from Dr. Rulon Sera office or otherwise I will follow up when I hear back from her within 2 weeks.   [name], PharmD Clinical Pharmacist, [facility] [phone]

## 2018-01-21 NOTE — Telephone Encounter (Signed)
Almyra Free with [facility] called and wanted to let you know that he has reached his medication gap on his mybretriq and she wanted to know if you wanted to switch it to something else? He can no longer afford it now and doesn't qualify for the assistant program. Please call her @ [phone]  Thanks, Sharyn Lull

## 2018-01-23 NOTE — Telephone Encounter (Signed)
Would he like to restart the PTNS therapy?

## 2018-01-24 ENCOUNTER — Ambulatory Visit: Payer: No Typology Code available for payment source | Admitting: Pharmacist

## 2018-01-24 DIAGNOSIS — H40003 Preglaucoma, unspecified, bilateral: Secondary | ICD-10-CM | POA: Diagnosis not present

## 2018-01-24 NOTE — Telephone Encounter (Signed)
Spoke with pt tried to get pt to cancel his AWV with [facility] on 02/02/18 and keep his AWV and CPE on 03/02/18. Pt stated that he wanted to keep both appts on 02/02/18 and his CPE with Dr. Rosanna Randy 03/02/18. Pt was advised that his appt with Dr. Rosanna Randy is just for his knee not his CPE or Follow up to AWV. Pt stated he understood. Per pt's request he is scheduled to for AWV with [facility] and Dr. Rosanna Randy for knee pain on 02/02/18 and his CPE/Follow-up to AWV with Dr. Rosanna Randy on 03/02/18. Thanks TNP

## 2018-01-24 NOTE — Telephone Encounter (Signed)
Spoke with Helene Kelp and decided to keep the AWV and CPE for 03/02/18 since apt on 02/02/18 (AWV) was scheduled with an OV. Helene Kelp to contact pt and cancel 7/24 awv. -MM

## 2018-01-26 NOTE — Telephone Encounter (Signed)
Spoke with patient he states that he just got out of rehab and does not have transportation yet to restart PTNS therapy. A month of Myrbetriq 25mg  was left up front to pick up. He will call back to restart PTNS when he is able

## 2018-01-31 DIAGNOSIS — M545 Low back pain: Secondary | ICD-10-CM | POA: Diagnosis not present

## 2018-01-31 DIAGNOSIS — Z79899 Other long term (current) drug therapy: Secondary | ICD-10-CM | POA: Diagnosis not present

## 2018-01-31 DIAGNOSIS — M170 Bilateral primary osteoarthritis of knee: Secondary | ICD-10-CM | POA: Diagnosis not present

## 2018-02-02 ENCOUNTER — Ambulatory Visit (INDEPENDENT_AMBULATORY_CARE_PROVIDER_SITE_OTHER): Payer: PPO

## 2018-02-02 ENCOUNTER — Ambulatory Visit: Payer: PPO | Admitting: Family Medicine

## 2018-02-02 ENCOUNTER — Ambulatory Visit (INDEPENDENT_AMBULATORY_CARE_PROVIDER_SITE_OTHER): Payer: PPO | Admitting: Family Medicine

## 2018-02-02 VITALS — BP 104/52 | HR 90 | Temp 99.0°F | Resp 16 | Ht 71.0 in | Wt 215.0 lb

## 2018-02-02 VITALS — BP 100/52 | HR 90 | Temp 99.0°F | Ht 71.0 in | Wt 215.6 lb

## 2018-02-02 DIAGNOSIS — R3915 Urgency of urination: Secondary | ICD-10-CM

## 2018-02-02 DIAGNOSIS — M25561 Pain in right knee: Secondary | ICD-10-CM | POA: Diagnosis not present

## 2018-02-02 DIAGNOSIS — Z0000 Encounter for general adult medical examination without abnormal findings: Secondary | ICD-10-CM | POA: Diagnosis not present

## 2018-02-02 DIAGNOSIS — E860 Dehydration: Secondary | ICD-10-CM

## 2018-02-02 DIAGNOSIS — C61 Malignant neoplasm of prostate: Secondary | ICD-10-CM | POA: Diagnosis not present

## 2018-02-02 DIAGNOSIS — G20 Parkinson's disease: Secondary | ICD-10-CM

## 2018-02-02 DIAGNOSIS — I10 Essential (primary) hypertension: Secondary | ICD-10-CM

## 2018-02-02 NOTE — Progress Notes (Signed)
Subjective:   Roger Rodriguez is a 77 y.o. male who presents for Medicare Annual/Subsequent preventive examination.  Review of Systems:  N/A  Cardiac Risk Factors include: advanced age (>76men, >32 women);male gender;obesity (BMI >30kg/m2);hypertension;dyslipidemia     Objective:    Vitals: BP (!) 104/52 (BP Location: Left Arm)   Pulse 90   Temp 99 F (37.2 C) (Oral)   Ht 5\' 11"  (1.803 m)   Wt 215 lb 9.6 oz (97.8 kg)   BMI 30.07 kg/m   Body mass index is 30.07 kg/m.  Advanced Directives 02/02/2018 10/13/2017 10/22/2016 11/12/2015 04/03/2015 04/02/2015  Does Patient Have a Medical Advance Directive? Yes Yes Yes Yes Yes Yes  Type of Paramedic of [place];Living will [place];Living will [place];Living will - [place];Living will [place];Living will  Does patient want to make changes to medical advance directive? - No - Patient declined - No - Patient declined - -  Copy of [place] in Chart? No - copy requested No - copy requested No - copy requested - No - copy requested No - copy requested    Tobacco Social History   Tobacco Use  Smoking Status Former Smoker  . Types: Cigars  . Last attempt to quit: 11/11/1968  . Years since quitting: 49.2  Smokeless Tobacco Former Systems developer  . Types: Chew  . Quit date: 11/12/2003  Tobacco Comment   Occassionally, also chewed tobacco till 2005. Quit smoking 1973     Counseling given: Not Answered Comment: Occassionally, also chewed tobacco till 2005. Quit smoking 1973   Clinical Intake:  Pre-visit preparation completed: Yes  Pain : 0-10 Pain Score: 7  Pain Type: Chronic pain Pain Location: Knee Pain Orientation: Right Pain Descriptors / Indicators: Aching Pain Frequency: Constant     Nutritional Status: BMI > 30  Obese Nutritional Risks: None Diabetes: No  How often do you need to have someone help you when  you read instructions, pamphlets, or other written materials from your doctor or pharmacy?: 1 - Never  Interpreter Needed?: No  Information entered by :: [facility], LPN  Past Medical History:  Diagnosis Date  . Allergy    allergic rhinitis  . Arthritis    OA  . Chronic kidney disease    stones  . Depression   . Family history of adverse reaction to anesthesia   . Hematuria   . History of kidney stones   . Hyperlipidemia   . Hypertension    no meds since parkinsons meds started  . Incontinence of urine   . Inguinal hernia   . Neuromuscular disorder ([place])    parkinsons  . Overactive bladder   . Parkinson's disease ([place])   . Prostate cancer ([place])   . Rhinitis, allergic   . Shortness of breath dyspnea    doe secondary to parkinsons   Past Surgical History:  Procedure Laterality Date  . BACK SURGERY  2014  . CYSTOSCOPY WITH STENT PLACEMENT Right 01/13/2016   Procedure: CYSTOSCOPY WITH STENT PLACEMENT;  Surgeon: Hollice Espy, MD;  Location: [facility] ORS;  Service: Urology;  Laterality: Right;  . CYSTOSCOPY/URETEROSCOPY/HOLMIUM LASER/STENT PLACEMENT Left 11/20/2015   Procedure: CYSTOSCOPY/URETEROSCOPY/HOLMIUM LASER/STENT PLACEMENT;  Surgeon: Hollice Espy, MD;  Location: [facility] ORS;  Service: Urology;  Laterality: Left;  . EYE SURGERY    . FRACTURE SURGERY      ankle   . H/O arthroscopy of right knee  2015  . H/O  radical protatectomy    . HERNIA REPAIR    . JOINT REPLACEMENT     right knee  . LITHOTRIPSY  2010  . Rotator Cuff tear  1993   Repaired  . URETEROSCOPY WITH HOLMIUM LASER LITHOTRIPSY  2002?  [name] URETEROSCOPY WITH HOLMIUM LASER LITHOTRIPSY Right 01/13/2016   Procedure: URETEROSCOPY WITH HOLMIUM LASER LITHOTRIPSY;  Surgeon: Hollice Espy, MD;  Location: [facility] ORS;  Service: Urology;  Laterality: Right;  [name] VASECTOMY     History   Family History  Problem Relation Age of Onset  . Dementia Mother   . Cerebral aneurysm Father   . Bladder Cancer Paternal Uncle   . Liver  cancer Sister   . Prostate cancer Neg Hx   . Kidney cancer Neg Hx    Social History   Socioeconomic History  . Marital status: Widowed    Spouse name: Not on file  . Number of children: 2  . Years of education: Not on file  . Highest education level: Some college, no degree  Occupational History  . Occupation: retired  Scientific laboratory technician  . Financial resource strain: Not hard at all  . Food insecurity:    Worry: Never true    Inability: Never true  . Transportation needs:    Medical: No    Non-medical: No  Tobacco Use  . Smoking status: Former Smoker    Types: Cigars    Last attempt to quit: 11/11/1968    Years since quitting: 49.2  . Smokeless tobacco: Former Systems developer    Types: Chew    Quit date: 11/12/2003  . Tobacco comment: Occassionally, also chewed tobacco till 2005. Quit smoking 1973  Substance and Sexual Activity  . Alcohol use: No    Alcohol/week: 0.0 oz  . Drug use: No  . Sexual activity: Never  Lifestyle  . Physical activity:    Days per week: Not on file    Minutes per session: Not on file  . Stress: To some extent  Relationships  . Social connections:    Talks on phone: Not on file    Gets together: Not on file    Attends religious service: Not on file    Active member of club or organization: Not on file    Attends meetings of clubs or organizations: Not on file    Relationship status: Not on file  Other Topics Concern  . Not on file  Social History Narrative  . Not on file    Outpatient Encounter Medications as of 02/02/2018  Medication Sig  . acetaminophen-codeine (TYLENOL #3) 300-30 MG tablet Take 1 tablet by mouth. Every 8 hours as needed  . budesonide (RHINOCORT AQUA) 32 MCG/ACT nasal spray USE 1 PUFF IN EACH NOSTRIL EVERY DAY AS NEEDED (Patient taking differently: USE 2 SPRAYS IN EACH NOSTRIL EVERY DAY AS NEEDED FOR CONGESTION)  . cholecalciferol (VITAMIN D) 1000 units tablet Take 1,000 Units by mouth daily.   . Cyanocobalamin (VITAMIN B 12 PO) Take  1,000 mcg by mouth daily.  [name] erythromycin ophthalmic ointment Place 1 application into both eyes at bedtime.   . gabapentin (NEURONTIN) 400 MG capsule Take 400 mg by mouth 3 (three) times daily. 9am, 1pm, 9pm  . Hypromellose (ARTIFICIAL TEARS OP) Place 1 drop into both eyes 3 (three) times daily as needed (dry eyes).  [name] ketoconazole (NIZORAL) 2 % shampoo Apply 1 application topically See admin instructions. Wash beard and scalp with shampoo 3 times week (Monday, Wednesday, Friday) - let sit and soak,  then rinse  . lisinopril (PRINIVIL,ZESTRIL) 2.5 MG tablet Take 2.5 mg by mouth 2 (two) times daily.   [name] loratadine (CLARITIN) 10 MG tablet Take 10 mg by mouth daily.  . Melatonin 3 MG TABS Take 1 tablet by mouth at bedtime.  . mirabegron ER (MYRBETRIQ) 25 MG TB24 tablet Myrbetriq 25 mg tablet,extended release  TAKE 1 TABLET (25 MG TOTAL) 2 (TWO) TIMES DAILY BY MOUTH.  . Multiple Vitamin (MULTIVITAMIN WITH MINERALS) TABS tablet Take 1 tablet by mouth daily.  . naproxen (NAPROSYN) 375 MG tablet Take 375 mg by mouth 2 (two) times daily.  [name] OVER THE COUNTER MEDICATION Take by mouth See admin instructions. Drink 4 oz (120 ml) prune juice by mouth daily  . pramipexole (MIRAPEX) 0.5 MG tablet Take 2 tablets (1 mg total) by mouth at bedtime. (Patient taking differently: Take 0.5 mg by mouth 3 (three) times daily. Takes 1/2 tablet in the morning and 1 tablet at night.)  . RYTARY 23.75-95 MG CPCR Take 3 capsules by mouth 3 (three) times daily.  . vitamin C (ASCORBIC ACID) 500 MG tablet Take 500 mg by mouth daily.  . vitamin E (VITAMIN E) 200 UNIT capsule Take 400 Units by mouth daily.   . Amino Acids-Protein Hydrolys (FEEDING SUPPLEMENT, PRO-STAT SUGAR FREE 64,) LIQD Take 30 mLs by mouth 2 (two) times daily.  [name] amoxicillin-clavulanate (AUGMENTIN) 875-125 MG tablet Take 1 tablet by mouth. Prior to dental procedure  . meloxicam (MOBIC) 15 MG tablet Take 1 tablet by mouth daily.  . naproxen sodium (ALEVE) 220 MG  tablet Take 220 mg by mouth daily.  . traMADol (ULTRAM) 50 MG tablet Take 50 mg by mouth every 6 (six) hours as needed. for pain   No facility-administered encounter medications on file as of 02/02/2018.     Activities of Daily Living In your present state of health, do you have any difficulty performing the following activities: 02/02/2018 11/23/2017  Hearing? N N  Vision? N Y  Difficulty concentrating or making decisions? N Y  Walking or climbing stairs? Y Y  Comment Due to knee pain. uses walker and standby assist   Dressing or bathing? Tempie Donning  Comment Has private care full time. has personal care assistance available 24 hours   Doing errands, shopping? Y Y  Comment Does not drive.  has 24 hours assist   Preparing Food and eating ? Tempie Donning  Comment Has assistance. -  Using the Toilet? N Y  In the past six months, have you accidently leaked urine? Tempie Donning  Comment Wears protection daily.  wears depends   Do you have problems with loss of bowel control? N N  Managing your Medications? N Y  Comment - family/staff assistance   Managing your Finances? N Y  Housekeeping or managing your Housekeeping? Tempie Donning  Comment Has assistance with cleaning.  family/staff available  Some recent data might be hidden    Patient Care Team: [name]., MD as PCP - General (Family Medicine) Hollice Espy, MD as Consulting Physician (Urology) Vladimir Crofts, MD as Consulting Physician (Neurology) Leandrew Koyanagi, MD as Referring Physician (Ophthalmology) Alfonzo Feller, RN as [place] Management [name] Kos, Blair Heys, [facility] as [place] Management (Pharmacist) [name], MD as Consulting Physician (Orthopedic Surgery) Lang Snow, NP as Nurse Practitioner (Neurology) Nadene Rubins, DO as Referring Physician (Physical Medicine and Rehabilitation) Schnier, Dolores Lory, MD as Consulting Physician (Vascular Surgery)   Assessment:  This is a  routine wellness examination for K[place].  Exercise Activities and Dietary recommendations Current Exercise Habits: The patient does not participate in regular exercise at present, Exercise limited by: orthopedic condition(s)  Goals    . DIET - REDUCE PORTION SIZE     Recommend monitoring portion sizes and decreasing portions by half and continuing to eat healthy snacks in between.        Fall Risk Fall Risk  02/02/2018 11/18/2017 09/13/2017 10/22/2016 10/02/2015  Falls in the past year? Yes Yes Yes Yes Yes  Number falls in past yr: 2 or more 2 or more 2 or more 2 or more 2 or more  Injury with Fall? Yes Yes Yes No No  Comment damaged artifical replacement in knee and had to have sx. - - - -  Risk Factor Category  - - High Fall Risk - -  Risk for fall due to : - History of fall(s) History of fall(s);Impaired balance/gait;Impaired mobility - Impaired balance/gait  Follow up Falls prevention discussed Falls evaluation completed Falls evaluation completed;Education provided;Falls prevention discussed Falls prevention discussed -   Is the patient's home free of loose throw rugs in walkways, pet beds, electrical cords, etc?   yes      Grab bars in the bathroom? yes      Handrails on the stairs?   no.      Adequate lighting?   yes  Timed Get Up and Go Performed: N/A  Depression Screen PHQ 2/9 Scores 02/02/2018 11/18/2017 04/15/2017 02/23/2017  PHQ - 2 Score 2 2 3 4   PHQ- 9 Score 12 11 14 15     Cognitive Function     6CIT Screen 02/02/2018 10/22/2016  What Year? 0 points 0 points  What month? 0 points 0 points  What time? 0 points 0 points  Count back from 20 0 points 0 points  Months in reverse 0 points 0 points  Repeat phrase 2 points 2 points  Total Score 2 2    Immunization History  Administered Date(s) Administered  . Influenza, High Dose Seasonal PF 04/02/2015, 04/02/2016, 04/15/2017  . Influenza,inj,Quad PF,6+ Mos 04/03/2014  . Pneumococcal Conjugate-13 10/22/2016  . Pneumococcal  Polysaccharide-23 04/06/2013, 04/05/2014  . Td 11/30/2006  . Zoster 04/06/2013    Qualifies for Shingles Vaccine? Due for Shingles vaccine. Declined my offer to administer today. Education has been provided regarding the importance of this vaccine. Pt has been advised to call her insurance company to determine her out of pocket expense. Advised she may also receive this vaccine at her local pharmacy or Health Dept. Verbalized acceptance and understanding.  Screening Tests Health Maintenance  Topic Date Due  . TETANUS/TDAP  11/29/2016  . INFLUENZA VACCINE  02/10/2018  . PNA vac Low Risk Adult  Completed   Cancer Screenings: Lung: Low Dose CT Chest recommended if Age 35-80 years, 30 pack-year currently smoking OR have quit w/in 15years. Patient does not qualify. Colorectal: N/A  Additional Screenings:  Hepatitis C Screening: N/A      Plan:  I have personally reviewed and addressed the Medicare Annual Wellness questionnaire and have noted the following in the patient's chart:  A. Medical and social history B. Use of alcohol, tobacco or illicit drugs  C. Current medications and supplements D. Functional ability and status E.  Nutritional status F.  Physical activity G. Advance directives H. List of other physicians I.  Hospitalizations, surgeries, and ER visits in previous 12 months J.  Vitals K. Screenings such as hearing  and vision if needed, cognitive and depression L. Referrals and appointments - none  In addition, I have reviewed and discussed with patient certain preventive protocols, quality metrics, and best practice recommendations. A written personalized care plan for preventive services as well as general preventive health recommendations were provided to patient.  See attached scanned questionnaire for additional information.   Signed,  Fabio Neighbors, LPN Nurse Health Advisor   Nurse Recommendations: Pt declined the tetanus vaccine today.

## 2018-02-02 NOTE — Patient Instructions (Addendum)
Roger Rodriguez , Thank you for taking time to come for your Medicare Wellness Visit. I appreciate your ongoing commitment to your health goals. Please review the following plan we discussed and let me know if I can assist you in the future.   Screening recommendations/referrals: Colonoscopy: N/A Recommended yearly ophthalmology/optometry visit for glaucoma screening and checkup Recommended yearly dental visit for hygiene and checkup  Vaccinations: Influenza vaccine: Up to date Pneumococcal vaccine: Up to date Tdap vaccine: Pt declines today.  Shingles vaccine: Pt declines today.     Advanced directives: Please bring a copy of your POA (Power of Attorney) and/or Living Will to your next appointment.   Conditions/risks identified: Fall risk prevention; Obesity- recommend monitoring portion sizes and decreasing portions by half and continuing to eat healthy snacks in between.   Next appointment: 11:20 AM today with Dr Roger Rodriguez. Pt declined scheduling the AWV for 2020.   Preventive Care 103 Years and Older, Male Preventive care refers to lifestyle choices and visits with your health care provider that can promote health and wellness. What does preventive care include?  A yearly physical exam. This is also called an annual well check.  Dental exams once or twice a year.  Routine eye exams. Ask your health care provider how often you should have your eyes checked.  Personal lifestyle choices, including:  Daily care of your teeth and gums.  Regular physical activity.  Eating a healthy diet.  Avoiding tobacco and drug use.  Limiting alcohol use.  Practicing safe sex.  Taking low doses of aspirin every day.  Taking vitamin and mineral supplements as recommended by your health care provider. What happens during an annual well check? The services and screenings done by your health care provider during your annual well check will depend on your age, overall health, lifestyle risk  factors, and family history of disease. Counseling  Your health care provider may ask you questions about your:  Alcohol use.  Tobacco use.  Drug use.  Emotional well-being.  Home and relationship well-being.  Sexual activity.  Eating habits.  History of falls.  Memory and ability to understand (cognition).  Work and work Statistician. Screening  You may have the following tests or measurements:  Height, weight, and BMI.  Blood pressure.  Lipid and cholesterol levels. These may be checked every 5 years, or more frequently if you are over 10 years old.  Skin check.  Lung cancer screening. You may have this screening every year starting at age 63 if you have a 30-pack-year history of smoking and currently smoke or have quit within the past 15 years.  Fecal occult blood test (FOBT) of the stool. You may have this test every year starting at age 25.  Flexible sigmoidoscopy or colonoscopy. You may have a sigmoidoscopy every 5 years or a colonoscopy every 10 years starting at age 53.  Prostate cancer screening. Recommendations will vary depending on your family history and other risks.  Hepatitis C blood test.  Hepatitis B blood test.  Sexually transmitted disease (STD) testing.  Diabetes screening. This is done by checking your blood sugar (glucose) after you have not eaten for a while (fasting). You may have this done every 1-3 years.  Abdominal aortic aneurysm (AAA) screening. You may need this if you are a current or former smoker.  Osteoporosis. You may be screened starting at age 60 if you are at high risk. Talk with your health care provider about your test results, treatment options, and if necessary,  the need for more tests. Vaccines  Your health care provider may recommend certain vaccines, such as:  Influenza vaccine. This is recommended every year.  Tetanus, diphtheria, and acellular pertussis (Tdap, Td) vaccine. You may need a Td booster every 10  years.  Zoster vaccine. You may need this after age 66.  Pneumococcal 13-valent conjugate (PCV13) vaccine. One dose is recommended after age 8.  Pneumococcal polysaccharide (PPSV23) vaccine. One dose is recommended after age 75. Talk to your health care provider about which screenings and vaccines you need and how often you need them. This information is not intended to replace advice given to you by your health care provider. Make sure you discuss any questions you have with your health care provider. Document Released: 07/26/2015 Document Revised: 03/18/2016 Document Reviewed: 04/30/2015 Elsevier Interactive Patient Education  2017 [place] Prevention in the Home Falls can cause injuries. They can happen to people of all ages. There are many things you can do to make your home safe and to help prevent falls. What can I do on the outside of my home?  Regularly fix the edges of walkways and driveways and fix any cracks.  Remove anything that might make you trip as you walk through a door, such as a raised step or threshold.  Trim any bushes or trees on the path to your home.  Use bright outdoor lighting.  Clear any walking paths of anything that might make someone trip, such as rocks or tools.  Regularly check to see if handrails are loose or broken. Make sure that both sides of any steps have handrails.  Any raised decks and porches should have guardrails on the edges.  Have any leaves, snow, or ice cleared regularly.  Use sand or salt on walking paths during winter.  Clean up any spills in your garage right away. This includes oil or grease spills. What can I do in the bathroom?  Use night lights.  Install grab bars by the toilet and in the tub and shower. Do not use towel bars as grab bars.  Use non-skid mats or decals in the tub or shower.  If you need to sit down in the shower, use a plastic, non-slip stool.  Keep the floor dry. Clean up any water that  spills on the floor as soon as it happens.  Remove soap buildup in the tub or shower regularly.  Attach bath mats securely with double-sided non-slip rug tape.  Do not have throw rugs and other things on the floor that can make you trip. What can I do in the bedroom?  Use night lights.  Make sure that you have a light by your bed that is easy to reach.  Do not use any sheets or blankets that are too big for your bed. They should not hang down onto the floor.  Have a firm chair that has side arms. You can use this for support while you get dressed.  Do not have throw rugs and other things on the floor that can make you trip. What can I do in the kitchen?  Clean up any spills right away.  Avoid walking on wet floors.  Keep items that you use a lot in easy-to-reach places.  If you need to reach something above you, use a strong step stool that has a grab bar.  Keep electrical cords out of the way.  Do not use floor polish or wax that makes floors slippery. If you must use  wax, use non-skid floor wax.  Do not have throw rugs and other things on the floor that can make you trip. What can I do with my stairs?  Do not leave any items on the stairs.  Make sure that there are handrails on both sides of the stairs and use them. Fix handrails that are broken or loose. Make sure that handrails are as long as the stairways.  Check any carpeting to make sure that it is firmly attached to the stairs. Fix any carpet that is loose or worn.  Avoid having throw rugs at the top or bottom of the stairs. If you do have throw rugs, attach them to the floor with carpet tape.  Make sure that you have a light switch at the top of the stairs and the bottom of the stairs. If you do not have them, ask someone to add them for you. What else can I do to help prevent falls?  Wear shoes that:  Do not have high heels.  Have rubber bottoms.  Are comfortable and fit you well.  Are closed at the  toe. Do not wear sandals.  If you use a stepladder:  Make sure that it is fully opened. Do not climb a closed stepladder.  Make sure that both sides of the stepladder are locked into place.  Ask someone to hold it for you, if possible.  Clearly mark and make sure that you can see:  Any grab bars or handrails.  First and last steps.  Where the edge of each step is.  Use tools that help you move around (mobility aids) if they are needed. These include:  Canes.  Walkers.  Scooters.  Crutches.  Turn on the lights when you go into a dark area. Replace any light bulbs as soon as they burn out.  Set up your furniture so you have a clear path. Avoid moving your furniture around.  If any of your floors are uneven, fix them.  If there are any pets around you, be aware of where they are.  Review your medicines with your doctor. Some medicines can make you feel dizzy. This can increase your chance of falling. Ask your doctor what other things that you can do to help prevent falls. This information is not intended to replace advice given to you by your health care provider. Make sure you discuss any questions you have with your health care provider. Document Released: 04/25/2009 Document Revised: 12/05/2015 Document Reviewed: 08/03/2014 Elsevier Interactive Patient Education  2017 [employer].

## 2018-02-02 NOTE — Progress Notes (Signed)
JAHNI NAZAR  MRN: [id number] DOB: 1940/12/20  Subjective:  HPI   The patient is a 77 year old male who presents for evaluation of knee pain.  The patient had right knee replacement in 2012.  He reports that his knee has been hurting since a fall in April.   He would like to have a referral for a second opinion.  [facility] is suggesting he have revision surgery of the knee.  He also reports having congestion for about a week.  He feels like it is in his throat.  He states that he would like to have a prescription for Tussinex as this has helped in the past.  Parkinsons diaease preogressive.No new issues. Pt using a wheelchair. Patient Active Problem List   Diagnosis Date Noted  . Chronic venous insufficiency 11/22/2017  . ARF (acute renal failure) ([place]) 10/12/2017  . RUQ pain 10/22/2016  . Depression, major, single episode, moderate ([place]) 10/22/2016  . Renal cyst, right 09/28/2016  . Hepatic steatosis 09/28/2016  . Parkinson's disease ([place]) 09/18/2015  . Restless leg 09/18/2015  . Malignant neoplasm of prostate ([place]) 05/22/2015  . Hepatitis A 04/02/2015  . Allergic rhinitis 04/02/2015  . Arthritis 04/02/2015  . Narrowing of intervertebral disc space 04/02/2015  . 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 Accumulation of fluid in tissues 04/02/2015  . Essential (primary) hypertension 04/02/2015  . Borderline diabetes 04/02/2015  . Hyperlipidemia 04/02/2015  . BP (high blood pressure) 04/02/2015  . Infected sebaceous cyst 04/02/2015  . Hernia, inguinal 04/02/2015  . Leg pain 04/02/2015  . Lumbar canal stenosis 04/02/2015  . 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 Malignant melanoma ([place]) 04/02/2015  . Arthritis, degenerative 04/02/2015  . Fatigue 04/02/2015  . Idiopathic Parkinson's disease ([place]) 04/02/2015  . Plantar fasciitis 04/02/2015  . CA of prostate ([place]) 04/02/2015  . Benign essential tremor 04/02/2015  . Lower urinary tract infection 04/02/2015  .  Degeneration of intervertebral disc of lumbosacral region 05/10/2012    Past Medical History:  Diagnosis Date  . Allergy    allergic rhinitis  . Arthritis    OA  . Chronic kidney disease    stones  . Depression   . Family history of adverse reaction to anesthesia   . Hematuria   . History of kidney stones   . Hyperlipidemia   . Hypertension    no meds since parkinsons meds started  . Incontinence of urine   . Inguinal hernia   . Neuromuscular disorder ([place])    parkinsons  . Overactive bladder   . Parkinson's disease ([employer])   . Prostate cancer ([place])   . Rhinitis, allergic   . Shortness of breath dyspnea    doe secondary to parkinsons    Social History   Socioeconomic History  . Marital status: Widowed    Spouse name: Not on file  . Number of children: 2  . Years of education: Not on file  . Highest education level: Some college, no degree  Occupational History  . Occupation: retired  Scientific laboratory technician  . Financial resource strain: Not hard at all  . Food insecurity:    Worry: Never true    Inability: Never true  . Transportation needs:    Medical: No    Non-medical: No  Tobacco Use  . Smoking status: Former Smoker    Types: Cigars    Last attempt to quit: 11/11/1968    Years since quitting: 49.2  . Smokeless tobacco: Former Systems developer  Types: Sarina Ser    Quit date: 11/12/2003  . Tobacco comment: Occassionally, also chewed tobacco till 2005. Quit smoking 1973  Substance and Sexual Activity  . Alcohol use: No    Alcohol/week: 0.0 oz  . Drug use: No  . Sexual activity: Never  Lifestyle  . Physical activity:    Days per week: Not on file    Minutes per session: Not on file  . Stress: To some extent  Relationships  . Social connections:    Talks on phone: Not on file    Gets together: Not on file    Attends religious service: Not on file    Active member of club or organization: Not on file    Attends meetings of clubs or organizations: Not on file    Relationship  status: Not on file  . Intimate partner violence:    Fear of current or ex partner: Not on file    Emotionally abused: Not on file    Physically abused: Not on file    Forced sexual activity: Not on file  Other Topics Concern  . Not on file  Social History Narrative  . Not on file    Outpatient Encounter Medications as of 02/02/2018  Medication Sig Note  . acetaminophen-codeine (TYLENOL #3) 300-30 MG tablet Take 1 tablet by mouth. Every 8 hours as needed   . Amino Acids-Protein Hydrolys (FEEDING SUPPLEMENT, PRO-STAT SUGAR FREE 64,) LIQD Take 30 mLs by mouth 2 (two) times daily. 11/12/2017: Per MAR start date 10/18/17, stop date 12/02/17  . amoxicillin-clavulanate (AUGMENTIN) 875-125 MG tablet Take 1 tablet by mouth. Prior to dental procedure 01/18/2018: As needed before dental procedures  . budesonide (RHINOCORT AQUA) 32 MCG/ACT nasal spray USE 1 PUFF IN EACH NOSTRIL EVERY DAY AS NEEDED (Patient taking differently: USE 2 SPRAYS IN EACH NOSTRIL EVERY DAY AS NEEDED FOR CONGESTION)   . cholecalciferol (VITAMIN D) 1000 units tablet Take 1,000 Units by mouth daily.    . Cyanocobalamin (VITAMIN B 12 PO) Take 1,000 mcg by mouth daily.   [name] erythromycin ophthalmic ointment Place 1 application into both eyes at bedtime.  01/18/2018: Uses as needed when eye itches (about once per week)  . gabapentin (NEURONTIN) 400 MG capsule Take 400 mg by mouth 3 (three) times daily. 9am, 1pm, 9pm   . Hypromellose (ARTIFICIAL TEARS OP) Place 1 drop into both eyes 3 (three) times daily as needed (dry eyes).   [name] ketoconazole (NIZORAL) 2 % shampoo Apply 1 application topically See admin instructions. Wash beard and scalp with shampoo 3 times week (Monday, Wednesday, Friday) - let sit and soak, then rinse   . lisinopril (PRINIVIL,ZESTRIL) 2.5 MG tablet Take 2.5 mg by mouth 2 (two) times daily.    [name] loratadine (CLARITIN) 10 MG tablet Take 10 mg by mouth daily.   . Melatonin 3 MG TABS Take 1 tablet by mouth at bedtime.   .  meloxicam (MOBIC) 15 MG tablet Take 1 tablet by mouth daily.   . mirabegron ER (MYRBETRIQ) 25 MG TB24 tablet Myrbetriq 25 mg tablet,extended release  TAKE 1 TABLET (25 MG TOTAL) 2 (TWO) TIMES DAILY BY MOUTH.   . Multiple Vitamin (MULTIVITAMIN WITH MINERALS) TABS tablet Take 1 tablet by mouth daily.   . naproxen (NAPROSYN) 375 MG tablet Take 375 mg by mouth 2 (two) times daily.   . naproxen sodium (ALEVE) 220 MG tablet Take 220 mg by mouth daily.   [name] OVER THE COUNTER MEDICATION Take by mouth See admin instructions.  Drink 4 oz (120 ml) prune juice by mouth daily   . pramipexole (MIRAPEX) 0.5 MG tablet Take 2 tablets (1 mg total) by mouth at bedtime. (Patient taking differently: Take 0.5 mg by mouth 3 (three) times daily. Takes 1/2 tablet in the morning and 1 tablet at night.)   . RYTARY 23.75-95 MG CPCR Take 3 capsules by mouth 3 (three) times daily. 01/18/2018: Supposed to increase to QID but is continuing at TID dose currently  . traMADol (ULTRAM) 50 MG tablet Take 50 mg by mouth every 6 (six) hours as needed. for pain   . vitamin C (ASCORBIC ACID) 500 MG tablet Take 500 mg by mouth daily.   . vitamin E (VITAMIN E) 200 UNIT capsule Take 400 Units by mouth daily.     No facility-administered encounter medications on file as of 02/02/2018.     No Known Allergies  Review of Systems  Constitutional: Positive for malaise/fatigue. Negative for fever.  HENT: Positive for congestion. Negative for ear discharge, ear pain, hearing loss, sinus pain, sore throat and tinnitus.   Eyes: Negative.   Respiratory: Positive for cough and sputum production. Negative for shortness of breath and wheezing.   Cardiovascular: Negative for chest pain, palpitations, orthopnea and leg swelling.  Gastrointestinal: Negative.   Genitourinary:       Pt says his uring smells strong.  Skin: Negative.   Neurological: Positive for weakness.  Endo/Heme/Allergies: Negative.   Psychiatric/Behavioral: Negative.     Objective:    BP (!) 104/52   Pulse 90   Temp 99 F (37.2 C) (Oral)   Resp 16   Ht 5\' 11"  (1.803 m)   Wt 215 lb (97.5 kg)   BMI 29.99 kg/m   Physical Exam  Constitutional: He is oriented to person, place, and time and well-developed, well-nourished, and in no distress.  HENT:  Head: Normocephalic and atraumatic.  Right Ear: External ear normal.  Left Ear: External ear normal.  Nose: Nose normal.  Cardiovascular: Normal rate, regular rhythm and normal heart sounds.  Pulmonary/Chest: No respiratory distress.  Abdominal: Soft.  Neurological: He is alert and oriented to person, place, and time. Gait normal. GCS score is 15.  Skin: Skin is warm and dry.  Psychiatric: Mood, memory, affect and judgment normal.    Assessment and Plan :  1. Right knee pain, unspecified chronicity seco nd opinion for TKR revision. - Ambulatory referral to Orthopedic Surgery  2. Urinary urgency Dehydration likely. Push fluids. - Urine Culture; Future - Urinalysis, Routine w reflex microscopic - Urine Culture 3.Progressive Parkinsons Disease  4.HTN  5.Recurrent cough Not consistent. Refill Tussionrex times one. May need workup. 6.Dehydration Push fluids and follow BP.  I have done the exam and reviewed the chart and it is accurate to the best of my knowledge. Development worker, community has been used and  any errors in dictation or transcription are unintentional. Miguel Aschoff M.D. [facility]

## 2018-02-03 ENCOUNTER — Telehealth: Payer: Self-pay | Admitting: Family Medicine

## 2018-02-03 DIAGNOSIS — G4752 REM sleep behavior disorder: Secondary | ICD-10-CM | POA: Diagnosis not present

## 2018-02-03 DIAGNOSIS — G903 Multi-system degeneration of the autonomic nervous system: Secondary | ICD-10-CM | POA: Diagnosis not present

## 2018-02-03 DIAGNOSIS — F4321 Adjustment disorder with depressed mood: Secondary | ICD-10-CM | POA: Diagnosis not present

## 2018-02-03 DIAGNOSIS — R413 Other amnesia: Secondary | ICD-10-CM | POA: Diagnosis not present

## 2018-02-03 DIAGNOSIS — G20 Parkinson's disease: Secondary | ICD-10-CM | POA: Diagnosis not present

## 2018-02-03 DIAGNOSIS — G2581 Restless legs syndrome: Secondary | ICD-10-CM | POA: Diagnosis not present

## 2018-02-03 LAB — MICROSCOPIC EXAMINATION: Casts: NONE SEEN /lpf

## 2018-02-03 LAB — URINALYSIS, ROUTINE W REFLEX MICROSCOPIC
Bilirubin, UA: NEGATIVE
Glucose, UA: NEGATIVE
Nitrite, UA: NEGATIVE
RBC, UA: NEGATIVE
Specific Gravity, UA: >=1.03 — AB (ref 1.005–1.030)
Urobilinogen, Ur: 1 mg/dL (ref 0.2–1.0)
pH, UA: 5 (ref 5.0–7.5)

## 2018-02-03 MED ORDER — HYDROCOD POLST-CPM POLST ER 10-8 MG/5ML PO SUER: 5.0000 mL | Freq: Every evening | ORAL | 0 refills | Status: DC | PRN

## 2018-02-03 NOTE — Telephone Encounter (Signed)
Order placed in yesterday's note, please sign Thanks

## 2018-02-03 NOTE — Telephone Encounter (Signed)
Patient states that he was in for a office visit yesterday and we were supposed to send in a prescription for [facility]ex to [facility] [address] and the pharmacy states that they have not received it.

## 2018-02-04 ENCOUNTER — Other Ambulatory Visit: Payer: Self-pay | Admitting: Pharmacist

## 2018-02-04 ENCOUNTER — Other Ambulatory Visit: Payer: Self-pay | Admitting: Family Medicine

## 2018-02-04 ENCOUNTER — Ambulatory Visit: Payer: Self-pay | Admitting: Pharmacist

## 2018-02-04 LAB — URINE CULTURE: Organism ID, Bacteria: NO GROWTH

## 2018-02-04 NOTE — Patient Outreach (Signed)
[facility]) Care Management  02/04/2018  Roger Rodriguez May 27, 1941 [id number]   77 y.o. year old male referred to [facility] for Medication Management (Myrbetriq follow up) Referred by Roger Rodriguez, [place] community, for medication assistance for Rytary.   Follow up with Roger Rodriguez regarding Myrbetriq (see note 01/21/18) and Rytary/PAN Foundation (see note 01/18/18)  Patient with [employer] Medicare Advantage plan.   Patient confirms identity with HIPAA-identifiers x2 and gives verbal consent to speak over the phone about medications.   Medication Assistance:  Patient reports he has not received his PAN foundation card in the mail (application approved 0/08/[id number]). He is due to run out of Rytary within the next week.   He received a sample of Myrbetriq from Roger Rodriguez office, picked up yesterday 02/03/2018. Roger Rodriguez also discussed returning to PTNS therapy when he is able as Myrbetriq is expensive with no eligible MAP and there are no other good alternatives for the patient. (see note 01/21/18 for detail).   Plan: 1. Placed call to [employer]. Representative states that PAN foundation card was mailed to patient on 01/18/2018. Double checked that address was correct. Requested another card be mailed to patient ASAP. Accessed patient's PAN foundation policy information.   2. [facility]. Provided Technician with [employer] information:   Member ID: [id number] Group ID: [id number] Rx BIN: [id number] PCN: [id number] Eligibility end date: 01/18/2019 Roger Rodriguez amount: $4,200  Technician was able to get a successful claim.   3. Called Roger Rodriguez to update him. He was not available. Left HIPAA complaint voicemail with call back number. Follow up Monday.   Roger Rodriguez, PharmD Clinical Pharmacist, [facility] [phone]

## 2018-02-07 ENCOUNTER — Other Ambulatory Visit: Payer: Self-pay | Admitting: Pharmacist

## 2018-02-07 ENCOUNTER — Telehealth: Payer: Self-pay

## 2018-02-07 NOTE — Telephone Encounter (Signed)
-----   Message from [name]., MD sent at 02/04/2018  8:27 AM EDT ----- Normal urine.

## 2018-02-07 NOTE — Telephone Encounter (Signed)
Left message to call back  

## 2018-02-07 NOTE — Telephone Encounter (Signed)
Pt returned call ° °teri °

## 2018-02-07 NOTE — Patient Outreach (Signed)
[facility]) Care Management  02/07/2018  Roger Rodriguez 01-14-1941 [id number]   77 y.o. year old male referred to [place] for Case Closure  Patient confirms identity with HIPAA-identifiers x2 and gives verbal consent to speak over the phone about medications.   Medication Assistance:  Updated patient on the status of his Rytary and the successful first use of his [place] [facility]. [name] was very pleased. Plans to pick up prescription tomorrow.    Patient has met all clinical pharmacy services at this time. Rytary medication assistance arranged. Medication list updated. Myrbetriq samples arranged, urologist aware of cost concerns. I will close out the pharmacy episode and remove myself from the care team. Discipline closure letter sent to primary care doctor. I will notify Landis Martins, [facility] community RNCM. Patient has my contact information should he have any clinical pharmacy needs in the future.   [name], PharmD Clinical Pharmacist, [facility] [phone]

## 2018-02-07 NOTE — Telephone Encounter (Signed)
Advised patient of results.  

## 2018-02-08 ENCOUNTER — Other Ambulatory Visit: Payer: Self-pay | Admitting: Family Medicine

## 2018-02-08 ENCOUNTER — Other Ambulatory Visit: Payer: Self-pay | Admitting: *Deleted

## 2018-02-08 MED ORDER — MIRABEGRON ER 25 MG PO TB24: ORAL_TABLET | ORAL | 11 refills | Status: DC

## 2018-02-08 NOTE — Patient Outreach (Signed)
[facility]) Care Management  02/08/2018  SHAKEEM STERN 27-May-1941 [id number]   Telephone follow up call   PMH includes but not limited to , 77 year old male with recent inpatient admission to [facility] 4/4/2-4/7, discharged to [facility] for rehab, admission diagnosis. Dehydration, AKI, weakness, fall , pain right hip pain , no fracture. Right heel wound on discharge from [facility]  Hx: Parkinson's, Recent Right knee sprain, HTN,history of right total knee arthroplasty.   Unsuccessful outreach call placed to patient home number, able to leave a HIPAA compliant message for return call.  Placed call to mobile number, unsuccessful able to leave a HIPAA compliant message for return call.     Plan  Will await return call , if no response will send unsuccessful outreach letter and plan return call in the next 4 days.   [name], RN, [place] Management Coordinator  [phone]- Mobile [phone]- Toll Free Main Office

## 2018-02-11 ENCOUNTER — Other Ambulatory Visit: Payer: Self-pay | Admitting: *Deleted

## 2018-02-11 NOTE — Patient Outreach (Signed)
[facility]) Care Management  02/11/2018  Roger Rodriguez April 25, 1941 [id number]  2nd call attempt.   PMH includes but not limited to , 77 year old male with recent inpatient admission to [facility] 4/4/2-4/7, discharged to [facility] for rehab, admission diagnosis. Dehydration, AKI, weakness, fall , pain right hip pain , no fracture. Right heel wound on discharge from [facility]  [id number], Recent Right knee sprain, HTN,history of right total knee arthroplasty.  Unsuccessful outreach call placed to patient preferred contact , able to leave a HIPAA compliant message for return call.   Plan  Will plan 3 rd call attempt on 8/7 as per work flow , no response from outreach letter sent.    [name], RN, [place] Management Coordinator  [phone]- Mobile [phone]- Toll Free Main Office

## 2018-02-16 ENCOUNTER — Other Ambulatory Visit: Payer: Self-pay | Admitting: *Deleted

## 2018-02-16 NOTE — Patient Outreach (Signed)
[facility]) Care Management  02/16/2018  Roger Rodriguez 09-30-1940 [id number]   Telephone assessment   Unsuccessful telephone attempt, this is 3rd telephone call attempt , able to leave HIPAA compliant telephone message for return call.  No response from outreach letter.    Plan  Unsuccessful call attempt x 3, no response from outreach letter.  Will await return call if no response will plan case closure on day 10 of outreaches.    [name], RN, [place] Management Coordinator  [phone]- Mobile [phone]- Toll Free Main Office

## 2018-02-17 DIAGNOSIS — Z96651 Presence of right artificial knee joint: Secondary | ICD-10-CM | POA: Diagnosis not present

## 2018-02-21 ENCOUNTER — Other Ambulatory Visit: Payer: Self-pay | Admitting: *Deleted

## 2018-02-21 NOTE — Patient Outreach (Signed)
[facility]) Care Management  02/21/2018  CLINTON DRAGONE [birth year] [id number]  Case Closure   Unsuccessful telephone outreach x 3, no response from outreach letter sent.   Plan  Will close case per workflow, unable to maintain contact Will send PCP case closure letter  Will send patient case closure letter    [name], RN, [place] Management Coordinator  [phone]- Mobile [phone]- [place]

## 2018-02-22 DIAGNOSIS — S91302A Unspecified open wound, left foot, initial encounter: Secondary | ICD-10-CM | POA: Diagnosis not present

## 2018-02-22 DIAGNOSIS — Z96651 Presence of right artificial knee joint: Secondary | ICD-10-CM | POA: Diagnosis not present

## 2018-02-28 ENCOUNTER — Encounter (INDEPENDENT_AMBULATORY_CARE_PROVIDER_SITE_OTHER): Payer: No Typology Code available for payment source

## 2018-02-28 ENCOUNTER — Other Ambulatory Visit: Payer: Self-pay | Admitting: Orthopedic Surgery

## 2018-02-28 ENCOUNTER — Ambulatory Visit (INDEPENDENT_AMBULATORY_CARE_PROVIDER_SITE_OTHER): Payer: No Typology Code available for payment source | Admitting: Vascular Surgery

## 2018-02-28 DIAGNOSIS — Z96651 Presence of right artificial knee joint: Secondary | ICD-10-CM

## 2018-03-02 ENCOUNTER — Ambulatory Visit: Payer: Self-pay

## 2018-03-02 ENCOUNTER — Encounter: Payer: Self-pay | Admitting: Family Medicine

## 2018-03-02 ENCOUNTER — Ambulatory Visit (INDEPENDENT_AMBULATORY_CARE_PROVIDER_SITE_OTHER): Payer: PPO | Admitting: Family Medicine

## 2018-03-02 VITALS — BP 122/64 | HR 86 | Temp 98.6°F | Resp 16 | Ht 71.0 in | Wt 212.0 lb

## 2018-03-02 DIAGNOSIS — Z0000 Encounter for general adult medical examination without abnormal findings: Secondary | ICD-10-CM

## 2018-03-02 DIAGNOSIS — I10 Essential (primary) hypertension: Secondary | ICD-10-CM

## 2018-03-02 DIAGNOSIS — G20 Parkinson's disease: Secondary | ICD-10-CM

## 2018-03-02 DIAGNOSIS — L309 Dermatitis, unspecified: Secondary | ICD-10-CM | POA: Diagnosis not present

## 2018-03-02 DIAGNOSIS — R350 Frequency of micturition: Secondary | ICD-10-CM

## 2018-03-02 DIAGNOSIS — R5383 Other fatigue: Secondary | ICD-10-CM

## 2018-03-02 DIAGNOSIS — E785 Hyperlipidemia, unspecified: Secondary | ICD-10-CM | POA: Diagnosis not present

## 2018-03-02 DIAGNOSIS — C61 Malignant neoplasm of prostate: Secondary | ICD-10-CM | POA: Diagnosis not present

## 2018-03-02 DIAGNOSIS — Z96651 Presence of right artificial knee joint: Secondary | ICD-10-CM | POA: Diagnosis not present

## 2018-03-02 MED ORDER — TRIAMCINOLONE ACETONIDE 0.1 % EX CREA: 1.0000 "application " | TOPICAL_CREAM | Freq: Two times a day (BID) | CUTANEOUS | 0 refills | Status: DC

## 2018-03-02 NOTE — Progress Notes (Signed)
Patient: Roger Rodriguez, Male    DOB: 09/14/1940, 77 y.o.   MRN: [id number] Visit Date: 03/02/2018  Today's Provider: Wilhemena Durie, MD   Chief Complaint  Patient presents with  . Annual Exam   Subjective:    Annual wellness visit Roger Rodriguez is a 77 y.o. male. He feels well. He reports exercising none since his hospitalization back in 10/2017. He reports he is sleeping well.  Colonoscopy- ? Tdap- 11/30/2006.  Patient reports that it is becoming more difficult to hold his urine. He is currently taking Myrbetriq 25mg  with no relief. He is having more accidents because it takes a while for him to go from sitting to standing position.   Review of Systems  Constitutional: Positive for activity change and fatigue.  HENT: Negative.   Eyes: Negative.   Respiratory: Negative for cough, chest tightness and wheezing.   Cardiovascular: Positive for leg swelling.  Gastrointestinal: Negative for abdominal distention, abdominal pain, anal bleeding, blood in stool, constipation, diarrhea, nausea and rectal pain.  Endocrine: Negative.   Genitourinary: Positive for frequency and urgency. Negative for decreased urine volume, difficulty urinating, discharge, enuresis, hematuria, penile swelling and testicular pain.  Musculoskeletal: Positive for arthralgias, back pain, gait problem, joint swelling and myalgias.  Skin: Negative for color change, pallor, rash and wound.  Allergic/Immunologic: Positive for environmental allergies.  Neurological: Positive for tremors. Negative for dizziness, light-headedness, numbness and headaches.       Has Parkinson's disease  Hematological: Negative for adenopathy. Does not bruise/bleed easily.  Psychiatric/Behavioral: Negative for agitation, dysphoric mood, self-injury, sleep disturbance and suicidal ideas. The patient is not nervous/anxious and is not hyperactive.     Social History   Socioeconomic History  . Marital status: Widowed    Spouse  name: Not on file  . Number of children: 2  . Years of education: Not on file  . Highest education level: Some college, no degree  Occupational History  . Occupation: retired  Scientific laboratory technician  . Financial resource strain: Not hard at all  . Food insecurity:    Worry: Never true    Inability: Never true  . Transportation needs:    Medical: No    Non-medical: No  Tobacco Use  . Smoking status: Former Smoker    Types: Cigars    Last attempt to quit: 11/11/1968    Years since quitting: 49.3  . Smokeless tobacco: Former Systems developer    Types: Chew    Quit date: 11/12/2003  . Tobacco comment: Occassionally, also chewed tobacco till 2005. Quit smoking 1973  Substance and Sexual Activity  . Alcohol use: No    Alcohol/week: 0.0 standard drinks  . Drug use: No  . Sexual activity: Never  Lifestyle  . Physical activity:    Days per week: Not on file    Minutes per session: Not on file  . Stress: To some extent  Relationships  . Social connections:    Talks on phone: Not on file    Gets together: Not on file    Attends religious service: Not on file    Active member of club or organization: Not on file    Attends meetings of clubs or organizations: Not on file    Relationship status: Not on file  . Intimate partner violence:    Fear of current or ex partner: Not on file    Emotionally abused: Not on file    Physically abused: Not on file  Forced sexual activity: Not on file  Other Topics Concern  . Not on file  Social History Narrative  . Not on file    Past Medical History:  Diagnosis Date  . Allergy    allergic rhinitis  . Arthritis    OA  . Chronic kidney disease    stones  . Depression   . Family history of adverse reaction to anesthesia   . Hematuria   . History of kidney stones   . Hyperlipidemia   . Hypertension    no meds since parkinsons meds started  . Incontinence of urine   . Inguinal hernia   . Neuromuscular disorder ([place])    parkinsons  . Overactive bladder    . Parkinson's disease ([place])   . Prostate cancer ([place])   . Rhinitis, allergic   . Shortness of breath dyspnea    doe secondary to parkinsons     Patient Active Problem List   Diagnosis Date Noted  . Chronic venous insufficiency 11/22/2017  . ARF (acute renal failure) ([place]) 10/12/2017  . RUQ pain 10/22/2016  . Depression, major, single episode, moderate ([place]) 10/22/2016  . Renal cyst, right 09/28/2016  . Hepatic steatosis 09/28/2016  . Parkinson's disease ([place]) 09/18/2015  . Restless leg 09/18/2015  . Malignant neoplasm of prostate ([place]) 05/22/2015  . Hepatitis A 04/02/2015  . Allergic rhinitis 04/02/2015  . Arthritis 04/02/2015  . Narrowing of intervertebral disc space 04/02/2015  . 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 Accumulation of fluid in tissues 04/02/2015  . Essential (primary) hypertension 04/02/2015  . Borderline diabetes 04/02/2015  . Hyperlipidemia 04/02/2015  . BP (high blood pressure) 04/02/2015  . Infected sebaceous cyst 04/02/2015  . Hernia, inguinal 04/02/2015  . Leg pain 04/02/2015  . Lumbar canal stenosis 04/02/2015  . 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 Malignant melanoma ([place]) 04/02/2015  . Arthritis, degenerative 04/02/2015  . Fatigue 04/02/2015  . Idiopathic Parkinson's disease ([place]) 04/02/2015  . Plantar fasciitis 04/02/2015  . CA of prostate ([place]) 04/02/2015  . Benign essential tremor 04/02/2015  . Lower urinary tract infection 04/02/2015  . Degeneration of intervertebral disc of lumbosacral region 05/10/2012    Past Surgical History:  Procedure Laterality Date  . BACK SURGERY  2014  . CYSTOSCOPY WITH STENT PLACEMENT Right 01/13/2016   Procedure: CYSTOSCOPY WITH STENT PLACEMENT;  Surgeon: Hollice Espy, MD;  Location: [facility] ORS;  Service: Urology;  Laterality: Right;  . CYSTOSCOPY/URETEROSCOPY/HOLMIUM LASER/STENT PLACEMENT Left 11/20/2015   Procedure: CYSTOSCOPY/URETEROSCOPY/HOLMIUM LASER/STENT PLACEMENT;  Surgeon: Hollice Espy, MD;  Location: [facility] ORS;  Service: Urology;  Laterality: Left;  . EYE SURGERY    . FRACTURE SURGERY      ankle   . H/O arthroscopy of right knee  2015  . H/O radical protatectomy    . HERNIA REPAIR    . JOINT REPLACEMENT     right knee  . LITHOTRIPSY  2010  . Rotator Cuff tear  1993   Repaired  . URETEROSCOPY WITH HOLMIUM LASER LITHOTRIPSY  2002?  [name] URETEROSCOPY WITH HOLMIUM LASER LITHOTRIPSY Right 01/13/2016   Procedure: URETEROSCOPY WITH HOLMIUM LASER LITHOTRIPSY;  Surgeon: Hollice Espy, MD;  Location: [facility] ORS;  Service: Urology;  Laterality: Right;  [name] VASECTOMY     History    His family history includes Bladder Cancer in his paternal uncle; Cerebral aneurysm in his father; Dementia in his mother; Liver cancer in his sister. There is no history of Prostate cancer or Kidney cancer.  Current Outpatient Medications:  .  acetaminophen-codeine (TYLENOL #3) 300-30 MG tablet, Take 1 tablet by mouth. Every 8 hours as needed, Disp: , Rfl:  .  Amino Acids-Protein Hydrolys (FEEDING SUPPLEMENT, PRO-STAT SUGAR FREE 64,) LIQD, Take 30 mLs by mouth 2 (two) times daily., Disp: , Rfl:  .  amoxicillin-clavulanate (AUGMENTIN) 875-125 MG tablet, Take 1 tablet by mouth. Prior to dental procedure, Disp: , Rfl:  .  budesonide (RHINOCORT AQUA) 32 MCG/ACT nasal spray, USE 1 PUFF IN EACH NOSTRIL EVERY DAY AS NEEDED, Disp: 1 Bottle, Rfl: 5 .  budesonide (RHINOCORT AQUA) 32 MCG/ACT nasal spray, USE 1 PUFF IN EACH NOSTRIL EVERY DAY AS NEEDED, Disp: 8.6 mL, Rfl: 5 .  cholecalciferol (VITAMIN D) 1000 units tablet, Take 1,000 Units by mouth daily. , Disp: , Rfl:  .  Cyanocobalamin (VITAMIN B 12 PO), Take 1,000 mcg by mouth daily., Disp: , Rfl:  .  erythromycin ophthalmic ointment, Place 1 application into both eyes at bedtime. , Disp: , Rfl:  .  gabapentin (NEURONTIN) 400 MG capsule, Take 400 mg by mouth 3 (three) times daily. 9am, 1pm, 9pm, Disp: , Rfl: 2 .  Hypromellose (ARTIFICIAL TEARS OP), Place  1 drop into both eyes 3 (three) times daily as needed (dry eyes)., Disp: , Rfl:  .  ketoconazole (NIZORAL) 2 % shampoo, Apply 1 application topically See admin instructions. Wash beard and scalp with shampoo 3 times week (Monday, Wednesday, Friday) - let sit and soak, then rinse, Disp: , Rfl:  .  lisinopril (PRINIVIL,ZESTRIL) 2.5 MG tablet, Take 2.5 mg by mouth 2 (two) times daily. , Disp: , Rfl:  .  loratadine (CLARITIN) 10 MG tablet, Take 10 mg by mouth daily., Disp: , Rfl:  .  Melatonin 3 MG TABS, Take 1 tablet by mouth at bedtime., Disp: , Rfl:  .  meloxicam (MOBIC) 15 MG tablet, Take 1 tablet by mouth daily., Disp: , Rfl:  .  mirabegron ER (MYRBETRIQ) 25 MG TB24 tablet, Myrbetriq 25 mg tablet,extended release  TAKE 1 TABLET (25 MG TOTAL) 2 (TWO) TIMES DAILY BY MOUTH., Disp: 30 tablet, Rfl: 11 .  Multiple Vitamin (MULTIVITAMIN WITH MINERALS) TABS tablet, Take 1 tablet by mouth daily., Disp: , Rfl:  .  naproxen (NAPROSYN) 375 MG tablet, Take 375 mg by mouth 2 (two) times daily., Disp: , Rfl:  .  naproxen sodium (ALEVE) 220 MG tablet, Take 220 mg by mouth daily., Disp: , Rfl:  .  OVER THE COUNTER MEDICATION, Take by mouth See admin instructions. Drink 4 oz (120 ml) prune juice by mouth daily, Disp: , Rfl:  .  pramipexole (MIRAPEX) 0.5 MG tablet, Take 2 tablets (1 mg total) by mouth at bedtime. (Patient taking differently: Take 0.5 mg by mouth 3 (three) times daily. Takes 1/2 tablet in the morning and 1 tablet at night.), Disp: 30 tablet, Rfl: 0 .  RYTARY 23.75-95 MG CPCR, Take 3 capsules by mouth 4 (four) times daily. , Disp: , Rfl: 3 .  traMADol (ULTRAM) 50 MG tablet, Take 50 mg by mouth every 6 (six) hours as needed. for pain, Disp: , Rfl: 0 .  vitamin C (ASCORBIC ACID) 500 MG tablet, Take 500 mg by mouth daily., Disp: , Rfl:  .  vitamin E (VITAMIN E) 200 UNIT capsule, Take 400 Units by mouth daily. , Disp: , Rfl:  .  chlorpheniramine-HYDROcodone (TUSSIONEX PENNKINETIC ER) 10-8 MG/5ML SUER, Take  5 mLs by mouth at bedtime as needed for cough. (Patient not taking: Reported on 03/02/2018),  Disp: 140 mL, Rfl: 0  Patient Care Team: [name]., MD as PCP - General (Family Medicine) Hollice Espy, MD as Consulting Physician (Urology) Vladimir Crofts, MD as Consulting Physician (Neurology) Leandrew Koyanagi, MD as Referring Physician (Ophthalmology) [name], MD as Consulting Physician (Orthopedic Surgery) Lang Snow, NP as Nurse Practitioner (Neurology) Nadene Rubins, DO as Referring Physician (Physical Medicine and Rehabilitation) Schnier, Dolores Lory, MD as Consulting Physician (Vascular Surgery)     Objective:   Vitals: BP 122/64 (BP Location: Left Arm, Patient Position: Sitting, Cuff Size: Large)   Pulse 86   Temp 98.6 F (37 C)   Resp 16   Ht 5\' 11"  (1.803 m)   Wt 212 lb (96.2 kg)   SpO2 96%   BMI 29.57 kg/m   Physical Exam  Constitutional: He is oriented to person, place, and time. He appears well-developed and well-nourished.  Pt in wheelchair due to limitations from Parkinsons and OA.  HENT:  Head: Normocephalic and atraumatic.  Right Ear: External ear normal.  Left Ear: External ear normal.  Nose: Nose normal.  Mouth/Throat: Oropharynx is clear and moist.  Eyes: Conjunctivae are normal. No scleral icterus.  Neck: No thyromegaly present.  Cardiovascular: Normal rate, regular rhythm, normal heart sounds and intact distal pulses.  Pulmonary/Chest: Effort normal and breath sounds normal.  Abdominal: Soft.  Neurological: He is alert and oriented to person, place, and time. He exhibits abnormal muscle tone.  Parkinsons stigmata.  Skin: Skin is warm and dry.  Very fair skin.  Psychiatric: He has a normal mood and affect. His behavior is normal. Judgment and thought content normal.    Activities of Daily Living In your present state of health, do you have any difficulty performing the following activities: 02/02/2018 11/23/2017    Hearing? N N  Vision? N Y  Difficulty concentrating or making decisions? N Y  Walking or climbing stairs? Y Y  Comment Due to knee pain. uses walker and standby assist   Dressing or bathing? Tempie Donning  Comment Has private care full time. has personal care assistance available 24 hours   Doing errands, shopping? Y Y  Comment Does not drive.  has 24 hours assist   Preparing Food and eating ? Tempie Donning  Comment Has assistance. -  Using the Toilet? N Y  In the past six months, have you accidently leaked urine? Tempie Donning  Comment Wears protection daily.  wears depends   Do you have problems with loss of bowel control? N N  Managing your Medications? N Y  Comment - family/staff assistance   Managing your Finances? N Y  Housekeeping or managing your Housekeeping? Tempie Donning  Comment Has assistance with cleaning.  family/staff available  Some recent data might be hidden    Fall Risk Assessment Fall Risk  02/02/2018 11/18/2017 09/13/2017 10/22/2016 10/02/2015  Falls in the past year? Yes Yes Yes Yes Yes  Number falls in past yr: 2 or more 2 or more 2 or more 2 or more 2 or more  Injury with Fall? Yes Yes Yes No No  Comment damaged artifical replacement in knee and had to have sx. - - - -  Risk Factor Category  - - High Fall Risk - -  Risk for fall due to : - History of fall(s) History of fall(s);Impaired balance/gait;Impaired mobility - Impaired balance/gait  Follow up Falls prevention discussed Falls evaluation completed Falls evaluation completed;Education provided;Falls prevention discussed Falls prevention discussed -  Depression Screen PHQ 2/9 Scores 02/02/2018 11/18/2017 04/15/2017 02/23/2017  PHQ - 2 Score 2 2 3 4   PHQ- 9 Score 12 11 14 15     Cognitive Testing - 6-CIT 6CIT Screen 02/02/2018 10/22/2016  What Year? 0 points 0 points  What month? 0 points 0 points  What time? 0 points 0 points  Count back from 20 0 points 0 points  Months in reverse 0 points 0 points  Repeat phrase 2 points 2 points  Total  Score 2 2         Assessment & Plan:     Annual Wellness Visit  Reviewed patient's Family Medical History Reviewed and updated list of patient's medical providers Assessment of cognitive impairment was done Assessed patient's functional ability Established a written schedule for health screening [place] Completed and Reviewed  Exercise Activities and Dietary recommendations Goals    . DIET - REDUCE PORTION SIZE     Recommend monitoring portion sizes and decreasing portions by half and continuing to eat healthy snacks in between.     . Increase water intake     Recommend increasing water intake to 4-5 glasses a day.        Immunization History  Administered Date(s) Administered  . Influenza, High Dose Seasonal PF 04/02/2015, 04/02/2016, 04/15/2017  . Influenza,inj,Quad PF,6+ Mos 04/03/2014  . Pneumococcal Conjugate-13 10/22/2016  . Pneumococcal Polysaccharide-23 04/06/2013, 04/05/2014  . Td 11/30/2006  . Zoster 04/06/2013    Health Maintenance  Topic Date Due  . Samul Dada  11/29/2016  . INFLUENZA VACCINE  02/10/2018  . PNA vac Low Risk Adult  Completed     Discussed health benefits of physical activity, and encouraged him to engage in regular exercise appropriate for his age and condition.   1. Encounter for annual physical exam   2. Essential (primary) hypertension  - CBC with Differential/Platelet - Comprehensive metabolic panel  3. CA of prostate (HCC)  - PSA  4. Other fatigue  - CBC with Differential/Platelet - TSH  5. Hyperlipidemia, unspecified hyperlipidemia type  - Lipid panel  6. Urinary frequency  - Urine Culture  7. Dermatitis of palms  - triamcinolone cream (KENALOG) 0.1 %; Apply 1 application topically 2 (two) times daily.  Dispense: 30 g; Refill: 0 8.Parkinsons disease 9.OA  I have done the exam and reviewed the above chart and it is accurate to the best of my knowledge. Development worker, community has  been used in this note in any air is in the dictation or transcription are unintentional.  Wilhemena Durie, MD  [place]

## 2018-03-03 LAB — CBC WITH DIFFERENTIAL/PLATELET
Basophils Absolute: 0 10*3/uL (ref 0.0–0.2)
Basos: 0 %
EOS (ABSOLUTE): 0.2 10*3/uL (ref 0.0–0.4)
Eos: 2 %
Hematocrit: 36.8 % — ABNORMAL LOW (ref 37.5–51.0)
Hemoglobin: 12.1 g/dL — ABNORMAL LOW (ref 13.0–17.7)
Immature Grans (Abs): 0 10*3/uL (ref 0.0–0.1)
Immature Granulocytes: 0 %
Lymphocytes Absolute: 1.1 10*3/uL (ref 0.7–3.1)
Lymphs: 12 %
MCH: 29.5 pg (ref 26.6–33.0)
MCHC: 32.9 g/dL (ref 31.5–35.7)
MCV: 90 fL (ref 79–97)
Monocytes Absolute: 1 10*3/uL — ABNORMAL HIGH (ref 0.1–0.9)
Monocytes: 11 %
Neutrophils Absolute: 6.8 10*3/uL (ref 1.4–7.0)
Neutrophils: 75 %
Platelets: 371 10*3/uL (ref 150–450)
RBC: 4.1 x10E6/uL — ABNORMAL LOW (ref 4.14–5.80)
RDW: 14.2 % (ref 12.3–15.4)
WBC: 9 10*3/uL (ref 3.4–10.8)

## 2018-03-03 LAB — COMPREHENSIVE METABOLIC PANEL
ALT: 3 IU/L (ref 0–44)
AST: 7 IU/L (ref 0–40)
Albumin/Globulin Ratio: 1 — ABNORMAL LOW (ref 1.2–2.2)
Albumin: 3.7 g/dL (ref 3.5–4.8)
Alkaline Phosphatase: 85 IU/L (ref 39–117)
BUN/Creatinine Ratio: 19 (ref 10–24)
BUN: 22 mg/dL (ref 8–27)
Bilirubin Total: 0.4 mg/dL (ref 0.0–1.2)
CO2: 27 mmol/L (ref 20–29)
Calcium: 9.3 mg/dL (ref 8.6–10.2)
Chloride: 94 mmol/L — ABNORMAL LOW (ref 96–106)
Creatinine, Ser: 1.13 mg/dL (ref 0.76–1.27)
GFR calc Af Amer: 72 mL/min/{1.73_m2} (ref >59)
GFR calc non Af Amer: 62 mL/min/{1.73_m2} (ref >59)
Globulin, Total: 3.7 g/dL (ref 1.5–4.5)
Glucose: 156 mg/dL — ABNORMAL HIGH (ref 65–99)
Potassium: 4.9 mmol/L (ref 3.5–5.2)
Sodium: 133 mmol/L — ABNORMAL LOW (ref 134–144)
Total Protein: 7.4 g/dL (ref 6.0–8.5)

## 2018-03-03 LAB — LIPID PANEL
Chol/HDL Ratio: 5.1 ratio — ABNORMAL HIGH (ref 0.0–5.0)
Cholesterol, Total: 173 mg/dL (ref 100–199)
HDL: 34 mg/dL — ABNORMAL LOW (ref >39)
LDL Calculated: 116 mg/dL — ABNORMAL HIGH (ref 0–99)
Triglycerides: 113 mg/dL (ref 0–149)
VLDL Cholesterol Cal: 23 mg/dL (ref 5–40)

## 2018-03-03 LAB — TSH: TSH: 1.92 u[IU]/mL (ref 0.450–4.500)

## 2018-03-03 LAB — PSA: Prostate Specific Ag, Serum: <0.1 ng/mL (ref 0.0–4.0)

## 2018-03-04 LAB — URINE CULTURE: Organism ID, Bacteria: NO GROWTH

## 2018-03-10 ENCOUNTER — Encounter
Admission: RE | Admit: 2018-03-10 | Discharge: 2018-03-10 | Disposition: A | Discharge status: Home / Self Care | Payer: PPO | Source: Ambulatory Visit | Attending: Orthopedic Surgery | Admitting: Orthopedic Surgery

## 2018-03-10 DIAGNOSIS — Z96651 Presence of right artificial knee joint: Secondary | ICD-10-CM | POA: Insufficient documentation

## 2018-03-10 DIAGNOSIS — M1712 Unilateral primary osteoarthritis, left knee: Secondary | ICD-10-CM | POA: Diagnosis not present

## 2018-03-10 MED ORDER — TECHNETIUM TC 99M MEDRONATE IV KIT
23.4000 | PACK | Freq: Once | INTRAVENOUS | Status: AC | PRN
  Administered 2018-03-10: 23.4 via INTRAVENOUS

## 2018-03-11 ENCOUNTER — Other Ambulatory Visit
Admission: RE | Admit: 2018-03-11 | Discharge: 2018-03-11 | Disposition: A | Discharge status: Home / Self Care | Payer: PPO | Source: Ambulatory Visit | Attending: Physician Assistant | Admitting: Physician Assistant

## 2018-03-11 ENCOUNTER — Encounter: Payer: PPO | Attending: Physician Assistant | Admitting: Physician Assistant

## 2018-03-11 ENCOUNTER — Ambulatory Visit: Payer: PPO | Admitting: Family Medicine

## 2018-03-11 DIAGNOSIS — L539 Erythematous condition, unspecified: Secondary | ICD-10-CM | POA: Diagnosis not present

## 2018-03-11 DIAGNOSIS — L02212 Cutaneous abscess of back [any part, except buttock]: Secondary | ICD-10-CM | POA: Diagnosis not present

## 2018-03-11 DIAGNOSIS — M069 Rheumatoid arthritis, unspecified: Secondary | ICD-10-CM | POA: Insufficient documentation

## 2018-03-11 DIAGNOSIS — G20 Parkinson's disease: Secondary | ICD-10-CM | POA: Insufficient documentation

## 2018-03-11 DIAGNOSIS — Z882 Allergy status to sulfonamides status: Secondary | ICD-10-CM | POA: Diagnosis not present

## 2018-03-11 DIAGNOSIS — B999 Unspecified infectious disease: Secondary | ICD-10-CM | POA: Insufficient documentation

## 2018-03-11 DIAGNOSIS — I872 Venous insufficiency (chronic) (peripheral): Secondary | ICD-10-CM | POA: Diagnosis not present

## 2018-03-11 DIAGNOSIS — I10 Essential (primary) hypertension: Secondary | ICD-10-CM | POA: Insufficient documentation

## 2018-03-11 DIAGNOSIS — L02415 Cutaneous abscess of right lower limb: Secondary | ICD-10-CM | POA: Diagnosis not present

## 2018-03-14 LAB — AEROBIC CULTURE  (SUPERFICIAL SPECIMEN)

## 2018-03-14 LAB — AEROBIC CULTURE W GRAM STAIN (SUPERFICIAL SPECIMEN)

## 2018-03-15 ENCOUNTER — Telehealth: Payer: Self-pay | Admitting: Family Medicine

## 2018-03-15 DIAGNOSIS — T8453XA Infection and inflammatory reaction due to internal right knee prosthesis, initial encounter: Secondary | ICD-10-CM | POA: Diagnosis not present

## 2018-03-15 NOTE — Telephone Encounter (Signed)
Pt's son [name] called wanting to get an opinion from Dr. Rosanna Randy about his dad.  Dad is a patient at [facility]  Dr. 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  The replacement was done in 2015 but he fell in April and injured it.  They have tried wound care but it did not help.  It could be a life or death situation if they do not go in and get the knee replacement out to get rid of the infection.  The son is afraid his dad will not walk again if he has this surgery.  He would just like to talk to him to see if there are any options.  The surgery is already scheduled for this Thursday.    Pt's son's  CB# [phone]    Thanks teri

## 2018-03-15 NOTE — Telephone Encounter (Signed)
Spoke with son.

## 2018-03-15 NOTE — Telephone Encounter (Signed)
Please review. Thanks!  

## 2018-03-16 ENCOUNTER — Telehealth: Payer: Self-pay | Admitting: Family Medicine

## 2018-03-16 DIAGNOSIS — T8450XA Infection and inflammatory reaction due to unspecified internal joint prosthesis, initial encounter: Secondary | ICD-10-CM | POA: Insufficient documentation

## 2018-03-16 NOTE — Telephone Encounter (Signed)
Received medical clearance form from [facility], placed form in provider box. Thanks CC

## 2018-03-16 NOTE — Progress Notes (Signed)
Roger Rodriguez ([id number]) Visit Report for 03/11/2018 Abuse/Suicide Risk Screen Details Patient Name: Roger Rodriguez, Roger Rodriguez Date of Service: 03/11/2018 12:45 PM Medical Record Number: [id number] Patient Account Number: [id number] Date of Birth/Sex: [birth year] (77 y.o. Male) Treating RN: Roger Shelter Primary Care Baine Decesare: Cranford Mon, Delfino Lovett Other Clinician: Referring Gracin Mcpartland: Kurtis Bushman Treating Remmington Teters/Extender: Melburn Hake, HOYT Weeks in Treatment: 0 Abuse/Suicide Risk Screen Items Answer ABUSE/SUICIDE RISK SCREEN: Has anyone close to you tried to hurt or harm you recentlyo No Do you feel uncomfortable with anyone in your familyo No Has anyone forced you do things that you didnot want to doo No Do you have any thoughts of harming yourselfo No Patient displays signs or symptoms of abuse and/or neglect. No Electronic Signature(s) Signed: 03/15/2018 4:40:26 PM By: Roger Shelter Entered By: Roger Shelter on 03/11/2018 13:13:08 Urbanek, [name] ([id number]) -------------------------------------------------------------------------------- Activities of Daily Living Details Patient Name: Roger Rodriguez Date of Service: 03/11/2018 12:45 PM Medical Record Number: [id number] Patient Account Number: [id number] Date of Birth/Sex: May 22, 1941 (77 y.o. Male) Treating RN: Roger Shelter Primary Care Naven Giambalvo: Cranford Mon, Delfino Lovett Other Clinician: Referring Rhea Kaelin: Kurtis Bushman Treating Feleica Fulmore/Extender: Melburn Hake, HOYT Weeks in Treatment: 0 Activities of Daily Living Items Answer Activities of Daily Living (Please select one for each item) Drive Automobile Not Able Take Medications Need Assistance Use Telephone Need Assistance Care for Appearance Need Assistance Use Toilet Need Assistance Bath / Shower Need Assistance Dress Self Need Assistance Feed Self Need Assistance Walk Not Able Get In / Out Bed Need Assistance Housework Not Able Prepare Meals Need Assistance Handle  Money Need Assistance Shop for Self Need Assistance Electronic Signature(s) Signed: 03/15/2018 4:40:26 PM By: Roger Shelter Entered By: Roger Shelter on 03/11/2018 13:13:53 Dimattia, [name] ([id number]) -------------------------------------------------------------------------------- Education Assessment Details Patient Name: Roger Rodriguez Date of Service: 03/11/2018 12:45 PM Medical Record Number: [id number] Patient Account Number: [id number] Date of Birth/Sex: August 29, 1940 (77 y.o. Male) Treating RN: Roger Shelter Primary Care Catilyn Boggus: Cranford Mon, Delfino Lovett Other Clinician: Referring Jeanett Antonopoulos: Kurtis Bushman Treating Goldia Ligman/Extender: Melburn Hake, HOYT Weeks in Treatment: 0 Primary Learner Assessed: Patient Learning Preferences/Education Level/Primary Language Learning Preference: Explanation Highest Education Level: College or Above Preferred Language: English Cognitive Barrier Assessment/Beliefs Language Barrier: No Translator Needed: No Memory Deficit: No Emotional Barrier: No Cultural/Religious Beliefs Affecting Medical Care: No Physical Barrier Assessment Impaired Vision: Yes Glasses Impaired Hearing: No Decreased Hand dexterity: No Knowledge/Comprehension Assessment Knowledge Level: High Comprehension Level: High Ability to understand written High instructions: Ability to understand verbal High instructions: Motivation Assessment Anxiety Level: Calm Cooperation: Cooperative Education Importance: Acknowledges Need Interest in Health Problems: Asks Questions Perception: Coherent Willingness to Engage in Self- High Management Activities: Electronic Signature(s) Signed: 03/15/2018 4:40:26 PM By: Roger Shelter Entered By: Roger Shelter on 03/11/2018 13:14:30 Roger Rodriguez ([id number]) -------------------------------------------------------------------------------- Fall Risk Assessment Details Patient Name: Roger Rodriguez Date of Service: 03/11/2018  12:45 PM Medical Record Number: [id number] Patient Account Number: [id number] Date of Birth/Sex: 29-Apr-1941 (77 y.o. Male) Treating RN: Roger Shelter Primary Care Liana Camerer: Cranford Mon, Delfino Lovett Other Clinician: Referring Kailand Seda: Kurtis Bushman Treating Apryle Stowell/Extender: Melburn Hake, HOYT Weeks in Treatment: 0 Fall Risk Assessment Items Have you had 2 or more falls in the last 12 monthso 0 Yes Have you had any fall that resulted in injury in the last 12 monthso 0 Yes FALL RISK ASSESSMENT: History of falling - immediate or within 3 months 25 Yes Secondary diagnosis 0 No Ambulatory aid None/bed rest/wheelchair/nurse 0 No Crutches/cane/walker 0 No Furniture 0 No IV Access/Saline  Lock 0 No Gait/Training Normal/bed rest/immobile 0 No Weak 0 No Impaired 0 No Mental Status Oriented to own ability 0 No Electronic Signature(s) Signed: 03/15/2018 4:40:26 PM By: Roger Shelter Entered By: Roger Shelter on 03/11/2018 13:14:58 Wiehe, [name] ([id number]) -------------------------------------------------------------------------------- Foot Assessment Details Patient Name: Roger Rodriguez Date of Service: 03/11/2018 12:45 PM Medical Record Number: [id number] Patient Account Number: [id number] Date of Birth/Sex: 04-24-1941 (77 y.o. Male) Treating RN: Roger Shelter Primary Care Dinita Migliaccio: Cranford Mon, Delfino Lovett Other Clinician: Referring Kru Allman: Kurtis Bushman Treating Eulalio Reamy/Extender: Melburn Hake, HOYT Weeks in Treatment: 0 Foot Assessment Items Site Locations + = Sensation present, - = Sensation absent, C = Callus, U = Ulcer R = Redness, W = Warmth, M = Maceration, PU = Pre-ulcerative lesion F = Fissure, S = Swelling, D = Dryness Assessment Right: Left: Other Deformity: No No Prior Foot Ulcer: No No Prior Amputation: No No Charcot Joint: No No Ambulatory Status: Non-ambulatory Assistance Device: Wheelchair Gait: Administrator, arts) Signed: 03/15/2018 4:40:26 PM By:  Roger Shelter Entered By: Roger Shelter on 03/11/2018 13:16:49 Strub, [name] ([id number]) -------------------------------------------------------------------------------- Nutrition Risk Assessment Details Patient Name: Roger Rodriguez Date of Service: 03/11/2018 12:45 PM Medical Record Number: [id number] Patient Account Number: [id number] Date of Birth/Sex: 01/24/1941 (77 y.o. Male) Treating RN: Roger Shelter Primary Care Crandall Harvel: Cranford Mon, Delfino Lovett Other Clinician: Referring Ayrton Mcvay: Kurtis Bushman Treating Christol Thetford/Extender: Melburn Hake, HOYT Weeks in Treatment: 0 Height (in): 71 Weight (lbs): 212 Body Mass Index (BMI): 29.6 Nutrition Risk Assessment Items NUTRITION RISK SCREEN: I have an illness or condition that made me change the kind and/or amount of 0 No food I eat I eat fewer than two meals per day 0 No I eat few fruits and vegetables, or milk products 0 No I have three or more drinks of beer, liquor or wine almost every day 0 No I have tooth or mouth problems that make it hard for me to eat 0 No I don't always have enough money to buy the food I need 0 No I eat alone most of the time 0 No I take three or more different prescribed or over-the-counter drugs a day 0 No Without wanting to, I have lost or gained 10 pounds in the last six months 0 No I am not always physically able to shop, cook and/or feed myself 0 No Nutrition Protocols Good Risk Protocol 0 No interventions needed Moderate Risk Protocol Electronic Signature(s) Signed: 03/15/2018 4:40:26 PM By: Roger Shelter Entered By: Roger Shelter on 03/11/2018 13:15:24

## 2018-03-16 NOTE — Progress Notes (Signed)
Roger Rodriguez, Roger Rodriguez ([id number]) Visit Report for 03/11/2018 Chief Complaint Document Details Patient Name: Roger Rodriguez, Roger Rodriguez Date of Service: 03/11/2018 12:45 PM Medical Record Number: [id number] Patient Account Number: [id number] Date of Birth/Sex: [birth year] (77 y.o. Male) Treating RN: Montey Hora Primary Care Provider: Cranford Mon, Delfino Lovett Other Clinician: Referring Provider: Kurtis Bushman Treating Provider/Extender: Melburn Hake, HOYT Weeks in Treatment: 0 Information Obtained from: Patient Chief Complaint Right knee abscess/infection due to hardware infection Electronic Signature(s) Signed: 03/11/2018 2:13:04 PM By: Worthy Keeler PA-C Entered By: Worthy Keeler on 03/11/2018 13:55:03 Roger Rodriguez, Roger Rodriguez ([id number]) -------------------------------------------------------------------------------- HPI Details Patient Name: Roger Rodriguez Date of Service: 03/11/2018 12:45 PM Medical Record Number: [id number] Patient Account Number: [id number] Date of Birth/Sex: Dec 30, 1940 (77 y.o. Male) Treating RN: Montey Hora Primary Care Provider: Wilhemena Durie Other Clinician: Referring Provider: Kurtis Bushman Treating Provider/Extender: Melburn Hake, HOYT Weeks in Treatment: 0 History of Present Illness HPI Description: 03/11/18 on evaluation today patient presents for initial inspection our clinic concerning what was stated to be a heel ulcer on the left. With that being said that is completely closed at this point there does not appear to be any evidence of issues currently. Nonetheless the bigger issue that I see upon evaluation today is that he has a significant area of erythema of the right knee unfortunately with a fairly large flush when abscess on the lateral portion of the knee. He did have a triple phase of bone scan performed yesterday which showed evidence of findings consistent with either a septic loosening or infection of the right knee prosthesis. With this abscess present today I  do believe this is likely due to infection. This was discussed with patient. No fevers, chills, nausea, or vomiting noted at this time. He has been having some discomfort but states that the pain is not terrible. When I push over the abscess area he did have a little bit of discomfort at this point. However he did not really have much in the way of sharp sensation in fact he did not even need me to numb it when I tested him with the scalpel he was not feeling the sharp sensation over the abscess area. He does have a history of Parkinson's disease, hypertension, chronic venous insufficiency, and having had a previous right knee replacement. Electronic Signature(s) Signed: 03/11/2018 2:13:04 PM By: Worthy Keeler PA-C Entered By: Worthy Keeler on 03/11/2018 13:56:49 Roger Rodriguez, Roger Rodriguez ([id number]) -------------------------------------------------------------------------------- Incision and Drainage Details Patient Name: Roger Rodriguez Date of Service: 03/11/2018 12:45 PM Medical Record Number: [id number] Patient Account Number: [id number] Date of Birth/Sex: 01-21-1941 (77 y.o. Male) Treating RN: Montey Hora Primary Care Provider: Cranford Mon, Delfino Lovett Other Clinician: Referring Provider: Kurtis Bushman Treating Provider/Extender: Melburn Hake, HOYT Weeks in Treatment: 0 Incision And Drainage Wound #1 Right, Lateral Knee Performed for: Performed By: Physician STONE III, HOYT E., PA-C Incision And Drainage Abscess Type: Location: right lateral knee Pre-procedure Yes - 13:40 Verification/Time Out Taken: Pain Control: Lidocaine 4% Topical Solution Drainage Of: Purulent, Sero-Sanguineous Instrument: Blade Bleeding: Minimum Hemostasis Achieved: Pressure Culture Sent: Swab Procedural Pain: 0 Post Procedural Pain: 0 Response to Treatment: Procedure was tolerated well Level of Consciousness: Awake and Alert Post Procedure Diagnosis Same as Pre-procedure Electronic Signature(s) Signed:  03/11/2018 2:13:04 PM By: Worthy Keeler PA-C Signed: 03/11/2018 4:44:09 PM By: Montey Hora Entered By: Montey Hora on 03/11/2018 13:47:08 Gilliand, Roger Rodriguez ([id number]) -------------------------------------------------------------------------------- Physical Exam Details Patient Name: Roger Rodriguez Date of Service: 03/11/2018 12:45 PM Medical  Record Number: [id number] Patient Account Number: [id number] Date of Birth/Sex: Jul 19, 1940 (77 y.o. Male) Treating RN: Montey Hora Primary Care Provider: Cranford Mon, Delfino Lovett Other Clinician: Referring Provider: Kurtis Bushman Treating Provider/Extender: Melburn Hake, HOYT Weeks in Treatment: 0 Constitutional patient is hypertensive.. pulse regular and within target range for patient.[name] respirations regular, non-labored and within target range for patient.[name] temperature within target range for patient.. Well-nourished and well-hydrated in no acute distress. Eyes conjunctiva clear no eyelid edema noted. pupils equal round and reactive to light and accommodation. Ears, Nose, Mouth, and Throat no gross abnormality of ear auricles or external auditory canals. normal hearing noted during conversation. mucus membranes moist. Respiratory normal breathing without difficulty. clear to auscultation bilaterally. Cardiovascular regular rate and rhythm with normal S1, S2. 2+ dorsalis pedis/posterior tibialis pulses. no clubbing, cyanosis, significant edema, <3 sec cap refill. Gastrointestinal (GI) soft, non-tender, non-distended, +BS. no ventral hernia noted. Musculoskeletal normal gait and posture. no significant deformity or arthritic changes, no loss or range of motion, no clubbing. Psychiatric this patient is able to make decisions and demonstrates good insight into disease process. Alert and Oriented x 3. pleasant and cooperative. Notes At this point based on what I'm seeing visually I do believe that the abscess is likely emanating from the knee. I  did discuss with the patient that really we probably need to initiate oral antibiotic therapy although he may actually require IV antibiotic therapy. Subsequently I'm also gonna recommend getting touch with his orthopedic specialist which we are working on doing while he's in the office today. Nonetheless I do believe that he is going to require packing of this abscess region and in the end likely surgery in order to replace the infected hardware and get things under control. Nonetheless he did consent today to incision and drainage of the abscess. This was performed today I did not need to use any numbing medication as the patient did not experience any discomfort even with be testing him to see whether or not he was expressing sharp sensation. I was able to express a significant amount of purulent discharge from the abscess area today. This did sufficiently decompress the abscess region but unfortunately when probing into the abscess wound it does actually track back to the joint line laterally which has me somewhat worried about this currently. I do believe that based on the results from his triple phase bone scan this is likely an infected hardware situation. Electronic Signature(s) Signed: 03/11/2018 2:13:04 PM By: Worthy Keeler PA-C Entered By: Worthy Keeler on 03/11/2018 14:01:01 Roger Rodriguez, Roger Rodriguez ([id number]) -------------------------------------------------------------------------------- Physician Orders Details Patient Name: Roger Rodriguez Date of Service: 03/11/2018 12:45 PM Medical Record Number: [id number] Patient Account Number: [id number] Date of Birth/Sex: 04/21/1941 (77 y.o. Male) Treating RN: Montey Hora Primary Care Provider: Cranford Mon, Delfino Lovett Other Clinician: Referring Provider: Kurtis Bushman Treating Provider/Extender: Melburn Hake, HOYT Weeks in Treatment: 0 Verbal / Phone Orders: No Diagnosis Coding Wound Cleansing Wound #1 Right,Lateral Knee o Clean wound  with Normal Saline. Anesthetic (add to Medication List) Wound #1 Right,Lateral Knee o Topical Lidocaine 4% cream applied to wound bed prior to debridement (In Clinic Only). Primary Wound Dressing Wound #1 Right,Lateral Knee o Iodoform packing Gauze - 1/2 inch Secondary Dressing Wound #1 Right,Lateral Knee o ABD and Kerlix/Conform Dressing Change Frequency Wound #1 Right,Lateral Knee o Change dressing every day. Follow-up Appointments Wound #1 Right,Lateral Knee o Return Appointment in 1 week. Edema Control Wound #1 Right,Lateral Knee o Other: - ACE wrap Additional Orders /  Instructions Wound #1 Right,Lateral Knee o Increase protein intake. o Activity as tolerated Laboratory o Bacteria identified in Wound by Culture (MICRO) oooo LOINC Code: 9326-7 oooo Convenience Name: Wound culture routine Patient Medications Roger Rodriguez, Roger Rodriguez ([id number]) Allergies: Sulfa (Sulfonamide Antibiotics) Notifications Medication Indication Start End doxycycline hyclate 03/11/2018 DOSE 1 - oral 100 mg capsule - 1 capsule oral taken 2 times a day for 14 days Electronic Signature(s) Signed: 03/11/2018 2:07:16 PM By: Worthy Keeler PA-C Entered By: Worthy Keeler on 03/11/2018 14:07:16 Roger Rodriguez, Roger Rodriguez ([id number]) -------------------------------------------------------------------------------- Problem List Details Patient Name: Roger Rodriguez Date of Service: 03/11/2018 12:45 PM Medical Record Number: [id number] Patient Account Number: [id number] Date of Birth/Sex: 06-17-1941 (77 y.o. Male) Treating RN: Montey Hora Primary Care Provider: Cranford Mon, Delfino Lovett Other Clinician: Referring Provider: Kurtis Bushman Treating Provider/Extender: Melburn Hake, HOYT Weeks in Treatment: 0 Active Problems ICD-10 Evaluated Encounter Code Description Active Date Today Diagnosis T84.53XA Infection and inflammatory reaction due to internal right knee 03/11/2018 No Yes prosthesis, initial  encounter L02.415 Cutaneous abscess of right lower limb 03/11/2018 No Yes G20 Parkinson's disease 03/11/2018 No Yes I10 Essential (primary) hypertension 03/11/2018 No Yes I87.2 Venous insufficiency (chronic) (peripheral) 03/11/2018 No Yes Inactive Problems Resolved Problems Electronic Signature(s) Signed: 03/11/2018 2:13:04 PM By: Worthy Keeler PA-C Entered By: Worthy Keeler on 03/11/2018 13:54:14 [place], Roger Rodriguez ([id number]) -------------------------------------------------------------------------------- Progress Note Details Patient Name: Roger Rodriguez Date of Service: 03/11/2018 12:45 PM Medical Record Number: [id number] Patient Account Number: [id number] Date of Birth/Sex: 07/17/1940 (77 y.o. Male) Treating RN: Montey Hora Primary Care Provider: Cranford Mon, Delfino Lovett Other Clinician: Referring Provider: Kurtis Bushman Treating Provider/Extender: Melburn Hake, HOYT Weeks in Treatment: 0 Subjective Chief Complaint Information obtained from Patient Right knee abscess/infection due to hardware infection History of Present Illness (HPI) 03/11/18 on evaluation today patient presents for initial inspection our clinic concerning what was stated to be a heel ulcer on the left. With that being said that is completely closed at this point there does not appear to be any evidence of issues currently. Nonetheless the bigger issue that I see upon evaluation today is that he has a significant area of erythema of the right knee unfortunately with a fairly large flush when abscess on the lateral portion of the knee. He did have a triple phase of bone scan performed yesterday which showed evidence of findings consistent with either a septic loosening or infection of the right knee prosthesis. With this abscess present today I do believe this is likely due to infection. This was discussed with patient. No fevers, chills, nausea, or vomiting noted at this time. He has been having some discomfort but  states that the pain is not terrible. When I push over the abscess area he did have a little bit of discomfort at this point. However he did not really have much in the way of sharp sensation in fact he did not even need me to numb it when I tested him with the scalpel he was not feeling the sharp sensation over the abscess area. He does have a history of Parkinson's disease, hypertension, chronic venous insufficiency, and having had a previous right knee replacement. Wound History Patient presents with 2 open wounds that have been present for approximately 5 months. Patient has been treating wounds in the following manner: cream and gauze. Laboratory tests have not been performed in the last month. Patient reportedly has not tested positive for an antibiotic resistant organism. Patient reportedly has not tested positive for osteomyelitis.  Patient reportedly has had testing performed to evaluate circulation in the legs. Patient History Allergies Sulfa (Sulfonamide Antibiotics) Family History Cancer - Siblings, Heart Disease - Mother, Stroke - Father, No family history of Diabetes, Hypertension, Kidney Disease, Lung Disease, Seizures, Thyroid Problems, Tuberculosis. Social History Never smoker, Marital Status - Widowed, Alcohol Use - Never, Drug Use - No History, Caffeine Use - Moderate. Medical History Eyes Patient has history of Cataracts - removed bilateral Denies history of Glaucoma, Optic Neuritis Ear/Nose/Mouth/Throat Patient has history of Chronic sinus problems/congestion Hematologic/Lymphatic Patient has history of Lymphedema Denies history of Anemia, Hemophilia, Human Immunodeficiency Virus, Sickle Cell Disease Respiratory Roger Rodriguez, Roger Rodriguez ([id number]) Denies history of Aspiration, Asthma, Chronic Obstructive Pulmonary Disease (COPD), Pneumothorax, Sleep Apnea Cardiovascular Patient has history of Hypertension Denies history of Angina, Arrhythmia, Congestive Heart Failure,  Coronary Artery Disease, Deep Vein Thrombosis, Hypotension, Myocardial Infarction, Peripheral Arterial Disease, Peripheral Venous Disease, Phlebitis, Vasculitis Gastrointestinal Denies history of Cirrhosis , Colitis, Crohn s, Hepatitis A, Hepatitis B, Hepatitis C Endocrine Denies history of Type I Diabetes, Type II Diabetes Immunological Denies history of Lupus Erythematosus, Raynaud s Integumentary (Skin) Denies history of History of Burn, History of pressure wounds Musculoskeletal Patient has history of Rheumatoid Arthritis Denies history of Gout, Osteoarthritis Neurologic Denies history of Dementia, Neuropathy, Quadriplegia, Paraplegia, Seizure Disorder Oncologic Denies history of Received Chemotherapy, Received Radiation Psychiatric Denies history of Anorexia/bulimia, Confinement Anxiety Review of Systems (ROS) Constitutional Symptoms (General Health) Denies complaints or symptoms of Fatigue, Fever, Chills, Marked Weight Change. Eyes Complains or has symptoms of Glasses / Contacts - glasses. Ear/Nose/Mouth/Throat Denies complaints or symptoms of Difficult clearing ears, Sinusitis. Hematologic/Lymphatic Denies complaints or symptoms of Bleeding / Clotting Disorders, Human Immunodeficiency Virus. Respiratory Complains or has symptoms of Shortness of Breath - with exerction. Denies complaints or symptoms of Chronic or frequent coughs. Cardiovascular Complains or has symptoms of LE edema. Denies complaints or symptoms of Chest pain. Gastrointestinal Denies complaints or symptoms of Nausea, Vomiting. Endocrine Denies complaints or symptoms of Hepatitis, Thyroid disease, Polydypsia (Excessive Thirst). Genitourinary Complains or has symptoms of Incontinence/dribbling. Denies complaints or symptoms of Kidney failure/ Dialysis. Immunological Denies complaints or symptoms of Hives, Itching. Integumentary (Skin) Complains or has symptoms of Wounds, Bleeding or bruising tendency,  Swelling. Denies complaints or symptoms of Breakdown. Musculoskeletal Complains or has symptoms of Muscle Pain, Muscle Weakness. Neurologic Denies complaints or symptoms of Numbness/parasthesias, Focal/Weakness. Oncologic prostate surgically removed Psychiatric Complains or has symptoms of Anxiety. Denies complaints or symptoms of Claustrophobia. Roger Rodriguez, Roger Rodriguez ([id number]) Objective Constitutional patient is hypertensive.. pulse regular and within target range for patient.[name] respirations regular, non-labored and within target range for patient.[name] temperature within target range for patient.. Well-nourished and well-hydrated in no acute distress. Vitals Time Taken: 1:01 PM, Height: 71 in, Weight: 212 lbs, Source: Stated, BMI: 29.6, Temperature: 98.2 F, Pulse: 90 bpm, Respiratory Rate: 18 breaths/min, Blood Pressure: 143/69 mmHg. Eyes conjunctiva clear no eyelid edema noted. pupils equal round and reactive to light and accommodation. Ears, Nose, Mouth, and Throat no gross abnormality of ear auricles or external auditory canals. normal hearing noted during conversation. mucus membranes moist. Respiratory normal breathing without difficulty. clear to auscultation bilaterally. Cardiovascular regular rate and rhythm with normal S1, S2. 2+ dorsalis pedis/posterior tibialis pulses. no clubbing, cyanosis, significant edema, Gastrointestinal (GI) soft, non-tender, non-distended, +BS. no ventral hernia noted. Musculoskeletal normal gait and posture. no significant deformity or arthritic changes, no loss or range of motion, no clubbing. Psychiatric this patient is able to make decisions and demonstrates  good insight into disease process. Alert and Oriented x 3. pleasant and cooperative. General Notes: At this point based on what I'm seeing visually I do believe that the abscess is likely emanating from the knee. I did discuss with the patient that really we probably need to initiate oral  antibiotic therapy although he may actually require IV antibiotic therapy. Subsequently I'm also gonna recommend getting touch with his orthopedic specialist which we are working on doing while he's in the office today. Nonetheless I do believe that he is going to require packing of this abscess region and in the end likely surgery in order to replace the infected hardware and get things under control. Nonetheless he did consent today to incision and drainage of the abscess. This was performed today I did not need to use any numbing medication as the patient did not experience any discomfort even with be testing him to see whether or not he was expressing sharp sensation. I was able to express a significant amount of purulent discharge from the abscess area today. This did sufficiently decompress the abscess region but unfortunately when probing into the abscess wound it does actually track back to the joint line laterally which has me somewhat worried about this currently. I do believe that based on the results from his triple phase bone scan this is likely an infected hardware situation. Integumentary (Hair, Skin) Wound #1 status is Open. Original cause of wound was Gradually Appeared. The wound is located on the Right,Lateral Knee. The wound measures 0.1cm length x 0.1cm width x 0.1cm depth; 0.008cm^2 area and 0.001cm^3 volume. There is no tunneling or undermining noted. There is a none present amount of drainage noted. The wound margin is indistinct and nonvisible. There is no granulation within the wound bed. There is a large (67-100%) amount of necrotic tissue within the wound bed including Eschar. The periwound skin appearance did not exhibit: Callus, Crepitus, Excoriation, Induration, Rash, Malinoski, Darrelle E. ([id number]) Scarring, Dry/Scaly, Maceration, Atrophie Blanche, Cyanosis, Ecchymosis, Hemosiderin Staining, Mottled, Pallor, Rubor, Erythema. Periwound temperature was noted as No  Abnormality. The periwound has tenderness on palpation. 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 Abscess incision and drainage was provided by STONE III, HOYT E., PA-C. The skin was cleansed and prepped with anti-septic followed by pain control using Lidocaine 4% Topical Solution. An incision was made in the right lateral knee with the following instrument(s): Blade. There was an immediate release of Purulent and Sero-Sanguineous fluid. A Minimum amount of bleeding was controlled with Pressure. A time out was conducted at 13:40, prior to the start of the procedure. Swab culture was sent. The procedure was tolerated well with a pain level of 0 throughout and a pain level of 0 following the procedure. Patient s Level of Consciousness post procedure was recorded as Awake and Alert. Post procedure Diagnosis Wound #1: Same as Pre-Procedure Plan Wound Cleansing: Wound #1 Right,Lateral Knee: Clean wound with Normal Saline. Anesthetic (add to Medication List): Wound #1 Right,Lateral Knee: Topical Lidocaine 4% cream applied to wound bed prior to debridement (In Clinic Only). Primary Wound Dressing: Wound #1 Right,Lateral Knee: Iodoform packing Gauze - 1/2 inch Secondary Dressing: Wound #1 Right,Lateral Knee: ABD and Kerlix/Conform Dressing Change Frequency: Wound #1 Right,Lateral Knee: Change dressing  every day. Follow-up Appointments: Wound #1 Right,Lateral Knee: Roger Rodriguez, Roger Rodriguez ([id number]) Return Appointment in 1 week. Edema Control: Wound #1 Right,Lateral Knee: Other: - ACE wrap Additional Orders / Instructions: Wound #1 Right,Lateral Knee: Increase  protein intake. Activity as tolerated Laboratory ordered were: Wound culture routine The following medication(s) was prescribed: doxycycline hyclate oral 100 mg capsule 1 1 capsule oral taken 2 times a day for 14 days starting 03/11/2018 My plan at this point was after discussing with Melissa at Dr. Alvira Philips office was to go ahead and pack the wound with Iodoform gauze and then subsequently we are going to also go ahead and place him on an antibiotic I did obtain a wound culture as well today so that we can determine exactly what may be causing the infection. I actually cleaned out the majority of the purulent drainage from the ulceration/abscess prior to performing a deep culture down to the point where this seems to come in contact with the hardware at the base of the abscess. Subsequently we did in the end pack this with Iodoform gauze before dressing wrapping this with an ace wrap. Melissa who is the nurse for Dr. Exie Parody states that she is gonna send him a message as well to see what he would like to do I know the patient does have an appointment with him on Tuesday I'm not sure if they want him to wait till Tuesday or if they wanted him to go ahead and go to the hospital for potential IV antibiotic therapy possibly even allowing Dr. Harlow Mares to see him since he is actually on call. Nonetheless we will wait to see what the plan is from them. If anything changes or worsens in the interim the patient will contact our office or his orthopedic surgeon, Dr. Harlow Mares, for additional recommendations. In the meantime just as a precaution as I'm not sure what the plan with that being I am sending the doxycycline into the pharmacy for him. Please see above for specific wound care orders. We will see patient for re-evaluation in 1 week(s) here in the clinic. If anything worsens or changes patient will contact our office for additional recommendations. Electronic Signature(s) Signed: 03/11/2018 2:13:04 PM By:  Worthy Keeler PA-C Entered By: Worthy Keeler on 03/11/2018 14:07:33 Muckle, Roger Rodriguez ([id number]) -------------------------------------------------------------------------------- ROS/PFSH Details Patient Name: Roger Rodriguez Date of Service: 03/11/2018 12:45 PM Medical Record Number: [id number] Patient Account Number: [id number] Date of Birth/Sex: [birth year] (77 y.o. Male) Treating RN: Roger Shelter Primary Care Provider: Cranford Mon, Delfino Lovett Other Clinician: Referring Provider: Kurtis Bushman Treating Provider/Extender: Melburn Hake, HOYT Weeks in Treatment: 0 Wound History Do you currently have one or more open woundso Yes How many open wounds do you currently haveo 2 Approximately how long have you had your woundso 5 months How have you been treating your wound(s) until nowo cream and gauze Has your wound(s) ever healed and then re-openedo No Have you had any lab work done in the past montho No Have you tested positive for an antibiotic resistant organism (MRSA, VRE)o No Have you tested positive for osteomyelitis (bone infection)o No Have you had any tests for circulation on your legso Yes Who ordered the testo 2 months Constitutional Symptoms (General Health) Complaints and Symptoms: Negative for: Fatigue; Fever; Chills; Marked Weight Change Eyes Complaints and Symptoms: Positive for: Glasses / Contacts - glasses Medical History: Positive for: Cataracts - removed bilateral Negative for: Glaucoma; Optic Neuritis Ear/Nose/Mouth/Throat Complaints and Symptoms: Negative for: Difficult clearing ears; Sinusitis  Medical History: Positive for: Chronic sinus problems/congestion Hematologic/Lymphatic Complaints and Symptoms: Negative for: Bleeding / Clotting Disorders; Human Immunodeficiency Virus Medical History: Positive for: Lymphedema Negative for: Anemia; Hemophilia; Human Immunodeficiency Virus; Sickle Cell Disease Respiratory Complaints and Symptoms: Positive for:  Shortness of Breath - with exerction Negative for: Chronic or frequent coughs Medical HistoryLOCKE, BARRELL ([id number]) Negative for: Aspiration; Asthma; Chronic Obstructive Pulmonary Disease (COPD); Pneumothorax; Sleep Apnea Cardiovascular Complaints and Symptoms: Positive for: LE edema Negative for: Chest pain Medical History: Positive for: Hypertension Negative for: Angina; Arrhythmia; Congestive Heart Failure; Coronary Artery Disease; Deep Vein Thrombosis; Hypotension; Myocardial Infarction; Peripheral Arterial Disease; Peripheral Venous Disease; Phlebitis; Vasculitis Gastrointestinal Complaints and Symptoms: Negative for: Nausea; Vomiting Medical History: Negative for: Cirrhosis ; Colitis; Crohnos; Hepatitis A; Hepatitis B; Hepatitis C Endocrine Complaints and Symptoms: Negative for: Hepatitis; Thyroid disease; Polydypsia (Excessive Thirst) Medical History: Negative for: Type I Diabetes; Type II Diabetes Genitourinary Complaints and Symptoms: Positive for: Incontinence/dribbling Negative for: Kidney failure/ Dialysis Immunological Complaints and Symptoms: Negative for: Hives; Itching Medical History: Negative for: Lupus Erythematosus; Raynaudos Integumentary (Skin) Complaints and Symptoms: Positive for: Wounds; Bleeding or bruising tendency; Swelling Negative for: Breakdown Medical History: Negative for: History of Burn; History of pressure wounds Musculoskeletal Complaints and Symptoms: Positive for: Muscle Pain; Muscle Weakness Medical History: Positive for: Rheumatoid Arthritis Santillanes, Jaise E. ([id number]) Negative for: Gout; Osteoarthritis Neurologic Complaints and Symptoms: Negative for: Numbness/parasthesias; Focal/Weakness Medical History: Negative for: Dementia; Neuropathy; Quadriplegia; Paraplegia; Seizure Disorder Psychiatric Complaints and Symptoms: Positive for: Anxiety Negative for: Claustrophobia Medical History: Negative for:  Anorexia/bulimia; Confinement Anxiety Oncologic Complaints and Symptoms: Review of System Notes: prostate surgically removed Medical History: Negative for: Received Chemotherapy; Received Radiation HBO Extended History Items Eyes: Ear/Nose/Mouth/Throat: Cataracts Chronic sinus problems/congestion Immunizations Pneumococcal Vaccine: Received Pneumococcal Vaccination: Yes Implantable Devices Family and Social History Cancer: Yes - Siblings; Diabetes: No; Heart Disease: Yes - Mother; Hypertension: No; Kidney Disease: No; Lung Disease: No; Seizures: No; Stroke: Yes - Father; Thyroid Problems: No; Tuberculosis: No; Never smoker; Marital Status - Widowed; Alcohol Use: Never; Drug Use: No History; Caffeine Use: Moderate; Financial Concerns: No; Food, Clothing or Shelter Needs: No; Support System Lacking: No; Transportation Concerns: No; Advanced Directives: Yes (Not Provided); Do not resuscitate: No; Living Will: Yes (Not Provided); Medical Power of Attorney: Yes (Not Provided) Electronic Signature(s) Signed: 03/11/2018 2:13:04 PM By: Worthy Keeler PA-C Signed: 03/15/2018 4:40:26 PM By: Roger Shelter Entered By: Roger Shelter on 03/11/2018 13:12:56 Hornig, Roger Rodriguez ([id number]) -------------------------------------------------------------------------------- SuperBill Details Patient Name: Roger Rodriguez Date of Service: 03/11/2018 Medical Record Number: [id number] Patient Account Number: [id number] Date of Birth/Sex: [birth year] (77 y.o. 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 Parkinson's disease I10 Essential (primary)  hypertension I87.2 Venous insufficiency (chronic) (peripheral) Facility Procedures CPT4 Code Description: 19379024 99213 - WOUND CARE VISIT-LEV 3 EST PT Modifier: Quantity: 1 CPT4 Code Description: 09735329 10060 - I and D Abscess; simple/single ICD-10 Diagnosis Description L02.415 Cutaneous abscess of right lower limb T84.53XA Infection and inflammatory reaction due to internal right knee pr Modifier: osthesis, initi Quantity: 1 al encounter Physician Procedures CPT4 Code Description: 9242683 41962 - WC PHYS LEVEL 4 - NEW PT ICD-10 Diagnosis Description T84.53XA Infection and inflammatory reaction due to internal right knee  p L02.415 Cutaneous abscess of right lower limb G20 Parkinson's disease I10 Essential  (primary) hypertension Modifier: 25 rosthesis, initia Quantity: 1 l encounter CPT4 Code Description: 4401027 25366 - I and D Abscess; simple/single ICD-10 Diagnosis Description L02.415 Cutaneous abscess of right lower limb T84.53XA Infection and inflammatory reaction due to internal right knee p Modifier: rosthesis, initia Quantity: 1 l encounter Electronic Signature(s) Signed: 03/11/2018 2:09:30 PM By: Montey Hora Signed: 03/11/2018 2:13:04 PM By: Worthy Keeler PA-C Entered By: Montey Hora on 03/11/2018 14:09:30

## 2018-03-16 NOTE — Progress Notes (Signed)
Roger Rodriguez ([id number]) Visit Report for 03/11/2018 Allergy List Details Patient Name: Roger Rodriguez, Roger Rodriguez Date of Service: 03/11/2018 12:45 PM Medical Record Number: [id number] Patient Account Number: [id number] Date of Birth/Sex: July 12, 1941 (77 y.o. Male) Treating RN: Roger Shelter Primary Care Aidaly Cordner: Cranford Mon, Delfino Lovett Other Clinician: Referring Torryn Fiske: Kurtis Bushman Treating Janyiah Silveri/Extender: STONE III, HOYT Weeks in Treatment: 0 Allergies Active Allergies Sulfa (Sulfonamide Antibiotics) Allergy Notes Electronic Signature(s) Signed: 03/11/2018 4:44:09 PM By: Montey Hora Entered By: Montey Hora on 03/11/2018 13:40:12 Coop, [name] ([id number]) -------------------------------------------------------------------------------- Arrival Information Details Patient Name: Roger Rodriguez Date of Service: 03/11/2018 12:45 PM Medical Record Number: [id number] Patient Account Number: [id number] Date of Birth/Sex: September 10, 1940 (77 y.o. Male) Treating RN: Roger Shelter Primary Care Mamie Diiorio: Cranford Mon, Delfino Lovett Other Clinician: Referring Kalynne Womac: Kurtis Bushman Treating [name]/Extender: Melburn Hake, HOYT Weeks in Treatment: 0 Visit Information Patient Arrived: Wheel Chair Arrival Time: 13:00 Accompanied By: caregiver Transfer Assistance: EasyPivot Patient Lift Patient Identification Verified: Yes Secondary Verification Process Yes Completed: Electronic Signature(s) Signed: 03/15/2018 4:40:26 PM By: Roger Shelter Entered By: Roger Shelter on 03/11/2018 13:00:59 Roger Rodriguez ([id number]) -------------------------------------------------------------------------------- Clinic Level of Care Assessment Details Patient Name: Roger Rodriguez Date of Service: 03/11/2018 12:45 PM Medical Record Number: [id number] Patient Account Number: [id number] Date of Birth/Sex: 1941-05-06 (77 y.o. Male) Treating RN: Montey Hora Primary Care [name]: Cranford Mon, Delfino Lovett Other  Clinician: Referring [name]: Kurtis Bushman Treating Elam Ellis/Extender: Melburn Hake, HOYT Weeks in Treatment: 0 Clinic Level of Care Assessment Items TOOL 1 Quantity Score []  - Use when EandM and Procedure is performed on INITIAL visit 0 ASSESSMENTS - Nursing Assessment / Reassessment X - General Physical Exam (combine w/ comprehensive assessment (listed just below) when 1 20 performed on new pt. evals) X- 1 25 Comprehensive Assessment (HX, ROS, Risk Assessments, Wounds Hx, etc.) ASSESSMENTS - Wound and Skin Assessment / Reassessment []  - Dermatologic / Skin Assessment (not related to wound area) 0 ASSESSMENTS - Ostomy and/or Continence Assessment and Care []  - Incontinence Assessment and Management 0 []  - 0 Ostomy Care Assessment and Management (repouching, etc.) PROCESS - Coordination of Care X - Simple Patient / Family Education for ongoing care 1 15 []  - 0 Complex (extensive) Patient / Family Education for ongoing care X- 1 10 Staff obtains Programmer, systems, Records, Test Results / Process Orders []  - 0 Staff telephones HHA, Nursing Homes / Clarify orders / etc []  - 0 Routine Transfer to another Facility (non-emergent condition) []  - 0 Routine Hospital Admission (non-emergent condition) X- 1 15 New Admissions / Biomedical engineer / Ordering NPWT, Apligraf, etc. []  - 0 Emergency Hospital Admission (emergent condition) PROCESS - Special Needs []  - Pediatric / Minor Patient Management 0 []  - 0 Isolation Patient Management []  - 0 Hearing / Language / Visual special needs []  - 0 Assessment of Community assistance (transportation, D/C planning, etc.) []  - 0 Additional assistance / Altered mentation []  - 0 Support Surface(s) Assessment (bed, cushion, seat, etc.) JOCSAN, MCGINLEY. ([id number]) INTERVENTIONS - Miscellaneous []  - External ear exam 0 []  - 0 Patient Transfer (multiple staff / Civil Service fast streamer / Similar devices) []  - 0 Simple Staple / Suture removal (25 or less) []   - 0 Complex Staple / Suture removal (26 or more) []  - 0 Hypo/Hyperglycemic Management (do not check if billed separately) X- 1 15 Ankle / Brachial Index (ABI) - do not check if billed separately Has the patient been seen at the hospital within the last three years: Yes Total Score: 100 Level Of  Care: New/Established - Level 3 Electronic Signature(s) Signed: 03/11/2018 4:44:09 PM By: Montey Hora Entered By: Montey Hora on 03/11/2018 14:09:15 Dillinger, [name] ([id number]) -------------------------------------------------------------------------------- Lower Extremity Assessment Details Patient Name: Roger Rodriguez Date of Service: 03/11/2018 12:45 PM Medical Record Number: [id number] Patient Account Number: [id number] Date of Birth/Sex: 1941-01-31 (77 y.o. Male) Treating RN: Roger Shelter Primary Care Shemica Meath: Cranford Mon, Delfino Lovett Other Clinician: Referring Syrenity Klepacki: Kurtis Bushman Treating Ethelbert Thain/Extender: Melburn Hake, HOYT Weeks in Treatment: 0 Edema Assessment Assessed: [Left: No] [Right: No] Edema: [Left: Yes] [Right: Yes] Calf Left: Right: Point of Measurement: 34 cm From Medial Instep 40 cm 43.2 cm Ankle Left: Right: Point of Measurement: 11 cm From Medial Instep 23.5 cm 22 cm Vascular Assessment Claudication: Claudication Assessment [Left:None] [Right:None] Pulses: Dorsalis Pedis Palpable: [Left:Yes] [Right:Yes] Doppler Audible: [Left:Yes] [Right:Yes] Posterior Tibial Extremity colors, hair growth, and conditions: Extremity Color: [Left:Normal] [Right:Normal] Hair Growth on Extremity: [Left:Yes] [Right:Yes] Temperature of Extremity: [Left:Warm] [Right:Warm] Capillary Refill: [Left:< 3 seconds] [Right:< 3 seconds] Blood Pressure: Brachial: [Left:138] [Right:132] Dorsalis Pedis: 156 [Left:Dorsalis Pedis: 180] Ankle: Posterior Tibial: 150 [Left:Posterior Tibial: 200 1.13] [Right:1.45] Toe Nail Assessment Left: Right: Thick: No No Discolored: No  No Deformed: No No Improper Length and Hygiene: No No Electronic Signature(s) Signed: 03/15/2018 4:40:26 PM By: Roger Shelter Entered By: Roger Shelter on 03/11/2018 13:22:20 Poplar, MATHAYUS STANBERY ([id number]) RYHEEM, JAY ([id number]) -------------------------------------------------------------------------------- Multi Wound Chart Details Patient Name: Roger Rodriguez Date of Service: 03/11/2018 12:45 PM Medical Record Number: [id number] Patient Account Number: [id number] Date of Birth/Sex: [birth year] (77 y.o. Male) Treating RN: Montey Hora Primary Care Dacota Ruben: Cranford Mon, Delfino Lovett Other Clinician: Referring Dyshaun Bonzo: Kurtis Bushman Treating Terrance Lanahan/Extender: Melburn Hake, HOYT Weeks in Treatment: 0 Vital Signs Height(in): 71 Pulse(bpm): 90 Weight(lbs): 212 Blood Pressure(mmHg): 143/69 Body Mass Index(BMI): 30 Temperature(F): 98.2 Respiratory Rate 18 (breaths/min): Photos: [1:No Photos] [N/A:N/A] Wound Location: [1:Right Knee - Lateral] [N/A:N/A] Wounding Event: [1:Gradually Appeared] [N/A:N/A] Primary Etiology: [1:Abscess] [N/A:N/A] Comorbid History: [1:Cataracts, Chronic sinus problems/congestion, Lymphedema, Hypertension, Rheumatoid Arthritis] [N/A:N/A] Date Acquired: [1:10/11/2017] [N/A:N/A] Weeks of Treatment: [1:0] [N/A:N/A] Wound Status: [1:Open] [N/A:N/A] Measurements L x W x D [1:0.1x0.1x0.1] [N/A:N/A] (cm) Area (cm) : [1:0.008] [N/A:N/A] Volume (cm) : [1:0.001] [N/A:N/A] Classification: [1:Unclassifiable] [N/A:N/A] Exudate Amount: [1:None Present] [N/A:N/A] Wound Margin: [1:Indistinct, nonvisible] [N/A:N/A] Granulation Amount: [1:None Present (0%)] [N/A:N/A] Necrotic Amount: [1:Large (67-100%)] [N/A:N/A] Necrotic Tissue: [1:Eschar] [N/A:N/A] Exposed Structures: [1:Fascia: No Fat Layer (Subcutaneous Tissue) Exposed: No Tendon: No Muscle: No Joint: No Bone: No] [N/A:N/A] Epithelialization: [1:None] [N/A:N/A] Periwound Skin Texture: [1:Excoriation: No  Induration: No Callus: No Crepitus: No Rash: No Scarring: No] [N/A:N/A] Periwound Skin Moisture: [1:Maceration: No Dry/Scaly: No] [N/A:N/A] Periwound Skin Color: Atrophie Blanche: No N/A N/A Cyanosis: No Ecchymosis: No Erythema: No Hemosiderin Staining: No Mottled: No Pallor: No Rubor: No Temperature: No Abnormality N/A N/A Tenderness on Palpation: Yes N/A N/A Wound Preparation: Ulcer Cleansing: N/A N/A Rinsed/Irrigated with Saline Topical Anesthetic Applied: Other: lidocaine 4% Treatment Notes Electronic Signature(s) Signed: 03/11/2018 4:44:09 PM By: Montey Hora Entered By: Montey Hora on 03/11/2018 13:39:30 Ryner, [name] ([id number]) -------------------------------------------------------------------------------- [name] Details Patient Name: Roger Rodriguez Date of Service: 03/11/2018 12:45 PM Medical Record Number: [id number] Patient Account Number: [id number] Date of Birth/Sex: [birth year] (77 y.o. Male) Treating RN: Montey Hora Primary Care Aiyden Lauderback: Cranford Mon, Delfino Lovett Other Clinician: Referring Clover Feehan: Kurtis Bushman Treating Darey Hershberger/Extender: Melburn Hake, HOYT Weeks in Treatment: 0 Active Inactive ` Abuse / Safety / Falls / Self Care Management Nursing Diagnoses: Potential for falls Goals: Patient will remain injury  free related to falls Date Initiated: 03/11/2018 Target Resolution Date: 05/13/2018 Goal Status: Active Interventions: Assess fall risk on admission and as needed Notes: ` Orientation to the Wound Care Program Nursing Diagnoses: Knowledge deficit related to the wound healing center program Goals: Patient/caregiver will verbalize understanding of the [place] Date Initiated: 03/11/2018 Target Resolution Date: 05/13/2018 Goal Status: Active Interventions: Provide education on orientation to the wound center Notes: ` Wound/Skin Impairment Nursing Diagnoses: Impaired tissue  integrity Goals: Ulcer/skin breakdown will heal within 14 weeks Date Initiated: 03/11/2018 Target Resolution Date: 05/13/2018 Goal Status: Active Interventions: JAHAAD, PENADO ([id number]) Assess patient/caregiver ability to obtain necessary supplies Assess patient/caregiver ability to perform ulcer/skin care regimen upon admission and as needed Assess ulceration(s) every visit Notes: Electronic Signature(s) Signed: 03/11/2018 4:44:09 PM By: Montey Hora Entered By: Montey Hora on 03/11/2018 13:39:14 Royse, [name] ([id number]) -------------------------------------------------------------------------------- Pain Assessment Details Patient Name: Roger Rodriguez Date of Service: 03/11/2018 12:45 PM Medical Record Number: [id number] Patient Account Number: [id number] Date of Birth/Sex: Mar 28, 1941 (77 y.o. Male) Treating RN: Roger Shelter Primary Care Tamiah Dysart: Cranford Mon, Delfino Lovett Other Clinician: Referring Dain Laseter: Kurtis Bushman Treating Sarim Rothman/Extender: Melburn Hake, HOYT Weeks in Treatment: 0 Active Problems Location of Pain Severity and Description of Pain Patient Has Paino Yes Site Locations Pain Location: Generalized Pain Character of Pain Describe the Pain: Aching Pain Management and Medication Current Pain Management: Notes has right knee pain constant Electronic Signature(s) Signed: 03/15/2018 4:40:26 PM By: Roger Shelter Entered By: Roger Shelter on 03/11/2018 13:01:33 Hackert, [name] ([id number]) -------------------------------------------------------------------------------- Wound Assessment Details Patient Name: Roger Rodriguez Date of Service: 03/11/2018 12:45 PM Medical Record Number: [id number] Patient Account Number: [id number] Date of Birth/Sex: 1940/09/14 (77 y.o. Male) Treating RN: Montey Hora Primary Care Ihsan Nomura: Cranford Mon, Delfino Lovett Other Clinician: Referring Staton Markey: Kurtis Bushman Treating Rasheen Schewe/Extender: Melburn Hake, HOYT Weeks in  Treatment: 0 Wound Status Wound Number: 1 Primary Abscess Etiology: Wound Location: Right Knee - Lateral Wound Open Wounding Event: Gradually Appeared Status: Date Acquired: 10/11/2017 Comorbid Cataracts, Chronic sinus problems/congestion, Weeks Of Treatment: 0 History: Lymphedema, Hypertension, Rheumatoid Clustered Wound: No Arthritis Photos Wound Measurements Length: (cm) 1.2 % Reduction Width: (cm) 0.5 % Reduction Depth: (cm) 1 Epitheliali Area: (cm) 0.471 Tunneling: Volume: (cm) 0.471 Positio Maximum in Area: 0% in Volume: 0% zation: None Yes n (o'clock): 3 Distance: (cm) 7 Undermining: No Wound Description Full Thickness With Exposed Support Foul Odor A Classification: Structures Slough/Fibr Wound Margin: Indistinct, nonvisible Exudate Large Amount: Exudate Type: Purulent Exudate Color: yellow, brown, green fter Cleansing: No ino Yes Wound Bed Granulation Amount: Large (67-100%) Exposed Structure Granulation Quality: Red Fascia Exposed: No Necrotic Amount: None Present (0%) Fat Layer (Subcutaneous Tissue) Exposed: Yes Tendon Exposed: No Muscle Exposed: Yes Redford, Raffael E. ([id number]) Necrosis of Muscle: No Joint Exposed: No Bone Exposed: No Periwound Skin Texture Texture Color No Abnormalities Noted: No No Abnormalities Noted: No Callus: No Atrophie Blanche: No Crepitus: No Cyanosis: No Excoriation: No Ecchymosis: No Induration: No Erythema: No Rash: No Hemosiderin Staining: No Scarring: No Mottled: No Pallor: No Moisture Rubor: No No Abnormalities Noted: No Dry / Scaly: No Temperature / Pain Maceration: No Temperature: No Abnormality Tenderness on Palpation: Yes Wound Preparation Ulcer Cleansing: Rinsed/Irrigated with Saline Topical Anesthetic Applied: Other: lidocaine 4%, Electronic Signature(s) Signed: 03/11/2018 4:44:09 PM By: Montey Hora Signed: 03/15/2018 4:40:26 PM By: Roger Shelter Previous Signature: 03/11/2018  2:10:51 PM Version By: Montey Hora Entered By: Roger Shelter on 03/11/2018 15:42:47 Speros, [name] ([id number]) -------------------------------------------------------------------------------- Vitals Details  Patient Name: JAMORRIS, NDIAYE Date of Service: 03/11/2018 12:45 PM Medical Record Number: [id number] Patient Account Number: [id number] Date of Birth/Sex: Nov 29, 1940 (77 y.o. Male) Treating RN: Roger Shelter Primary Care Clementine Soulliere: Cranford Mon, RICHARD Other Clinician: Referring Karista Aispuro: Kurtis Bushman Treating Birdell Frasier/Extender: Melburn Hake, HOYT Weeks in Treatment: 0 Vital Signs Time Taken: 13:01 Temperature (F): 98.2 Height (in): 71 Pulse (bpm): 90 Weight (lbs): 212 Respiratory Rate (breaths/min): 18 Source: Stated Blood Pressure (mmHg): 143/69 Body Mass Index (BMI): 29.6 Reference Range: 80 - 120 mg / dl Electronic Signature(s) Signed: 03/15/2018 4:40:26 PM By: Roger Shelter Entered By: Roger Shelter on 03/11/2018 13:02:20

## 2018-03-17 ENCOUNTER — Inpatient Hospital Stay: Payer: Self-pay

## 2018-03-17 ENCOUNTER — Inpatient Hospital Stay: Payer: PPO

## 2018-03-17 ENCOUNTER — Ambulatory Visit: Payer: Self-pay | Admitting: Orthopedic Surgery

## 2018-03-17 ENCOUNTER — Inpatient Hospital Stay: Payer: PPO | Admitting: Anesthesiology

## 2018-03-17 ENCOUNTER — Encounter
Admission: RE | Disposition: A | Discharge status: Skilled Nursing Facility | Payer: Self-pay | Source: Home / Self Care | Attending: Orthopedic Surgery

## 2018-03-17 ENCOUNTER — Other Ambulatory Visit: Payer: Self-pay

## 2018-03-17 ENCOUNTER — Inpatient Hospital Stay
Admission: RE | Admit: 2018-03-17 | Discharge: 2018-03-21 | DRG: 464 | Disposition: A | Discharge status: Skilled Nursing Facility | Payer: PPO | Attending: Orthopedic Surgery | Admitting: Orthopedic Surgery

## 2018-03-17 DIAGNOSIS — Z8546 Personal history of malignant neoplasm of prostate: Secondary | ICD-10-CM | POA: Diagnosis not present

## 2018-03-17 DIAGNOSIS — Z87442 Personal history of urinary calculi: Secondary | ICD-10-CM

## 2018-03-17 DIAGNOSIS — B372 Candidiasis of skin and nail: Secondary | ICD-10-CM | POA: Diagnosis not present

## 2018-03-17 DIAGNOSIS — R52 Pain, unspecified: Secondary | ICD-10-CM | POA: Diagnosis not present

## 2018-03-17 DIAGNOSIS — T8459XA Infection and inflammatory reaction due to other internal joint prosthesis, initial encounter: Secondary | ICD-10-CM | POA: Diagnosis not present

## 2018-03-17 DIAGNOSIS — Z471 Aftercare following joint replacement surgery: Secondary | ICD-10-CM | POA: Diagnosis not present

## 2018-03-17 DIAGNOSIS — G20 Parkinson's disease: Secondary | ICD-10-CM | POA: Diagnosis not present

## 2018-03-17 DIAGNOSIS — Z960 Presence of urogenital implants: Secondary | ICD-10-CM | POA: Diagnosis not present

## 2018-03-17 DIAGNOSIS — N3946 Mixed incontinence: Secondary | ICD-10-CM | POA: Diagnosis present

## 2018-03-17 DIAGNOSIS — Z96659 Presence of unspecified artificial knee joint: Secondary | ICD-10-CM

## 2018-03-17 DIAGNOSIS — Z8052 Family history of malignant neoplasm of bladder: Secondary | ICD-10-CM | POA: Diagnosis not present

## 2018-03-17 DIAGNOSIS — E785 Hyperlipidemia, unspecified: Secondary | ICD-10-CM | POA: Diagnosis present

## 2018-03-17 DIAGNOSIS — N451 Epididymitis: Secondary | ICD-10-CM | POA: Diagnosis not present

## 2018-03-17 DIAGNOSIS — N5089 Other specified disorders of the male genital organs: Secondary | ICD-10-CM | POA: Diagnosis not present

## 2018-03-17 DIAGNOSIS — L304 Erythema intertrigo: Secondary | ICD-10-CM | POA: Diagnosis present

## 2018-03-17 DIAGNOSIS — I129 Hypertensive chronic kidney disease with stage 1 through stage 4 chronic kidney disease, or unspecified chronic kidney disease: Secondary | ICD-10-CM | POA: Diagnosis not present

## 2018-03-17 DIAGNOSIS — E869 Volume depletion, unspecified: Secondary | ICD-10-CM | POA: Diagnosis not present

## 2018-03-17 DIAGNOSIS — Z87891 Personal history of nicotine dependence: Secondary | ICD-10-CM

## 2018-03-17 DIAGNOSIS — T8453XD Infection and inflammatory reaction due to internal right knee prosthesis, subsequent encounter: Secondary | ICD-10-CM | POA: Diagnosis not present

## 2018-03-17 DIAGNOSIS — N453 Epididymo-orchitis: Secondary | ICD-10-CM | POA: Diagnosis present

## 2018-03-17 DIAGNOSIS — T8453XA Infection and inflammatory reaction due to internal right knee prosthesis, initial encounter: Secondary | ICD-10-CM | POA: Diagnosis not present

## 2018-03-17 DIAGNOSIS — Z7401 Bed confinement status: Secondary | ICD-10-CM | POA: Diagnosis not present

## 2018-03-17 DIAGNOSIS — Z09 Encounter for follow-up examination after completed treatment for conditions other than malignant neoplasm: Secondary | ICD-10-CM

## 2018-03-17 DIAGNOSIS — B964 Proteus (mirabilis) (morganii) as the cause of diseases classified elsewhere: Secondary | ICD-10-CM | POA: Diagnosis present

## 2018-03-17 DIAGNOSIS — N189 Chronic kidney disease, unspecified: Secondary | ICD-10-CM | POA: Diagnosis present

## 2018-03-17 DIAGNOSIS — L02415 Cutaneous abscess of right lower limb: Secondary | ICD-10-CM | POA: Diagnosis not present

## 2018-03-17 DIAGNOSIS — Z9079 Acquired absence of other genital organ(s): Secondary | ICD-10-CM

## 2018-03-17 DIAGNOSIS — E568 Deficiency of other vitamins: Secondary | ICD-10-CM | POA: Diagnosis not present

## 2018-03-17 DIAGNOSIS — Y792 Prosthetic and other implants, materials and accessory orthopedic devices associated with adverse incidents: Secondary | ICD-10-CM | POA: Diagnosis present

## 2018-03-17 DIAGNOSIS — Z800 Family history of malignant neoplasm of digestive organs: Secondary | ICD-10-CM | POA: Diagnosis not present

## 2018-03-17 DIAGNOSIS — Z978 Presence of other specified devices: Secondary | ICD-10-CM | POA: Diagnosis not present

## 2018-03-17 DIAGNOSIS — N472 Paraphimosis: Secondary | ICD-10-CM | POA: Diagnosis not present

## 2018-03-17 DIAGNOSIS — N179 Acute kidney failure, unspecified: Secondary | ICD-10-CM | POA: Diagnosis not present

## 2018-03-17 DIAGNOSIS — I10 Essential (primary) hypertension: Secondary | ICD-10-CM | POA: Diagnosis not present

## 2018-03-17 DIAGNOSIS — R1312 Dysphagia, oropharyngeal phase: Secondary | ICD-10-CM | POA: Diagnosis not present

## 2018-03-17 DIAGNOSIS — Z9181 History of falling: Secondary | ICD-10-CM

## 2018-03-17 DIAGNOSIS — G2581 Restless legs syndrome: Secondary | ICD-10-CM | POA: Diagnosis not present

## 2018-03-17 DIAGNOSIS — I959 Hypotension, unspecified: Secondary | ICD-10-CM | POA: Diagnosis not present

## 2018-03-17 DIAGNOSIS — Z96651 Presence of right artificial knee joint: Secondary | ICD-10-CM | POA: Diagnosis not present

## 2018-03-17 DIAGNOSIS — I9581 Postprocedural hypotension: Secondary | ICD-10-CM | POA: Diagnosis not present

## 2018-03-17 DIAGNOSIS — R262 Difficulty in walking, not elsewhere classified: Secondary | ICD-10-CM | POA: Diagnosis not present

## 2018-03-17 DIAGNOSIS — N492 Inflammatory disorders of scrotum: Secondary | ICD-10-CM | POA: Diagnosis not present

## 2018-03-17 DIAGNOSIS — R471 Dysarthria and anarthria: Secondary | ICD-10-CM | POA: Diagnosis not present

## 2018-03-17 DIAGNOSIS — J309 Allergic rhinitis, unspecified: Secondary | ICD-10-CM | POA: Diagnosis not present

## 2018-03-17 DIAGNOSIS — N3281 Overactive bladder: Secondary | ICD-10-CM | POA: Diagnosis not present

## 2018-03-17 DIAGNOSIS — Z8619 Personal history of other infectious and parasitic diseases: Secondary | ICD-10-CM | POA: Diagnosis not present

## 2018-03-17 DIAGNOSIS — M6281 Muscle weakness (generalized): Secondary | ICD-10-CM | POA: Diagnosis not present

## 2018-03-17 DIAGNOSIS — N471 Phimosis: Secondary | ICD-10-CM | POA: Diagnosis not present

## 2018-03-17 HISTORY — PX: TOTAL KNEE REVISION: SHX996

## 2018-03-17 LAB — BASIC METABOLIC PANEL
Anion gap: 7 (ref 5–15)
BUN: 23 mg/dL (ref 8–23)
CO2: 28 mmol/L (ref 22–32)
Calcium: 8.9 mg/dL (ref 8.9–10.3)
Chloride: 100 mmol/L (ref 98–111)
Creatinine, Ser: 0.99 mg/dL (ref 0.61–1.24)
GFR calc Af Amer: >60 mL/min (ref >60)
GFR calc non Af Amer: >60 mL/min (ref >60)
Glucose, Bld: 171 mg/dL — ABNORMAL HIGH (ref 70–99)
Potassium: 4.5 mmol/L (ref 3.5–5.1)
Sodium: 135 mmol/L (ref 135–145)

## 2018-03-17 LAB — PROTIME-INR
INR: 1.08
Prothrombin Time: 13.9 seconds (ref 11.4–15.2)

## 2018-03-17 LAB — CBC
HCT: 36.7 % — ABNORMAL LOW (ref 40.0–52.0)
Hemoglobin: 12.3 g/dL — ABNORMAL LOW (ref 13.0–18.0)
MCH: 30 pg (ref 26.0–34.0)
MCHC: 33.6 g/dL (ref 32.0–36.0)
MCV: 89.5 fL (ref 80.0–100.0)
Platelets: 286 10*3/uL (ref 150–440)
RBC: 4.1 MIL/uL — ABNORMAL LOW (ref 4.40–5.90)
RDW: 14.9 % — ABNORMAL HIGH (ref 11.5–14.5)
WBC: 7.9 10*3/uL (ref 3.8–10.6)

## 2018-03-17 LAB — APTT: aPTT: 31 seconds (ref 24–36)

## 2018-03-17 SURGERY — TOTAL KNEE REVISION
Anesthesia: General | Site: Knee | Laterality: Right | Wound class: Dirty or Infected

## 2018-03-17 MED ORDER — PROPOFOL 500 MG/50ML IV EMUL
INTRAVENOUS | Status: AC
  Filled 2018-03-17: qty 50

## 2018-03-17 MED ORDER — HYDROCODONE-ACETAMINOPHEN 5-325 MG PO TABS
1.0000 | ORAL_TABLET | ORAL | Status: DC | PRN
  Administered 2018-03-17: 2 via ORAL
  Administered 2018-03-18 (×2): 1 via ORAL
  Administered 2018-03-19: 2 via ORAL
  Administered 2018-03-19: 1 via ORAL
  Administered 2018-03-20 (×2): 2 via ORAL
  Administered 2018-03-21 (×3): 1 via ORAL
  Filled 2018-03-17 (×2): qty 2
  Filled 2018-03-17: qty 1
  Filled 2018-03-17: qty 2
  Filled 2018-03-17: qty 1
  Filled 2018-03-17: qty 2
  Filled 2018-03-17 (×5): qty 1

## 2018-03-17 MED ORDER — BACITRACIN 50000 UNITS IM SOLR
INTRAMUSCULAR | Status: AC
  Filled 2018-03-17: qty 1

## 2018-03-17 MED ORDER — METOCLOPRAMIDE HCL 5 MG/ML IJ SOLN: 5.0000 mg | Freq: Three times a day (TID) | INTRAMUSCULAR | Status: DC | PRN

## 2018-03-17 MED ORDER — ONDANSETRON HCL 4 MG/2ML IJ SOLN: 4.0000 mg | Freq: Four times a day (QID) | INTRAMUSCULAR | Status: DC | PRN

## 2018-03-17 MED ORDER — VITAMIN C 500 MG PO TABS
500.0000 mg | ORAL_TABLET | Freq: Every day | ORAL | Status: DC
  Administered 2018-03-17 – 2018-03-21 (×4): 500 mg via ORAL
  Filled 2018-03-17 (×5): qty 1

## 2018-03-17 MED ORDER — LIDOCAINE HCL (CARDIAC) PF 100 MG/5ML IV SOSY
PREFILLED_SYRINGE | INTRAVENOUS | Status: DC | PRN
  Administered 2018-03-17: 60 mg via INTRAVENOUS

## 2018-03-17 MED ORDER — ACETAMINOPHEN 500 MG PO TABS
ORAL_TABLET | ORAL | Status: AC
  Filled 2018-03-17: qty 2

## 2018-03-17 MED ORDER — PHENOL 1.4 % MT LIQD
1.0000 | OROMUCOSAL | Status: DC | PRN
  Filled 2018-03-17: qty 177

## 2018-03-17 MED ORDER — HYDROMORPHONE HCL 1 MG/ML IJ SOLN: 0.2000 mg | INTRAMUSCULAR | Status: DC | PRN

## 2018-03-17 MED ORDER — CEFAZOLIN SODIUM-DEXTROSE 2-3 GM-%(50ML) IV SOLR
INTRAVENOUS | Status: DC | PRN
  Administered 2018-03-17: 2 g via INTRAVENOUS

## 2018-03-17 MED ORDER — MIDAZOLAM HCL 2 MG/2ML IJ SOLN
INTRAMUSCULAR | Status: AC
  Filled 2018-03-17: qty 2

## 2018-03-17 MED ORDER — VITAMIN D 1000 UNITS PO TABS
1000.0000 [IU] | ORAL_TABLET | Freq: Every day | ORAL | Status: DC
  Administered 2018-03-19 – 2018-03-21 (×3): 1000 [IU] via ORAL
  Filled 2018-03-17 (×3): qty 1

## 2018-03-17 MED ORDER — LIDOCAINE HCL (PF) 2 % IJ SOLN
INTRAMUSCULAR | Status: AC
  Filled 2018-03-17: qty 10

## 2018-03-17 MED ORDER — CARBIDOPA-LEVODOPA ER 23.75-95 MG PO CPCR
3.0000 | ORAL_CAPSULE | Freq: Four times a day (QID) | ORAL | Status: DC
  Administered 2018-03-17 – 2018-03-21 (×13): 3 via ORAL
  Filled 2018-03-17 (×11): qty 100
  Filled 2018-03-17: qty 3
  Filled 2018-03-17 (×3): qty 100

## 2018-03-17 MED ORDER — DOCUSATE SODIUM 100 MG PO CAPS
100.0000 mg | ORAL_CAPSULE | Freq: Two times a day (BID) | ORAL | Status: DC
  Administered 2018-03-17 – 2018-03-21 (×8): 100 mg via ORAL
  Filled 2018-03-17 (×8): qty 1

## 2018-03-17 MED ORDER — MORPHINE SULFATE (PF) 2 MG/ML IV SOLN: 0.5000 mg | INTRAVENOUS | Status: DC | PRN

## 2018-03-17 MED ORDER — MAGNESIUM HYDROXIDE 400 MG/5ML PO SUSP: 30.0000 mL | Freq: Every day | ORAL | Status: DC | PRN

## 2018-03-17 MED ORDER — VITAMIN E 180 MG (400 UNIT) PO CAPS
400.0000 [IU] | ORAL_CAPSULE | Freq: Every day | ORAL | Status: DC
  Administered 2018-03-17 – 2018-03-21 (×4): 400 [IU] via ORAL
  Filled 2018-03-17 (×5): qty 1

## 2018-03-17 MED ORDER — CHLORHEXIDINE GLUCONATE 4 % EX LIQD: 60.0000 mL | Freq: Once | CUTANEOUS | Status: DC

## 2018-03-17 MED ORDER — SUGAMMADEX SODIUM 200 MG/2ML IV SOLN
INTRAVENOUS | Status: DC | PRN
  Administered 2018-03-17: 200 mg via INTRAVENOUS

## 2018-03-17 MED ORDER — FLUTICASONE PROPIONATE 50 MCG/ACT NA SUSP
1.0000 | Freq: Two times a day (BID) | NASAL | Status: DC | PRN
  Filled 2018-03-17: qty 16

## 2018-03-17 MED ORDER — HYDROCODONE-ACETAMINOPHEN 7.5-325 MG PO TABS: 1.0000 | ORAL_TABLET | ORAL | Status: DC | PRN

## 2018-03-17 MED ORDER — LORATADINE 10 MG PO TABS
10.0000 mg | ORAL_TABLET | Freq: Every day | ORAL | Status: DC
  Administered 2018-03-17 – 2018-03-21 (×5): 10 mg via ORAL
  Filled 2018-03-17 (×5): qty 1

## 2018-03-17 MED ORDER — MIRABEGRON ER 25 MG PO TB24
25.0000 mg | ORAL_TABLET | Freq: Two times a day (BID) | ORAL | Status: DC
  Administered 2018-03-17 – 2018-03-21 (×6): 25 mg via ORAL
  Filled 2018-03-17 (×11): qty 1

## 2018-03-17 MED ORDER — POLYVINYL ALCOHOL 1.4 % OP SOLN
1.0000 [drp] | OPHTHALMIC | Status: DC | PRN
  Filled 2018-03-17: qty 15

## 2018-03-17 MED ORDER — FENTANYL CITRATE (PF) 100 MCG/2ML IJ SOLN
25.0000 ug | INTRAMUSCULAR | Status: DC | PRN
  Administered 2018-03-17 (×2): 25 ug via INTRAVENOUS

## 2018-03-17 MED ORDER — SODIUM CHLORIDE FLUSH 0.9 % IV SOLN
INTRAVENOUS | Status: AC
  Filled 2018-03-17: qty 40

## 2018-03-17 MED ORDER — CARBIDOPA-LEVODOPA ER 23.75-95 MG PO CPCR: 3.0000 | ORAL_CAPSULE | Freq: Four times a day (QID) | ORAL | Status: DC

## 2018-03-17 MED ORDER — DEXAMETHASONE SODIUM PHOSPHATE 10 MG/ML IJ SOLN
INTRAMUSCULAR | Status: AC
  Filled 2018-03-17: qty 1

## 2018-03-17 MED ORDER — KETAMINE HCL 10 MG/ML IJ SOLN
INTRAMUSCULAR | Status: DC | PRN
  Administered 2018-03-17: 50 mg via INTRAVENOUS

## 2018-03-17 MED ORDER — CEFAZOLIN SODIUM 1 G IJ SOLR
INTRAMUSCULAR | Status: AC
  Filled 2018-03-17: qty 20

## 2018-03-17 MED ORDER — DEXAMETHASONE SODIUM PHOSPHATE 10 MG/ML IJ SOLN
INTRAMUSCULAR | Status: DC | PRN
  Administered 2018-03-17: 5 mg via INTRAVENOUS

## 2018-03-17 MED ORDER — ACETAMINOPHEN 500 MG PO TABS
1000.0000 mg | ORAL_TABLET | Freq: Once | ORAL | Status: AC
  Administered 2018-03-17: 1000 mg via ORAL

## 2018-03-17 MED ORDER — GABAPENTIN 400 MG PO CAPS
400.0000 mg | ORAL_CAPSULE | Freq: Three times a day (TID) | ORAL | Status: DC
  Administered 2018-03-17 – 2018-03-21 (×10): 400 mg via ORAL
  Filled 2018-03-17 (×11): qty 1

## 2018-03-17 MED ORDER — TRANEXAMIC ACID 1000 MG/10ML IV SOLN
1000.0000 mg | INTRAVENOUS | Status: AC
  Administered 2018-03-17: 1000 mg via INTRAVENOUS
  Filled 2018-03-17: qty 1000

## 2018-03-17 MED ORDER — PIPERACILLIN-TAZOBACTAM 3.375 G IVPB
3.3750 g | Freq: Three times a day (TID) | INTRAVENOUS | Status: DC
  Administered 2018-03-17 – 2018-03-21 (×11): 3.375 g via INTRAVENOUS
  Filled 2018-03-17 (×11): qty 50

## 2018-03-17 MED ORDER — BISACODYL 10 MG RE SUPP
10.0000 mg | Freq: Every day | RECTAL | Status: DC | PRN
  Administered 2018-03-20: 10 mg via RECTAL
  Filled 2018-03-17: qty 1

## 2018-03-17 MED ORDER — ROCURONIUM BROMIDE 100 MG/10ML IV SOLN
INTRAVENOUS | Status: DC | PRN
  Administered 2018-03-17: 50 mg via INTRAVENOUS

## 2018-03-17 MED ORDER — MENTHOL 3 MG MT LOZG
1.0000 | LOZENGE | OROMUCOSAL | Status: DC | PRN
  Filled 2018-03-17: qty 9

## 2018-03-17 MED ORDER — FENTANYL CITRATE (PF) 100 MCG/2ML IJ SOLN
INTRAMUSCULAR | Status: DC | PRN
  Administered 2018-03-17 (×2): 50 ug via INTRAVENOUS

## 2018-03-17 MED ORDER — ACETAMINOPHEN 500 MG PO TABS
500.0000 mg | ORAL_TABLET | Freq: Four times a day (QID) | ORAL | Status: AC
  Administered 2018-03-17 – 2018-03-18 (×3): 500 mg via ORAL
  Filled 2018-03-17 (×3): qty 1

## 2018-03-17 MED ORDER — SODIUM CHLORIDE 0.9 % IR SOLN
Status: DC | PRN
  Administered 2018-03-17: 1000 mL

## 2018-03-17 MED ORDER — ONDANSETRON HCL 4 MG PO TABS: 4.0000 mg | ORAL_TABLET | Freq: Four times a day (QID) | ORAL | Status: DC | PRN

## 2018-03-17 MED ORDER — KETAMINE HCL 50 MG/ML IJ SOLN
INTRAMUSCULAR | Status: AC
  Filled 2018-03-17: qty 10

## 2018-03-17 MED ORDER — METOCLOPRAMIDE HCL 10 MG PO TABS: 5.0000 mg | ORAL_TABLET | Freq: Three times a day (TID) | ORAL | Status: DC | PRN

## 2018-03-17 MED ORDER — BUPIVACAINE LIPOSOME 1.3 % IJ SUSP
INTRAMUSCULAR | Status: AC
  Filled 2018-03-17: qty 20

## 2018-03-17 MED ORDER — MINERAL OIL LIGHT 100 % EX OIL
TOPICAL_OIL | CUTANEOUS | Status: AC
  Filled 2018-03-17: qty 25

## 2018-03-17 MED ORDER — ACETAMINOPHEN 10 MG/ML IV SOLN
INTRAVENOUS | Status: AC
  Filled 2018-03-17: qty 100

## 2018-03-17 MED ORDER — ROPINIROLE HCL 1 MG PO TABS
0.5000 mg | ORAL_TABLET | Freq: Every day | ORAL | Status: DC
  Administered 2018-03-17: 0.5 mg via ORAL
  Filled 2018-03-17: qty 1

## 2018-03-17 MED ORDER — FENTANYL CITRATE (PF) 100 MCG/2ML IJ SOLN
INTRAMUSCULAR | Status: AC
  Administered 2018-03-17: 25 ug via INTRAVENOUS
  Filled 2018-03-17: qty 2

## 2018-03-17 MED ORDER — BUPIVACAINE-EPINEPHRINE (PF) 0.5% -1:200000 IJ SOLN
INTRAMUSCULAR | Status: AC
  Filled 2018-03-17: qty 30

## 2018-03-17 MED ORDER — MAGNESIUM CITRATE PO SOLN
1.0000 | Freq: Once | ORAL | Status: DC | PRN
  Filled 2018-03-17: qty 296

## 2018-03-17 MED ORDER — ONDANSETRON HCL 4 MG/2ML IJ SOLN
INTRAMUSCULAR | Status: AC
  Filled 2018-03-17: qty 2

## 2018-03-17 MED ORDER — LACTATED RINGERS IV SOLN
INTRAVENOUS | Status: DC
  Administered 2018-03-17: 12:00:00 via INTRAVENOUS

## 2018-03-17 MED ORDER — HYDROMORPHONE HCL 1 MG/ML IJ SOLN
INTRAMUSCULAR | Status: AC
  Filled 2018-03-17: qty 1

## 2018-03-17 MED ORDER — HYDROMORPHONE HCL 1 MG/ML IJ SOLN
INTRAMUSCULAR | Status: DC | PRN
  Administered 2018-03-17 (×2): .25 mg via INTRAVENOUS

## 2018-03-17 MED ORDER — ASPIRIN 81 MG PO CHEW
81.0000 mg | CHEWABLE_TABLET | Freq: Two times a day (BID) | ORAL | Status: DC
  Administered 2018-03-17 – 2018-03-21 (×8): 81 mg via ORAL
  Filled 2018-03-17 (×8): qty 1

## 2018-03-17 MED ORDER — VITAMIN B-12 1000 MCG PO TABS
1000.0000 ug | ORAL_TABLET | Freq: Every day | ORAL | Status: DC
  Administered 2018-03-17 – 2018-03-21 (×4): 1000 ug via ORAL
  Filled 2018-03-17 (×4): qty 1

## 2018-03-17 MED ORDER — EPHEDRINE SULFATE 50 MG/ML IJ SOLN
INTRAMUSCULAR | Status: DC | PRN
  Administered 2018-03-17: 10 mg via INTRAVENOUS

## 2018-03-17 MED ORDER — KETOROLAC TROMETHAMINE 15 MG/ML IJ SOLN
7.5000 mg | Freq: Four times a day (QID) | INTRAMUSCULAR | Status: AC
  Administered 2018-03-17 – 2018-03-18 (×4): 7.5 mg via INTRAVENOUS
  Filled 2018-03-17 (×4): qty 1

## 2018-03-17 MED ORDER — PRAMIPEXOLE DIHYDROCHLORIDE 0.25 MG PO TABS
0.2500 mg | ORAL_TABLET | Freq: Two times a day (BID) | ORAL | Status: DC
  Administered 2018-03-17 – 2018-03-21 (×8): 0.25 mg via ORAL
  Filled 2018-03-17 (×9): qty 1

## 2018-03-17 MED ORDER — ACETAMINOPHEN 325 MG PO TABS
325.0000 mg | ORAL_TABLET | Freq: Four times a day (QID) | ORAL | Status: DC | PRN
  Filled 2018-03-17: qty 2

## 2018-03-17 MED ORDER — LISINOPRIL 5 MG PO TABS
2.5000 mg | ORAL_TABLET | Freq: Two times a day (BID) | ORAL | Status: DC
  Administered 2018-03-17: 2.5 mg via ORAL
  Filled 2018-03-17: qty 1

## 2018-03-17 MED ORDER — ROCURONIUM BROMIDE 50 MG/5ML IV SOLN
INTRAVENOUS | Status: AC
  Filled 2018-03-17: qty 1

## 2018-03-17 MED ORDER — FENTANYL CITRATE (PF) 100 MCG/2ML IJ SOLN
INTRAMUSCULAR | Status: AC
  Filled 2018-03-17: qty 2

## 2018-03-17 MED ORDER — ACETAMINOPHEN 10 MG/ML IV SOLN
INTRAVENOUS | Status: DC | PRN
  Administered 2018-03-17: 1000 mg via INTRAVENOUS

## 2018-03-17 MED ORDER — PROPOFOL 10 MG/ML IV BOLUS
INTRAVENOUS | Status: DC | PRN
  Administered 2018-03-17: 130 mg via INTRAVENOUS

## 2018-03-17 MED ORDER — LACTATED RINGERS IV SOLN
INTRAVENOUS | Status: DC
  Administered 2018-03-17 – 2018-03-18 (×2): via INTRAVENOUS

## 2018-03-17 MED ORDER — VANCOMYCIN HCL IN DEXTROSE 1-5 GM/200ML-% IV SOLN
1000.0000 mg | Freq: Two times a day (BID) | INTRAVENOUS | Status: AC
  Administered 2018-03-17: 1000 mg via INTRAVENOUS
  Filled 2018-03-17: qty 200

## 2018-03-17 MED ORDER — ONDANSETRON HCL 4 MG/2ML IJ SOLN
INTRAMUSCULAR | Status: DC | PRN
  Administered 2018-03-17: 4 mg via INTRAVENOUS

## 2018-03-17 MED ORDER — PHENYLEPHRINE HCL 10 MG/ML IJ SOLN
INTRAMUSCULAR | Status: DC | PRN
  Administered 2018-03-17: 100 ug via INTRAVENOUS
  Administered 2018-03-17: 200 ug via INTRAVENOUS
  Administered 2018-03-17: 50 ug via INTRAVENOUS
  Administered 2018-03-17: 100 ug via INTRAVENOUS
  Administered 2018-03-17 (×2): 200 ug via INTRAVENOUS
  Administered 2018-03-17: 100 ug via INTRAVENOUS
  Administered 2018-03-17: 200 ug via INTRAVENOUS
  Administered 2018-03-17: 50 ug via INTRAVENOUS

## 2018-03-17 SURGICAL SUPPLY — 54 items
BRUSH SCRUB EZ  4% CHG (MISCELLANEOUS) ×2
BRUSH SCRUB EZ 4% CHG (MISCELLANEOUS) ×2 IMPLANT
CANISTER SUCT 1200ML W/VALVE (MISCELLANEOUS) ×2 IMPLANT
CANISTER SUCT 3000ML PPV (MISCELLANEOUS) ×4 IMPLANT
CEMENT BONE GENTAMICIN (Cement) ×10 IMPLANT
CEMENT BONE GENTAMICIN PWDR (Cement) ×5 IMPLANT
CHLORAPREP W/TINT 26ML (MISCELLANEOUS) ×4 IMPLANT
COOLER POLAR GLACIER W/PUMP (MISCELLANEOUS) ×2 IMPLANT
CUFF TOURN 24 STER (MISCELLANEOUS) IMPLANT
CUFF TOURN 30 STER DUAL PORT (MISCELLANEOUS) IMPLANT
DRAPE INCISE IOBAN 66X60 STRL (DRAPES) ×2 IMPLANT
DRAPE SHEET LG 3/4 BI-LAMINATE (DRAPES) ×2 IMPLANT
DRSG AQUACEL AG ADV 3.5X14 (GAUZE/BANDAGES/DRESSINGS) ×2 IMPLANT
FEMORAL MOLD SPACER ×2 IMPLANT
GAUZE PETRO XEROFOAM 1X8 (MISCELLANEOUS) ×2 IMPLANT
GLOVE INDICATOR 8.0 STRL GRN (GLOVE) ×2 IMPLANT
GLOVE SURG ORTHO 8.0 STRL STRW (GLOVE) ×4 IMPLANT
GOWN STRL REUS W/ TWL LRG LVL3 (GOWN DISPOSABLE) ×2 IMPLANT
GOWN STRL REUS W/ TWL XL LVL3 (GOWN DISPOSABLE) ×1 IMPLANT
GOWN STRL REUS W/TWL LRG LVL3 (GOWN DISPOSABLE) ×2
GOWN STRL REUS W/TWL XL LVL3 (GOWN DISPOSABLE) ×1
HOLDER FOLEY CATH W/STRAP (MISCELLANEOUS) ×2 IMPLANT
HOOD PEEL AWAY FLYTE STAYCOOL (MISCELLANEOUS) ×6 IMPLANT
IV NS 1000ML (IV SOLUTION) ×1
IV NS 1000ML BAXH (IV SOLUTION) ×1 IMPLANT
KIT TURNOVER KIT A (KITS) ×2 IMPLANT
LABEL OR SOLS (LABEL) ×2 IMPLANT
MAT ABSORB  FLUID 56X50 GRAY (MISCELLANEOUS) ×1
MAT ABSORB FLUID 56X50 GRAY (MISCELLANEOUS) ×1 IMPLANT
MOLD SPACER FEM KNEE 53A/PX75M (Spacer) ×1 IMPLANT
NDL SAFETY ECLIPSE 18X1.5 (NEEDLE) ×1 IMPLANT
NEEDLE HYPO 18GX1.5 SHARP (NEEDLE) ×1
NEEDLE SPNL 20GX3.5 QUINCKE YW (NEEDLE) ×2 IMPLANT
NS IRRIG 1000ML POUR BTL (IV SOLUTION) ×2 IMPLANT
PACK TOTAL KNEE (MISCELLANEOUS) ×2 IMPLANT
PAD DE MAYO PRESSURE PROTECT (MISCELLANEOUS) ×2 IMPLANT
PAD WRAPON POLAR KNEE (MISCELLANEOUS) ×1 IMPLANT
PULSAVAC PLUS IRRIG FAN TIP (DISPOSABLE) ×2
SPACER KASM MOLD44APX70ML KNEE (Spacer) ×2 IMPLANT
SPACERMOLD FEM KNEE 53A/PX75M (Spacer) ×2 IMPLANT
STAPLER SKIN PROX 35W (STAPLE) ×2 IMPLANT
SUCTION FRAZIER HANDLE 10FR (MISCELLANEOUS) ×1
SUCTION TUBE FRAZIER 10FR DISP (MISCELLANEOUS) ×1 IMPLANT
SUT DVC 2 QUILL PDO  T11 36X36 (SUTURE) ×1
SUT DVC 2 QUILL PDO T11 36X36 (SUTURE) ×1 IMPLANT
SUT VIC AB 2-0 CT1 18 (SUTURE) ×2 IMPLANT
SUT VIC AB 2-0 CT1 27 (SUTURE) ×1
SUT VIC AB 2-0 CT1 TAPERPNT 27 (SUTURE) ×1 IMPLANT
SUT VIC AB PLUS 45CM 1-MO-4 (SUTURE) ×2 IMPLANT
SYR 30ML LL (SYRINGE) ×4 IMPLANT
TIP FAN IRRIG PULSAVAC PLUS (DISPOSABLE) ×1 IMPLANT
TRAY FOLEY MTR SLVR 16FR STAT (SET/KITS/TRAYS/PACK) ×2 IMPLANT
WRAPON POLAR PAD KNEE (MISCELLANEOUS) ×2
tibial mold spacer ×2 IMPLANT

## 2018-03-17 NOTE — Telephone Encounter (Signed)
Appointment being made

## 2018-03-17 NOTE — OR Nursing (Signed)
EXPLANTED FEMORAL AND TIBIAL COMPONENTS OF RIGHT KNEE

## 2018-03-17 NOTE — Transfer of Care (Signed)
Immediate Anesthesia Transfer of Care Note  Patient: [name]  Procedure(s) Performed: TOTAL KNEE REVISION (Right Knee)  Patient Location: PACU  Anesthesia Type:General  Level of Consciousness: sedated  Airway & Oxygen Therapy: Patient Spontanous Breathing and Patient connected to face mask oxygen  Post-op Assessment: Report given to RN and Post -op Vital signs reviewed and stable  Post vital signs: Reviewed and stable  Last Vitals:  Vitals Value Taken Time  BP 102/67 03/17/2018  4:32 PM  Temp 37 C 03/17/2018  4:32 PM  Pulse 93 03/17/2018  4:36 PM  Resp 12 03/17/2018  4:36 PM  SpO2 96 % 03/17/2018  4:36 PM  Vitals shown include unvalidated device data.  Last Pain:  Vitals:   03/17/18 1632  TempSrc:   PainSc: (P) 0-No pain         Complications: No apparent anesthesia complications

## 2018-03-17 NOTE — Anesthesia Procedure Notes (Addendum)
Procedure Name: Intubation Date/Time: 03/17/2018 1:31 PM Performed by: Corky Crafts, RN Pre-anesthesia Checklist: Patient identified, Emergency Drugs available, Suction available, Patient being monitored and Timeout performed Patient Re-evaluated:Patient Re-evaluated prior to induction Oxygen Delivery Method: Circle system utilized Preoxygenation: Pre-oxygenation with 100% oxygen Induction Type: IV induction Ventilation: Oral airway inserted - appropriate to patient size and Mask ventilation without difficulty Laryngoscope Size: Mac and 4 Grade View: Grade II Tube type: Oral Tube size: 7.5 mm Number of attempts: 1 Airway Equipment and Method: Stylet and LTA kit utilized Placement Confirmation: ETT inserted through vocal cords under direct vision,  positive ETCO2 and breath sounds checked- equal and bilateral Secured at: 22 cm Tube secured with: Tape Dental Injury: Teeth and Oropharynx as per pre-operative assessment

## 2018-03-17 NOTE — Anesthesia Post-op Follow-up Note (Signed)
Anesthesia QCDR form completed.        

## 2018-03-17 NOTE — H&P (Signed)
The patient has been re-examined, and the chart reviewed, and there have been no interval changes to the documented history and physical.  Plan a right total knee removal today and placement of antibiotic spacer and wound VAC today.  Anesthesia is not consulted regarding a peripheral nerve block for post-operative pain.  The risks, benefits, and alternatives have been discussed at length, and the patient is willing to proceed.

## 2018-03-17 NOTE — Op Note (Signed)
  03/17/2018  4:52 PM  PATIENT:  Roger Rodriguez    PRE-OPERATIVE DIAGNOSIS:  PROSTHETIC JOINT INFECTION  POST-OPERATIVE DIAGNOSIS:  Same  PROCEDURE:  TOTAL KNEE REMOVAL OF COMPONENTS, PLACEMENT OF ANTIBIOTIC SPACER, PLACEMENT OF WOUND Vac, RIGHT KNEE  SURGEON:  Lovell Sheehan, MD   ASSIST: Carlynn Spry, PA-C  ANESTHESIA:   General  PREOPERATIVE INDICATIONS:  Roger Rodriguez is a  77 y.o. male with a diagnosis of PROSTHETIC JOINT INFECTION who  elected for surgical management of the infection with a two stage reconstruction. He also has a draining lateral, proximal leg wound.   The risks benefits and alternatives were discussed with the patient preoperatively including but not limited to the risks of infection, bleeding, nerve injury, cardiopulmonary complications, the need for revision surgery, among others, and the patient was willing to proceed.  EBL: 150 CC  TOURNIQUET TIME: 87 MIN  OPERATIVE IMPLANTS: antibiotic cement spacer on the femur and tibia, Wound vac sponge placed 6 cm in to wound  OPERATIVE FINDINGS: Grossly infected total knee with purulent material throughout the knee, connecting with lateral leg wound  OPERATIVE PROCEDURE:   After informed consent was obtained and the appropriate extremity marked in the pre-operative holding area, the patient was taken to the operating room and placed in the supine position. General anesthesia was induced and the right lower extremity was prepped and draped in standard sterile fashion. The extremity was exsanguinated and the tourniquet insufflated to 275 mmHg. The prior incision was utilized. Sharp dissection was taken down to the patella. A medial parapatellar approach was utilized. The medial and lateral gutters were debrided of infected tissue. The components were removed with osteotomes and a mallet. The cement was debrided. The wound was irrigated with over 4 liters of bacitracin laced normal saline. The cement spacer was prepared  on the back table using the large femoral mold and medium tibial mold. The wound was closed in layers and a sterile dressing applied. The lateral wound was debrided sharply with scalpel and rongeur. A wound vac sponge was placed. She tolerated the procedure well and was taken to the recovery room in good condition.   Roger Rodriguez. Roger Mares, MD

## 2018-03-17 NOTE — Anesthesia Preprocedure Evaluation (Addendum)
Anesthesia Evaluation  Patient identified by MRN, date of birth, ID band Patient awake    Reviewed: Allergy & Precautions, NPO status , Patient's Chart, lab work & pertinent test results  History of Anesthesia Complications (+) Family history of anesthesia reaction  Airway Mallampati: III       Dental   Pulmonary neg sleep apnea, neg COPD, former smoker,           Cardiovascular hypertension, Pt. on medications (-) Past MI and (-) CHF (-) dysrhythmias (-) Valvular Problems/Murmurs     Neuro/Psych neg Seizures Depression    GI/Hepatic neg GERD  ,(+) Hepatitis -, A  Endo/Other  neg diabetes  Renal/GU Renal InsufficiencyRenal disease     Musculoskeletal   Abdominal   Peds  Hematology   Anesthesia Other Findings   Reproductive/Obstetrics                            Anesthesia Physical Anesthesia Plan  ASA: III  Anesthesia Plan: General   Post-op Pain Management:    Induction: Intravenous  PONV Risk Score and Plan:   Airway Management Planned: Oral ETT  Additional Equipment:   Intra-op Plan:   Post-operative Plan:   Informed Consent: I have reviewed the patients History and Physical, chart, labs and discussed the procedure including the risks, benefits and alternatives for the proposed anesthesia with the patient or authorized representative who has indicated his/her understanding and acceptance.     Plan Discussed with:   Anesthesia Plan Comments:         Anesthesia Quick Evaluation

## 2018-03-18 ENCOUNTER — Encounter: Payer: Self-pay | Admitting: Orthopedic Surgery

## 2018-03-18 DIAGNOSIS — L02415 Cutaneous abscess of right lower limb: Secondary | ICD-10-CM

## 2018-03-18 DIAGNOSIS — Z8546 Personal history of malignant neoplasm of prostate: Secondary | ICD-10-CM

## 2018-03-18 DIAGNOSIS — N3946 Mixed incontinence: Secondary | ICD-10-CM

## 2018-03-18 DIAGNOSIS — Z960 Presence of urogenital implants: Secondary | ICD-10-CM

## 2018-03-18 DIAGNOSIS — G20 Parkinson's disease: Secondary | ICD-10-CM

## 2018-03-18 DIAGNOSIS — Z9079 Acquired absence of other genital organ(s): Secondary | ICD-10-CM

## 2018-03-18 DIAGNOSIS — Z87891 Personal history of nicotine dependence: Secondary | ICD-10-CM

## 2018-03-18 DIAGNOSIS — Z8619 Personal history of other infectious and parasitic diseases: Secondary | ICD-10-CM

## 2018-03-18 DIAGNOSIS — B964 Proteus (mirabilis) (morganii) as the cause of diseases classified elsewhere: Secondary | ICD-10-CM

## 2018-03-18 DIAGNOSIS — T8453XA Infection and inflammatory reaction due to internal right knee prosthesis, initial encounter: Secondary | ICD-10-CM

## 2018-03-18 DIAGNOSIS — N179 Acute kidney failure, unspecified: Secondary | ICD-10-CM

## 2018-03-18 DIAGNOSIS — Z79899 Other long term (current) drug therapy: Secondary | ICD-10-CM

## 2018-03-18 DIAGNOSIS — Z978 Presence of other specified devices: Secondary | ICD-10-CM

## 2018-03-18 DIAGNOSIS — I10 Essential (primary) hypertension: Secondary | ICD-10-CM

## 2018-03-18 LAB — BASIC METABOLIC PANEL
Anion gap: 9 (ref 5–15)
BUN: 29 mg/dL — ABNORMAL HIGH (ref 8–23)
CO2: 26 mmol/L (ref 22–32)
Calcium: 8.1 mg/dL — ABNORMAL LOW (ref 8.9–10.3)
Chloride: 97 mmol/L — ABNORMAL LOW (ref 98–111)
Creatinine, Ser: 1.34 mg/dL — ABNORMAL HIGH (ref 0.61–1.24)
GFR calc Af Amer: 57 mL/min — ABNORMAL LOW (ref >60)
GFR calc non Af Amer: 49 mL/min — ABNORMAL LOW (ref >60)
Glucose, Bld: 304 mg/dL — ABNORMAL HIGH (ref 70–99)
Potassium: 4.6 mmol/L (ref 3.5–5.1)
Sodium: 132 mmol/L — ABNORMAL LOW (ref 135–145)

## 2018-03-18 LAB — CBC
HCT: 31.4 % — ABNORMAL LOW (ref 40.0–52.0)
Hemoglobin: 10.6 g/dL — ABNORMAL LOW (ref 13.0–18.0)
MCH: 30.1 pg (ref 26.0–34.0)
MCHC: 33.6 g/dL (ref 32.0–36.0)
MCV: 89.5 fL (ref 80.0–100.0)
Platelets: 300 10*3/uL (ref 150–440)
RBC: 3.51 MIL/uL — ABNORMAL LOW (ref 4.40–5.90)
RDW: 14.7 % — ABNORMAL HIGH (ref 11.5–14.5)
WBC: 9.5 10*3/uL (ref 3.8–10.6)

## 2018-03-18 LAB — TROPONIN I: Troponin I: <0.03 ng/mL (ref <0.03)

## 2018-03-18 LAB — LACTIC ACID, PLASMA
Lactic Acid, Venous: 2.1 mmol/L (ref 0.5–1.9)
Lactic Acid, Venous: 1.4 mmol/L (ref 0.5–1.9)

## 2018-03-18 MED ORDER — SODIUM CHLORIDE 0.9 % IV SOLN
INTRAVENOUS | Status: DC
  Administered 2018-03-18 – 2018-03-20 (×4): via INTRAVENOUS

## 2018-03-18 MED ORDER — PRAMIPEXOLE DIHYDROCHLORIDE 0.25 MG PO TABS
0.5000 mg | ORAL_TABLET | Freq: Every day | ORAL | Status: DC
  Administered 2018-03-18 – 2018-03-20 (×3): 0.5 mg via ORAL
  Filled 2018-03-18 (×3): qty 2

## 2018-03-18 MED ORDER — SODIUM CHLORIDE 0.9 % IV BOLUS
1000.0000 mL | Freq: Once | INTRAVENOUS | Status: AC
  Administered 2018-03-18: 1000 mL via INTRAVENOUS

## 2018-03-18 NOTE — Consult Note (Signed)
Date of Admission:  03/17/2018                 Reason for Consult: RT PJI  Referring Provider: Harlow Mares    HPI: Roger Rodriguez is a 77 y.o. male with h/o parkinsons disease, Ca prostate, s/p prostatectomy, HTN, Rt TKA admitted with infected rt knee joint and underwent Removal of the hardware yesterday. I am asked to see the patient for antibiotic management. As per the patient and chart review he fell in March 2019 at home and since then has had pain rt knee joint.Pt has a prosthetic knee since 2012 and was doing well before the fall.  In April he was admitted to the hospital for sepsis and had proteus bacteremia and was treated with IV and then PO on discharge. He has been having redness of the knee and was treated as cellulitis for a few times. He then went to the wound clinic on 8/30 and an abscess was noted and it was aspirated and culture is proteus. Dr.Bowers took him for surgery on 03/17/18 and he underwent total knee removal of the components and placement of antibiotic spacer and wound vac. As per patient he has urge and stress incontinence and is followed by urologist and underwent nerve stimulation. He wears a pull up. He also has parkinson's disease  Past Medical History:  Diagnosis Date  . Allergy    allergic rhinitis  . Arthritis    OA  . Chronic kidney disease    stones  . Depression   . Family history of adverse reaction to anesthesia   . Hematuria   . History of kidney stones   . Hyperlipidemia   . Hypertension    no meds since parkinsons meds started  . Incontinence of urine   . Inguinal hernia   . Neuromuscular disorder ([place])    parkinsons  . Overactive bladder   . Parkinson's disease ([place])   . Prostate cancer ([place])   . Rhinitis, allergic   . Shortness of breath dyspnea    doe secondary to parkinsons    Past Surgical History:  Procedure Laterality Date  . BACK SURGERY  2014  . CYSTOSCOPY WITH STENT PLACEMENT Right 01/13/2016   Procedure: CYSTOSCOPY WITH STENT  PLACEMENT;  Surgeon: Hollice Espy, MD;  Location: [facility] ORS;  Service: Urology;  Laterality: Right;  . CYSTOSCOPY/URETEROSCOPY/HOLMIUM LASER/STENT PLACEMENT Left 11/20/2015   Procedure: CYSTOSCOPY/URETEROSCOPY/HOLMIUM LASER/STENT PLACEMENT;  Surgeon: Hollice Espy, MD;  Location: [facility] OR[facility];  Service: Urology;  Laterality: Left;  . EYE SURGERY    . FRACTURE SURGERY      ankle   . H/O arthroscopy of right knee  2015  . H/O radical protatectomy    . HERNIA REPAIR    . JOINT REPLACEMENT     right knee  . LITHOTRIPSY  2010  . Rotator Cuff tear  1993   Repaired  . TOTAL KNEE REVISION Right 03/17/2018   Procedure: TOTAL KNEE REVISION;  Surgeon: Lovell Sheehan, MD;  Location: [facility] OR[facility];  Service: Orthopedics;  Laterality: Right;  . URETEROSCOPY WITH HOLMIUM LASER LITHOTRIPSY  2002?  [name] URETEROSCOPY WITH HOLMIUM LASER LITHOTRIPSY Right 01/13/2016   Procedure: URETEROSCOPY WITH HOLMIUM LASER LITHOTRIPSY;  Surgeon: Hollice Espy, MD;  Location: [facility] OR[facility];  Service: Urology;  Laterality: Right;  [name] VASECTOMY     History    Social History   Tobacco Use  . Smoking status: Former Smoker    Types: Cigars    Last attempt to quit: 11/11/1968  Years since quitting: 49.3  . Smokeless tobacco: Former Systems developer    Types: Chew    Quit date: 11/12/2003  . Tobacco comment: Occassionally, also chewed tobacco till 2005. Quit smoking 1973  Substance Use Topics  . Alcohol use: No    Alcohol/week: 0.0 standard drinks  . Drug use: No    Family History  Problem Relation Age of Onset  . Dementia Mother   . Cerebral aneurysm Father   . Bladder Cancer Paternal Uncle   . Liver cancer Sister   . Prostate cancer Neg Hx   . Kidney cancer Neg Hx      . acetaminophen  500 mg Oral Q6H  . aspirin  81 mg Oral BID  . Carbidopa-Levodopa ER  3 capsule Oral QID  . cholecalciferol  1,000 Units Oral Daily  . docusate sodium  100 mg Oral BID  . gabapentin  400 mg Oral TID  . loratadine  10 mg Oral Daily  . mirabegron ER   25 mg Oral BID  . pramipexole  0.25 mg Oral BID  . pramipexole  0.5 mg Oral QHS  . vitamin B-12  1,000 mcg Oral Daily  . vitamin C  500 mg Oral Daily  . vitamin E  400 Units Oral Daily      Abtx:  Anti-infectives (From admission, onward)   Start     Dose/Rate Route Frequency Ordered Stop   03/17/18 1815  piperacillin-tazobactam (ZOSYN) IVPB 3.375 g     3.375 g 12.5 mL/hr over 240 Minutes Intravenous Every 8 hours 03/17/18 1756     03/17/18 1800  vancomycin (VANCOCIN) IVPB 1000 mg/200 mL premix     1,000 mg 200 mL/hr over 60 Minutes Intravenous Every 12 hours 03/17/18 1756 03/17/18 2104   03/17/18 1419  50,000 units bacitracin in 0.9% normal saline 250 mL irrigation  Status:  Discontinued       As needed 03/17/18 1419 03/17/18 1627       Review of Systems: Review of Systems  Constitutional: Negative for chills, fever, malaise/fatigue and weight loss.  HENT: Negative for hearing loss and sore throat.   Eyes: Negative for blurred vision.  Cardiovascular: Negative for chest pain and palpitations.  Skin: Negative for rash.   Speech slow and soft Rs- no cough CVS- no chest pain  ABD- no apin, or diarrhea or constipation GU- incontinence   No Known Allergies  OBJECTIVE: Blood pressure 118/68, pulse 85, temperature 98 F (36.7 C), temperature source Oral, resp. rate 18, height 5\' 11"  (1.803 m), weight 95.7 kg, SpO2 98 %.  Physical Exam Constitutional- awake and alert, no distress Eyes: PERL ENT- Tongue dry, dentition okay CVS-s1s2 RS-B/l Air entry GI- Abd soft GU-condom catheter MSK- Rt knee surgcial dressing  SKIN-no obvious lesion Neurologic- speech soft and slurred- did not examine in detail Psychiatric-no agitation Lymphatic- no obvious lymphadenopathy  Lab Results CBC    Component Value Date/Time   WBC 9.5 03/18/2018 0503   RBC 3.51 (L) 03/18/2018 0503   HGB 10.6 (L) 03/18/2018 0503   HGB 12.1 (L) 03/02/2018 1338   HCT 31.4 (L) 03/18/2018 0503   HCT  36.8 (L) 03/02/2018 1338   PLT 300 03/18/2018 0503   PLT 371 03/02/2018 1338   MCV 89.5 03/18/2018 0503   MCV 90 03/02/2018 1338   MCV 95 04/03/2014 0509   MCH 30.1 03/18/2018 0503   MCHC 33.6 03/18/2018 0503   RDW 14.7 (H) 03/18/2018 0503   RDW 14.2 03/02/2018 1338   RDW  13.9 04/03/2014 0509   LYMPHSABS 1.1 03/02/2018 1338   LYMPHSABS 0.8 (L) 04/03/2014 0509   MONOABS 0.8 11/12/2017 1824   MONOABS 1.2 (H) 04/03/2014 0509   EOSABS 0.2 03/02/2018 1338   EOSABS 0.2 04/03/2014 0509   BASOSABS 0.0 03/02/2018 1338   BASOSABS 0.0 04/03/2014 0509   BASOSABS 0 11/24/2012 1220    CMP Latest Ref Rng & Units 03/18/2018 03/17/2018 03/02/2018  Glucose 70 - 99 mg/dL 304(H) 171(H) 156(H)  BUN 8 - 23 mg/dL 29(H) 23 22  Creatinine 0.61 - 1.24 mg/dL 1.34(H) 0.99 1.13  Sodium 135 - 145 mmol/L 132(L) 135 133(L)  Potassium 3.5 - 5.1 mmol/L 4.6 4.5 4.9  Chloride 98 - 111 mmol/L 97(L) 100 94(L)  CO2 22 - 32 mmol/L 26 28 27   Calcium 8.9 - 10.3 mg/dL 8.1(L) 8.9 9.3  Total Protein 6.0 - 8.5 g/dL - - 7.4  Total Bilirubin 0.0 - 1.2 mg/dL - - 0.4  Alkaline Phos 39 - 117 IU/L - - 85  AST 0 - 40 IU/L - - 7  ALT 0 - 44 IU/L - - 3      Microbiology: Recent Results (from the past 240 hour(s))  Aerobic Culture (superficial specimen)     Status: None   Collection Time: 03/11/18  1:46 PM  Result Value Ref Range Status   Specimen Description   Final    KNEE right Performed at [facility], [address] [place], [place] [address]    Special Requests   Final    NONE Performed at [facility], [address] [place], [place] [address]    Gram Stain   Final    FEW WBC PRESENT, PREDOMINANTLY PMN RARE GRAM POSITIVE COCCI    Culture FEW PROTEUS MIRABILIS  Final   Report Status 03/14/2018 FINAL  Final   Organism ID, Bacteria PROTEUS MIRABILIS  Final      Susceptibility   Proteus mirabilis - MIC*    AMPICILLIN <=2 SENSITIVE Sensitive     CEFAZOLIN <=4 SENSITIVE Sensitive      CEFEPIME <=1 SENSITIVE Sensitive     CEFTAZIDIME <=1 SENSITIVE Sensitive     CEFTRIAXONE <=1 SENSITIVE Sensitive     CIPROFLOXACIN <=0.25 SENSITIVE Sensitive     GENTAMICIN <=1 SENSITIVE Sensitive     IMIPENEM 2 SENSITIVE Sensitive     TRIMETH/SULFA <=20 SENSITIVE Sensitive     AMPICILLIN/SULBACTAM <=2 SENSITIVE Sensitive     PIP/TAZO <=4 SENSITIVE Sensitive     * FEW PROTEUS MIRABILIS  Aerobic/Anaerobic Culture (surgical/deep wound)     Status: None (Preliminary result)   Collection Time: 03/17/18  2:15 PM  Result Value Ref Range Status   Specimen Description FLUID RIGHT KNEE  Final   Special Requests   Final    DEEP,SURGICAL Performed at [facility], [address] [place], [place] [address]    Gram Stain   Final    MODERATE WBC PRESENT, PREDOMINANTLY MONONUCLEAR NO ORGANISMS SEEN    Culture   Final    NO GROWTH < 12 HOURS Performed at [facility], [place] [address], [place], [place] [address]    Report Status PENDING  Incomplete  Aerobic/Anaerobic Culture (surgical/deep wound)     Status: None (Preliminary result)   Collection Time: 03/17/18  2:24 PM  Result Value Ref Range Status   Specimen Description TISSUE SYNOVIAL RIGHT KNEE  Final   Special Requests   Final    NONE Performed at [facility], [address] [place], [place] [address]    Gram Stain   Final    ABUNDANT WBC PRESENT, PREDOMINANTLY PMN NO ORGANISMS SEEN    Culture   Final    NO GROWTH < 12 HOURS Performed at [facility] [address], [place], [place] [address]    Report Status PENDING  Incomplete    Radiographs and labs were personally reviewed by me.   Assessment and Plan  77 y.o. male with h/o parkinsons disease, Ca prostate, s/p prostatectomy, HTN, Rt TKA admitted with infected rt knee joint and underwent Removal of the hardware yesterday. I am asked to see the patient for antibiotic management.  Prosthetic rt knee infection- s/p complete removal of  hardware- Surgical cultures pending. Proteus was present on an OP culture from the rt knee abscess Await surgical culture to decide on final antibioic Was on vanco and zosyn- vanco Dc Will need 6 weeks of antibiotic- final antibiotic recommendation will be after culture result  Parkinsons disease on carbidopa  HTN- had low BP thjs morning and is getting fluids  AKI- likely a combination of prerenal, toradol /lisinopril and vanco -avoid nephrotoxic drugs  Discussed the management with the patient and Dr.Bowers   ID will follow him peripherally this weekend. Call if needed Tsosie Billing, MD  03/18/2018, 5:24 PM  Note:  This document was prepared using Dragon voice recognition software and may include unintentional dictation errors.

## 2018-03-18 NOTE — Consult Note (Addendum)
WOC consult requested for Vac dressing change on Sat. Called Dr Harlow Mares of the surgical team to inform him the [facility] team is not  available for consults during the weekend.  Pt returned from the OR yesterday evening.  First post-op dressing change is now requested to be performed by the [facility] nurse on Monday.  Vac is intact with good seal at 137mm cont suction, small amt pink drainage in the cannister. Supplies at the bedside. Julien Girt MSN, RN, [place], [place], [place]

## 2018-03-18 NOTE — Progress Notes (Signed)
Notified Dr. Sabra Heck that patient has low BP due Dr. Harlow Mares being in surgery. Order received for medical consult.

## 2018-03-18 NOTE — Significant Event (Signed)
Rapid Response Event Note  Overview: Time Called: 0928 Arrival Time: 0930 Event Type: Hypotension  Initial Focused Assessment: Rapid Response RN arrived in room to patient lying flat. Patient's BP was 88/49. Patient reported feeling light headed but not short of breath. Skin dry, pulse +2 bilateral radial arteries. Lungs clear. Vital signs: BP 88/49, HR 87 SR, RR 13, 96% on 1 L nasal cannula. Patient with low urine output overnight.  Interventions: 500 mL bolus of normal saline - converted to 2 L bolus by Dr. Brett Albino. BP 99/59 at 0948. Patient reported feeling better but still weak.  Plan of Care (if not transferred): Dr. Brett Albino to assess patient and order labs.  Event Summary: Name of Physician Notified: Dr. Harlow Mares at [phone]  Name of Consulting Physician Notified: Dr. Brett Albino at [phone]  Outcome: Stayed in room and stabalized  Event End Time: [place], [place]

## 2018-03-18 NOTE — Clinical Social Work Note (Signed)
Clinical Social Work Assessment  Patient Details  Name: Roger Rodriguez MRN: [id number] Date of Birth: 06-08-1941  Date of referral:  03/18/18               Reason for consult:  Facility Placement                Permission sought to share information with:  Chartered certified accountant granted to share information::  Yes, Verbal Permission Granted  Name::      [place]::   [place]  Relationship::     Contact Information:     Housing/Transportation Living arrangements for the past 2 months:  [place] of Information:  Patient Patient Interpreter Needed:  None Criminal Activity/Legal Involvement Pertinent to Current Situation/Hospitalization:  No - Comment as needed Significant Relationships:  Adult Children, Friend Lives with:  Self Do you feel safe going back to the place where you live?  Yes Need for family participation in patient care:  Yes (Comment)  Care giving concerns:  Patient lives in [place] and has 24/7 private duty caregivers.    Social Worker assessment / plan:  Holiday representative (CSW) received SNF consult. PT is recommending SNF. CSW met with patient and his private duty caregiver [name] was at bedside. Patient was alert and oriented X4 and was laying in the bed. CSW introduced self and explained role of CSW department. Per patient he lives in [place] alone however he hires caregivers. CSW explained SNF process and that Health Team will have to approve it. Patient is agreeable to SNF search and prefers [facility] in [place]. Per patient he was at [facility] between April and May 2019. CSW explained that patient will need a wound vac and likely IV ABX. Patient reported that he is also considering going home and letting his caregivers mange his care. Per [name] caregiver she can manage IV ABX and wound care at home if needed. Patient reported that he will talk to his sons about the options.  Patient is agreeable for SNF search and reported that if [facility] does not offer a bed he will accept a bed from [facility]. Patient is agreeable for CSW to start insruance authorization. CSW contacted Health Team and stated SNF authorization. CSW will continue to follow and assist as needed.   Employment status:  Disabled (Comment on whether or not currently receiving Disability), Retired Nurse, adult PT Recommendations:  [place] / Referral to community resources:  [place]  Patient/Family's Response to care:  Patient is agreeable to [employer].   Patient/Family's Understanding of and Emotional Response to Diagnosis, Current Treatment, and Prognosis:  Patient was pleasant and thanked CSW for assistance.   Emotional Assessment Appearance:  Appears stated age Attitude/Demeanor/Rapport:    Affect (typically observed):  Accepting, Adaptable, Pleasant Orientation:  Oriented to Self, Oriented to Place, Oriented to Situation, Oriented to  Time Alcohol / Substance use:  Not Applicable Psych involvement (Current and /or in the community):  No (Comment)  Discharge Needs  Concerns to be addressed:  Discharge Planning Concerns Readmission within the last 30 days:  No Current discharge risk:  Dependent with Mobility Barriers to Discharge:  Continued Medical Work up   [employer], [place] Beets, LCSW 03/18/2018, 4:47 PM

## 2018-03-18 NOTE — Progress Notes (Signed)
CRITICAL VALUE ALERT  Critical Value:  Lactic acid 2.1  Date & Time Notied:  03/18/18 1035  Provider Notified: Dr. Brett Albino 10:41  Orders Received/Actions taken: recheck of lactic acid

## 2018-03-18 NOTE — Progress Notes (Signed)
Subjective:  Patient reports pain as mild.  Low blood pressure this morning, rapid response called.  Objective:   VITALS:   Vitals:   03/18/18 1100 03/18/18 1210 03/18/18 1453 03/18/18 1600  BP: 113/60 118/68 (!) 102/58 118/75  Pulse:  85 89 90  Resp:   17   Temp:   97.9 F (36.6 C)   TempSrc:   Oral   SpO2: 98%  96% 97%  Weight:      Height:        PHYSICAL EXAM:  Sensation intact distally Intact pulses distally Dorsiflexion/Plantar flexion intact Incision: dressing C/D/I Compartment soft  LABS  Results for orders placed or performed during the hospital encounter of 03/17/18 (from the past 24 hour(s))  CBC     Status: Abnormal   Collection Time: 03/18/18  5:03 AM  Result Value Ref Range   WBC 9.5 3.8 - 10.6 K/uL   RBC 3.51 (L) 4.40 - 5.90 MIL/uL   Hemoglobin 10.6 (L) 13.0 - 18.0 g/dL   HCT 31.4 (L) 40.0 - 52.0 %   MCV 89.5 80.0 - 100.0 fL   MCH 30.1 26.0 - 34.0 pg   MCHC 33.6 32.0 - 36.0 g/dL   RDW 14.7 (H) 11.5 - 14.5 %   Platelets 300 150 - 440 K/uL  Basic metabolic panel     Status: Abnormal   Collection Time: 03/18/18  5:03 AM  Result Value Ref Range   Sodium 132 (L) 135 - 145 mmol/L   Potassium 4.6 3.5 - 5.1 mmol/L   Chloride 97 (L) 98 - 111 mmol/L   CO2 26 22 - 32 mmol/L   Glucose, Bld 304 (H) 70 - 99 mg/dL   BUN 29 (H) 8 - 23 mg/dL   Creatinine, Ser 1.34 (H) 0.61 - 1.24 mg/dL   Calcium 8.1 (L) 8.9 - 10.3 mg/dL   GFR calc non Af Amer 49 (L) >60 mL/min   GFR calc Af Amer 57 (L) >60 mL/min   Anion gap 9 5 - 15  Lactic acid, plasma     Status: Abnormal   Collection Time: 03/18/18  9:57 AM  Result Value Ref Range   Lactic Acid, Venous 2.1 (HH) 0.5 - 1.9 mmol/L  Troponin I     Status: None   Collection Time: 03/18/18  9:57 AM  Result Value Ref Range   Troponin I <0.03 <0.03 ng/mL  Lactic acid, plasma     Status: None   Collection Time: 03/18/18 12:56 PM  Result Value Ref Range   Lactic Acid, Venous 1.4 0.5 - 1.9 mmol/L    Dg Knee Right  Port  Result Date: 03/17/2018 CLINICAL DATA:  77 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 New prostheses likely consisting of antibiotic impregnated cement are present in the tibial plateau and overlying the distal femoral condyles. Associated soft tissue swelling and air within the joint space. A wound VAC is present laterally at the knee joint. IMPRESSION: Surgical changes of revision knee arthroplasty with removal of prior hardware and placement of a temporary prosthesis likely consisting of antibiotic impregnated cement. Electronically Signed   By: Jacqulynn Cadet M.D.   On: 03/17/2018 17:26   [place] Ekg Site Rite  Result Date: 03/17/2018 If Site Rite image not attached, placement could not be confirmed due to current cardiac rhythm.   Assessment/Plan: 1 Day Post-Op   Active  Problems:   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[place])   Up with therapy Continue ABX therapy due to Culture taken of surgical site during joint revision Continue foley due to urinary output monitoring   Appreciate ID and hospitalist recommendations  Patient will require PIC line for long term antibiotics. Plan wound vac change on Monday.  Discharge planning, may require SNF placement versus home with home health.   Lovell Sheehan , MD 03/18/2018, 7:24 PM

## 2018-03-18 NOTE — Progress Notes (Signed)
Awaiting approval from infectious disease for PICC placement.  Donaciano Eva, RN aware.  Carolee Rota, RN VAST

## 2018-03-18 NOTE — Progress Notes (Signed)
PT Cancellation Note  Patient Details Name: Roger Rodriguez MRN: [id number] DOB: [birth year]   Cancelled Treatment:    Reason Eval/Treat Not Completed: Medical issues which prohibited therapy(Consult received and chart reviewed.  Per RN, patient with significant hypotension; advises hold at this time.  Will continue to follow and initiate as appropriate.)   [place] H. Owens Shark, PT, DPT, NCS 03/18/18, 10:05 AM [phone]

## 2018-03-18 NOTE — NC FL2 (Signed)
[place] LEVEL OF CARE SCREENING TOOL     IDENTIFICATION  Patient Name: Roger Rodriguez Birthdate: May 14, 1941 Sex: male Admission Date (Current Location): 03/17/2018  [place] and [place] Number:  Engineering geologist and Address:  [facility], [address] [place], [place] [address]      Provider Number: [id number]  Attending Physician Name and Address:  Lovell Sheehan, MD  Relative Name and Phone Number:       Current Level of Care: Hospital Recommended Level of Care: [place] Prior Approval Number:    Date Approved/Denied:   PASRR Number: [id number])  Discharge Plan: SNF    Current Diagnoses: Patient Active Problem List   Diagnosis Date Noted  . 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 Prosthetic joint infection ([place]) 03/16/2018  . Chronic venous insufficiency 11/22/2017  . ARF (acute renal failure) ([place]) 10/12/2017  . RUQ pain 10/22/2016  . Depression, major, single episode, moderate ([place]) 10/22/2016  . Renal cyst, right 09/28/2016  . Hepatic steatosis 09/28/2016  . Parkinson's disease ([place]) 09/18/2015  . Restless leg 09/18/2015  . Malignant neoplasm of prostate ([place]) 05/22/2015  . Hepatitis A 04/02/2015  . Allergic rhinitis 04/02/2015  . Arthritis 04/02/2015  . Narrowing of intervertebral disc space 04/02/2015  . 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 Accumulation of fluid in tissues 04/02/2015  . Essential (primary) hypertension 04/02/2015  . Borderline diabetes 04/02/2015  . Hyperlipidemia 04/02/2015  . BP (high blood pressure) 04/02/2015  . Infected sebaceous cyst 04/02/2015  . Hernia, inguinal 04/02/2015  . Leg pain 04/02/2015  . Lumbar canal stenosis 04/02/2015  . 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 Malignant melanoma ([place]) 04/02/2015  . Arthritis, degenerative 04/02/2015  . Fatigue 04/02/2015  . Idiopathic Parkinson's disease ([place]) 04/02/2015  . Plantar  fasciitis 04/02/2015  . CA of prostate ([place]) 04/02/2015  . Benign essential tremor 04/02/2015  . Lower urinary tract infection 04/02/2015  . Degeneration of intervertebral disc of lumbosacral region 05/10/2012    Orientation RESPIRATION BLADDER Height & Weight     Self, Time, Situation, Place  O2(2 Liters Oxygen. ) Incontinent Weight: 211 lb (95.7 kg) Height:  5\' 11"  (180.3 cm)  BEHAVIORAL SYMPTOMS/MOOD NEUROLOGICAL BOWEL NUTRITION STATUS      Continent Diet(Diet: Regular )  AMBULATORY STATUS COMMUNICATION OF NEEDS Skin   Extensive Assist Verbally Surgical wounds, Wound Vac(Incision: Right Knee. Wound Vac Right Knee. )                       Personal Care Assistance Level of Assistance  Bathing, Feeding, Dressing Bathing Assistance: Limited assistance Feeding assistance: Independent Dressing Assistance: Limited assistance     Functional Limitations Info  Hearing, Sight, Speech Sight Info: Adequate Hearing Info: Adequate Speech Info: Adequate    SPECIAL CARE FACTORS FREQUENCY  PT (By licensed PT), OT (By licensed OT)     PT Frequency: (5) OT Frequency: (5)            Contractures      Additional Factors Info  Code Status, Allergies Code Status Info: (Full Code. ) Allergies Info: (No Known Allergies. )           Current Medications (03/18/2018):  This is the current hospital active medication list Current Facility-Administered Medications  Medication Dose Route Frequency Provider Last Rate Last Dose  . acetaminophen (TYLENOL) tablet 325-650 mg  325-650 mg Oral Q6H PRN Lovell Sheehan, MD      .  acetaminophen (TYLENOL) tablet 500 mg  500 mg Oral Q6H Lovell Sheehan, MD   500 mg at 03/18/18 0502  . aspirin chewable tablet 81 mg  81 mg Oral BID Lovell Sheehan, MD   81 mg at 03/17/18 2155  . bisacodyl (DULCOLAX) suppository 10 mg  10 mg Rectal Daily PRN Lovell Sheehan, MD      . Carbidopa-Levodopa ER 23.75-95 MG CPCR 3 capsule  3 capsule Oral QID Lovell Sheehan, MD   3 capsule at 03/17/18 2159  . cholecalciferol (VITAMIN D) tablet 1,000 Units  1,000 Units Oral Daily Lovell Sheehan, MD      . docusate sodium (COLACE) capsule 100 mg  100 mg Oral BID Lovell Sheehan, MD   100 mg at 03/17/18 2154  . fluticasone (FLONASE) 50 MCG/ACT nasal spray 1 spray  1 spray Each Nare Q12H PRN Lovell Sheehan, MD      . gabapentin (NEURONTIN) capsule 400 mg  400 mg Oral TID Lovell Sheehan, MD   400 mg at 03/17/18 2154  . HYDROcodone-acetaminophen (NORCO) 7.5-325 MG per tablet 1-2 tablet  1-2 tablet Oral Q4H PRN Lovell Sheehan, MD      . HYDROcodone-acetaminophen (NORCO/VICODIN) 5-325 MG per tablet 1-2 tablet  1-2 tablet Oral Q4H PRN Lovell Sheehan, MD   2 tablet at 03/17/18 1959  . ketorolac (TORADOL) 15 MG/ML injection 7.5 mg  7.5 mg Intravenous Q6H Lovell Sheehan, MD   7.5 mg at 03/18/18 0502  . lactated ringers infusion   Intravenous Continuous Lovell Sheehan, MD   Stopped at 03/17/18 1853  . lisinopril (PRINIVIL,ZESTRIL) tablet 2.5 mg  2.5 mg Oral BID Lovell Sheehan, MD   2.5 mg at 03/17/18 2158  . loratadine (CLARITIN) tablet 10 mg  10 mg Oral Daily Lovell Sheehan, MD   10 mg at 03/17/18 2159  . magnesium citrate solution 1 Bottle  1 Bottle Oral Once PRN Lovell Sheehan, MD      . magnesium hydroxide (MILK OF MAGNESIA) suspension 30 mL  30 mL Oral Daily PRN Lovell Sheehan, MD      . menthol-cetylpyridinium (CEPACOL) lozenge 3 mg  1 lozenge Oral PRN Lovell Sheehan, MD       Or  . phenol (CHLORASEPTIC) mouth spray 1 spray  1 spray Mouth/Throat PRN Lovell Sheehan, MD      . metoCLOPramide (REGLAN) tablet 5-10 mg  5-10 mg Oral Q8H PRN Lovell Sheehan, MD       Or  . metoCLOPramide (REGLAN) injection 5-10 mg  5-10 mg Intravenous Q8H PRN Lovell Sheehan, MD      . mirabegron ER [facility]) tablet 25 mg  25 mg Oral BID Lovell Sheehan, MD   25 mg at 03/17/18 2159  . morphine 2 MG/ML injection 0.5-1 mg  0.5-1 mg Intravenous Q2H PRN Lovell Sheehan, MD       . ondansetron [facility]) tablet 4 mg  4 mg Oral Q6H PRN Lovell Sheehan, MD       Or  . ondansetron [facility]) injection 4 mg  4 mg Intravenous Q6H PRN Lovell Sheehan, MD      . piperacillin-tazobactam (ZOSYN) IVPB 3.375 g  3.375 g Intravenous Q8H Lovell Sheehan, MD 12.5 mL/hr at 03/18/18 0248 3.375 g at 03/18/18 0248  . polyvinyl alcohol (LIQUIFILM TEARS) 1.4 % ophthalmic solution 1 drop  1 drop Both Eyes PRN Lovell Sheehan, MD      .  pramipexole (MIRAPEX) tablet 0.25 mg  0.25 mg Oral BID Lovell Sheehan, MD   0.25 mg at 03/17/18 2159  . rOPINIRole (REQUIP) tablet 0.5 mg  0.5 mg Oral QHS Lovell Sheehan, MD   0.5 mg at 03/17/18 2155  . vitamin B-12 (CYANOCOBALAMIN) tablet 1,000 mcg  1,000 mcg Oral Daily Lovell Sheehan, MD   1,000 mcg at 03/17/18 2154  . vitamin C (ASCORBIC ACID) tablet 500 mg  500 mg Oral Daily Lovell Sheehan, MD   500 mg at 03/17/18 2159  . vitamin E capsule 400 Units  400 Units Oral Daily Lovell Sheehan, MD   400 Units at 03/17/18 2159     Discharge Medications: Please see discharge summary for a list of discharge medications.  Relevant Imaging Results:  Relevant Lab Results:   Additional Information (SSN: [id number])  [place], Veronia Beets, LCSW

## 2018-03-18 NOTE — Progress Notes (Signed)
NT notified RN this am around 9am that pt's BP was 78/48. On assesment, pt is sitting up in bed, pale and is lightheaded.  Received hospitalist consult from Dr. Sabra Heck; Dr. Brett Albino in to see pt shortly after. AC and ICU RN also at bedside. Pt's BP gradually increased to 110s/60s after 2nd bolus completed. Will continue to monitor.   Roger Rodriguez, Jerry Caras

## 2018-03-18 NOTE — Consult Note (Addendum)
[place] [facility] [place] NAME: Quantavious Eggert    MR#:  [id number]  DATE OF BIRTH:  10/19/1940  DATE OF ADMISSION:  03/17/2018  PRIMARY CARE PHYSICIAN: [name]., MD   REQUESTING/REFERRING PHYSICIAN: Kurtis Bushman, MD  CHIEF COMPLAINT:  Lightheadedness   HISTORY OF PRESENT ILLNESS:  Stephens Shreve  is a 77 y.o. male with a known history of HTN, HLD, Parkinson's disease, prostate cancer, CKD who is POD 1 from a right total knee revision with placement of antibiotic spacer and wound vac due to right prosthetic joint infection. Patient did not have any immediate post-op complications. He became lightheaded and hypotensive this morning. BP was noted to be 76/46. Rapid response was called. Hospitalist was called for consult.  PAST MEDICAL HISTORY:   Past Medical History:  Diagnosis Date  . Allergy    allergic rhinitis  . Arthritis    OA  . Chronic kidney disease    stones  . Depression   . Family history of adverse reaction to anesthesia   . Hematuria   . History of kidney stones   . Hyperlipidemia   . Hypertension    no meds since parkinsons meds started  . Incontinence of urine   . Inguinal hernia   . Neuromuscular disorder ([place])    parkinsons  . Overactive bladder   . Parkinson's disease ([place])   . Prostate cancer ([place])   . Rhinitis, allergic   . Shortness of breath dyspnea    doe secondary to parkinsons    PAST SURGICAL HISTOIRY:   Past Surgical History:  Procedure Laterality Date  . BACK SURGERY  2014  . CYSTOSCOPY WITH STENT PLACEMENT Right 01/13/2016   Procedure: CYSTOSCOPY WITH STENT PLACEMENT;  Surgeon: Hollice Espy, MD;  Location: [facility] ORS;  Service: Urology;  Laterality: Right;  . CYSTOSCOPY/URETEROSCOPY/HOLMIUM LASER/STENT PLACEMENT Left 11/20/2015   Procedure: CYSTOSCOPY/URETEROSCOPY/HOLMIUM LASER/STENT PLACEMENT;  Surgeon: Hollice Espy, MD;  Location: [facility] ORS;  Service: Urology;  Laterality: Left;  .  EYE SURGERY    . FRACTURE SURGERY      ankle   . H/O arthroscopy of right knee  2015  . H/O radical protatectomy    . HERNIA REPAIR    . JOINT REPLACEMENT     right knee  . LITHOTRIPSY  2010  . Rotator Cuff tear  1993   Repaired  . TOTAL KNEE REVISION Right 03/17/2018   Procedure: TOTAL KNEE REVISION;  Surgeon: Lovell Sheehan, MD;  Location: [facility] ORS;  Service: Orthopedics;  Laterality: Right;  . URETEROSCOPY WITH HOLMIUM LASER LITHOTRIPSY  2002?  [name] URETEROSCOPY WITH HOLMIUM LASER LITHOTRIPSY Right 01/13/2016   Procedure: URETEROSCOPY WITH HOLMIUM LASER LITHOTRIPSY;  Surgeon: Hollice Espy, MD;  Location: [facility] ORS;  Service: Urology;  Laterality: Right;  [name] VASECTOMY     History    SOCIAL HISTORY:   Social History   Tobacco Use  . Smoking status: Former Smoker    Types: Cigars    Last attempt to quit: 11/11/1968    Years since quitting: 49.3  . Smokeless tobacco: Former Systems developer    Types: Chew    Quit date: 11/12/2003  . Tobacco comment: Occassionally, also chewed tobacco till 2005. Quit smoking 1973  Substance Use Topics  . Alcohol use: No    Alcohol/week: 0.0 standard drinks    FAMILY HISTORY:   Family History  Problem Relation Age of Onset  . Dementia Mother   . Cerebral aneurysm Father   .  Bladder Cancer Paternal Uncle   . Liver cancer Sister   . Prostate cancer Neg Hx   . Kidney cancer Neg Hx     DRUG ALLERGIES:  No Known Allergies  REVIEW OF SYSTEMS:  CONSTITUTIONAL: No fever, fatigue or weakness.  EYES: No blurred or double vision.  EARS, NOSE, AND THROAT: No tinnitus or ear pain.  RESPIRATORY: No cough, shortness of breath, wheezing or hemoptysis.  CARDIOVASCULAR: No chest pain, orthopnea, edema.  GASTROINTESTINAL: No nausea, vomiting, diarrhea or abdominal pain.  GENITOURINARY: No dysuria, hematuria.  ENDOCRINE: No polyuria, nocturia,  HEMATOLOGY: No anemia, easy bruising or bleeding SKIN: No rash or lesion. MUSCULOSKELETAL: No joint pain or arthritis.     NEUROLOGIC: No tingling, numbness, weakness. +lightheadedness PSYCHIATRY: No anxiety or depression.   MEDICATIONS AT HOME:   Prior to Admission medications   Medication Sig Start Date End Date Taking? Authorizing Provider  acetaminophen-codeine (TYLENOL #3) 300-30 MG tablet Take 1 tablet by mouth every 8 (eight) hours as needed for moderate pain.  01/31/18  Yes [provider]  budesonide (RHINOCORT AQUA) 32 MCG/ACT nasal spray USE 1 PUFF IN EACH NOSTRIL EVERY DAY AS NEEDED Patient taking differently: Place 1 spray into both nostrils 2 (two) times daily.  02/07/18  Yes [name]., MD  cholecalciferol (VITAMIN D) 1000 units tablet Take 1,000 Units by mouth daily.  05/19/12  Yes [provider]  gabapentin (NEURONTIN) 400 MG capsule Take 400 mg by mouth 3 (three) times daily.  03/26/15  Yes [provider]  Hypromellose (ARTIFICIAL TEARS OP) Place 1 drop into both eyes 4 (four) times daily as needed (for dry eyes).    Yes [provider]  lisinopril (PRINIVIL,ZESTRIL) 2.5 MG tablet Take 2.5 mg by mouth 2 (two) times daily.    Yes [provider]  loratadine (CLARITIN) 10 MG tablet Take 10 mg by mouth daily. 05/19/12  Yes [provider]  Melatonin 3 MG TABS Take 3 mg by mouth at bedtime as needed (for sleep).    Yes [provider]  mirabegron ER (MYRBETRIQ) 25 MG TB24 tablet Myrbetriq 25 mg tablet,extended release  TAKE 1 TABLET (25 MG TOTAL) 2 (TWO) TIMES DAILY BY MOUTH. Patient taking differently: Take 25 mg by mouth 2 (two) times daily.  02/08/18  Yes Hollice Espy, MD  Multiple Vitamin (MULTIVITAMIN WITH MINERALS) TABS tablet Take 1 tablet by mouth daily.   Yes [provider]  naproxen (NAPROSYN) 375 MG tablet Take 375 mg by mouth 2 (two) times daily. 01/31/18  Yes [provider]  pramipexole (MIRAPEX) 0.5 MG tablet Take 2 tablets (1 mg total) by mouth at bedtime. Patient taking differently: Take  0.25-0.5 mg by mouth See admin instructions. Take 0.25 mg by mouth twice daily. Take 0.5 mg by mouth at bedtime. 10/17/17  Yes Mody, Ulice Bold, MD  RYTARY 23.75-95 MG CPCR Take 3 capsules by mouth 4 (four) times daily.  01/08/18  Yes [provider]  vitamin B-12 (CYANOCOBALAMIN) 1000 MCG tablet Take 1,000 mcg by mouth daily.   Yes [provider]  vitamin C (ASCORBIC ACID) 500 MG tablet Take 500 mg by mouth daily.   Yes [provider]  vitamin E (VITAMIN E) 400 UNIT capsule Take 400 Units by mouth daily.    Yes [provider]  chlorpheniramine-HYDROcodone (TUSSIONEX PENNKINETIC ER) 10-8 MG/5ML SUER Take 5 mLs by mouth at bedtime as needed for cough. Patient not taking: Reported on 03/02/2018 02/03/18   Eulas Post  Brooke Bonito., MD  triamcinolone cream (KENALOG) 0.1 % Apply 1 application topically 2 (two) times daily. Patient not taking: Reported on 03/16/2018 03/02/18   [name]., MD      VITAL SIGNS:  Blood pressure 118/68, pulse 85, temperature 98 F (36.7 C), temperature source Oral, resp. rate 18, height 5\' 11"  (1.803 m), weight 95.7 kg, SpO2 98 %.  PHYSICAL EXAMINATION:  GENERAL:  77 y.o.-year-old patient lying flat in the bed with no acute distress.  EYES: Pupils equal, round, reactive to light and accommodation. No scleral icterus. Extraocular muscles intact.  HEENT: Head atraumatic, normocephalic. Oropharynx and nasopharynx clear. Dry mucous membranes. NECK:  Supple, no jugular venous distention. No thyroid enlargement, no tenderness.  LUNGS: Normal breath sounds bilaterally, no wheezing, rales,rhonchi or crepitation. No use of accessory muscles of respiration.  CARDIOVASCULAR: RRR, S1, S2 normal. No murmurs, rubs, or gallops.  ABDOMEN: Soft, nontender, nondistended. Bowel sounds present. No organomegaly or mass.  EXTREMITIES: No pedal edema, cyanosis, or clubbing. +wound vac in place over right knee. NEUROLOGIC: Cranial nerves II through XII are  intact. Muscle strength 5/5 in all extremities. Sensation intact. Gait not checked.  PSYCHIATRIC: The patient is alert and oriented x 3.  SKIN: No obvious rash, lesion, or ulcer.  LABORATORY PANEL:   CBC Recent Labs  Lab 03/18/18 0503  WBC 9.5  HGB 10.6*  HCT 31.4*  PLT 300   ------------------------------------------------------------------------------------------------------------------  Chemistries  Recent Labs  Lab 03/18/18 0503  NA 132*  K 4.6  CL 97*  CO2 26  GLUCOSE 304*  BUN 29*  CREATININE 1.34*  CALCIUM 8.1*   ------------------------------------------------------------------------------------------------------------------  Cardiac Enzymes Recent Labs  Lab 03/18/18 0957  TROPONINI <0.03   ------------------------------------------------------------------------------------------------------------------  RADIOLOGY:  Dg Knee Right Port  Result Date: 03/17/2018 CLINICAL DATA:  76 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 New prostheses likely consisting of antibiotic impregnated cement are present in the tibial plateau and overlying the distal femoral condyles. Associated soft tissue swelling and air within the joint space. A wound VAC is present laterally at the knee joint. IMPRESSION: Surgical changes of revision knee arthroplasty with removal of prior hardware and placement of a temporary prosthesis likely consisting of antibiotic impregnated cement. Electronically Signed   By: Jacqulynn Cadet M.D.   On: 03/17/2018 17:26   [place] Ekg Site Rite  Result Date: 03/17/2018 If Site Rite image not attached, placement could not be confirmed due to current cardiac rhythm.   EKG:   Orders placed or performed during the hospital encounter of 10/12/17  . ED EKG  . ED EKG    IMPRESSION  AND PLAN:   Hypotension- likely due to volume depletion. Patient dry-appearing on exam and only had 150cc UOP overnight. Bump in creatinine from yesterday to today also supports this. No tachycardia or fevers to suggest sepsis. - 2L NS bolus ordered (no history of heart failure), will then start IVFs - check lactic acid and troponin - check bladder scan due to low UOP - follow blood pressures closely - already on vanc/zosyn and afebrile so will not order blood cultures - hold home lisinopril  Acute kidney injury- Cr increased from 0.99 to 1.34 today. Likely volume depletion + vancomycin. - 2L NS bolus and MIVFs overnight - hold home lisinopril - recheck bmp in the morning  Right knee prosthetic joint infection- s/p total knee removal,  placement of antibiotic spacer, placement of wound vac on 9/5 - per ortho - wound care consult   All the records are reviewed and case discussed with Consulting provider. Management plans discussed with the patient, family and they are in agreement.  CODE STATUS: Full  TOTAL TIME TAKING CARE OF THIS PATIENT: 32 minutes.    Berna Spare Preciosa Bundrick M.D on 03/18/2018 at 3:58 PM  Between 7am to 6pm - Pager - [phone]  After 6pm go to [email or web address] - password [email or web address] [facility]  [facility] [place] [facility]  Office  [phone]  CC: Primary care Physician: [name]., MD   Note: This dictation was prepared with Dragon dictation along with smaller phrase technology. Any transcriptional errors that result from this process are unintentional.

## 2018-03-18 NOTE — Progress Notes (Signed)
Pt has still had decreased urine output despite 2L boluses today and IVF at 75cc and then 100 cc. Pt voided 150cc in urinal around 1200, and then small amounts x2 incontinent. Bladder scan at 6 showed 75 cc. Dr. Brett Albino notified, received order to bladder scan again in 2-3 hours.   [place], Roger Rodriguez

## 2018-03-18 NOTE — Clinical Social Work Placement (Signed)
   CLINICAL SOCIAL WORK PLACEMENT  NOTE  Date:  03/18/2018  Patient Details  Name: Roger Rodriguez MRN: [id number] Date of Birth: [birth year]  Clinical Social Work is seeking post-discharge placement for this patient at the [place] level of care (*CSW will initial, date and re-position this form in  chart as items are completed):  Yes   Patient/family provided with [facility] Work Department's list of facilities offering this level of care within the geographic area requested by the patient (or if unable, by the patient's family).  Yes   Patient/family informed of their freedom to choose among providers that offer the needed level of care, that participate in Medicare, Medicaid or managed care program needed by the patient, have an available bed and are willing to accept the patient.  Yes   Patient/family informed of [facility]'s ownership interest in [facility] and [facility], as well as of the fact that they are under no obligation to receive care at these facilities.  PASRR submitted to EDS on       PASRR number received on       Existing PASRR number confirmed on 03/18/18     FL2 transmitted to all facilities in geographic area requested by pt/family on 03/18/18     FL2 transmitted to all facilities within larger geographic area on       Patient informed that his/her managed care company has contracts with or will negotiate with certain facilities, including the following:            Patient/family informed of bed offers received.  Patient chooses bed at       Physician recommends and patient chooses bed at      Patient to be transferred to   on  .  Patient to be transferred to facility by       Patient family notified on   of transfer.  Name of family member notified:        PHYSICIAN       Additional Comment:    _______________________________________________ Montavis Schubring, Veronia Beets, LCSW 03/18/2018, 4:46 PM

## 2018-03-18 NOTE — Evaluation (Signed)
Physical Therapy Evaluation Patient Details Name: Roger Rodriguez MRN: [id number] DOB: [birth year] Today's Date: 03/18/2018   History of Present Illness  77 y/o male here with R total knee hardwear removal and antibiotic spacer placement 9/5.  He has had a swollen R knee for 5+ months and struggled significantly with function.  H/o Parkinson's, prostate cancer with incontinence issues.   Clinical Impression  Pt eager to work with PT and try to get back to some level of independent function after many months of being very limited secondary to R knee pain (infection).  He has 24/7 assist at home but had been very limited struggling to get to the bathroom, and only really out of home for MD appointments.  He apparently never got full ROM back in R knee after TKA (7 years ago) and continues to lack TKE with current spacer.  Pt showed good effort with 15 minutes of supine exercises as well as transfer/mobility training apart from the PT exam.   Pt would benefit from STR at time of discharge secondary to severe mobility limitations.   Follow Up Recommendations SNF    Equipment Recommendations       Recommendations for Other Services       Precautions / Restrictions Precautions Precautions: Fall;Knee Required Braces or Orthoses: (Per surgeon, does not need KI) Restrictions Weight Bearing Restrictions: Yes RLE Weight Bearing: Partial weight bearing RLE Partial Weight Bearing Percentage or Pounds: 30%(per MD verbal order) Other Position/Activity Restrictions: ROM as tolerated w/o overpressure      Mobility  Bed Mobility Overal bed mobility: Needs Assistance Bed Mobility: Sit to Supine;Supine to Sit     Supine to sit: Mod assist Sit to supine: Mod assist   General bed mobility comments: Pt was able to maintain sitting balance well once assisted to position, BP 118/75 in sitting  Transfers Overall transfer level: Needs assistance Equipment used: Rolling walker (2 wheeled) Transfers: Sit  to/from Stand Sit to Stand: Mod assist;Max assist         General transfer comment: Elevated bed, +2 assist to insure maintanance of 30% PWBing on R and for general safety.  Pt was unable to get R LE extended and was highly reliant on walker to maintain upright  Ambulation/Gait             General Gait Details: unable/inappropriate - unable to lift/slide R LE w/o direct assist  Stairs            Wheelchair Mobility    Modified Rankin (Stroke Patients Only)       Balance Overall balance assessment: Needs assistance Sitting-balance support: Bilateral upper extremity supported;Feet supported Sitting balance-Leahy Scale: Good Sitting balance - Comments: Pt able to maintain sitting balance relatively well   Standing balance support: Bilateral upper extremity supported Standing balance-Leahy Scale: Poor Standing balance comment: Pt struggled with standing needing constant assist, but did ultimately show good effort and ability to maintain upright for ~2 minutes.                             Pertinent Vitals/Pain Pain Assessment: 0-10 Pain Score: 4     Home Living Family/patient expects to be discharged to:: Skilled nursing facility Living Arrangements: Other (Comment)(has 24/7 caregiver support)               Additional Comments: has lift bed (no rails), RW, transport chair    Prior Function Level of Independence: Needs assistance  Gait / Transfers Assistance Needed: Pt has been able to do some very limited walking recently, does get out (usually MD appointments) at least weekly in w/c  ADL's / Homemaking Assistance Needed: Has 24/7 aides, assist with most tasks        Hand Dominance        Extremity/Trunk Assessment   Upper Extremity Assessment Upper Extremity Assessment: Generalized weakness    Lower Extremity Assessment Lower Extremity Assessment: Generalized weakness(R PROM 8-70, functionally limited b/l R>L)        Communication   Communication: No difficulties  Cognition Arousal/Alertness: Awake/alert Behavior During Therapy: WFL for tasks assessed/performed Overall Cognitive Status: Within Functional Limits for tasks assessed                                        General Comments      Exercises General Exercises - Lower Extremity Ankle Circles/Pumps: AROM;10 reps Quad Sets: AROM;10 reps Gluteal Sets: AROM;10 reps Heel Slides: AAROM;5 reps Hip ABduction/ADduction: AROM;10 reps   Assessment/Plan    PT Assessment Patient needs continued PT services  PT Problem List Decreased strength;Decreased range of motion;Decreased activity tolerance;Decreased balance;Decreased mobility;Decreased coordination;Decreased cognition;Decreased knowledge of use of DME;Decreased safety awareness;Pain       PT Treatment Interventions Gait training;DME instruction;Functional mobility training;Therapeutic activities;Therapeutic exercise;Balance training;Neuromuscular re-education;Patient/family education    PT Goals (Current goals can be found in the Care Plan section)       Frequency 7X/week   Barriers to discharge        Co-evaluation               AM-PAC PT "6 Clicks" Daily Activity  Outcome Measure Difficulty turning over in bed (including adjusting bedclothes, sheets and blankets)?: Unable Difficulty moving from lying on back to sitting on the side of the bed? : Unable Difficulty sitting down on and standing up from a chair with arms (e.g., wheelchair, bedside commode, etc,.)?: Unable Help needed moving to and from a bed to chair (including a wheelchair)?: Total Help needed walking in hospital room?: Total Help needed climbing 3-5 steps with a railing? : Total 6 Click Score: 6    End of Session Equipment Utilized During Treatment: Gait belt Activity Tolerance: Patient limited by fatigue Patient left: with bed alarm set;with call bell/phone within reach   PT Visit  Diagnosis: Muscle weakness (generalized) (M62.81);Difficulty in walking, not elsewhere classified (R26.2);Pain Pain - Right/Left: Right Pain - part of body: Knee    Time: 5993-5701 PT Time Calculation (min) (ACUTE ONLY): 54 min   Charges:   PT Evaluation $PT Eval Moderate Complexity: 1 Mod PT Treatments $Therapeutic Exercise: 8-22 mins $Therapeutic Activity: 8-22 mins        Kreg Shropshire, DPT 03/18/2018, 4:49 PM

## 2018-03-19 LAB — CBC
HCT: 29 % — ABNORMAL LOW (ref 40.0–52.0)
Hemoglobin: 10 g/dL — ABNORMAL LOW (ref 13.0–18.0)
MCH: 30.9 pg (ref 26.0–34.0)
MCHC: 34.5 g/dL (ref 32.0–36.0)
MCV: 89.5 fL (ref 80.0–100.0)
Platelets: 243 10*3/uL (ref 150–440)
RBC: 3.24 MIL/uL — ABNORMAL LOW (ref 4.40–5.90)
RDW: 14.5 % (ref 11.5–14.5)
WBC: 7.8 10*3/uL (ref 3.8–10.6)

## 2018-03-19 LAB — BASIC METABOLIC PANEL
Anion gap: 6 (ref 5–15)
BUN: 25 mg/dL — ABNORMAL HIGH (ref 8–23)
CO2: 26 mmol/L (ref 22–32)
Calcium: 7.9 mg/dL — ABNORMAL LOW (ref 8.9–10.3)
Chloride: 101 mmol/L (ref 98–111)
Creatinine, Ser: 1.11 mg/dL (ref 0.61–1.24)
GFR calc Af Amer: >60 mL/min (ref >60)
GFR calc non Af Amer: >60 mL/min (ref >60)
Glucose, Bld: 189 mg/dL — ABNORMAL HIGH (ref 70–99)
Potassium: 4.1 mmol/L (ref 3.5–5.1)
Sodium: 133 mmol/L — ABNORMAL LOW (ref 135–145)

## 2018-03-19 LAB — GLUCOSE, CAPILLARY: Glucose-Capillary: 221 mg/dL — ABNORMAL HIGH (ref 70–99)

## 2018-03-19 MED ORDER — POLYETHYLENE GLYCOL 3350 17 G PO PACK: 17.0000 g | PACK | Freq: Every day | ORAL | Status: DC

## 2018-03-19 MED ORDER — POLYETHYLENE GLYCOL 3350 17 G PO PACK: 17.0000 g | PACK | Freq: Every day | ORAL | Status: DC | PRN

## 2018-03-19 NOTE — Progress Notes (Signed)
Spoke with Rx and rescheduled pt home med Rytara to home schedule per pt/caregiver request. Pt/caregiver education given r/t generic name of this drug and states "he doesn't take carb-levodopa anymore"

## 2018-03-19 NOTE — Progress Notes (Signed)
[place] [facility] [place] NAME: Roger Rodriguez    MR#:  [id number]  DATE OF BIRTH:  29-Feb-1941  SUBJECTIVE:  CHIEF COMPLAINT: Patient is resting comfortably.  Denies any lightheadedness today.  No other complaints.  PICC line placed Patient's caregiver Ms. [name] at bedside  REVIEW OF SYSTEMS:  CONSTITUTIONAL: No fever, fatigue or weakness.  EYES: No blurred or double vision.  EARS, NOSE, AND THROAT: No tinnitus or ear pain.  RESPIRATORY: No cough, shortness of breath, wheezing or hemoptysis.  CARDIOVASCULAR: No chest pain, orthopnea, edema.  GASTROINTESTINAL: No nausea, vomiting, diarrhea or abdominal pain.  GENITOURINARY: No dysuria, hematuria.  ENDOCRINE: No polyuria, nocturia,  HEMATOLOGY: No anemia, easy bruising or bleeding SKIN: No rash or lesion. MUSCULOSKELETAL: No joint pain  NEUROLOGIC: No tingling, numbness, weakness.  PSYCHIATRY: No anxiety or depression.   DRUG ALLERGIES:  No Known Allergies  VITALS:  Blood pressure 134/67, pulse 87, temperature 98.2 F (36.8 C), temperature source Oral, resp. rate 19, height 5\' 11"  (1.803 m), weight 95.7 kg, SpO2 98 %.  PHYSICAL EXAMINATION:  GENERAL:  77 y.o.-year-old patient lying in the bed with no acute distress.  EYES: Pupils equal, round, reactive to light and accommodation. No scleral icterus. Extraocular muscles intact.  HEENT: Head atraumatic, normocephalic. Oropharynx and nasopharynx clear.  NECK:  Supple, no jugular venous distention. No thyroid enlargement, no tenderness.  LUNGS: Normal breath sounds bilaterally, no wheezing, rales,rhonchi or crepitation. No use of accessory muscles of respiration.  CARDIOVASCULAR: S1, S2 normal. No murmurs, rubs, or gallops.  ABDOMEN: Soft, nontender, nondistended. Bowel sounds present.  EXTREMITIES: No pedal edema, cyanosis, or clubbing.  NEUROLOGIC: Cranial nerves II through XII are intact.  Right knee in clean dressing ,sensation  intact. Gait not checked.  PSYCHIATRIC: The patient is alert and oriented x 3.  SKIN: No obvious rash, lesion, or ulcer.    LABORATORY PANEL:   CBC Recent Labs  Lab 03/19/18 0417  WBC 7.8  HGB 10.0*  HCT 29.0*  PLT 243   ------------------------------------------------------------------------------------------------------------------  Chemistries  Recent Labs  Lab 03/19/18 0417  NA 133*  K 4.1  CL 101  CO2 26  GLUCOSE 189*  BUN 25*  CREATININE 1.11  CALCIUM 7.9*   ------------------------------------------------------------------------------------------------------------------  Cardiac Enzymes Recent Labs  Lab 03/18/18 0957  TROPONINI <0.03   ------------------------------------------------------------------------------------------------------------------  RADIOLOGY:  Dg Knee Right Port  Result Date: 03/17/2018 CLINICAL DATA:  77 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 New prostheses likely consisting of antibiotic impregnated cement are present in the tibial plateau and overlying the distal femoral condyles. Associated soft tissue swelling and air within the joint space. A wound VAC is present laterally at the knee joint. IMPRESSION: Surgical changes of revision knee arthroplasty with removal of prior hardware and placement of a temporary prosthesis likely consisting of antibiotic impregnated cement. Electronically Signed   By: Jacqulynn Cadet M.D.   On: 03/17/2018 17:26   [place] Ekg Site Rite  Result Date: 03/17/2018 If Site Rite image not attached, placement could not be confirmed due to current cardiac rhythm.   EKG:   Orders placed or performed during the hospital encounter of 10/12/17  . ED EKG  . ED EKG    ASSESSMENT AND PLAN:    Hypotension-  due to volume  depletion. -Clinically improving has received 2 L of normal saline  fluid boluses and getting IV fluids - 2L NS bolus ordered (no history of heart failure), will then start IVFs -Lactic acid is trending down 2.1-1.4 -Troponin less than 0.03 - follow blood pressures closely - already on vanc/zosyn and afebrile so will not order blood cultures - holding home lisinopril  Acute kidney injury-prerenal  Cr  0.99 to 1.34 -1.11 today.  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 Management plans discussed with the patient, family and they are in agreement.  CODE STATUS: FC   TOTAL TIME TAKING CARE OF THIS PATIENT: 36  minutes.    Note: This dictation was prepared with Dragon dictation along with smaller phrase technology. Any transcriptional errors that result from this process are unintentional.   Nicholes Mango M.D on 03/19/2018 at 2:59 PM  Between 7am to 6pm - Pager - [phone] After 6pm go to [email or web address] - password [email or web address] [facility]  [facility]  Office  [phone]  CC: Primary care physician; [name]., MD

## 2018-03-19 NOTE — Progress Notes (Signed)
PICC Line Insertion Procedure Note  Procedure: Insertion of #4 FR/18G PICC  Indications:  Long Term IV therapy  Procedure Details  Informed consent was obtained for the procedure, including sedation.  Risks of lung perforation, hemorrhage, and adverse drug reaction were discussed.   Maximum sterile technique was used including antiseptics, cap, gloves, gown, hand hygiene, mask and sheet.  #4 FR/18G PICC inserted to the R Basilic vein per hospital protocol.   Blood return:  yes  Findings: Catheter inserted to 46 cm, with 2 cm. Exposed. Mid upper arm circumference is 32 cm.  There were no changes to vital signs. Catheter was flushed with 10 cc NS. Patient did tolerate procedure well.  Recommendations: ECG image for tip confirmation placed on paper chart. PICC Brochure given to patient with teaching instruction.

## 2018-03-19 NOTE — Progress Notes (Signed)
Physical Therapy Treatment Patient Details Name: Roger Rodriguez MRN: [id number] DOB: [birth year] Today's Date: 03/19/2018    History of Present Illness 76 y/o male here with R total knee hardwear removal and antibiotic spacer placement 9/5.  He has had a swollen R knee for 5+ months and struggled significantly with function.  H/o Parkinson's, prostate cancer with incontinence issues.     PT Comments    Pt premedicated prior to session.  Participated in exercises as described below.  To edge of bed with max a x 2.  Able to sit unsupported without LOB.  PT stood with walker x 1 with max a x 2.  He was able to maintain WB status with foot on writers.  After seated rest, he requested to use bedside commode.  Stand pivot to commode with max a x 2 with no results.  Repositioned in bed to comfort with max a x 2.  Personal care aid in and very involved in care.    Follow Up Recommendations  SNF     Equipment Recommendations       Recommendations for Other Services       Precautions / Restrictions Precautions Precautions: Fall;Knee Restrictions Weight Bearing Restrictions: Yes RLE Weight Bearing: Partial weight bearing RLE Partial Weight Bearing Percentage or Pounds: 30 Other Position/Activity Restrictions: ROM as tolerated w/o overpressure    Mobility  Bed Mobility Overal bed mobility: Needs Assistance Bed Mobility: Sit to Supine;Supine to Sit     Supine to sit: Mod assist;+2 for physical assistance;Max assist Sit to supine: Mod assist;Max assist;+2 for physical assistance   General bed mobility comments: reliant on staff/personal aide for mobility  Transfers Overall transfer level: Needs assistance Equipment used: Rolling walker (2 wheeled);None Transfers: Sit to/from [facility] Sit to Stand: Max assist;+2 physical assistance Stand pivot transfers: Max assist;+2 physical assistance       General transfer comment: stood with walker but unable to turn with  walker to commode so 2 person stand person [employer]  Ambulation/Gait             General Gait Details: unable/inappropriate   Marine scientist Rankin (Stroke Patients Only)       Balance Overall balance assessment: Needs assistance Sitting-balance support: Bilateral upper extremity supported;Feet supported Sitting balance-Leahy Scale: Good Sitting balance - Comments: Pt able to maintain sitting balance relatively well   Standing balance support: Bilateral upper extremity supported Standing balance-Leahy Scale: Poor                              Cognition Arousal/Alertness: Awake/alert Behavior During Therapy: WFL for tasks assessed/performed Overall Cognitive Status: Within Functional Limits for tasks assessed                                        Exercises Other Exercises Other Exercises: AAROM for ankle pumps, heels slides, SLR, LAQ, quad sets     General Comments        Pertinent Vitals/Pain Pain Assessment: Faces Faces Pain Scale: Hurts even more Pain Location: generally uncomfortable with movement/transfer.  "I'm seeing stars" x 2 during transfering Pain Descriptors / Indicators: Operative site guarding;Sore;Constant;Aching Pain Intervention(s): Limited activity within patient's tolerance;Premedicated before session;Monitored during session;Repositioned    Home Living  Prior Function            PT Goals (current goals can now be found in the care plan section) Progress towards PT goals: Progressing toward goals    Frequency    7X/week      PT Plan Current plan remains appropriate    Co-evaluation              AM-PAC PT "6 Clicks" Daily Activity  Outcome Measure  Difficulty turning over in bed (including adjusting bedclothes, sheets and blankets)?: Unable Difficulty moving from lying on back to sitting on the side of the bed? :  Unable Difficulty sitting down on and standing up from a chair with arms (e.g., wheelchair, bedside commode, etc,.)?: Unable Help needed moving to and from a bed to chair (including a wheelchair)?: Total Help needed walking in hospital room?: Total Help needed climbing 3-5 steps with a railing? : Total 6 Click Score: 6    End of Session Equipment Utilized During Treatment: Gait belt Activity Tolerance: Patient limited by pain Patient left: in bed;with bed alarm set;with call bell/phone within reach;with nursing/sitter in room;with family/visitor present Nurse Communication: Other (comment) Pain - Right/Left: Right Pain - part of body: Knee     Time: 6015-6153 PT Time Calculation (min) (ACUTE ONLY): 45 min  Charges:  $Therapeutic Exercise: 8-22 mins $Therapeutic Activity: 23-37 mins                     Chesley Noon, PTA 03/19/18, 12:51 PM

## 2018-03-19 NOTE — Progress Notes (Signed)
Subjective: 2 Days Post-Op Procedure(s) (LRB): TOTAL KNEE REVISION (Right)    Patient reports pain as mild. Getting PICC line now.  Proteus still only organism grown.   Objective:   VITALS:   Vitals:   03/18/18 2321 03/19/18 0748  BP: 107/60 109/65  Pulse: 82 85  Resp: 19   Temp: 97.7 F (36.5 C) 98.1 F (36.7 C)  SpO2: 95% 97%    Neurologically intact ABD soft Neurovascular intact Sensation intact distally Intact pulses distally Dorsiflexion/Plantar flexion intact  LABS Recent Labs    03/17/18 1212 03/18/18 0503 03/19/18 0417  HGB 12.3* 10.6* 10.0*  HCT 36.7* 31.4* 29.0*  WBC 7.9 9.5 7.8  PLT 286 300 243    Recent Labs    03/17/18 1212 03/18/18 0503 03/19/18 0417  NA 135 132* 133*  K 4.5 4.6 4.1  BUN 23 29* 25*  CREATININE 0.99 1.34* 1.11  GLUCOSE 171* 304* 189*    Recent Labs    03/17/18 1212  INR 1.08     Assessment/Plan: 2 Days Post-Op Procedure(s) (LRB): TOTAL KNEE REVISION (Right)   Advance diet Up with therapy Discharge to SNF

## 2018-03-19 NOTE — Progress Notes (Signed)
Nt alerted this writer that caregiver trying to reprogram IV pump to stop beeping. Caregiver stated she was a Marine scientist. Education given. CN aware if incident.

## 2018-03-19 NOTE — Progress Notes (Signed)
Upon entering Pt room sleeping, no c/o of pain at this time.

## 2018-03-20 ENCOUNTER — Other Ambulatory Visit: Payer: Self-pay | Admitting: Family Medicine

## 2018-03-20 ENCOUNTER — Inpatient Hospital Stay: Payer: PPO

## 2018-03-20 DIAGNOSIS — N453 Epididymo-orchitis: Secondary | ICD-10-CM

## 2018-03-20 DIAGNOSIS — I10 Essential (primary) hypertension: Secondary | ICD-10-CM

## 2018-03-20 DIAGNOSIS — N471 Phimosis: Secondary | ICD-10-CM

## 2018-03-20 LAB — BASIC METABOLIC PANEL
Anion gap: 7 (ref 5–15)
BUN: 14 mg/dL (ref 8–23)
CO2: 28 mmol/L (ref 22–32)
Calcium: 8.1 mg/dL — ABNORMAL LOW (ref 8.9–10.3)
Chloride: 98 mmol/L (ref 98–111)
Creatinine, Ser: 0.99 mg/dL (ref 0.61–1.24)
GFR calc Af Amer: >60 mL/min (ref >60)
GFR calc non Af Amer: >60 mL/min (ref >60)
Glucose, Bld: 163 mg/dL — ABNORMAL HIGH (ref 70–99)
Potassium: 4 mmol/L (ref 3.5–5.1)
Sodium: 133 mmol/L — ABNORMAL LOW (ref 135–145)

## 2018-03-20 LAB — CBC
HCT: 29 % — ABNORMAL LOW (ref 40.0–52.0)
Hemoglobin: 9.7 g/dL — ABNORMAL LOW (ref 13.0–18.0)
MCH: 29.9 pg (ref 26.0–34.0)
MCHC: 33.5 g/dL (ref 32.0–36.0)
MCV: 89.2 fL (ref 80.0–100.0)
Platelets: 257 10*3/uL (ref 150–440)
RBC: 3.25 MIL/uL — ABNORMAL LOW (ref 4.40–5.90)
RDW: 14.5 % (ref 11.5–14.5)
WBC: 7.3 10*3/uL (ref 3.8–10.6)

## 2018-03-20 MED ORDER — NYSTATIN 100000 UNIT/GM EX POWD
Freq: Three times a day (TID) | CUTANEOUS | Status: DC
  Administered 2018-03-20 – 2018-03-21 (×3): via TOPICAL
  Filled 2018-03-20: qty 15

## 2018-03-20 MED ORDER — LISINOPRIL 5 MG PO TABS
2.5000 mg | ORAL_TABLET | Freq: Every day | ORAL | Status: DC
  Administered 2018-03-21: 2.5 mg via ORAL
  Filled 2018-03-20 (×2): qty 1

## 2018-03-20 NOTE — Consult Note (Signed)
H&P Physician requesting consult: Nicholes Mango, MD  Chief Complaint: Epididymoorchitis  History of Present Illness: 77 year old male who has had a swollen right knee for 5+ months and struggled significantly with function.  He has a history of Parkinson's disease.  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  He was noted to have scrotal pain and swelling.  He has history of prostate cancer status post prostatectomy in the past in 2006 by Dr. Quillian Quince and has chronic urinary incontinence requiring multiple pull-ups a day.  He was last seen by Dr. Erlene Quan in October 2018.  After noting scrotal pain and swelling, the patient underwent a scrotal ultrasound which revealed bilateral epididymal orchitis.  He is currently on Zosyn for his septic joint.  No leukocytosis.  Medicine team has also ordered nystatin powder.  Past Medical History:  Diagnosis Date  . Allergy    allergic rhinitis  . Arthritis    OA  . Chronic kidney disease    stones  . Depression   . Family history of adverse reaction to anesthesia   . Hematuria   . History of kidney stones   . Hyperlipidemia   . Hypertension    no meds since parkinsons meds started  . Incontinence of urine   . Inguinal hernia   . Neuromuscular disorder ([place])    parkinsons  . Overactive bladder   . Parkinson's disease ([place])   . Prostate cancer ([place])   . Rhinitis, allergic   . Shortness of breath dyspnea    doe secondary to parkinsons   Past Surgical History:  Procedure Laterality Date  . BACK SURGERY  2014  . CYSTOSCOPY WITH STENT PLACEMENT Right 01/13/2016   Procedure: CYSTOSCOPY WITH STENT PLACEMENT;  Surgeon: Hollice Espy, MD;  Location: [facility] OR[facility];  Service: Urology;  Laterality: Right;  . CYSTOSCOPY/URETEROSCOPY/HOLMIUM LASER/STENT PLACEMENT Left 11/20/2015   Procedure: CYSTOSCOPY/URETEROSCOPY/HOLMIUM LASER/STENT PLACEMENT;  Surgeon: Hollice Espy, MD;  Location:  [facility];  Service: Urology;  Laterality: Left;  . EYE SURGERY    . FRACTURE SURGERY      ankle   . H/O arthroscopy of right knee  2015  . H/O radical protatectomy    . HERNIA REPAIR    . JOINT REPLACEMENT     right knee  . LITHOTRIPSY  2010  . Rotator Cuff tear  1993   Repaired  . TOTAL KNEE REVISION Right 03/17/2018   Procedure: TOTAL KNEE REVISION;  Surgeon: Lovell Sheehan, MD;  Location: [facility] OR[facility];  Service: Orthopedics;  Laterality: Right;  . URETEROSCOPY WITH HOLMIUM LASER LITHOTRIPSY  2002?  [name] URETEROSCOPY WITH HOLMIUM LASER LITHOTRIPSY Right 01/13/2016   Procedure: URETEROSCOPY WITH HOLMIUM LASER LITHOTRIPSY;  Surgeon: Hollice Espy, MD;  Location: [facility];  Service: Urology;  Laterality: Right;  [name] VASECTOMY     History    Home Medications:  Medications Prior to Admission  Medication Sig Dispense Refill Last Dose  . acetaminophen-codeine (TYLENOL #3) 300-30 MG tablet Take 1 tablet by mouth every 8 (eight) hours as needed for moderate pain.    Past Week at Unknown time  . budesonide (RHINOCORT AQUA) 32 MCG/ACT nasal spray USE 1 PUFF IN EACH NOSTRIL EVERY DAY AS NEEDED (Patient taking differently: Place 1 spray into both nostrils 2 (two) times daily. ) 1 Bottle 5 03/16/2018 at Unknown time  . cholecalciferol (VITAMIN D) 1000 units tablet Take 1,000 Units by mouth daily.    03/16/2018 at Unknown time  .  gabapentin (NEURONTIN) 400 MG capsule Take 400 mg by mouth 3 (three) times daily.   2 03/16/2018 at Unknown time  . Hypromellose (ARTIFICIAL TEARS OP) Place 1 drop into both eyes 4 (four) times daily as needed (for dry eyes).    03/16/2018 at Unknown time  . lisinopril (PRINIVIL,ZESTRIL) 2.5 MG tablet Take 2.5 mg by mouth 2 (two) times daily.    03/17/2018 at Unknown time  . loratadine (CLARITIN) 10 MG tablet Take 10 mg by mouth daily.   03/16/2018 at Unknown time  . Melatonin 3 MG TABS Take 3 mg by mouth at bedtime as needed (for sleep).    03/16/2018 at Unknown time  . mirabegron ER (MYRBETRIQ)  25 MG TB24 tablet Myrbetriq 25 mg tablet,extended release  TAKE 1 TABLET (25 MG TOTAL) 2 (TWO) TIMES DAILY BY MOUTH. (Patient taking differently: Take 25 mg by mouth 2 (two) times daily. ) 30 tablet 11 03/16/2018 at Unknown time  . Multiple Vitamin (MULTIVITAMIN WITH MINERALS) TABS tablet Take 1 tablet by mouth daily.   03/16/2018 at Unknown time  . naproxen (NAPROSYN) 375 MG tablet Take 375 mg by mouth 2 (two) times daily.   03/16/2018 at Unknown time  . pramipexole (MIRAPEX) 0.5 MG tablet Take 2 tablets (1 mg total) by mouth at bedtime. (Patient taking differently: Take 0.25-0.5 mg by mouth See admin instructions. Take 0.25 mg by mouth twice daily. Take 0.5 mg by mouth at bedtime.) 30 tablet 0 03/16/2018 at Unknown time  . RYTARY 23.75-95 MG CPCR Take 3 capsules by mouth 4 (four) times daily.   3 03/17/2018 at Unknown time  . vitamin B-12 (CYANOCOBALAMIN) 1000 MCG tablet Take 1,000 mcg by mouth daily.   03/16/2018 at Unknown time  . vitamin C (ASCORBIC ACID) 500 MG tablet Take 500 mg by mouth daily.   03/16/2018 at Unknown time  . vitamin E (VITAMIN E) 400 UNIT capsule Take 400 Units by mouth daily.    03/16/2018 at Unknown time  . chlorpheniramine-HYDROcodone (TUSSIONEX PENNKINETIC ER) 10-8 MG/5ML SUER Take 5 mLs by mouth at bedtime as needed for cough. (Patient not taking: Reported on 03/02/2018) 140 mL 0 Completed Course at Unknown time  . triamcinolone cream (KENALOG) 0.1 % Apply 1 application topically 2 (two) times daily. (Patient not taking: Reported on 03/16/2018) 30 g 0 Not Taking at Unknown time   Allergies: No Known Allergies  Family History  Problem Relation Age of Onset  . Dementia Mother   . Cerebral aneurysm Father   . Bladder Cancer Paternal Uncle   . Liver cancer Sister   . Prostate cancer Neg Hx   . Kidney cancer Neg Hx    Social History:  reports that he quit smoking about 49 years ago. His smoking use included cigars. He quit smokeless tobacco use about 14 years ago.  His smokeless tobacco  use included chew. He reports that he does not drink alcohol or use drugs.  ROS: A complete review of systems was performed.  All systems are negative except for pertinent findings as noted. ROS   Physical Exam:  Vital signs in last 24 hours: Temp:  [98.7 F (37.1 C)-99.5 F (37.5 C)] 99.5 F (37.5 C) (09/08 1603) Pulse Rate:  [83-94] 94 (09/08 1603) Resp:  [17-19] 18 (09/08 1603) BP: (115-132)/(59-69) 130/59 (09/08 1603) SpO2:  [95 %-96 %] 96 % (09/08 1603) General:  Alert and oriented, No acute distress HEENT: Normocephalic, atraumatic Neck: No JVD or lymphadenopathy Cardiovascular: Regular rate and rhythm Lungs: Regular  rate and effort Abdomen: Soft, nontender, nondistended, no abdominal masses Genitourinary: Patient has an uncircumcised phallus.  He currently had paraphimosis.  I had to reduce this manually.  There was some erythema of the scrotum and tenderness in the testicles.  There was no crepitus.  No evidence of abscess.  No evidence of necrotizing infection. Back: No CVA tenderness Extremities: No edema Neurologic: Grossly intact  Laboratory Data:  Results for orders placed or performed during the hospital encounter of 03/17/18 (from the past 24 hour(s))  CBC     Status: Abnormal   Collection Time: 03/20/18  5:27 AM  Result Value Ref Range   WBC 7.3 3.8 - 10.6 K/uL   RBC 3.25 (L) 4.40 - 5.90 MIL/uL   Hemoglobin 9.7 (L) 13.0 - 18.0 g/dL   HCT 29.0 (L) 40.0 - 52.0 %   MCV 89.2 80.0 - 100.0 fL   MCH 29.9 26.0 - 34.0 pg   MCHC 33.5 32.0 - 36.0 g/dL   RDW 14.5 11.5 - 14.5 %   Platelets 257 150 - 440 K/uL  Basic metabolic panel     Status: Abnormal   Collection Time: 03/20/18  5:27 AM  Result Value Ref Range   Sodium 133 (L) 135 - 145 mmol/L   Potassium 4.0 3.5 - 5.1 mmol/L   Chloride 98 98 - 111 mmol/L   CO2 28 22 - 32 mmol/L   Glucose, Bld 163 (H) 70 - 99 mg/dL   BUN 14 8 - 23 mg/dL   Creatinine, Ser 0.99 0.61 - 1.24 mg/dL   Calcium 8.1 (L) 8.9 - 10.3 mg/dL    GFR calc non Af Amer >60 >60 mL/min   GFR calc Af Amer >60 >60 mL/min   Anion gap 7 5 - 15   Recent Results (from the past 240 hour(s))  Aerobic Culture (superficial specimen)     Status: None   Collection Time: 03/11/18  1:46 PM  Result Value Ref Range Status   Specimen Description   Final    KNEE right Performed at [facility], [address] [place], [place] [address]    Special Requests   Final    NONE Performed at [facility], [address] [place], [place] [address]    Gram Stain   Final    FEW WBC PRESENT, PREDOMINANTLY PMN RARE GRAM POSITIVE COCCI    Culture FEW PROTEUS MIRABILIS  Final   Report Status 03/14/2018 FINAL  Final   Organism ID, Bacteria PROTEUS MIRABILIS  Final      Susceptibility   Proteus mirabilis - MIC*    AMPICILLIN <=2 SENSITIVE Sensitive     CEFAZOLIN <=4 SENSITIVE Sensitive     CEFEPIME <=1 SENSITIVE Sensitive     CEFTAZIDIME <=1 SENSITIVE Sensitive     CEFTRIAXONE <=1 SENSITIVE Sensitive     CIPROFLOXACIN <=0.25 SENSITIVE Sensitive     GENTAMICIN <=1 SENSITIVE Sensitive     IMIPENEM 2 SENSITIVE Sensitive     TRIMETH/SULFA <=20 SENSITIVE Sensitive     AMPICILLIN/SULBACTAM <=2 SENSITIVE Sensitive     PIP/TAZO <=4 SENSITIVE Sensitive     * FEW PROTEUS MIRABILIS  Aerobic/Anaerobic Culture (surgical/deep wound)     Status: None (Preliminary result)   Collection Time: 03/17/18  2:15 PM  Result Value Ref Range Status   Specimen Description FLUID RIGHT KNEE  Final   Special Requests   Final    DEEP,SURGICAL Performed at [facility], [address], [place], [place] [address]  Gram Stain   Final    MODERATE WBC PRESENT, PREDOMINANTLY MONONUCLEAR NO ORGANISMS SEEN Performed at [facility] [address] [place], [place] [address]    Culture   Final    RARE PROTEUS MIRABILIS CRITICAL RESULT CALLED TO, READ BACK BY AND VERIFIED WITH: Linwood Dibbles RN, AT 1310 03/19/18 BY D. VANHOOK REGARDING CULTURE  GROWTH NO ANAEROBES ISOLATED; CULTURE IN PROGRESS FOR 5 DAYS    Report Status PENDING  Incomplete   Organism ID, Bacteria PROTEUS MIRABILIS  Final      Susceptibility   Proteus mirabilis - MIC*    AMPICILLIN <=2 SENSITIVE Sensitive     CEFAZOLIN <=4 SENSITIVE Sensitive     CEFEPIME <=1 SENSITIVE Sensitive     CEFTAZIDIME <=1 SENSITIVE Sensitive     CEFTRIAXONE <=1 SENSITIVE Sensitive     CIPROFLOXACIN <=0.25 SENSITIVE Sensitive     GENTAMICIN <=1 SENSITIVE Sensitive     IMIPENEM 2 SENSITIVE Sensitive     TRIMETH/SULFA <=20 SENSITIVE Sensitive     AMPICILLIN/SULBACTAM <=2 SENSITIVE Sensitive     PIP/TAZO <=4 SENSITIVE Sensitive     * RARE PROTEUS MIRABILIS  Aerobic/Anaerobic Culture (surgical/deep wound)     Status: None (Preliminary result)   Collection Time: 03/17/18  2:24 PM  Result Value Ref Range Status   Specimen Description TISSUE SYNOVIAL RIGHT KNEE  Final   Special Requests   Final    NONE Performed at [facility], [address] [place], [place] [address]    Gram Stain   Final    ABUNDANT WBC PRESENT, PREDOMINANTLY PMN NO ORGANISMS SEEN Performed at [facility], [place] [address], [place], [place] [address]    Culture   Final    RARE PROTEUS MIRABILIS CRITICAL RESULT CALLED TO, READ BACK BY AND VERIFIED WITH: Linwood Dibbles RN, AT 5859 03/19/18 BY D. VANHOOK REGARDING CULTURE GROWTH NO ANAEROBES ISOLATED; CULTURE IN PROGRESS FOR 5 DAYS    Report Status PENDING  Incomplete   Creatinine: Recent Labs    03/17/18 1212 03/18/18 0503 03/19/18 0417 03/20/18 0527  CREATININE 0.99 1.34* 1.11 0.99    Impression/Assessment:  Bilateral epididymoorchitis Scrotal cellulitis Paraphimosis  Plan:  Agree with continuing Zosyn.  Agree with nystatin powder for possible overlying candidal infection from chronic urinary incontinence.  Try to keep the area dry and clean.  Also had to reduce the paraphimosis at the bedside.  Marton Redwood, III 03/20/2018, 6:45 PM

## 2018-03-20 NOTE — Progress Notes (Signed)
RN changed dressing over site to ace due to urine on Webril and cotton dressing.

## 2018-03-20 NOTE — Progress Notes (Signed)
[facility] NAME: Roger Rodriguez    MR#:  [id number]  DATE OF BIRTH:  01-Mar-1941  SUBJECTIVE:  CHIEF COMPLAINT: Patient is c/o scrotal burning and pain intermittently No other complaints.  PICC line placed Patient's caregiver Ms. [name] at bedside  REVIEW OF SYSTEMS:  CONSTITUTIONAL: No fever, fatigue or weakness.  EYES: No blurred or double vision.  EARS, NOSE, AND THROAT: No tinnitus or ear pain.  RESPIRATORY: No cough, shortness of breath, wheezing or hemoptysis.  CARDIOVASCULAR: No chest pain, orthopnea, edema.  GASTROINTESTINAL: No nausea, vomiting, diarrhea or abdominal pain.  GENITOURINARY: No dysuria, hematuria.  ENDOCRINE: No polyuria, nocturia,  HEMATOLOGY: No anemia, easy bruising or bleeding SKIN: No rash or lesion. MUSCULOSKELETAL: No joint pain  NEUROLOGIC: No tingling, numbness, weakness.  PSYCHIATRY: No anxiety or depression.   DRUG ALLERGIES:  No Known Allergies  VITALS:  Blood pressure 132/69, pulse 83, temperature 98.7 F (37.1 C), temperature source Oral, resp. rate 17, height 5\' 11"  (1.803 m), weight 95.7 kg, SpO2 96 %.  PHYSICAL EXAMINATION:  GENERAL:  77 y.o.-year-old patient lying in the bed with no acute distress.  EYES: Pupils equal, round, reactive to light and accommodation. No scleral icterus. Extraocular muscles intact.  HEENT: Head atraumatic, normocephalic. Oropharynx and nasopharynx clear.  NECK:  Supple, no jugular venous distention. No thyroid enlargement, no tenderness.  LUNGS: Normal breath sounds bilaterally, no wheezing, rales,rhonchi or crepitation. No use of accessory muscles of respiration.  CARDIOVASCULAR: S1, S2 normal. No murmurs, rubs, or gallops.  ABDOMEN: Soft, nontender, nondistended. Bowel sounds present.  UROLOGY:scrotal erythema and min tenderness and some edema EXTREMITIES: No pedal edema, cyanosis, or clubbing.  NEUROLOGIC: Cranial nerves II through XII are intact.   Right knee in clean dressing ,sensation intact. Gait not checked.  PSYCHIATRIC: The patient is alert and oriented x 3.  SKIN: No obvious rash, lesion, or ulcer.    LABORATORY PANEL:   CBC Recent Labs  Lab 03/20/18 0527  WBC 7.3  HGB 9.7*  HCT 29.0*  PLT 257   ------------------------------------------------------------------------------------------------------------------  Chemistries  Recent Labs  Lab 03/20/18 0527  NA 133*  K 4.0  CL 98  CO2 28  GLUCOSE 163*  BUN 14  CREATININE 0.99  CALCIUM 8.1*   ------------------------------------------------------------------------------------------------------------------  Cardiac Enzymes Recent Labs  Lab 03/18/18 0957  TROPONINI <0.03   ------------------------------------------------------------------------------------------------------------------  RADIOLOGY:  No results found.  EKG:   Orders placed or performed during the hospital encounter of 10/12/17  . ED EKG  . ED EKG    ASSESSMENT AND PLAN:    Hypotension-  due to volume depletion. iimproved with iv fluids -Lactic acid is trending down 2.1-1.4 -Troponin less than 0.03 - follow blood pressures closely - on zosyn and afebrile so will not order blood cultures  Intertrigo with scrotal edema US scrotum Nystatin and interdry Ice prn Might add diflucan if no improvement  Acute kidney injury-prerenal Resolved Resume Lisinopril 2.5 mg once a day  -   Right knee prosthetic joint infection- s/p total knee removal, placement of antibiotic spacer, placement of wound vac on 9/5 - per ortho - wound care consult -wound cx- proteus, pansensitive -PICC line placed 9/7, ID is following    All the records are reviewed and case discussed with Care Management/Social Workerr. Management plans discussed with the patient, family and they are in agreement.  CODE STATUS: FC   TOTAL TIME TAKING CARE OF THIS PATIENT: 36  minutes.  Note: This dictation  was prepared with Dragon dictation along with smaller phrase technology. Any transcriptional errors that result from this process are unintentional.   Nicholes Mango M.D on 03/20/2018 at 9:26 AM  Between 7am to 6pm - Pager - [phone] After 6pm go to [email or web address] - password [email or web address] [facility]  [facility]  Office  [phone]  CC: Primary care physician; [name]., MD

## 2018-03-20 NOTE — Clinical Social Work Note (Signed)
The CSW visited the patient and his caregiver at bedside to discuss bed offers. The patient chose [facility]. The CSW has updated such in the [place] and has called the on-call for [facility] to update them of the bed choice. The CSW will update with the authorization number when available. CSW is following for possible discharge tomorrow.  Roger Rodriguez, MSW, Latanya Presser [phone]

## 2018-03-20 NOTE — Progress Notes (Signed)
Patient scrotal ultrasound is revealing epididymitis.  Patient already on Zosyn we will continue the same and supportive care.  Urology consult placed and discussed with on-call urologist Dr. Gloriann Loan agreeable with the current plan of care

## 2018-03-20 NOTE — Progress Notes (Signed)
Subjective: 3 Days Post-Op Procedure(s) (LRB): TOTAL KNEE REVISION (Right)    Patient reports pain as mild.  Objective:   VITALS:   Vitals:   03/20/18 0002 03/20/18 0801  BP: 115/66 132/69  Pulse: 84 83  Resp: 19 17  Temp: 99.1 F (37.3 C) 98.7 F (37.1 C)  SpO2: 95% 96%    Neurologically intact ABD soft Neurovascular intact Sensation intact distally Intact pulses distally Dorsiflexion/Plantar flexion intact  LABS Recent Labs    03/18/18 0503 03/19/18 0417 03/20/18 0527  HGB 10.6* 10.0* 9.7*  HCT 31.4* 29.0* 29.0*  WBC 9.5 7.8 7.3  PLT 300 243 257    Recent Labs    03/18/18 0503 03/19/18 0417 03/20/18 0527  NA 132* 133* 133*  K 4.6 4.1 4.0  BUN 29* 25* 14  CREATININE 1.34* 1.11 0.99  GLUCOSE 304* 189* 163*    No results for input(s): LABPT, INR in the last 72 hours.   Assessment/Plan: 3 Days Post-Op Procedure(s) (LRB): TOTAL KNEE REVISION (Right)   Continue IV antibiotics. Getting an ultrasound of scrotum due to swelling and itching.

## 2018-03-20 NOTE — Progress Notes (Signed)
Physical Therapy Treatment Patient Details Name: Roger Rodriguez MRN: [id number] DOB: 06/13/1941 Today's Date: 03/20/2018    History of Present Illness 77 y/o male here with R total knee hardwear removal and antibiotic spacer placement 9/5.  He has had a swollen R knee for 5+ months and struggled significantly with function.  H/o Parkinson's, prostate cancer with incontinence issues.     PT Comments    Pt received supine in bed, caregiver in room. Pt agreeable to participate in PT session, heavy focus on basic activation and ROM activities this session, as well as higher intensity resistance exercise on LLE. Pt is aware of weight bearing restrictions.  Pt remain motivated to improve functional status, although he remains heavily limited by bradykinesia and quick onset fatigue with basic exercises, even on the contralateral limb.   Follow Up Recommendations  SNF     Equipment Recommendations       Recommendations for Other Services       Precautions / Restrictions Precautions Precautions: Fall;Knee Restrictions Weight Bearing Restrictions: Yes RLE Weight Bearing: Partial weight bearing RLE Partial Weight Bearing Percentage or Pounds: 30 Other Position/Activity Restrictions: ROM as tolerated w/o overpressure    Mobility  Bed Mobility               General bed mobility comments: deferred this session; OOBO mobility would be best be served by nursing d/t Max+2 Assist needs and weight bearing restrictions.   Transfers                    Ambulation/Gait                 Stairs             Wheelchair Mobility    Modified Rankin (Stroke Patients Only)       Balance                                            Cognition Arousal/Alertness: Awake/alert Behavior During Therapy: WFL for tasks assessed/performed;Flat affect Overall Cognitive Status: Within Functional Limits for tasks assessed                                         Exercises Total Joint Exercises Ankle Circles/Pumps: AROM;Right;15 reps;Supine Short Arc Quad: AAROM;AROM;Left;10 reps;Supine(unable on the Right. Slide/glide feels dysfunctional and limiting) Heel Slides: AAROM;Both;15 reps Hip ABduction/ADduction: AROM;AAROM;Both;15 reps;Supine(AA/ROM Right, A/ROM Left ) Straight Leg Raises: AAROM;Left;10 reps Goniometric ROM: 25-68 degrees Right knee P/ROM  General Exercises - Lower Extremity Mini-Sqauts: Strengthening;Both;10 reps    General Comments        Pertinent Vitals/Pain Pain Assessment: 0-10 Pain Score: 7  Pain Location: Operative Knee  Pain Descriptors / Indicators: Operative site guarding;Sore;Constant;Aching Pain Intervention(s): Limited activity within patient's tolerance;Premedicated before session    Home Living                      Prior Function            PT Goals (current goals can now be found in the care plan section) Progress towards PT goals: PT to reassess next treatment    Frequency    7X/week      PT Plan Current plan remains appropriate    Co-evaluation  AM-PAC PT "6 Clicks" Daily Activity  Outcome Measure  Difficulty turning over in bed (including adjusting bedclothes, sheets and blankets)?: Unable Difficulty moving from lying on back to sitting on the side of the bed? : Unable Difficulty sitting down on and standing up from a chair with arms (e.g., wheelchair, bedside commode, etc,.)?: Unable Help needed moving to and from a bed to chair (including a wheelchair)?: Total Help needed walking in hospital room?: Total Help needed climbing 3-5 steps with a railing? : Total 6 Click Score: 6    End of Session   Activity Tolerance: Patient limited by pain;Patient limited by fatigue Patient left: in bed;with call bell/phone within reach;with nursing/sitter in room;with family/visitor present;with SCD's reapplied Nurse Communication: Other (comment) PT Visit  Diagnosis: Muscle weakness (generalized) (M62.81);Difficulty in walking, not elsewhere classified (R26.2);Pain Pain - Right/Left: Right Pain - part of body: Knee     Time: 3716-9678 PT Time Calculation (min) (ACUTE ONLY): 22 min  Charges:  $Therapeutic Exercise: 8-22 mins                     4:46 PM, 03/20/18 Etta Grandchild, PT, DPT Physical Therapist - [facility]  [phone] ([place])     Buccola,Allan C 03/20/2018, 4:44 PM

## 2018-03-21 DIAGNOSIS — E568 Deficiency of other vitamins: Secondary | ICD-10-CM | POA: Diagnosis not present

## 2018-03-21 DIAGNOSIS — J302 Other seasonal allergic rhinitis: Secondary | ICD-10-CM | POA: Diagnosis not present

## 2018-03-21 DIAGNOSIS — I872 Venous insufficiency (chronic) (peripheral): Secondary | ICD-10-CM | POA: Diagnosis not present

## 2018-03-21 DIAGNOSIS — L89621 Pressure ulcer of left heel, stage 1: Secondary | ICD-10-CM | POA: Diagnosis not present

## 2018-03-21 DIAGNOSIS — M79661 Pain in right lower leg: Secondary | ICD-10-CM | POA: Diagnosis not present

## 2018-03-21 DIAGNOSIS — I10 Essential (primary) hypertension: Secondary | ICD-10-CM | POA: Diagnosis not present

## 2018-03-21 DIAGNOSIS — N451 Epididymitis: Secondary | ICD-10-CM | POA: Diagnosis not present

## 2018-03-21 DIAGNOSIS — M25561 Pain in right knee: Secondary | ICD-10-CM | POA: Diagnosis not present

## 2018-03-21 DIAGNOSIS — R52 Pain, unspecified: Secondary | ICD-10-CM

## 2018-03-21 DIAGNOSIS — M9711XS Periprosthetic fracture around internal prosthetic right knee joint, sequela: Secondary | ICD-10-CM | POA: Diagnosis not present

## 2018-03-21 DIAGNOSIS — L89301 Pressure ulcer of unspecified buttock, stage 1: Secondary | ICD-10-CM | POA: Diagnosis not present

## 2018-03-21 DIAGNOSIS — G2581 Restless legs syndrome: Secondary | ICD-10-CM | POA: Diagnosis not present

## 2018-03-21 DIAGNOSIS — T8453XD Infection and inflammatory reaction due to internal right knee prosthesis, subsequent encounter: Secondary | ICD-10-CM | POA: Diagnosis not present

## 2018-03-21 DIAGNOSIS — L89611 Pressure ulcer of right heel, stage 1: Secondary | ICD-10-CM | POA: Diagnosis not present

## 2018-03-21 DIAGNOSIS — T8189XA Other complications of procedures, not elsewhere classified, initial encounter: Secondary | ICD-10-CM | POA: Diagnosis not present

## 2018-03-21 DIAGNOSIS — K5900 Constipation, unspecified: Secondary | ICD-10-CM | POA: Diagnosis not present

## 2018-03-21 DIAGNOSIS — R262 Difficulty in walking, not elsewhere classified: Secondary | ICD-10-CM | POA: Diagnosis not present

## 2018-03-21 DIAGNOSIS — L97812 Non-pressure chronic ulcer of other part of right lower leg with fat layer exposed: Secondary | ICD-10-CM | POA: Diagnosis not present

## 2018-03-21 DIAGNOSIS — Z01818 Encounter for other preprocedural examination: Secondary | ICD-10-CM | POA: Diagnosis not present

## 2018-03-21 DIAGNOSIS — T8453XA Infection and inflammatory reaction due to internal right knee prosthesis, initial encounter: Secondary | ICD-10-CM | POA: Diagnosis not present

## 2018-03-21 DIAGNOSIS — R1312 Dysphagia, oropharyngeal phase: Secondary | ICD-10-CM | POA: Diagnosis not present

## 2018-03-21 DIAGNOSIS — L304 Erythema intertrigo: Secondary | ICD-10-CM | POA: Diagnosis not present

## 2018-03-21 DIAGNOSIS — T8459XA Infection and inflammatory reaction due to other internal joint prosthesis, initial encounter: Secondary | ICD-10-CM | POA: Diagnosis not present

## 2018-03-21 DIAGNOSIS — G20 Parkinson's disease: Secondary | ICD-10-CM | POA: Diagnosis not present

## 2018-03-21 DIAGNOSIS — D649 Anemia, unspecified: Secondary | ICD-10-CM | POA: Diagnosis not present

## 2018-03-21 DIAGNOSIS — R471 Dysarthria and anarthria: Secondary | ICD-10-CM | POA: Diagnosis not present

## 2018-03-21 DIAGNOSIS — N3281 Overactive bladder: Secondary | ICD-10-CM | POA: Diagnosis not present

## 2018-03-21 DIAGNOSIS — Z7401 Bed confinement status: Secondary | ICD-10-CM | POA: Diagnosis not present

## 2018-03-21 DIAGNOSIS — M6281 Muscle weakness (generalized): Secondary | ICD-10-CM | POA: Diagnosis not present

## 2018-03-21 DIAGNOSIS — Z8546 Personal history of malignant neoplasm of prostate: Secondary | ICD-10-CM | POA: Diagnosis not present

## 2018-03-21 DIAGNOSIS — J309 Allergic rhinitis, unspecified: Secondary | ICD-10-CM | POA: Diagnosis not present

## 2018-03-21 DIAGNOSIS — I959 Hypotension, unspecified: Secondary | ICD-10-CM | POA: Diagnosis not present

## 2018-03-21 DIAGNOSIS — N179 Acute kidney failure, unspecified: Secondary | ICD-10-CM | POA: Diagnosis not present

## 2018-03-21 DIAGNOSIS — E785 Hyperlipidemia, unspecified: Secondary | ICD-10-CM | POA: Diagnosis not present

## 2018-03-21 MED ORDER — CEFTRIAXONE IV (FOR PTA / DISCHARGE USE ONLY): 2.0000 g | INTRAVENOUS | 0 refills | Status: AC

## 2018-03-21 MED ORDER — ASPIRIN 81 MG PO CHEW: 81.0000 mg | CHEWABLE_TABLET | Freq: Two times a day (BID) | ORAL | 0 refills | Status: DC

## 2018-03-21 MED ORDER — DOCUSATE SODIUM 100 MG PO CAPS: 100.0000 mg | ORAL_CAPSULE | Freq: Two times a day (BID) | ORAL | 0 refills | Status: DC

## 2018-03-21 MED ORDER — SODIUM CHLORIDE 0.9 % IV SOLN
2.0000 g | Freq: Every day | INTRAVENOUS | Status: DC
  Administered 2018-03-21: 2 g via INTRAVENOUS
  Filled 2018-03-21: qty 2
  Filled 2018-03-21: qty 20

## 2018-03-21 MED ORDER — NYSTATIN 100000 UNIT/GM EX POWD: Freq: Three times a day (TID) | CUTANEOUS | 0 refills | Status: DC

## 2018-03-21 MED ORDER — SODIUM CHLORIDE 0.9 % IV SOLN: 2.0000 g | Freq: Every day | INTRAVENOUS | 0 refills | Status: DC

## 2018-03-21 MED ORDER — HYDROCODONE-ACETAMINOPHEN 5-325 MG PO TABS: 1.0000 | ORAL_TABLET | ORAL | 0 refills | Status: DC | PRN

## 2018-03-21 NOTE — Discharge Instructions (Signed)
Continue partial weight bearing lower extremity.    Knee immobilizer when ambulating.  Elevate lower extremity whenever possible and continue the polar care while elevating the extremity. Patient may shower. No bath or submerging the wound.    VAC change every M, W, F  Take aspirin as directed for blood clot prevention.  Continue to use a walker for assistance with ambulation.  Call [phone] with any questions, such as fever > 101.5 degrees, drainage from the wound or shortness of breath.  Return to clinic in 12 to 16 days for suture removal

## 2018-03-21 NOTE — Consult Note (Addendum)
[place] Nurse wound consult note Reason for Consult: NPWT dressing change  Wound type: surgical  Pressure Injury POA: NA Measurement: 2.5 cm x 2.5cm x 1.5cm x 5cm tunnel at 12 o'clock  Wound TMM:ITVIF, moist Drainage (amount, consistency, odor) scant in VAC cansiter Periwound: intact  Dressing procedure/placement/frequency: Removed old NPWT dressing Cleansed wound with normal saline Periwound skin protected,window framed with drape Filled wound with 1 piece of black foam. Sealed NPWT dressing at 179mm HG/113mmHG  Patient received PO pain medication per bedside nurse prior to dressing change Patient tolerated procedure well   [place] nurse will continue to provide NPWT dressing changed due to the complexity of the dressing change.  [place], [place], [place]

## 2018-03-21 NOTE — Progress Notes (Signed)
Pt ready for discharge to SNF per MD. Pt assessment unchanged from this morning. Report called to [place] [facility]; all questions answered and discharge instructions reviewed. EMS transportation set up for pt by RN. PICC line dressing changed d/t drainage and flushed, VSS. Pt belongings packed. Caregiver, [name], at bedside. Home med Ryt[place] picked up from pharmacy.  Ethelda Chick

## 2018-03-21 NOTE — Telephone Encounter (Signed)
Roger Rodriguez did you make this appointment yet?

## 2018-03-21 NOTE — Clinical Social Work Placement (Signed)
   CLINICAL SOCIAL WORK PLACEMENT  NOTE  Date:  03/21/2018  Patient Details  Name: Roger Rodriguez MRN: [id number] Date of Birth: February 03, 1941  Clinical Social Work is seeking post-discharge placement for this patient at the [place] level of care (*CSW will initial, date and re-position this form in  chart as items are completed):  Yes   Patient/family provided with [place] Work Department's list of facilities offering this level of care within the geographic area requested by the patient (or if unable, by the patient's family).  Yes   Patient/family informed of their freedom to choose among providers that offer the needed level of care, that participate in Medicare, Medicaid or managed care program needed by the patient, have an available bed and are willing to accept the patient.  Yes   Patient/family informed of [place]'s ownership interest in [facility] and [facility], as well as of the fact that they are under no obligation to receive care at these facilities.  PASRR submitted to EDS on       PASRR number received on       Existing PASRR number confirmed on 03/18/18     FL2 transmitted to all facilities in geographic area requested by pt/family on 03/18/18     FL2 transmitted to all facilities within larger geographic area on       Patient informed that his/her managed care company has contracts with or will negotiate with certain facilities, including the following:        Yes   Patient/family informed of bed offers received.  Patient chooses bed at ([facility] )     Physician recommends and patient chooses bed at      Patient to be transferred to   on  .  Patient to be transferred to facility by       Patient family notified on   of transfer.  Name of family member notified:        PHYSICIAN       Additional Comment:    _______________________________________________ Mimi Debellis, Veronia Beets, LCSW 03/21/2018, 9:53 AM

## 2018-03-21 NOTE — Clinical Social Work Placement (Signed)
   CLINICAL SOCIAL WORK PLACEMENT  NOTE  Date:  03/21/2018  Patient Details  Name: Roger Rodriguez MRN: [id number] Date of Birth: Apr 10, 1941  Clinical Social Work is seeking post-discharge placement for this patient at the [place] level of care (*CSW will initial, date and re-position this form in  chart as items are completed):  Yes   Patient/family provided with [place] Work Department's list of facilities offering this level of care within the geographic area requested by the patient (or if unable, by the patient's family).  Yes   Patient/family informed of their freedom to choose among providers that offer the needed level of care, that participate in Medicare, Medicaid or managed care program needed by the patient, have an available bed and are willing to accept the patient.  Yes   Patient/family informed of [place]'s ownership interest in [facility] and [facility], as well as of the fact that they are under no obligation to receive care at these facilities.  PASRR submitted to EDS on       PASRR number received on       Existing PASRR number confirmed on 03/18/18     FL2 transmitted to all facilities in geographic area requested by pt/family on 03/18/18     FL2 transmitted to all facilities within larger geographic area on       Patient informed that his/her managed care company has contracts with or will negotiate with certain facilities, including the following:        Yes   Patient/family informed of bed offers received.  Patient chooses bed at ([facility] )     Physician recommends and patient chooses bed at      Patient to be transferred to ([facility] ) on 03/21/18.  Patient to be transferred to facility by [facility] )     Patient family notified on 03/21/18 of transfer.  Name of family member notified:  ([place] left patient's son Erline Levine a Advertising account executive. )     PHYSICIAN       Additional Comment:     _______________________________________________ Ardit Danh, Veronia Beets, LCSW 03/21/2018, 1:49 PM

## 2018-03-21 NOTE — Progress Notes (Signed)
Physical Therapy Treatment Patient Details Name: Roger Rodriguez MRN: [id number] DOB: [birth year] Today's Date: 03/21/2018    History of Present Illness 77 y/o male here with R total knee hardwear removal and antibiotic spacer placement 9/5.  He has had a swollen R knee for 5+ months and struggled significantly with function.  H/o Parkinson's, prostate cancer with incontinence issues.     PT Comments    Pt agreeable to PT; reports 5/10 pain RLE/knee and pain also in L with weight bearing. Pt notes just returning to bed from [facility] use. 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 Educated pt on appropriate weight bearing verbally. Pt agreeable to exercises. Participates well with BLE exercises; caution used with RLE to avoid any overpressure with knee range as well as any stress to L knee during resisted hip abduction. At the time of this document, pt has current discharge to skilled nursing facility to continue rehab efforts.   Follow Up Recommendations  SNF     Equipment Recommendations       Recommendations for Other Services       Precautions / Restrictions Precautions Precautions: Fall;Knee Restrictions Weight Bearing Restrictions: Yes RLE Weight Bearing: Partial weight bearing(Pt has difficulty weightbearing on L due to severe arthritis) RLE Partial Weight Bearing Percentage or Pounds: 30 Other Position/Activity Restrictions: ROM as tolerated w/o overpressure    Mobility  Bed Mobility               General bed mobility comments: Not tested, as pt just returned to bed from up on [facility]  Transfers                    Ambulation/Gait                 Stairs             Wheelchair Mobility    Modified Rankin (Stroke Patients Only)       Balance                                             Cognition Arousal/Alertness: Awake/alert Behavior During Therapy: WFL for tasks assessed/performed;Flat affect Overall Cognitive Status: Within Functional Limits for tasks assessed                                        Exercises General Exercises - Lower Extremity Ankle Circles/Pumps: AROM;Both;20 reps;Supine Quad Sets: Strengthening;Both;20 reps;Supine Gluteal Sets: Strengthening;Both;20 reps;Supine Short Arc Quad: AROM;Left;20 reps;Supine Heel Slides: AAROM;Both;20 reps;Supine Hip ABduction/ADduction: AAROM;Right;20 reps;Supine(light resist abd L; no varus stress to knee) Straight Leg Raises: AAROM;Left;10 reps;Supine(2 sets)    General Comments        Pertinent Vitals/Pain Pain Assessment: 0-10 Pain Score: 5  Pain Location: R knee(L knee also hurts with weight bearing ) Pain Descriptors / Indicators: Constant;Aching Pain Intervention(s): Limited activity within patient's tolerance;Premedicated before session;Monitored during session    Home Living                      Prior Function            PT Goals (current goals  can now be found in the care plan section) Progress towards PT goals: Progressing toward goals    Frequency    7X/week      PT Plan Current plan remains appropriate    Co-evaluation              AM-PAC PT "6 Clicks" Daily Activity  Outcome Measure  Difficulty turning over in bed (including adjusting bedclothes, sheets and blankets)?: Unable Difficulty moving from lying on back to sitting on the side of the bed? : Unable Difficulty sitting down on and standing up from a chair with arms (e.g., wheelchair, bedside commode, etc,.)?: Unable Help needed moving to and from a bed to chair (including a wheelchair)?: A Lot Help needed walking in hospital room?: Total Help needed climbing 3-5 steps with a railing? : Total 6 Click Score: 7    End of Session   Activity Tolerance: Patient tolerated treatment well;Patient  limited by fatigue Patient left: in bed;with call bell/phone within reach;with bed alarm set;with family/visitor present;Other (comment)(ankle rolls; R pillow lateral to prevent ER)   PT Visit Diagnosis: Muscle weakness (generalized) (M62.81);Difficulty in walking, not elsewhere classified (R26.2);Pain Pain - Right/Left: Right Pain - part of body: Knee     Time: 1134-1203 PT Time Calculation (min) (ACUTE ONLY): 29 min  Charges:  $Therapeutic Exercise: 23-37 mins                      Larae Grooms, PTA 03/21/2018, 12:49 PM

## 2018-03-21 NOTE — Telephone Encounter (Signed)
Pt stated that he noticed he missed a call from our office trying to schedule a surgery clearance appt. Pt stated that he had the surgery done last week and is currently still in the hospital from having the surgery. Thanks TNP

## 2018-03-21 NOTE — Progress Notes (Signed)
Patient is medically stable for D/C to [facility] today. Per Otila Klu[facility] liaison patient can come today to room 605. RN will call report and arrange EMS for transport. Clinical Education officer, museum (CSW) sent D/C orders to [facility] via [facility]. Patient is aware of above. CSW left patient's son Erline Levine a Advertising account executive. Please reconsult if future social work needs arise. CSW signing off.   [employer], LCSW [phone]

## 2018-03-21 NOTE — Progress Notes (Signed)
[place] [facility] [place] NAME: Roger Rodriguez    MR#:  [id number]  DATE OF BIRTH:  November 01, 1940  SUBJECTIVE:  CHIEF COMPLAINT: Patient with is improving scrotal burning and pain  No other complaints.  PICC line placed Patient's caregiver Ms. [name] at bedside  REVIEW OF SYSTEMS:  CONSTITUTIONAL: No fever, fatigue or weakness.  EYES: No blurred or double vision.  EARS, NOSE, AND THROAT: No tinnitus or ear pain.  RESPIRATORY: No cough, shortness of breath, wheezing or hemoptysis.  CARDIOVASCULAR: No chest pain, orthopnea, edema.  GASTROINTESTINAL: No nausea, vomiting, diarrhea or abdominal pain.  GENITOURINARY: No dysuria, hematuria.  ENDOCRINE: No polyuria, nocturia,  HEMATOLOGY: No anemia, easy bruising or bleeding SKIN: No rash or lesion. MUSCULOSKELETAL: No joint pain  NEUROLOGIC: No tingling, numbness, weakness.  PSYCHIATRY: No anxiety or depression.   DRUG ALLERGIES:  No Known Allergies  VITALS:  Blood pressure 131/62, pulse 79, temperature 98.2 F (36.8 C), temperature source Oral, resp. rate 18, height 5\' 11"  (1.803 m), weight 95.7 kg, SpO2 98 %.  PHYSICAL EXAMINATION:  GENERAL:  77 y.o.-year-old patient lying in the bed with no acute distress.  EYES: Pupils equal, round, reactive to light and accommodation. No scleral icterus. Extraocular muscles intact.  HEENT: Head atraumatic, normocephalic. Oropharynx and nasopharynx clear.  NECK:  Supple, no jugular venous distention. No thyroid enlargement, no tenderness.  LUNGS: Normal breath sounds bilaterally, no wheezing, rales,rhonchi or crepitation. No use of accessory muscles of respiration.  CARDIOVASCULAR: S1, S2 normal. No murmurs, rubs, or gallops.  ABDOMEN: Soft, nontender, nondistended. Bowel sounds present.  UROLOGY:scrotal erythema and min tenderness and some edema EXTREMITIES: No pedal edema, cyanosis, or clubbing.  NEUROLOGIC: Cranial nerves II through XII are intact.   Right knee in clean dressing ,sensation intact. Gait not checked.  PSYCHIATRIC: The patient is alert and oriented x 3.  SKIN: No obvious rash, lesion, or ulcer.    LABORATORY PANEL:   CBC Recent Labs  Lab 03/20/18 0527  WBC 7.3  HGB 9.7*  HCT 29.0*  PLT 257   ------------------------------------------------------------------------------------------------------------------  Chemistries  Recent Labs  Lab 03/20/18 0527  NA 133*  K 4.0  CL 98  CO2 28  GLUCOSE 163*  BUN 14  CREATININE 0.99  CALCIUM 8.1*   ------------------------------------------------------------------------------------------------------------------  Cardiac Enzymes Recent Labs  Lab 03/18/18 0957  TROPONINI <0.03   ------------------------------------------------------------------------------------------------------------------  RADIOLOGY:  US Scrotum  Result Date: 03/20/2018 CLINICAL DATA:  Testicular pain and swelling for 3 days. EXAM: SCROTAL ULTRASOUND DOPPLER ULTRASOUND OF THE TESTICLES TECHNIQUE: Complete ultrasound examination of the testicles, epididymis, and other scrotal structures was performed. Color and spectral Doppler ultrasound were also utilized to evaluate blood flow to the testicles. COMPARISON:  None. FINDINGS: Right testicle Measurements: 4.1 x 2.2 x 3.0 cm. No mass or microlithiasis visualized. Left testicle Measurements: 3.9 x 2.1 x 2.9 cm. No mass or microlithiasis visualized. Right epididymis: 2 subcentimeter cysts, the largest being septated and measuring 3 x 4 x 5 mm. Enlarged, complex appearance of the epididymal body with increased vascularity. Left epididymis: 3 mm cyst. Enlarged, complex appearance of the epididymal body with increased vascularity. Hydrocele:  Small bilateral hydroceles. Varicocele: Borderline left varicocele with vessels measuring up to 3 mm. Pulsed Doppler interrogation of both testes demonstrates normal low resistance arterial and venous waveforms bilaterally.  There is diffuse scrotal wall edema and increased vascularity. IMPRESSION: Edema and increased vascularity throughout the scrotal wall and epididymis bilaterally which may reflect epididymitis and cellulitis.  No organized abscess. Electronically Signed   By: Logan Bores M.D.   On: 03/20/2018 13:21   US Scrotum Doppler  Result Date: 03/20/2018 CLINICAL DATA:  Testicular pain and swelling for 3 days. EXAM: SCROTAL ULTRASOUND DOPPLER ULTRASOUND OF THE TESTICLES TECHNIQUE: Complete ultrasound examination of the testicles, epididymis, and other scrotal structures was performed. Color and spectral Doppler ultrasound were also utilized to evaluate blood flow to the testicles. COMPARISON:  None. FINDINGS: Right testicle Measurements: 4.1 x 2.2 x 3.0 cm. No mass or microlithiasis visualized. Left testicle Measurements: 3.9 x 2.1 x 2.9 cm. No mass or microlithiasis visualized. Right epididymis: 2 subcentimeter cysts, the largest being septated and measuring 3 x 4 x 5 mm. Enlarged, complex appearance of the epididymal body with increased vascularity. Left epididymis: 3 mm cyst. Enlarged, complex appearance of the epididymal body with increased vascularity. Hydrocele:  Small bilateral hydroceles. Varicocele: Borderline left varicocele with vessels measuring up to 3 mm. Pulsed Doppler interrogation of both testes demonstrates normal low resistance arterial and venous waveforms bilaterally. There is diffuse scrotal wall edema and increased vascularity. IMPRESSION: Edema and increased vascularity throughout the scrotal wall and epididymis bilaterally which may reflect epididymitis and cellulitis. No organized abscess. Electronically Signed   By: Logan Bores M.D.   On: 03/20/2018 13:21    EKG:   Orders placed or performed during the hospital encounter of 10/12/17  . ED EKG  . ED EKG    ASSESSMENT AND PLAN:    Hypotension-  due to volume depletion. 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 Management plans discussed with the patient, family and they are in agreement.  CODE STATUS: FC   TOTAL TIME TAKING CARE OF THIS PATIENT: 33  minutes.    Note: This dictation was prepared with Dragon dictation along with smaller phrase technology. Any transcriptional errors that result from this process are unintentional.   Nicholes Mango M.D on 03/21/2018 at 2:29 PM  Between 7am to 6pm - Pager - [phone] After 6pm go to [email or web address] - password [email or web address] [facility]  [facility]  Office  [phone]  CC: Primary care physician; [name]., MD

## 2018-03-21 NOTE — Progress Notes (Signed)
Subjective 77 yo male sp Right total knee revision for prosthetic joint infection POD 4 Pain is well controlled. Discharge to SNF today. Patient is eager to finish rehab and go home  Objective NAD Alert and oriented Dressing: CDI no signs of active bleeding. ACE wrap gauze pads in place Neurvascular grossly intact. Ankle plantarflexion/dorsiflexion intact. Palpable pedal pulses and intact distal sensation right lower extremity.    Assessment 77 yo male sp Right total knee revision for prosthetic joint infection POD 4 recovering well  Plan Continue PT as directed Discharge to SNF

## 2018-03-21 NOTE — Care Management Important Message (Signed)
Important Message  Patient Details  Name: Roger Rodriguez MRN: [id number] Date of Birth: 10-20-1940   Medicare Important Message Given:  Yes    [name] A [name] 03/21/2018, 11:07 AM

## 2018-03-21 NOTE — Progress Notes (Signed)
PHARMACY CONSULT NOTE FOR:  OUTPATIENT  PARENTERAL ANTIBIOTIC THERAPY (OPAT)  Indication: Prosthetic joint infection (Knee) with P. mirabilis Regimen: Ceftriaxone 2gm IV q24h End date: 04/29/2018  IV antibiotic discharge orders are ordered (modified existing orders that were entered without stop date or OPAT format).     Thank you for allowing pharmacy to be a part of this patient's care.  Doreene Eland, PharmD, BCPS.   Work Cell: [phone] 03/21/2018 12:25 PM

## 2018-03-21 NOTE — Progress Notes (Signed)
Urology Consult Follow Up  Subjective: Denies any significant scrotal pain.  Notes he believes his scrotal exam has improved.  Complains of urinary incontinence.  Anti-infectives: Anti-infectives (From admission, onward)   Start     Dose/Rate Route Frequency Ordered Stop   03/17/18 1815  piperacillin-tazobactam (ZOSYN) IVPB 3.375 g     3.375 g 12.5 mL/hr over 240 Minutes Intravenous Every 8 hours 03/17/18 1756     03/17/18 1800  vancomycin (VANCOCIN) IVPB 1000 mg/200 mL premix     1,000 mg 200 mL/hr over 60 Minutes Intravenous Every 12 hours 03/17/18 1756 03/17/18 2104   03/17/18 1419  50,000 units bacitracin in 0.9% normal saline 250 mL irrigation  Status:  Discontinued       As needed 03/17/18 1419 03/17/18 1627      Current Facility-Administered Medications  Medication Dose Route Frequency Provider Last Rate Last Dose  . 0.9 %  sodium chloride infusion   Intravenous Continuous Mayo, Pete Pelt, MD 100 mL/hr at 03/20/18 1932    . acetaminophen (TYLENOL) tablet 325-650 mg  325-650 mg Oral Q6H PRN Lovell Sheehan, MD      . aspirin chewable tablet 81 mg  81 mg Oral BID Lovell Sheehan, MD   81 mg at 03/21/18 0805  . bisacodyl (DULCOLAX) suppository 10 mg  10 mg Rectal Daily PRN Lovell Sheehan, MD   10 mg at 03/20/18 6387  . Carbidopa-Levodopa ER 23.75-95 MG CPCR 3 capsule  3 capsule Oral QID Lovell Sheehan, MD   3 capsule at 03/21/18 0803  . cholecalciferol (VITAMIN D) tablet 1,000 Units  1,000 Units Oral Daily Lovell Sheehan, MD   1,000 Units at 03/21/18 0804  . docusate sodium (COLACE) capsule 100 mg  100 mg Oral BID Lovell Sheehan, MD   100 mg at 03/21/18 0805  . fluticasone (FLONASE) 50 MCG/ACT nasal spray 1 spray  1 spray Each Nare Q12H PRN Lovell Sheehan, MD      . gabapentin (NEURONTIN) capsule 400 mg  400 mg Oral TID Lovell Sheehan, MD   400 mg at 03/21/18 0805  . HYDROcodone-acetaminophen (NORCO) 7.5-325 MG per tablet 1-2 tablet  1-2 tablet Oral Q4H PRN Lovell Sheehan, MD       . HYDROcodone-acetaminophen (NORCO/VICODIN) 5-325 MG per tablet 1-2 tablet  1-2 tablet Oral Q4H PRN Lovell Sheehan, MD   1 tablet at 03/21/18 0804  . lactated ringers infusion   Intravenous Continuous Lovell Sheehan, MD 75 mL/hr at 03/18/18 1208    . lisinopril (PRINIVIL,ZESTRIL) tablet 2.5 mg  2.5 mg Oral Daily Gouru, Aruna, MD   2.5 mg at 03/21/18 0805  . loratadine (CLARITIN) tablet 10 mg  10 mg Oral Daily Lovell Sheehan, MD   10 mg at 03/21/18 0804  . magnesium citrate solution 1 Bottle  1 Bottle Oral Once PRN Lovell Sheehan, MD      . magnesium hydroxide (MILK OF MAGNESIA) suspension 30 mL  30 mL Oral Daily PRN Lovell Sheehan, MD      . menthol-cetylpyridinium (CEPACOL) lozenge 3 mg  1 lozenge Oral PRN Lovell Sheehan, MD       Or  . phenol (CHLORASEPTIC) mouth spray 1 spray  1 spray Mouth/Throat PRN Lovell Sheehan, MD      . metoCLOPramide (REGLAN) tablet 5-10 mg  5-10 mg Oral Q8H PRN Lovell Sheehan, MD       Or  . metoCLOPramide (REGLAN) injection 5-10  mg  5-10 mg Intravenous Q8H PRN Lovell Sheehan, MD      . mirabegron ER [facility]) tablet 25 mg  25 mg Oral BID Lovell Sheehan, MD   25 mg at 03/21/18 0804  . morphine 2 MG/ML injection 0.5-1 mg  0.5-1 mg Intravenous Q2H PRN Lovell Sheehan, MD      . nystatin (MYCOSTATIN/NYSTOP) topical powder   Topical TID Nicholes Mango, MD      . ondansetron (ZOFRAN) tablet 4 mg  4 mg Oral Q6H PRN Lovell Sheehan, MD       Or  . ondansetron [facility]) injection 4 mg  4 mg Intravenous Q6H PRN Lovell Sheehan, MD      . piperacillin-tazobactam (ZOSYN) IVPB 3.375 g  3.375 g Intravenous Q8H Lovell Sheehan, MD 12.5 mL/hr at 03/21/18 0358 3.375 g at 03/21/18 0358  . polyethylene glycol (MIRALAX / GLYCOLAX) packet 17 g  17 g Oral Daily PRN Gouru, Aruna, MD      . polyvinyl alcohol (LIQUIFILM TEARS) 1.4 % ophthalmic solution 1 drop  1 drop Both Eyes PRN Lovell Sheehan, MD      . pramipexole (MIRAPEX) tablet 0.25 mg  0.25 mg Oral BID Lovell Sheehan, MD   0.25 mg at 03/21/18 0804  . pramipexole (MIRAPEX) tablet 0.5 mg  0.5 mg Oral QHS Napoleon Form, RPH   0.5 mg at 03/20/18 2124  . vitamin B-12 (CYANOCOBALAMIN) tablet 1,000 mcg  1,000 mcg Oral Daily Lovell Sheehan, MD   1,000 mcg at 03/21/18 0804  . vitamin C (ASCORBIC ACID) tablet 500 mg  500 mg Oral Daily Lovell Sheehan, MD   500 mg at 03/21/18 0804  . vitamin E capsule 400 Units  400 Units Oral Daily Lovell Sheehan, MD   400 Units at 03/21/18 0805     Objective: Vital signs in last 24 hours: Temp:  [98.2 F (36.8 C)-99.5 F (37.5 C)] 98.2 F (36.8 C) (09/09 0750) Pulse Rate:  [75-94] 80 (09/09 0750) Resp:  [16-18] 18 (09/09 0750) BP: (109-131)/(59-66) 131/62 (09/09 0750) SpO2:  [95 %-98 %] 98 % (09/09 0750)  Intake/Output from previous day: 09/08 0701 - 09/09 0700 In: 2979.6 [I.V.:2807.9; IV Piggyback:171.7] Out: 1050 [Urine:800; Drains:250] Intake/Output this shift: No intake/output data recorded.   Physical Exam  No acute distress.  Alert and oriented No respiratory distress Abdomen soft Mildly erythematous scrotum without significant scrotal skin changes, bilateral enlarged epididymes with normal testicles.  Minimally tender.  Perineum intact.  Mild edema of the penile foreskin, no ongoing paraphimosis.  Patent urethral meatus.  Urinal in between legs, clear yellow urine present. Significant right knee swelling, undergoing dressing changes with nurse currently.  Lab Results:  Recent Labs    03/19/18 0417 03/20/18 0527  WBC 7.8 7.3  HGB 10.0* 9.7*  HCT 29.0* 29.0*  PLT 243 257   BMET Recent Labs    03/19/18 0417 03/20/18 0527  NA 133* 133*  K 4.1 4.0  CL 101 98  CO2 26 28  GLUCOSE 189* 163*  BUN 25* 14  CREATININE 1.11 0.99  CALCIUM 7.9* 8.1*   Studies/Results: US Scrotum  Result Date: 03/20/2018 CLINICAL DATA:  Testicular pain and swelling for 3 days. EXAM: SCROTAL ULTRASOUND DOPPLER ULTRASOUND OF THE TESTICLES TECHNIQUE: Complete  ultrasound examination of the testicles, epididymis, and other scrotal structures was performed. Color and spectral Doppler ultrasound were also utilized to evaluate blood flow to the testicles. COMPARISON:  None. FINDINGS:  Right testicle Measurements: 4.1 x 2.2 x 3.0 cm. No mass or microlithiasis visualized. Left testicle Measurements: 3.9 x 2.1 x 2.9 cm. No mass or microlithiasis visualized. Right epididymis: 2 subcentimeter cysts, the largest being septated and measuring 3 x 4 x 5 mm. Enlarged, complex appearance of the epididymal body with increased vascularity. Left epididymis: 3 mm cyst. Enlarged, complex appearance of the epididymal body with increased vascularity. Hydrocele:  Small bilateral hydroceles. Varicocele: Borderline left varicocele with vessels measuring up to 3 mm. Pulsed Doppler interrogation of both testes demonstrates normal low resistance arterial and venous waveforms bilaterally. There is diffuse scrotal wall edema and increased vascularity. IMPRESSION: Edema and increased vascularity throughout the scrotal wall and epididymis bilaterally which may reflect epididymitis and cellulitis. No organized abscess. Electronically Signed   By: Logan Bores M.D.   On: 03/20/2018 13:21   US Scrotum Doppler  Result Date: 03/20/2018 CLINICAL DATA:  Testicular pain and swelling for 3 days. EXAM: SCROTAL ULTRASOUND DOPPLER ULTRASOUND OF THE TESTICLES TECHNIQUE: Complete ultrasound examination of the testicles, epididymis, and other scrotal structures was performed. Color and spectral Doppler ultrasound were also utilized to evaluate blood flow to the testicles. COMPARISON:  None. FINDINGS: Right testicle Measurements: 4.1 x 2.2 x 3.0 cm. No mass or microlithiasis visualized. Left testicle Measurements: 3.9 x 2.1 x 2.9 cm. No mass or microlithiasis visualized. Right epididymis: 2 subcentimeter cysts, the largest being septated and measuring 3 x 4 x 5 mm. Enlarged, complex appearance of the epididymal body  with increased vascularity. Left epididymis: 3 mm cyst. Enlarged, complex appearance of the epididymal body with increased vascularity. Hydrocele:  Small bilateral hydroceles. Varicocele: Borderline left varicocele with vessels measuring up to 3 mm. Pulsed Doppler interrogation of both testes demonstrates normal low resistance arterial and venous waveforms bilaterally. There is diffuse scrotal wall edema and increased vascularity. IMPRESSION: Edema and increased vascularity throughout the scrotal wall and epididymis bilaterally which may reflect epididymitis and cellulitis. No organized abscess. Electronically Signed   By: Logan Bores M.D.   On: 03/20/2018 13:21     Assessment: 77 year old male with a history of prostate cancer, urinary incontinence admitted with right infected knee now found to have bilateral epididymitis.  Currently on Zosyn.  Clinically improving.  Plan: -Continue Zosyn, follow-up urine culture and adjust as needed -Continue nystatin powder for concomitant candidal skin infection -Scrotal support (rollup washcloth underneath scrotum while seated, tight underwear when ambulating)  Urology will sign off, improved today.  Please call us if his clinical exam worsens.    LOS: 4 days    Hollice Espy 03/21/2018

## 2018-03-21 NOTE — Treatment Plan (Signed)
Diagnosis: Proteus mirabilis prosthetic joint infection Baseline Creatinine  0.99 on 03/20/18  Culture Result: Proteus mirabilis No Known Allergies  OPAT Orders Discharge antibiotics: Ceftriaxone 2 grams IV every 24 hrs Duration:6 weeks  End Date: 04/29/18   [facility] Care Per Protocol:  Labs weekly while on IV antibiotics: __X CBC with differential X__ CMP _X_ ESR   _X_ Please pull PIC at completion of IV antibiotics   Call Dr.Rubye Strohmeyer with any critical value or questions [phone]  Scan and send results to [email or web address]

## 2018-03-21 NOTE — Anesthesia Postprocedure Evaluation (Signed)
Anesthesia Post Note  Patient: Roger Rodriguez  Procedure(s) Performed: TOTAL KNEE REVISION (Right Knee)  Patient location during evaluation: PACU Anesthesia Type: General Level of consciousness: awake and alert Pain management: pain level controlled Vital Signs Assessment: post-procedure vital signs reviewed and stable Respiratory status: spontaneous breathing, nonlabored ventilation, respiratory function stable and patient connected to nasal cannula oxygen Cardiovascular status: blood pressure returned to baseline and stable Postop Assessment: no apparent nausea or vomiting Anesthetic complications: no     Last Vitals:  Vitals:   03/20/18 2342 03/21/18 0750  BP: 109/66 131/62  Pulse: 75 80  Resp: 16 18  Temp: 36.9 C 36.8 C  SpO2: 95% 98%    Last Pain:  Vitals:   03/21/18 0904  TempSrc:   PainSc: [place]

## 2018-03-21 NOTE — Discharge Summary (Addendum)
Physician Discharge Summary  Patient ID: Roger Rodriguez MRN: [id number] DOB/AGE: 1941-05-28 77 y.o.  Admit date: 03/17/2018 Discharge date: 03/21/2018  Admission Diagnoses:  PROSTHETIC JOINT INFECTION Infection of prosthetic knee joint [facility])  Discharge Diagnoses:  PROSTHETIC JOINT INFECTION Principal Problem:   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[place])   Past Medical History:  Diagnosis Date  . Allergy    allergic rhinitis  . Arthritis    OA  . Chronic kidney disease    stones  . Depression   . Family history of adverse reaction to anesthesia   . Hematuria   . History of kidney stones   . Hyperlipidemia   . Hypertension    no meds since parkinsons meds started  . Incontinence of urine   . Inguinal hernia   . Neuromuscular disorder ([place])    parkinsons  . Overactive bladder   . Parkinson's disease ([place])   . Prostate cancer ([place])   . Rhinitis, allergic   . Shortness of breath dyspnea    doe secondary to parkinsons    Surgeries: Procedure(s): TOTAL KNEE REVISION on 03/17/2018   Consultants (if any): Treatment Team:  Tsosie Billing, MD [name], MD Lovell Sheehan, MD Lucas Mallow, MD  Discharged Condition: Improved  Hospital Course: Roger Rodriguez is an 77 y.o. 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    He was given perioperative antibiotics:  Anti-infectives (From admission, onward)   Start     Dose/Rate Route Frequency Ordered Stop   03/21/18 1100  cefTRIAXone (ROCEPHIN) 2 g in sodium chloride 0.9 % 100 mL IVPB     2 g 200 mL/hr over 30 Minutes Intravenous Daily 03/21/18 1045     03/21/18 0000  cefTRIAXone 2 g in sodium chloride 0.9 % 100 mL     2 g 200 mL/hr over 30 Minutes Intravenous Daily 03/21/18 1144     03/17/18 1815  piperacillin-tazobactam (ZOSYN) IVPB 3.375 g  Status:   Discontinued     3.375 g 12.5 mL/hr over 240 Minutes Intravenous Every 8 hours 03/17/18 1756 03/21/18 1045   03/17/18 1800  vancomycin (VANCOCIN) IVPB 1000 mg/200 mL premix     1,000 mg 200 mL/hr over 60 Minutes Intravenous Every 12 hours 03/17/18 1756 03/17/18 2104   03/17/18 1419  50,000 units bacitracin in 0.9% normal saline 250 mL irrigation  Status:  Discontinued       As needed 03/17/18 1419 03/17/18 1627    .  He was given sequential compression devices, early ambulation, and aspirin for DVT prophylaxis. Urology was consulted for scrotal swelling and redness. Infectious disease was consulted for antibiotic selection and duration. A PIC line was placed. Wound nurse was consulted for [facility] change Monday, Wednesday and Friday.  He benefited maximally from the hospital stay and there were no complications.    Recent vital signs:  Vitals:   03/20/18 2342 03/21/18 0750  BP: 109/66 131/62  Pulse: 75 80  Resp: 16 18  Temp: 98.4 F (36.9 C) 98.2 F (36.8 C)  SpO2: 95% 98%    Recent laboratory studies:  Lab Results  Component Value Date   HGB 9.7 (L) 03/20/2018   HGB 10.0 (L) 03/19/2018   HGB 10.6 (L) 03/18/2018   Lab Results  Component Value Date   WBC 7.3 03/20/2018   PLT 257 03/20/2018  Lab Results  Component Value Date   INR 1.08 03/17/2018   Lab Results  Component Value Date   NA 133 (L) 03/20/2018   K 4.0 03/20/2018   CL 98 03/20/2018   CO2 28 03/20/2018   BUN 14 03/20/2018   CREATININE 0.99 03/20/2018   GLUCOSE 163 (H) 03/20/2018    Discharge Medications:   Allergies as of 03/21/2018   No Known Allergies     Medication List    STOP taking these medications   acetaminophen-codeine 300-30 MG tablet Commonly known as:  TYLENOL #3   chlorpheniramine-HYDROcodone 10-8 MG/5ML Suer Commonly known as:  TUSSIONEX   naproxen 375 MG tablet Commonly known as:  NAPROSYN   triamcinolone cream 0.1 % Commonly known as:  KENALOG     TAKE these medications    ARTIFICIAL TEARS OP Place 1 drop into both eyes 4 (four) times daily as needed (for dry eyes).   aspirin 81 MG chewable tablet Chew 1 tablet (81 mg total) by mouth 2 (two) times daily.   budesonide 32 MCG/ACT nasal spray Commonly known as:  RHINOCORT AQUA USE 1 PUFF IN EACH NOSTRIL EVERY DAY AS NEEDED What changed:  See the new instructions.   cefTRIAXone 2 g in sodium chloride 0.9 % 100 mL Inject 2 g into the vein daily.   cholecalciferol 1000 units tablet Commonly known as:  VITAMIN D Take 1,000 Units by mouth daily.   docusate sodium 100 MG capsule Commonly known as:  COLACE Take 1 capsule (100 mg total) by mouth 2 (two) times daily.   gabapentin 400 MG capsule Commonly known as:  NEURONTIN Take 400 mg by mouth 3 (three) times daily.   HYDROcodone-acetaminophen 5-325 MG tablet Commonly known as:  NORCO/VICODIN Take 1-2 tablets by mouth every 4 (four) hours as needed for moderate pain (pain score 4-6).   lisinopril 2.5 MG tablet Commonly known as:  PRINIVIL,ZESTRIL Take 2.5 mg by mouth 2 (two) times daily.   loratadine 10 MG tablet Commonly known as:  CLARITIN Take 10 mg by mouth daily.   Melatonin 3 MG Tabs Take 3 mg by mouth at bedtime as needed (for sleep).   mirabegron ER 25 MG Tb24 tablet Commonly known as:  MYRBETRIQ Myrbetriq 25 mg tablet,extended release  TAKE 1 TABLET (25 MG TOTAL) 2 (TWO) TIMES DAILY BY MOUTH. What changed:    how much to take  how to take this  when to take this  additional instructions   multivitamin with minerals Tabs tablet Take 1 tablet by mouth daily.   nystatin powder Commonly known as:  MYCOSTATIN/NYSTOP Apply topically 3 (three) times daily.   pramipexole 0.5 MG tablet Commonly known as:  MIRAPEX Take 2 tablets (1 mg total) by mouth at bedtime. What changed:    how much to take  when to take this  additional instructions   RYTARY 23.75-95 MG Cpcr Generic drug:  Carbidopa-Levodopa ER Take 3 capsules by  mouth 4 (four) times daily.   vitamin B-12 1000 MCG tablet Commonly known as:  CYANOCOBALAMIN Take 1,000 mcg by mouth daily.   vitamin C 500 MG tablet Commonly known as:  ASCORBIC ACID Take 500 mg by mouth daily.   vitamin E 400 UNIT capsule Generic drug:  vitamin E Take 400 Units by mouth daily.       Diagnostic Studies: Nm Bone Scan 3 Phase  Result Date: 03/10/2018 CLINICAL DATA:  RIGHT knee swelling and redness, fell in April 2019, not medially, history of Parkinson's disease,  RIGHT total knee replacement surgery 2015 EXAM: NUCLEAR MEDICINE 3-PHASE BONE SCAN TECHNIQUE: Radionuclide angiographic images, immediate static blood pool images, and 3-hour delayed static images were obtained of the knees after intravenous injection of radiopharmaceutical. RADIOPHARMACEUTICALS:  23.40 mCi Tc-58m MDP IV COMPARISON:  None Radiographic correlation: RIGHT knee radiographs 11/12/2017 FINDINGS: Vascular phase: Markedly increased blood flow to the RIGHT knee region versus LEFT Blood pool phase: Markedly increased blood pool at the RIGHT knee Delayed phase: Photopenic defect RIGHT knee from knee prosthesis. Significant increased tracer accumulation on delayed images at the proximal tibia adjacent to the prosthesis. Mild uptake of tracer at the posterior aspect of the RIGHT lateral femoral condyle. Findings are consistent with either infection or aseptic loosening of the RIGHT knee prosthesis. No definite evidence of fracture at the proximal tibia on prior RIGHT knee radiographs. Minimal uptake of tracer at the LEFT knee joint, pattern typical of degenerative changes. IMPRESSION: Significantly increased blood flow and blood pool of tracer to the RIGHT knee and surrounding soft tissues compatible with synovitis. In addition, markedly increased delayed tracer accumulation throughout the proximal RIGHT tibia and at the posterior aspect of the RIGHT lateral femoral condyle consistent with either aseptic loosening or  infection of the RIGHT knee prosthesis. Degenerative changes LEFT knee. Electronically Signed   By: Lavonia Dana M.D.   On: 03/10/2018 16:17   US Scrotum  Result Date: 03/20/2018 CLINICAL DATA:  Testicular pain and swelling for 3 days. EXAM: SCROTAL ULTRASOUND DOPPLER ULTRASOUND OF THE TESTICLES TECHNIQUE: Complete ultrasound examination of the testicles, epididymis, and other scrotal structures was performed. Color and spectral Doppler ultrasound were also utilized to evaluate blood flow to the testicles. COMPARISON:  None. FINDINGS: Right testicle Measurements: 4.1 x 2.2 x 3.0 cm. No mass or microlithiasis visualized. Left testicle Measurements: 3.9 x 2.1 x 2.9 cm. No mass or microlithiasis visualized. Right epididymis: 2 subcentimeter cysts, the largest being septated and measuring 3 x 4 x 5 mm. Enlarged, complex appearance of the epididymal body with increased vascularity. Left epididymis: 3 mm cyst. Enlarged, complex appearance of the epididymal body with increased vascularity. Hydrocele:  Small bilateral hydroceles. Varicocele: Borderline left varicocele with vessels measuring up to 3 mm. Pulsed Doppler interrogation of both testes demonstrates normal low resistance arterial and venous waveforms bilaterally. There is diffuse scrotal wall edema and increased vascularity. IMPRESSION: Edema and increased vascularity throughout the scrotal wall and epididymis bilaterally which may reflect epididymitis and cellulitis. No organized abscess. Electronically Signed   By: Logan Bores M.D.   On: 03/20/2018 13:21   Dg Knee Right Port  Result Date: 03/17/2018 CLINICAL DATA:  77 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 New prostheses likely consisting of antibiotic impregnated cement are present in the tibial plateau and overlying the  distal femoral condyles. Associated soft tissue swelling and air within the joint space. A wound VAC is present laterally at the knee joint. IMPRESSION: Surgical changes of revision knee arthroplasty with removal of prior hardware and placement of a temporary prosthesis likely consisting of antibiotic impregnated cement. Electronically Signed   By: Jacqulynn Cadet M.D.   On: 03/17/2018 17:26   US Scrotum Doppler  Result Date: 03/20/2018 CLINICAL DATA:  Testicular pain and swelling for 3 days. EXAM: SCROTAL ULTRASOUND DOPPLER ULTRASOUND OF THE TESTICLES TECHNIQUE: Complete ultrasound examination of the testicles, epididymis, and other scrotal structures was performed. Color  and spectral Doppler ultrasound were also utilized to evaluate blood flow to the testicles. COMPARISON:  None. FINDINGS: Right testicle Measurements: 4.1 x 2.2 x 3.0 cm. No mass or microlithiasis visualized. Left testicle Measurements: 3.9 x 2.1 x 2.9 cm. No mass or microlithiasis visualized. Right epididymis: 2 subcentimeter cysts, the largest being septated and measuring 3 x 4 x 5 mm. Enlarged, complex appearance of the epididymal body with increased vascularity. Left epididymis: 3 mm cyst. Enlarged, complex appearance of the epididymal body with increased vascularity. Hydrocele:  Small bilateral hydroceles. Varicocele: Borderline left varicocele with vessels measuring up to 3 mm. Pulsed Doppler interrogation of both testes demonstrates normal low resistance arterial and venous waveforms bilaterally. There is diffuse scrotal wall edema and increased vascularity. IMPRESSION: Edema and increased vascularity throughout the scrotal wall and epididymis bilaterally which may reflect epididymitis and cellulitis. No organized abscess. Electronically Signed   By: Logan Bores M.D.   On: 03/20/2018 13:21   [place] Ekg Site Rite  Result Date: 03/17/2018 If Site Rite image not attached, placement could not be confirmed due to current cardiac  rhythm.   Disposition: Discharge disposition: 03-Skilled Nursing Facility         Contact information for after-discharge care    Destination    [facility] Preferred SNF .   Service:  Skilled Nursing Contact information: [address] [facility] [place] [phone]               Signed: Lovell Sheehan ,MD 03/21/2018, 11:49 AM

## 2018-03-21 NOTE — Progress Notes (Signed)
[facility] authorization has been received, authorization # [id number]. Per [facility] liaison they will have a wound vac for patient there today. Patient can D/C to [facility] when medically stable. Patient and his caregiver are aware of above. CSW will continue to follow and assist as needed.   [employer], LCSW [phone]

## 2018-03-22 ENCOUNTER — Ambulatory Visit: Payer: No Typology Code available for payment source | Admitting: Physician Assistant

## 2018-03-22 DIAGNOSIS — I10 Essential (primary) hypertension: Secondary | ICD-10-CM | POA: Diagnosis not present

## 2018-03-22 DIAGNOSIS — M79661 Pain in right lower leg: Secondary | ICD-10-CM | POA: Diagnosis not present

## 2018-03-22 DIAGNOSIS — G2581 Restless legs syndrome: Secondary | ICD-10-CM | POA: Diagnosis not present

## 2018-03-22 DIAGNOSIS — J302 Other seasonal allergic rhinitis: Secondary | ICD-10-CM | POA: Diagnosis not present

## 2018-03-22 DIAGNOSIS — Z8546 Personal history of malignant neoplasm of prostate: Secondary | ICD-10-CM | POA: Diagnosis not present

## 2018-03-22 DIAGNOSIS — G20 Parkinson's disease: Secondary | ICD-10-CM | POA: Diagnosis not present

## 2018-03-22 DIAGNOSIS — K5900 Constipation, unspecified: Secondary | ICD-10-CM | POA: Diagnosis not present

## 2018-03-22 LAB — AEROBIC/ANAEROBIC CULTURE W GRAM STAIN (SURGICAL/DEEP WOUND)

## 2018-03-22 LAB — URINE CULTURE: Culture: NO GROWTH

## 2018-03-22 LAB — AEROBIC/ANAEROBIC CULTURE (SURGICAL/DEEP WOUND)

## 2018-03-30 DIAGNOSIS — G2581 Restless legs syndrome: Secondary | ICD-10-CM | POA: Diagnosis not present

## 2018-03-30 DIAGNOSIS — L89301 Pressure ulcer of unspecified buttock, stage 1: Secondary | ICD-10-CM | POA: Diagnosis not present

## 2018-03-30 DIAGNOSIS — L89621 Pressure ulcer of left heel, stage 1: Secondary | ICD-10-CM | POA: Diagnosis not present

## 2018-03-30 DIAGNOSIS — J302 Other seasonal allergic rhinitis: Secondary | ICD-10-CM | POA: Diagnosis not present

## 2018-03-30 DIAGNOSIS — L89611 Pressure ulcer of right heel, stage 1: Secondary | ICD-10-CM | POA: Diagnosis not present

## 2018-03-30 DIAGNOSIS — T8453XA Infection and inflammatory reaction due to internal right knee prosthesis, initial encounter: Secondary | ICD-10-CM | POA: Diagnosis not present

## 2018-03-30 DIAGNOSIS — G20 Parkinson's disease: Secondary | ICD-10-CM | POA: Diagnosis not present

## 2018-03-30 DIAGNOSIS — Z8546 Personal history of malignant neoplasm of prostate: Secondary | ICD-10-CM | POA: Diagnosis not present

## 2018-03-30 DIAGNOSIS — D649 Anemia, unspecified: Secondary | ICD-10-CM | POA: Diagnosis not present

## 2018-03-30 DIAGNOSIS — I10 Essential (primary) hypertension: Secondary | ICD-10-CM | POA: Diagnosis not present

## 2018-03-30 DIAGNOSIS — K5900 Constipation, unspecified: Secondary | ICD-10-CM | POA: Diagnosis not present

## 2018-04-09 DIAGNOSIS — G20 Parkinson's disease: Secondary | ICD-10-CM | POA: Diagnosis not present

## 2018-04-09 DIAGNOSIS — J302 Other seasonal allergic rhinitis: Secondary | ICD-10-CM | POA: Diagnosis not present

## 2018-04-09 DIAGNOSIS — K5900 Constipation, unspecified: Secondary | ICD-10-CM | POA: Diagnosis not present

## 2018-04-09 DIAGNOSIS — I10 Essential (primary) hypertension: Secondary | ICD-10-CM | POA: Diagnosis not present

## 2018-04-09 DIAGNOSIS — M79661 Pain in right lower leg: Secondary | ICD-10-CM | POA: Diagnosis not present

## 2018-04-09 DIAGNOSIS — Z8546 Personal history of malignant neoplasm of prostate: Secondary | ICD-10-CM | POA: Diagnosis not present

## 2018-04-12 DIAGNOSIS — T8453XA Infection and inflammatory reaction due to internal right knee prosthesis, initial encounter: Secondary | ICD-10-CM | POA: Diagnosis not present

## 2018-04-13 ENCOUNTER — Telehealth: Payer: Self-pay | Admitting: Urology

## 2018-04-13 NOTE — Telephone Encounter (Signed)
That seems reasonable.  They can also try other alternatives including a penile clamp or a condom catheter if he is circumcised.  Hollice Espy, MD

## 2018-04-13 NOTE — Telephone Encounter (Signed)
I spoke to Roger Rodriguez about Roger Rodriguez. He had a knee replacement that got infected then they had to take the knee replacement out and put a temporary knee in. He is at [employer] and is unable to move at all right now. He is incontinent and also has had a fungal infection from being wet with urine. Patient and son are wanting to know if he can have a catheter placed to keep from getting wet and sitting in urine so often. I informed Roger Rodriguez it may be for the best while he is immobile at this time. Roger Rodriguez is going to find out from [facility] if they will do a catheter placement by order if he will need to come in our office to have the catheter placed. I wanted to let you know and confirm this is ok? Please advise

## 2018-04-13 NOTE — Telephone Encounter (Signed)
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  He is incontinent right now and son, Roger Rodriguez wants to talk with [place] about a catheter.  He is a patient of Brandon's.  Please call Roger Rodriguez at [phone].  Son said he has called twice already and no one returned his call, but I didn't see a note.

## 2018-04-14 NOTE — Telephone Encounter (Signed)
[name] notified, he will talk to his dad and [facility] to see what is best.

## 2018-04-15 IMAGING — MR MR HEAD W/O CM
10 series · 48 of 48 positions shown · non-contrast
Comparison: CT 10/12/2017, MRI 04/12/2013

CLINICAL DATA: Altered level of consciousness.

EXAM:
MRI HEAD WITHOUT CONTRAST
TECHNIQUE: Multiplanar, multiecho pulse sequences of the brain and surrounding
structures were obtained without intravenous contrast.

[Series 2: T1 · sagittal · 5.0mm · 0.45mm/px · 3 of 27 slices shown (1 of 2)]
[im 1/27]
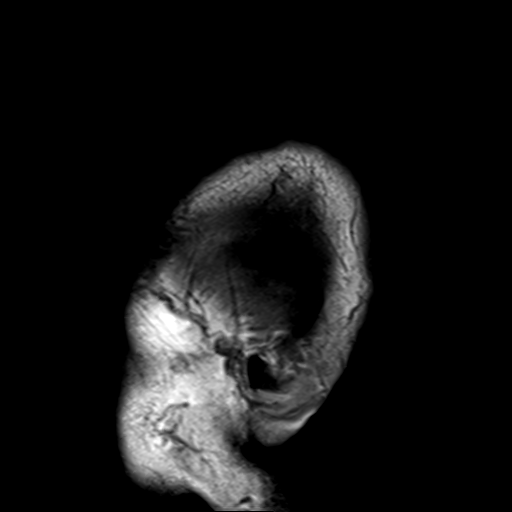
[im 14/27]
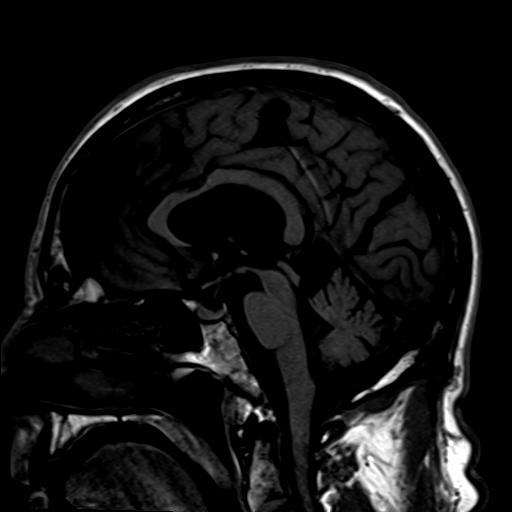
[im 27/27]
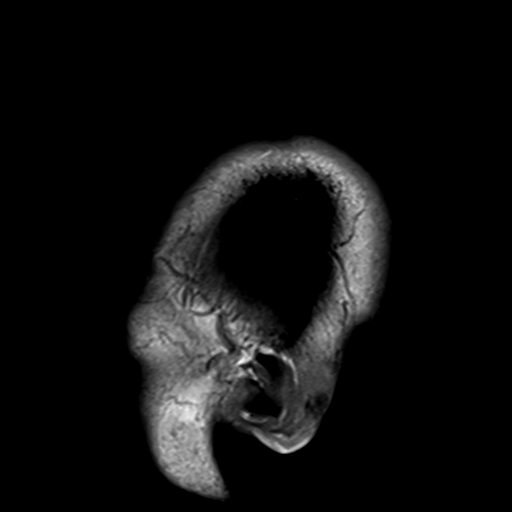

[Series 4: DWI · axial · 3.0mm · 1.80mm/px · z∈[-63,+99]mm · 5 of 55 slices shown (1 of 2)]
[im 1/55]
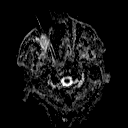
[im 14/55]
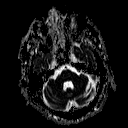
[im 28/55]
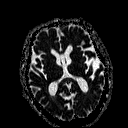
[im 41/55]
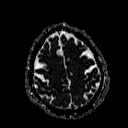
[im 55/55]
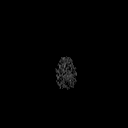

[Series 6: DWI · coronal · 3.0mm · 1.80mm/px · 4 of 45 slices shown (2 of 2)]
[im 1/45]
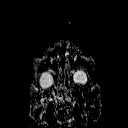
[im 15/45]
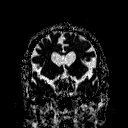
[im 30/45]
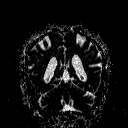
[im 45/45]
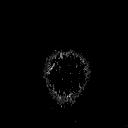

[Series 8: FLAIR · axial · 3.0mm · 0.45mm/px · z∈[-59,+97]mm · 5 of 53 slices shown]
[im 1/53]
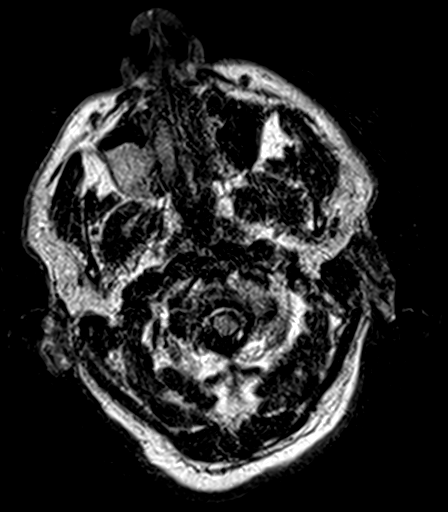
[im 14/53]
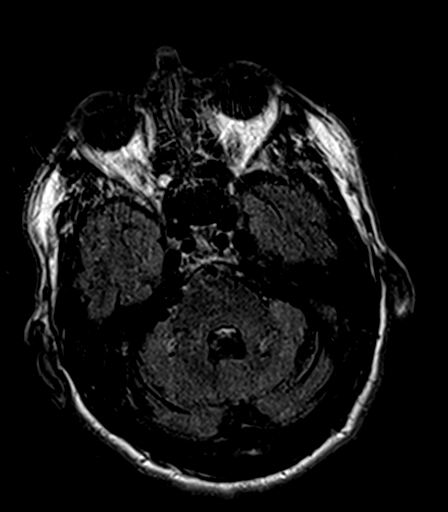
[im 27/53]
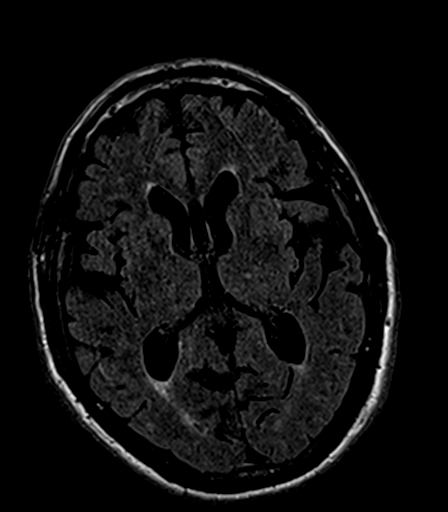
[im 40/53]
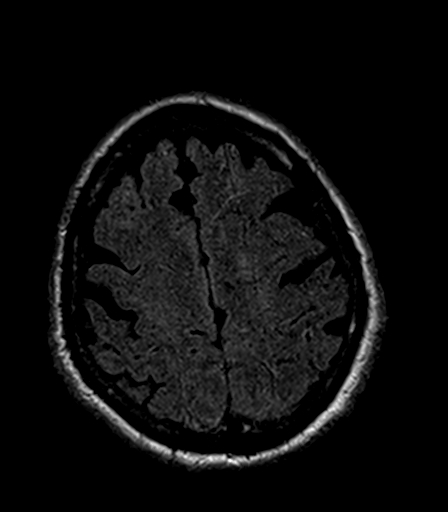
[im 53/53]
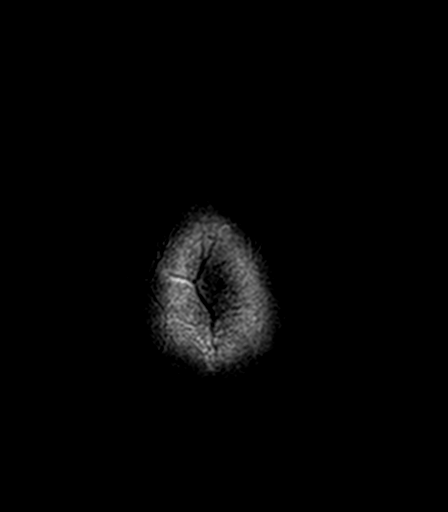

[Series 9: T2 · axial · 5.0mm · 0.45mm/px · z∈[-66,+103]mm · 2 of 27 slices shown (1 of 3)]
[im 1/27]
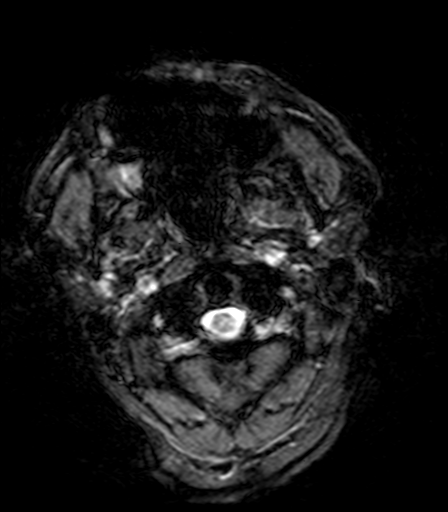
[im 27/27]
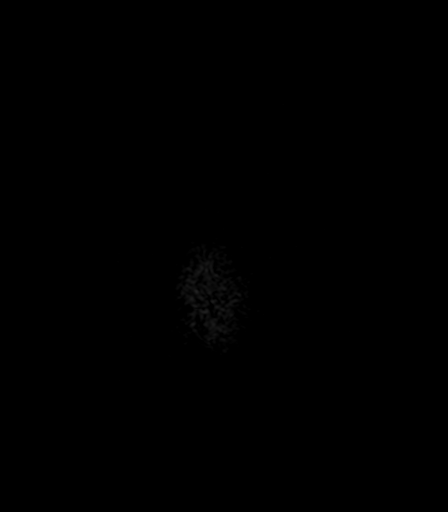

[Series 10: T2 · axial · 5.0mm · 0.45mm/px · z∈[-66,+103]mm · 2 of 27 slices shown (2 of 3)]
[im 1/27]
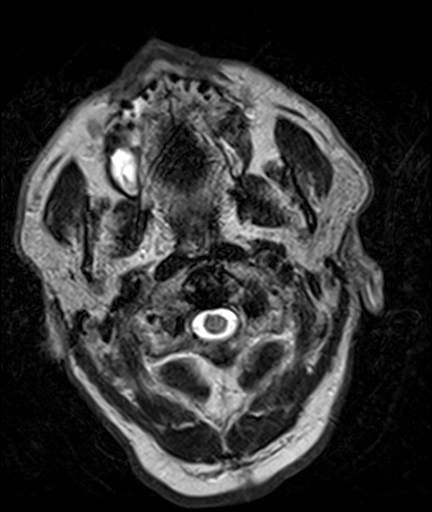
[im 27/27]
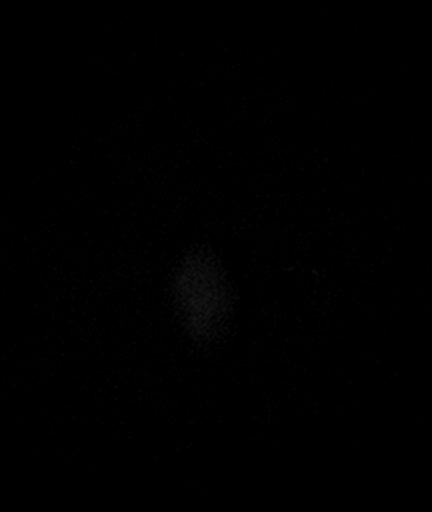

[Series 11: T1 · axial · 1.0mm · 1.00mm/px · z∈[-69,+106]mm · 16 of 176 slices shown (2 of 2)]
[im 1/176]
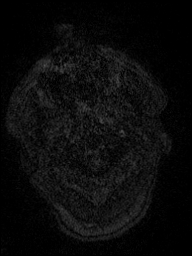
[im 12/176]
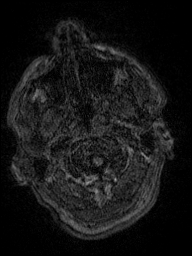
[im 24/176]
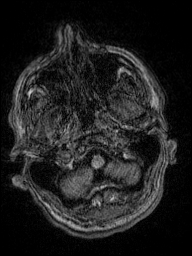
[im 36/176]
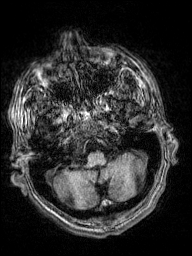
[im 47/176]
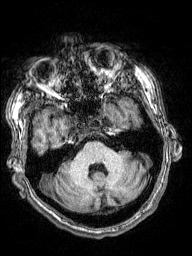
[im 59/176]
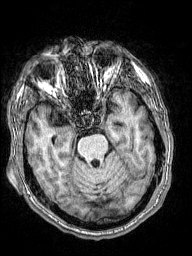
[im 71/176]
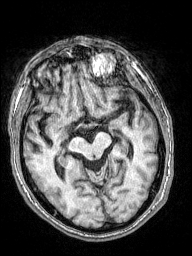
[im 82/176]
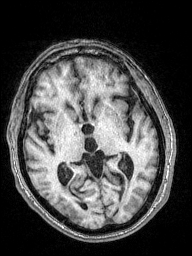
[im 94/176]
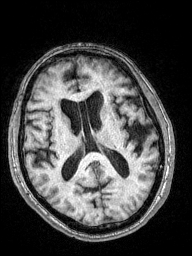
[im 106/176]
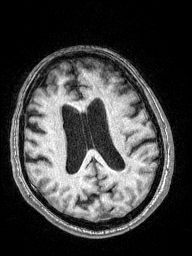
[im 117/176]
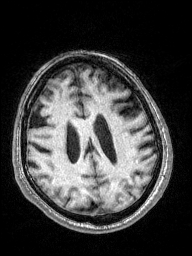
[im 129/176]
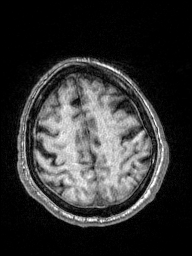
[im 141/176]
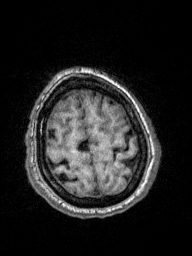
[im 152/176]
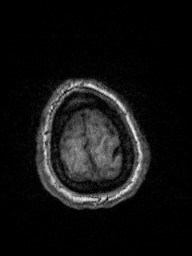
[im 164/176]
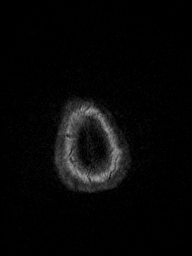
[im 176/176]
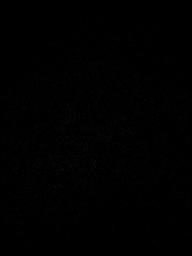

[Series 12: T2 · coronal · 5.0mm · 0.49mm/px · 2 of 27 slices shown (3 of 3)]
[im 1/27]
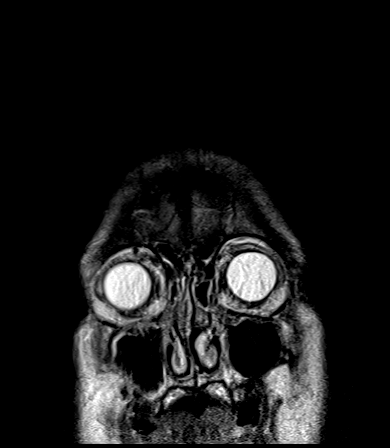
[im 27/27]
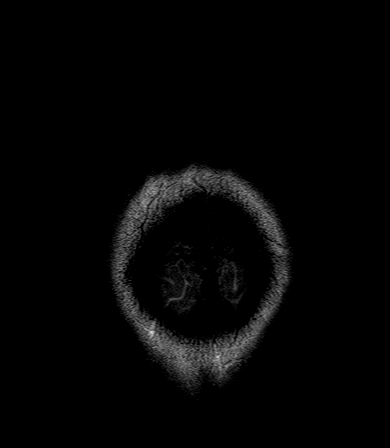

[Series 100: (id) · axial · 3.0mm · 1.80mm/px · z∈[-63,+99]mm · 5 of 55 slices shown]
[im 1/55]
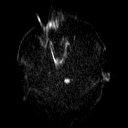
[im 14/55]
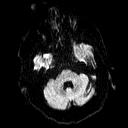
[im 28/55]
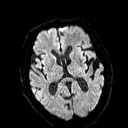
[im 41/55]
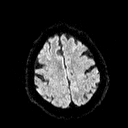
[im 55/55]
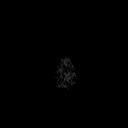

[Series 101: (id) cor · coronal · 3.0mm · 1.80mm/px · 4 of 45 slices shown]
[im 1/45]
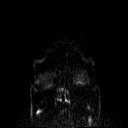
[im 15/45]
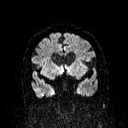
[im 30/45]
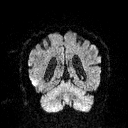
[im 45/45]
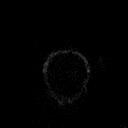

[48 of 48 positions shown; findings below may reference images not displayed]

FINDINGS: Brain: Generalized atrophy with progression since 8546. Negative for
hydrocephalus.

Negative for acute infarct. Mild chronic white matter changes
similar to 8546. Brainstem and basal ganglia intact. Negative for
hemorrhage mass or edema

Vascular: Normal arterial flow void

Skull and upper cervical spine: Negative

Sinuses/Orbits: Retention cyst and mucosal edema right maxillary
sinus. Bilateral cataract removal

Other: None
IMPRESSION: No acute abnormality. Generalized atrophy and mild chronic
microvascular ischemia in the white matter.

## 2018-04-16 IMAGING — DX DG CHEST 1V
1 series · 1 of 1 positions shown · non-contrast
Comparison: October 12, 2017

CLINICAL DATA: Fever.  Recent fall

EXAM:
CHEST  1 VIEW

[chest ap]
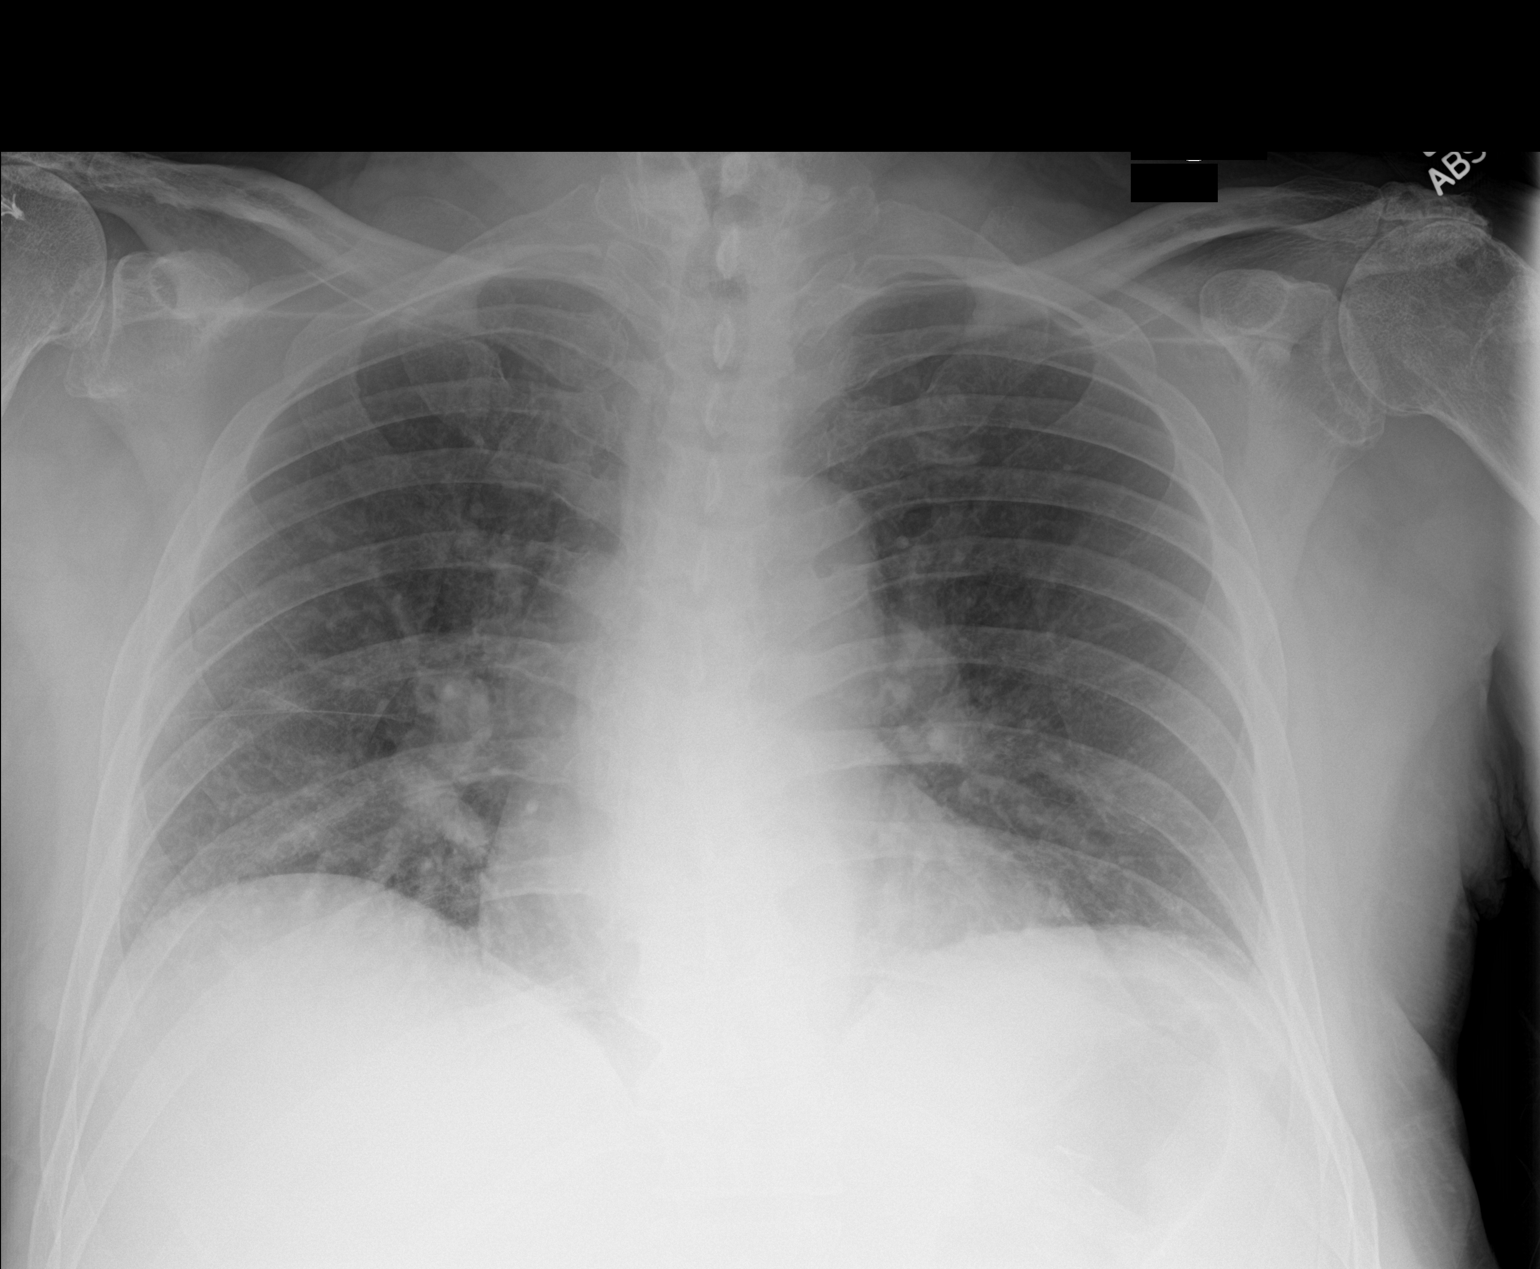

[1 of 1 positions shown; findings below may reference images not displayed]

FINDINGS: No edema or consolidation. Heart is upper normal in size with
pulmonary vascularity within normal limits. No adenopathy. No
pneumothorax. There is degenerative change in each shoulder. There
is postoperative change in the right shoulder region. There are
scattered foci of carotid artery calcification.
IMPRESSION: No edema or consolidation. Heart upper normal in size. Foci of
carotid artery calcification noted. There is arthropathy in both
shoulders with superior migration of each humeral head, a finding
indicative of chronic rotator cuff tears.

## 2018-04-19 ENCOUNTER — Other Ambulatory Visit: Payer: Self-pay | Admitting: *Deleted

## 2018-04-19 NOTE — Patient Outreach (Signed)
[facility]) Care Management  04/19/2018  Roger Rodriguez April 06, 1941 [id number]  Attended discharge planning meeting at [facility] with [place] Team member Marlowe Kays.  PT stated patient still unable to bear weight to left knee nor transfer with one person.  Nursing stated patient continues on antibiotics until 05/02/18.  Met with patient and caregiver at the bedside along with [facility].  Patient stated he continues to struggle with transfers and does not have a clear discharge plan yet.  Patient was grateful wound vac had been removed . Patient noted to have steri strips to left knee, knee was noted to be swollen. Patient in good spirits even with self stated slow progress.  Plan to re-visit patient next week at facility visit.   [name] RN, BSN [place] Acute Care Coordinator [phone]) Business Mobile [phone]) Toll free office

## 2018-04-21 DIAGNOSIS — G2581 Restless legs syndrome: Secondary | ICD-10-CM | POA: Diagnosis not present

## 2018-04-21 DIAGNOSIS — I10 Essential (primary) hypertension: Secondary | ICD-10-CM | POA: Diagnosis not present

## 2018-04-21 DIAGNOSIS — G20 Parkinson's disease: Secondary | ICD-10-CM | POA: Diagnosis not present

## 2018-04-21 DIAGNOSIS — J302 Other seasonal allergic rhinitis: Secondary | ICD-10-CM | POA: Diagnosis not present

## 2018-04-21 DIAGNOSIS — K5900 Constipation, unspecified: Secondary | ICD-10-CM | POA: Diagnosis not present

## 2018-04-21 DIAGNOSIS — M25561 Pain in right knee: Secondary | ICD-10-CM | POA: Diagnosis not present

## 2018-04-21 DIAGNOSIS — Z8546 Personal history of malignant neoplasm of prostate: Secondary | ICD-10-CM | POA: Diagnosis not present

## 2018-04-26 ENCOUNTER — Other Ambulatory Visit: Payer: Self-pay | Admitting: *Deleted

## 2018-04-26 NOTE — Patient Outreach (Signed)
[facility] ([facility]) Care Management  04/26/2018  Roger Rodriguez 08/07/1940 [id number]   Attended discharge planning meeting at [facility] with [facility] UM Team member Connie.  PT stating patient continues to be partial weight bearing to Right knee.  PT stated patient is still requiring a hoyer lift to get out of bed and is a 2 person transfer when up.  SW stated patient continues on IV antibiotics until 05/02/18. SW stated patient does have private pay assistants 24/7 even while at [facility]   Met with patient and private assistant at the bedside.  Patient stated he had made a small amount of progress and was able to move his Right leg minimally. Patient stated he has an upcoming ortho appointment to discuss weight bearing status and knee replacement.  [facility] UM Team member discussed with patient medicare days.  Patient in good spirits although stating he was anxious to get this behind him.   Plan to continue to follow patient and monitor for [facility] CM needs.     RN, BSN [facility]  Post Acute Care Coordinator ([phone]) Business Mobile ([phone]) Toll free office      

## 2018-05-03 ENCOUNTER — Other Ambulatory Visit: Payer: Self-pay | Admitting: *Deleted

## 2018-05-03 NOTE — Patient Outreach (Signed)
[facility] ([facility]) Care Management  05/03/2018  Sat[place] E Howdeshell 02/16/1941 [id number]   Attended interdisciplinary discharge team meeting at [facility] with [facility] UM team member Connie.  PT states patient was able to stand and hop 2ft with walker which is progress.  Continues using the standing machine for transfers in the bathroom.  Nursing stated antibiotics are completed.   Met with patient at the bedside with [facility] UM team member Connie and [facility] Lori.  Patient states he went to the wound care MD last week for him to assess his right knee wound from where drain was removed. Next appointment made for Nov 5 to reassess wound.  Patient stated he was not making fast progress and is still requiring 2 people to transfer.  Connie talked with patient about medicare days.   Plan to visit patient next week at facility visit.     RN, BSN [facility]  Post Acute Care Coordinator ([phone]) Business Mobile ([phone]) Toll free office       

## 2018-05-09 ENCOUNTER — Encounter: Payer: PPO | Attending: Physician Assistant | Admitting: Physician Assistant

## 2018-05-09 DIAGNOSIS — G20 Parkinson's disease: Secondary | ICD-10-CM | POA: Diagnosis not present

## 2018-05-09 DIAGNOSIS — I10 Essential (primary) hypertension: Secondary | ICD-10-CM | POA: Diagnosis not present

## 2018-05-09 DIAGNOSIS — I872 Venous insufficiency (chronic) (peripheral): Secondary | ICD-10-CM | POA: Insufficient documentation

## 2018-05-09 DIAGNOSIS — T8189XA Other complications of procedures, not elsewhere classified, initial encounter: Secondary | ICD-10-CM | POA: Diagnosis not present

## 2018-05-09 DIAGNOSIS — L97812 Non-pressure chronic ulcer of other part of right lower leg with fat layer exposed: Secondary | ICD-10-CM | POA: Diagnosis not present

## 2018-05-09 NOTE — Progress Notes (Addendum)
CAPRICE, WASKO ([id number]) Visit Report for 05/09/2018 Allergy List Details Patient Name: Roger Rodriguez, Roger Rodriguez Date of Service: 05/09/2018 8:45 AM Medical Record Number: [id number] Patient Account Number: [id number] Date of Birth/Sex: Jan 10, 1941 (77 y.o. M) Treating RN: Montey Hora Primary Care Ahmadou Bolz: Cranford Mon, Delfino Lovett Other Clinician: Referring Ashani Pumphrey: Kurtis Bushman Treating Shristi Scheib/Extender: Melburn Hake, HOYT Weeks in Treatment: 0 Allergies Active Allergies Sulfa (Sulfonamide Antibiotics) Allergy Notes Electronic Signature(s) Signed: 05/09/2018 8:38:21 AM By: Montey Hora Entered By: Montey Hora on 05/09/2018 08:38:21 IKEY, OMARY ([id number]) -------------------------------------------------------------------------------- Arrival Information Details Patient Name: Roger Rodriguez Date of Service: 05/09/2018 8:45 AM Medical Record Number: [id number] Patient Account Number: [id number] Date of Birth/Sex: 01/21/1941 (77 y.o. M) Treating RN: Cornell Barman Primary Care Saranya Harlin: Cranford Mon, Delfino Lovett Other Clinician: Referring Ananya Mccleese: Kurtis Bushman Treating Saphyra Hutt/Extender: Melburn Hake, HOYT Weeks in Treatment: 0 Visit Information Patient Arrived: Wheel Chair Arrival Time: 08:43 Accompanied By: self Transfer Assistance: None Patient Identification Verified: Yes Secondary Verification Process Yes Completed: Patient Has Alerts: Yes Patient Alerts: 03/11/18 L 1.13, R 1.45 History Since Last Visit Added or deleted any medications: No Any new allergies or adverse reactions: No Had a fall or experienced change in activities of daily living that may affect risk of falls: No Signs or symptoms of abuse/neglect since last visito No Hospitalized since last visit: No Implantable device outside of the clinic excluding cellular tissue based products placed in the center since last visit: No Electronic Signature(s) Signed: 05/09/2018 5:02:27 PM By: Montey Hora Entered By:  Montey Hora on 05/09/2018 09:05:03 Roger Rodriguez ([id number]) -------------------------------------------------------------------------------- Clinic Level of Care Assessment Details Patient Name: Roger Rodriguez Date of Service: 05/09/2018 8:45 AM Medical Record Number: [id number] Patient Account Number: [id number] Date of Birth/Sex: 09/25/1940 (77 y.o. M) Treating RN: Montey Hora Primary Care Nomie Buchberger: Cranford Mon, Delfino Lovett Other Clinician: Referring Kona Lover: Kurtis Bushman Treating Preslee Regas/Extender: Melburn Hake, HOYT Weeks in Treatment: 0 Clinic Level of Care Assessment Items TOOL 1 Quantity Score []  - Use when EandM and Procedure is performed on INITIAL visit 0 ASSESSMENTS - Nursing Assessment / Reassessment X - General Physical Exam (combine w/ comprehensive assessment (listed just below) when 1 20 performed on new pt. evals) X- 1 25 Comprehensive Assessment (HX, ROS, Risk Assessments, Wounds Hx, etc.) ASSESSMENTS - Wound and Skin Assessment / Reassessment []  - Dermatologic / Skin Assessment (not related to wound area) 0 ASSESSMENTS - Ostomy and/or Continence Assessment and Care []  - Incontinence Assessment and Management 0 []  - 0 Ostomy Care Assessment and Management (repouching, etc.) PROCESS - Coordination of Care X - Simple Patient / Family Education for ongoing care 1 15 []  - 0 Complex (extensive) Patient / Family Education for ongoing care X- 1 10 Staff obtains Programmer, systems, Records, Test Results / Process Orders []  - 0 Staff telephones HHA, Nursing Homes / Clarify orders / etc []  - 0 Routine Transfer to another Facility (non-emergent condition) []  - 0 Routine Hospital Admission (non-emergent condition) X- 1 15 New Admissions / Biomedical engineer / Ordering NPWT, Apligraf, etc. []  - 0 Emergency Hospital Admission (emergent condition) PROCESS - Special Needs []  - Pediatric / Minor Patient Management 0 []  - 0 Isolation Patient Management []  - 0 Hearing  / Language / Visual special needs []  - 0 Assessment of Community assistance (transportation, D/C planning, etc.) []  - 0 Additional assistance / Altered mentation []  - 0 Support Surface(s) Assessment (bed, cushion, seat, etc.) Roger Rodriguez, Roger E. ([id number]) INTERVENTIONS - Miscellaneous []  - External ear exam 0 []  - 0  Patient Transfer (multiple staff / Civil Service fast streamer / Similar devices) []  - 0 Simple Staple / Suture removal (25 or less) []  - 0 Complex Staple / Suture removal (26 or more) []  - 0 Hypo/Hyperglycemic Management (do not check if billed separately) []  - 0 Ankle / Brachial Index (ABI) - do not check if billed separately Has the patient been seen at the hospital within the last three years: Yes Total Score: 85 Level Of Care: New/Established - Level 3 Electronic Signature(s) Signed: 05/09/2018 5:02:27 PM By: Montey Hora Entered By: Montey Hora on 05/09/2018 09:44:11 Roger Rodriguez, Roger Rodriguez ([id number]) -------------------------------------------------------------------------------- Encounter Discharge Information Details Patient Name: Roger Rodriguez Date of Service: 05/09/2018 8:45 AM Medical Record Number: [id number] Patient Account Number: [id number] Date of Birth/Sex: 1940-12-27 (77 y.o. M) Treating RN: Montey Hora Primary Care Ean Gettel: Cranford Mon, Delfino Lovett Other Clinician: Referring Gabriele Loveland: Kurtis Bushman Treating Theordore Cisnero/Extender: Melburn Hake, HOYT Weeks in Treatment: 0 Encounter Discharge Information Items Discharge Condition: Stable Ambulatory Status: Wheelchair Discharge Destination: Skilled Nursing Facility Telephoned: No Orders Sent: Yes Transportation: Private Auto Accompanied By: caregiver and son Schedule Follow-up Appointment: Yes Clinical Summary of Care: Electronic Signature(s) Signed: 05/09/2018 5:02:27 PM By: Montey Hora Entered By: Montey Hora on 05/09/2018 09:46:08 Roger Rodriguez  ([id number]) -------------------------------------------------------------------------------- Lower Extremity Assessment Details Patient Name: Roger Rodriguez Date of Service: 05/09/2018 8:45 AM Medical Record Number: [id number] Patient Account Number: [id number] Date of Birth/Sex: [birth year] (77 y.o. M) Treating RN: Montey Hora Primary Care Tayveon Lombardo: Cranford Mon, Delfino Lovett Other Clinician: Referring Josedejesus Marcum: Kurtis Bushman Treating Saralyn Willison/Extender: Melburn Hake, HOYT Weeks in Treatment: 0 Vascular Assessment Pulses: Dorsalis Pedis Palpable: [Left:Yes] Posterior Tibial Extremity colors, hair growth, and conditions: Extremity Color: [Left:Normal] Hair Growth on Extremity: [Left:No] Temperature of Extremity: [Left:Warm] Capillary Refill: [Left:< 3 seconds] Toe Nail Assessment Left: Right: Thick: Yes Discolored: Yes Deformed: Yes Improper Length and Hygiene: No Notes 03/11/18 L 1.13, R 1.45 Electronic Signature(s) Signed: 05/09/2018 5:02:27 PM By: Montey Hora Entered By: Montey Hora on 05/09/2018 09:08:06 Roger Rodriguez, Roger Rodriguez ([id number]) -------------------------------------------------------------------------------- Multi Wound Chart Details Patient Name: Roger Rodriguez Date of Service: 05/09/2018 8:45 AM Medical Record Number: [id number] Patient Account Number: [id number] Date of Birth/Sex: [birth year] (77 y.o. M) Treating RN: Montey Hora Primary Care Quinlin Conant: Cranford Mon, Delfino Lovett Other Clinician: Referring Kalasia Crafton: Kurtis Bushman Treating Rhiann Boucher/Extender: Melburn Hake, HOYT Weeks in Treatment: 0 Vital Signs Height(in): 71 Pulse(bpm): 80 Weight(lbs): 212 Blood Pressure(mmHg): 118/57 Body Mass Index(BMI): 30 Temperature(F): 98.2 Respiratory Rate 16 (breaths/min): Photos: [2:No Photos] [N/A:N/A] Wound Location: [2:Right Knee - Lateral] [N/A:N/A] Wounding Event: [2:Surgical Injury] [N/A:N/A] Primary Etiology: [2:Open Surgical Wound] [N/A:N/A] Comorbid History:  [2:Cataracts, Chronic sinus problems/congestion, Lymphedema, Hypertension, Osteoarthritis] [N/A:N/A] Date Acquired: [2:03/17/2018] [N/A:N/A] Weeks of Treatment: [2:0] [N/A:N/A] Wound Status: [2:Open] [N/A:N/A] Classification: [2:Full Thickness Without Exposed Support Structures] [N/A:N/A] Exudate Amount: [2:Medium] [N/A:N/A] Exudate Type: [2:Sanguinous] [N/A:N/A] Exudate Color: [2:red] [N/A:N/A] Wound Margin: [2:Flat and Intact] [N/A:N/A] Granulation Amount: [2:Large (67-100%)] [N/A:N/A] Necrotic Amount: [2:Small (1-33%)] [N/A:N/A] Exposed Structures: [2:Fat Layer (Subcutaneous Tissue) Exposed: Yes Fascia: No Tendon: No Muscle: No Joint: No Bone: No] [N/A:N/A] Epithelialization: [2:None] [N/A:N/A] Periwound Skin Texture: [2:Scarring: Yes Excoriation: No Induration: No Callus: No Crepitus: No Rash: No] [N/A:N/A] Periwound Skin Moisture: [2:Maceration: No Dry/Scaly: No] [N/A:N/A] Periwound Skin Color: [2:Atrophie Blanche: No Cyanosis: No] [N/A:N/A] Ecchymosis: No Erythema: No Hemosiderin Staining: No Mottled: No Pallor: No Rubor: No Temperature: No Abnormality N/A N/A Tenderness on Palpation: No N/A N/A Wound Preparation: Ulcer Cleansing: N/A N/A Rinsed/Irrigated with Saline Topical Anesthetic Applied: None Procedures Performed: CHEM CAUT  GRANULATION N/A N/A TISS Treatment Notes Electronic Signature(s) Signed: 05/09/2018 5:02:27 PM By: Montey Hora Entered By: Montey Hora on 05/09/2018 09:38:53 Roger Rodriguez, Roger Rodriguez ([id number]) -------------------------------------------------------------------------------- Multi-Disciplinary Care Plan Details Patient Name: Roger Rodriguez Date of Service: 05/09/2018 8:45 AM Medical Record Number: [id number] Patient Account Number: [id number] Date of Birth/Sex: 1941-04-03 (77 y.o. M) Treating RN: Montey Hora Primary Care Aylissa Heinemann: Cranford Mon, Delfino Lovett Other Clinician: Referring Dorice Stiggers: Kurtis Bushman Treating Frannie Shedrick/Extender: Melburn Hake, HOYT Weeks in Treatment: 0 Active Inactive ` Abuse / Safety / Falls / Self Care Management Nursing Diagnoses: Impaired physical mobility Goals: Patient will remain injury free related to falls Date Initiated: 05/09/2018 Target Resolution Date: 07/16/2018 Goal Status: Active Interventions: Assess fall risk on admission and as needed Notes: ` Orientation to the Wound Care Program Nursing Diagnoses: Knowledge deficit related to the wound healing center program Goals: Patient/caregiver will verbalize understanding of the [facility] Program Date Initiated: 05/09/2018 Target Resolution Date: 07/16/2018 Goal Status: Active Interventions: Provide education on orientation to the wound center Notes: ` Wound/Skin Impairment Nursing Diagnoses: Impaired tissue integrity Goals: Ulcer/skin breakdown will heal within 14 weeks Date Initiated: 05/09/2018 Target Resolution Date: 07/16/2018 Goal Status: Active Interventions: Roger Rodriguez, Roger Rodriguez ([id number]) Assess patient/caregiver ability to obtain necessary supplies Assess patient/caregiver ability to perform ulcer/skin care regimen upon admission and as needed Assess ulceration(s) every visit Notes: Electronic Signature(s) Signed: 05/09/2018 5:02:27 PM By: Montey Hora Entered By: Montey Hora on 05/09/2018 09:38:38 Roger Rodriguez ([id number]) -------------------------------------------------------------------------------- Pain Assessment Details Patient Name: Roger Rodriguez Date of Service: 05/09/2018 8:45 AM Medical Record Number: [id number] Patient Account Number: [id number] Date of Birth/Sex: 03-Apr-1941 (77 y.o. M) Treating RN: Cornell Barman Primary Care Hazelgrace Bonham: Cranford Mon, Delfino Lovett Other Clinician: Referring Abisai Coble: Kurtis Bushman Treating Vear Staton/Extender: Melburn Hake, HOYT Weeks in Treatment: 0 Active Problems Location of Pain Severity and Description of Pain Patient Has Paino No Site Locations Pain  Management and Medication Current Pain Management: Electronic Signature(s) Signed: 05/09/2018 3:24:13 PM By: Gretta Cool, BSN, RN, CWS, Kim RN, BSN Signed: 05/09/2018 3:33:32 PM By: Lorine Bears RCP, RRT, CHT Entered By: Lorine Bears on 05/09/2018 08:43:57 Roger Rodriguez, Roger Rodriguez ([id number]) -------------------------------------------------------------------------------- Patient/Caregiver Education Details Patient Name: Roger Rodriguez Date of Service: 05/09/2018 8:45 AM Medical Record Number: [id number] Patient Account Number: [id number] Date of Birth/Gender: 04/01/1941 (77 y.o. M) Treating RN: Montey Hora Primary Care Physician: Cranford Mon, Delfino Lovett Other Clinician: Referring Physician: Kurtis Bushman Treating Physician/Extender: Sharalyn Ink in Treatment: 0 Education Assessment Education Provided To: Caregiver SNF nurses via written orders Education Topics Provided Electronic Signature(s) Signed: 05/09/2018 5:02:27 PM By: Montey Hora Entered By: Montey Hora on 05/09/2018 09:44:38 Roger Rodriguez, Roger Rodriguez ([id number]) -------------------------------------------------------------------------------- Wound Assessment Details Patient Name: Roger Rodriguez Date of Service: 05/09/2018 8:45 AM Medical Record Number: [id number] Patient Account Number: [id number] Date of Birth/Sex: 1941/07/04 (77 y.o. M) Treating RN: Montey Hora Primary Care Prestin Munch: Cranford Mon, Delfino Lovett Other Clinician: Referring Candance Bohlman: Kurtis Bushman Treating Turner Kunzman/Extender: Melburn Hake, HOYT Weeks in Treatment: 0 Wound Status Wound Number: 2 Primary Open Surgical Wound Etiology: Wound Location: Right, Lateral Knee Wound Open Wounding Event: Surgical Injury Status: Date Acquired: 03/17/2018 Comorbid Cataracts, Chronic sinus problems/congestion, Weeks Of Treatment: 0 History: Lymphedema, Hypertension, Osteoarthritis Clustered Wound: No Wound Measurements Length: (cm) 0.9 Width:  (cm) 0.9 Depth: (cm) 0.1 Area: (cm) 0.636 Volume: (cm) 0.064 % Reduction in Area: % Reduction in Volume: Epithelialization: None Tunneling: No Undermining: No Wound Description Full Thickness Without Exposed Support Classification: Structures Wound Margin: Flat and  Intact Exudate Medium Amount: Exudate Type: Sanguinous Exudate Color: red Foul Odor After Cleansing: No Slough/Fibrino No Wound Bed Granulation Amount: Large (67-100%) Exposed Structure Granulation Quality: Hyper-granulation Fascia Exposed: No Necrotic Amount: Small (1-33%) Fat Layer (Subcutaneous Tissue) Exposed: Yes Necrotic Quality: Adherent Slough Tendon Exposed: No Muscle Exposed: No Joint Exposed: No Bone Exposed: No Periwound Skin Texture Texture Color No Abnormalities Noted: No No Abnormalities Noted: No Callus: No Atrophie Blanche: No Crepitus: No Cyanosis: No Excoriation: No Ecchymosis: No Induration: No Erythema: No Rash: No Hemosiderin Staining: No Scarring: Yes Mottled: No Pallor: No Moisture Rubor: No No Abnormalities Noted: No Dry / Scaly: No Temperature / Pain Roger Rodriguez, Roger E. ([id number]) Maceration: No Temperature: No Abnormality Wound Preparation Ulcer Cleansing: Rinsed/Irrigated with Saline Topical Anesthetic Applied: None Treatment Notes Wound #2 (Right, Lateral Knee) 1. Cleansed with: Clean wound with Normal Saline 4. Dressing Applied: Hydrafera Blue Other dressing (specify in notes) 5. Secondary Dressing Applied Bordered Foam Dressing Notes oil emulsion contact layer, hydrafera blue, bordered foam dressing Electronic Signature(s) Signed: 05/09/2018 5:02:27 PM By: Montey Hora Entered By: Montey Hora on 05/09/2018 09:39:04 Roger Rodriguez, Roger Rodriguez ([id number]) -------------------------------------------------------------------------------- Roger Rodriguez Details Patient Name: Roger Rodriguez Date of Service: 05/09/2018 8:45 AM Medical Record Number:  [id number] Patient Account Number: [id number] Date of Birth/Sex: 10-Dec-1940 (77 y.o. M) Treating RN: Cornell Barman Primary Care Meribeth Vitug: Cranford Mon, Delfino Lovett Other Clinician: Referring Leotta Weingarten: Kurtis Bushman Treating Luciel Brickman/Extender: Melburn Hake, HOYT Weeks in Treatment: 0 Vital Signs Time Taken: 08:45 Temperature (F): 98.2 Height (in): 71 Pulse (bpm): 80 Source: Stated Respiratory Rate (breaths/min): 16 Weight (lbs): 212 Blood Pressure (mmHg): 118/57 Source: Stated Reference Range: 80 - 120 mg / dl Body Mass Index (BMI): 29.6 Electronic Signature(s) Signed: 05/09/2018 3:33:32 PM By: Lorine Bears RCP, RRT, CHT Entered By: Lorine Bears on 05/09/2018 08:46:50

## 2018-05-10 DIAGNOSIS — G2581 Restless legs syndrome: Secondary | ICD-10-CM | POA: Diagnosis not present

## 2018-05-10 DIAGNOSIS — M25561 Pain in right knee: Secondary | ICD-10-CM | POA: Diagnosis not present

## 2018-05-10 DIAGNOSIS — K5900 Constipation, unspecified: Secondary | ICD-10-CM | POA: Diagnosis not present

## 2018-05-10 DIAGNOSIS — J302 Other seasonal allergic rhinitis: Secondary | ICD-10-CM | POA: Diagnosis not present

## 2018-05-10 DIAGNOSIS — I10 Essential (primary) hypertension: Secondary | ICD-10-CM | POA: Diagnosis not present

## 2018-05-10 DIAGNOSIS — Z8546 Personal history of malignant neoplasm of prostate: Secondary | ICD-10-CM | POA: Diagnosis not present

## 2018-05-10 DIAGNOSIS — G20 Parkinson's disease: Secondary | ICD-10-CM | POA: Diagnosis not present

## 2018-05-10 NOTE — Progress Notes (Addendum)
Roger Rodriguez, Roger Rodriguez ([id number]) Visit Report for 05/09/2018 Abuse/Suicide Risk Screen Details Patient Name: Roger Rodriguez, Roger Rodriguez Date of Service: 05/09/2018 8:45 AM Medical Record Number: [id number] Patient Account Number: [id number] Date of Birth/Sex: 10/26/1940 (77 y.o. M) Treating RN: Roger Rodriguez Primary Care Roger Rodriguez: Cranford Mon, Roger Rodriguez Other Clinician: Referring Sylvania Rodriguez: Roger Rodriguez Treating Roger Rodriguez/Extender: Roger Rodriguez: 0 Abuse/Suicide Risk Screen Items Answer ABUSE/SUICIDE RISK SCREEN: Has anyone close to you tried to hurt or harm you recentlyo No Do you feel uncomfortable with anyone in your familyo No Has anyone forced you do things that you didnot want to doo No Do you have any thoughts of harming yourselfo No Patient displays signs or symptoms of abuse and/or neglect. No Electronic Signature(s) Signed: 05/09/2018 8:38:34 AM By: Roger Rodriguez Entered By: Roger Rodriguez on 05/09/2018 08:38:33 Roger Rodriguez ([id number]) -------------------------------------------------------------------------------- Activities of Daily Living Details Patient Name: Roger Rodriguez Date of Service: 05/09/2018 8:45 AM Medical Record Number: [id number] Patient Account Number: [id number] Date of Birth/Sex: [birth year] (77 y.o. M) Treating RN: Roger Rodriguez Primary Care Liahna Brickner: Cranford Mon, Roger Rodriguez Other Clinician: Referring Roger Rodriguez: Roger Rodriguez Treating Roger Rodriguez/Extender: Roger Rodriguez: 0 Activities of Daily Living Items Answer Activities of Daily Living (Please select one for each item) Drive Automobile Not Able Take Medications Need Assistance Use Telephone Completely [place] for Appearance Need Assistance Use Toilet Need Assistance Bath / Shower Need Assistance Dress Self Need Assistance Feed Self Need Assistance Walk Not Able Get In / Out Bed Need Assistance Housework Need Assistance Prepare Meals Need Assistance Handle Money  Need Assistance Shop for Self Need Assistance Electronic Signature(s) Signed: 05/09/2018 5:02:27 PM By: Roger Rodriguez Entered By: Roger Rodriguez on 05/09/2018 09:02:21 Roger Rodriguez ([id number]) -------------------------------------------------------------------------------- Education Assessment Details Patient Name: Roger Rodriguez Date of Service: 05/09/2018 8:45 AM Medical Record Number: [id number] Patient Account Number: [id number] Date of Birth/Sex: [birth year] (77 y.o. M) Treating RN: Roger Rodriguez Primary Care Annalisa Colonna: Cranford Mon, Roger Rodriguez Other Clinician: Referring Roger Rodriguez: Roger Rodriguez Treating Aanyah Loa/Extender: Roger Rodriguez in Rodriguez: 0 Primary Learner Assessed: Patient Learning Preferences/Education Level/Primary Language Learning Preference: Explanation, Demonstration Highest Education Level: College or Above Preferred Language: English Cognitive Barrier Assessment/Beliefs Language Barrier: No Translator Needed: No Memory Deficit: No Emotional Barrier: No Cultural/Religious Beliefs Affecting Medical Care: No Physical Barrier Assessment Impaired Vision: No Impaired Hearing: No Decreased Hand dexterity: No Knowledge/Comprehension Assessment Knowledge Level: Medium Comprehension Level: Medium Ability to understand written Medium instructions: Ability to understand verbal Medium instructions: Motivation Assessment Anxiety Level: Calm Cooperation: Cooperative Education Importance: Acknowledges Need Interest in Health Problems: Asks Questions Perception: Coherent Willingness to Engage in Self- Medium Management Activities: Readiness to Engage in Self- Medium Management Activities: Electronic Signature(s) Signed: 05/09/2018 8:39:04 AM By: Roger Rodriguez Entered By: Roger Rodriguez on 05/09/2018 08:39:03 Roger Rodriguez ([id number]) -------------------------------------------------------------------------------- Fall Risk Assessment  Details Patient Name: Roger Rodriguez Date of Service: 05/09/2018 8:45 AM Medical Record Number: [id number] Patient Account Number: [id number] Date of Birth/Sex: 08/07/1940 (77 y.o. M) Treating RN: Roger Rodriguez Primary Care Kaylen Motl: Cranford Mon, Roger Rodriguez Other Clinician: Referring Roger Rodriguez: Roger Rodriguez Treating Roger Rodriguez/Extender: Roger Rodriguez: 0 Fall Risk Assessment Items Have you had 2 or more falls in the last 12 monthso 0 No Have you had any fall that resulted in injury in the last 12 monthso 0 No FALL RISK ASSESSMENT: History of falling - immediate or within 3 months 0 No Secondary diagnosis 0 No Ambulatory aid None/bed rest/wheelchair/nurse 0 Yes  Crutches/cane/walker 0 No Furniture 0 No IV Access/Saline Lock 0 No Gait/Training Normal/bed rest/immobile 0 No Weak 10 Yes Impaired 20 Yes Mental Status Oriented to own ability 0 Yes Electronic Signature(s) Signed: 05/09/2018 8:39:18 AM By: Roger Rodriguez Entered By: Roger Rodriguez on 05/09/2018 08:39:17 Roger Rodriguez ([id number]) -------------------------------------------------------------------------------- Foot Assessment Details Patient Name: Roger Rodriguez Date of Service: 05/09/2018 8:45 AM Medical Record Number: [id number] Patient Account Number: [id number] Date of Birth/Sex: 1940-12-05 (77 y.o. M) Treating RN: Roger Rodriguez Primary Care Dina Warbington: Cranford Mon, Roger Rodriguez Other Clinician: Referring [name]: Roger Rodriguez Treating Leylani Duley/Extender: Roger Rodriguez: 0 Foot Assessment Items Site Locations + = Sensation present, - = Sensation absent, C = Callus, U = Ulcer R = Redness, W = Warmth, M = Maceration, PU = Pre-ulcerative lesion F = Fissure, S = Swelling, D = Dryness Assessment Right: Left: Other Deformity: No No Prior Foot Ulcer: No No Prior Amputation: No No Charcot Joint: No No Ambulatory Status: Non-ambulatory Assistance Device: Wheelchair Gait:  Steady Electronic Signature(s) Signed: 05/09/2018 5:02:27 PM By: Roger Rodriguez Entered By: Roger Rodriguez on 05/09/2018 09:03:35 Roger Rodriguez ([id number]) -------------------------------------------------------------------------------- Nutrition Risk Assessment Details Patient Name: Roger Rodriguez Date of Service: 05/09/2018 8:45 AM Medical Record Number: [id number] Patient Account Number: [id number] Date of Birth/Sex: [birth year] (77 y.o. M) Treating RN: Roger Rodriguez Primary Care Dorothy Landgrebe: Cranford Mon, Roger Rodriguez Other Clinician: Referring Eufemio [name]: Roger Rodriguez Treating Sehaj Kolden/Extender: Roger Rodriguez: 0 Height (in): 71 Weight (lbs): 212 Body Mass Index (BMI): 29.6 Nutrition Risk Assessment Items NUTRITION RISK SCREEN: I have an illness or condition that made me change the kind and/or amount of 0 No food I eat I eat fewer than two meals per day 0 No I eat few fruits and vegetables, or milk products 0 No I have three or more drinks of beer, liquor or wine almost every day 0 No I have tooth or mouth problems that make it hard for me to eat 0 No I don't always have enough money to buy the food I need 0 No I eat alone most of the time 0 No I take three or more different prescribed or over-the-counter drugs a day 1 Yes Without wanting to, I have lost or gained 10 pounds in the last six months 0 No I am not always physically able to shop, cook and/or feed myself 0 No Nutrition Protocols Good Risk Protocol 0 No interventions needed Moderate Risk Protocol Electronic Signature(s) Signed: 05/09/2018 8:39:40 AM By: Roger Rodriguez Entered By: Roger Rodriguez on 05/09/2018 08:39:40

## 2018-05-11 ENCOUNTER — Other Ambulatory Visit: Payer: Self-pay | Admitting: *Deleted

## 2018-05-11 NOTE — Patient Outreach (Signed)
[place] [facility]) Care Management  05/11/2018  DONALDO TEEGARDEN 1941-03-28 [id number]  Attended interdisciplinary team discharge meeting at [facility] with [facility] UM team member Marlowe Kays. PT stating patient using a slide board for transfers to w/c.  PT states patient is planning on working on transfers to car, but foresees patient will need wheelchair transport to appointments.  SW states patient has hospital bed, slide board and wheelchair at home.  Discharge planner states patient has appealed [facility] with discharge date of 05/12/18.   Marlowe Kays with UM team to collaborate and make this CM aware of appeal status and plan to stay custodial or d/c to home.   [name] RN, BSN [place] Acute Care Coordinator [phone]) Business Mobile [phone]) Toll free office

## 2018-05-14 NOTE — Progress Notes (Signed)
CUNG, MASTERSON ([id number]) Visit Report for 05/09/2018 Chief Complaint Document Details Patient Name: Roger Rodriguez, Roger Rodriguez Date of Service: 05/09/2018 8:45 AM Medical Record Number: [id number] Patient Account Number: [id number] Date of Birth/Sex: 1941-05-09 (77 y.o. M) Treating RN: Cornell Barman Primary Care Provider: Cranford Mon, Delfino Lovett Other Clinician: Referring Provider: Kurtis Bushman Treating Provider/Extender: Melburn Hake, HOYT Weeks in Treatment: 0 Information Obtained from: Patient Chief Complaint Right knee abscess/infection due to hardware infection with residual ulcer right lateral knee Electronic Signature(s) Signed: 05/13/2018 9:31:16 AM By: Worthy Keeler PA-C Entered By: Worthy Keeler on 05/10/2018 12:37:37 Renteria, [name] ([id number]) -------------------------------------------------------------------------------- HPI Details Patient Name: Roger Rodriguez Date of Service: 05/09/2018 8:45 AM Medical Record Number: [id number] Patient Account Number: [id number] Date of Birth/Sex: [birth year] (77 y.o. M) Treating RN: Cornell Barman Primary Care Provider: Cranford Mon, Delfino Lovett Other Clinician: Referring Provider: Kurtis Bushman Treating Provider/Extender: Melburn Hake, HOYT Weeks in Treatment: 0 History of Present Illness HPI Description: 03/11/18 on evaluation today patient presents for initial inspection our clinic concerning what was stated to be a heel ulcer on the left. With that being said that is completely closed at this point there does not appear to be any evidence of issues currently. Nonetheless the bigger issue that I see upon evaluation today is that he has a significant area of erythema of the right knee unfortunately with a fairly large flush when abscess on the lateral portion of the knee. He did have a triple phase of bone scan performed yesterday which showed evidence of findings consistent with either a septic loosening or infection of the right knee prosthesis. With  this abscess present today I do believe this is likely due to infection. This was discussed with patient. No fevers, chills, nausea, or vomiting noted at this time. He has been having some discomfort but states that the pain is not terrible. When I push over the abscess area he did have a little bit of discomfort at this point. However he did not really have much in the way of sharp sensation in fact he did not even need me to numb it when I tested him with the scalpel he was not feeling the sharp sensation over the abscess area. He does have a history of Parkinson's disease, hypertension, chronic venous insufficiency, and having had a previous right knee replacement. 05/09/18 on evaluation today patient presents for reevaluation in our clinic regarding issues that he has been having with his right knee. I initially saw him on 03/11/18 for one visit where he had an abscess on the lateral portion of his right knee. Subsequently due to the fact that this probe down to bone I made a referral by way of call to [facility] here in [facility]. Subsequently the patient was advised to go to the ER to see the on call physician. This ended up being Dr. Kurtis Bushman who has been caring for him since that time. According to review the records it appears that the patient did have his right total knee removal and placement of an antibiotic spacer on 03/17/18. He is also been on IV antibiotic therapy which ended on 05/02/18. The plan at this point is that the patient needs to have the lateral wound on his right lower extremity completely healed prior to any reimplantation of the new prosthesis. The patient has a follow-up appointment with [facility] on 05/17/18 for aspiration of the right knee to be sent for Gram stain, culture, and sensitivity. Overall he was referred to  [place] here wound care to help with healing this lateral wound in order to be prepared and ready for the new knee replacement. I did  speak with Dr. Harlow Mares myself today 05/09/18 and he is releasing the patient's care to [place] as part of the postop global in order for [place] to aid in getting this wound to heal. Upon inspection today the wound actually appears to be fairly superficial and somewhat hyper granular but fortunately there does not appear to be any evidence of overt infection at this point in time. Electronic Signature(s) Signed: 05/13/2018 9:31:16 AM By: Worthy Keeler PA-C Entered By: Worthy Keeler on 05/13/2018 53:97:67 LAURA, RADILLA ([id number]) -------------------------------------------------------------------------------- Otelia Sergeant TISS Details Patient Name: Roger Rodriguez Date of Service: 05/09/2018 8:45 AM Medical Record Number: [id number] Patient Account Number: [id number] Date of Birth/Sex: 1940/08/24 (77 y.o. M) Treating RN: Montey Hora Primary Care Provider: Cranford Mon, Delfino Lovett Other Clinician: Referring Provider: Kurtis Bushman Treating Provider/Extender: Melburn Hake, HOYT Weeks in Treatment: 0 Procedure Performed for: Wound #2 Right,Lateral Knee Performed By: Physician Emilio Math., PA-C Post Procedure Diagnosis Same as Pre-procedure Electronic Signature(s) Signed: 05/09/2018 5:02:27 PM By: Montey Hora Entered By: Montey Hora on 05/09/2018 09:37:38 Cerveny, [name] ([id number]) -------------------------------------------------------------------------------- Physical Exam Details Patient Name: Roger Rodriguez Date of Service: 05/09/2018 8:45 AM Medical Record Number: [id number] Patient Account Number: [id number] Date of Birth/Sex: [birth year] (77 y.o. M) Treating RN: Cornell Barman Primary Care Provider: Cranford Mon, Delfino Lovett Other Clinician: Referring Provider: Kurtis Bushman Treating Provider/Extender: Melburn Hake, HOYT Weeks in Treatment: 0 Constitutional sitting or standing blood pressure is within target range for patient.. pulse regular and within target range for  patient.[name] respirations regular, non-labored and within target range for patient.[name] temperature within target range for patient.. Well- nourished and well-hydrated in no acute distress. Eyes conjunctiva clear no eyelid edema noted. pupils equal round and reactive to light and accommodation. Ears, Nose, Mouth, and Throat no gross abnormality of ear auricles or external auditory canals. normal hearing noted during conversation. mucus membranes moist. Respiratory normal breathing without difficulty. clear to auscultation bilaterally. Cardiovascular regular rate and rhythm with normal S1, S2. no clubbing, cyanosis, significant edema, <3 sec cap refill. Gastrointestinal (GI) soft, non-tender, non-distended, +BS. no ventral hernia noted. Musculoskeletal Patient unable to walk without assistance. Psychiatric this patient is able to make decisions and demonstrates good insight into disease process. Alert and Oriented x 3. pleasant and cooperative. Notes Patient's wound bed currently shows evidence of good granulation in fact more hyper granulation than anything at this point. Fortunately there does not appear to be any evidence of infection he seen with the sun here in the office at this point. He is in a wheelchair today during evaluation. With that being said apparently he has been at a skilled nursing facility doing some rehab at this point he is walking with assistance only again he has the anabiotic spacer and then the until such time as the total knee replacement can be inserted this is only going to be after everything is completely clear from infection standpoint. Fortunately the wound looks like this is something that we should be able to get to close fairly rapidly. Electronic Signature(s) Signed: 05/13/2018 9:31:16 AM By: Worthy Keeler PA-C Entered By: Worthy Keeler on 05/13/2018 09:29:32 NAKSH, RADI  ([id number]) -------------------------------------------------------------------------------- Physician Orders Details Patient Name: Roger Rodriguez Date of Service: 05/09/2018 8:45 AM Medical Record Number: [id number] Patient Account Number: [id number] Date of Birth/Sex: 04-18-1941 (77  y.o. M) Treating RN: Montey Hora Primary Care Provider: Cranford Mon, Delfino Lovett Other Clinician: Referring Provider: Kurtis Bushman Treating Provider/Extender: Melburn Hake, HOYT Weeks in Treatment: 0 Verbal / Phone Orders: No Diagnosis Coding Wound Cleansing Wound #2 Right,Lateral Knee o Cleanse wound with mild soap and water o May Shower, gently pat wound dry prior to applying new dressing. Primary Wound Dressing Wound #2 Right,Lateral Knee o Hydrafera Blue Ready Transfer - over contact layer o Other: - oil emulsion contact layer to wound bed Secondary Dressing Wound #2 Right,Lateral Knee o Boardered Foam Dressing - or other cover bandage Dressing Change Frequency Wound #2 Right,Lateral Knee o Change dressing every day. Follow-up Appointments Wound #2 Right,Lateral Knee o Return Appointment in 1 week. Electronic Signature(s) Signed: 05/09/2018 5:02:27 PM By: Montey Hora Signed: 05/13/2018 9:31:16 AM By: Worthy Keeler PA-C Entered By: Montey Hora on 05/09/2018 09:41:05 Rua, [name] ([id number]) -------------------------------------------------------------------------------- Problem List Details Patient Name: Roger Rodriguez Date of Service: 05/09/2018 8:45 AM Medical Record Number: [id number] Patient Account Number: [id number] Date of Birth/Sex: 1940/11/16 (77 y.o. M) Treating RN: Cornell Barman Primary Care Provider: Cranford Mon, Delfino Lovett Other Clinician: Referring Provider: Kurtis Bushman Treating Provider/Extender: Melburn Hake, HOYT Weeks in Treatment: 0 Active Problems ICD-10 Evaluated Encounter Code Description Active Date Today Diagnosis T84.53XA Infection and  inflammatory reaction due to internal right knee 05/10/2018 No Yes prosthesis, initial encounter L97.812 Non-pressure chronic ulcer of other part of right lower leg 05/10/2018 No Yes with fat layer exposed [place] Parkinson's disease 05/10/2018 No Yes I10 Essential (primary) hypertension 05/10/2018 No Yes I87.2 Venous insufficiency (chronic) (peripheral) 05/10/2018 No Yes Inactive Problems Resolved Problems Electronic Signature(s) Signed: 05/13/2018 9:31:16 AM By: Worthy Keeler PA-C Entered By: Worthy Keeler on 05/10/2018 12:37:13 Nordlund, [name] ([id number]) -------------------------------------------------------------------------------- Progress Note Details Patient Name: Roger Rodriguez Date of Service: 05/09/2018 8:45 AM Medical Record Number: [id number] Patient Account Number: [id number] Date of Birth/Sex: 15-Jan-1941 (77 y.o. M) Treating RN: Cornell Barman Primary Care Provider: Cranford Mon, Delfino Lovett Other Clinician: Referring Provider: Kurtis Bushman Treating Provider/Extender: Melburn Hake, HOYT Weeks in Treatment: 0 Subjective Chief Complaint Information obtained from Patient Right knee abscess/infection due to hardware infection with residual ulcer right lateral knee History of Present Illness (HPI) 03/11/18 on evaluation today patient presents for initial inspection our clinic concerning what was stated to be a heel ulcer on the left. With that being said that is completely closed at this point there does not appear to be any evidence of issues currently. Nonetheless the bigger issue that I see upon evaluation today is that he has a significant area of erythema of the right knee unfortunately with a fairly large flush when abscess on the lateral portion of the knee. He did have a triple phase of bone scan performed yesterday which showed evidence of findings consistent with either a septic loosening or infection of the right knee prosthesis. With this abscess present today I do believe  this is likely due to infection. This was discussed with patient. No fevers, chills, nausea, or vomiting noted at this time. He has been having some discomfort but states that the pain is not terrible. When I push over the abscess area he did have a little bit of discomfort at this point. However he did not really have much in the way of sharp sensation in fact he did not even need me to numb it when I tested him with the scalpel he was not feeling the sharp sensation over the abscess area. He  does have a history of Parkinson's disease, hypertension, chronic venous insufficiency, and having had a previous right knee replacement. 05/09/18 on evaluation today patient presents for reevaluation in our clinic regarding issues that he has been having with his right knee. I initially saw him on 03/11/18 for one visit where he had an abscess on the lateral portion of his right knee. Subsequently due to the fact that this probe down to bone I made a referral by way of call to [facility] here in [facility]. Subsequently the patient was advised to go to the ER to see the on call physician. This ended up being Dr. Kurtis Bushman who has been caring for him since that time. According to review the records it appears that the patient did have his right total knee removal and placement of an antibiotic spacer on 03/17/18. He is also been on IV antibiotic therapy which ended on 05/02/18. The plan at this point is that the patient needs to have the lateral wound on his right lower extremity completely healed prior to any reimplantation of the new prosthesis. The patient has a follow-up appointment with [facility] on 05/17/18 for aspiration of the right knee to be sent for Gram stain, culture, and sensitivity. Overall he was referred to [place] here wound care to help with healing this lateral wound in order to be prepared and ready for the new knee replacement. I did speak with Dr. Harlow Mares myself today 05/09/18  and he is releasing the patient's care to [place] as part of the postop global in order for [place] to aid in getting this wound to heal. Upon inspection today the wound actually appears to be fairly superficial and somewhat hyper granular but fortunately there does not appear to be any evidence of overt infection at this point in time. Wound History Patient presents with 1 open wound that has been present for approximately 7 weeks. Patient has been treating wound in the following manner: unknown. Laboratory tests have not been performed in the last month. Patient reportedly has not tested positive for an antibiotic resistant organism. Patient reportedly has not tested positive for osteomyelitis. Patient reportedly has not had testing performed to evaluate circulation in the legs. Patient experiences the following problems associated with their wounds: infection. Patient History Information obtained from Patient. Allergies Sulfa (Sulfonamide Antibiotics) NAPOLEON, MONACELLI ([id number]) Family History Cancer - Siblings, Heart Disease - Mother, Stroke - Father, No family history of Diabetes, Hypertension, Kidney Disease, Lung Disease, Seizures, Thyroid Problems, Tuberculosis. Social History Never smoker, Marital Status - Widowed, Alcohol Use - Never, Drug Use - No History, Caffeine Use - Moderate. Medical History Respiratory Denies history of Tuberculosis Genitourinary Denies history of End Stage Renal Disease Musculoskeletal Patient has history of Osteoarthritis Denies history of Rheumatoid Arthritis, Osteomyelitis Review of Systems (ROS) Eyes Denies complaints or symptoms of Dry Eyes, Vision Changes, Glasses / Contacts. Ear/Nose/Mouth/Throat Denies complaints or symptoms of Difficult clearing ears, Sinusitis. Hematologic/Lymphatic Denies complaints or symptoms of Bleeding / Clotting Disorders, Human Immunodeficiency Virus. Respiratory Denies complaints or symptoms of Chronic or frequent  coughs, Shortness of Breath. Cardiovascular Denies complaints or symptoms of Chest pain, LE edema. Gastrointestinal Denies complaints or symptoms of Frequent diarrhea, Nausea, Vomiting. Endocrine Denies complaints or symptoms of Hepatitis, Thyroid disease, Polydypsia (Excessive Thirst). Genitourinary Complains or has symptoms of Incontinence/dribbling. Denies complaints or symptoms of Kidney failure/ Dialysis. Immunological Denies complaints or symptoms of Hives, Itching. Integumentary (Skin) Denies complaints or symptoms of Wounds, Bleeding or bruising tendency, Breakdown, Swelling.  Musculoskeletal Denies complaints or symptoms of Muscle Pain, Muscle Weakness. Neurologic Denies complaints or symptoms of Numbness/parasthesias, Focal/Weakness. Psychiatric Denies complaints or symptoms of Anxiety, Claustrophobia. Objective Constitutional sitting or standing blood pressure is within target range for patient.. pulse regular and within target range for patient.[name] respirations regular, non-labored and within target range for patient.[name] temperature within target range for patient.. Well- SUREN, PAYNE. ([id number]) nourished and well-hydrated in no acute distress. Vitals Time Taken: 8:45 AM, Height: 71 in, Source: Stated, Weight: 212 lbs, Source: Stated, BMI: 29.6, Temperature: 98.2 F, Pulse: 80 bpm, Respiratory Rate: 16 breaths/min, Blood Pressure: 118/57 mmHg. Eyes conjunctiva clear no eyelid edema noted. pupils equal round and reactive to light and accommodation. Ears, Nose, Mouth, and Throat no gross abnormality of ear auricles or external auditory canals. normal hearing noted during conversation. mucus membranes moist. Respiratory normal breathing without difficulty. clear to auscultation bilaterally. Cardiovascular regular rate and rhythm with normal S1, S2. no clubbing, cyanosis, significant edema, Gastrointestinal (GI) soft, non-tender, non-distended, +BS. no ventral hernia  noted. Musculoskeletal Patient unable to walk without assistance. Psychiatric this patient is able to make decisions and demonstrates good insight into disease process. Alert and Oriented x 3. pleasant and cooperative. General Notes: Patient's wound bed currently shows evidence of good granulation in fact more hyper granulation than anything at this point. Fortunately there does not appear to be any evidence of infection he seen with the sun here in the office at this point. He is in a wheelchair today during evaluation. With that being said apparently he has been at a skilled nursing facility doing some rehab at this point he is walking with assistance only again he has the anabiotic spacer and then the until such time as the total knee replacement can be inserted this is only going to be after everything is completely clear from infection standpoint. Fortunately the wound looks like this is something that we should be able to get to close fairly rapidly. Integumentary (Hair, Skin) Wound #2 status is Open. Original cause of wound was Surgical Injury. The wound is located on the Right,Lateral Knee. The wound measures 0.9cm length x 0.9cm width x 0.1cm depth; 0.636cm^2 area and 0.064cm^3 volume. There is Fat Layer (Subcutaneous Tissue) Exposed exposed. There is no tunneling or undermining noted. There is a medium amount of sanguinous drainage noted. The wound margin is flat and intact. There is large (67-100%) hyper - granulation within the wound bed. There is a small (1-33%) amount of necrotic tissue within the wound bed including Adherent Slough. The periwound skin appearance exhibited: Scarring. The periwound skin appearance did not exhibit: Callus, Crepitus, Excoriation, Induration, Rash, Dry/Scaly, Maceration, Atrophie Blanche, Cyanosis, Ecchymosis, Hemosiderin Staining, Mottled, Pallor, Rubor, Erythema. Periwound temperature was noted as No Abnormality. Assessment Active  Problems ICD-10 Infection and inflammatory reaction due to internal right knee prosthesis, initial encounter Non-pressure chronic ulcer of other part of right lower leg with fat layer exposed Parkinson's disease Essential (primary) hypertension Chestnut, Jaret E. ([id number]) Venous insufficiency (chronic) (peripheral) Procedures Wound #2 Pre-procedure diagnosis of Wound #2 is an Open Surgical Wound located on the Right,Lateral Knee . An CHEM CAUT GRANULATION TISS procedure was performed by STONE III, HOYT E., PA-C. Post procedure Diagnosis Wound #2: Same as Pre-Procedure Plan Wound Cleansing: Wound #2 Right,Lateral Knee: Cleanse wound with mild soap and water May Shower, gently pat wound dry prior to applying new dressing. Primary Wound Dressing: Wound #2 Right,Lateral Knee: Hydrafera Blue Ready Transfer - over contact layer Other: - oil  emulsion contact layer to wound bed Secondary Dressing: Wound #2 Right,Lateral Knee: Boardered Foam Dressing - or other cover bandage Dressing Change Frequency: Wound #2 Right,Lateral Knee: Change dressing every day. Follow-up Appointments: Wound #2 Right,Lateral Knee: Return Appointment in 1 week. At this point I'm gonna suggest that we initiate the above wound care measures for the next week. The patient is in agreement with plan as is the sign. I'm hopeful this will do much better for him as far as getting the wound to completely seal and close. I did perform chemical cauterization plaque granular tissue analyzing a single silver nitrate stick in regard to the wound surface at this point. Subsequently will use the [facility] Dressing dressing which is also beneficial for hyper granular tissue to aid with the healing as well. This should be completely saturated with saline prior to remove them to ensure nothing is sticking to the wound bed. We will subsequently see were things stand at follow-up. We are using a contact layer underneath  the Hydrofera Blue Dressing we will subsequently see were things stand at follow-up. Please see above for specific wound care orders. We will see patient for re-evaluation in 1 week(s) here in the clinic. If anything worsens or changes patient will contact our office for additional recommendations. Electronic Signature(s) Signed: 05/13/2018 9:31:16 AM By: Worthy Keeler PA-C Entered By: Worthy Keeler on 05/13/2018 09:30:53 Quigg, EDIBERTO SENS ([id number]) BALRAJ, BRAYFIELD ([id number]) -------------------------------------------------------------------------------- ROS/PFSH Details Patient Name: Roger Rodriguez Date of Service: 05/09/2018 8:45 AM Medical Record Number: [id number] Patient Account Number: [id number] Date of Birth/Sex: Nov 26, 1940 (77 y.o. M) Treating RN: Montey Hora Primary Care Provider: Cranford Mon, Delfino Lovett Other Clinician: Referring Provider: Kurtis Bushman Treating Provider/Extender: Melburn Hake, HOYT Weeks in Treatment: 0 Information Obtained From Patient Wound History Do you currently have one or more open woundso Yes How many open wounds do you currently haveo 1 Approximately how long have you had your woundso 7 weeks How have you been treating your wound(s) until nowo unknown Has your wound(s) ever healed and then re-openedo No Have you had any lab work done in the past montho No Have you tested positive for an antibiotic resistant organism (MRSA, VRE)o No Have you tested positive for osteomyelitis (bone infection)o No Have you had any tests for circulation on your legso No Have you had other problems associated with your woundso Infection Eyes Complaints and Symptoms: Negative for: Dry Eyes; Vision Changes; Glasses / Contacts Medical History: Positive for: Cataracts - removed bilateral Negative for: Glaucoma; Optic Neuritis Ear/Nose/Mouth/Throat Complaints and Symptoms: Negative for: Difficult clearing ears; Sinusitis Medical History: Positive for: Chronic  sinus problems/congestion Hematologic/Lymphatic Complaints and Symptoms: Negative for: Bleeding / Clotting Disorders; Human Immunodeficiency Virus Medical History: Positive for: Lymphedema Negative for: Anemia; Hemophilia; Human Immunodeficiency Virus; Sickle Cell Disease Respiratory Complaints and Symptoms: Negative for: Chronic or frequent coughs; Shortness of Breath Medical History: Negative for: Aspiration; Asthma; Chronic Obstructive Pulmonary Disease (COPD); Pneumothorax; Sleep Apnea; Tuberculosis CHIKE, FARRINGTON. ([id number]) Cardiovascular Complaints and Symptoms: Negative for: Chest pain; LE edema Medical History: Positive for: Hypertension Negative for: Angina; Arrhythmia; Congestive Heart Failure; Coronary Artery Disease; Deep Vein Thrombosis; Hypotension; Myocardial Infarction; Peripheral Arterial Disease; Peripheral Venous Disease; Phlebitis; Vasculitis Gastrointestinal Complaints and Symptoms: Negative for: Frequent diarrhea; Nausea; Vomiting Medical History: Negative for: Cirrhosis ; Colitis; Crohnos; Hepatitis A; Hepatitis B; Hepatitis C Endocrine Complaints and Symptoms: Negative for: Hepatitis; Thyroid disease; Polydypsia (Excessive Thirst) Medical History: Negative for: Type I Diabetes; Type II Diabetes Genitourinary  Complaints and Symptoms: Positive for: Incontinence/dribbling Negative for: Kidney failure/ Dialysis Medical History: Negative for: End Stage Renal Disease Immunological Complaints and Symptoms: Negative for: Hives; Itching Medical History: Negative for: Lupus Erythematosus; Raynaudos Integumentary (Skin) Complaints and Symptoms: Negative for: Wounds; Bleeding or bruising tendency; Breakdown; Swelling Medical History: Negative for: History of Burn; History of pressure wounds Musculoskeletal Complaints and Symptoms: Negative for: Muscle Pain; Muscle Weakness Medical History: Positive for: Osteoarthritis Negative for: Gout; Rheumatoid  Arthritis; Osteomyelitis Ziemann, David E. ([id number]) Neurologic Complaints and Symptoms: Negative for: Numbness/parasthesias; Focal/Weakness Medical History: Negative for: Dementia; Neuropathy; Quadriplegia; Paraplegia; Seizure Disorder Psychiatric Complaints and Symptoms: Negative for: Anxiety; Claustrophobia Medical History: Negative for: Anorexia/bulimia; Confinement Anxiety Oncologic Medical History: Negative for: Received Chemotherapy; Received Radiation HBO Extended History Items Eyes: Ear/Nose/Mouth/Throat: Cataracts Chronic sinus problems/congestion Immunizations Pneumococcal Vaccine: Received Pneumococcal Vaccination: Yes Implantable Devices Family and Social History Cancer: Yes - Siblings; Diabetes: No; Heart Disease: Yes - Mother; Hypertension: No; Kidney Disease: No; Lung Disease: No; Seizures: No; Stroke: Yes - Father; Thyroid Problems: No; Tuberculosis: No; Never smoker; Marital Status - Widowed; Alcohol Use: Never; Drug Use: No History; Caffeine Use: Moderate; Financial Concerns: No; Food, Clothing or Shelter Needs: No; Support System Lacking: No; Transportation Concerns: No; Advanced Directives: Yes (Not Provided); Patient does not want information on Advanced Directives; Do not resuscitate: No; Living Will: Yes (Not Provided); Medical Power of Attorney: Yes (Not Provided) Electronic Signature(s) Signed: 05/09/2018 5:02:27 PM By: Montey Hora Signed: 05/13/2018 9:31:16 AM By: Worthy Keeler PA-C Entered By: Montey Hora on 05/09/2018 08:56:32 Hepner, [name] ([id number]) -------------------------------------------------------------------------------- [facility] Details Patient Name: Roger Rodriguez Date of Service: 05/09/2018 Medical Record Number: [id number] Patient Account Number: [id number] Date of Birth/Sex: 1940-12-01 (77 y.o. 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 Parkinson's disease I10 Essential (primary) hypertension I87.2 Venous insufficiency (chronic) (peripheral) Facility Procedures CPT4 Code Description: 63893734 99213 - WOUND CARE VISIT-LEV 3 EST PT Modifier: Quantity: 1 CPT4 Code Description: [id number] 17250 - CHEM CAUT GRANULATION TISS ICD-10 Diagnosis Description B26.203 Non-pressure chronic ulcer of other part of right lower leg with Modifier: fat layer expo Quantity: 1 sed Physician Procedures CPT4 Code Description: [id number] 38453 - WC PHYS LEVEL 4 - EST PT ICD-10 Diagnosis Description T84.53XA Infection and inflammatory reaction due to internal right knee pr L97.812 Non-pressure chronic ulcer of other part of right lower leg with G20  Parkinson's disease I10 Essential (primary) hypertension Modifier: 25 osthesis, initia fat layer expose Quantity: 1 l encounter d CPT4 Code Description: [id number] 12248 - WC PHYS CHEM CAUT GRAN TISSUE ICD-10 Diagnosis Description G50.037 Non-pressure chronic ulcer of other part of right lower leg with Modifier: fat layer expose Quantity: 1 d Electronic Signature(s) Signed: 05/13/2018 9:31:16 AM By: Worthy Keeler PA-C Entered By: Worthy Keeler on 05/10/2018 12:38:06

## 2018-05-16 ENCOUNTER — Encounter: Payer: PPO | Attending: Physician Assistant | Admitting: Physician Assistant

## 2018-05-16 DIAGNOSIS — L97812 Non-pressure chronic ulcer of other part of right lower leg with fat layer exposed: Secondary | ICD-10-CM | POA: Diagnosis not present

## 2018-05-16 DIAGNOSIS — I872 Venous insufficiency (chronic) (peripheral): Secondary | ICD-10-CM | POA: Diagnosis not present

## 2018-05-16 DIAGNOSIS — G20 Parkinson's disease: Secondary | ICD-10-CM | POA: Diagnosis not present

## 2018-05-16 DIAGNOSIS — T8189XA Other complications of procedures, not elsewhere classified, initial encounter: Secondary | ICD-10-CM | POA: Diagnosis not present

## 2018-05-16 DIAGNOSIS — I10 Essential (primary) hypertension: Secondary | ICD-10-CM | POA: Diagnosis not present

## 2018-05-17 ENCOUNTER — Other Ambulatory Visit: Payer: Self-pay | Admitting: *Deleted

## 2018-05-17 NOTE — Patient Outreach (Signed)
[facility] ([facility]) Care Management  05/17/2018  Roger Rodriguez 07/30/1940 [id number]   Attended IDT discharge meeting at [facility].  PT states patient plans to discharge to home on Thursday 05/19/18.  PT stating patient has plans to complete car transfer training prior to d/c. PT states patient is continuing to use a slide board for transfers. SW states patient has 24 hour at home care givers, who patient identifies as certified nurse techs. SW states patient attended MD appointment to have fluid drained and cultured from Right knee.   Met with patient at the bedside with [facility] liaison Lori Dodson and [employer] student nurse Aaron.  Patient confirms discharge plan is Thursday of this week.  Patient confirmed he has several pieces of equipment including a hoyer lift being delivered to his home.  Patient stated he went to MD office today to have fluid aspirated from knee and has to return tomorrow to have this procedure again. Patient states MD plans to culture aspirate prior to scheduling knee replacement surgery.  Patient states knee replacement tentatively scheduled for 06/08/18.  Patient with a dressing noted to the side of Right knee where patient explains wound vac had been present. Re-explained [facility] Care Management services to patient and patient agreeable to services. Patient gave [phone](H) and [phone](C) as best contact numbers for him. Patient will receive [facility] through [facility], Lori confirms patient will receive HH PT.   Referral sent for [facility] RN Community Care Nurse to engage patient in transition of care at SNF discharge. Plan to make [facility] UM team aware of patient referral to CM.     RN, BSN [facility]  Post Acute Care Coordinator ([phone]) Business Mobile ([phone]) Toll free office  

## 2018-05-18 ENCOUNTER — Other Ambulatory Visit
Admission: RE | Admit: 2018-05-18 | Discharge: 2018-05-18 | Disposition: A | Discharge status: Home / Self Care | Payer: PPO | Source: Ambulatory Visit | Attending: Orthopedic Surgery | Admitting: Orthopedic Surgery

## 2018-05-18 ENCOUNTER — Ambulatory Visit: Payer: Self-pay | Admitting: Orthopedic Surgery

## 2018-05-18 DIAGNOSIS — Z01818 Encounter for other preprocedural examination: Secondary | ICD-10-CM | POA: Diagnosis not present

## 2018-05-18 DIAGNOSIS — T8453XD Infection and inflammatory reaction due to internal right knee prosthesis, subsequent encounter: Secondary | ICD-10-CM | POA: Insufficient documentation

## 2018-05-18 LAB — SYNOVIAL CELL COUNT + DIFF, W/ CRYSTALS
Crystals, Fluid: NONE SEEN
Eosinophils-Synovial: 5 %
Lymphocytes-Synovial Fld: 21 %
Monocyte-Macrophage-Synovial Fluid: 37 %
Neutrophil, Synovial: 37 %
WBC, Synovial: 637 /mm3 — ABNORMAL HIGH (ref 0–200)

## 2018-05-20 ENCOUNTER — Other Ambulatory Visit: Payer: Self-pay | Admitting: *Deleted

## 2018-05-20 NOTE — Patient Outreach (Signed)
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  Insurance : HTA  PHX significant for Hypertension , parkinson disease, prostate CA, Right total knee arthroplasty.   Unsuccessful call attempt to patient on home and mobile line, no answer able to leave a HIPAA compliant message for return call.     Incoming call from patient , Explained reason for the call and [name] care management services, patient recall [facility] representative speaking with him at Rehab regarding follow up after discharge patient is agreeable. Patient states he wanted to come home a little while prior to upcoming surgery on 11/27 to right knee placement.   Patient discussed that he has 24 hour care at home again. Patient discussed tolerating being out of bed to wheelchair at present and been able to transfer on and off commode with assistance. Patient reports just having a bandaid to right knee.   Patient unable to review current medication list at this time , states it is the same as last time we talked, he  states he is taking medications as prescribed.   Patient states that he is just getting settled into being at home after being away about 6 weeks ,  at present and doesn't feel like talking at this time. He is agreeable to follow up call in the next week, explained and agreeable to [facility] follow up.   Plan Provided patient with contact number, [facility] care manager. Will plan return call in the week for transition of care follow up and continued completion of assessment and goal setting .  Will send PCP involvement barrier letter.    [facility] CM Care Plan Problem One     Most Recent Value  Care Plan Problem One  High risk for  readmission related to recent hosptial admission and discharge from SNF for rehab after left knee revisison and placement of antibiotic spacer.   Role Documenting the Problem One  Care Management Coordinator  Care Plan for Problem One  Active  [facility] Long Term Goal   Patient will not experience an unplanned admission over the next 31 days as evidenced by EMR   [facility] Long Term Goal Start Date  05/20/18  Interventions for Problem One Long Term Goal  Advised regarding taking medications as prescribed, notiyfing MD sooner for new concerns for signs of infection, redness, temperatures drainage at right knee areas.   [facility] CM Short Term Goal #1   Patient will report attending all medical appointments over the next 14 days   [facility] CM Short Term Goal #1 Start Date  05/20/18  Interventions for Short Term Goal #1  Advised regarding attending all appointment , for follow up after discharge , review of medications   [facility] CM Short Term Goal #2   Patient will be able to report being active with home health therapy over the next 14 days   [facility] CM Short Term Goal #2 Start Date  05/20/18  Interventions for Short Term Goal #2  Discussed with patient [facility] agency , to contact him within 48 hrs after discharge, confirmed agency to follow.          [name], RN, [place] Management Coordinator  [phone]- Mobile [phone]- Toll Free Main  Office

## 2018-05-21 NOTE — Progress Notes (Signed)
Roger Rodriguez, Roger Rodriguez ([id number]) Visit Report for 05/16/2018 Chief Complaint Document Details Patient Name: Roger Rodriguez, Roger Rodriguez Date of Service: 05/16/2018 9:30 AM Medical Record Number: [id number] Patient Account Number: [id number] Date of Birth/Sex: 07-19-1940 (77 y.o. M) Treating RN: Roger Rodriguez Primary Care Provider: Cranford Rodriguez, Roger Rodriguez Other Clinician: Referring Provider: Wilhemena Rodriguez Treating Provider/Extender: Roger Rodriguez, Roger Rodriguez Weeks in Treatment: 1 Information Obtained from: Patient Chief Complaint Right knee abscess/infection due to hardware infection with residual ulcer right lateral knee Electronic Signature(s) Signed: 05/17/2018 11:39:33 AM By: Roger Rodriguez Entered By: Roger Keeler on 05/16/2018 09:40:18 Rodriguez, Roger Bo ([id number]) -------------------------------------------------------------------------------- HPI Details Patient Name: Roger Rodriguez Date of Service: 05/16/2018 9:30 AM Medical Record Number: [id number] Patient Account Number: [id number] Date of Birth/Sex: [birth year] (77 y.o. M) Treating RN: Roger Rodriguez Primary Care Provider: Cranford Rodriguez, Roger Rodriguez Other Clinician: Referring Provider: Cranford Rodriguez, Roger Rodriguez Treating Provider/Extender: Roger Keeler Weeks in Treatment: 1 History of Present Illness HPI Description: 03/11/18 on evaluation today patient presents for initial inspection our clinic concerning what was stated to be a heel ulcer on the left. With that being said that is completely closed at this point there does not appear to be any evidence of issues currently. Nonetheless the bigger issue that I see upon evaluation today is that he has a significant area of erythema of the right knee unfortunately with a fairly large flush when abscess on the lateral portion of the knee. He did have a triple phase of bone scan performed yesterday which showed evidence of findings consistent with either a septic loosening or infection of the right knee  prosthesis. With this abscess present today I do believe this is likely due to infection. This was discussed with patient. No fevers, chills, nausea, or vomiting noted at this time. He has been having some discomfort but states that the pain is not terrible. When I push over the abscess area he did have a little bit of discomfort at this point. However he did not really have much in the way of sharp sensation in fact he did not even need me to numb it when I tested him with the scalpel he was not feeling the sharp sensation over the abscess area. He does have a history of Parkinson's disease, hypertension, chronic venous insufficiency, and having had a previous right knee replacement. 05/09/18 on evaluation today patient presents for reevaluation in our clinic regarding issues that he has been having with his right knee. I initially saw him on 03/11/18 for one visit where he had an abscess on the lateral portion of his right knee. Subsequently due to the fact that this probe down to bone I made a referral by way of call to [facility] here in [facility]. Subsequently the patient was advised to go to the ER to see the on call physician. This ended up being Dr. Kurtis Rodriguez who has been caring for him since that time. According to review the records it appears that the patient did have his right total knee removal and placement of an antibiotic spacer on 03/17/18. He is also been on IV antibiotic therapy which ended on 05/02/18. The plan at this point is that the patient needs to have the lateral wound on his right lower extremity completely healed prior to any reimplantation of the new prosthesis. The patient has a follow-up appointment with [facility] on 05/17/18 for aspiration of the right knee to be sent for Gram stain, culture, and sensitivity. Overall he was  referred to [place] here wound care to help with healing this lateral wound in order to be prepared and ready for the new  knee replacement. I did speak with Dr. Harlow Rodriguez myself today 05/09/18 and he is releasing the patient's care to [place] as part of the postop global in order for [place] to aid in getting this wound to heal. Upon inspection today the wound actually appears to be fairly superficial and somewhat hyper granular but fortunately there does not appear to be any evidence of overt infection at this point in time. 05/16/18 on evaluation today patient actually appears to be doing wonderful in regard to his right lateral ankle ulcer. He has been tolerating the dressing changes without complication. With that being said upon evaluation today it appears that the wound is completely healed. There was a small area on the inferior portion of the wound bed that when the eschar that was overlying was removed pulled back a small portion of the new skin causing it to bleed minimally. Nonetheless there was not anything significant at this point to be worried about as far as I was concerned. I am gonna likely see him back for one week follow-up just to ensure everything is fine but I truly believe it will be healed before then. Electronic Signature(s) Signed: 05/17/2018 11:39:33 AM By: Roger Rodriguez Entered By: Roger Keeler on 05/16/2018 18:10:44 [name], Roger Bo ([id number]) -------------------------------------------------------------------------------- Physical Exam Details Patient Name: Roger Rodriguez Date of Service: 05/16/2018 9:30 AM Medical Record Number: [id number] Patient Account Number: [id number] Date of Birth/Sex: 1941/06/20 (77 y.o. M) Treating RN: Roger Rodriguez Primary Care Provider: Cranford Rodriguez, Roger Rodriguez Other Clinician: Referring Provider: Cranford Rodriguez, Roger Rodriguez Treating Provider/Extender: Roger Rodriguez, Roger Rodriguez Weeks in Treatment: 1 Constitutional Well-nourished and well-hydrated in no acute distress. Respiratory normal breathing without difficulty. clear to auscultation bilaterally. Cardiovascular regular  rate and rhythm with normal S1, S2. Psychiatric this patient is able to make decisions and demonstrates good insight into disease process. Alert and Oriented x 3. pleasant and cooperative. Notes Upon inspection today the patient's wound bed actually shows evidence of good epithelialization which is great news. In fact there is new skin covering the entirety of the wound except for where it appears part of the eschar coming off might've pulled a little piece of the inferior portion of the new skin back. Nonetheless it stop bleeding within just seconds and this was a tiny region. For all intensive purposes this is completely healed. Electronic Signature(s) Signed: 05/17/2018 11:39:33 AM By: Roger Rodriguez Entered By: Roger Keeler on 05/16/2018 18:11:30 Hinton, Roger Bo ([id number]) -------------------------------------------------------------------------------- Physician Orders Details Patient Name: Roger Rodriguez Date of Service: 05/16/2018 9:30 AM Medical Record Number: [id number] Patient Account Number: [id number] Date of Birth/Sex: 08/30/1940 (77 y.o. M) Treating RN: Roger Rodriguez Primary Care Provider: Cranford Rodriguez, Roger Rodriguez Other Clinician: Referring Provider: Cranford Rodriguez, Roger Rodriguez Treating Provider/Extender: Roger Rodriguez, Maebel Marasco Weeks in Treatment: 1 Verbal / Phone Orders: No Diagnosis Coding ICD-10 Coding Code Description T84.53XD Infection and inflammatory reaction due to internal right knee prosthesis, subsequent encounter L97.812 Non-pressure chronic ulcer of other part of right lower leg with fat layer exposed G20 Parkinson's disease I10 Essential (primary) hypertension I87.2 Venous insufficiency (chronic) (peripheral) Wound Cleansing Wound #2 Right,Lateral Knee o Cleanse wound with mild soap and water o May Shower, gently pat wound dry prior to applying new dressing. Primary Wound Dressing Wound #2 Right,Lateral Knee o Boardered Foam Dressing Dressing Change  Frequency Wound #2 Right,Lateral Knee   o Other: - as needed Follow-up Appointments Wound #2 Right,Lateral Knee o Return Appointment in 1 week. - if needed Electronic Signature(s) Signed: 05/17/2018 11:39:33 AM By: Roger Rodriguez Signed: 05/19/2018 10:02:59 AM By: Gretta Cool, BSN, RN, CWS, Kim RN, BSN Entered By: Gretta Cool, BSN, RN, CWS, Kim on 05/16/2018 09:55:35 PATTY, LOPEZGARCIA ([id number]) -------------------------------------------------------------------------------- Problem List Details Patient Name: Roger Rodriguez Date of Service: 05/16/2018 9:30 AM Medical Record Number: [id number] Patient Account Number: [id number] Date of Birth/Sex: July 05, 1941 (77 y.o. M) Treating RN: Roger Rodriguez Primary Care Provider: Cranford Rodriguez, Roger Rodriguez Other Clinician: Referring Provider: Cranford Rodriguez, Roger Rodriguez Treating Provider/Extender: Sharalyn Ink in Treatment: 1 Active Problems ICD-10 Evaluated Encounter Code Description Active Date Today Diagnosis T84.53XD Infection and inflammatory reaction due to internal right knee 05/10/2018 No Yes prosthesis, subsequent encounter L97.812 Non-pressure chronic ulcer of other part of right lower leg 05/10/2018 No Yes with fat layer exposed G20 Parkinson's disease 05/10/2018 No Yes I10 Essential (primary) hypertension 05/10/2018 No Yes I87.2 Venous insufficiency (chronic) (peripheral) 05/10/2018 No Yes Inactive Problems Resolved Problems Electronic Signature(s) Signed: 05/17/2018 11:39:33 AM By: Roger Rodriguez Entered By: Roger Keeler on 05/16/2018 09:40:11 Futrell, Roger Bo ([id number]) -------------------------------------------------------------------------------- Progress Note Details Patient Name: Roger Rodriguez Date of Service: 05/16/2018 9:30 AM Medical Record Number: [id number] Patient Account Number: [id number] Date of Birth/Sex: October 21, 1940 (77 y.o. M) Treating RN: Roger Rodriguez Primary Care Provider: Cranford Rodriguez, Roger Rodriguez Other  Clinician: Referring Provider: Cranford Rodriguez, Roger Rodriguez Treating Provider/Extender: Sharalyn Ink in Treatment: 1 Subjective Chief Complaint Information obtained from Patient Right knee abscess/infection due to hardware infection with residual ulcer right lateral knee History of Present Illness (HPI) 03/11/18 on evaluation today patient presents for initial inspection our clinic concerning what was stated to be a heel ulcer on the left. With that being said that is completely closed at this point there does not appear to be any evidence of issues currently. Nonetheless the bigger issue that I see upon evaluation today is that he has a significant area of erythema of the right knee unfortunately with a fairly large flush when abscess on the lateral portion of the knee. He did have a triple phase of bone scan performed yesterday which showed evidence of findings consistent with either a septic loosening or infection of the right knee prosthesis. With this abscess present today I do believe this is likely due to infection. This was discussed with patient. No fevers, chills, nausea, or vomiting noted at this time. He has been having some discomfort but states that the pain is not terrible. When I push over the abscess area he did have a little bit of discomfort at this point. However he did not really have much in the way of sharp sensation in fact he did not even need me to numb it when I tested him with the scalpel he was not feeling the sharp sensation over the abscess area. He does have a history of Parkinson's disease, hypertension, chronic venous insufficiency, and having had a previous right knee replacement. 05/09/18 on evaluation today patient presents for reevaluation in our clinic regarding issues that he has been having with his right knee. I initially saw him on 03/11/18 for one visit where he had an abscess on the lateral portion of his right knee. Subsequently due to the fact that  this probe down to bone I made a referral by way of call to [facility] here in [facility]. Subsequently the patient was advised to  go to the ER to see the on call physician. This ended up being Dr. Kurtis Rodriguez who has been caring for him since that time. According to review the records it appears that the patient did have his right total knee removal and placement of an antibiotic spacer on 03/17/18. He is also been on IV antibiotic therapy which ended on 05/02/18. The plan at this point is that the patient needs to have the lateral wound on his right lower extremity completely healed prior to any reimplantation of the new prosthesis. The patient has a follow-up appointment with [facility] on 05/17/18 for aspiration of the right knee to be sent for Gram stain, culture, and sensitivity. Overall he was referred to [place] here wound care to help with healing this lateral wound in order to be prepared and ready for the new knee replacement. I did speak with Dr. Harlow Rodriguez myself today 05/09/18 and he is releasing the patient's care to [place] as part of the postop global in order for [place] to aid in getting this wound to heal. Upon inspection today the wound actually appears to be fairly superficial and somewhat hyper granular but fortunately there does not appear to be any evidence of overt infection at this point in time. 05/16/18 on evaluation today patient actually appears to be doing wonderful in regard to his right lateral ankle ulcer. He has been tolerating the dressing changes without complication. With that being said upon evaluation today it appears that the wound is completely healed. There was a small area on the inferior portion of the wound bed that when the eschar that was overlying was removed pulled back a small portion of the new skin causing it to bleed minimally. Nonetheless there was not anything significant at this point to be worried about as far as I was concerned. I am gonna likely  see him back for one week follow-up just to ensure everything is fine but I truly believe it will be healed before then. Patient History Information obtained from Patient. Family History Cancer - Siblings, Heart Disease - Mother, Stroke - Father, No family history of Diabetes, Hypertension, Kidney Disease, Lung Disease, Seizures, Thyroid Problems, Tuberculosis. DARIN, ARNDT ([id number]) Social History Never smoker, Marital Status - Widowed, Alcohol Use - Never, Drug Use - No History, Caffeine Use - Moderate. Review of Systems (ROS) Constitutional Symptoms (General Health) Denies complaints or symptoms of Fever, Chills. Respiratory The patient has no complaints or symptoms. Cardiovascular The patient has no complaints or symptoms. Psychiatric The patient has no complaints or symptoms. Objective Constitutional Well-nourished and well-hydrated in no acute distress. Vitals Time Taken: 9:37 AM, Height: 71 in, Weight: 212 lbs, BMI: 29.6, Temperature: 98.4 F, Pulse: 86 bpm, Respiratory Rate: 16 breaths/min, Blood Pressure: 111/67 mmHg. Respiratory normal breathing without difficulty. clear to auscultation bilaterally. Cardiovascular regular rate and rhythm with normal S1, S2. Psychiatric this patient is able to make decisions and demonstrates good insight into disease process. Alert and Oriented x 3. pleasant and cooperative. General Notes: Upon inspection today the patient's wound bed actually shows evidence of good epithelialization which is great news. In fact there is new skin covering the entirety of the wound except for where it appears part of the eschar coming off might've pulled a little piece of the inferior portion of the new skin back. Nonetheless it stop bleeding within just seconds and this was a tiny region. For all intensive purposes this is completely healed. Integumentary (Hair, Skin) Wound #2 status is Open. Original  cause of wound was Surgical Injury. The wound  is located on the Right,Lateral Knee. The wound measures 0.1cm length x 0.1cm width x 0.1cm depth; 0.008cm^2 area and 0.001cm^3 volume. There is Fat Layer (Subcutaneous Tissue) Exposed exposed. There is no tunneling or undermining noted. There is a medium amount of sanguinous drainage noted. The wound margin is flat and intact. There is no granulation within the wound bed. There is a small (1-33%) amount of necrotic tissue within the wound bed including Eschar. The periwound skin appearance exhibited: Scarring. The periwound skin appearance did not exhibit: Callus, Crepitus, Excoriation, Induration, Rash, Dry/Scaly, Maceration, Atrophie Blanche, Cyanosis, Ecchymosis, Hemosiderin Staining, Mottled, Pallor, Rubor, Erythema. Periwound temperature was noted as No Abnormality. 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 Primary Wound Dressing: Wound #2 Right,Lateral Knee: Boardered Foam Dressing Dressing Change Frequency: Wound #2 Right,Lateral Knee: Other: - as needed Follow-up Appointments: Wound #2 Right,Lateral Knee: Return Appointment in 1 week. - if needed At this point my suggestion is gonna be that we continue with the above wound care measures for the next week. The patient is in agreement with plan. We will subsequently see were things stand at follow-up. If anything changes or worsens in the meantime he'll let me know otherwise if he's completely healed of one weeks time he can cancel that appointment just let us know everything is  okay and they will go from there. Otherwise it's been a pleasure taking care of this gentleman we will see were things stand in one week if needed. Otherwise I wished him the best as far as his the replacement surgeries concerned once everything comes back negative on his aspiration culture. Please see above for specific wound care orders. We will see patient for re-evaluation in 1 week(s) here in the clinic. If anything worsens or changes patient will contact our office for additional recommendations. Electronic Signature(s) Signed: 05/17/2018 11:39:33 AM By: Roger Rodriguez Entered By: Roger Keeler on 05/16/2018 18:12:27 MORTY, ORTWEIN ([id number]) -------------------------------------------------------------------------------- ROS/PFSH Details Patient Name: Roger Rodriguez Date of Service: 05/16/2018 9:30 AM Medical Record Number: [id number] Patient Account Number: [id number] Date of Birth/Sex: 1941/01/19 (77 y.o. M) Treating RN: Roger Rodriguez Primary Care Provider: Cranford Rodriguez, Roger Rodriguez Other Clinician: Referring Provider: Cranford Rodriguez, Roger Rodriguez Treating Provider/Extender: Roger Rodriguez, Konor Noren Weeks in Treatment: 1 Information Obtained From Patient Wound History Do you currently have one or more open woundso Yes How many open wounds do you currently haveo 1 Approximately how long have you had your woundso 7 weeks How have you been treating your wound(s) until nowo unknown Has your wound(s) ever healed and then re-openedo No Have you had any lab work done in the past montho No Have you tested positive for an antibiotic resistant organism (MRSA, VRE)o No Have you tested positive for osteomyelitis (bone infection)o No Have you had any tests for circulation on your legso No Have you had other problems associated with your woundso Infection Constitutional Symptoms (General Health) Complaints and Symptoms: Negative for: Fever; Chills Eyes Medical History: Positive for: Cataracts -  removed bilateral Negative for: Glaucoma; Optic Neuritis Ear/Nose/Mouth/Throat Medical History: Positive for: Chronic sinus problems/congestion  Hematologic/Lymphatic Medical History: Positive for: Lymphedema Negative for: Anemia; Hemophilia; Human Immunodeficiency Virus; Sickle Cell Disease Respiratory Complaints and Symptoms: No Complaints or Symptoms Medical History: Negative for: Aspiration; Asthma; Chronic Obstructive Pulmonary Disease (COPD); Pneumothorax; Sleep Apnea; Tuberculosis Cardiovascular Complaints and Symptoms: No Complaints or Symptoms CAID, RADIN. ([id number]) Medical History: Positive for: Hypertension Negative for: Angina; Arrhythmia; Congestive Heart Failure; Coronary Artery Disease; Deep Vein Thrombosis; Hypotension; Myocardial Infarction; Peripheral Arterial Disease; Peripheral Venous Disease; Phlebitis; Vasculitis Gastrointestinal Medical History: Negative for: Cirrhosis ; Colitis; Crohnos; Hepatitis A; Hepatitis B; Hepatitis C Endocrine Medical History: Negative for: Type I Diabetes; Type II Diabetes Genitourinary Medical History: Negative for: End Stage Renal Disease Immunological Medical History: Negative for: Lupus Erythematosus; Raynaudos Integumentary (Skin) Medical History: Negative for: History of Burn; History of pressure wounds Musculoskeletal Medical History: Positive for: Osteoarthritis Negative for: Gout; Rheumatoid Arthritis; Osteomyelitis Neurologic Medical History: Negative for: Dementia; Neuropathy; Quadriplegia; Paraplegia; Seizure Disorder Oncologic Medical History: Negative for: Received Chemotherapy; Received Radiation Psychiatric Complaints and Symptoms: No Complaints or Symptoms Medical History: Negative for: Anorexia/bulimia; Confinement Anxiety HBO Extended History Items Eyes: Ear/Nose/Mouth/Throat: Cataracts Chronic sinus problems/congestion Immunizations JERIEL, VIVANCO ([id number]) Pneumococcal  Vaccine: Received Pneumococcal Vaccination: Yes Implantable Devices Family and Social History Cancer: Yes - Siblings; Diabetes: No; Heart Disease: Yes - Mother; Hypertension: No; Kidney Disease: No; Lung Disease: No; Seizures: No; Stroke: Yes - Father; Thyroid Problems: No; Tuberculosis: No; Never smoker; Marital Status - Widowed; Alcohol Use: Never; Drug Use: No History; Caffeine Use: Moderate; Financial Concerns: No; Food, Clothing or Shelter Needs: No; Support System Lacking: No; Transportation Concerns: No; Advanced Directives: Yes (Not Provided); Patient does not want information on Advanced Directives; Do not resuscitate: No; Living Will: Yes (Not Provided); Medical Power of Attorney: Yes (Not Provided) Physician Affirmation I have reviewed and agree with the above information. Electronic Signature(s) Signed: 05/17/2018 11:39:33 AM By: Roger Rodriguez Signed: 05/19/2018 10:02:59 AM By: Gretta Cool, BSN, RN, CWS, Kim RN, BSN Entered By: Roger Keeler on 05/16/2018 18:11:01 BUREL, KAHRE ([id number]) -------------------------------------------------------------------------------- SuperBill Details Patient Name: Roger Rodriguez Date of Service: 05/16/2018 Medical Record Number: [id number] Patient Account Number: [id number] Date of Birth/Sex: 20-Sep-1940 (77 y.o. M) Treating RN: Roger Rodriguez Primary Care Provider: Cranford Rodriguez, Roger Rodriguez Other Clinician: Referring Provider: Cranford Rodriguez, Roger Rodriguez Treating Provider/Extender: Roger Rodriguez, Sidonia Nutter Weeks in Treatment: 1 Diagnosis Coding ICD-10 Codes Code Description T84.53XD Infection and inflammatory reaction due to internal right knee prosthesis, subsequent encounter L97.812 Non-pressure chronic ulcer of other part of right lower leg with fat layer exposed [place] Parkinson's disease I10 Essential (primary) hypertension I87.2 Venous insufficiency (chronic) (peripheral) Facility Procedures CPT4 Code: [id number] Description: [id number] - WOUND CARE  VISIT-LEV 2 EST PT Modifier: Quantity: 1 Physician Procedures CPT4: Description Modifier Quantity Code [id number] - WC PHYS LEVEL 3 - EST PT 1 ICD-10 Diagnosis Description T84.53XD Infection and inflammatory reaction due to internal right knee prosthesis, subsequent encounter J00.938 Non-pressure chronic ulcer  of other part of right lower leg with fat layer exposed G20 Parkinson's disease I10 Essential (primary) hypertension Electronic Signature(s) Signed: 05/17/2018 11:39:33 AM By: Roger Rodriguez Entered By: Roger Keeler on 05/16/2018 18:12:40

## 2018-05-22 DIAGNOSIS — F329 Major depressive disorder, single episode, unspecified: Secondary | ICD-10-CM | POA: Diagnosis not present

## 2018-05-22 DIAGNOSIS — M1990 Unspecified osteoarthritis, unspecified site: Secondary | ICD-10-CM | POA: Diagnosis not present

## 2018-05-22 DIAGNOSIS — Z87442 Personal history of urinary calculi: Secondary | ICD-10-CM | POA: Diagnosis not present

## 2018-05-22 DIAGNOSIS — Z89521 Acquired absence of right knee: Secondary | ICD-10-CM | POA: Diagnosis not present

## 2018-05-22 DIAGNOSIS — T8453XA Infection and inflammatory reaction due to internal right knee prosthesis, initial encounter: Secondary | ICD-10-CM | POA: Diagnosis not present

## 2018-05-22 DIAGNOSIS — Z8546 Personal history of malignant neoplasm of prostate: Secondary | ICD-10-CM | POA: Diagnosis not present

## 2018-05-22 DIAGNOSIS — G20 Parkinson's disease: Secondary | ICD-10-CM | POA: Diagnosis not present

## 2018-05-22 DIAGNOSIS — R32 Unspecified urinary incontinence: Secondary | ICD-10-CM | POA: Diagnosis not present

## 2018-05-22 DIAGNOSIS — J309 Allergic rhinitis, unspecified: Secondary | ICD-10-CM | POA: Diagnosis not present

## 2018-05-22 DIAGNOSIS — N189 Chronic kidney disease, unspecified: Secondary | ICD-10-CM | POA: Diagnosis not present

## 2018-05-22 DIAGNOSIS — I129 Hypertensive chronic kidney disease with stage 1 through stage 4 chronic kidney disease, or unspecified chronic kidney disease: Secondary | ICD-10-CM | POA: Diagnosis not present

## 2018-05-22 LAB — BODY FLUID CULTURE: Culture: NO GROWTH

## 2018-05-23 ENCOUNTER — Ambulatory Visit: Payer: No Typology Code available for payment source | Admitting: Physician Assistant

## 2018-05-23 ENCOUNTER — Telehealth: Payer: Self-pay | Admitting: Family Medicine

## 2018-05-23 NOTE — Telephone Encounter (Signed)
Sandi Mariscal as below.

## 2018-05-23 NOTE — Telephone Encounter (Signed)
Merry Proud with [facility] is requesting verbal orders for in home physical therapy as follows:  Twice a week for 2 weeks Once a week for 1 week  Please advise. Thanks TNP

## 2018-05-24 ENCOUNTER — Encounter: Payer: Self-pay | Admitting: *Deleted

## 2018-05-24 ENCOUNTER — Other Ambulatory Visit: Payer: Self-pay | Admitting: *Deleted

## 2018-05-24 NOTE — Patient Outreach (Signed)
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  Insurance : HTA  PHX significant for Hypertension , parkinson disease, prostate CA, Right total knee arthroplasty.   Successful outreach call to patient, he verbalizes managing okay at home with limitations that he has. He discussed plans for upcoming surgery   Patient reports home health RN visited on Saturday for evaluation and he has not received call regarding next visit. . Patient reports having 24 hour support care in place to assist as needed.  Patient states  preference to discuss additional assessments, at visit and agreeable to home visit .   Plan  Will plan initial home visit on this week, for additional care planning and goal setting.    [name], RN, [place] Management Coordinator  [phone]- Mobile [phone]- Toll Free Main Office

## 2018-05-25 ENCOUNTER — Other Ambulatory Visit
Admission: RE | Admit: 2018-05-25 | Discharge: 2018-05-25 | Disposition: A | Discharge status: Home / Self Care | Payer: PPO | Source: Ambulatory Visit | Attending: Orthopedic Surgery | Admitting: Orthopedic Surgery

## 2018-05-25 ENCOUNTER — Ambulatory Visit: Payer: Self-pay | Admitting: *Deleted

## 2018-05-25 DIAGNOSIS — Y838 Other surgical procedures as the cause of abnormal reaction of the patient, or of later complication, without mention of misadventure at the time of the procedure: Secondary | ICD-10-CM | POA: Insufficient documentation

## 2018-05-25 DIAGNOSIS — T8453XD Infection and inflammatory reaction due to internal right knee prosthesis, subsequent encounter: Secondary | ICD-10-CM | POA: Diagnosis not present

## 2018-05-25 LAB — SEDIMENTATION RATE: Sed Rate: 45 mm/hr — ABNORMAL HIGH (ref 0–20)

## 2018-05-26 ENCOUNTER — Encounter: Payer: Self-pay | Admitting: *Deleted

## 2018-05-26 ENCOUNTER — Other Ambulatory Visit: Payer: Self-pay | Admitting: *Deleted

## 2018-05-26 NOTE — Patient Outreach (Signed)
[place] [facility]) Care Management   05/26/2018  Roger Rodriguez 03-May-1941 [id number]  Roger Rodriguez is an 77 y.o. 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  Insurance : HTA  PHX significant for Hypertension , parkinson disease, prostate CA, Right total knee arthroplasty.    Subjective:   Patient discussed upcoming knee surgery and hopeful it be able to get back to walking and doing things on his own.   Objective:  Patient in bathroom on arrival, being assisted by caregiver. BP 128/60 (BP Location: Right Arm, Patient Position: Sitting, Cuff Size: Normal)   Pulse 95   Resp 18   Ht 1.803 m (5\' 11" )   Wt 210 lb (95.3 kg)   SpO2 98%   BMI 29.29 kg/m   Review of Systems  Constitutional: Negative.   HENT: Negative.   Eyes: Negative.   Respiratory: Negative.   Cardiovascular:       Swelling at bilateral knee areas   Gastrointestinal: Negative.   Genitourinary: Negative.   Musculoskeletal: Positive for joint pain.  Skin: Negative.   Neurological: Negative.   Endo/Heme/Allergies: Negative.   Psychiatric/Behavioral: Negative.     Physical Exam  Constitutional: He is oriented to person, place, and time. He appears well-developed and well-nourished.  Cardiovascular: Normal rate and normal heart sounds.  Respiratory: Effort normal.  GI: Soft.  Neurological: He is alert and oriented to person, place, and time.  Skin: Skin is warm and dry.     Psychiatric: He has a normal mood and affect. His behavior is normal. Judgment and thought content normal.    Encounter Medications:   Outpatient Encounter Medications as of 05/26/2018  Medication Sig Note  . budesonide (RHINOCORT AQUA) 32 MCG/ACT nasal spray USE 1 PUFF IN EACH NOSTRIL EVERY DAY AS NEEDED  (Patient taking differently: Place 1 spray into both nostrils 2 (two) times daily. )   . cholecalciferol (VITAMIN D) 1000 units tablet Take 1,000 Units by mouth daily.    [name] docusate sodium (COLACE) 100 MG capsule Take 1 capsule (100 mg total) by mouth 2 (two) times daily.   [name] gabapentin (NEURONTIN) 400 MG capsule Take 400 mg by mouth 3 (three) times daily.    . Hypromellose (ARTIFICIAL TEARS OP) Place 1 drop into both eyes 4 (four) times daily as needed (for dry eyes).    [name] lisinopril (PRINIVIL,ZESTRIL) 2.5 MG tablet Take 2.5 mg by mouth 2 (two) times daily.    [name] loratadine (CLARITIN) 10 MG tablet Take 10 mg by mouth daily.   . Melatonin 3 MG TABS Take 3 mg by mouth at bedtime as needed (for sleep).    . mirabegron ER (MYRBETRIQ) 25 MG TB24 tablet Myrbetriq 25 mg tablet,extended release  TAKE 1 TABLET (25 MG TOTAL) 2 (TWO) TIMES DAILY BY MOUTH. (Patient taking differently: Take 25 mg by mouth 2 (two) times daily. )   . Multiple Vitamin (MULTIVITAMIN WITH MINERALS) TABS tablet Take 1 tablet by mouth daily.   . polyethylene glycol (MIRALAX / GLYCOLAX) packet Take 17 g by mouth daily.   . pramipexole (MIRAPEX) 0.5 MG tablet Take 2 tablets (1 mg total) by mouth at bedtime. (Patient taking differently: Take 0.25-0.5 mg by mouth See admin instructions. Take 0.25 mg by mouth twice daily. Take 0.5 mg by  mouth at bedtime.) 05/26/2018: Taking 1/2 tablet twice daily and whole tablet at bedtime   . RYTARY 23.75-95 MG CPCR Take 3 capsules by mouth 4 (four) times daily.    . vitamin B-12 (CYANOCOBALAMIN) 1000 MCG tablet Take 1,000 mcg by mouth daily.   . vitamin C (ASCORBIC ACID) 500 MG tablet Take 500 mg by mouth daily.   . vitamin E (VITAMIN E) 400 UNIT capsule Take 400 Units by mouth daily.    [name] aspirin 81 MG chewable tablet Chew 1 tablet (81 mg total) by mouth 2 (two) times daily. (Patient not taking: Reported on 05/26/2018)   . HYDROcodone-acetaminophen (NORCO/VICODIN) 5-325 MG tablet Take 1-2 tablets by  mouth every 4 (four) hours as needed for moderate pain (pain score 4-6). (Patient not taking: Reported on 05/26/2018)   . nystatin (MYCOSTATIN/NYSTOP) powder Apply topically 3 (three) times daily. (Patient not taking: Reported on 05/26/2018)    No facility-administered encounter medications on file as of 05/26/2018.     Functional Status:   In your present state of health, do you have any difficulty performing the following activities: 05/26/2018 03/17/2018  Hearing? N N  Vision? N N  Difficulty concentrating or making decisions? N N  Walking or climbing stairs? Y Y  Comment bilateral knee problem -  Dressing or bathing? Y N  Comment 24 hour caregiver -  Doing errands, shopping? Y Y  Comment 24 hour caregiver -  Conservation officer, nature and eating ? Y -  Comment 24 hour caregiver -  Using the Toilet? Y -  Comment has assistance -  In the past six months, have you accidently leaked urine? - -  Comment - -  Do you have problems with loss of bowel control? N -  Managing your Medications? N -  Comment - -  Managing your Finances? N -  Housekeeping or managing your Housekeeping? Y -  Comment caregiver/family supportive -  Some recent data might be hidden    Fall/Depression Screening:    Fall Risk  05/26/2018 02/02/2018 11/18/2017  Falls in the past year? 1 Yes Yes  Number falls in past yr: 1 2 or more 2 or more  Injury with Fall? 1 Yes Yes  Comment - damaged artifical replacement in knee and had to have sx. -  Risk Factor Category  - - -  Risk for fall due to : Impaired balance/gait;History of fall(s);Impaired mobility - History of fall(s)  Follow up Falls evaluation completed;Education provided Falls prevention discussed Falls evaluation completed   PHQ 2/9 Scores 05/26/2018 02/02/2018 11/18/2017 04/15/2017 02/23/2017 10/22/2016 10/02/2015  PHQ - 2 Score 2 2 2 3 4 6 2   PHQ- 9 Score 5 12 11 14 15 17 6     Assessment:  Initial home visit, 24 hour caregiver Roger Rodriguez present during visit.  [facility]  [facility].    Right knee Caregiver Roger Rodriguez discussed making contacts regarding equipment needs, hoyer lift delivered not what anticipated and patient did not trust it , awaiting delivery of another lift , caregiver has coordinated order for hoyer lift be sent to [facility] supply.   Very Limited mobility , caregiver assist from bed to wheelchair by pivoting,discussed may benefit of trapeze bar. Patient has initial home therapy visit on today.  Patient hopeful knee surgery will improve mobility.   Parkinsons- Continuing taking medications as prescribed. Sometimes can't  move legs as he wants too, will talk to Dr.Shah at next visit.  Patient discussed that he believes  grant for Rytary may end soon.  Constipation  Taking miralax and prune juice for relief. Will begin daily plan.   Plan:  [facility] consent on file reviewed Will send in basket message to [facility] pharmacist , [name] regarding Rytary, that she assisted patient with getting  grant on .  Will send PCP visit note  Will plan follow up transition of care call in the next week.   [facility] CM Care Plan Problem One     Most Recent Value  Care Plan Problem One  High risk for readmission related to recent hosptial admission and discharge from SNF for rehab after left knee revisison and placement of antibiotic spacer.   Role Documenting the Problem One  Care Management [place] for Problem One  Active  [facility] Long Term Goal   Patient will not experience an unplanned admission over the next 31 days as evidenced by EMR   [facility] Long Term Goal Start Date  05/20/18  Interventions for Problem One Long Term Goal  Home visit completed. Reinforced taking medications as prescribed, review of symptoms of infection to notify MD of , redness at knee, fever ,   [facility] CM Short Term Goal #1   Patient will report attending all medical appointments over the next 14 days   [facility] CM Short Term Goal #1 Start Date  05/20/18    Interventions for Short Term Goal #1  Discussed upcoming MD visit , and verified caregiver is able to provide transportation.   [facility] CM Short Term Goal #2   Patient will be able to report being active with home health therapy over the next 14 days   [facility] CM Short Term Goal #2 Start Date  05/20/18  Interventions for Short Term Goal #2  Discussed intiial home visit for today.       [name], RN, [place] Management Coordinator  [phone]- Mobile [phone]- Toll Free Main Office

## 2018-05-30 DIAGNOSIS — M1990 Unspecified osteoarthritis, unspecified site: Secondary | ICD-10-CM | POA: Diagnosis not present

## 2018-05-30 DIAGNOSIS — Z87442 Personal history of urinary calculi: Secondary | ICD-10-CM | POA: Diagnosis not present

## 2018-05-30 DIAGNOSIS — G20 Parkinson's disease: Secondary | ICD-10-CM | POA: Diagnosis not present

## 2018-05-30 DIAGNOSIS — R32 Unspecified urinary incontinence: Secondary | ICD-10-CM | POA: Diagnosis not present

## 2018-05-30 DIAGNOSIS — T8453XA Infection and inflammatory reaction due to internal right knee prosthesis, initial encounter: Secondary | ICD-10-CM | POA: Diagnosis not present

## 2018-05-30 DIAGNOSIS — J309 Allergic rhinitis, unspecified: Secondary | ICD-10-CM | POA: Diagnosis not present

## 2018-05-30 DIAGNOSIS — Z89521 Acquired absence of right knee: Secondary | ICD-10-CM | POA: Diagnosis not present

## 2018-05-30 DIAGNOSIS — I129 Hypertensive chronic kidney disease with stage 1 through stage 4 chronic kidney disease, or unspecified chronic kidney disease: Secondary | ICD-10-CM | POA: Diagnosis not present

## 2018-05-30 DIAGNOSIS — N189 Chronic kidney disease, unspecified: Secondary | ICD-10-CM | POA: Diagnosis not present

## 2018-05-30 DIAGNOSIS — Z8546 Personal history of malignant neoplasm of prostate: Secondary | ICD-10-CM | POA: Diagnosis not present

## 2018-05-30 DIAGNOSIS — F329 Major depressive disorder, single episode, unspecified: Secondary | ICD-10-CM | POA: Diagnosis not present

## 2018-06-01 ENCOUNTER — Other Ambulatory Visit: Payer: Self-pay | Admitting: *Deleted

## 2018-06-01 NOTE — Patient Outreach (Signed)
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  Insurance : HTA  PHX significant for Hypertension , parkinson disease, prostate CA, Right total knee arthroplasty.   Successful outreach call to patient , he complained of pain to right shoulder and elbow for the past 2 days, discussed limited movement.  Patient discussed some improvement with recent prescription for hydrocodone. Encouraged patient to discuss with PCP at visit on tomorrow for further evaluation .   Patient discussed that he still has 24 hour caregivers, new hoyer lift has arrived and home health therapy as used it .  Patient reports that he is currently sitting up in his wheelchair at this time.   Patient discussed upcoming knee surgery on next week and hopeful for improvement with mobility.   Patient discussed his appetite is still good and reports having normal bowel movements.   Plan  Will plan transition of care outreach call in the next week prior to surgery.    [name], RN, [place] Management Coordinator  [phone]- Mobile [phone]- Toll Free Main Office

## 2018-06-02 ENCOUNTER — Ambulatory Visit: Payer: Self-pay | Admitting: Pharmacist

## 2018-06-02 ENCOUNTER — Encounter: Payer: Self-pay | Admitting: Family Medicine

## 2018-06-02 ENCOUNTER — Ambulatory Visit (INDEPENDENT_AMBULATORY_CARE_PROVIDER_SITE_OTHER): Payer: PPO | Admitting: Family Medicine

## 2018-06-02 VITALS — BP 142/68 | HR 96 | Temp 98.9°F | Resp 16

## 2018-06-02 DIAGNOSIS — G20 Parkinson's disease: Secondary | ICD-10-CM

## 2018-06-02 DIAGNOSIS — M109 Gout, unspecified: Secondary | ICD-10-CM | POA: Diagnosis not present

## 2018-06-02 DIAGNOSIS — M170 Bilateral primary osteoarthritis of knee: Secondary | ICD-10-CM | POA: Diagnosis not present

## 2018-06-02 DIAGNOSIS — Z01818 Encounter for other preprocedural examination: Secondary | ICD-10-CM

## 2018-06-02 DIAGNOSIS — E785 Hyperlipidemia, unspecified: Secondary | ICD-10-CM

## 2018-06-02 DIAGNOSIS — Z23 Encounter for immunization: Secondary | ICD-10-CM | POA: Diagnosis not present

## 2018-06-02 DIAGNOSIS — T8450XD Infection and inflammatory reaction due to unspecified internal joint prosthesis, subsequent encounter: Secondary | ICD-10-CM | POA: Diagnosis not present

## 2018-06-02 DIAGNOSIS — I10 Essential (primary) hypertension: Secondary | ICD-10-CM | POA: Diagnosis not present

## 2018-06-02 DIAGNOSIS — T8459XD Infection and inflammatory reaction due to other internal joint prosthesis, subsequent encounter: Secondary | ICD-10-CM | POA: Diagnosis not present

## 2018-06-02 DIAGNOSIS — Z96659 Presence of unspecified artificial knee joint: Secondary | ICD-10-CM | POA: Diagnosis not present

## 2018-06-02 LAB — POCT URINALYSIS DIPSTICK
Bilirubin, UA: NEGATIVE
Blood, UA: NEGATIVE
Glucose, UA: NEGATIVE
Ketones, UA: NEGATIVE
Leukocytes, UA: NEGATIVE
Nitrite, UA: NEGATIVE
Protein, UA: NEGATIVE
Spec Grav, UA: 1.02 (ref 1.010–1.025)
Urobilinogen, UA: 0.2 E.U./dL
pH, UA: 6 (ref 5.0–8.0)

## 2018-06-02 NOTE — Progress Notes (Signed)
Patient: Roger Rodriguez Male    DOB: [birth year]   77 y.o.   MRN: [id number] Visit Date: 06/02/2018  Today's Provider: Wilhemena Durie, MD   Chief Complaint  Patient presents with  . Pre-op Exam   Subjective:    HPI Patient comes in today for a pre-op exam. He is having total knee replacement on 06/08/18. He feels well today with no other complaints.     Allergies  Allergen Reactions  . Sulfa Antibiotics Swelling    Lip swelling     Current Outpatient Medications:  .  budesonide (RHINOCORT AQUA) 32 MCG/ACT nasal spray, USE 1 PUFF IN EACH NOSTRIL EVERY DAY AS NEEDED (Patient taking differently: Place 1 spray into both nostrils daily as needed for allergies. ), Disp: 1 Bottle, Rfl: 5 .  cholecalciferol (VITAMIN D) 1000 units tablet, Take 1,000 Units by mouth daily. , Disp: , Rfl:  .  docusate sodium (COLACE) 100 MG capsule, Take 1 capsule (100 mg total) by mouth 2 (two) times daily. (Patient taking differently: Take 100 mg by mouth daily as needed for mild constipation. ), Disp: 10 capsule, Rfl: 0 .  gabapentin (NEURONTIN) 400 MG capsule, Take 1,200 mg by mouth 3 (three) times daily. , Disp: , Rfl: 2 .  HYDROcodone-acetaminophen (NORCO/VICODIN) 5-325 MG tablet, Take 1-2 tablets by mouth every 4 (four) hours as needed for moderate pain (pain score 4-6). (Patient taking differently: Take 1 tablet by mouth 2 (two) times daily as needed for moderate pain (pain score 4-6). ), Disp: 30 tablet, Rfl: 0 .  Hypromellose (ARTIFICIAL TEARS OP), Place 1 drop into both eyes 4 (four) times daily as needed (for dry eyes). , Disp: , Rfl:  .  lisinopril (PRINIVIL,ZESTRIL) 2.5 MG tablet, Take 2.5 mg by mouth 2 (two) times daily. , Disp: , Rfl:  .  loratadine (CLARITIN) 10 MG tablet, Take 10 mg by mouth daily., Disp: , Rfl:  .  mirabegron ER (MYRBETRIQ) 25 MG TB24 tablet, Myrbetriq 25 mg tablet,extended release  TAKE 1 TABLET (25 MG TOTAL) 2 (TWO) TIMES DAILY BY MOUTH. (Patient taking  differently: Take 25 mg by mouth daily. ), Disp: 30 tablet, Rfl: 11 .  Multiple Vitamin (MULTIVITAMIN WITH MINERALS) TABS tablet, Take 1 tablet by mouth daily., Disp: , Rfl:  .  polyethylene glycol (MIRALAX / GLYCOLAX) packet, Take 17 g by mouth daily., Disp: , Rfl:  .  pramipexole (MIRAPEX) 0.5 MG tablet, Take 2 tablets (1 mg total) by mouth at bedtime. (Patient taking differently: Take 0.25-0.5 mg by mouth See admin instructions. Take 0.25 mg by mouth twice daily. Take 0.5 mg by mouth at bedtime.), Disp: 30 tablet, Rfl: 0 .  RYTARY 23.75-95 MG CPCR, Take 3 capsules by mouth 4 (four) times daily. , Disp: , Rfl: 3 .  vitamin B-12 (CYANOCOBALAMIN) 1000 MCG tablet, Take 1,000 mcg by mouth daily., Disp: , Rfl:  .  vitamin C (ASCORBIC ACID) 500 MG tablet, Take 500 mg by mouth daily., Disp: , Rfl:  .  vitamin E (VITAMIN E) 400 UNIT capsule, Take 400 Units by mouth daily. , Disp: , Rfl:   Review of Systems  Constitutional: Positive for activity change. Negative for fatigue.  HENT: Negative.   Eyes: Negative.   Respiratory: Negative.  Negative for cough and shortness of breath.   Cardiovascular: Positive for leg swelling. Negative for chest pain and palpitations.  Gastrointestinal: Negative.   Endocrine: Negative.   Musculoskeletal: Positive for arthralgias, back pain, gait  problem, joint swelling and myalgias.       Recent right arm pain--no trauma--it hurts in entire arm with movement but especially right wrist today.  Skin: Negative.   Allergic/Immunologic: Negative.   Neurological: Positive for tremors and weakness.  Hematological: Negative.   Psychiatric/Behavioral: Negative.     Social History   Tobacco Use  . Smoking status: Former Smoker    Types: Cigars    Last attempt to quit: 11/11/1968    Years since quitting: 49.5  . Smokeless tobacco: Former Systems developer    Types: Chew    Quit date: 11/12/2003  . Tobacco comment: Occassionally, also chewed tobacco till 2005. Quit smoking 1973    Substance Use Topics  . Alcohol use: No    Alcohol/week: 0.0 standard drinks   Objective:   BP (!) 142/68 (BP Location: Left Arm, Patient Position: Sitting, Cuff Size: Large)   Pulse 96   Temp 98.9 F (37.2 C)   Resp 16   SpO2 97%  Vitals:   06/02/18 1502  BP: (!) 142/68  Pulse: 96  Resp: 16  Temp: 98.9 F (37.2 C)  SpO2: 97%     Physical Exam  Constitutional: He is oriented to person, place, and time. He appears well-developed and well-nourished.  HENT:  Head: Normocephalic and atraumatic.  Right Ear: External ear normal.  Left Ear: External ear normal.  Nose: Nose normal.  Eyes: Conjunctivae are normal. No scleral icterus.  Neck: No thyromegaly present.  Cardiovascular: Normal rate, regular rhythm and normal heart sounds.  Pulmonary/Chest: Effort normal and breath sounds normal.  Abdominal: Soft.  Musculoskeletal: He exhibits tenderness. He exhibits no edema.  Right wrist mildly stiff ,tender,and warm to touch without erythema.  Neurological: He is alert and oriented to person, place, and time.  Skin: Skin is warm and dry.  Psychiatric: He has a normal mood and affect. His behavior is normal. Judgment and thought content normal.   ECG unchanged with stable repolarization variant.      Assessment & Plan:     1. Pre-op examination Pt cleared for surgery. - EKG 12-Lead - POCT urinalysis dipstick  2. Essential (primary) hypertension  - Comprehensive metabolic panel  3. Hyperlipidemia, unspecified hyperlipidemia type  - CBC with Differential/Platelet - Albumin - Hemoglobin A1c  4. Idiopathic Parkinson's disease (HCC)  - CBC with Differential/Platelet  5. Gout, unspecified cause, unspecified chronicity, unspecified site  - Uric acid     6. Infection of prosthetic knee joint, subsequent encounter   8. Primary osteoarthritis of both knees Pt with right arm pain today--to see ortho soon--possible gout vs other.  9. Parkinson's disease  ([place])       I have done the exam and reviewed the above chart and it is accurate to the best of my knowledge. Development worker, community has been used in this note in any air is in the dictation or transcription are unintentional.  Wilhemena Durie, MD  [place]

## 2018-06-03 ENCOUNTER — Other Ambulatory Visit: Payer: No Typology Code available for payment source

## 2018-06-03 ENCOUNTER — Encounter
Admission: RE | Admit: 2018-06-03 | Discharge: 2018-06-03 | Disposition: A | Discharge status: Home / Self Care | Payer: PPO | Source: Ambulatory Visit | Attending: Orthopedic Surgery | Admitting: Orthopedic Surgery

## 2018-06-03 ENCOUNTER — Other Ambulatory Visit: Payer: Self-pay

## 2018-06-03 ENCOUNTER — Ambulatory Visit
Admission: RE | Admit: 2018-06-03 | Discharge: 2018-06-03 | Disposition: A | Discharge status: Home / Self Care | Payer: PPO | Source: Ambulatory Visit | Attending: Orthopedic Surgery | Admitting: Orthopedic Surgery

## 2018-06-03 ENCOUNTER — Other Ambulatory Visit: Payer: Self-pay | Admitting: Orthopedic Surgery

## 2018-06-03 DIAGNOSIS — T8453XD Infection and inflammatory reaction due to internal right knee prosthesis, subsequent encounter: Secondary | ICD-10-CM

## 2018-06-03 DIAGNOSIS — M25561 Pain in right knee: Secondary | ICD-10-CM | POA: Diagnosis not present

## 2018-06-03 DIAGNOSIS — Y838 Other surgical procedures as the cause of abnormal reaction of the patient, or of later complication, without mention of misadventure at the time of the procedure: Secondary | ICD-10-CM | POA: Diagnosis not present

## 2018-06-03 DIAGNOSIS — Z01812 Encounter for preprocedural laboratory examination: Secondary | ICD-10-CM

## 2018-06-03 LAB — URIC ACID: Uric Acid: 4.6 mg/dL (ref 3.7–8.6)

## 2018-06-03 LAB — CBC WITH DIFFERENTIAL/PLATELET
Basophils Absolute: 0 10*3/uL (ref 0.0–0.2)
Basos: 0 %
EOS (ABSOLUTE): 0.1 10*3/uL (ref 0.0–0.4)
Eos: 1 %
Hematocrit: 40 % (ref 37.5–51.0)
Hemoglobin: 13.6 g/dL (ref 13.0–17.7)
Immature Grans (Abs): 0 10*3/uL (ref 0.0–0.1)
Immature Granulocytes: 0 %
Lymphocytes Absolute: 0.9 10*3/uL (ref 0.7–3.1)
Lymphs: 9 %
MCH: 29.8 pg (ref 26.6–33.0)
MCHC: 34 g/dL (ref 31.5–35.7)
MCV: 88 fL (ref 79–97)
Monocytes Absolute: 1.2 10*3/uL — ABNORMAL HIGH (ref 0.1–0.9)
Monocytes: 11 %
Neutrophils Absolute: 8.4 10*3/uL — ABNORMAL HIGH (ref 1.4–7.0)
Neutrophils: 79 %
Platelets: 303 10*3/uL (ref 150–450)
RBC: 4.57 x10E6/uL (ref 4.14–5.80)
RDW: 14 % (ref 12.3–15.4)
WBC: 10.7 10*3/uL (ref 3.4–10.8)

## 2018-06-03 LAB — COMPREHENSIVE METABOLIC PANEL
ALT: 10 IU/L (ref 0–44)
AST: 14 IU/L (ref 0–40)
Albumin/Globulin Ratio: 1 — ABNORMAL LOW (ref 1.2–2.2)
Albumin: 4.1 g/dL (ref 3.5–4.8)
Alkaline Phosphatase: 94 IU/L (ref 39–117)
BUN/Creatinine Ratio: 16 (ref 10–24)
BUN: 18 mg/dL (ref 8–27)
Bilirubin Total: 0.6 mg/dL (ref 0.0–1.2)
CO2: 23 mmol/L (ref 20–29)
Calcium: 9.4 mg/dL (ref 8.6–10.2)
Chloride: 93 mmol/L — ABNORMAL LOW (ref 96–106)
Creatinine, Ser: 1.1 mg/dL (ref 0.76–1.27)
GFR calc Af Amer: 74 mL/min/{1.73_m2} (ref >59)
GFR calc non Af Amer: 64 mL/min/{1.73_m2} (ref >59)
Globulin, Total: 4.1 g/dL (ref 1.5–4.5)
Glucose: 187 mg/dL — ABNORMAL HIGH (ref 65–99)
Potassium: 4.6 mmol/L (ref 3.5–5.2)
Sodium: 135 mmol/L (ref 134–144)
Total Protein: 8.2 g/dL (ref 6.0–8.5)

## 2018-06-03 LAB — PROTIME-INR
INR: 1.08
Prothrombin Time: 13.9 seconds (ref 11.4–15.2)

## 2018-06-03 LAB — HEMOGLOBIN A1C
Est. average glucose Bld gHb Est-mCnc: 163 mg/dL
Hgb A1c MFr Bld: 7.3 % — ABNORMAL HIGH (ref 4.8–5.6)

## 2018-06-03 LAB — APTT: aPTT: 37 seconds — ABNORMAL HIGH (ref 24–36)

## 2018-06-03 NOTE — Patient Instructions (Signed)
Your procedure is scheduled on: Wednesday 06/08/18.  Report to DAY SURGERY DEPARTMENT LOCATED ON 2ND FLOOR MEDICAL MALL ENTRANCE. To find out your arrival time please call [phone] between 1PM - 3PM on Tuesday 06/07/18.  Remember: Instructions that are not followed completely may result in serious medical risk, up to and including death, or upon the discretion of your surgeon and anesthesiologist your surgery may need to be rescheduled.     _X__ 1. Do not eat food after midnight the night before your procedure.                 No gum chewing or hard candies. You may drink clear liquids up to 2 hours                 before you are scheduled to arrive for your surgery- DO NOT drink clear                 liquids within 2 hours of the start of your surgery.                 Clear Liquids include:  water, apple juice without pulp, clear carbohydrate                 drink such as Clearfast or Gatorade, Black Coffee or Tea (Do not add                 anything to coffee or tea).  __X__2.  On the morning of surgery brush your teeth with toothpaste and water, you may rinse your mouth with mouthwash if you wish.  Do not swallow any toothpaste or mouthwash.     __X__ 3.  Notify your doctor if there is any change in your medical condition      (cold, fever, infections).     Do not wear jewelry, make-up, hairpins, clips or nail polish. Do not wear lotions, powders, or perfumes. You may wear deodorant. Do not shave 48 hours prior to surgery. Men may shave face and neck. Do not bring valuables to the hospital.    [facility] is not responsible for any belongings or valuables.  Contacts, dentures/partials or body piercings may not be worn into surgery. Bring a case for your contacts, glasses or hearing aids, a denture cup will be supplied.  Leave your suitcase in the car. After surgery it may be brought to your room. For patients admitted to the hospital, discharge time is determined by  your treatment team.   Patients discharged the day of surgery will not be allowed to drive home.    Please read over the following fact sheets that you were given:   MRSA Information   __X__ Take these medicines the morning of surgery with A SIP OF WATER:     1. gabapentin (NEURONTIN) 400 MG capsule  2. Hypromellose (ARTIFICIAL TEARS OP)  3. loratadine (CLARITIN) 10 MG tablet  4. mirabegron ER (MYRBETRIQ) 25 MG TB24 tablet  5. pramipexole (MIRAPEX) 0.5 MG tablet  6. RYTARY 23.75-95 MG CPCR  7. HYDROcodone-acetaminophen (NORCO/VICODIN) 5-325 MG tablet    __X__ Use SAGE wipes as directed   __X__ Stop Anti-inflammatories 7 days before surgery such as Advil, Ibuprofen, Motrin, BC or Goodies Powder, Naprosyn, Naproxen, Aleve, Aspirin, Meloxicam. May take Tylenol if needed for pain or discomfort.    __X__ Stop the following herbal supplements today:  vitamin C (ASCORBIC ACID) 500 MG tablet  vitamin E (VITAMIN E) 400 UNIT capsule

## 2018-06-03 NOTE — Chronic Care Management (AMB) (Signed)
  Care Management   Note  06/03/2018 Name: Roger Rodriguez MRN: [id number] DOB: 10/25/1940    Subjective:  77 y.o. year old male referred to [facility] pharmacist for medication assistance.   Referral source: [place] Referral medication(s): Rytary Current insurance [employer] Currently receiving Extra Help:  []  Yes [x]  No []  Unknown  I have previously worked with this patient in July 2019. We successfully applied for a grant from the [place] for his [place]. Fatima Sanger awarded was $4200.   Objective: [name] states he is worried his grant has run out. He last picked up a 14 day supply of Rytary and the copay was over $120.  He has also had a stay in a SNF due to knee replacement surgery. I'm not sure how this affects his PAN grant.   Plan: I will follow up with PAN foundation and dispensing pharmacy to see if there was an error with billing the grant (if the SNF didn't file with the grant) or if the grant has run out. If the grant has run out, we will reapply. Follow up within 1 week.    [name], PharmD Clinical Pharmacist [place] [phone]

## 2018-06-06 ENCOUNTER — Telehealth: Payer: Self-pay | Admitting: Family Medicine

## 2018-06-06 ENCOUNTER — Telehealth: Payer: Self-pay | Admitting: *Deleted

## 2018-06-06 ENCOUNTER — Ambulatory Visit: Payer: Self-pay | Admitting: Pharmacist

## 2018-06-06 DIAGNOSIS — G20 Parkinson's disease: Secondary | ICD-10-CM

## 2018-06-06 NOTE — Telephone Encounter (Signed)
LMOVM for pt to return call 

## 2018-06-06 NOTE — Telephone Encounter (Signed)
-----   Message from [name]., MD sent at 06/06/2018  2:21 PM EST ----- Labs stable.  Diabetes a little higher.  Work on lifestyle

## 2018-06-06 NOTE — Telephone Encounter (Signed)
Pt returning missed call.  Pt needing a call back for lab results.  Thanks, [employer]

## 2018-06-07 MED ORDER — CEFAZOLIN SODIUM-DEXTROSE 2-4 GM/100ML-% IV SOLN
2.0000 g | INTRAVENOUS | Status: AC
  Administered 2018-06-08: 2 g via INTRAVENOUS

## 2018-06-07 MED ORDER — TRANEXAMIC ACID-NACL 1000-0.7 MG/100ML-% IV SOLN
1000.0000 mg | INTRAVENOUS | Status: AC
  Administered 2018-06-08: 1000 mg via INTRAVENOUS
  Filled 2018-06-07: qty 100

## 2018-06-07 NOTE — Telephone Encounter (Signed)
Advised 

## 2018-06-07 NOTE — Chronic Care Management (AMB) (Signed)
  Care Management   Note  06/07/2018 Name: LASHAUN KRAPF MRN: [id number] DOB: 02-Aug-1940    Care Management   Note  06/03/2018 Name: RHYLAND HINDERLITER         MRN: [id number]       DOB: May 06, 1941    Subjective:  77 y.o. year old male referred to [facility] pharmacist for medication assistance.   Referral source: [place] Referral medication(s): Rytary Current insurance [employer] Currently receiving Extra Help:  []  Yes [x]  No []  Unknown  I have previously worked with this patient in July 2019. We successfully applied for a grant from the [place] for his [place]. Fatima Sanger awarded was $4200.   Objective: Mr. Pollack states he is worried his grant has run out. He last picked up a 14 day supply of Rytary and the copay was over $120.  He has also had a stay in a SNF due to knee replacement surgery.   I placed a call to the [employer]. Mr. Wacker has $3,700 remaining on his grant. [facility] did not charge his grant for his September and November fills of Rytary.   Placed cal to [facility] to confirm. Technician verified that patient paid $127 instead of running the claim through the grant. I asked the technician to please flag the Rytary in their system to be run through the [employer] on file.   Plan: [facility] technician stated too much time had passed for [facility] to reprocess claim. Instead, Mr. Maragh or myself on his behalf can submit paperwork to the [facility] for reimbursement. I mailed the forms to Mr. Chisolm. Mr. Feutz needs to fill out the forms and receive printouts from [facility] for September and November Rytary fills. Follow up in 2 weeks via telephone call.   [name], PharmD Clinical Pharmacist [place] [phone]

## 2018-06-08 ENCOUNTER — Inpatient Hospital Stay
Admission: RE | Admit: 2018-06-08 | Discharge: 2018-06-10 | DRG: 468 | Disposition: A | Discharge status: Skilled Nursing Facility | Payer: PPO | Attending: Specialist | Admitting: Specialist

## 2018-06-08 ENCOUNTER — Other Ambulatory Visit: Payer: Self-pay

## 2018-06-08 ENCOUNTER — Inpatient Hospital Stay: Payer: PPO

## 2018-06-08 ENCOUNTER — Inpatient Hospital Stay: Payer: PPO | Admitting: Certified Registered Nurse Anesthetist

## 2018-06-08 ENCOUNTER — Encounter
Admission: RE | Disposition: A | Discharge status: Skilled Nursing Facility | Payer: Self-pay | Source: Home / Self Care | Attending: Orthopedic Surgery

## 2018-06-08 DIAGNOSIS — Z87891 Personal history of nicotine dependence: Secondary | ICD-10-CM | POA: Diagnosis not present

## 2018-06-08 DIAGNOSIS — T8453XA Infection and inflammatory reaction due to internal right knee prosthesis, initial encounter: Principal | ICD-10-CM | POA: Diagnosis present

## 2018-06-08 DIAGNOSIS — G20 Parkinson's disease: Secondary | ICD-10-CM | POA: Diagnosis not present

## 2018-06-08 DIAGNOSIS — Z743 Need for continuous supervision: Secondary | ICD-10-CM | POA: Diagnosis not present

## 2018-06-08 DIAGNOSIS — Y831 Surgical operation with implant of artificial internal device as the cause of abnormal reaction of the patient, or of later complication, without mention of misadventure at the time of the procedure: Secondary | ICD-10-CM | POA: Diagnosis present

## 2018-06-08 DIAGNOSIS — M25561 Pain in right knee: Secondary | ICD-10-CM | POA: Diagnosis not present

## 2018-06-08 DIAGNOSIS — I10 Essential (primary) hypertension: Secondary | ICD-10-CM | POA: Diagnosis not present

## 2018-06-08 DIAGNOSIS — Z7901 Long term (current) use of anticoagulants: Secondary | ICD-10-CM | POA: Diagnosis not present

## 2018-06-08 DIAGNOSIS — I129 Hypertensive chronic kidney disease with stage 1 through stage 4 chronic kidney disease, or unspecified chronic kidney disease: Secondary | ICD-10-CM | POA: Diagnosis not present

## 2018-06-08 DIAGNOSIS — Z96651 Presence of right artificial knee joint: Secondary | ICD-10-CM | POA: Diagnosis not present

## 2018-06-08 DIAGNOSIS — R739 Hyperglycemia, unspecified: Secondary | ICD-10-CM | POA: Diagnosis not present

## 2018-06-08 DIAGNOSIS — E785 Hyperlipidemia, unspecified: Secondary | ICD-10-CM | POA: Diagnosis not present

## 2018-06-08 DIAGNOSIS — G8918 Other acute postprocedural pain: Secondary | ICD-10-CM | POA: Diagnosis not present

## 2018-06-08 DIAGNOSIS — Z79899 Other long term (current) drug therapy: Secondary | ICD-10-CM | POA: Diagnosis not present

## 2018-06-08 DIAGNOSIS — Z791 Long term (current) use of non-steroidal anti-inflammatories (NSAID): Secondary | ICD-10-CM

## 2018-06-08 DIAGNOSIS — N189 Chronic kidney disease, unspecified: Secondary | ICD-10-CM | POA: Diagnosis not present

## 2018-06-08 DIAGNOSIS — Z471 Aftercare following joint replacement surgery: Secondary | ICD-10-CM | POA: Diagnosis not present

## 2018-06-08 DIAGNOSIS — R2689 Other abnormalities of gait and mobility: Secondary | ICD-10-CM | POA: Diagnosis not present

## 2018-06-08 DIAGNOSIS — Z09 Encounter for follow-up examination after completed treatment for conditions other than malignant neoplasm: Secondary | ICD-10-CM

## 2018-06-08 HISTORY — PX: TOTAL KNEE REVISION: SHX996

## 2018-06-08 LAB — TYPE AND SCREEN
ABO/RH(D): O POS
Antibody Screen: NEGATIVE

## 2018-06-08 LAB — SURGICAL PCR SCREEN
MRSA, PCR: NEGATIVE
Staphylococcus aureus: POSITIVE — AB

## 2018-06-08 LAB — ABO/RH: ABO/RH(D): O POS

## 2018-06-08 SURGERY — TOTAL KNEE REVISION
Anesthesia: General | Site: Knee | Laterality: Right

## 2018-06-08 MED ORDER — ACETAMINOPHEN 500 MG PO TABS
500.0000 mg | ORAL_TABLET | Freq: Four times a day (QID) | ORAL | Status: AC
  Administered 2018-06-08 – 2018-06-09 (×3): 500 mg via ORAL
  Filled 2018-06-08 (×4): qty 1

## 2018-06-08 MED ORDER — ROPIVACAINE HCL 5 MG/ML IJ SOLN
INTRAMUSCULAR | Status: DC | PRN
  Administered 2018-06-08: 30 mL via PERINEURAL

## 2018-06-08 MED ORDER — BUPIVACAINE HCL (PF) 0.5 % IJ SOLN
INTRAMUSCULAR | Status: DC | PRN
  Administered 2018-06-08: 30 mL

## 2018-06-08 MED ORDER — BACITRACIN 50000 UNITS IM SOLR
INTRAMUSCULAR | Status: AC
  Filled 2018-06-08: qty 1

## 2018-06-08 MED ORDER — FAMOTIDINE 20 MG PO TABS
20.0000 mg | ORAL_TABLET | Freq: Once | ORAL | Status: AC
  Administered 2018-06-08: 20 mg via ORAL

## 2018-06-08 MED ORDER — CARBIDOPA-LEVODOPA ER 23.75-95 MG PO CPCR
3.0000 | ORAL_CAPSULE | Freq: Four times a day (QID) | ORAL | Status: DC
  Administered 2018-06-08 – 2018-06-10 (×7): 3 via ORAL
  Filled 2018-06-08 (×9): qty 3

## 2018-06-08 MED ORDER — NALOXONE HCL 0.4 MG/ML IJ SOLN
INTRAMUSCULAR | Status: DC | PRN
  Administered 2018-06-08: 0.1 mg via INTRAVENOUS

## 2018-06-08 MED ORDER — EPHEDRINE SULFATE 50 MG/ML IJ SOLN
INTRAMUSCULAR | Status: DC | PRN
  Administered 2018-06-08: 15 mg via INTRAVENOUS

## 2018-06-08 MED ORDER — ACETAMINOPHEN 325 MG PO TABS: 325.0000 mg | ORAL_TABLET | Freq: Four times a day (QID) | ORAL | Status: DC | PRN

## 2018-06-08 MED ORDER — POLYETHYLENE GLYCOL 3350 17 G PO PACK
17.0000 g | PACK | Freq: Every day | ORAL | Status: DC
  Administered 2018-06-09 – 2018-06-10 (×2): 17 g via ORAL
  Filled 2018-06-08 (×2): qty 1

## 2018-06-08 MED ORDER — DEXAMETHASONE SODIUM PHOSPHATE 10 MG/ML IJ SOLN
INTRAMUSCULAR | Status: AC
  Filled 2018-06-08: qty 1

## 2018-06-08 MED ORDER — FENTANYL CITRATE (PF) 100 MCG/2ML IJ SOLN
INTRAMUSCULAR | Status: DC | PRN
  Administered 2018-06-08 (×5): 50 ug via INTRAVENOUS

## 2018-06-08 MED ORDER — CARBIDOPA-LEVODOPA 25-100 MG PO TABS
3.0000 | ORAL_TABLET | Freq: Four times a day (QID) | ORAL | Status: DC
  Filled 2018-06-08 (×2): qty 3

## 2018-06-08 MED ORDER — PRAMIPEXOLE DIHYDROCHLORIDE 0.25 MG PO TABS
1.0000 mg | ORAL_TABLET | Freq: Every day | ORAL | Status: DC
  Administered 2018-06-08 – 2018-06-09 (×2): 1 mg via ORAL
  Filled 2018-06-08 (×2): qty 4

## 2018-06-08 MED ORDER — PHENYLEPHRINE HCL 10 MG/ML IJ SOLN
INTRAMUSCULAR | Status: DC | PRN
  Administered 2018-06-08 (×2): 100 ug via INTRAVENOUS
  Administered 2018-06-08: 200 ug via INTRAVENOUS
  Administered 2018-06-08 (×2): 100 ug via INTRAVENOUS
  Administered 2018-06-08: 200 ug via INTRAVENOUS
  Administered 2018-06-08 (×3): 100 ug via INTRAVENOUS

## 2018-06-08 MED ORDER — ONDANSETRON HCL 4 MG PO TABS: 4.0000 mg | ORAL_TABLET | Freq: Four times a day (QID) | ORAL | Status: DC | PRN

## 2018-06-08 MED ORDER — ONDANSETRON HCL 4 MG/2ML IJ SOLN
INTRAMUSCULAR | Status: AC
  Filled 2018-06-08: qty 2

## 2018-06-08 MED ORDER — NALOXONE HCL 0.4 MG/ML IJ SOLN
INTRAMUSCULAR | Status: AC
  Filled 2018-06-08: qty 1

## 2018-06-08 MED ORDER — LIDOCAINE HCL (PF) 1 % IJ SOLN
INTRAMUSCULAR | Status: AC
  Filled 2018-06-08: qty 5

## 2018-06-08 MED ORDER — LACTATED RINGERS IV SOLN
INTRAVENOUS | Status: DC
  Administered 2018-06-08 (×2): via INTRAVENOUS

## 2018-06-08 MED ORDER — ACETAMINOPHEN 500 MG PO TABS
1000.0000 mg | ORAL_TABLET | Freq: Once | ORAL | Status: AC
  Administered 2018-06-08: 1000 mg via ORAL

## 2018-06-08 MED ORDER — ONDANSETRON HCL 4 MG/2ML IJ SOLN
INTRAMUSCULAR | Status: DC | PRN
  Administered 2018-06-08: 4 mg via INTRAVENOUS

## 2018-06-08 MED ORDER — ROCURONIUM BROMIDE 100 MG/10ML IV SOLN
INTRAVENOUS | Status: DC | PRN
  Administered 2018-06-08: 40 mg via INTRAVENOUS
  Administered 2018-06-08: 20 mg via INTRAVENOUS
  Administered 2018-06-08: 30 mg via INTRAVENOUS
  Administered 2018-06-08: 10 mg via INTRAVENOUS

## 2018-06-08 MED ORDER — PHENOL 1.4 % MT LIQD
1.0000 | OROMUCOSAL | Status: DC | PRN
  Filled 2018-06-08: qty 177

## 2018-06-08 MED ORDER — FENTANYL CITRATE (PF) 100 MCG/2ML IJ SOLN
INTRAMUSCULAR | Status: AC
  Administered 2018-06-08: 50 ug via INTRAVENOUS
  Filled 2018-06-08: qty 2

## 2018-06-08 MED ORDER — BUPIVACAINE HCL (PF) 0.5 % IJ SOLN
INTRAMUSCULAR | Status: AC
  Filled 2018-06-08: qty 30

## 2018-06-08 MED ORDER — ACETAMINOPHEN 500 MG PO TABS
ORAL_TABLET | ORAL | Status: AC
  Filled 2018-06-08: qty 2

## 2018-06-08 MED ORDER — HYDROCODONE-ACETAMINOPHEN 5-325 MG PO TABS
1.0000 | ORAL_TABLET | ORAL | Status: DC | PRN
  Administered 2018-06-09 (×2): 1 via ORAL
  Filled 2018-06-08 (×2): qty 1

## 2018-06-08 MED ORDER — SODIUM CHLORIDE 0.9 % IV SOLN
INTRAVENOUS | Status: DC | PRN
  Administered 2018-06-08: 20 ug/min via INTRAVENOUS

## 2018-06-08 MED ORDER — LACTATED RINGERS IV SOLN
INTRAVENOUS | Status: DC
  Administered 2018-06-08 (×2): via INTRAVENOUS

## 2018-06-08 MED ORDER — PROPOFOL 10 MG/ML IV BOLUS
INTRAVENOUS | Status: DC | PRN
  Administered 2018-06-08: 120 mg via INTRAVENOUS

## 2018-06-08 MED ORDER — POLYVINYL ALCOHOL 1.4 % OP SOLN
1.0000 [drp] | Freq: Four times a day (QID) | OPHTHALMIC | Status: DC | PRN
  Filled 2018-06-08: qty 15

## 2018-06-08 MED ORDER — CEFAZOLIN SODIUM-DEXTROSE 2-4 GM/100ML-% IV SOLN
INTRAVENOUS | Status: AC
  Filled 2018-06-08: qty 100

## 2018-06-08 MED ORDER — DOCUSATE SODIUM 100 MG PO CAPS
100.0000 mg | ORAL_CAPSULE | Freq: Two times a day (BID) | ORAL | Status: DC
  Administered 2018-06-08 – 2018-06-10 (×4): 100 mg via ORAL
  Filled 2018-06-08 (×4): qty 1

## 2018-06-08 MED ORDER — SODIUM CHLORIDE FLUSH 0.9 % IV SOLN
INTRAVENOUS | Status: AC
  Filled 2018-06-08: qty 40

## 2018-06-08 MED ORDER — LIDOCAINE HCL (CARDIAC) PF 100 MG/5ML IV SOSY
PREFILLED_SYRINGE | INTRAVENOUS | Status: DC | PRN
  Administered 2018-06-08: 100 mg via INTRAVENOUS

## 2018-06-08 MED ORDER — SODIUM CHLORIDE 0.9 % IR SOLN
Status: DC | PRN
  Administered 2018-06-08: 12:00:00

## 2018-06-08 MED ORDER — ASPIRIN 81 MG PO CHEW
81.0000 mg | CHEWABLE_TABLET | Freq: Two times a day (BID) | ORAL | Status: DC
  Administered 2018-06-08 – 2018-06-10 (×4): 81 mg via ORAL
  Filled 2018-06-08 (×4): qty 1

## 2018-06-08 MED ORDER — MIDAZOLAM HCL 2 MG/2ML IJ SOLN
INTRAMUSCULAR | Status: AC
  Administered 2018-06-08: 1 mg via INTRAVENOUS
  Filled 2018-06-08: qty 2

## 2018-06-08 MED ORDER — MENTHOL 3 MG MT LOZG
1.0000 | LOZENGE | OROMUCOSAL | Status: DC | PRN
  Filled 2018-06-08 (×2): qty 9

## 2018-06-08 MED ORDER — SODIUM CHLORIDE FLUSH 0.9 % IV SOLN
INTRAVENOUS | Status: AC
  Filled 2018-06-08: qty 20

## 2018-06-08 MED ORDER — ONDANSETRON HCL 4 MG/2ML IJ SOLN: 4.0000 mg | Freq: Four times a day (QID) | INTRAMUSCULAR | Status: DC | PRN

## 2018-06-08 MED ORDER — LIDOCAINE HCL (PF) 2 % IJ SOLN
INTRAMUSCULAR | Status: AC
  Filled 2018-06-08: qty 10

## 2018-06-08 MED ORDER — METOCLOPRAMIDE HCL 5 MG/ML IJ SOLN: 5.0000 mg | Freq: Three times a day (TID) | INTRAMUSCULAR | Status: DC | PRN

## 2018-06-08 MED ORDER — MAGNESIUM CITRATE PO SOLN
1.0000 | Freq: Once | ORAL | Status: DC | PRN
  Filled 2018-06-08: qty 296

## 2018-06-08 MED ORDER — FAMOTIDINE 20 MG PO TABS
ORAL_TABLET | ORAL | Status: AC
  Administered 2018-06-08: 20 mg via ORAL
  Filled 2018-06-08: qty 1

## 2018-06-08 MED ORDER — BISACODYL 10 MG RE SUPP
10.0000 mg | Freq: Every day | RECTAL | Status: DC | PRN
  Administered 2018-06-10: 10 mg via RECTAL
  Filled 2018-06-08: qty 1

## 2018-06-08 MED ORDER — LIDOCAINE HCL (PF) 1 % IJ SOLN
INTRAMUSCULAR | Status: DC | PRN
  Administered 2018-06-08: 5 mL via SUBCUTANEOUS

## 2018-06-08 MED ORDER — HYDROCODONE-ACETAMINOPHEN 7.5-325 MG PO TABS
1.0000 | ORAL_TABLET | ORAL | Status: DC | PRN
  Administered 2018-06-09 – 2018-06-10 (×2): 2 via ORAL
  Filled 2018-06-08 (×2): qty 2

## 2018-06-08 MED ORDER — FENTANYL CITRATE (PF) 250 MCG/5ML IJ SOLN
INTRAMUSCULAR | Status: AC
  Filled 2018-06-08: qty 5

## 2018-06-08 MED ORDER — CHLORHEXIDINE GLUCONATE 4 % EX LIQD: 60.0000 mL | Freq: Once | CUTANEOUS | Status: DC

## 2018-06-08 MED ORDER — MAGNESIUM HYDROXIDE 400 MG/5ML PO SUSP: 30.0000 mL | Freq: Every day | ORAL | Status: DC | PRN

## 2018-06-08 MED ORDER — PROPOFOL 10 MG/ML IV BOLUS
INTRAVENOUS | Status: AC
  Filled 2018-06-08: qty 20

## 2018-06-08 MED ORDER — VITAMIN B-12 1000 MCG PO TABS
1000.0000 ug | ORAL_TABLET | Freq: Every day | ORAL | Status: DC
  Administered 2018-06-09 – 2018-06-10 (×2): 1000 ug via ORAL
  Filled 2018-06-08 (×2): qty 1

## 2018-06-08 MED ORDER — ROPIVACAINE HCL 5 MG/ML IJ SOLN
INTRAMUSCULAR | Status: AC
  Filled 2018-06-08: qty 30

## 2018-06-08 MED ORDER — SODIUM CHLORIDE 0.9 % IV SOLN
INTRAVENOUS | Status: DC | PRN
  Administered 2018-06-08: 60 mL

## 2018-06-08 MED ORDER — FLUTICASONE PROPIONATE 50 MCG/ACT NA SUSP
1.0000 | Freq: Every day | NASAL | Status: DC
  Administered 2018-06-09 – 2018-06-10 (×2): 1 via NASAL
  Filled 2018-06-08: qty 16

## 2018-06-08 MED ORDER — ONDANSETRON HCL 4 MG/2ML IJ SOLN: 4.0000 mg | Freq: Once | INTRAMUSCULAR | Status: DC | PRN

## 2018-06-08 MED ORDER — SUGAMMADEX SODIUM 200 MG/2ML IV SOLN
INTRAVENOUS | Status: AC
  Filled 2018-06-08: qty 2

## 2018-06-08 MED ORDER — BUPIVACAINE LIPOSOME 1.3 % IJ SUSP
INTRAMUSCULAR | Status: AC
  Filled 2018-06-08: qty 20

## 2018-06-08 MED ORDER — METOCLOPRAMIDE HCL 10 MG PO TABS: 5.0000 mg | ORAL_TABLET | Freq: Three times a day (TID) | ORAL | Status: DC | PRN

## 2018-06-08 MED ORDER — LORATADINE 10 MG PO TABS
10.0000 mg | ORAL_TABLET | Freq: Every day | ORAL | Status: DC
  Administered 2018-06-09 – 2018-06-10 (×2): 10 mg via ORAL
  Filled 2018-06-08 (×2): qty 1

## 2018-06-08 MED ORDER — MIDAZOLAM HCL 2 MG/2ML IJ SOLN
1.0000 mg | Freq: Once | INTRAMUSCULAR | Status: AC
  Administered 2018-06-08: 1 mg via INTRAVENOUS

## 2018-06-08 MED ORDER — FENTANYL CITRATE (PF) 100 MCG/2ML IJ SOLN: 25.0000 ug | INTRAMUSCULAR | Status: DC | PRN

## 2018-06-08 MED ORDER — FENTANYL CITRATE (PF) 100 MCG/2ML IJ SOLN
50.0000 ug | Freq: Once | INTRAMUSCULAR | Status: AC
  Administered 2018-06-08: 50 ug via INTRAVENOUS

## 2018-06-08 MED ORDER — ROCURONIUM BROMIDE 50 MG/5ML IV SOLN
INTRAVENOUS | Status: AC
  Filled 2018-06-08: qty 1

## 2018-06-08 MED ORDER — GABAPENTIN 400 MG PO CAPS
1200.0000 mg | ORAL_CAPSULE | Freq: Three times a day (TID) | ORAL | Status: DC
  Administered 2018-06-08: 1200 mg via ORAL
  Filled 2018-06-08 (×2): qty 3

## 2018-06-08 MED ORDER — DEXAMETHASONE SODIUM PHOSPHATE 10 MG/ML IJ SOLN
INTRAMUSCULAR | Status: DC | PRN
  Administered 2018-06-08: 10 mg via INTRAVENOUS

## 2018-06-08 MED ORDER — VITAMIN D 25 MCG (1000 UNIT) PO TABS
1000.0000 [IU] | ORAL_TABLET | Freq: Every day | ORAL | Status: DC
  Administered 2018-06-09 – 2018-06-10 (×2): 1000 [IU] via ORAL
  Filled 2018-06-08 (×2): qty 1

## 2018-06-08 MED ORDER — MIRABEGRON ER 25 MG PO TB24
25.0000 mg | ORAL_TABLET | Freq: Every day | ORAL | Status: DC
  Administered 2018-06-08 – 2018-06-10 (×3): 25 mg via ORAL
  Filled 2018-06-08 (×3): qty 1

## 2018-06-08 MED ORDER — TRAMADOL HCL 50 MG PO TABS
50.0000 mg | ORAL_TABLET | Freq: Four times a day (QID) | ORAL | Status: DC
  Administered 2018-06-08 – 2018-06-10 (×3): 50 mg via ORAL
  Filled 2018-06-08 (×5): qty 1

## 2018-06-08 MED ORDER — MORPHINE SULFATE (PF) 2 MG/ML IV SOLN: 0.5000 mg | INTRAVENOUS | Status: DC | PRN

## 2018-06-08 MED ORDER — CEFAZOLIN SODIUM-DEXTROSE 2-4 GM/100ML-% IV SOLN
2.0000 g | Freq: Four times a day (QID) | INTRAVENOUS | Status: AC
  Administered 2018-06-08: 2 g via INTRAVENOUS
  Filled 2018-06-08 (×2): qty 100

## 2018-06-08 MED ORDER — LISINOPRIL 5 MG PO TABS
2.5000 mg | ORAL_TABLET | Freq: Two times a day (BID) | ORAL | Status: DC
  Administered 2018-06-08 – 2018-06-10 (×3): 2.5 mg via ORAL
  Filled 2018-06-08 (×3): qty 1

## 2018-06-08 MED ORDER — KETOROLAC TROMETHAMINE 15 MG/ML IJ SOLN
7.5000 mg | Freq: Four times a day (QID) | INTRAMUSCULAR | Status: AC
  Administered 2018-06-08: 7.5 mg via INTRAVENOUS
  Filled 2018-06-08 (×2): qty 1

## 2018-06-08 MED ORDER — KETAMINE HCL 50 MG/ML IJ SOLN
INTRAMUSCULAR | Status: DC | PRN
  Administered 2018-06-08: 50 mg via INTRAMUSCULAR

## 2018-06-08 MED ORDER — SODIUM CHLORIDE FLUSH 0.9 % IV SOLN
INTRAVENOUS | Status: AC
  Filled 2018-06-08: qty 10

## 2018-06-08 SURGICAL SUPPLY — 73 items
ADAPTER BOLT FEMORAL +2/-2 (Knees) ×2 IMPLANT
AUGMENT POST 5 8 (Orthopedic Implant) ×4 IMPLANT
BLADE SAW 90X13X1.19 OSCILLAT (BLADE) ×2 IMPLANT
BLADE SAW 90X25X1.19 OSCILLAT (BLADE) ×2 IMPLANT
BLADE SURG SZ10 CARB STEEL (BLADE) ×4 IMPLANT
BOWL CEMENT MIXING ADV NOZZLE (MISCELLANEOUS) ×2 IMPLANT
BRUSH SCRUB EZ  4% CHG (MISCELLANEOUS) ×2
BRUSH SCRUB EZ 4% CHG (MISCELLANEOUS) ×2 IMPLANT
CANISTER SUCT 1200ML W/VALVE (MISCELLANEOUS) ×2 IMPLANT
CANISTER SUCT 3000ML PPV (MISCELLANEOUS) ×4 IMPLANT
CEMENT BONE GENTAMICIN (Cement) ×4 IMPLANT
CEMENT BONE GENTAMICIN PWDR (Cement) ×2 IMPLANT
CHLORAPREP W/TINT 26ML (MISCELLANEOUS) ×4 IMPLANT
CNTNR SPEC 2.5X3XGRAD LEK (MISCELLANEOUS) ×2
CONT SPEC 4OZ STER OR WHT (MISCELLANEOUS) ×2
CONTAINER SPEC 2.5X3XGRAD LEK (MISCELLANEOUS) ×2 IMPLANT
COOLER POLAR GLACIER W/PUMP (MISCELLANEOUS) ×2 IMPLANT
COVER WAND RF STERILE (DRAPES) ×2 IMPLANT
CUFF TOURN 24 STER (MISCELLANEOUS) IMPLANT
CUFF TOURN 30 STER DUAL PORT (MISCELLANEOUS) ×2 IMPLANT
DRAPE FLUOR MINI C-ARM 54X84 (DRAPES) ×2 IMPLANT
DRAPE INCISE IOBAN 66X60 STRL (DRAPES) ×2 IMPLANT
DRAPE SHEET LG 3/4 BI-LAMINATE (DRAPES) ×2 IMPLANT
DRSG AQUACEL AG ADV 3.5X14 (GAUZE/BANDAGES/DRESSINGS) ×2 IMPLANT
FEM TC3 PFC SIGMA SZ5 (Orthopedic Implant) ×2 IMPLANT
FEMORAL ADAPTER (Orthopedic Implant) ×2 IMPLANT
FEMORAL TC3 PFC SIGMA SZ5 (Orthopedic Implant) ×1 IMPLANT
GAUZE PETRO XEROFOAM 1X8 (MISCELLANEOUS) ×4 IMPLANT
GAUZE SPONGE 4X4 12PLY STRL (GAUZE/BANDAGES/DRESSINGS) ×2 IMPLANT
GLOVE INDICATOR 8.0 STRL GRN (GLOVE) ×2 IMPLANT
GLOVE SURG ORTHO 8.0 STRL STRW (GLOVE) ×4 IMPLANT
GOWN STRL REUS W/ TWL LRG LVL3 (GOWN DISPOSABLE) ×2 IMPLANT
GOWN STRL REUS W/ TWL XL LVL3 (GOWN DISPOSABLE) ×1 IMPLANT
GOWN STRL REUS W/TWL LRG LVL3 (GOWN DISPOSABLE) ×2
GOWN STRL REUS W/TWL XL LVL3 (GOWN DISPOSABLE) ×1
HOLDER FOLEY CATH W/STRAP (MISCELLANEOUS) IMPLANT
HOOD PEEL AWAY FLYTE STAYCOOL (MISCELLANEOUS) ×8 IMPLANT
INSERT TIB TC3 RP SZ5 10 (Knees) ×2 IMPLANT
IV NS 1000ML (IV SOLUTION) ×1
IV NS 1000ML BAXH (IV SOLUTION) ×1 IMPLANT
KIT TURNOVER KIT A (KITS) ×2 IMPLANT
LABEL OR SOLS (LABEL) ×2 IMPLANT
MAT ABSORB  FLUID 56X50 GRAY (MISCELLANEOUS) ×1
MAT ABSORB FLUID 56X50 GRAY (MISCELLANEOUS) ×1 IMPLANT
NDL SAFETY ECLIPSE 18X1.5 (NEEDLE) ×1 IMPLANT
NEEDLE HYPO 18GX1.5 SHARP (NEEDLE) ×1
NEEDLE SPNL 20GX3.5 QUINCKE YW (NEEDLE) ×2 IMPLANT
NS IRRIG 1000ML POUR BTL (IV SOLUTION) ×2 IMPLANT
PACK TOTAL KNEE (MISCELLANEOUS) ×2 IMPLANT
PAD ABD DERMACEA PRESS 5X9 (GAUZE/BANDAGES/DRESSINGS) ×10 IMPLANT
PAD CAST CTTN 4X4 STRL (SOFTGOODS) ×1 IMPLANT
PAD DE MAYO PRESSURE PROTECT (MISCELLANEOUS) ×4 IMPLANT
PAD WRAPON POLAR KNEE (MISCELLANEOUS) ×1 IMPLANT
PADDING CAST COTTON 4X4 STRL (SOFTGOODS) ×1
PULSAVAC PLUS IRRIG FAN TIP (DISPOSABLE) ×2
SLEEVE MBT 37MM (Knees) ×2 IMPLANT
SLEEVE UNIV FEM DIST PRO SZ 31 (Sleeve) ×2 IMPLANT
SPONGE LAP 18X18 RF (DISPOSABLE) ×4 IMPLANT
STAPLER SKIN PROX 35W (STAPLE) ×2 IMPLANT
STEM UNIVERSAL REVISION 75X14 (Stem) ×4 IMPLANT
SUCTION FRAZIER HANDLE 10FR (MISCELLANEOUS) ×1
SUCTION TUBE FRAZIER 10FR DISP (MISCELLANEOUS) ×1 IMPLANT
SUT DVC 2 QUILL PDO  T11 36X36 (SUTURE) ×1
SUT DVC 2 QUILL PDO T11 36X36 (SUTURE) ×1 IMPLANT
SUT VIC AB 2-0 CT1 18 (SUTURE) ×2 IMPLANT
SUT VIC AB 2-0 CT1 27 (SUTURE) ×3
SUT VIC AB 2-0 CT1 TAPERPNT 27 (SUTURE) ×3 IMPLANT
SUT VIC AB PLUS 45CM 1-MO-4 (SUTURE) ×2 IMPLANT
SYR 30ML LL (SYRINGE) ×4 IMPLANT
TIP FAN IRRIG PULSAVAC PLUS (DISPOSABLE) ×1 IMPLANT
TRAY FOLEY MTR SLVR 16FR STAT (SET/KITS/TRAYS/PACK) IMPLANT
TRAY REVISION SZ 3 (Knees) ×2 IMPLANT
WRAPON POLAR PAD KNEE (MISCELLANEOUS) ×2

## 2018-06-08 NOTE — Anesthesia Post-op Follow-up Note (Signed)
Anesthesia QCDR form completed.        

## 2018-06-08 NOTE — Op Note (Signed)
DATE OF SURGERY:  06/08/2018 TIME: 3:02 PM  PATIENT NAME:  Roger Rodriguez   AGE: 77 y.o.    PRE-OPERATIVE DIAGNOSIS:  prosthetic joint infection  POST-OPERATIVE DIAGNOSIS:  Same  PROCEDURE:  Procedure(s): RIGHT TOTAL KNEE REVISION of Femur and Tibia, with removal of antibiotic spacer  SURGEON:  Lovell Sheehan, MD   ASSISTANT:  Carlynn Spry, PA-C  OPERATIVE IMPLANTS: J&J TC3 Femur size 5 with 8 mm posterior augments medial and lateral, 31 mm sleeve with 75 mm x 14 mm stem, RP Tibia size 3 with 37 mm sleeve and 75 mm x 14 mm stem, TC3 size 5 by 10 mm tibial insert.   PREOPERATIVE INDICATIONS:  Roger Rodriguez is an 77 y.o. 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   The risks, benefits, and alternatives were discussed at length including but not limited to the risks of infection, bleeding, nerve or blood vessel injury, knee stiffness, fracture, dislocation, loosening or failure of the hardware and the need for further surgery. Medical risks include but not limited to DVT and pulmonary embolism, myocardial infarction, stroke, pneumonia, respiratory failure and death. I discussed these risks with the patient in my office prior to the date of surgery. They understood these risks and were willing to proceed.  OPERATIVE FINDINGS AND UNIQUE ASPECTS OF THE CASE:  The spacer was in good condition without failure. Moderate bone loss was noted along the proximal tibia and severe bone loss involving the patella. A decision was made to debride the patella and not perform a resurfacing with a cemented component.  OPERATIVE DESCRIPTION:  The patient was brought to the operative room and placed in a supine position after undergoing placement of a general anesthetic. IV antibiotics were given after  intraoperative cultures were obtained. Patient received tranexamic acid during cementation of the components. The lower extremity was prepped and draped in the usual sterile fashion.  A time out was performed to verify the patient's name, date of birth, medical record number, correct site of surgery and correct procedure to be performed. The timeout was also used to confirm the patient received antibiotics and that appropriate instruments, implants and radiographs studies were available in the room.  The leg was elevated and exsanguinated with an Esmarch and the tourniquet was inflated to 275 mmHg.  A midline incision was made over the left knee.. A medial parapatellar arthrotomy was then made and the patella subluxed laterally and the knee was brought into 90 of flexion.Extensive scar tissue from the medial and lateral gutters was excised. A medial peel was performed.  The tibia was reamed and broached. The size was noted to be a 3. The femur was reamed and broached. The size 5 fit appropriately with a -2 mm offset. An additional 8 mm augment was required along both the medial and lateral posterior condyles. The notch was cut for the TC3 component.  The 10 mm insert fit appropriately with excellent medial/lateral stability and full extension. The knee flexed to 110 degrees.   All trial components were then removed.  The joint was copiously irrigated with pulse lavage.  A separate lateral incision was made and a portion of the cement from the index procedure was dissected free and passed from the field.  The final total knee arthroplasty components were assembled on the back table  and then impacted in to position. Cement was placed along the femoral and tibial components with care to prevent any cement on the press-fit sleeves. The knee was held in extension while cement was allowed to cure.The knee was taken through a range of motion and the patella tracked well and the knee was again irrigated  copiously.  The knee capsule was then injected with Exparel.  The medial arthrotomy was closed with #1 Vicryl and #2 Quill. The subcutaneous tissue for both incisions was closed with  2-0 vicryl, and skin approximated with staples.  A dry sterile and compressive dressing was applied.  A Polar Care was applied to the operative knee.  The patient was awakened and brought to the PACU in stable and satisfactory condition.  All sharp, lap and instrument counts were correct at the conclusion the case. I spoke with the patient's family in the postop consultation room to let them know the case had been performed without complication and the patient was stable in recovery room.   Total tourniquet time was 113 minutes.

## 2018-06-08 NOTE — Transfer of Care (Signed)
Immediate Anesthesia Transfer of Care Note  Patient: Roger Rodriguez  Procedure(s) Performed: TOTAL KNEE REVISION (Right Knee)  Patient Location: PACU  Anesthesia Type:General  Level of Consciousness: unresponsive  Airway & Oxygen Therapy: Patient Spontanous Breathing and Patient connected to face mask oxygen  Post-op Assessment: Report given to RN and Post -op Vital signs reviewed and stable  Post vital signs: Reviewed and stable  Last Vitals:  Vitals Value Taken Time  BP 112/76 06/08/2018  3:48 PM  Temp 36.9 C 06/08/2018  3:45 PM  Pulse 92 06/08/2018  3:50 PM  Resp 11 06/08/2018  3:50 PM  SpO2 97 % 06/08/2018  3:50 PM  Vitals shown include unvalidated device data.  Last Pain:  Vitals:   06/08/18 0941  TempSrc: Temporal  PainSc: 6          Complications: No apparent anesthesia complications

## 2018-06-08 NOTE — Anesthesia Preprocedure Evaluation (Signed)
Anesthesia Evaluation  Patient identified by MRN, date of birth, ID band Patient awake    Reviewed: Allergy & Precautions, NPO status , Patient's Chart, lab work & pertinent test results  History of Anesthesia Complications (+) Family history of anesthesia reaction  Airway Mallampati: III       Dental  (+) Dental Advidsory Given   Pulmonary neg shortness of breath, neg sleep apnea, neg COPD, former smoker,           Cardiovascular hypertension, Pt. on medications (-) angina(-) Past MI and (-) CHF (-) dysrhythmias (-) Valvular Problems/Murmurs     Neuro/Psych neg Seizures PSYCHIATRIC DISORDERS Depression  Neuromuscular disease    GI/Hepatic neg GERD  ,(+) Hepatitis -, A  Endo/Other  neg diabetes  Renal/GU Renal InsufficiencyRenal disease     Musculoskeletal   Abdominal   Peds  Hematology   Anesthesia Other Findings Past Medical History: No date: Allergy     Comment:  allergic rhinitis No date: Arthritis     Comment:  OA No date: Chronic kidney disease     Comment:  stones No date: Depression No date: Family history of adverse reaction to anesthesia No date: Hematuria No date: History of kidney stones No date: Hyperlipidemia No date: Hypertension     Comment:  no meds since parkinsons meds started No date: Incontinence of urine No date: Inguinal hernia No date: Neuromuscular disorder ([place])     Comment:  parkinsons No date: Overactive bladder No date: Parkinson's disease (HCC) No date: Prostate cancer ([place]) No date: Rhinitis, allergic No date: Shortness of breath dyspnea     Comment:  doe secondary to parkinsons   Reproductive/Obstetrics                             Anesthesia Physical  Anesthesia Plan  ASA: III  Anesthesia Plan: General   Post-op Pain Management:  Regional for Post-op pain   Induction: Intravenous  PONV Risk Score and Plan: 2 and Ondansetron,  Dexamethasone and Treatment may vary due to age or medical condition  Airway Management Planned: Oral ETT  Additional Equipment:   Intra-op Plan:   Post-operative Plan: Extubation in OR  Informed Consent: I have reviewed the patients History and Physical, chart, labs and discussed the procedure including the risks, benefits and alternatives for the proposed anesthesia with the patient or authorized representative who has indicated his/her understanding and acceptance.     Plan Discussed with:   Anesthesia Plan Comments:         Anesthesia Quick Evaluation

## 2018-06-08 NOTE — Anesthesia Procedure Notes (Signed)
Procedure Name: Intubation Date/Time: 06/08/2018 11:26 AM Performed by: Aline Brochure, CRNA Pre-anesthesia Checklist: Patient identified, Emergency Drugs available, Suction available and Patient being monitored Patient Re-evaluated:Patient Re-evaluated prior to induction Oxygen Delivery Method: Circle system utilized Preoxygenation: Pre-oxygenation with 100% oxygen Induction Type: IV induction Ventilation: Mask ventilation without difficulty Laryngoscope Size: Mac and 4 Grade View: Grade III Tube type: Oral Tube size: 7.5 mm Number of attempts: 2 Airway Equipment and Method: Stylet and LTA kit utilized Placement Confirmation: ETT inserted through vocal cords under direct vision,  positive ETCO2 and breath sounds checked- equal and bilateral Secured at: 25 cm Tube secured with: Tape Dental Injury: Teeth and Oropharynx as per pre-operative assessment  Difficulty Due To: Difficult Airway- due to anterior larynx

## 2018-06-08 NOTE — Anesthesia Procedure Notes (Signed)
Anesthesia Regional Block: Adductor canal block   Pre-Anesthetic Checklist: ,, timeout performed, Correct Patient, Correct Site, Correct Laterality, Correct Procedure, Correct Position, site marked, Risks and benefits discussed,  Surgical consent,  Pre-op evaluation,  At surgeon's request and post-op pain management  Laterality: Lower and Right  Prep: chloraprep       Needles:  Injection technique: Single-shot  Needle Type: Echogenic Needle     Needle Length: 10cm  Needle Gauge: 22     Additional Needles:   Procedures:,,,, ultrasound used (permanent image in chart),,,,  Narrative:  Start time: 06/08/2018 10:42 AM End time: 06/08/2018 10:50 AM Injection made incrementally with aspirations every 5 mL.  Performed by: Personally  Anesthesiologist: Martha Clan, MD  Additional Notes: Functioning IV was confirmed and monitors were applied.  A echogenic needle was used. Sterile prep,hand hygiene and sterile gloves were used. Minimal sedation used for procedure.   No paresthesia endorsed by patient during the procedure.  Negative aspiration and negative test dose prior to incremental administration of local anesthetic. The patient tolerated the procedure well with no immediate complications.

## 2018-06-08 NOTE — OR Nursing (Signed)
Instructed with use of incentive spirometer with return demonstration.

## 2018-06-08 NOTE — H&P (Signed)
The patient has been re-examined, and the chart reviewed, and there have been no interval changes to the documented history and physical.  Plan a right knee revision with removal of spacer and placement of total knee arthroplasty today.  Anesthesia is consulted regarding a peripheral nerve block for post-operative pain.  The risks, benefits, and alternatives have been discussed at length, and the patient is willing to proceed.

## 2018-06-09 LAB — CBC
HCT: 31.4 % — ABNORMAL LOW (ref 39.0–52.0)
Hemoglobin: 9.7 g/dL — ABNORMAL LOW (ref 13.0–17.0)
MCH: 29.8 pg (ref 26.0–34.0)
MCHC: 30.9 g/dL (ref 30.0–36.0)
MCV: 96.3 fL (ref 80.0–100.0)
Platelets: 308 10*3/uL (ref 150–400)
RBC: 3.26 MIL/uL — ABNORMAL LOW (ref 4.22–5.81)
RDW: 14.7 % (ref 11.5–15.5)
WBC: 10.4 10*3/uL (ref 4.0–10.5)
nRBC: 0 % (ref 0.0–0.2)

## 2018-06-09 LAB — GLUCOSE, CAPILLARY
Glucose-Capillary: 264 mg/dL — ABNORMAL HIGH (ref 70–99)
Glucose-Capillary: 242 mg/dL — ABNORMAL HIGH (ref 70–99)
Glucose-Capillary: 204 mg/dL — ABNORMAL HIGH (ref 70–99)

## 2018-06-09 LAB — BASIC METABOLIC PANEL
Anion gap: 9 (ref 5–15)
BUN: 25 mg/dL — ABNORMAL HIGH (ref 8–23)
CO2: 26 mmol/L (ref 22–32)
Calcium: 8.2 mg/dL — ABNORMAL LOW (ref 8.9–10.3)
Chloride: 98 mmol/L (ref 98–111)
Creatinine, Ser: 1.21 mg/dL (ref 0.61–1.24)
GFR calc Af Amer: >60 mL/min (ref >60)
GFR calc non Af Amer: 57 mL/min — ABNORMAL LOW (ref >60)
Glucose, Bld: 261 mg/dL — ABNORMAL HIGH (ref 70–99)
Potassium: 4.8 mmol/L (ref 3.5–5.1)
Sodium: 133 mmol/L — ABNORMAL LOW (ref 135–145)

## 2018-06-09 MED ORDER — INSULIN ASPART 100 UNIT/ML ~~LOC~~ SOLN
0.0000 [IU] | Freq: Three times a day (TID) | SUBCUTANEOUS | Status: DC
  Administered 2018-06-09: 5 [IU] via SUBCUTANEOUS
  Administered 2018-06-09: 8 [IU] via SUBCUTANEOUS
  Administered 2018-06-10 (×2): 2 [IU] via SUBCUTANEOUS
  Administered 2018-06-10: 3 [IU] via SUBCUTANEOUS
  Filled 2018-06-09 (×5): qty 1

## 2018-06-09 MED ORDER — GABAPENTIN 400 MG PO CAPS
400.0000 mg | ORAL_CAPSULE | Freq: Three times a day (TID) | ORAL | Status: DC
  Administered 2018-06-09 – 2018-06-10 (×4): 400 mg via ORAL
  Filled 2018-06-09 (×4): qty 1

## 2018-06-09 NOTE — Progress Notes (Signed)
Subjective:  Patient reports pain as mild.  No other complaints.  Objective:   VITALS:   Vitals:   06/08/18 2230 06/08/18 2336 06/09/18 0303 06/09/18 0726  BP: 112/70 117/74 94/61 103/68  Pulse: 94 87 85 81  Resp: 18 18  16   Temp: 97.8 F (36.6 C) 97.7 F (36.5 C)  97.7 F (36.5 C)  TempSrc: Oral Oral  Oral  SpO2: 98% 97% 98% 99%  Weight:      Height:        PHYSICAL EXAM:  Neurovascular intact Dorsiflexion/Plantar flexion intact Incision: dressing C/D/I Compartment soft  LABS  Results for orders placed or performed during the hospital encounter of 06/08/18 (from the past 24 hour(s))  Aerobic/Anaerobic Culture (surgical/deep wound)     Status: None (Preliminary result)   Collection Time: 06/08/18 12:06 PM  Result Value Ref Range   Specimen Description      TISSUE RIGHT KNEE TISSUE Performed at [facility], [address], [place], [place] [address]    Special Requests      NONE Performed at [facility], [address], [place], [place] [address]    Gram Stain      MODERATE WBC PRESENT, PREDOMINANTLY MONONUCLEAR FEW GRAM NEGATIVE RODS Performed at [facility], [place] [address] [place],  [address]    Culture PENDING    Report Status PENDING   CBC     Status: Abnormal   Collection Time: 06/09/18  4:58 AM  Result Value Ref Range   WBC 10.4 4.0 - 10.5 K/uL   RBC 3.26 (L) 4.22 - 5.81 MIL/uL   Hemoglobin 9.7 (L) 13.0 - 17.0 g/dL   HCT 31.4 (L) 39.0 - 52.0 %   MCV 96.3 80.0 - 100.0 fL   MCH 29.8 26.0 - 34.0 pg   MCHC 30.9 30.0 - 36.0 g/dL   RDW 14.7 11.5 - 15.5 %   Platelets 308 150 - 400 K/uL   nRBC 0.0 0.0 - 0.2 %  Basic metabolic panel     Status: Abnormal   Collection Time: 06/09/18  4:58 AM  Result Value Ref Range   Sodium 133 (L) 135 - 145 mmol/L   Potassium 4.8 3.5 - 5.1 mmol/L   Chloride 98 98 - 111 mmol/L   CO2 26 22 - 32 mmol/L   Glucose, Bld 261 (H) 70 - 99 mg/dL   BUN 25 (H) 8 - 23 mg/dL   Creatinine, Ser  1.21 0.61 - 1.24 mg/dL   Calcium 8.2 (L) 8.9 - 10.3 mg/dL   GFR calc non Af Amer 57 (L) >60 mL/min   GFR calc Af Amer >60 >60 mL/min   Anion gap 9 5 - 15    Dg Knee Right Port  Result Date: 06/08/2018 CLINICAL DATA:  Status post right knee arthroplasty revision. EXAM: PORTABLE RIGHT KNEE - 1-2 VIEW COMPARISON:  11/12/2017 FINDINGS: Right knee revision arthroplasty has been performed. No periprosthetic fracture or dislocation. The hardware components appear to be in anatomic alignment. Gas identified within the surrounding soft tissues. Anterior skin staples identified. IMPRESSION: 1. Status post revision arthroplasty of the right knee. No complications. Electronically Signed   By: Kerby Moors M.D.   On: 06/08/2018 15:59    Assessment/Plan: 1 Day Post-Op   Active Problems:   S/P revision of total knee, right   Up with therapy D/C IV fluids  Discharge planning, likely SNF placement on Monday Added sliding scale for elevated glucose   Lovell Sheehan ,  MD 06/09/2018, 10:30 AM

## 2018-06-09 NOTE — Clinical Social Work Note (Signed)
Clinical Social Work Assessment  Patient Details  Name: Roger Rodriguez MRN: [id number] Date of Birth: 22-May-1941  Date of referral:  06/09/18               Reason for consult:  Facility Placement                Permission sought to share information with:  Family Supports, Customer service manager Permission granted to share information::  Yes, Verbal Permission Granted  Name::     [place], [place] [phone] or Jakarius, [place] [phone]   Agency::  SNF admissions  Relationship::     Contact Information:     Housing/Transportation Living arrangements for the past 2 months:  Single Family Home Source of Information:  Patient Patient Interpreter Needed:  None Criminal Activity/Legal Involvement Pertinent to Current Situation/Hospitalization:  No - Comment as needed Significant Relationships:  Adult Children Lives with:  Self Do you feel safe going back to the place where you live?  No Need for family participation in patient care:  No (Coment)  Care giving concerns: Patient feels that he needs some short term rehab before he is able to return back home.   Social Worker assessment / plan:  Patient is a 77 year old male who is alert and oriented x4.  Patient states he has been at SNF before, and was at [facility] in the past.  Patient stated he would like to return to [facility] if possible.  CSW introduced himself, and explained role and process of looking for short term rehab.  Patient was explained how insurance will pay for stay at SNF, and what to expect.  Patient stated he was receiving home health, and it was going well, but then had to have surgery again.  Patient did not express any other questions and gave CSW permission to begin bed search in [facility].  Employment status:  Retired Nurse, adult PT Recommendations:  [place] / Referral to community resources:  [place]  Patient/Family's Response to care:  Patient is in agreement to going to SNF for short term rehab. Patient/Family's Understanding of and Emotional Response to Diagnosis, Current Treatment, and Prognosis:  Patient is hopeful that he will improve enough to go home with home health, but if not he is understanding that he would need to go to SNF.  Emotional Assessment Appearance:  Appears stated age Attitude/Demeanor/Rapport:    Affect (typically observed):  Appropriate, Stable, Calm Orientation:  Oriented to Self, Oriented to Place, Oriented to  Time, Oriented to Situation Alcohol / Substance use:  Not Applicable Psych involvement (Current and /or in the community):  No (Comment)  Discharge Needs  Concerns to be addressed:  Care Coordination, Lack of Support Readmission within the last 30 days:  No Current discharge risk:  Lack of support system, Lives alone Barriers to Discharge:  Continued Medical Work up, [employer]   Ross Ludwig, [place] 06/09/2018, 4:07 PM

## 2018-06-09 NOTE — NC FL2 (Signed)
[place] LEVEL OF CARE SCREENING TOOL     IDENTIFICATION  Patient Name: Roger Rodriguez Birthdate: June 15, 1941 Sex: male Admission Date (Current Location): 06/08/2018  [place] and [place] Number:  Engineering geologist and Address:  [facility], [address], [place], [place] [address]      Provider Number: [id number]  Attending Physician Name and Address:  Lovell Sheehan, MD  Relative Name and Phone Number:  [place], Deis [phone] or [place], Marney [phone]     Current Level of Care: Hospital Recommended Level of Care: [place] Prior Approval Number:    Date Approved/Denied:   PASRR Number: [id number]  Discharge Plan: SNF    Current Diagnoses: Patient Active Problem List   Diagnosis Date Noted  . S/P revision of total knee, 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  . 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[place]) 03/17/2018  . Prosthetic joint infection ([place]) 03/16/2018  . Chronic venous insufficiency 11/22/2017  . ARF (acute renal failure) ([place]) 10/12/2017  . RUQ pain 10/22/2016  . Depression, major, single episode, moderate ([place]) 10/22/2016  . Renal cyst, right 09/28/2016  . Hepatic steatosis 09/28/2016  . Parkinson's disease ([place]) 09/18/2015  . Restless leg 09/18/2015  . Malignant neoplasm of prostate ([place]) 05/22/2015  . Hepatitis A 04/02/2015  . Allergic rhinitis 04/02/2015  . Arthritis 04/02/2015  . Narrowing of intervertebral disc space 04/02/2015  . 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 Accumulation of fluid in tissues 04/02/2015  . Essential (primary) hypertension 04/02/2015  . Borderline diabetes 04/02/2015  . Hyperlipidemia 04/02/2015  . BP (high blood pressure) 04/02/2015  . Infected sebaceous cyst 04/02/2015  . Hernia, inguinal 04/02/2015  . Leg pain 04/02/2015  . Lumbar canal stenosis 04/02/2015  . 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 Malignant melanoma ([place]) 04/02/2015  . Arthritis,  degenerative 04/02/2015  . Fatigue 04/02/2015  . Idiopathic Parkinson's disease ([place]) 04/02/2015  . Plantar fasciitis 04/02/2015  . CA of prostate ([place]) 04/02/2015  . Benign essential tremor 04/02/2015  . Lower urinary tract infection 04/02/2015  . Degeneration of intervertebral disc of lumbosacral region 05/10/2012    Orientation RESPIRATION BLADDER Height & Weight     Self, Situation, Place, Time  Normal Continent Weight: 210 lb 1.6 oz (95.3 kg) Height:  5\' 11"  (180.3 cm)  BEHAVIORAL SYMPTOMS/MOOD NEUROLOGICAL BOWEL NUTRITION STATUS      Continent Diet(Regular diet)  AMBULATORY STATUS COMMUNICATION OF NEEDS Skin   Limited Assist Verbally Surgical wounds                       Personal Care Assistance Level of Assistance  Bathing, Feeding, Dressing Bathing Assistance: Limited assistance Feeding assistance: Limited assistance Dressing Assistance: Limited assistance     Functional Limitations Info  Hearing, Sight, Speech Sight Info: Adequate Hearing Info: Adequate Speech Info: Adequate    SPECIAL CARE FACTORS FREQUENCY  PT (By licensed PT)     PT Frequency: 5x a week              Contractures Contractures Info: Not present    Additional Factors Info  Allergies, Insulin Sliding Scale   Allergies Info: SULFA ANTIBIOTICS    Insulin Sliding Scale Info: insulin aspart (novoLOG) injection 0-15 Units 3x a day with meals       Current Medications (06/09/2018):  This is the current hospital active medication list Current Facility-Administered Medications  Medication Dose Route Frequency Provider Last Rate Last Dose  . acetaminophen (TYLENOL) tablet 325-650  mg  325-650 mg Oral Q6H PRN Lovell Sheehan, MD      . acetaminophen (TYLENOL) tablet 500 mg  500 mg Oral Q6H Lovell Sheehan, MD   500 mg at 06/09/18 1228  . aspirin chewable tablet 81 mg  81 mg Oral BID Lovell Sheehan, MD   81 mg at 06/09/18 0850  . bisacodyl (DULCOLAX) suppository 10 mg  10 mg Rectal  Daily PRN Lovell Sheehan, MD      . Carbidopa-Levodopa ER 23.75-95 MG CPCR 3 capsule  3 capsule Oral QID [place], MD   3 capsule at 06/09/18 1400  . cholecalciferol (VITAMIN D3) tablet 1,000 Units  1,000 Units Oral Daily Lovell Sheehan, MD   1,000 Units at 06/09/18 0849  . docusate sodium (COLACE) capsule 100 mg  100 mg Oral BID Lovell Sheehan, MD   100 mg at 06/09/18 0850  . fluticasone (FLONASE) 50 MCG/ACT nasal spray 1 spray  1 spray Each Nare Daily Lovell Sheehan, MD   1 spray at 06/09/18 0900  . gabapentin (NEURONTIN) capsule 400 mg  400 mg Oral TID Lovell Sheehan, MD   400 mg at 06/09/18 1501  . HYDROcodone-acetaminophen (NORCO) 7.5-325 MG per tablet 1-2 tablet  1-2 tablet Oral Q4H PRN Lovell Sheehan, MD      . HYDROcodone-acetaminophen (NORCO/VICODIN) 5-325 MG per tablet 1-2 tablet  1-2 tablet Oral Q4H PRN Lovell Sheehan, MD   1 tablet at 06/09/18 1513  . insulin aspart (novoLOG) injection 0-15 Units  0-15 Units Subcutaneous TID WC Lovell Sheehan, MD   8 Units at 06/09/18 1228  . ketorolac (TORADOL) 15 MG/ML injection 7.5 mg  7.5 mg Intravenous Q6H Lovell Sheehan, MD   7.5 mg at 06/08/18 2326  . lactated ringers infusion   Intravenous Continuous Lovell Sheehan, MD 75 mL/hr at 06/08/18 2334    . lisinopril (PRINIVIL,ZESTRIL) tablet 2.5 mg  2.5 mg Oral BID Lovell Sheehan, MD   2.5 mg at 06/08/18 2102  . loratadine (CLARITIN) tablet 10 mg  10 mg Oral Daily Lovell Sheehan, MD   10 mg at 06/09/18 0850  . magnesium citrate solution 1 Bottle  1 Bottle Oral Once PRN Lovell Sheehan, MD      . magnesium hydroxide (MILK OF MAGNESIA) suspension 30 mL  30 mL Oral Daily PRN Lovell Sheehan, MD      . menthol-cetylpyridinium (CEPACOL) lozenge 3 mg  1 lozenge Oral PRN Lovell Sheehan, MD       Or  . phenol (CHLORASEPTIC) mouth spray 1 spray  1 spray Mouth/Throat PRN Lovell Sheehan, MD      . metoCLOPramide (REGLAN) tablet 5-10 mg  5-10 mg Oral Q8H PRN Lovell Sheehan, MD       Or  .  metoCLOPramide (REGLAN) injection 5-10 mg  5-10 mg Intravenous Q8H PRN Lovell Sheehan, MD      . mirabegron ER [facility]) tablet 25 mg  25 mg Oral Daily Lovell Sheehan, MD   25 mg at 06/09/18 0859  . morphine 2 MG/ML injection 0.5-1 mg  0.5-1 mg Intravenous Q2H PRN Lovell Sheehan, MD      . ondansetron [facility]) tablet 4 mg  4 mg Oral Q6H PRN Lovell Sheehan, MD       Or  . ondansetron [facility]) injection 4 mg  4 mg Intravenous Q6H PRN Lovell Sheehan, MD      .  polyethylene glycol (MIRALAX / GLYCOLAX) packet 17 g  17 g Oral Daily Lovell Sheehan, MD   17 g at 06/09/18 0859  . polyvinyl alcohol (LIQUIFILM TEARS) 1.4 % ophthalmic solution 1 drop  1 drop Both Eyes QID PRN Lovell Sheehan, MD      . pramipexole (MIRAPEX) tablet 1 mg  1 mg Oral QHS Lovell Sheehan, MD   1 mg at 06/08/18 2101  . traMADol (ULTRAM) tablet 50 mg  50 mg Oral Q6H Lovell Sheehan, MD   50 mg at 06/09/18 1501  . vitamin B-12 (CYANOCOBALAMIN) tablet 1,000 mcg  1,000 mcg Oral Daily Lovell Sheehan, MD   1,000 mcg at 06/09/18 8115     Discharge Medications: Please see discharge summary for a list of discharge medications.  Relevant Imaging Results:  Relevant Lab Results:   Additional Information SSN [id number]  Ross Ludwig, [place]

## 2018-06-09 NOTE — Evaluation (Signed)
Physical Therapy Evaluation Patient Details Name: Roger Rodriguez MRN: [id number] DOB: 12/16/1940 Today's Date: 06/09/2018   History of Present Illness  Patient is a 77 y/o male that presents s/p R total knee revision. He had previously had hardwear removal with anti-biotic spacer placed on 9/5. He has a history of Parkinson's, and prostate cancer.   Clinical Impression  Patient is a pleasant 77 y/o male that presents with R total knee revision after anti-biotic spacer placement removal. He has a history of Parkinson's, and prior to this admission was living at home with round the clock care. He had been using a SPC, however has a history of several falls recently. He demonstrates significant rigidity with bed exercises and mobility, however is able to sit nearly independently at the edge of the bed. Multiple attempts at sit to stand were performed, and he was able to complete with max A +2 from elevated position. Given his rigidity he was unable to take any steps, but was able to stand for 1-2 minutes with RW and therapist support. Given his immense physical assistance need currently he would benefit from SNF placement at discharge to improve his functional mobility.     Follow Up Recommendations SNF    Equipment Recommendations       Recommendations for Other Services       Precautions / Restrictions Precautions Precautions: Fall Restrictions Weight Bearing Restrictions: Yes RLE Weight Bearing: Weight bearing as tolerated      Mobility  Bed Mobility Overal bed mobility: Needs Assistance Bed Mobility: Supine to Sit;Sit to Supine     Supine to sit: Mod assist Sit to supine: Max assist;+2 for physical assistance   General bed mobility comments: Patient requires significant assistance for his torso and upper extremities to complete transfers appropriately.   Transfers Overall transfer level: Needs assistance Equipment used: Rolling walker (2 wheeled) Transfers: Sit to/from  Stand Sit to Stand: Max assist;+2 physical assistance         General transfer comment: From elevated bed position, patient had difficulty bringing his hips underneath torso and had difficulty completing transfer due to "freezing" episodes.   Ambulation/Gait                Stairs            Wheelchair Mobility    Modified Rankin (Stroke Patients Only)       Balance Overall balance assessment: Needs assistance;Mild deficits observed, not formally tested;History of Falls Sitting-balance support: Bilateral upper extremity supported Sitting balance-Leahy Scale: Good Sitting balance - Comments: Patient has slight posterior lean intermittently.    Standing balance support: Bilateral upper extremity supported Standing balance-Leahy Scale: Poor Standing balance comment: Patient is unable to bring his hips anteriorly or perform adequate standing balance without max A                             Pertinent Vitals/Pain Pain Assessment: Faces Faces Pain Scale: Hurts little more Pain Location: L knee  Pain Descriptors / Indicators: Aching Pain Intervention(s): Limited activity within patient's tolerance;Repositioned;Monitored during session    Home Living Family/patient expects to be discharged to:: Private residence Living Arrangements: Other (Comment)(Full time caregivers) Available Help at Discharge: Family;Friend(s);Available PRN/intermittently Type of Home: House Home Access: Ramped entrance     Home Layout: One level Home Equipment: Walker - 2 wheels;Cane - single point Additional Comments: has lift bed (no rails), RW, transport chair    Prior Function Level  of Independence: Needs assistance   Gait / Transfers Assistance Needed: Pt has been able to do some very limited walking recently, does get out (usually MD appointments) at least weekly in w/c  ADL's / Homemaking Assistance Needed: Has 24/7 aides, assist with most tasks  Comments: Patient  endorses several recent falls. Has been able to perform limited ambulation with SPC.      Hand Dominance   Dominant Hand: Right    Extremity/Trunk Assessment   Upper Extremity Assessment Upper Extremity Assessment: Overall WFL for tasks assessed    Lower Extremity Assessment Lower Extremity Assessment: RLE deficits/detail RLE Deficits / Details: Difficulty with completing SLRs independently, able to perform LAQs independently.        Communication   Communication: No difficulties  Cognition Arousal/Alertness: Awake/alert Behavior During Therapy: WFL for tasks assessed/performed Overall Cognitive Status: Within Functional Limits for tasks assessed                                 General Comments: Patient is able to ask for assistance appropriately with appropriate recall and insight.       General Comments      Exercises Total Joint Exercises Ankle Circles/Pumps: AROM;15 reps;Both Heel Slides: AROM;AAROM;Both;20 reps Hip ABduction/ADduction: AROM;AAROM;Both;20 reps Straight Leg Raises: AROM;AAROM;Both;20 reps Long Arc Quad: AROM;AAROM;15 reps;Both Goniometric ROM: 14-68 Marching in Standing: AROM;Both;10 reps(In sitting) Other Exercises Other Exercises: Performed multiple sit to stand transfers with max A x1-2 from elevated bed position.    Assessment/Plan    PT Assessment Patient needs continued PT services  PT Problem List Decreased strength;Decreased range of motion;Decreased activity tolerance;Decreased balance;Pain;Decreased knowledge of use of DME;Decreased knowledge of precautions;Decreased safety awareness;Decreased mobility       PT Treatment Interventions DME instruction;Therapeutic activities;Gait training;Therapeutic exercise;Balance training    PT Goals (Current goals can be found in the Care Plan section)  Acute Rehab PT Goals Patient Stated Goal: To improve his mobility to return home  PT Goal Formulation: With patient/family Time  For Goal Achievement: 06/23/18 Potential to Achieve Goals: Good    Frequency BID   Barriers to discharge Decreased caregiver support      Co-evaluation               AM-PAC PT "6 Clicks" Mobility  Outcome Measure Help needed turning from your back to your side while in a flat bed without using bedrails?: A Lot Help needed moving from lying on your back to sitting on the side of a flat bed without using bedrails?: A Lot Help needed moving to and from a bed to a chair (including a wheelchair)?: Total Help needed standing up from a chair using your arms (e.g., wheelchair or bedside chair)?: Total Help needed to walk in hospital room?: Total Help needed climbing 3-5 steps with a railing? : Total 6 Click Score: 8    End of Session Equipment Utilized During Treatment: Gait belt Activity Tolerance: Patient tolerated treatment well Patient left: in bed;with call bell/phone within reach;with bed alarm set;with family/visitor present Nurse Communication: Mobility status PT Visit Diagnosis: Repeated falls (R29.6);Difficulty in walking, not elsewhere classified (R26.2);Muscle weakness (generalized) (M62.81)    Time: 5374-8270 PT Time Calculation (min) (ACUTE ONLY): 34 min   Charges:   PT Evaluation $PT Eval Moderate Complexity: 1 Mod PT Treatments $Therapeutic Exercise: 8-22 mins        Royce Macadamia PT, DPT, CSCS    06/09/2018, 12:48 PM

## 2018-06-10 LAB — CBC
HCT: 30.3 % — ABNORMAL LOW (ref 39.0–52.0)
Hemoglobin: 9.3 g/dL — ABNORMAL LOW (ref 13.0–17.0)
MCH: 29.2 pg (ref 26.0–34.0)
MCHC: 30.7 g/dL (ref 30.0–36.0)
MCV: 95.3 fL (ref 80.0–100.0)
Platelets: 256 10*3/uL (ref 150–400)
RBC: 3.18 MIL/uL — ABNORMAL LOW (ref 4.22–5.81)
RDW: 14.8 % (ref 11.5–15.5)
WBC: 7.4 10*3/uL (ref 4.0–10.5)
nRBC: 0 % (ref 0.0–0.2)

## 2018-06-10 LAB — GLUCOSE, CAPILLARY
Glucose-Capillary: 137 mg/dL — ABNORMAL HIGH (ref 70–99)
Glucose-Capillary: 185 mg/dL — ABNORMAL HIGH (ref 70–99)
Glucose-Capillary: 145 mg/dL — ABNORMAL HIGH (ref 70–99)

## 2018-06-10 MED ORDER — INSULIN ASPART 100 UNIT/ML ~~LOC~~ SOLN: 0.0000 [IU] | Freq: Three times a day (TID) | SUBCUTANEOUS | 11 refills | Status: DC

## 2018-06-10 MED ORDER — OXYCODONE HCL 5 MG PO TABS: 5.0000 mg | ORAL_TABLET | Freq: Four times a day (QID) | ORAL | 0 refills | Status: DC | PRN

## 2018-06-10 MED ORDER — ASPIRIN EC 325 MG PO TBEC: 325.0000 mg | DELAYED_RELEASE_TABLET | Freq: Every day | ORAL | 0 refills | Status: DC

## 2018-06-10 NOTE — Progress Notes (Signed)
Patient is medically stable for D/C to [facility] today. [facility] authorization has been received, authorization # [id number]. Per Otila Kluver [facility] liaison patient can come today to room 602. RN will call report and arrange EMS for transport. Clinical Education officer, museum (CSW) sent D/C orders to [facility] via [facility]. Patient is aware of above. CSW contacted patient's son Erline Levine and made him aware of above. Please reconsult if future social work needs arise. CSW signing off.   [employer], LCSW [phone]

## 2018-06-10 NOTE — Progress Notes (Signed)
Subjective: 2 Days Post-Op Procedure(s) (LRB): TOTAL KNEE REVISION (Right)    Patient reports pain as moderate.  Objective:   VITALS:   Vitals:   06/10/18 0541 06/10/18 0746  BP: 126/69 120/75  Pulse: 81 81  Resp: 18   Temp: 97.7 F (36.5 C) 97.8 F (36.6 C)  SpO2: 99% 99%    Neurologically intact ABD soft Neurovascular intact Sensation intact distally Intact pulses distally Dorsiflexion/Plantar flexion intact Incision: dressing C/D/I  LABS Recent Labs    06/09/18 0458 06/10/18 0434  HGB 9.7* 9.3*  HCT 31.4* 30.3*  WBC 10.4 7.4  PLT 308 256    Recent Labs    06/09/18 0458  NA 133*  K 4.8  BUN 25*  CREATININE 1.21  GLUCOSE 261*    No results for input(s): LABPT, INR in the last 72 hours.   Assessment/Plan: 2 Days Post-Op Procedure(s) (LRB): TOTAL KNEE REVISION (Right)   Advance diet Up with therapy Discharge to SNF   EC  ASA  325mg   Bid for 6 weeks  RTC  7-10 days to see Dr Harlow Mares

## 2018-06-10 NOTE — Clinical Social Work Placement (Signed)
   CLINICAL SOCIAL WORK PLACEMENT  NOTE  Date:  06/10/2018  Patient Details  Name: Roger Rodriguez MRN: [id number] Date of Birth: 06-Sep-1940  Clinical Social Work is seeking post-discharge placement for this patient at the [place] level of care (*CSW will initial, date and re-position this form in  chart as items are completed):  Yes   Patient/family provided with [facility] Work Department's list of facilities offering this level of care within the geographic area requested by the patient (or if unable, by the patient's family).  Yes   Patient/family informed of their freedom to choose among providers that offer the needed level of care, that participate in Medicare, Medicaid or managed care program needed by the patient, have an available bed and are willing to accept the patient.  Yes   Patient/family informed of [facility]'s ownership interest in [facility] and [facility], as well as of the fact that they are under no obligation to receive care at these facilities.  PASRR submitted to EDS on       PASRR number received on       Existing PASRR number confirmed on 06/09/18     FL2 transmitted to all facilities in geographic area requested by pt/family on 06/09/18     FL2 transmitted to all facilities within larger geographic area on       Patient informed that his/her managed care company has contracts with or will negotiate with certain facilities, including the following:        Yes   Patient/family informed of bed offers received.  Patient chooses bed at ([facility] )     Physician recommends and patient chooses bed at      Patient to be transferred to ([facility] ) on 06/10/18.  Patient to be transferred to facility by [facility] )     Patient family notified on 06/10/18 of transfer.  Name of family member notified:  (Patient's son Erline Levine is aware of D/C today. )     PHYSICIAN       Additional Comment:     _______________________________________________ Tamia Dial, Veronia Beets, LCSW 06/10/2018, 1:46 PM

## 2018-06-10 NOTE — Discharge Summary (Signed)
Physician Discharge Summary  Patient ID: Roger Rodriguez MRN: [id number] DOB/AGE: [birth year] 77 y.o.  Admit date: 06/08/2018 Discharge date: 06/10/2018  Admission Diagnoses:  Discharge Diagnoses:  Active Problems:   S/P revision of total knee, right   Discharged Condition: good  Hospital Course: Patient underwent revision implantation of a right total knee replacement on Wednesday, 06/08/2018.  This was done by Dr. Harlow Mares.  Was operatively he is done well.  He is afebrile and his white count is normal.  He is off antibiotics.  He has started physical therapy but is making very slow progress.  Due to this and his advanced Parkinson's disease skilled rehab is necessary.  He [facility].  Consults: None  Significant Diagnostic Studies: Satisfactory postop right knee x-rays  Treatments: antibiotics: Ancef  Discharge Exam: Blood pressure 120/75, pulse 81, temperature 97.8 F (36.6 C), temperature source Axillary, resp. rate 18, height 5\' 11"  (1.803 m), weight 95.3 kg, SpO2 99 %. Right leg normal in appearance.  Neurovascular status good.  Dressing is clean dry and intact.  Disposition: Discharge disposition: 03-Skilled Nursing Facility       Discharge Instructions    Call MD for:  persistant nausea and vomiting   Complete by:  As directed    Call MD for:  redness, tenderness, or signs of infection (pain, swelling, redness, odor or green/yellow discharge around incision site)   Complete by:  As directed    Call MD for:  severe uncontrolled pain   Complete by:  As directed    Call MD for:  temperature >100.4   Complete by:  As directed    Diet general   Complete by:  As directed    Increase activity slowly   Complete by:  As directed    Leave dressing on - Keep it clean, dry, and intact until clinic visit   Complete by:  As directed    Partial weight bearing   Complete by:  As directed    50%     Allergies as of  06/10/2018      Reactions   Sulfa Antibiotics Swelling   Lip swelling      Medication List    STOP taking these medications   HYDROcodone-acetaminophen 5-325 MG tablet Commonly known as:  NORCO/VICODIN     TAKE these medications   ARTIFICIAL TEARS OP Place 1 drop into both eyes 4 (four) times daily as needed (for dry eyes).   aspirin EC 325 MG tablet Take 1 tablet (325 mg total) by mouth daily.   budesonide 32 MCG/ACT nasal spray Commonly known as:  RHINOCORT AQUA USE 1 PUFF IN EACH NOSTRIL EVERY DAY AS NEEDED What changed:  See the new instructions.   cholecalciferol 1000 units tablet Commonly known as:  VITAMIN D Take 1,000 Units by mouth daily.   docusate sodium 100 MG capsule Commonly known as:  COLACE Take 1 capsule (100 mg total) by mouth 2 (two) times daily. What changed:    when to take this  reasons to take this   gabapentin 400 MG capsule Commonly known as:  NEURONTIN Take 1,200 mg by mouth 3 (three) times daily.   insulin aspart 100 UNIT/ML injection Commonly known as:  novoLOG Inject 0-15 Units into the skin 3 (three) times daily with meals.   lisinopril 2.5 MG tablet Commonly known as:  PRINIVIL,ZESTRIL Take 2.5 mg by mouth 2 (two) times daily.   loratadine 10 MG tablet Commonly known as:  CLARITIN Take 10 mg by mouth daily.   mirabegron ER 25 MG Tb24 tablet Commonly known as:  MYRBETRIQ Myrbetriq 25 mg tablet,extended release  TAKE 1 TABLET (25 MG TOTAL) 2 (TWO) TIMES DAILY BY MOUTH. What changed:    how much to take  how to take this  when to take this  additional instructions   multivitamin with minerals Tabs tablet Take 1 tablet by mouth daily.   oxyCODONE 5 MG immediate release tablet Commonly known as:  Oxy IR/ROXICODONE Take 1 tablet (5 mg total) by mouth every 6 (six) hours as needed for severe pain.   polyethylene glycol packet Commonly known as:  MIRALAX / GLYCOLAX Take 17 g by mouth daily.   pramipexole 0.5 MG  tablet Commonly known as:  MIRAPEX Take 2 tablets (1 mg total) by mouth at bedtime. What changed:    how much to take  when to take this  additional instructions   RYTARY 23.75-95 MG Cpcr Generic drug:  Carbidopa-Levodopa ER Take 3 capsules by mouth 4 (four) times daily.   vitamin B-12 1000 MCG tablet Commonly known as:  CYANOCOBALAMIN Take 1,000 mcg by mouth daily.   vitamin C 500 MG tablet Commonly known as:  ASCORBIC ACID Take 500 mg by mouth daily.   vitamin E 400 UNIT capsule Generic drug:  vitamin E Take 400 Units by mouth daily.            Durable Medical Equipment  (From admission, onward)         Start     Ordered   06/10/18 1241  DME 3-in-1  Once     06/10/18 1243           Discharge Care Instructions  (From admission, onward)         Start     Ordered   06/10/18 0000  Partial weight bearing     06/10/18 1243         Contact information for after-discharge care    Destination    [facility]  SNF Preferred SNF .   Service:  Skilled Nursing Contact information: [address] [place] [phone]              Signed: Park Breed 06/10/2018, 12:45 PM

## 2018-06-10 NOTE — Progress Notes (Signed)
Physical Therapy Treatment Patient Details Name: Roger Rodriguez MRN: [id number] DOB: 17-Jul-1940 Today's Date: 06/10/2018    History of Present Illness Patient is a 77 y/o male that presents s/p R total knee revision. He had previously had hardwear removal with anti-biotic spacer placed on 9/5. He has a history of Parkinson's, and prostate cancer.     PT Comments    Patient alert and agreeable to PT at start of session, has mild complaints of R knee pain. Patient able to participate in therapeutic exercises with varying levels of physical assist needed for RLE (AAROM SLR, hip abduction/adduction/SAQ) with improvement noted in quality of movements with max verbal encouragement/tactile cues. Bed mobility performed with maxAx2, able to sit EOB for several minutes with CGA/close supervision and verbal cues to maintain balance. Sit <> stand with RW and maxAx2, pt had difficulty with bilateral hip and knee extension to achieve full standing position. Pt repositioned in bed, fatigued at end of session. The patient would benefit from further skilled PT to continue to maximize mobility, strength, and independence.    Follow Up Recommendations  SNF     Equipment Recommendations       Recommendations for Other Services       Precautions / Restrictions Precautions Precautions: Fall Restrictions Weight Bearing Restrictions: Yes RLE Weight Bearing: Weight bearing as tolerated    Mobility  Bed Mobility Overal bed mobility: Needs Assistance Bed Mobility: Supine to Sit;Sit to Supine     Supine to sit: Mod assist;+2 for physical assistance Sit to supine: Max assist;+2 for physical assistance      Transfers Overall transfer level: Needs assistance Equipment used: Rolling walker (2 wheeled) Transfers: Sit to/from Stand Sit to Stand: Max assist;+2 physical assistance;From elevated surface         General transfer comment: attempted x2, pt requesting to have his shoes on. Difficulty with  hip extension/knee extension bilaterally to achieve full standing postion. maxAx2 to maintain standing position.  Ambulation/Gait                 Stairs             Wheelchair Mobility    Modified Rankin (Stroke Patients Only)       Balance Overall balance assessment: Needs assistance;Mild deficits observed, not formally tested;History of Falls Sitting-balance support: Bilateral upper extremity supported Sitting balance-Leahy Scale: Good Sitting balance - Comments: Verbal cues to maintain sitting balance with CGA/close supervision   Standing balance support: Bilateral upper extremity supported Standing balance-Leahy Scale: Zero                              Cognition Arousal/Alertness: Awake/alert Behavior During Therapy: WFL for tasks assessed/performed Overall Cognitive Status: Within Functional Limits for tasks assessed                                 General Comments: Pt soft spoken and flat affect, but able to communicate needs/concerns.      Exercises Total Joint Exercises Ankle Circles/Pumps: AROM;Both;20 reps Quad Sets: AROM;Strengthening;Both;20 reps Gluteal Sets: AROM;Strengthening;Both;20 reps Short Arc Quad: AAROM;Strengthening;Right;15 reps Heel Slides: AAROM;Left;10 reps Hip ABduction/ADduction: AAROM;Both;15 reps Straight Leg Raises: AAROM;Right;10 reps;AROM;Left Other Exercises Other Exercises: Pt able to sit EOB for at least 5 minutes without physical assist, feet supported.    General Comments        Pertinent Vitals/Pain Pain Assessment: Faces  Faces Pain Scale: Hurts little more Pain Location: L knee  Pain Descriptors / Indicators: Aching Pain Intervention(s): Limited activity within patient's tolerance;Monitored during session;Ice applied;Repositioned;Premedicated before session    Home Living                      Prior Function            PT Goals (current goals can now be found in the  care plan section) Progress towards PT goals: Progressing toward goals    Frequency    BID      PT Plan Current plan remains appropriate    Co-evaluation              AM-PAC PT "6 Clicks" Mobility   Outcome Measure  Help needed turning from your back to your side while in a flat bed without using bedrails?: A Lot Help needed moving from lying on your back to sitting on the side of a flat bed without using bedrails?: A Lot Help needed moving to and from a bed to a chair (including a wheelchair)?: Total Help needed standing up from a chair using your arms (e.g., wheelchair or bedside chair)?: Total Help needed to walk in hospital room?: Total Help needed climbing 3-5 steps with a railing? : Total 6 Click Score: 8    End of Session Equipment Utilized During Treatment: Gait belt Activity Tolerance: Patient limited by fatigue Patient left: in bed;with call bell/phone within reach;with bed alarm set;with family/visitor present Nurse Communication: Mobility status PT Visit Diagnosis: Repeated falls (R29.6);Difficulty in walking, not elsewhere classified (R26.2);Muscle weakness (generalized) (M62.81)     Time: 1020-1109 PT Time Calculation (min) (ACUTE ONLY): 49 min  Charges:  $Therapeutic Exercise: 8-22 mins $Therapeutic Activity: 23-37 mins                     Lieutenant Diego PT, DPT [phone] PM,06/10/18 [phone]

## 2018-06-10 NOTE — Progress Notes (Signed)
Clinical Social Worker (CSW) met with patient alone at bedside and presented bed offers. He chose [facility]. CSW started [facility] SNF authorization. Patient understands that [facility] will have to approve SNF. [facility] liaison is aware of above. CSW will continue to follow and assist as needed.    , LCSW [phone]  

## 2018-06-10 NOTE — Plan of Care (Signed)
  Problem: Activity: Goal: Risk for activity intolerance will decrease Outcome: Progressing   Problem: Coping: Goal: Level of anxiety will decrease Outcome: Progressing   Problem: Elimination: Goal: Will not experience complications related to bowel motility Outcome: Progressing   Problem: Pain Managment: Goal: General experience of comfort will improve Outcome: Progressing   Problem: Skin Integrity: Goal: Risk for impaired skin integrity will decrease Outcome: Progressing

## 2018-06-10 NOTE — Progress Notes (Signed)
Patient is being discharged to [facility] room 602. Report called to Pincus Large RN. AVS printed, patient prepared for transport via EMS, dressing changed. IV removed. EMS called.

## 2018-06-10 NOTE — Care Management Important Message (Signed)
Copy of signed IM left with patient in room.  

## 2018-06-10 NOTE — Clinical Social Work Placement (Signed)
   CLINICAL SOCIAL WORK PLACEMENT  NOTE  Date:  06/10/2018  Patient Details  Name: MAHARI [name] MRN: [id number] Date of Birth: 07-19-1940  Clinical Social Work is seeking post-discharge placement for this patient at the [place] level of care (*CSW will initial, date and re-position this form in  chart as items are completed):  Yes   Patient/family provided with [facility] Work Department's list of facilities offering this level of care within the geographic area requested by the patient (or if unable, by the patient's family).  Yes   Patient/family informed of their freedom to choose among providers that offer the needed level of care, that participate in Medicare, Medicaid or managed care program needed by the patient, have an available bed and are willing to accept the patient.  Yes   Patient/family informed of [facility]'s ownership interest in [facility] and [facility], as well as of the fact that they are under no obligation to receive care at these facilities.  PASRR submitted to EDS on       PASRR number received on       Existing PASRR number confirmed on 06/09/18     FL2 transmitted to all facilities in geographic area requested by pt/family on 06/09/18     FL2 transmitted to all facilities within larger geographic area on       Patient informed that his/her managed care company has contracts with or will negotiate with certain facilities, including the following:        Yes   Patient/family informed of bed offers received.  Patient chooses bed at ([facility] )     Physician recommends and patient chooses bed at      Patient to be transferred to   on  .  Patient to be transferred to facility by       Patient family notified on   of transfer.  Name of family member notified:        PHYSICIAN       Additional Comment:    _______________________________________________ Shantanu Strauch, Veronia Beets, LCSW 06/10/2018, 11:25 AM

## 2018-06-11 ENCOUNTER — Encounter: Payer: Self-pay | Admitting: Orthopedic Surgery

## 2018-06-12 DIAGNOSIS — G20 Parkinson's disease: Secondary | ICD-10-CM | POA: Diagnosis not present

## 2018-06-12 DIAGNOSIS — G2581 Restless legs syndrome: Secondary | ICD-10-CM | POA: Diagnosis not present

## 2018-06-12 DIAGNOSIS — Z8546 Personal history of malignant neoplasm of prostate: Secondary | ICD-10-CM | POA: Diagnosis not present

## 2018-06-12 DIAGNOSIS — D649 Anemia, unspecified: Secondary | ICD-10-CM | POA: Diagnosis not present

## 2018-06-12 DIAGNOSIS — R2681 Unsteadiness on feet: Secondary | ICD-10-CM | POA: Diagnosis not present

## 2018-06-12 DIAGNOSIS — J302 Other seasonal allergic rhinitis: Secondary | ICD-10-CM | POA: Diagnosis not present

## 2018-06-12 DIAGNOSIS — R739 Hyperglycemia, unspecified: Secondary | ICD-10-CM | POA: Diagnosis not present

## 2018-06-12 DIAGNOSIS — T84092D Other mechanical complication of internal right knee prosthesis, subsequent encounter: Secondary | ICD-10-CM | POA: Diagnosis not present

## 2018-06-12 DIAGNOSIS — R52 Pain, unspecified: Secondary | ICD-10-CM | POA: Diagnosis not present

## 2018-06-12 DIAGNOSIS — M9711XS Periprosthetic fracture around internal prosthetic right knee joint, sequela: Secondary | ICD-10-CM | POA: Diagnosis not present

## 2018-06-12 DIAGNOSIS — M25561 Pain in right knee: Secondary | ICD-10-CM | POA: Diagnosis not present

## 2018-06-12 DIAGNOSIS — R2241 Localized swelling, mass and lump, right lower limb: Secondary | ICD-10-CM | POA: Diagnosis not present

## 2018-06-12 DIAGNOSIS — J309 Allergic rhinitis, unspecified: Secondary | ICD-10-CM | POA: Diagnosis not present

## 2018-06-12 DIAGNOSIS — K5900 Constipation, unspecified: Secondary | ICD-10-CM | POA: Diagnosis not present

## 2018-06-12 DIAGNOSIS — R600 Localized edema: Secondary | ICD-10-CM | POA: Diagnosis not present

## 2018-06-12 DIAGNOSIS — N3941 Urge incontinence: Secondary | ICD-10-CM | POA: Diagnosis not present

## 2018-06-12 DIAGNOSIS — R262 Difficulty in walking, not elsewhere classified: Secondary | ICD-10-CM | POA: Diagnosis not present

## 2018-06-12 DIAGNOSIS — E568 Deficiency of other vitamins: Secondary | ICD-10-CM | POA: Diagnosis not present

## 2018-06-12 DIAGNOSIS — T8453XD Infection and inflammatory reaction due to internal right knee prosthesis, subsequent encounter: Secondary | ICD-10-CM | POA: Diagnosis not present

## 2018-06-12 DIAGNOSIS — E119 Type 2 diabetes mellitus without complications: Secondary | ICD-10-CM | POA: Diagnosis not present

## 2018-06-12 DIAGNOSIS — Z96651 Presence of right artificial knee joint: Secondary | ICD-10-CM | POA: Diagnosis not present

## 2018-06-12 DIAGNOSIS — M6281 Muscle weakness (generalized): Secondary | ICD-10-CM | POA: Diagnosis not present

## 2018-06-12 DIAGNOSIS — E785 Hyperlipidemia, unspecified: Secondary | ICD-10-CM | POA: Diagnosis not present

## 2018-06-12 DIAGNOSIS — I10 Essential (primary) hypertension: Secondary | ICD-10-CM | POA: Diagnosis not present

## 2018-06-13 ENCOUNTER — Other Ambulatory Visit: Payer: Self-pay | Admitting: *Deleted

## 2018-06-13 NOTE — Patient Outreach (Signed)
[name]) Care Management  06/13/2018  Roger Rodriguez Jun 04, 1941 [id number]   Care Coordination   Noted patient admitted for planned Right total knee revision on on 11/27. Patient was discharged to [facility] for rehab on 11/29  Plan Will in basket Post Acute Care Coordinator regarding discharge      [name], RN, [place] Management Coordinator  [phone]- Mobile [phone]- [place]

## 2018-06-14 ENCOUNTER — Other Ambulatory Visit: Payer: Self-pay | Admitting: *Deleted

## 2018-06-14 DIAGNOSIS — J302 Other seasonal allergic rhinitis: Secondary | ICD-10-CM | POA: Diagnosis not present

## 2018-06-14 DIAGNOSIS — K5900 Constipation, unspecified: Secondary | ICD-10-CM | POA: Diagnosis not present

## 2018-06-14 DIAGNOSIS — G2581 Restless legs syndrome: Secondary | ICD-10-CM | POA: Diagnosis not present

## 2018-06-14 DIAGNOSIS — I10 Essential (primary) hypertension: Secondary | ICD-10-CM | POA: Diagnosis not present

## 2018-06-14 DIAGNOSIS — Z96651 Presence of right artificial knee joint: Secondary | ICD-10-CM | POA: Diagnosis not present

## 2018-06-14 DIAGNOSIS — Z8546 Personal history of malignant neoplasm of prostate: Secondary | ICD-10-CM | POA: Diagnosis not present

## 2018-06-14 DIAGNOSIS — G20 Parkinson's disease: Secondary | ICD-10-CM | POA: Diagnosis not present

## 2018-06-14 DIAGNOSIS — M25561 Pain in right knee: Secondary | ICD-10-CM | POA: Diagnosis not present

## 2018-06-14 LAB — AEROBIC/ANAEROBIC CULTURE W GRAM STAIN (SURGICAL/DEEP WOUND)

## 2018-06-14 LAB — AEROBIC/ANAEROBIC CULTURE (SURGICAL/DEEP WOUND): Culture: NO GROWTH

## 2018-06-14 NOTE — Addendum Note (Signed)
Addended by: Wilburt Finlay L on: 06/14/2018 04:19 PM   Modules accepted: Orders

## 2018-06-14 NOTE — Patient Outreach (Signed)
[facility]) Care Management  06/14/2018  Roger Rodriguez April 29, 1941 [id number]   Attended interdisciplinary team meeting at [facility] to discuss patient's progress and plan for transitioning to home. Patient admitted to SNF s/p total knee replacement. PT states patient is at 50% weight bearing status.  SW states patient's usual 24 hour sitters are not available for the next 2-3 weeks.  Advancing to home rep [name] states they will continue to be active with him at discharge.  No discharge date set at this time.  Attempted to see patient while at the facility, he was receiving care at first time went by the room and then he was reported asleep. Unable to see patient in person this visit.  Plan to visit patient at next facility visit.   [name] RN, BSN [place] Acute Care Coordinator [phone]) Business Mobile [phone]) Toll free office

## 2018-06-16 ENCOUNTER — Other Ambulatory Visit: Payer: Self-pay | Admitting: Orthopedic Surgery

## 2018-06-16 ENCOUNTER — Ambulatory Visit
Admission: RE | Admit: 2018-06-16 | Discharge: 2018-06-16 | Disposition: A | Discharge status: Home / Self Care | Payer: PPO | Source: Ambulatory Visit | Attending: Orthopedic Surgery | Admitting: Orthopedic Surgery

## 2018-06-16 DIAGNOSIS — R600 Localized edema: Secondary | ICD-10-CM | POA: Diagnosis not present

## 2018-06-16 DIAGNOSIS — R2241 Localized swelling, mass and lump, right lower limb: Secondary | ICD-10-CM

## 2018-06-16 DIAGNOSIS — Z96651 Presence of right artificial knee joint: Secondary | ICD-10-CM | POA: Diagnosis not present

## 2018-06-21 DIAGNOSIS — J302 Other seasonal allergic rhinitis: Secondary | ICD-10-CM | POA: Diagnosis not present

## 2018-06-21 DIAGNOSIS — Z96651 Presence of right artificial knee joint: Secondary | ICD-10-CM | POA: Diagnosis not present

## 2018-06-21 DIAGNOSIS — Z8546 Personal history of malignant neoplasm of prostate: Secondary | ICD-10-CM | POA: Diagnosis not present

## 2018-06-21 DIAGNOSIS — D649 Anemia, unspecified: Secondary | ICD-10-CM | POA: Diagnosis not present

## 2018-06-21 DIAGNOSIS — I10 Essential (primary) hypertension: Secondary | ICD-10-CM | POA: Diagnosis not present

## 2018-06-21 DIAGNOSIS — G2581 Restless legs syndrome: Secondary | ICD-10-CM | POA: Diagnosis not present

## 2018-06-21 DIAGNOSIS — G20 Parkinson's disease: Secondary | ICD-10-CM | POA: Diagnosis not present

## 2018-06-21 DIAGNOSIS — M25561 Pain in right knee: Secondary | ICD-10-CM | POA: Diagnosis not present

## 2018-06-21 DIAGNOSIS — K5900 Constipation, unspecified: Secondary | ICD-10-CM | POA: Diagnosis not present

## 2018-06-21 NOTE — Anesthesia Postprocedure Evaluation (Signed)
Anesthesia Post Note  Patient: Roger Rodriguez  Procedure(s) Performed: TOTAL KNEE REVISION (Right Knee)  Patient location during evaluation: PACU Anesthesia Type: General Level of consciousness: awake and alert Pain management: pain level controlled Vital Signs Assessment: post-procedure vital signs reviewed and stable Respiratory status: spontaneous breathing, nonlabored ventilation, respiratory function stable and patient connected to nasal cannula oxygen Cardiovascular status: blood pressure returned to baseline and stable Postop Assessment: no apparent nausea or vomiting Anesthetic complications: no     Last Vitals:  Vitals:   06/10/18 1418 06/10/18 1950  BP: (!) 151/74 (!) 108/57  Pulse: 100 (!) 103  Resp:    Temp: 36.7 C 36.8 C  SpO2: 99% 98%    Last Pain:  Vitals:   06/10/18 1950  TempSrc: Oral  PainSc:                  Molli Barrows

## 2018-06-30 DIAGNOSIS — K5900 Constipation, unspecified: Secondary | ICD-10-CM | POA: Diagnosis not present

## 2018-06-30 DIAGNOSIS — G2581 Restless legs syndrome: Secondary | ICD-10-CM | POA: Diagnosis not present

## 2018-06-30 DIAGNOSIS — Z96651 Presence of right artificial knee joint: Secondary | ICD-10-CM | POA: Diagnosis not present

## 2018-06-30 DIAGNOSIS — Z8546 Personal history of malignant neoplasm of prostate: Secondary | ICD-10-CM | POA: Diagnosis not present

## 2018-06-30 DIAGNOSIS — G20 Parkinson's disease: Secondary | ICD-10-CM | POA: Diagnosis not present

## 2018-06-30 DIAGNOSIS — I10 Essential (primary) hypertension: Secondary | ICD-10-CM | POA: Diagnosis not present

## 2018-06-30 DIAGNOSIS — J302 Other seasonal allergic rhinitis: Secondary | ICD-10-CM | POA: Diagnosis not present

## 2018-06-30 DIAGNOSIS — M25561 Pain in right knee: Secondary | ICD-10-CM | POA: Diagnosis not present

## 2018-06-30 DIAGNOSIS — R739 Hyperglycemia, unspecified: Secondary | ICD-10-CM | POA: Diagnosis not present

## 2018-07-11 DIAGNOSIS — E119 Type 2 diabetes mellitus without complications: Secondary | ICD-10-CM | POA: Diagnosis not present

## 2018-07-11 DIAGNOSIS — J302 Other seasonal allergic rhinitis: Secondary | ICD-10-CM | POA: Diagnosis not present

## 2018-07-11 DIAGNOSIS — G2581 Restless legs syndrome: Secondary | ICD-10-CM | POA: Diagnosis not present

## 2018-07-11 DIAGNOSIS — G20 Parkinson's disease: Secondary | ICD-10-CM | POA: Diagnosis not present

## 2018-07-11 DIAGNOSIS — Z8546 Personal history of malignant neoplasm of prostate: Secondary | ICD-10-CM | POA: Diagnosis not present

## 2018-07-11 DIAGNOSIS — I10 Essential (primary) hypertension: Secondary | ICD-10-CM | POA: Diagnosis not present

## 2018-07-11 DIAGNOSIS — K5900 Constipation, unspecified: Secondary | ICD-10-CM | POA: Diagnosis not present

## 2018-07-11 DIAGNOSIS — Z96651 Presence of right artificial knee joint: Secondary | ICD-10-CM | POA: Diagnosis not present

## 2018-07-11 DIAGNOSIS — M25561 Pain in right knee: Secondary | ICD-10-CM | POA: Diagnosis not present

## 2018-07-19 DIAGNOSIS — Z96651 Presence of right artificial knee joint: Secondary | ICD-10-CM | POA: Diagnosis not present

## 2018-07-20 ENCOUNTER — Other Ambulatory Visit: Payer: Self-pay | Admitting: *Deleted

## 2018-07-20 DIAGNOSIS — Z96651 Presence of right artificial knee joint: Secondary | ICD-10-CM | POA: Diagnosis not present

## 2018-07-20 DIAGNOSIS — E119 Type 2 diabetes mellitus without complications: Secondary | ICD-10-CM | POA: Diagnosis not present

## 2018-07-20 DIAGNOSIS — I10 Essential (primary) hypertension: Secondary | ICD-10-CM | POA: Diagnosis not present

## 2018-07-20 DIAGNOSIS — G20 Parkinson's disease: Secondary | ICD-10-CM | POA: Diagnosis not present

## 2018-07-20 DIAGNOSIS — K5900 Constipation, unspecified: Secondary | ICD-10-CM | POA: Diagnosis not present

## 2018-07-20 DIAGNOSIS — Z8546 Personal history of malignant neoplasm of prostate: Secondary | ICD-10-CM | POA: Diagnosis not present

## 2018-07-20 DIAGNOSIS — M25561 Pain in right knee: Secondary | ICD-10-CM | POA: Diagnosis not present

## 2018-07-20 DIAGNOSIS — J302 Other seasonal allergic rhinitis: Secondary | ICD-10-CM | POA: Diagnosis not present

## 2018-07-20 DIAGNOSIS — G2581 Restless legs syndrome: Secondary | ICD-10-CM | POA: Diagnosis not present

## 2018-07-20 NOTE — Patient Outreach (Signed)
[facility]) Care Management  07/20/2018  Roger Rodriguez 04/01/1941 [id number]   Made [facility] Community care manager aware patient plans to discharge 1/8 back to his home with private duty caregivers and [facility].  Also made her aware [facility] Medical Director requested patient be closely followed for signs and symptoms of depression related to all this patient has been through and the small amount of physical gains he has made.  Spoke with patient at facility site visit. Patient stated he was ready to go home.  Patient participating in therapy at the time of visit.   [name] RN, BSN [place] Acute Care Coordinator [phone]) Business Mobile [phone]) Toll free office

## 2018-07-22 ENCOUNTER — Other Ambulatory Visit: Payer: Self-pay | Admitting: *Deleted

## 2018-07-22 ENCOUNTER — Telehealth: Payer: Self-pay | Admitting: Family Medicine

## 2018-07-22 DIAGNOSIS — Z7982 Long term (current) use of aspirin: Secondary | ICD-10-CM | POA: Diagnosis not present

## 2018-07-22 DIAGNOSIS — Z8546 Personal history of malignant neoplasm of prostate: Secondary | ICD-10-CM | POA: Diagnosis not present

## 2018-07-22 DIAGNOSIS — G20 Parkinson's disease: Secondary | ICD-10-CM | POA: Diagnosis not present

## 2018-07-22 DIAGNOSIS — J309 Allergic rhinitis, unspecified: Secondary | ICD-10-CM | POA: Diagnosis not present

## 2018-07-22 DIAGNOSIS — F329 Major depressive disorder, single episode, unspecified: Secondary | ICD-10-CM | POA: Diagnosis not present

## 2018-07-22 DIAGNOSIS — T8453XD Infection and inflammatory reaction due to internal right knee prosthesis, subsequent encounter: Secondary | ICD-10-CM | POA: Diagnosis not present

## 2018-07-22 DIAGNOSIS — R471 Dysarthria and anarthria: Secondary | ICD-10-CM | POA: Diagnosis not present

## 2018-07-22 DIAGNOSIS — N3281 Overactive bladder: Secondary | ICD-10-CM | POA: Diagnosis not present

## 2018-07-22 DIAGNOSIS — N189 Chronic kidney disease, unspecified: Secondary | ICD-10-CM | POA: Diagnosis not present

## 2018-07-22 DIAGNOSIS — H04123 Dry eye syndrome of bilateral lacrimal glands: Secondary | ICD-10-CM | POA: Diagnosis not present

## 2018-07-22 DIAGNOSIS — Z96651 Presence of right artificial knee joint: Secondary | ICD-10-CM | POA: Diagnosis not present

## 2018-07-22 DIAGNOSIS — G2581 Restless legs syndrome: Secondary | ICD-10-CM | POA: Diagnosis not present

## 2018-07-22 DIAGNOSIS — G4709 Other insomnia: Secondary | ICD-10-CM | POA: Diagnosis not present

## 2018-07-22 DIAGNOSIS — Z79891 Long term (current) use of opiate analgesic: Secondary | ICD-10-CM | POA: Diagnosis not present

## 2018-07-22 DIAGNOSIS — Z87442 Personal history of urinary calculi: Secondary | ICD-10-CM | POA: Diagnosis not present

## 2018-07-22 DIAGNOSIS — M1990 Unspecified osteoarthritis, unspecified site: Secondary | ICD-10-CM | POA: Diagnosis not present

## 2018-07-22 DIAGNOSIS — R32 Unspecified urinary incontinence: Secondary | ICD-10-CM | POA: Diagnosis not present

## 2018-07-22 DIAGNOSIS — Z791 Long term (current) use of non-steroidal anti-inflammatories (NSAID): Secondary | ICD-10-CM | POA: Diagnosis not present

## 2018-07-22 DIAGNOSIS — I129 Hypertensive chronic kidney disease with stage 1 through stage 4 chronic kidney disease, or unspecified chronic kidney disease: Secondary | ICD-10-CM | POA: Diagnosis not present

## 2018-07-22 NOTE — Telephone Encounter (Signed)
[place]isty w/ [place]  Verbal Orders for:  Nursing 1 time a week for 3 weeks  Therapist will go out to evaluate.  Thanks, [employer]

## 2018-07-22 NOTE — Patient Outreach (Signed)
[facility]) Care Management  07/22/2018  Roger Rodriguez [birth year] [id number]   Transition of care initial call   Patient with planned discharge from [facility] on 1/9 , per communication with Janci Minor, [facility] PAC.   Recent Admission to [facility] on 11/27-11/29 , for planned Revision of right total knee.  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   Successful outreach call to patient, HIPAA verified patient discussed doing fairly well compared to all I have been through . Patient able to identify Care Coordinator from prior involvement prior to admission. Patient requested that I return call later today.  1615 Returned call to patient he discussed being discharged from SNF to home on yesterday and just still getting adjusted to being back at home after a month of being away. Patient reports having 24 hours personal care providers and home health visit today. Patient requested return call in the next week.   Plan  Will plan return call in the next 4 business days    [name], RN, [place] Management Coordinator  [phone]- Mobile [phone]- [place]

## 2018-07-22 NOTE — Telephone Encounter (Signed)
ok 

## 2018-07-25 ENCOUNTER — Other Ambulatory Visit: Payer: Self-pay | Admitting: *Deleted

## 2018-07-25 ENCOUNTER — Encounter: Payer: Self-pay | Admitting: *Deleted

## 2018-07-25 NOTE — Telephone Encounter (Signed)
Advised 

## 2018-07-25 NOTE — Patient Outreach (Addendum)
[facility]) Care Management  [place]  07/25/2018   Roger Rodriguez [birth year] [id number]  Subjective:   Transition of care call   Recent Admission to [facility] on 11/27-11/29 , for planned Revision of right total knee.  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  Explained reason for the call and transition of care follow up .  Patient discussed :  Right Knee replacement  Patient reports Home health physical therapy visit on today and has spoken with OT regarding home visit.  Patient discussed pain controlled with prn medication. Patient reports having 24 hour hired  personal care at home, that is available to assist with getting to the bathroom at needed. Patient reports having all medical equipment in place, wheelchair , walker , commode chair , hospital bed.  Patient reports regular bowel movements, improved with having someone to help get him to bathroom when he has the urge. Patient discussed incision at knee is healed still some swelling.  Diabetes Patient discussed checking blood sugar daily and reading today 150. Patient discussed being on sliding scale insulin while in rehab but that was stopped 2 weeks prior to discharge.  Appointments  Patient had recent ortho visit on 1/7 prior to discharge from SNF, next visit 2/18  Patient has not scheduled post discharge visit with PCP but plans to schedule visit.  Patient discussed  Having an appointment with dermatology on this week, he discussed that was enough for this week.   Encounter Medications:  Outpatient Encounter Medications as of 07/25/2018  Medication Sig Note  . aspirin EC 325 MG tablet Take 1 tablet (325 mg total) by mouth daily.   . budesonide (RHINOCORT AQUA) 32  MCG/ACT nasal spray USE 1 PUFF IN EACH NOSTRIL EVERY DAY AS NEEDED (Patient taking differently: Place 1 spray into both nostrils daily as needed for allergies. )   . cholecalciferol (VITAMIN D) 1000 units tablet Take 1,000 Units by mouth daily.    [name] docusate sodium (COLACE) 100 MG capsule Take 1 capsule (100 mg total) by mouth 2 (two) times daily. (Patient taking differently: Take 100 mg by mouth daily as needed for mild constipation. )   . gabapentin (NEURONTIN) 400 MG capsule Take 1,200 mg by mouth 3 (three) times daily.    . Hypromellose (ARTIFICIAL TEARS OP) Place 1 drop into both eyes 4 (four) times daily as needed (for dry eyes).    [name] lisinopril (PRINIVIL,ZESTRIL) 2.5 MG tablet Take 2.5 mg by mouth 2 (two) times daily.    [name] loratadine (CLARITIN) 10 MG tablet Take 10 mg by mouth daily.   . mirabegron ER (MYRBETRIQ) 25 MG TB24 tablet Myrbetriq 25 mg tablet,extended release  TAKE 1 TABLET (25 MG TOTAL) 2 (TWO) TIMES DAILY BY MOUTH. (Patient taking differently: Take 25 mg by mouth daily. )   . Multiple Vitamin (MULTIVITAMIN WITH MINERALS) TABS tablet Take 1 tablet by mouth daily.   [name] oxyCODONE (ROXICODONE) 5 MG immediate release tablet Take 1 tablet (5 mg total) by mouth every 6 (six) hours as needed for severe pain.   . polyethylene glycol (MIRALAX / GLYCOLAX) packet Take 17 g by mouth daily.   . pramipexole (MIRAPEX) 0.5 MG tablet Take 2 tablets (1 mg total) by mouth at bedtime. (Patient taking differently: Take 0.25-0.5  mg by mouth See admin instructions. Take 0.25 mg by mouth twice daily. Take 0.5 mg by mouth at bedtime.) 05/26/2018: Taking 1/2 tablet twice daily and whole tablet at bedtime   . RYTARY 23.75-95 MG CPCR Take 3 capsules by mouth 4 (four) times daily.    . vitamin B-12 (CYANOCOBALAMIN) 1000 MCG tablet Take 1,000 mcg by mouth daily.   . vitamin C (ASCORBIC ACID) 500 MG tablet Take 500 mg by mouth daily.   . vitamin E (VITAMIN E) 400 UNIT capsule Take 400 Units by mouth daily.    .  insulin aspart (NOVOLOG) 100 UNIT/ML injection Inject 0-15 Units into the skin 3 (three) times daily with meals. (Patient not taking: Reported on 07/25/2018)    No facility-administered encounter medications on file as of 07/25/2018.     Functional Status:  In your present state of health, do you have any difficulty performing the following activities: 07/25/2018 06/08/2018  Hearing? N -  Vision? N -  Difficulty concentrating or making decisions? N -  Walking or climbing stairs? Y -  Comment recent knee surgery -  Dressing or bathing? Y -  Comment - -  Doing errands, shopping? Tempie Donning  Comment has 24 hour care providers -  Preparing Food and eating ? Y -  Comment has 24 hour care providers  -  Using the Toilet? Y -  Comment assistance available  -  In the past six months, have you accidently leaked urine? N -  Do you have problems with loss of bowel control? N -  Managing your Medications? N -  Managing your Finances? Y -  Comment family assist -  Housekeeping or managing your Housekeeping? Y -  Comment caregivers does  -  Some recent data might be hidden    Fall/Depression Screening: Fall Risk  07/25/2018 05/26/2018 02/02/2018  Falls in the past year? 0 1 Yes  Number falls in past yr: 0 1 2 or more  Injury with Fall? - 1 Yes  Comment - - damaged artifical replacement in knee and had to have sx.  Risk Factor Category  - - -  Risk for fall due to : History of fall(s);Impaired mobility;Impaired balance/gait Impaired balance/gait;History of fall(s);Impaired mobility -  Follow up Falls prevention discussed Falls evaluation completed;Education provided Falls prevention discussed   PHQ 2/9 Scores 07/25/2018 05/26/2018 02/02/2018 11/18/2017 04/15/2017 02/23/2017 10/22/2016  PHQ - 2 Score 1 2 2 2 3 4 6   PHQ- 9 Score - 5 12 11 14 15 17      Plan:  Will send PCP barrier letter and this initial note.  Will plan weekly transition of care follow up , discussed with patient my  Coworker Tomasa Rand  will  contact him for the next 3 weeks, he verbalized understanding . I Will discuss home visit in next month.   [facility] CM Care Plan Problem One     Most Recent Value  Care Plan Problem One  High risk for readmission related to recent hospital admission /rehab discharge after right knee revision.   Role Documenting the Problem One  Care Management [place] for Problem One  Active  [facility] Long Term Goal   Patient will not experince a hospital admission over the next 60 days   [facility] Long Term Goal Start Date  07/25/18  Interventions for Problem One Long Term Goal  Discussed current clinical state, reivew of symptom of infection, fever, increased pain, redness swelling at site   [facility] CM Short  Term Goal #1   Patient will be able to reports attending all medical appointment over the next 30 days   [facility] CM Short Term Goal #1 Start Date  07/25/18  Interventions for Short Term Goal #1  Advised regarding importance of scheduling PCP appointment for follow up after discharge   [facility] CM Short Term Goal #2   Patient will be able to verbalize increased strength for  mobility in right knee over the next 30 days   [facility] CM Short Term Goal #2 Start Date  07/25/18  Interventions for Short Term Goal #2  Encouraged patient , regarding progress reinforced participation in home health therapy .   [facility] CM Short Term Goal #3  Patient will be able to verbalize improved pain control over the next 30 days   [facility] CM Short Term Goal #3 Start Date  07/25/18  Interventions for Short Tern Goal #3  Discussed with patient importance of staying on top of pain, don't wait until pain is at its worst before you take pain medication .       [name], RN, [place] Management Coordinator  [phone]- Mobile [phone]- Toll Free Main Office

## 2018-07-26 ENCOUNTER — Telehealth: Payer: Self-pay | Admitting: Family Medicine

## 2018-07-26 NOTE — Telephone Encounter (Signed)
Pt neeeing a recommendation for a podiatrist. And needing an appt for rehab f/u.  Nothing showing before the end of the Jan.  He just got out of rehab.  Please advise.  Thanks, [employer]

## 2018-07-26 NOTE — Telephone Encounter (Signed)
[name] w/ [facility] [phone]  Needing Verbal Orders for PT 2 times a week for 3 weeks  Please advise.  Thanks, [employer]

## 2018-07-26 NOTE — Telephone Encounter (Signed)
ok 

## 2018-07-26 NOTE — Telephone Encounter (Signed)
Please review. Thanks!  

## 2018-07-26 NOTE — Telephone Encounter (Signed)
Advised  ED 

## 2018-07-27 DIAGNOSIS — L570 Actinic keratosis: Secondary | ICD-10-CM | POA: Diagnosis not present

## 2018-07-27 DIAGNOSIS — D225 Melanocytic nevi of trunk: Secondary | ICD-10-CM | POA: Diagnosis not present

## 2018-07-27 DIAGNOSIS — L578 Other skin changes due to chronic exposure to nonionizing radiation: Secondary | ICD-10-CM | POA: Diagnosis not present

## 2018-07-27 DIAGNOSIS — Z1283 Encounter for screening for malignant neoplasm of skin: Secondary | ICD-10-CM | POA: Diagnosis not present

## 2018-07-27 DIAGNOSIS — L820 Inflamed seborrheic keratosis: Secondary | ICD-10-CM | POA: Diagnosis not present

## 2018-07-27 DIAGNOSIS — D1800 Hemangioma unspecified site: Secondary | ICD-10-CM | POA: Diagnosis not present

## 2018-07-27 DIAGNOSIS — L812 Freckles: Secondary | ICD-10-CM | POA: Diagnosis not present

## 2018-07-27 DIAGNOSIS — D2230 Melanocytic nevi of unspecified part of face: Secondary | ICD-10-CM | POA: Diagnosis not present

## 2018-07-27 DIAGNOSIS — L219 Seborrheic dermatitis, unspecified: Secondary | ICD-10-CM | POA: Diagnosis not present

## 2018-07-27 DIAGNOSIS — Z85828 Personal history of other malignant neoplasm of skin: Secondary | ICD-10-CM | POA: Diagnosis not present

## 2018-07-27 DIAGNOSIS — L821 Other seborrheic keratosis: Secondary | ICD-10-CM | POA: Diagnosis not present

## 2018-07-27 DIAGNOSIS — D229 Melanocytic nevi, unspecified: Secondary | ICD-10-CM | POA: Diagnosis not present

## 2018-07-28 ENCOUNTER — Ambulatory Visit: Payer: PPO | Admitting: Podiatry

## 2018-07-28 ENCOUNTER — Encounter: Payer: Self-pay | Admitting: Podiatry

## 2018-07-28 VITALS — BP 127/71 | HR 78

## 2018-07-28 DIAGNOSIS — M79675 Pain in left toe(s): Secondary | ICD-10-CM

## 2018-07-28 DIAGNOSIS — M79674 Pain in right toe(s): Secondary | ICD-10-CM

## 2018-07-28 DIAGNOSIS — E119 Type 2 diabetes mellitus without complications: Secondary | ICD-10-CM

## 2018-07-28 DIAGNOSIS — B351 Tinea unguium: Secondary | ICD-10-CM | POA: Diagnosis not present

## 2018-07-28 NOTE — Progress Notes (Signed)
This patient presents to the office with chief complaint of long thick nails and diabetic feet.  This patient  says there  is  no pain and discomfort in their feet.  This patient says there are long thick painful nails.  These nails are painful walking and wearing shoes.  Patient has no history of infection or drainage from both feet.  Patient is unable to  self treat his own nails . This patient presents  to the office today for treatment of the  long nails and a foot evaluation due to history of  diabetes.  General Appearance  Alert, conversant and in no acute stress.  Vascular  Dorsalis pedis and posterior tibial  pulses are palpable  bilaterally.  Capillary return is within normal limits  bilaterally. Temperature is within normal limits  Bilaterally. Purplish discoloraton feet  B/l.  Neurologic  Senn-Weinstein monofilament wire test within normal limits  bilaterally. Muscle power within normal limits bilaterally.  Nails Thick disfigured discolored nails with subungual debris  from hallux to fifth toes bilaterally. No evidence of bacterial infection or drainage bilaterally.  Orthopedic  No limitations of motion of motion feet .  No crepitus or effusions noted.  No bony pathology or digital deformities noted.  Skin  normotropic skin with no porokeratosis noted bilaterally.  No signs of infections or ulcers noted.     Onychomycosis  Diabetes with no foot complications  IE  Debride nails x 10.  A diabetic foot exam was performed and there is no evidence of any vascular or neurologic pathology.  Venous pathology noted.    RTC 3 months.   Gardiner Barefoot DPM

## 2018-07-29 ENCOUNTER — Telehealth: Payer: Self-pay | Admitting: Family Medicine

## 2018-07-29 DIAGNOSIS — G20 Parkinson's disease: Secondary | ICD-10-CM

## 2018-07-29 NOTE — Telephone Encounter (Signed)
ok 

## 2018-07-29 NOTE — Telephone Encounter (Signed)
Please review. Thanks!  

## 2018-07-29 NOTE — Telephone Encounter (Signed)
[name] called to let Dr. Rosanna Randy know that Roger Rodriguez has requested OT

## 2018-08-03 ENCOUNTER — Telehealth: Payer: Self-pay | Admitting: Family Medicine

## 2018-08-03 ENCOUNTER — Other Ambulatory Visit: Payer: Self-pay

## 2018-08-03 NOTE — Patient Outreach (Signed)
Transition of care: Covering for Landis Martins:  Placed call to patient who answered and reports that he is about the same. Reports PT came today. States he might be getting a little better. Reports that he is not able to stand yet.    Patient denies any new problems or concerns today.  PLAN: reviewed with patient I would call him back in 1 week and to call me sooner if needed. Reviewed fall precautions.  Tomasa Rand, RN, BSN, CEN [facility] [employer] [phone]

## 2018-08-03 NOTE — Telephone Encounter (Signed)
[name] If this is ordered as [place] as Judson Roch is asking [place] to do, we need a face to face.  The patient has not been seen by [place] since November.  He has since been in the hospital, [place] and had knee revision.  Also I am not sure other than his Parkinson's if there is another diagnosis we are using for this.  Please let me know if we need to see him.  I think he needs it at home but I could be wrong. Thanks

## 2018-08-03 NOTE — Telephone Encounter (Signed)
There was a referral placed in Epic for occupational therapy but it also says patient is established with [facility]. Does this need to be done in patient's home ? If so I need referral to say home health and in the disciplines to indicate occupational therapy

## 2018-08-09 ENCOUNTER — Telehealth: Payer: Self-pay

## 2018-08-09 NOTE — Telephone Encounter (Signed)
[name] is requesting an extension for PT for Roger Rodriguez, Requesting 2 times a week for 2 weeks.

## 2018-08-10 ENCOUNTER — Other Ambulatory Visit: Payer: Self-pay

## 2018-08-10 DIAGNOSIS — G20 Parkinson's disease: Secondary | ICD-10-CM | POA: Diagnosis not present

## 2018-08-10 NOTE — Telephone Encounter (Signed)
Advised Emily as below.  

## 2018-08-10 NOTE — Patient Outreach (Signed)
Transition of care:  Placed call to patient for transition of care. Patient reports he is doing ok. Reports no pain medication for 5 days. Reports that he is getting stronger with PT. Reports he was able to stand with walker for the first time with PT yesterday.   Reports his caregiver assist with meals, bathing and any other needs.  Patient reports that he is managing his medications without difficulty. Reports he plans to make a follow up appointment with primary MD for next week. Reports CBG running 250's.  Reports that he is out of lancet devices and will call MD to get an RX for lancet devices.  PLAN:reviewed safety with patient and encouraged patient to report any changes in condition to MD. Next follow up planned for 1 week.  Tomasa Rand, RN, BSN, CEN [facility] [employer] [phone]

## 2018-08-11 NOTE — Telephone Encounter (Signed)
Follow-up in February if he can get here.  Podiatry referral okay.

## 2018-08-11 NOTE — Telephone Encounter (Signed)
Appointment made for next week.

## 2018-08-15 ENCOUNTER — Ambulatory Visit (INDEPENDENT_AMBULATORY_CARE_PROVIDER_SITE_OTHER): Payer: PPO | Admitting: Family Medicine

## 2018-08-15 VITALS — BP 110/68 | HR 81 | Temp 98.0°F | Resp 14

## 2018-08-15 DIAGNOSIS — T8450XD Infection and inflammatory reaction due to unspecified internal joint prosthesis, subsequent encounter: Secondary | ICD-10-CM | POA: Diagnosis not present

## 2018-08-15 DIAGNOSIS — F321 Major depressive disorder, single episode, moderate: Secondary | ICD-10-CM

## 2018-08-15 DIAGNOSIS — E7800 Pure hypercholesterolemia, unspecified: Secondary | ICD-10-CM | POA: Diagnosis not present

## 2018-08-15 DIAGNOSIS — R7303 Prediabetes: Secondary | ICD-10-CM

## 2018-08-15 DIAGNOSIS — M170 Bilateral primary osteoarthritis of knee: Secondary | ICD-10-CM | POA: Diagnosis not present

## 2018-08-15 DIAGNOSIS — I10 Essential (primary) hypertension: Secondary | ICD-10-CM

## 2018-08-15 DIAGNOSIS — E119 Type 2 diabetes mellitus without complications: Secondary | ICD-10-CM

## 2018-08-15 DIAGNOSIS — G20 Parkinson's disease: Secondary | ICD-10-CM | POA: Diagnosis not present

## 2018-08-15 MED ORDER — GLUCOSE BLOOD VI STRP: ORAL_STRIP | 12 refills | Status: DC

## 2018-08-15 NOTE — Progress Notes (Signed)
Roger Rodriguez  MRN: [id number] DOB: May 31, 1941  Subjective:  HPI   The patient is a 78 year old male who is s/p right total knee replacement on 06/08/18.  From the hospital he went to [facility] and was discharged from [facility] on 07/20/18.   He reports that since his surgery he has had elevated glucose readings.  He does admit that he was on antibiotics and steroids since the surgery.  His readings were running about 165-250 and are now starting to stay more around the 160 range.   He is also in need of new glucometer supplies and a refill on Celebrex.  Celebrex  dose has been cut by three fourths.  Patient Active Problem List   Diagnosis Date Noted  . S/P revision of total knee, 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  . 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 Prosthetic joint infection ([place]) 03/16/2018  . Chronic venous insufficiency 11/22/2017  . ARF (acute renal failure) ([place]) 10/12/2017  . RUQ pain 10/22/2016  . Depression, major, single episode, moderate ([place]) 10/22/2016  . Renal cyst, right 09/28/2016  . Hepatic steatosis 09/28/2016  . Parkinson's disease ([place]) 09/18/2015  . Restless leg 09/18/2015  . Malignant neoplasm of prostate ([place]) 05/22/2015  . Hepatitis A 04/02/2015  . Allergic rhinitis 04/02/2015  . Arthritis 04/02/2015  . Narrowing of intervertebral disc space 04/02/2015  . 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 Accumulation of fluid in tissues 04/02/2015  . Essential (primary) hypertension 04/02/2015  . Borderline diabetes 04/02/2015  . Hyperlipidemia 04/02/2015  . BP (high blood pressure) 04/02/2015  . Infected sebaceous cyst 04/02/2015  . Hernia, inguinal 04/02/2015  . Leg pain 04/02/2015  . Lumbar canal stenosis 04/02/2015  . 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 Malignant melanoma ([place]) 04/02/2015  . Arthritis, degenerative 04/02/2015  . Fatigue 04/02/2015  . Idiopathic Parkinson's disease ([place]) 04/02/2015  . Plantar fasciitis 04/02/2015  . CA  of prostate ([place]) 04/02/2015  . Benign essential tremor 04/02/2015  . Lower urinary tract infection 04/02/2015  . Degeneration of intervertebral disc of lumbosacral region 05/10/2012    Past Medical History:  Diagnosis Date  . Allergy    allergic rhinitis  . Arthritis    OA  . Chronic kidney disease    stones  . Depression   . Family history of adverse reaction to anesthesia   . Hematuria   . History of kidney stones   . Hyperlipidemia   . Hypertension    no meds since parkinsons meds started  . Incontinence of urine   . Inguinal hernia   . Neuromuscular disorder ([place])    parkinsons  . Overactive bladder   . Parkinson's disease ([place])   . Prostate cancer ([place])   . Rhinitis, allergic   . Shortness of breath dyspnea    doe secondary to parkinsons    Social History   Socioeconomic History  . Marital status: Widowed    Spouse name: Not on file  . Number of children: 2  . Years of education: Not on file  . Highest education level: Some college, no degree  Occupational History  . Occupation: retired  Scientific laboratory technician  . Financial resource strain: Not hard at all  . Food insecurity:    Worry: Never true    Inability: Never true  . Transportation needs:    Medical: No    Non-medical: No  Tobacco Use  . Smoking status: Former Smoker    Types: Cigars    Last  attempt to quit: 11/11/1968    Years since quitting: 49.7  . Smokeless tobacco: Former Systems developer    Types: Chew    Quit date: 11/12/2003  . Tobacco comment: Occassionally, also chewed tobacco till 2005. Quit smoking 1973  Substance and Sexual Activity  . Alcohol use: No    Alcohol/week: 0.0 standard drinks  . Drug use: No  . Sexual activity: Not Currently  Lifestyle  . Physical activity:    Days per week: Not on file    Minutes per session: Not on file  . Stress: To some extent  Relationships  . Social connections:    Talks on phone: Not on file    Gets together: Not on file    Attends religious service: Not on  file    Active member of club or organization: Not on file    Attends meetings of clubs or organizations: Not on file    Relationship status: Not on file  . Intimate partner violence:    Fear of current or ex partner: Not on file    Emotionally abused: Not on file    Physically abused: Not on file    Forced sexual activity: Not on file  Other Topics Concern  . Not on file  Social History Narrative  . Not on file    Outpatient Encounter Medications as of 08/15/2018  Medication Sig Note  . aspirin EC 325 MG tablet Take 1 tablet (325 mg total) by mouth daily.   . budesonide (RHINOCORT AQUA) 32 MCG/ACT nasal spray USE 1 PUFF IN EACH NOSTRIL EVERY DAY AS NEEDED (Patient taking differently: Place 1 spray into both nostrils daily as needed for allergies. )   . celecoxib (CELEBREX) 100 MG capsule Take 100 mg by mouth 2 (two) times daily.   . cholecalciferol (VITAMIN D) 1000 units tablet Take 1,000 Units by mouth daily.    [name] docusate sodium (COLACE) 100 MG capsule Take 1 capsule (100 mg total) by mouth 2 (two) times daily. (Patient taking differently: Take 100 mg by mouth daily as needed for mild constipation. )   . gabapentin (NEURONTIN) 400 MG capsule Take 1,200 mg by mouth 3 (three) times daily.    . Hypromellose (ARTIFICIAL TEARS OP) Place 1 drop into both eyes 4 (four) times daily as needed (for dry eyes).    [name] lisinopril (PRINIVIL,ZESTRIL) 2.5 MG tablet Take 2.5 mg by mouth 2 (two) times daily.    [name] loratadine (CLARITIN) 10 MG tablet Take 10 mg by mouth daily.   . mirabegron ER (MYRBETRIQ) 25 MG TB24 tablet Myrbetriq 25 mg tablet,extended release  TAKE 1 TABLET (25 MG TOTAL) 2 (TWO) TIMES DAILY BY MOUTH. (Patient taking differently: Take 25 mg by mouth daily. )   . Multiple Vitamin (MULTIVITAMIN WITH MINERALS) TABS tablet Take 1 tablet by mouth daily.   . polyethylene glycol (MIRALAX / GLYCOLAX) packet Take 17 g by mouth daily.   . pramipexole (MIRAPEX) 0.5 MG tablet Take 2 tablets (1 mg  total) by mouth at bedtime. (Patient taking differently: Take 0.25-0.5 mg by mouth See admin instructions. Take 0.25 mg by mouth twice daily. Take 0.5 mg by mouth at bedtime.) 05/26/2018: Taking 1/2 tablet twice daily and whole tablet at bedtime   . RYTARY 23.75-95 MG CPCR Take 4 capsules by mouth 4 (four) times daily.    . vitamin B-12 (CYANOCOBALAMIN) 1000 MCG tablet Take 1,000 mcg by mouth daily.   . vitamin C (ASCORBIC ACID) 500 MG tablet Take 500 mg by  mouth daily.   . vitamin E (VITAMIN E) 400 UNIT capsule Take 400 Units by mouth daily.    . insulin aspart (NOVOLOG) 100 UNIT/ML injection Inject 0-15 Units into the skin 3 (three) times daily with meals. (Patient not taking: Reported on 08/15/2018)   . oxyCODONE (ROXICODONE) 5 MG immediate release tablet Take 1 tablet (5 mg total) by mouth every 6 (six) hours as needed for severe pain. (Patient not taking: Reported on 08/15/2018)   . [DISCONTINUED] UNABLE TO FIND They can increase Rytary 23.75/95 mg to 4 pills 4 times a day (continue rest medications), reach out to [place] if this doesn't work.   . [DISCONTINUED] UNABLE TO FIND Take by mouth.    No facility-administered encounter medications on file as of 08/15/2018.     Allergies  Allergen Reactions  . Sulfa Antibiotics Swelling    Lip swelling    Review of Systems  Constitutional: Positive for malaise/fatigue. Negative for fever.  Eyes: Negative.   Respiratory: Negative for cough, shortness of breath and wheezing.   Cardiovascular: Positive for leg swelling (up to the knee). Negative for chest pain and palpitations.  Gastrointestinal: Negative.   Musculoskeletal: Positive for joint pain.  Skin: Negative.   Neurological: Positive for weakness.  Endo/Heme/Allergies: Negative.   Psychiatric/Behavioral: Negative.     Objective:  BP 110/68 (BP Location: Right Arm, Patient Position: Sitting, Cuff Size: Normal)   Pulse 81   Temp 98 F (36.7 C) (Oral)   Resp 14   SpO2 99%   Physical Exam    Constitutional: He is oriented to person, place, and time and well-developed, well-nourished, and in no distress.  HENT:  Head: Normocephalic and atraumatic.  Right Ear: External ear normal.  Left Ear: External ear normal.  Nose: Nose normal.  Eyes: No scleral icterus.  Neck: No thyromegaly present.  Cardiovascular: Normal rate, regular rhythm and normal heart sounds.  Pulmonary/Chest: No respiratory distress.  Abdominal: Soft.  Neurological: He is alert and oriented to person, place, and time. Gait normal. GCS score is 15.  Skin: Skin is warm and dry.  Psychiatric: Mood, memory, affect and judgment normal.    Assessment and Plan :  1. Diabetes mellitus without complication (HCC)  - glucose blood (QUINTET AC BLOOD GLUCOSE TEST) test strip; Use as instructed  Dispense: 100 each; Refill: 12 - Comprehensive metabolic panel - Lipid Panel With LDL/HDL Ratio - TSH  2. Essential (primary) hypertension  - CBC with Differential/Platelet - Comprehensive metabolic panel - TSH  3. Idiopathic Parkinson's disease ([place]) Followed by neurology.  Progression of this the disease is aging the patient rapidly.  4. Pure hypercholesterolemia  - Lipid Panel With LDL/HDL Ratio  5. Infection of prosthetic joint, subsequent encounter The infected joint has now been replaced.  He is recovering, now at home. Fall risk continues to be an issue as he recovers. 6. Primary osteoarthritis of both knees   7. Depression, major, single episode, moderate ([place])   8. Borderline diabetes  I have done the exam and reviewed the chart and it is accurate to the best of my knowledge. Development worker, community has been used and  any errors in dictation or transcription are unintentional. Miguel Aschoff M.D. [facility]

## 2018-08-16 ENCOUNTER — Telehealth: Payer: Self-pay | Admitting: Family Medicine

## 2018-08-16 LAB — CBC WITH DIFFERENTIAL/PLATELET
Basophils Absolute: 0 10*3/uL (ref 0.0–0.2)
Basos: 1 %
EOS (ABSOLUTE): 0.1 10*3/uL (ref 0.0–0.4)
Eos: 3 %
Hematocrit: 38.8 % (ref 37.5–51.0)
Hemoglobin: 13.5 g/dL (ref 13.0–17.7)
Immature Grans (Abs): 0 10*3/uL (ref 0.0–0.1)
Immature Granulocytes: 0 %
Lymphocytes Absolute: 1 10*3/uL (ref 0.7–3.1)
Lymphs: 18 %
MCH: 30.5 pg (ref 26.6–33.0)
MCHC: 34.8 g/dL (ref 31.5–35.7)
MCV: 88 fL (ref 79–97)
Monocytes Absolute: 0.6 10*3/uL (ref 0.1–0.9)
Monocytes: 12 %
Neutrophils Absolute: 3.6 10*3/uL (ref 1.4–7.0)
Neutrophils: 66 %
Platelets: 228 10*3/uL (ref 150–450)
RBC: 4.43 x10E6/uL (ref 4.14–5.80)
RDW: 13.9 % (ref 11.6–15.4)
WBC: 5.4 10*3/uL (ref 3.4–10.8)

## 2018-08-16 LAB — TSH: TSH: 2.5 u[IU]/mL (ref 0.450–4.500)

## 2018-08-16 LAB — COMPREHENSIVE METABOLIC PANEL
ALT: 12 IU/L (ref 0–44)
AST: 21 IU/L (ref 0–40)
Albumin/Globulin Ratio: 1.4 (ref 1.2–2.2)
Albumin: 4.2 g/dL (ref 3.7–4.7)
Alkaline Phosphatase: 94 IU/L (ref 39–117)
BUN/Creatinine Ratio: 16 (ref 10–24)
BUN: 17 mg/dL (ref 8–27)
Bilirubin Total: 0.5 mg/dL (ref 0.0–1.2)
CO2: 24 mmol/L (ref 20–29)
Calcium: 9.5 mg/dL (ref 8.6–10.2)
Chloride: 95 mmol/L — ABNORMAL LOW (ref 96–106)
Creatinine, Ser: 1.05 mg/dL (ref 0.76–1.27)
GFR calc Af Amer: 78 mL/min/{1.73_m2} (ref >59)
GFR calc non Af Amer: 68 mL/min/{1.73_m2} (ref >59)
Globulin, Total: 2.9 g/dL (ref 1.5–4.5)
Glucose: 168 mg/dL — ABNORMAL HIGH (ref 65–99)
Potassium: 4.6 mmol/L (ref 3.5–5.2)
Sodium: 135 mmol/L (ref 134–144)
Total Protein: 7.1 g/dL (ref 6.0–8.5)

## 2018-08-16 LAB — LIPID PANEL WITH LDL/HDL RATIO
Cholesterol, Total: 223 mg/dL — ABNORMAL HIGH (ref 100–199)
HDL: 33 mg/dL — ABNORMAL LOW (ref >39)
LDL Calculated: 138 mg/dL — ABNORMAL HIGH (ref 0–99)
LDl/HDL Ratio: 4.2 ratio — ABNORMAL HIGH (ref 0.0–3.6)
Triglycerides: 258 mg/dL — ABNORMAL HIGH (ref 0–149)
VLDL Cholesterol Cal: 52 mg/dL — ABNORMAL HIGH (ref 5–40)

## 2018-08-16 NOTE — Telephone Encounter (Signed)
Pt would like call back regarding labs.  States he wants to make sure blood sugar isn't high.

## 2018-08-16 NOTE — Telephone Encounter (Signed)
Patient advised glucose was 168. He wanted to know if Dr. Rosanna Randy has any further recommendation other than monitoring food intake. Please advise.

## 2018-08-17 ENCOUNTER — Other Ambulatory Visit: Payer: Self-pay

## 2018-08-17 ENCOUNTER — Other Ambulatory Visit: Payer: Self-pay | Admitting: Family Medicine

## 2018-08-17 ENCOUNTER — Ambulatory Visit: Payer: Self-pay | Admitting: Family Medicine

## 2018-08-17 NOTE — Telephone Encounter (Signed)
Pt needing the Rx  celecoxib (CELEBREX) 100 MG capsule  called into: [id number] [address], [place] [phone] (Phone) [phone] (Fax)   Thanks, [employer]

## 2018-08-17 NOTE — Patient Outreach (Signed)
Transition of care:  Placed call to patient who reports that he is doing well. Reports he is able to stand but not walk yet. Reports that he is out of his DM supplies and will pick up hopefully later today. Reports that he saw his primary MD this week and everything is "good".  Patient reports that his parkinson is about the same.   Plan: Will update assigned [facility] care manager.  Will continue weekly transition of care calls weekly.  Tomasa Rand, RN, BSN, CEN [facility] [employer] [phone]

## 2018-08-18 MED ORDER — CELECOXIB 100 MG PO CAPS: 100.0000 mg | ORAL_CAPSULE | Freq: Two times a day (BID) | ORAL | 5 refills | Status: DC

## 2018-08-19 DIAGNOSIS — M6281 Muscle weakness (generalized): Secondary | ICD-10-CM | POA: Diagnosis not present

## 2018-08-19 DIAGNOSIS — R262 Difficulty in walking, not elsewhere classified: Secondary | ICD-10-CM | POA: Diagnosis not present

## 2018-08-19 DIAGNOSIS — G20 Parkinson's disease: Secondary | ICD-10-CM | POA: Diagnosis not present

## 2018-08-19 DIAGNOSIS — T8453XD Infection and inflammatory reaction due to internal right knee prosthesis, subsequent encounter: Secondary | ICD-10-CM | POA: Diagnosis not present

## 2018-08-19 DIAGNOSIS — M9711XS Periprosthetic fracture around internal prosthetic right knee joint, sequela: Secondary | ICD-10-CM | POA: Diagnosis not present

## 2018-08-24 ENCOUNTER — Ambulatory Visit: Payer: Self-pay | Admitting: *Deleted

## 2018-08-25 ENCOUNTER — Encounter: Payer: Self-pay | Admitting: *Deleted

## 2018-08-25 ENCOUNTER — Other Ambulatory Visit: Payer: Self-pay | Admitting: *Deleted

## 2018-08-25 NOTE — Patient Outreach (Signed)
[facility]) Care Management  08/25/2018  Roger Rodriguez 1941-06-25 [id number]   Transition of care call   Recent Admission to [facility] on 11/27-11/29 , for planned Revision of right total knee.  Discharged home from [facility] on 1/9.  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   Patient discussed that he feels like he is making slow progress. He discussed using a slide board for transfer to wheelchair, and car . Patient reports he is able to stand pivot with assistance and use of grab bars to get to the commode, unable to take steps yet.  Patient still has 24 hours care providers. Patient reports fairly good pain control  Patient reports that he has a  physical therapy visit  scheduled for tomorrow for evaluation and  they plan to request additional visits   Patient discussed being able to get supplies to check his blood sugars. Reports reading earlier this week in the 200 range and today 188. He discussed that he was on insulin after surgery and while at [facility] , but not since discharge and PCP has checked blood sugar at last office visit. Encouraged patient regarding continuing to check blood sugars, review of normal ranges.  Patient agreeable to additional education on diabetes.   Plan Patient has completed transition of care program without readmission . Will continue to follow in complex care management program. Patient agreeable to home visit in the next week.   [name], RN, [facility] Management Coordinator  [phone]- Mobile [phone]- Toll Free Main Office

## 2018-08-26 ENCOUNTER — Telehealth: Payer: Self-pay | Admitting: Family Medicine

## 2018-08-26 NOTE — Telephone Encounter (Signed)
Roger Rodriguez, PT w/ [facility]  [phone]  Needing and extension on in home PT: 2 times a week for 2 weeks  Pt got more lancets and is checking is BS levels Since Mon. 08-22-18 Fasting - Before Breakfast [phone]  Thanks, [employer]

## 2018-08-29 NOTE — Telephone Encounter (Signed)
L/M giving orders as below.

## 2018-08-30 DIAGNOSIS — M1712 Unilateral primary osteoarthritis, left knee: Secondary | ICD-10-CM | POA: Diagnosis not present

## 2018-08-31 ENCOUNTER — Telehealth: Payer: Self-pay | Admitting: Family Medicine

## 2018-08-31 DIAGNOSIS — E119 Type 2 diabetes mellitus without complications: Secondary | ICD-10-CM

## 2018-08-31 NOTE — Telephone Encounter (Signed)
Needing to discuss blood glucose.  Has been running high.  Thanks, [employer]

## 2018-08-31 NOTE — Telephone Encounter (Signed)
Start metformin 500 mg p.o. twice daily

## 2018-08-31 NOTE — Telephone Encounter (Signed)
Patient called reporting that his blood sugars have been running high for the past 10 days. Patient states it has been ranging from 186-227. Patient states he currently he is not on any medications for Diabetes. I asked him about the Insulin that was listed in his medication list. He states that medication was given to him when he was in rehab. Patient wants to know if he should be on medication for Diabetes since his blood sugars are elevated? Patient prefers starting an oral tablet verses and injectable medication. Please call patient back and advise. [facility]

## 2018-09-01 ENCOUNTER — Other Ambulatory Visit: Payer: Self-pay | Admitting: *Deleted

## 2018-09-01 ENCOUNTER — Ambulatory Visit: Payer: PPO | Admitting: Family Medicine

## 2018-09-01 MED ORDER — METFORMIN HCL 500 MG PO TABS: 500.0000 mg | ORAL_TABLET | Freq: Two times a day (BID) | ORAL | 3 refills | Status: DC

## 2018-09-01 NOTE — Telephone Encounter (Signed)
Medication was sent into the pharmacy. Patient advised.  

## 2018-09-01 NOTE — Patient Outreach (Signed)
Roger Rodriguez) Care Management   09/01/2018  Roger Rodriguez May 01, 1941 [id number]  Roger Rodriguez is an 78 y.o. male   Transition of care   Recent Admission to [facility] on 11/27-11/29 , for planned Revision of right total knee.  Discharged home from [facility] on 1/9.   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   Subjective:  Reports moving slowly today. Patient discussed recent visit to orthopedist and reports progress to mobility will take months.   Patient discussed concern regarding blood sugar readings in the 180- 200 reports notifying MD office on yesterday and metformin as been prescribed.   Objective: Patient sitting up in wheelchair. BP 128/60 (BP Location: Left Arm, Patient Position: Sitting, Cuff Size: Normal)   Pulse 85   Resp 18   Ht 1.803 m ('5\' 11"' )   Wt 215 lb (97.5 kg)   SpO2 96%   BMI 29.99 kg/m  Review of Systems  Constitutional: Negative.   HENT: Negative.   Eyes: Negative.   Respiratory: Negative.   Cardiovascular: Positive for leg swelling.       Bilateral legs, right leg greater .   Gastrointestinal: Negative.   Genitourinary: Negative.   Musculoskeletal: Positive for joint pain.  Skin: Negative.   Neurological: Negative.   Endo/Heme/Allergies: Negative.     Physical Exam  Constitutional: He is oriented to person, place, and time. He appears well-developed and well-nourished.  Cardiovascular: Normal rate and normal heart sounds.  Respiratory: Effort normal and breath sounds normal.  GI: Soft. Bowel sounds are normal.  Neurological: He is alert and oriented to person, place, and time.  Skin: Skin is warm and dry.  Psychiatric: He has a normal mood and affect. His behavior is normal. Judgment and thought content normal.    Encounter Medications:   Outpatient Encounter Medications as of 09/01/2018   Medication Sig Note  . aspirin EC 325 MG tablet Take 1 tablet (325 mg total) by mouth daily.   . budesonide (RHINOCORT AQUA) 32 MCG/ACT nasal spray USE 1 PUFF IN EACH NOSTRIL EVERY DAY AS NEEDED (Patient taking differently: Place 1 spray into both nostrils daily as needed for allergies. )   . celecoxib (CELEBREX) 100 MG capsule Take 1 capsule (100 mg total) by mouth 2 (two) times daily.   . cholecalciferol (VITAMIN D) 1000 units tablet Take 1,000 Units by mouth daily.    [name] docusate sodium (COLACE) 100 MG capsule Take 1 capsule (100 mg total) by mouth 2 (two) times daily. (Patient taking differently: Take 100 mg by mouth daily as needed for mild constipation. )   . gabapentin (NEURONTIN) 400 MG capsule Take 1,200 mg by mouth 3 (three) times daily.    [name] glucose blood (QUINTET AC BLOOD GLUCOSE TEST) test strip Use as instructed   . Hypromellose (ARTIFICIAL TEARS OP) Place 1 drop into both eyes 4 (four) times daily as needed (for dry eyes).    [name] lisinopril (PRINIVIL,ZESTRIL) 2.5 MG tablet Take 2.5 mg by mouth 2 (two) times daily.    [name] loratadine (CLARITIN) 10 MG tablet Take 10 mg by mouth daily.   . mirabegron ER (MYRBETRIQ) 25 MG TB24 tablet Myrbetriq 25 mg tablet,extended release  TAKE 1 TABLET (25 MG TOTAL) 2 (TWO) TIMES DAILY BY MOUTH. (Patient taking differently: Take 25 mg by mouth daily. )   . Multiple Vitamin (MULTIVITAMIN WITH MINERALS) TABS tablet Take 1  tablet by mouth daily.   . polyethylene glycol (MIRALAX / GLYCOLAX) packet Take 17 g by mouth daily.   . pramipexole (MIRAPEX) 0.5 MG tablet Take 2 tablets (1 mg total) by mouth at bedtime. (Patient taking differently: Take 0.25-0.5 mg by mouth See admin instructions. Take 0.25 mg by mouth twice daily. Take 0.5 mg by mouth at bedtime.) 05/26/2018: Taking 1/2 tablet twice daily and whole tablet at bedtime   . RYTARY 23.75-95 MG CPCR Take 4 capsules by mouth 4 (four) times daily.    . vitamin B-12 (CYANOCOBALAMIN) 1000 MCG tablet Take 1,000 mcg  by mouth daily.   . vitamin C (ASCORBIC ACID) 500 MG tablet Take 500 mg by mouth daily.   . vitamin E (VITAMIN E) 400 UNIT capsule Take 400 Units by mouth daily.    . insulin aspart (NOVOLOG) 100 UNIT/ML injection Inject 0-15 Units into the skin 3 (three) times daily with meals. (Patient not taking: Reported on 08/15/2018)   . oxyCODONE (ROXICODONE) 5 MG immediate release tablet Take 1 tablet (5 mg total) by mouth every 6 (six) hours as needed for severe pain. (Patient not taking: Reported on 08/15/2018)    No facility-administered encounter medications on file as of 09/01/2018.     Functional Status:   In your present state of health, do you have any difficulty performing the following activities: 07/25/2018 06/08/2018  Hearing? N -  Vision? N -  Difficulty concentrating or making decisions? N -  Walking or climbing stairs? Y -  Comment recent knee surgery -  Dressing or bathing? Y -  Comment - -  Doing errands, shopping? Tempie Donning  Comment has 24 hour care providers -  Preparing Food and eating ? Y -  Comment has 24 hour care providers  -  Using the Toilet? Y -  Comment assistance available  -  In the past six months, have you accidently leaked urine? N -  Do you have problems with loss of bowel control? N -  Managing your Medications? N -  Managing your Finances? Y -  Comment family assist -  Housekeeping or managing your Housekeeping? Y -  Comment caregivers does  -  Some recent data might be hidden    Fall/Depression Screening:    Fall Risk  07/25/2018 05/26/2018 02/02/2018  Falls in the past year? 0 1 Yes  Number falls in past yr: 0 1 2 or more  Injury with Fall? - 1 Yes  Comment - - damaged artifical replacement in knee and had to have sx.  Risk Factor Category  - - -  Risk for fall due to : History of fall(s);Impaired mobility;Impaired balance/gait Impaired balance/gait;History of fall(s);Impaired mobility -  Follow up Falls prevention discussed Falls evaluation  completed;Education provided Falls prevention discussed   PHQ 2/9 Scores 09/01/2018 07/25/2018 05/26/2018 02/02/2018 11/18/2017 04/15/2017 02/23/2017  PHQ - 2 Score '2 1 2 2 2 3 4  ' PHQ- 9 Score 5 - '5 12 11 14 15    ' Assessment:  Routine home visit, personal care worker kelly present one of representatives that provides 24 hour care.  Patient reports that he was recently renewed therapy for another 2 weeks.     Right total knee replacement- slow progress with increasing mobility , able to tolerate transfer from bed to chair, chair to commode, using slide board at times. Patient demonstrated some one of exercise taught by therapy to work on .  Diabetes Checking blood sugar once daily, eager to keep diabetes under  control. Monitoring blood sugars daily , this am reading 193, 186,227 and 170 prior days . No low blood sugar readings noted.  Patient has not started new prescription for metformin, plans to pick up from pharmacy today. Will benefit from continued education and support of diabetes. Reviewed low blood sugar reading of 70 , signs and symptoms, and how to treat. Patient often snacks on nuts during the day.  Right heel area Noted dark dime size scab area to right heel, patient and caregiver reports previous blister at site. Site dry, patient using pressure reducing heel protectors and keeps elevated. Emphasized importance of balanced nutrition in wound healing .  Patient has appointment at wound center on 2/21.     Plan:  Will send PCP visit note.  Provided patient EMMI handouts on controlling blood sugar, taking care of self day to day.  Reinforced beginning new prescription for diabetes.  Discussed rescheduling wound center appointment if unable to attend due to weather conditions .  Will plan return call in the next month.    [facility] CM Care Plan Problem One     Most Recent Value  Care Plan Problem One  High risk for readmission related to recent hospital admission /rehab discharge after  right knee revision.   Role Documenting the Problem One  Care Management Coordinator  Care Plan for Problem One  Active  [facility] Long Term Goal   Patient will not experince a hospital admission over the next 60 days   [facility] Long Term Goal Start Date  07/25/18  [facility] CM Short Term Goal #1   Patient will be able to reports attending all medical appointment over the next 30 days   [facility] CM Short Term Goal #1 Start Date  07/25/18  [facility] CM Short Term Goal #1 Met Date  08/25/18  [facility] CM Short Term Goal #2   Patient will be able to verbalize increased strength for  mobility in right knee over the next 30 days   [facility] CM Short Term Goal #2 Start Date  08/25/18  [facility] CM Short Term Goal #3  Patient will be able to verbalize improved pain control over the next 30 days   [facility] CM Short Term Goal #3 Start Date  07/25/18  [facility] CM Short Term Goal #3 Met Date  08/25/18    [facility] CM Care Plan Problem Two     Most Recent Value  Care Plan Problem Two  Knowledge related to diabetes self care managment   Role Documenting the Problem Two  Care Management Coordinator  Care Plan for Problem Two  Active  Interventions for Problem Two Long Term Goal   Discussed current A1c and glucose level using handout in Diabetes packet , reinforced taking medication as prescribed, notifying MD of concern related to continued elevated blood sugar over 200 . Provided handout on controlling diabetes and taking care of self day to day.   [facility] Long Term Goal  Patient will be able to verbalize at 2 strategies of diabetes self care management .   [facility] CM Short Term Goal #1   Patient will be able to report monitoring blood sugar daily and keeping a record over the next 30 days   [facility] CM Short Term Goal #1 Start Date  08/25/18  Interventions for Short Term Goal #2   Reviewed continuing to keep a record of blood sugar, provided [facility] calendar and reviewed how to use log.   [facility] CM Short Term Goal #2   Patient will be  able to verbalize at least 3 guidelines in meal  planning over the next 30 days   [facility] CM Short Term Goal #2 Start Date  09/01/18  Interventions for Short Term Goal #2  Reviewed plate method in diabetes book, discussed importance of including at meals , reviewed examples ,       [name], RN, [place] Management Coordinator  [phone]- Mobile [phone]- [place]

## 2018-09-02 ENCOUNTER — Encounter: Payer: PPO | Attending: Physician Assistant | Admitting: Physician Assistant

## 2018-09-02 DIAGNOSIS — I872 Venous insufficiency (chronic) (peripheral): Secondary | ICD-10-CM | POA: Diagnosis not present

## 2018-09-02 DIAGNOSIS — I10 Essential (primary) hypertension: Secondary | ICD-10-CM | POA: Diagnosis not present

## 2018-09-02 DIAGNOSIS — Z7984 Long term (current) use of oral hypoglycemic drugs: Secondary | ICD-10-CM | POA: Insufficient documentation

## 2018-09-02 DIAGNOSIS — Z8631 Personal history of diabetic foot ulcer: Secondary | ICD-10-CM | POA: Insufficient documentation

## 2018-09-02 DIAGNOSIS — Z09 Encounter for follow-up examination after completed treatment for conditions other than malignant neoplasm: Secondary | ICD-10-CM | POA: Diagnosis not present

## 2018-09-02 DIAGNOSIS — E119 Type 2 diabetes mellitus without complications: Secondary | ICD-10-CM | POA: Insufficient documentation

## 2018-09-02 DIAGNOSIS — G20 Parkinson's disease: Secondary | ICD-10-CM | POA: Diagnosis not present

## 2018-09-02 DIAGNOSIS — Z96651 Presence of right artificial knee joint: Secondary | ICD-10-CM | POA: Insufficient documentation

## 2018-09-02 DIAGNOSIS — L97429 Non-pressure chronic ulcer of left heel and midfoot with unspecified severity: Secondary | ICD-10-CM | POA: Diagnosis not present

## 2018-09-02 DIAGNOSIS — L97419 Non-pressure chronic ulcer of right heel and midfoot with unspecified severity: Secondary | ICD-10-CM | POA: Diagnosis not present

## 2018-09-04 NOTE — Progress Notes (Signed)
Roger Rodriguez, Roger Rodriguez ([id number]) Visit Report for 09/02/2018 Abuse/Suicide Risk Screen Details Patient Name: Roger Rodriguez, Roger Rodriguez Date of Service: 09/02/2018 2:45 PM Medical Record Number: [id number] Patient Account Number: [id number] Date of Birth/Sex: Jul 07, 1941 (77 y.o. M) Treating RN: Montey Hora Primary Care Marck Mcclenny: Cranford Mon, Delfino Lovett Other Clinician: Referring Delara Shepheard: Kurtis Bushman Treating Forney Kleinpeter/Extender: Melburn Hake, HOYT Weeks in Treatment: 0 Abuse/Suicide Risk Screen Items Answer ABUSE/SUICIDE RISK SCREEN: Has anyone close to you tried to hurt or harm you recentlyo No Do you feel uncomfortable with anyone in your familyo No Has anyone forced you do things that you didnot want to doo No Do you have any thoughts of harming yourselfo No Patient displays signs or symptoms of abuse and/or neglect. No Electronic Signature(s) Signed: 09/02/2018 4:37:43 PM By: Montey Hora Entered By: Montey Hora on 09/02/2018 15:02:40 Roger Rodriguez, Roger Rodriguez ([id number]) -------------------------------------------------------------------------------- Activities of Daily Living Details Patient Name: Roger Rodriguez Date of Service: 09/02/2018 2:45 PM Medical Record Number: [id number] Patient Account Number: [id number] Date of Birth/Sex: 04/25/1941 (78 y.o. M) Treating RN: Montey Hora Primary Care Kiyla Ringler: Cranford Mon, Delfino Lovett Other Clinician: Referring Alivia Cimino: Kurtis Bushman Treating Storm Sovine/Extender: Melburn Hake, HOYT Weeks in Treatment: 0 Activities of Daily Living Items Answer Activities of Daily Living (Please select one for each item) Drive Automobile Not Able Take Medications Need Assistance Use Telephone Need Assistance Care for Appearance Need Assistance Use Toilet Need Assistance Bath / Shower Not Able Dress Self Need Assistance Feed Self Completely Able Walk Not Able Get In / Out Bed Need Assistance Housework Not Able Prepare Meals Not Able Handle Money Not Able Shop for Self  Not Able Electronic Signature(s) Signed: 09/02/2018 4:37:43 PM By: Montey Hora Entered By: Montey Hora on 09/02/2018 15:03:19 Roger Rodriguez, Roger Rodriguez ([id number]) -------------------------------------------------------------------------------- Education Assessment Details Patient Name: Roger Rodriguez Date of Service: 09/02/2018 2:45 PM Medical Record Number: [id number] Patient Account Number: [id number] Date of Birth/Sex: [birth year] (77 y.o. M) Treating RN: Montey Hora Primary Care Sitlaly Gudiel: Cranford Mon, Delfino Lovett Other Clinician: Referring Jayleah Garbers: Kurtis Bushman Treating Emmalene Kattner/Extender: Sharalyn Ink in Treatment: 0 Primary Learner Assessed: Patient Learning Preferences/Education Level/Primary Language Learning Preference: Explanation, Demonstration Highest Education Level: High School Preferred Language: English Cognitive Barrier Assessment/Beliefs Language Barrier: No Translator Needed: No Memory Deficit: No Emotional Barrier: No Cultural/Religious Beliefs Affecting Medical Care: No Physical Barrier Assessment Impaired Vision: No Impaired Hearing: No Decreased Hand dexterity: No Knowledge/Comprehension Assessment Knowledge Level: Medium Comprehension Level: Medium Ability to understand written Medium instructions: Ability to understand verbal Medium instructions: Motivation Assessment Anxiety Level: Calm Cooperation: Cooperative Education Importance: Acknowledges Need Interest in Health Problems: Asks Questions Perception: Coherent Willingness to Engage in Self- Medium Management Activities: Readiness to Engage in Self- Medium Management Activities: Electronic Signature(s) Signed: 09/02/2018 4:37:43 PM By: Montey Hora Entered By: Montey Hora on 09/02/2018 15:04:08 Roger Rodriguez, Roger Rodriguez ([id number]) -------------------------------------------------------------------------------- Fall Risk Assessment Details Patient Name: Roger Rodriguez Date of  Service: 09/02/2018 2:45 PM Medical Record Number: [id number] Patient Account Number: [id number] Date of Birth/Sex: 22-Sep-1940 (77 y.o. M) Treating RN: Montey Hora Primary Care Markisha Meding: Cranford Mon, Delfino Lovett Other Clinician: Referring Alice Burnside: Kurtis Bushman Treating Kendrik Mcshan/Extender: Melburn Hake, HOYT Weeks in Treatment: 0 Fall Risk Assessment Items Have you had 2 or more falls in the last 12 monthso 0 No Have you had any fall that resulted in injury in the last 12 monthso 0 No FALL RISK ASSESSMENT: History of falling - immediate or within 3 months 0 No Secondary diagnosis 0 No Ambulatory aid None/bed rest/wheelchair/nurse 0 Yes Crutches/cane/walker  0 No Furniture 0 No IV Access/Saline Lock 0 No Gait/Training Normal/bed rest/immobile 0 No Weak 10 Yes Impaired 20 Yes Mental Status Oriented to own ability 0 Yes Electronic Signature(s) Signed: 09/02/2018 4:37:43 PM By: Montey Hora Entered By: Montey Hora on 09/02/2018 15:05:03 Roger Rodriguez, Roger Rodriguez ([id number]) -------------------------------------------------------------------------------- Foot Assessment Details Patient Name: Roger Rodriguez Date of Service: 09/02/2018 2:45 PM Medical Record Number: [id number] Patient Account Number: [id number] Date of Birth/Sex: Jul 16, 1940 (77 y.o. M) Treating RN: Montey Hora Primary Care Vrinda Heckstall: Cranford Mon, Delfino Lovett Other Clinician: Referring Jalynn Betzold: Kurtis Bushman Treating Enolia Koepke/Extender: Melburn Hake, HOYT Weeks in Treatment: 0 Foot Assessment Items Site Locations + = Sensation present, - = Sensation absent, C = Callus, U = Ulcer R = Redness, W = Warmth, M = Maceration, PU = Pre-ulcerative lesion F = Fissure, S = Swelling, D = Dryness Assessment Right: Left: Other Deformity: No No Prior Foot Ulcer: No No Prior Amputation: No No Charcot Joint: No No Ambulatory Status: Non-ambulatory Assistance Device: Wheelchair GaitEnergy manager) Signed: 09/02/2018 4:37:43  PM By: Montey Hora Entered By: Montey Hora on 09/02/2018 15:11:07 Roger Rodriguez ([id number]) -------------------------------------------------------------------------------- Nutrition Risk Assessment Details Patient Name: Roger Rodriguez Date of Service: 09/02/2018 2:45 PM Medical Record Number: [id number] Patient Account Number: [id number] Date of Birth/Sex: 12-05-1940 (77 y.o. M) Treating RN: Montey Hora Primary Care Lanae Federer: Cranford Mon, Delfino Lovett Other Clinician: Referring Chanah Tidmore: Kurtis Bushman Treating Valicia Rief/Extender: Melburn Hake, HOYT Weeks in Treatment: 0 Height (in): 71 Weight (lbs): 212 Body Mass Index (BMI): 29.6 Nutrition Risk Assessment Items NUTRITION RISK SCREEN: I have an illness or condition that made me change the kind and/or amount of 0 No food I eat I eat fewer than two meals per day 0 No I eat few fruits and vegetables, or milk products 0 No I have three or more drinks of beer, liquor or wine almost every day 0 No I have tooth or mouth problems that make it hard for me to eat 0 No I don't always have enough money to buy the food I need 0 No I eat alone most of the time 0 No I take three or more different prescribed or over-the-counter drugs a day 1 Yes Without wanting to, I have lost or gained 10 pounds in the last six months 0 No I am not always physically able to shop, cook and/or feed myself 0 No Nutrition Protocols Good Risk Protocol 0 No interventions needed Moderate Risk Protocol Electronic Signature(s) Signed: 09/02/2018 4:37:43 PM By: Montey Hora Entered By: Montey Hora on 09/02/2018 15:05:36

## 2018-09-06 NOTE — Progress Notes (Signed)
KIMO, BANCROFT ([id number]) Visit Report for 09/02/2018 Allergy List Details Patient Name: Roger Rodriguez, Roger Rodriguez Date of Service: 09/02/2018 2:45 PM Medical Record Number: [id number] Patient Account Number: [id number] Date of Birth/Sex: 1940-08-26 (78 y.o. M) Treating RN: Montey Hora Primary Care Lawsyn Heiler: Cranford Mon, Delfino Lovett Other Clinician: Referring Emmely Bittinger: Kurtis Bushman Treating Abhijot Straughter/Extender: Melburn Hake, HOYT Weeks in Treatment: 0 Allergies Active Allergies Sulfa (Sulfonamide Antibiotics) Allergy Notes Electronic Signature(s) Signed: 09/02/2018 4:37:43 PM By: Montey Hora Entered By: Montey Hora on 09/02/2018 14:59:36 Razavi, [name] ([id number]) -------------------------------------------------------------------------------- Arrival Information Details Patient Name: Roger Rodriguez Date of Service: 09/02/2018 2:45 PM Medical Record Number: [id number] Patient Account Number: [id number] Date of Birth/Sex: [birth year] (78 y.o. 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 History Since Last Visit Added or deleted any medications: No Any new allergies or adverse reactions: No Had a fall or experienced change in activities of daily living that may affect risk of falls: No Signs or symptoms of abuse/neglect since last visito No Hospitalized since last visit: No Implantable device outside of the clinic excluding cellular tissue based products placed in the center since last visit: No Electronic Signature(s) Signed: 09/02/2018 4:37:43  PM By: Montey Hora Entered By: Montey Hora on 09/02/2018 14:59:25 Lick, [name] ([id number]) -------------------------------------------------------------------------------- Clinic Level of Care Assessment Details Patient Name: Roger Rodriguez Date of Service: 09/02/2018 2:45 PM Medical Record Number: [id number] Patient Account Number: [id number] Date of Birth/Sex: September 21, 1940 (78 y.o. M) Treating RN: Harold Barban Primary Care Derrek Puff: Cranford Mon, Delfino Lovett Other Clinician: Referring Mckensie Scotti: Kurtis Bushman Treating Dilyn Smiles/Extender: Melburn Hake, HOYT Weeks in Treatment: 0 Clinic Level of Care Assessment Items TOOL 4 Quantity Score []  - Use when only an EandM is performed on FOLLOW-UP visit 0 ASSESSMENTS - Nursing Assessment / Reassessment X - Reassessment of Co-morbidities (includes updates in patient status) 1 10 X- 1 5 Reassessment of Adherence to Treatment Plan ASSESSMENTS - Wound and Skin Assessment / Reassessment []  - Simple Wound Assessment / Reassessment - one wound 0 X- 2 5 Complex Wound Assessment / Reassessment - multiple wounds []  - 0 Dermatologic / Skin Assessment (not related to wound area) ASSESSMENTS - Focused Assessment []  - Circumferential Edema Measurements - multi extremities 0 []  - 0 Nutritional Assessment / Counseling / Intervention []  - 0 Lower Extremity Assessment (monofilament, tuning fork, pulses) []  - 0 Peripheral Arterial Disease Assessment (using hand held doppler) ASSESSMENTS - Ostomy and/or Continence Assessment and Care []  - Incontinence Assessment and Management 0 []  - 0 Ostomy Care Assessment and Management (repouching, etc.) PROCESS - Coordination of Care X - Simple Patient / Family Education for ongoing care 1 15 []  - 0 Complex (extensive) Patient / Family Education for ongoing care []  - 0 Staff obtains Programmer, systems, Records, Test Results / Process Orders []  - 0 Staff telephones HHA, Nursing Homes / Clarify orders / etc []  -  0 Routine Transfer to another Facility (non-emergent condition) []  - 0 Routine Hospital Admission (non-emergent condition) []  - 0 New Admissions / Biomedical engineer / Ordering NPWT, Apligraf, etc. []  - 0 Emergency Hospital Admission (emergent condition) X- 1 10 Simple Discharge Coordination [name], [name]. ([id number]) []  - 0 Complex (extensive)  Discharge Coordination PROCESS - Special Needs []  - Pediatric / Minor Patient Management 0 []  - 0 Isolation Patient Management []  - 0 Hearing / Language / Visual special needs []  - 0 Assessment of Community assistance (transportation, D/C planning, etc.) []  - 0 Additional assistance / Altered mentation []  - 0 Support Surface(s) Assessment (bed, cushion, seat, etc.) INTERVENTIONS - Wound Cleansing / Measurement []  - Simple Wound Cleansing - one wound 0 []  - 0 Complex Wound Cleansing - multiple wounds []  - 0 Wound Imaging (photographs - any number of wounds) []  - 0 Wound Tracing (instead of photographs) []  - 0 Simple Wound Measurement - one wound []  - 0 Complex Wound Measurement - multiple wounds INTERVENTIONS - Wound Dressings []  - Small Wound Dressing one or multiple wounds 0 []  - 0 Medium Wound Dressing one or multiple wounds []  - 0 Large Wound Dressing one or multiple wounds []  - 0 Application of Medications - topical []  - 0 Application of Medications - injection INTERVENTIONS - Miscellaneous []  - External ear exam 0 []  - 0 Specimen Collection (cultures, biopsies, blood, body fluids, etc.) []  - 0 Specimen(s) / Culture(s) sent or taken to Lab for analysis []  - 0 Patient Transfer (multiple staff / Civil Service fast streamer / Similar devices) []  - 0 Simple Staple / Suture removal (25 or less) []  - 0 Complex Staple / Suture removal (26 or more) []  - 0 Hypo / Hyperglycemic Management (close monitor of Blood Glucose) []  - 0 Ankle / Brachial Index (ABI) - do not check if billed separately X- 1 5 Vital Signs Bents, Niguel E.  ([id number]) Has the patient been seen at the hospital within the last three years: Yes Total Score: 55 Level Of Care: New/Established - Level 2 Electronic Signature(s) Signed: 09/06/2018 11:13:25 AM By: Harold Barban Entered By: Harold Barban on 09/02/2018 15:36:36 Delaguila, [name] ([id number]) -------------------------------------------------------------------------------- Encounter Discharge Information Details Patient Name: Roger Rodriguez Date of Service: 09/02/2018 2:45 PM Medical Record Number: [id number] Patient Account Number: [id number] Date of Birth/Sex: 1940-07-28 (78 y.o. M) Treating RN: Army Melia Primary Care Nijah Tejera: Wilhemena Durie Other Clinician: Referring Shraddha Lebron: Kurtis Bushman Treating Alfard Cochrane/Extender: Melburn Hake, HOYT Weeks in Treatment: 0 Encounter Discharge Information Items Discharge Condition: Stable Ambulatory Status: Wheelchair Discharge Destination: Home Transportation: Private Auto Accompanied By: family Schedule Follow-up Appointment: No Clinical Summary of Care: Electronic Signature(s) Signed: 09/02/2018 4:05:19 PM By: Army Melia Entered By: Army Melia on 09/02/2018 16:05:19 Copado, [name] ([id number]) -------------------------------------------------------------------------------- Lower Extremity Assessment Details Patient Name: Roger Rodriguez Date of Service: 09/02/2018 2:45 PM Medical Record Number: [id number] Patient Account Number: [id number] Date of Birth/Sex: Jan 10, 1941 (78 y.o. M) Treating RN: Montey Hora Primary Care Faylinn Schwenn: Cranford Mon, Delfino Lovett Other Clinician: Referring Jayona Mccaig: Kurtis Bushman Treating Maisyn Nouri/Extender: Melburn Hake, HOYT Weeks in Treatment: 0 Vascular Assessment Pulses: Dorsalis Pedis Palpable: [Left:Yes] [Right:Yes] Posterior Tibial Palpable: [Left:Yes] [Right:Yes] Extremity colors, hair growth, and conditions: Extremity Color: [Left:Normal] [Right:Normal] Hair Growth on Extremity: [Left:Yes]  [Right:Yes] Temperature of Extremity: [Left:Cool] [Right:Cool] Capillary Refill: [Left:< 3 seconds] [Right:< 3 seconds] Electronic Signature(s) Signed: 09/02/2018 4:37:43 PM By: Montey Hora Entered By: Montey Hora on 09/02/2018 15:14:33 Falero, [name] ([id number]) -------------------------------------------------------------------------------- Multi Wound Chart Details Patient Name: Roger Rodriguez Date of Service: 09/02/2018 2:45 PM Medical Record Number: [id number] Patient Account Number: [id number] Date of Birth/Sex: 06/08/1941 (78 y.o. M) Treating RN: Harold Barban Primary Care Donata Reddick: Cranford Mon, Delfino Lovett Other Clinician: Referring Callin Ashe: Kurtis Bushman Treating Magic Mohler/Extender: Melburn Hake, HOYT Weeks in Treatment: 0 Vital Signs Height(in):  Pulse(bpm): 82 Weight(lbs): Blood Pressure(mmHg): 135/69 Body Mass Index(BMI): Temperature(F): 98.3 Respiratory Rate 16 (breaths/min): Wound Assessments Treatment Notes Electronic Signature(s) Signed: 09/06/2018 11:13:25 AM By: Harold Barban Entered By: Harold Barban on 09/02/2018 15:33:05 ZEBEDIAH, BEEZLEY ([id number]) -------------------------------------------------------------------------------- Multi-Disciplinary Care Plan Details Patient Name: Roger Rodriguez Date of Service: 09/02/2018 2:45 PM Medical Record Number: [id number] Patient Account Number: [id number] Date of Birth/Sex: 1940/10/29 (78 y.o. M) Treating RN: Harold Barban Primary Care Stephen Turnbaugh: Cranford Mon, Delfino Lovett Other Clinician: Referring Hattye Siegfried: Kurtis Bushman Treating Nam Vossler/Extender: Melburn Hake, HOYT Weeks in Treatment: 0 Active Inactive Electronic Signature(s) Signed: 09/06/2018 11:13:25 AM By: Harold Barban Entered By: Harold Barban on 09/02/2018 15:50:27 Reveron, [name] ([id number]) -------------------------------------------------------------------------------- Pain Assessment Details Patient Name: Roger Rodriguez Date of Service:  09/02/2018 2:45 PM Medical Record Number: [id number] Patient Account Number: [id number] Date of Birth/Sex: [birth year] (78 y.o. M) Treating RN: Montey Hora Primary Care Adelina Collard: Cranford Mon, Delfino Lovett Other Clinician: Referring Eugene Zeiders: Kurtis Bushman Treating Lilyanah Celestin/Extender: Melburn Hake, HOYT Weeks in Treatment: 0 Active Problems Location of Pain Severity and Description of Pain Patient Has Paino No Site Locations Pain Management and Medication Current Pain Management: Electronic Signature(s) Signed: 09/02/2018 4:37:43 PM By: Montey Hora Entered By: Montey Hora on 09/02/2018 14:59:31 Carta, [name] ([id number]) -------------------------------------------------------------------------------- Patient/Caregiver Education Details Patient Name: Roger Rodriguez Date of Service: 09/02/2018 2:45 PM Medical Record Number: [id number] Patient Account Number: [id number] Date of Birth/Gender: 19-Jul-1940 (78 y.o. M) Treating RN: Harold Barban Primary Care Physician: Cranford Mon, Delfino Lovett Other Clinician: Referring Physician: Kurtis Bushman Treating Physician/Extender: Sharalyn Ink in Treatment: 0 Education Assessment Education Provided To: Patient Education Topics Provided Wound/Skin Impairment: Handouts: Caring for Your Ulcer Methods: Demonstration, Explain/Verbal Responses: State content correctly Electronic Signature(s) Signed: 09/06/2018 11:13:25 AM By: Harold Barban Entered By: Harold Barban on 09/02/2018 15:33:30 Cillo, [name] ([id number]) -------------------------------------------------------------------------------- Norco Details Patient Name: Roger Rodriguez Date of Service: 09/02/2018 2:45 PM Medical Record Number: [id number] Patient Account Number: [id number] Date of Birth/Sex: December 30, 1940 (78 y.o. M) Treating RN: Montey Hora Primary Care Abdulrahim Siddiqi: Cranford Mon, Delfino Lovett Other Clinician: Referring Crespin Forstrom: Kurtis Bushman Treating Alaina Donati/Extender: Melburn Hake, HOYT Weeks in Treatment: 0 Vital Signs Time Taken: 15:06 Temperature (F): 98.3 Pulse (bpm): 82 Respiratory Rate (breaths/min): 16 Blood Pressure (mmHg): 135/69 Reference Range: 80 - 120 mg / dl Electronic Signature(s) Signed: 09/02/2018 4:37:43 PM By: Montey Hora Entered By: Montey Hora on 09/02/2018 15:09:14

## 2018-09-06 NOTE — Progress Notes (Signed)
Roger, Rodriguez ([id number]) Visit Report for 09/02/2018 Chief Complaint Document Details Patient Name: Roger Rodriguez, Roger Rodriguez Date of Service: 09/02/2018 2:45 PM Medical Record Number: [id number] Patient Account Number: [id number] Date of Birth/Sex: July 24, 1940 (78 y.o. M) Treating RN: Harold Barban Primary Care Provider: Cranford Mon, Delfino Lovett Other Clinician: Referring Provider: Kurtis Bushman Treating Provider/Extender: Melburn Hake, Derreon Consalvo Weeks in Treatment: 0 Information Obtained from: Patient Chief Complaint Resolved bilateral heel ulcers Electronic Signature(s) Signed: 09/02/2018 4:27:09 PM By: Worthy Keeler PA-C Entered By: Worthy Keeler on 09/02/2018 15:24:33 Hannibal, [name] ([id number]) -------------------------------------------------------------------------------- HPI Details Patient Name: Roger Rodriguez Date of Service: 09/02/2018 2:45 PM Medical Record Number: [id number] Patient Account Number: [id number] Date of Birth/Sex: 28-Jan-1941 (78 y.o. M) Treating RN: Harold Barban Primary Care Provider: Cranford Mon, Delfino Lovett Other Clinician: Referring Provider: Kurtis Bushman Treating Provider/Extender: Melburn Hake, Maui Britten Weeks in Treatment: 0 History of Present Illness HPI Description: 03/11/18 on evaluation today patient presents for initial inspection our clinic concerning what was stated to be a heel ulcer on the left. With that being said that is completely closed at this point there does not appear to be any evidence of issues currently. Nonetheless the bigger issue that I see upon evaluation today is that he has a significant area of erythema of the right knee unfortunately with a fairly large flush when abscess on the lateral portion of the knee. He did have a triple phase of bone scan performed yesterday which showed evidence of findings consistent with either a septic loosening or infection of the right knee prosthesis. With this abscess present today I do believe this is likely due  to infection. This was discussed with patient. No fevers, chills, nausea, or vomiting noted at this time. He has been having some discomfort but states that the pain is not terrible. When I push over the abscess area he did have a little bit of discomfort at this point. However he did not really have much in the way of sharp sensation in fact he did not even need me to numb it when I tested him with the scalpel he was not feeling the sharp sensation over the abscess area. He does have a history of Parkinson's disease, hypertension, chronic venous insufficiency, and having had a previous right knee replacement. 05/09/18 on evaluation today patient presents for reevaluation in our clinic regarding issues that he has been having with his right knee. I initially saw him on 03/11/18 for one visit where he had an abscess on the lateral portion of his right knee. Subsequently due to the fact that this probe down to bone I made a referral by way of call to [facility] here in [facility]. Subsequently the patient was advised to go to the ER to see the on call physician. This ended up being Dr. Kurtis Bushman who has been caring for him since that time. According to review the records it appears that the patient did have his right total knee removal and placement of an antibiotic spacer on 03/17/18. He is also been on IV antibiotic therapy which ended on 05/02/18. The plan at this point is that the patient needs to have the lateral wound on his right lower extremity completely healed prior to any reimplantation of the new prosthesis. The patient has a follow-up appointment with [facility] on 05/17/18 for aspiration of the right knee to be sent for Gram stain, culture, and sensitivity. Overall he was referred to [place] here wound care to help with healing this  lateral wound in order to be prepared and ready for the new knee replacement. I did speak with Dr. Harlow Mares myself today 05/09/18 and he is releasing  the patient's care to [place] as part of the postop global in order for [place] to aid in getting this wound to heal. Upon inspection today the wound actually appears to be fairly superficial and somewhat hyper granular but fortunately there does not appear to be any evidence of overt infection at this point in time. 05/16/18 on evaluation today patient actually appears to be doing wonderful in regard to his right lateral ankle ulcer. He has been tolerating the dressing changes without complication. With that being said upon evaluation today it appears that the wound is completely healed. There was a small area on the inferior portion of the wound bed that when the eschar that was overlying was removed pulled back a small portion of the new skin causing it to bleed minimally. Nonetheless there was not anything significant at this point to be worried about as far as I was concerned. I am gonna likely see him back for one week follow-up just to ensure everything is fine but I truly believe it will be healed before then. Readmission: 09/02/18 on evaluation today patient appears to be doing actually decently well in my opinion compared to when I last saw him. He has subsequently had his knee replacement permanently implanted since I last saw him. Unfortunately he still is not up and walking on this as well as he should be. I'm not really sure at all the details as to why but nonetheless he has therapy working with them at home after he did have a stent in rehab as well. [name] wounds that he was sent [place] to review actually are his bilateral heels although upon evaluation today there does not appear to be any open wounds at this point. I definitely see areas where it appears that there was something that one point but again this is no longer seeming to be the case as far as anything being open anyway. He does have a Prevalon like offloading boots that he uses at home and also is using skin prep of the heels  which I think is appropriate. I think the biggest issue is that is really not up and walking around and therefore he's having trouble with his heels and pressure. 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 Subsequently I think that he may be a candidate for the [place] 4000 which would be COTTON, BECKLEY. ([id number]) a Velcro compression wrap to avoid having used compression stockings while still having the compression therapy that is needed. The patient thinks this may be something he will be interested in. Electronic Signature(s) Signed: 09/02/2018 4:27:09 PM By: Worthy Keeler PA-C Entered By: Worthy Keeler on 09/02/2018 16:23:35 ROEMELLO, SPEYER ([id number]) -------------------------------------------------------------------------------- Physical Exam Details Patient Name: Roger Rodriguez Date of Service: 09/02/2018 2:45 PM Medical Record Number: [id number] Patient Account Number: [id number] Date of Birth/Sex: 03-28-1941 (77 y.o. M) Treating RN: Harold Barban Primary Care Provider: Cranford Mon, Delfino Lovett Other Clinician: Referring Provider: Kurtis Bushman Treating Provider/Extender: Melburn Hake, Analaya Hoey Weeks in Treatment: 0 Constitutional patient is hypertensive.. pulse regular and within target range for patient.[name] respirations regular, non-labored and within target range for patient.[name] temperature within  target range for patient.. Well-nourished and well-hydrated in no acute distress. Eyes conjunctiva clear no eyelid edema noted. pupils equal round and reactive to light and accommodation. Ears, Nose, Mouth, and Throat no gross abnormality of ear auricles or external auditory canals. normal hearing noted during conversation. mucus membranes moist. Respiratory normal breathing without difficulty. clear to  auscultation bilaterally. Cardiovascular regular rate and rhythm with normal S1, S2. 2+ dorsalis pedis/posterior tibialis pulses. no clubbing, cyanosis, significant edema, <3 sec cap refill. Gastrointestinal (GI) soft, non-tender, non-distended, +BS. no ventral hernia noted. Musculoskeletal Patient unable to walk without assistance. no significant deformity or arthritic changes, no loss or range of motion, no clubbing. Psychiatric this patient is able to make decisions and demonstrates good insight into disease process. Alert and Oriented x 3. pleasant and cooperative. Notes At this point I did examine patient's heels thoroughly there are no open wounds at this point I definitely see where he had previous issues as such but again nothing active. I do think that skin prep of the region is good to keep things protected and I do believe that in general he seems to be doing well from a wound care standpoint. Obviously he still not up and walking as well as they would like as far as from an orthopedic knee replacement standpoint is concerned. Electronic Signature(s) Signed: 09/02/2018 4:27:09 PM By: Worthy Keeler PA-C Entered By: Worthy Keeler on 09/02/2018 16:24:17 EAMON, TANTILLO ([id number]) -------------------------------------------------------------------------------- Physician Orders Details Patient Name: Roger Rodriguez Date of Service: 09/02/2018 2:45 PM Medical Record Number: [id number] Patient Account Number: [id number] Date of Birth/Sex: 24-Jun-1941 (77 y.o. M) Treating RN: Harold Barban Primary Care Provider: Cranford Mon, Delfino Lovett Other Clinician: Referring Provider: Kurtis Bushman Treating Provider/Extender: Melburn Hake, Sayward Horvath Weeks in Treatment: 0 Verbal / Phone Orders: No Diagnosis Coding ICD-10 Coding Code Description I87.2 Venous insufficiency (chronic) (peripheral) G20 Parkinson's disease I10 Essential (primary) hypertension E11.621 Type 2 diabetes mellitus with foot  ulcer Discharge From [facility] o Discharge from [place] prep to heels and off-loading heels. Call with any questions. Electronic Signature(s) Signed: 09/02/2018 4:27:09 PM By: Worthy Keeler PA-C Signed: 09/06/2018 11:13:25 AM By: Harold Barban Entered By: Harold Barban on 09/02/2018 15:49:47 AYOMIKUN, STARLING ([id number]) -------------------------------------------------------------------------------- Problem List Details Patient Name: Roger Rodriguez Date of Service: 09/02/2018 2:45 PM Medical Record Number: [id number] Patient Account Number: [id number] Date of Birth/Sex: Nov 26, 1940 (78 y.o. M) Treating RN: Harold Barban Primary Care Provider: Cranford Mon, Delfino Lovett Other Clinician: Referring Provider: Kurtis Bushman Treating Provider/Extender: Melburn Hake, Andrena Margerum Weeks in Treatment: 0 Active Problems ICD-10 Evaluated Encounter Code Description Active Date Today Diagnosis I87.2 Venous insufficiency (chronic) (peripheral) 09/02/2018 No Yes G20 Parkinson's disease 09/02/2018 No Yes I10 Essential (primary) hypertension 09/02/2018 No Yes E11.621 Type 2 diabetes mellitus with foot ulcer 09/02/2018 No Yes Inactive Problems Resolved Problems Electronic Signature(s) Signed: 09/02/2018 4:27:09 PM By: Worthy Keeler PA-C Entered By: Worthy Keeler on 09/02/2018 15:22:01 DREVON, PLOG ([id number]) -------------------------------------------------------------------------------- Progress Note Details Patient Name: Roger Rodriguez Date of Service: 09/02/2018 2:45 PM Medical Record Number: [id number] Patient Account Number: [id number] Date of Birth/Sex: 1940/09/26 (77 y.o. M) Treating RN: Harold Barban Primary Care Provider: Cranford Mon, Delfino Lovett Other Clinician: Referring Provider: Kurtis Bushman Treating Provider/Extender: Melburn Hake, Gianelle Mccaul Weeks in Treatment: 0 Subjective Chief Complaint Information obtained from Patient Resolved bilateral heel ulcers History of Present  Illness (HPI) 03/11/18 on evaluation today patient presents for initial inspection our clinic concerning what was stated to  be a heel ulcer on the left. With that being said that is completely closed at this point there does not appear to be any evidence of issues currently. Nonetheless the bigger issue that I see upon evaluation today is that he has a significant area of erythema of the right knee unfortunately with a fairly large flush when abscess on the lateral portion of the knee. He did have a triple phase of bone scan performed yesterday which showed evidence of findings consistent with either a septic loosening or infection of the right knee prosthesis. With this abscess present today I do believe this is likely due to infection. This was discussed with patient. No fevers, chills, nausea, or vomiting noted at this time. He has been having some discomfort but states that the pain is not terrible. When I push over the abscess area he did have a little bit of discomfort at this point. However he did not really have much in the way of sharp sensation in fact he did not even need me to numb it when I tested him with the scalpel he was not feeling the sharp sensation over the abscess area. He does have a history of Parkinson's disease, hypertension, chronic venous insufficiency, and having had a previous right knee replacement. 05/09/18 on evaluation today patient presents for reevaluation in our clinic regarding issues that he has been having with his right knee. I initially saw him on 03/11/18 for one visit where he had an abscess on the lateral portion of his right knee. Subsequently due to the fact that this probe down to bone I made a referral by way of call to [facility] here in [facility]. Subsequently the patient was advised to go to the ER to see the on call physician. This ended up being Dr. Kurtis Bushman who has been caring for him since that time. According to review the  records it appears that the patient did have his right total knee removal and placement of an antibiotic spacer on 03/17/18. He is also been on IV antibiotic therapy which ended on 05/02/18. The plan at this point is that the patient needs to have the lateral wound on his right lower extremity completely healed prior to any reimplantation of the new prosthesis. The patient has a follow-up appointment with [facility] on 05/17/18 for aspiration of the right knee to be sent for Gram stain, culture, and sensitivity. Overall he was referred to [place] here wound care to help with healing this lateral wound in order to be prepared and ready for the new knee replacement. I did speak with Dr. Harlow Mares myself today 05/09/18 and he is releasing the patient's care to [place] as part of the postop global in order for [place] to aid in getting this wound to heal. Upon inspection today the wound actually appears to be fairly superficial and somewhat hyper granular but fortunately there does not appear to be any evidence of overt infection at this point in time. 05/16/18 on evaluation today patient actually appears to be doing wonderful in regard to his right lateral ankle ulcer. He has been tolerating the dressing changes without complication. With that being said upon evaluation today it appears that the wound is completely healed. There was a small area on the inferior portion of the wound bed that when the eschar that was overlying was removed pulled back a small portion of the new skin causing it to bleed minimally. Nonetheless there was not anything significant at this point to  be worried about as far as I was concerned. I am gonna likely see him back for one week follow-up just to ensure everything is fine but I truly believe it will be healed before then. Readmission: 09/02/18 on evaluation today patient appears to be doing actually decently well in my opinion compared to when I last saw him. He has subsequently had his knee  replacement permanently implanted since I last saw him. Unfortunately he still is not up and walking on this as well as he should be. I'm not really sure at all the details as to why but nonetheless he has therapy working with them at home after he did have a stent in rehab as well. [name] wounds that he was sent [place] to review actually are his bilateral heels although upon evaluation today there does not appear to be any open wounds at this point. XANE, AMSDEN ([id number]) definitely see areas where it appears that there was something that one point but again this is no longer seeming to be the case as far as anything being open anyway. He does have a Prevalon like offloading boots that he uses at home and also is using skin prep of the heels which I think is appropriate. I think the biggest issue is that is really not up and walking around and therefore he's having trouble with his heels and pressure. 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 Subsequently I think that he may be a candidate for the [place] 4000 which would be a Velcro compression wrap to avoid having used compression stockings while still having the compression therapy that is needed. The patient thinks this may be something he will be interested in. Patient History Information obtained from Patient. Allergies Sulfa (Sulfonamide Antibiotics) Family History Cancer - Siblings, Heart Disease - Mother, Stroke - Father, No family history of Diabetes, Hypertension, Kidney Disease, Lung Disease, Seizures, Thyroid Problems, Tuberculosis. Social History Never smoker, Marital Status - Widowed, Alcohol Use - Never, Drug Use - No History, Caffeine Use - Moderate. Medical History Eyes Patient has history of Cataracts - removed bilateral Denies history  of Glaucoma, Optic Neuritis Ear/Nose/Mouth/Throat Patient has history of Chronic sinus problems/congestion Denies history of Middle ear problems Hematologic/Lymphatic Patient has history of Lymphedema Denies history of Anemia, Hemophilia, Human Immunodeficiency Virus, Sickle Cell Disease Respiratory Denies history of Aspiration, Asthma, Chronic Obstructive Pulmonary Disease (COPD), Pneumothorax, Sleep Apnea, Tuberculosis Cardiovascular Patient has history of Hypertension Denies history of Angina, Arrhythmia, Congestive Heart Failure, Coronary Artery Disease, Deep Vein Thrombosis, Hypotension, Myocardial Infarction, Peripheral Arterial Disease, Peripheral Venous Disease, Phlebitis, Vasculitis Gastrointestinal Denies history of Cirrhosis , Colitis, Crohn s, Hepatitis A, Hepatitis B, Hepatitis C Endocrine Patient has history of Type II Diabetes Denies history of Type I Diabetes Genitourinary Denies history of End Stage Renal Disease Immunological Denies history of Lupus Erythematosus, Raynaud s Integumentary (Skin) Denies history of History of Burn, History of pressure wounds Musculoskeletal Patient has history of Osteoarthritis Denies history of Gout, Rheumatoid Arthritis, Osteomyelitis Neurologic Denies history of Dementia, Neuropathy, Quadriplegia, Paraplegia, Seizure Disorder Oncologic Denies history of Received Chemotherapy, Received Radiation Psychiatric DAMEK, ENDE ([id number]) Denies history of Anorexia/bulimia, Confinement Anxiety Patient is treated with Oral Agents. Medical And Surgical History Notes Neurologic Parkinson's Review of Systems (ROS) Eyes Complains or has symptoms of  Glasses / Contacts - glasses. Denies complaints or symptoms of Dry Eyes, Vision Changes. Ear/Nose/Mouth/Throat Denies complaints or symptoms of Difficult clearing ears, Sinusitis. Hematologic/Lymphatic Denies complaints or symptoms of Bleeding / Clotting Disorders, Human  Immunodeficiency Virus. Respiratory Denies complaints or symptoms of Chronic or frequent coughs, Shortness of Breath. Cardiovascular Complains or has symptoms of LE edema. Denies complaints or symptoms of Chest pain. Gastrointestinal Denies complaints or symptoms of Frequent diarrhea, Nausea, Vomiting. Endocrine Denies complaints or symptoms of Hepatitis, Thyroid disease, Polydypsia (Excessive Thirst). Genitourinary Denies complaints or symptoms of Kidney failure/ Dialysis, Incontinence/dribbling. Immunological Denies complaints or symptoms of Hives, Itching. Integumentary (Skin) Complains or has symptoms of Wounds. Denies complaints or symptoms of Bleeding or bruising tendency, Breakdown, Swelling. Musculoskeletal Denies complaints or symptoms of Muscle Pain, Muscle Weakness. Neurologic Denies complaints or symptoms of Numbness/parasthesias, Focal/Weakness. Psychiatric Denies complaints or symptoms of Anxiety, Claustrophobia. Objective Constitutional patient is hypertensive.. pulse regular and within target range for patient.[name] respirations regular, non-labored and within target range for patient.[name] temperature within target range for patient.. Well-nourished and well-hydrated in no acute distress. Vitals Time Taken: 3:06 PM, Temperature: 98.3 F, Pulse: 82 bpm, Respiratory Rate: 16 breaths/min, Blood Pressure: 135/69 mmHg. Eyes conjunctiva clear no eyelid edema noted. pupils equal round and reactive to light and accommodation. KEVION, FATHEREE ([id number]) Ears, Nose, Mouth, and Throat no gross abnormality of ear auricles or external auditory canals. normal hearing noted during conversation. mucus membranes moist. Respiratory normal breathing without difficulty. clear to auscultation bilaterally. Cardiovascular regular rate and rhythm with normal S1, S2. 2+ dorsalis pedis/posterior tibialis pulses. no clubbing, cyanosis, significant edema, Gastrointestinal (GI) soft,  non-tender, non-distended, +BS. no ventral hernia noted. Musculoskeletal Patient unable to walk without assistance. no significant deformity or arthritic changes, no loss or range of motion, no clubbing. Psychiatric this patient is able to make decisions and demonstrates good insight into disease process. Alert and Oriented x 3. pleasant and cooperative. General Notes: At this point I did examine patient's heels thoroughly there are no open wounds at this point I definitely see where he had previous issues as such but again nothing active. I do think that skin prep of the region is good to keep things protected and I do believe that in general he seems to be doing well from a wound care standpoint. Obviously he still not up and walking as well as they would like as far as from an orthopedic knee replacement standpoint is concerned. Assessment Active Problems ICD-10 Venous insufficiency (chronic) (peripheral) Parkinson's disease Essential (primary) hypertension Type 2 diabetes mellitus with foot ulcer Plan Discharge From [facility]: Discharge from [place] prep to heels and off-loading heels. Call with any questions. I'm a recommend that we go ahead and initiate the above recommendations we did or the [place] 4000 Velcro wraps for him which I think will be beneficial as well. I think he needs to have compression that he was pretty much on a daily basis. Subsequently my suggestion also was that he continue to utilize the skin prep and keep his heels offloaded if anything WASHINGTON, WHEDBEE. ([id number]) changes or worsens they can always contact the office and let me know otherwise no further follow-up appointments are necessary at this point. He was appreciative of the information and time today. I will see him back for reevaluation as needed. Electronic Signature(s) Signed: 09/02/2018 4:27:09 PM By: Worthy Keeler PA-C Entered By: Worthy Keeler on 09/02/2018  16:25:03 Utecht, [name] ([id number]) -------------------------------------------------------------------------------- ROS/PFSH  Details Patient Name: TAVEON, ENYEART Date of Service: 09/02/2018 2:45 PM Medical Record Number: [id number] Patient Account Number: [id number] Date of Birth/Sex: 1941-03-08 (77 y.o. M) Treating RN: Montey Hora Primary Care Provider: Cranford Mon, Delfino Lovett Other Clinician: Referring Provider: Kurtis Bushman Treating Provider/Extender: Melburn Hake, Latori Beggs Weeks in Treatment: 0 Information Obtained From Patient Wound History Eyes Complaints and Symptoms: Positive for: Glasses / Contacts - glasses Negative for: Dry Eyes; Vision Changes Medical History: Positive for: Cataracts - removed bilateral Negative for: Glaucoma; Optic Neuritis Ear/Nose/Mouth/Throat Complaints and Symptoms: Negative for: Difficult clearing ears; Sinusitis Medical History: Positive for: Chronic sinus problems/congestion Negative for: Middle ear problems Hematologic/Lymphatic Complaints and Symptoms: Negative for: Bleeding / Clotting Disorders; Human Immunodeficiency Virus Medical History: Positive for: Lymphedema Negative for: Anemia; Hemophilia; Human Immunodeficiency Virus; Sickle Cell Disease Respiratory Complaints and Symptoms: Negative for: Chronic or frequent coughs; Shortness of Breath Medical History: Negative for: Aspiration; Asthma; Chronic Obstructive Pulmonary Disease (COPD); Pneumothorax; Sleep Apnea; Tuberculosis Cardiovascular Complaints and Symptoms: Positive for: LE edema Negative for: Chest pain Medical History: Positive for: Hypertension Negative for: Angina; Arrhythmia; Congestive Heart Failure; Coronary Artery Disease; Deep Vein Thrombosis; Hypotension; DUEY, LILLER. ([id number]) Myocardial Infarction; Peripheral Arterial Disease; Peripheral Venous Disease; Phlebitis; Vasculitis Gastrointestinal Complaints and Symptoms: Negative for: Frequent diarrhea;  Nausea; Vomiting Medical History: Negative for: Cirrhosis ; Colitis; Crohnos; Hepatitis A; Hepatitis B; Hepatitis C Endocrine Complaints and Symptoms: Negative for: Hepatitis; Thyroid disease; Polydypsia (Excessive Thirst) Medical History: Positive for: Type II Diabetes Negative for: Type I Diabetes Treated with: Oral agents Genitourinary Complaints and Symptoms: Negative for: Kidney failure/ Dialysis; Incontinence/dribbling Medical History: Negative for: End Stage Renal Disease Immunological Complaints and Symptoms: Negative for: Hives; Itching Medical History: Negative for: Lupus Erythematosus; Raynaudos Integumentary (Skin) Complaints and Symptoms: Positive for: Wounds Negative for: Bleeding or bruising tendency; Breakdown; Swelling Medical History: Negative for: History of Burn; History of pressure wounds Musculoskeletal Complaints and Symptoms: Negative for: Muscle Pain; Muscle Weakness Medical History: Positive for: Osteoarthritis Negative for: Gout; Rheumatoid Arthritis; Osteomyelitis Neurologic Complaints and Symptoms: Negative for: Numbness/parasthesias; Focal/Weakness FRANCHOT, POLLITT ([id number]) Medical History: Negative for: Dementia; Neuropathy; Quadriplegia; Paraplegia; Seizure Disorder Past Medical History Notes: Parkinson's Psychiatric Complaints and Symptoms: Negative for: Anxiety; Claustrophobia Medical History: Negative for: Anorexia/bulimia; Confinement Anxiety Oncologic Medical History: Negative for: Received Chemotherapy; Received Radiation HBO Extended History Items Eyes: Ear/Nose/Mouth/Throat: Cataracts Chronic sinus problems/congestion Immunizations Pneumococcal Vaccine: Received Pneumococcal Vaccination: Yes Immunization Notes: up to date Implantable Devices Family and Social History Cancer: Yes - Siblings; Diabetes: No; Heart Disease: Yes - Mother; Hypertension: No; Kidney Disease: No; Lung Disease: No; Seizures: No; Stroke: Yes -  Father; Thyroid Problems: No; Tuberculosis: No; Never smoker; Marital Status - Widowed; Alcohol Use: Never; Drug Use: No History; Caffeine Use: Moderate; Financial Concerns: No; Food, Clothing or Shelter Needs: No; Support System Lacking: No; Transportation Concerns: No; Advanced Directives: Yes (Not Provided); Patient does not want information on Advanced Directives; Do not resuscitate: No; Living Will: Yes (Not Provided); Medical Power of Attorney: Yes (Not Provided) Electronic Signature(s) Signed: 09/02/2018 4:27:09 PM By: Worthy Keeler PA-C Signed: 09/02/2018 4:37:43 PM By: Montey Hora Entered By: Montey Hora on 09/02/2018 15:02:34 PAYAM, GRIBBLE ([id number]) -------------------------------------------------------------------------------- [place] Details Patient Name: Roger Rodriguez Date of Service: 09/02/2018 Medical Record Number: [id number] Patient Account Number: [id number] Date of Birth/Sex: Jun 16, 1941 (78 y.o. M) Treating RN: Harold Barban Primary Care Provider: Cranford Mon, Delfino Lovett Other Clinician: Referring Provider: Kurtis Bushman Treating Provider/Extender: Melburn Hake, Elgene Coral Weeks in Treatment: 0 Diagnosis Coding ICD-10  Codes Code Description I87.2 Venous insufficiency (chronic) (peripheral) G20 Parkinson's disease I10 Essential (primary) hypertension E11.621 Type 2 diabetes mellitus with foot ulcer Facility Procedures CPT4 Code: [id number] Description: [phone] - WOUND CARE VISIT-LEV 2 EST PT Modifier: Quantity: 1 Physician Procedures CPT4 Code: 2229798 Description: 92119 - WC PHYS LEVEL 4 - EST PT ICD-10 Diagnosis Description I87.2 Venous insufficiency (chronic) (peripheral) G20 Parkinson's disease I10 Essential (primary) hypertension E11.621 Type 2 diabetes mellitus with foot ulcer Modifier: Quantity: 1 Electronic Signature(s) Signed: 09/02/2018 4:27:09 PM By: Worthy Keeler PA-C Entered By: Worthy Keeler on 09/02/2018 16:25:13

## 2018-09-08 ENCOUNTER — Telehealth: Payer: Self-pay | Admitting: Family Medicine

## 2018-09-08 NOTE — Telephone Encounter (Signed)
Roger Rodriguez, PT w/ [facility] - [phone]  Requesting: 1 week extension for 2 PT visits  Please advise.  Thanks, [employer]

## 2018-09-08 NOTE — Telephone Encounter (Signed)
Advised 

## 2018-09-12 DIAGNOSIS — G2581 Restless legs syndrome: Secondary | ICD-10-CM | POA: Diagnosis not present

## 2018-09-12 DIAGNOSIS — Z791 Long term (current) use of non-steroidal anti-inflammatories (NSAID): Secondary | ICD-10-CM | POA: Diagnosis not present

## 2018-09-12 DIAGNOSIS — Z79891 Long term (current) use of opiate analgesic: Secondary | ICD-10-CM | POA: Diagnosis not present

## 2018-09-12 DIAGNOSIS — J309 Allergic rhinitis, unspecified: Secondary | ICD-10-CM | POA: Diagnosis not present

## 2018-09-12 DIAGNOSIS — T8453XD Infection and inflammatory reaction due to internal right knee prosthesis, subsequent encounter: Secondary | ICD-10-CM | POA: Diagnosis not present

## 2018-09-12 DIAGNOSIS — I129 Hypertensive chronic kidney disease with stage 1 through stage 4 chronic kidney disease, or unspecified chronic kidney disease: Secondary | ICD-10-CM | POA: Diagnosis not present

## 2018-09-12 DIAGNOSIS — N189 Chronic kidney disease, unspecified: Secondary | ICD-10-CM | POA: Diagnosis not present

## 2018-09-12 DIAGNOSIS — H04123 Dry eye syndrome of bilateral lacrimal glands: Secondary | ICD-10-CM | POA: Diagnosis not present

## 2018-09-12 DIAGNOSIS — Z8546 Personal history of malignant neoplasm of prostate: Secondary | ICD-10-CM | POA: Diagnosis not present

## 2018-09-12 DIAGNOSIS — F329 Major depressive disorder, single episode, unspecified: Secondary | ICD-10-CM | POA: Diagnosis not present

## 2018-09-12 DIAGNOSIS — Z87442 Personal history of urinary calculi: Secondary | ICD-10-CM | POA: Diagnosis not present

## 2018-09-12 DIAGNOSIS — N3281 Overactive bladder: Secondary | ICD-10-CM | POA: Diagnosis not present

## 2018-09-12 DIAGNOSIS — R32 Unspecified urinary incontinence: Secondary | ICD-10-CM | POA: Diagnosis not present

## 2018-09-12 DIAGNOSIS — Z7982 Long term (current) use of aspirin: Secondary | ICD-10-CM | POA: Diagnosis not present

## 2018-09-12 DIAGNOSIS — M1990 Unspecified osteoarthritis, unspecified site: Secondary | ICD-10-CM | POA: Diagnosis not present

## 2018-09-12 DIAGNOSIS — R471 Dysarthria and anarthria: Secondary | ICD-10-CM | POA: Diagnosis not present

## 2018-09-12 DIAGNOSIS — Z96651 Presence of right artificial knee joint: Secondary | ICD-10-CM | POA: Diagnosis not present

## 2018-09-12 DIAGNOSIS — G4709 Other insomnia: Secondary | ICD-10-CM | POA: Diagnosis not present

## 2018-09-12 DIAGNOSIS — G20 Parkinson's disease: Secondary | ICD-10-CM | POA: Diagnosis not present

## 2018-09-15 ENCOUNTER — Other Ambulatory Visit: Payer: Self-pay | Admitting: Family Medicine

## 2018-09-17 DIAGNOSIS — T8453XD Infection and inflammatory reaction due to internal right knee prosthesis, subsequent encounter: Secondary | ICD-10-CM | POA: Diagnosis not present

## 2018-09-17 DIAGNOSIS — M6281 Muscle weakness (generalized): Secondary | ICD-10-CM | POA: Diagnosis not present

## 2018-09-17 DIAGNOSIS — R262 Difficulty in walking, not elsewhere classified: Secondary | ICD-10-CM | POA: Diagnosis not present

## 2018-09-17 DIAGNOSIS — G20 Parkinson's disease: Secondary | ICD-10-CM | POA: Diagnosis not present

## 2018-09-17 DIAGNOSIS — M9711XS Periprosthetic fracture around internal prosthetic right knee joint, sequela: Secondary | ICD-10-CM | POA: Diagnosis not present

## 2018-09-22 ENCOUNTER — Other Ambulatory Visit: Payer: Self-pay | Admitting: *Deleted

## 2018-09-22 NOTE — Patient Outreach (Signed)
[facility]) Care Management  09/22/2018  Roger Rodriguez 06-29-1941 [id number]  Recent Admission to [facility] on 11/27-11/29 , for planned Revision of right total knee.  Discharged home from [facility] on 1/9.  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   Unsuccessful outreach call to patient , no answer able to leave a HIPAA compliant message for return call.  Plan Will plan return call in the next 4 business day, if no return call    Roger Draft, RN, [place] Management Coordinator  [phone]- Mobile [phone]- [place]

## 2018-09-23 ENCOUNTER — Other Ambulatory Visit: Payer: Self-pay | Admitting: *Deleted

## 2018-09-23 NOTE — Patient Outreach (Signed)
[facility] [facility]) Care Management  09/23/2018  Roger Rodriguez Jun 28, 1941 [id number]   Telephone call attempt.   Unsuccessful call attempt to patient to discuss transition to [facility] embedded Chronic care manager, Trish Fountain  at [place] office . No answer at phone call attempt on today, able to leave a message for return call .   I have discussed case with Trish Fountain, Chronic care manager .   Plan Will plan return call in the next week as already scheduled.   [name], RN, [place] Management Coordinator  [phone]- Mobile [phone]- Toll Free Main Office

## 2018-09-27 ENCOUNTER — Ambulatory Visit: Payer: Self-pay | Admitting: Pharmacist

## 2018-09-27 DIAGNOSIS — G20 Parkinson's disease: Secondary | ICD-10-CM

## 2018-09-27 DIAGNOSIS — G250 Essential tremor: Secondary | ICD-10-CM

## 2018-09-28 ENCOUNTER — Encounter: Payer: Self-pay | Admitting: Pharmacist

## 2018-09-28 ENCOUNTER — Other Ambulatory Visit: Payer: Self-pay | Admitting: *Deleted

## 2018-09-28 NOTE — Progress Notes (Signed)
This encounter was created in error - please disregard.

## 2018-09-28 NOTE — Chronic Care Management (AMB) (Signed)
Chronic Care Management   Initial Visit Note  09/28/2018 Name: Roger Rodriguez MRN: [id number] DOB: 09/27/1940  Referred by: [name]., MD Reason for referral : Chronic Care Management (Rytary)   Roger Rodriguez is a 78 y.o. year old male who is a primary care patient of [name]., MD. [name] was a [facility] community patient of pharmacist [name]. [name] contacted [name] with an issue regarding his Rytary grant through the [employer].   Assessment: [name] reports that he believes his [place] through [employer] may have expired. He has a couple of days left of his current fill but needs a refill and can't afford copay.   1. [facility] to check if grant had run out. [facility] technician states that grant/coupon is "expired".   2. Placed call to [employer], spoke to representative [place]. Lelon Huh states that the grant was marked inactive due to non use. The grant expired 07/07/18. I explained that [name] had had knee surgeries that required him to stay in rehab facilities for extended periods of time. Laquisha transferred me to the claims department where I spoke to representative Butch Penny. Butch Penny assisted me in reactivating [name] grant. Ticket #[phone]. Reactivation can take up to 7 business days. [name] has approximately $3000 left on grant.   3. [facility]ed [facility] to please begin using grant again for [place]. Please leave a note on his file. Technician verbalized understanding but [facility] has at times not charged his grant in the past.   4. Contacted [name] to update him on his grant's status. If he runs out of [place] and needs to refill before the grant reactivation goes through, I will assist him with filing a reimbursement claim from [facility].   Goals Addressed            This Visit's Progress   . I think my [place] ran out (pt-stated)       Current Barriers:  .  Diminished manual dexterity . Financial Barriers  Pharmacist Clinical Goal(s):  [name] Over the next 10 days, patient will work with [place] to address needs related to [place] and [employer]  Interventions: . Collaboration with [facility]   . Collaboration with [employer]   Patient Self Care Activities:  . Calls pharmacy for medication refills- making sure that they are billing his grant through the foundation  Initial goal documentation         [name] was given information about Chronic Care Management services today including:  1. CCM service includes personalized support from designated clinical staff supervised by his physician, including individualized plan of care and coordination with other care providers 2. 24/7 contact phone numbers for assistance for urgent and routine care needs. 3. Service will only be billed when office clinical staff spend 20 minutes or more in a month to coordinate care. 4. Only one practitioner may furnish and bill the service in a calendar month. 5. The patient may stop CCM services at any time (effective at the end of the month) by phone call to the office staff. 6. The patient will be responsible for cost sharing (co-pay) of up to 20% of the service fee (after annual deductible is met).  Patient agreed to services and verbal consent obtained.   Telephone follow up appointment with CCM team member scheduled for: 3-5 days; assist patient with filing reimbursement for Rytary  [name], PharmD Clinical Pharmacist [place] [phone]

## 2018-09-28 NOTE — Patient Outreach (Signed)
[place] [facility]) Care Management  09/28/2018  Roger Rodriguez 03/28/1941 [id number]   Recent Admission to [facility] on 11/27-11/29 , for planned Revision of right total knee. Discharged home from [facility] on 1/9.  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 Diabetes   Unsuccessful outreach call to patient on today, no answer able to leave a HIPAA compliant message for return call.   Plan Will in basket , Trish Fountain, RN, Chronic care case manager that plans to outreach patient for services at embedded PCP practice to  notify of unsuccessful outreach to patient to discuss plan.   Will plan case closure after she makes contact.   [name], RN, [place] Management Coordinator  [phone]- Mobile [phone]- Toll Free Main Office

## 2018-09-28 NOTE — Patient Instructions (Signed)
Roger Rodriguez was given information about Chronic Care Management services today including:  1. CCM service includes personalized support from designated clinical staff supervised by his physician, including individualized plan of care and coordination with other care providers 2. 24/7 contact phone numbers for assistance for urgent and routine care needs. 3. Service will only be billed when office clinical staff spend 20 minutes or more in a month to coordinate care. 4. Only one practitioner may furnish and bill the service in a calendar month. 5. The patient may stop CCM services at any time (effective at the end of the month) by phone call to the office staff. 6. The patient will be responsible for cost sharing (co-pay) of up to 20% of the service fee (after annual deductible is met).  Patient agreed to services and verbal consent obtained.   Goals Addressed            This Visit's Progress   . I think my [place] ran out (pt-stated)       Current Barriers:  . Diminished manual dexterity . Financial Barriers  Pharmacist Clinical Goal(s):  [name] Over the next 10 days, patient will work with [place] to address needs related to [place] and [employer]  Interventions: . [facility] with [facility]   . Collaboration with [employer]   Patient Self Care Activities:  . Calls pharmacy for medication refills- making sure that they are billing his grant through the foundation  Initial goal documentation        The patient verbalized understanding of instructions provided today and declined a print copy of patient instruction materials.

## 2018-10-03 ENCOUNTER — Telehealth: Payer: Self-pay

## 2018-10-04 ENCOUNTER — Telehealth: Payer: Self-pay

## 2018-10-05 ENCOUNTER — Ambulatory Visit (INDEPENDENT_AMBULATORY_CARE_PROVIDER_SITE_OTHER): Payer: PPO

## 2018-10-05 ENCOUNTER — Ambulatory Visit: Payer: Self-pay | Admitting: Pharmacist

## 2018-10-05 ENCOUNTER — Other Ambulatory Visit: Payer: Self-pay

## 2018-10-05 DIAGNOSIS — E119 Type 2 diabetes mellitus without complications: Secondary | ICD-10-CM

## 2018-10-05 DIAGNOSIS — G20 Parkinson's disease: Secondary | ICD-10-CM

## 2018-10-05 DIAGNOSIS — I10 Essential (primary) hypertension: Secondary | ICD-10-CM

## 2018-10-05 DIAGNOSIS — G250 Essential tremor: Secondary | ICD-10-CM

## 2018-10-05 NOTE — Chronic Care Management (AMB) (Signed)
  Chronic Care Management   Note  10/05/2018 Name: Roger Rodriguez MRN: [id number] DOB: [birth year]  Care Coordination: Successful telephone outreach to [name], 78 year old male patient of Dr. Miguel Aschoff. [name] was referred to CCM RN CM by Jackelyn Poling, [facility] RN CM. Patient is currently work with [name] on medication management and affordability.  [name] admits to need for additional education and engagement with RN CM. He agrees to follow up telephone appointment on Monday 10/10/2018.  [name] has previously been provided education related to diabetes and is currently check his blood sugar daily. He reports today fasting blood sugar as 138 and 2 week average of <150. He is also compliant with medication adherence.    Telephone follow up appointment with CCM team member scheduled for: Monday 10/10/2018 at 10:00 with CCM RN CM  Chelse Matas E. Rollene Rotunda, RN, BSN Nurse Care Coordinator [facility] Practice/[facility] Care Management [phone]

## 2018-10-06 ENCOUNTER — Other Ambulatory Visit: Payer: Self-pay | Admitting: *Deleted

## 2018-10-06 NOTE — Patient Outreach (Signed)
[name] [facility]) Care Management  10/06/2018  Roger Rodriguez 1940/10/01 [id number]   Case Closure    Patient has transitioned to chronic care management by [facility] case manager at embedded  PCP office.   Plan  Will close [name] complex care management : patient enrolled in chronic care management program.  Will send patient and PCP case closure letters.    [name], RN, [place] Management Coordinator  [phone]- Mobile [phone]- Toll Free Main Office

## 2018-10-10 ENCOUNTER — Ambulatory Visit: Payer: Self-pay

## 2018-10-10 ENCOUNTER — Other Ambulatory Visit: Payer: Self-pay

## 2018-10-10 DIAGNOSIS — G20 Parkinson's disease: Secondary | ICD-10-CM

## 2018-10-10 DIAGNOSIS — I10 Essential (primary) hypertension: Secondary | ICD-10-CM | POA: Diagnosis not present

## 2018-10-10 DIAGNOSIS — E119 Type 2 diabetes mellitus without complications: Secondary | ICD-10-CM | POA: Diagnosis not present

## 2018-10-10 NOTE — Chronic Care Management (AMB) (Signed)
Chronic Care Management   Initial Visit Note  10/10/2018 Name: Roger Rodriguez MRN: [id number] DOB: July 20, 1940  Subjective: "I have been trying to manage my sugar by taking my medicine and watching what I eat"  Objective:  Lab Results  Component Value Date   HGBA1C 7.3 (H) 06/02/2018    Assessment:  Roger Rodriguez, 78 year old male patient of Dr. Miguel Aschoff. Roger Rodriguez was referred to CCM RN CM by Jackelyn Poling, [facility] RN CM. Patient is currently work with [facility] Pharmacist on medication management and affordability. Roger Rodriguez has a history of but not limited to HTN, Chronic venous insufficiency, Idiopathic Parkinson's, prostate CA, Hyperlipidemia, and newly diagnoses with DM. Today CCM RN CM completed initial assessment and education related to DM self care and assessed for COVID-19 educational opportunities.  Roger Rodriguez has previously been provided education related to diabetes and is currently check his blood sugar daily. He is unable to check his sugars independently however has 24 hour caregivers that assist with his care including daily BP and fasting glucometer checks. He is eating at least 3 meals daily and monitoring his carbohydrate intake. Reports today's fasting glucose as 137.He is managing his portions and trying to exercise his upper body as much as possible. He is currently bed/wheelchair bound and does not ambulate. He is able to stand and pivot with assist. He denies needs for additional assistance or community resources.  1 hospital admission in 6 months for knee replacement  Goals Addressed            This Visit's Progress   . I was recently diagnoses with Diabetes and I want to make sure I am doing what I should to get my sugars down (pt-stated)       Current Barriers:  [name] Knowledge Deficits related to basic Diabetes pathophysiology and self care/management . Inability to self care secondary to Parkinsons  Nurse Case Manager Clinical Goal(s):   [name] Over the next 14 days, patient will demonstrate improved adherence to prescribed treatment plan for diabetes self care/management as evidenced by checking blood glucose levels as prescribed, adhering to ADA/low carb diet, and medication adherence . Over the next 90 days, patient will have a reduction in A1C as evidenced by chart review and patient verbalization  Interventions:   . Provided education to patient re: diabetes . Reviewed medications with patient and discussed importance of medication adherence . Discussed plans with patient for ongoing care management follow up and provided patient with direct contact information for care management team . Provided patient with written educational materials related to hypo and hyperglycemia and importance of correct treatment . Reviewed scheduled/upcoming provider appointments including: patient has no follow up appointments at this time r/t COVID-19 stay at home order . Advised patient, providing education and rationale, to continue to  check cbg daily and record, calling Dr. Rosanna Randy for findings outside established parameters.   . Provided emotional support and reassurance related to current pandemic of COVID-19 . Discussed current CDC recommendations for social distancing and hand hygiene.   Patient Self Care Activities:  . Self administers medications as prescribed . Attends all scheduled provider appointments . Checks blood sugars as prescribed and utilize hyper and hypoglycemia protocol as needed . Adhere to ADA diet as discussed . Patient/Caregivers will follow CDC guidelines for COVID-19 infection prevention  Plan:  . RNCM will follow up with patient in 2 weeks   Initial goal documentation       Follow  up plan:  Telephone follow up appointment with CCM team member scheduled for:2 weeks   Roger Rodriguez was given information about Chronic Care Management services today including:  1. CCM service includes personalized support from  designated clinical staff supervised by his physician, including individualized plan of care and coordination with other care providers 2. 24/7 contact phone numbers for assistance for urgent and routine care needs. 3. Service will only be billed when office clinical staff spend 20 minutes or more in a month to coordinate care. 4. Only one practitioner may furnish and bill the service in a calendar month. 5. The patient may stop CCM services at any time (effective at the end of the month) by phone call to the office staff. 6. The patient will be responsible for cost sharing (co-pay) of up to 20% of the service fee (after annual deductible is met).  Patient agreed to services and verbal consent obtained.  Sola Margolis E. Rollene Rotunda, RN, BSN Nurse Care Coordinator [facility] Practice/[facility] Care Management [phone]

## 2018-10-10 NOTE — Patient Instructions (Signed)
Thank you allowing the Chronic Care Management Team to be a part of your care! It was a pleasure speaking with you today!  1. Continue to have your blood sugar checked daily and record 2. Continue to review and adhere to DM education previously provided by Jackelyn Poling, [facility] RN CM 3. Take all medications as prescribed  4. Follow ADA diet as instructed 5. Please "exercise" your upper body as often as you can. Any movement that elevates our heart rates is a plus 6. Please follow CDC guidelines for COVID-19 infection prevention  Handwashing is one of the best ways to protect yourself and your family from getting sick. Wash Your Hands Often to Stay Healthy  When: You can help yourself and your loved ones stay healthy by washing your hands often, especially during these key times when you are likely to get and spread germs: . Before, during, and after preparing food  . Before eating food  . Before and after caring for someone at home who is sick with vomiting or diarrhea  . Before and after treating a cut or wound  . After using the toilet  . After being in public spaces . After changing diapers or cleaning up a child who has used the toilet  . After blowing your nose, coughing, or sneezing  . After touching an animal, animal feed, or animal waste  . After handling pet food or pet treats  . After touching garbage . After touching public doors, telephones, shopping carts, or other surfaces .  Five Steps to [place] the Right Way 1. Wet your hands with clean, running water (warm or cold), turn off the tap, and apply soap. 2. Lather your hands by rubbing them together with the soap. Lather the backs of your hands, between your fingers, and under your nails. 3. Scrub your hands for at least 20 seconds. Need a timer? Hum the "Happy Birthday" song from beginning to end twice. 4. Rinse your hands well under clean, running water. 5. Dry your hands using a clean towel or air dry them.   Source: BridgeDigest.is   For information about COVID-19 or "Corona Virus", the following web resources may be helpful:  CDC: BeginnerSteps.be    [place]:  InsuranceIntern.se   CCM (Chronic Care Management) Team   Trish Fountain RN, BSN Nurse Care Coordinator  [phone]  [name] PharmD  Clinical Pharmacist  [phone]   [place], [place] Social Worker [phone]  Goals Addressed            This Visit's Progress   . I was recently diagnoses with Diabetes and I want to make sure I am doing what I should to get my sugars down (pt-stated)       Current Barriers:  [name] Knowledge Deficits related to basic Diabetes pathophysiology and self care/management . Inability to self care secondary to Parkinsons  Nurse Case Manager Clinical Goal(s):  [name] Over the next 14 days, patient will demonstrate improved adherence to prescribed treatment plan for diabetes self care/management as evidenced by checking blood glucose levels as prescribed, adhering to ADA/low carb diet, and medication adherence . Over the next 90 days, patient will have a reduction in A1C as evidenced by chart review and patient verbalization  Interventions:   . Provided education to patient re: diabetes . Reviewed medications with patient and discussed importance of medication adherence . Discussed plans with patient for ongoing care management follow up and provided patient with direct  contact information for care management team . Provided patient with written educational materials related to hypo and hyperglycemia and importance of correct treatment . Reviewed scheduled/upcoming provider appointments including: patient has no follow up appointments at this time r/t COVID-19 stay at home order . Advised patient,  providing education and rationale, to continue to  check cbg daily and record, calling Dr. Rosanna Randy for findings outside established parameters.   . Provided emotional support and reassurance related to current pandemic of COVID-19 . Discussed current CDC recommendations for social distancing and hand hygiene.   Patient Self Care Activities:  . Self administers medications as prescribed . Attends all scheduled provider appointments . Checks blood sugars as prescribed and utilize hyper and hypoglycemia protocol as needed . Adhere to ADA diet as discussed . Patient/Caregivers will follow CDC guidelines for COVID-19 infection prevention  Plan:  . RNCM will follow up with patient in 2 weeks   Initial goal documentation        Print copy of patient instructions provided.   Telephone follow up appointment with CCM team member scheduled for: 2 weeks   Type 2 Diabetes Mellitus, Self Care, Adult When you have type 2 diabetes (type 2 diabetes mellitus), you must make sure your blood sugar (glucose) stays in a healthy range. You can do this with:  Nutrition.  Exercise.  Lifestyle changes.  Medicines or insulin, if needed.  Support from your doctors and others. How to stay aware of blood sugar   Check your blood sugar level every day, as often as told.  Have your A1c (hemoglobin A1c) level checked two or more times a year. Have it checked more often if your doctor tells you to. Your doctor will set personal treatment goals for you. Generally, you should have these blood sugar levels:  Before meals (preprandial): 80-130 mg/dL (4.4-7.2 mmol/L).  After meals (postprandial): below 180 mg/dL (10 mmol/L).  A1c level: less than 7%. How to manage high and low blood sugar Signs of high blood sugar High blood sugar is called hyperglycemia. Know the signs of high blood sugar. Signs may include:  Feeling: ? Thirsty. ? Hungry. ? Very tired.  Needing to pee (urinate) more than usual.   Blurry vision. Signs of low blood sugar Low blood sugar is called hypoglycemia. This is when blood sugar is at or below 70 mg/dL (3.9 mmol/L). Signs may include:  Feeling: ? Hungry. ? Worried or nervous (anxious). ? Sweaty and clammy. ? Confused. ? Dizzy. ? Sleepy. ? Sick to your stomach (nauseous).  Having: ? A fast heartbeat. ? A headache. ? A change in your vision. ? Jerky movements that you cannot control (seizure). ? Tingling or no feeling (numbness) around your mouth, lips, or tongue.  Having trouble with: ? Moving (coordination). ? Sleeping. ? Passing out (fainting). ? Getting upset easily (irritability). Treating low blood sugar To treat low blood sugar, eat or drink something sugary right away. If you can think clearly and swallow safely, follow the 15:15 rule:  Take 15 grams of a fast-acting carb (carbohydrate). Talk with your doctor about how much you should take.  Some fast-acting carbs are: ? Sugar tablets (glucose pills). Take 3-4 pills. ? 6-8 pieces of hard candy. ? 4-6 oz (120-150 mL) of fruit juice. ? 4-6 oz (120-150 mL) of regular (not diet) soda. ? 1 Tbsp (15 mL) honey or sugar.  Check your blood sugar 15 minutes after you take the carb.  If your blood sugar is still at or below  70 mg/dL (3.9 mmol/L), take 15 grams of a carb again.  If your blood sugar does not go above 70 mg/dL (3.9 mmol/L) after 3 tries, get help right away.  After your blood sugar goes back to normal, eat a meal or a snack within 1 hour. Treating very low blood sugar If your blood sugar is at or below 54 mg/dL (3 mmol/L), you have very low blood sugar (severe hypoglycemia). This is an emergency. Do not wait to see if the symptoms will go away. Get medical help right away. Call your local emergency services (911 in the [place] If you have very low blood sugar and you cannot eat or drink, you may need a glucagon shot (injection). A family member or friend should learn how to check  your blood sugar and how to give you a glucagon shot. Ask your doctor if you need to have a glucagon shot kit at home. Follow these instructions at home: Medicine  Take insulin and diabetes medicines as told.  If your doctor says you should take more or less insulin and medicines, do this exactly as told.  Do not run out of insulin or medicines. Having diabetes can raise your risk for other long-term conditions. These include heart disease and kidney disease. Your doctor may prescribe medicines to help you not have these problems. Food   Make healthy food choices. These include: ? Chicken, fish, egg whites, and beans. ? Oats, whole wheat, bulgur, brown rice, quinoa, and millet. ? Fresh fruits and vegetables. ? Low-fat dairy products. ? Nuts, avocado, olive oil, and canola oil.  Meet with a food specialist (dietitian). He or she can help you make an eating plan that is right for you.  Follow instructions from your doctor about what you cannot eat or drink.  Drink enough fluid to keep your pee (urine) pale yellow.  Keep track of carbs that you eat. Do this by reading food labels and learning food serving sizes.  Follow your sick day plan when you cannot eat or drink normally. Make this plan with your doctor so it is ready to use. Activity  Exercise 3 or more times a week. (even if it is upper body)  Do not go more than 2 days without exercising.  Talk with your doctor before you start a new exercise. Your doctor may need to tell you to change: ? How much insulin or medicines you take. ? How much food you eat. Lifestyle  Do not use any tobacco products. These include cigarettes, chewing tobacco, and e-cigarettes. If you need help quitting, ask your doctor.  Ask your doctor how much alcohol is safe for you.  Learn to deal with stress. If you need help with this, ask your doctor. Body care   Stay up to date with your shots (immunizations).  Have your eyes and feet checked  by a doctor as often as told.  Check your skin and feet every day. Check for cuts, bruises, redness, blisters, or sores.  Brush your teeth and gums two times a day. Floss one or more times a day.  Go to the dentist one or more times every 6 months.  Stay at a healthy weight. General instructions  Take over-the-counter and prescription medicines only as told by your doctor.  Share your diabetes care plan with: ? Your work or school. ? People you live with.  Carry a card or wear jewelry that says you have diabetes.  Keep all follow-up visits as told by  your doctor. This is important. Questions to ask your doctor  Do I need to meet with a diabetes educator?  Where can I find a support group for people with diabetes? Where to find more information To learn more about diabetes, visit:  American Diabetes Association: www.diabetes.org  American Association of Diabetes Educators: www.diabeteseducator.org Summary  When you have type 2 diabetes, you must make sure your blood sugar (glucose) stays in a healthy range.  Check your blood sugar every day, as often as told.  Having diabetes can raise your risk for other conditions. Your doctor may prescribe medicines to help you not have these problems.  Keep all follow-up visits as told by your doctor. This is important. This information is not intended to replace advice given to you by your health care provider. Make sure you discuss any questions you have with your health care provider. Document Released: 10/21/2015 Document Revised: 12/20/2017 Document Reviewed: 08/02/2015 Elsevier Interactive Patient Education  2019 [employer].

## 2018-10-14 ENCOUNTER — Ambulatory Visit: Payer: Self-pay | Admitting: Pharmacist

## 2018-10-14 DIAGNOSIS — G20 Parkinson's disease: Secondary | ICD-10-CM

## 2018-10-14 NOTE — Chronic Care Management (AMB) (Signed)
  Chronic Care Management   Follow Up Note   10/14/2018 Name: Roger Rodriguez MRN: [id number] DOB: [birth year]  Referred by: [name]., MD Rodriguez for referral : Chronic Care Management (medication management)   Roger Rodriguez is a 78 y.o. year old male who is a primary care patient of [name]., MD. Clinical pharmacist outreach today to follow up on [name]. HIPAA identifiers verified.   Review of patient status, including review of consultants reports, relevant laboratory and other test results, and collaboration with appropriate care team members and the patient's provider was performed as part of comprehensive patient evaluation and provision of chronic care management services.    #Medication assistance: [place] grant has been reinstated. Roger Rodriguez was able to pick up medication but will need to fill out reimbursement form through [place]. We will be able to reapply to grant before it runs out in July, so he does not need to "conserve" grant for when he reaches the donut hole for Medicare coverage.   Goals Addressed            This Visit's Progress   . I think my [name] ran out (pt-stated)       Current Barriers:  . Diminished manual dexterity . Financial Barriers  Pharmacist Clinical Goal(s):  [name] Over the next 10 days, patient will work with [name] to address needs related to [name]   Completed 10/14/18: Patient's grant was reactivated, he has $3000 left. CCM pharmacist to send him a reimbursement form from [name] for March fill. Will plan to reapply when grant runs out  Interventions: . [facility]   . Collaboration with [name]   Patient Self Care Activities:  . Calls pharmacy for medication refills- making sure that they are billing his grant through the foundation  Please see past updates related to this goal by clicking on the "Past Updates" button in the  selected goal          The patient has been provided with contact information for the chronic care management team and has been advised to call with any health related questions or concerns.    Patient will contact CCM clinical pharmacist when grant has less than $400 left to reapply to the foundation.    Roger Rodriguez, PharmD Clinical Pharmacist [name] [phone]

## 2018-10-14 NOTE — Patient Instructions (Signed)
Goals    . DIET - REDUCE PORTION SIZE     Recommend monitoring portion sizes and decreasing portions by half and continuing to eat healthy snacks in between.     . I was recently diagnoses with Diabetes and I want to make sure I am doing what I should to get my sugars down (pt-stated)     Current Barriers:  [name] Knowledge Deficits related to basic Diabetes pathophysiology and self care/management . Inability to self care secondary to Parkinsons  Nurse Case Manager Clinical Goal(s):  [name] Over the next 14 days, patient will demonstrate improved adherence to prescribed treatment plan for diabetes self care/management as evidenced by checking blood glucose levels as prescribed, adhering to ADA/low carb diet, and medication adherence . Over the next 90 days, patient will have a reduction in A1C as evidenced by chart review and patient verbalization  Interventions:   . Provided education to patient re: diabetes . Reviewed medications with patient and discussed importance of medication adherence . Discussed plans with patient for ongoing care management follow up and provided patient with direct contact information for care management team . Provided patient with written educational materials related to hypo and hyperglycemia and importance of correct treatment . Reviewed scheduled/upcoming provider appointments including: patient has no follow up appointments at this time r/t COVID-19 stay at home order . Advised patient, providing education and rationale, to continue to  check cbg daily and record, calling Dr. Rosanna Randy for findings outside established parameters.   . Provided emotional support and reassurance related to current pandemic of COVID-19 . Discussed current CDC recommendations for social distancing and hand hygiene.   Patient Self Care Activities:  . Self administers medications as prescribed . Attends all scheduled provider appointments . Checks blood sugars as prescribed and utilize  hyper and hypoglycemia protocol as needed . Adhere to ADA diet as discussed . Patient/Caregivers will follow CDC guidelines for COVID-19 infection prevention  Plan:  . RNCM will follow up with patient in 2 weeks   Initial goal documentation     . Increase water intake     Recommend increasing water intake to 4-5 glasses a day.        The patient verbalized understanding of instructions provided today and declined a print copy of patient instruction materials.

## 2018-10-14 NOTE — Patient Instructions (Signed)
Goals Addressed            This Visit's Progress   . I think my [place] ran out (pt-stated)       Current Barriers:  . Diminished manual dexterity . Financial Barriers  Pharmacist Clinical Goal(s):  [name] Over the next 10 days, patient will work with [place] to address needs related to [place] and [employer]   Completed 10/14/18: Patient's grant was reactivated, he has $3000 left. CCM pharmacist to send him a reimbursement form from [place] for March fill. Will plan to reapply when grant runs out  Interventions: . [facility]   . Collaboration with [employer]   Patient Self Care Activities:  . Calls pharmacy for medication refills- making sure that they are billing his grant through the foundation  Please see past updates related to this goal by clicking on the "Past Updates" button in the selected goal        The patient verbalized understanding of instructions provided today and declined a print copy of patient instruction materials.

## 2018-10-14 NOTE — Chronic Care Management (AMB) (Signed)
  Chronic Care Management   Note 10/05/18 Name: Roger Rodriguez MRN: [id number] DOB: [birth year]  Incoming call from [place] regarding patient's grant for [place]. Reactivation request has been granted, accounts and documentation have now been corrected.   Patient already filled [employer] prescription for March. Will need to complete [facility] or [facility].   Goals    . DIET - REDUCE PORTION SIZE     Recommend monitoring portion sizes and decreasing portions by half and continuing to eat healthy snacks in between.     . I was recently diagnoses with Diabetes and I want to make sure I am doing what I should to get my sugars down (pt-stated)     Current Barriers:  [name] Knowledge Deficits related to basic Diabetes pathophysiology and self care/management . Inability to self care secondary to Parkinsons  Nurse Case Manager Clinical Goal(s):  [name] Over the next 14 days, patient will demonstrate improved adherence to prescribed treatment plan for diabetes self care/management as evidenced by checking blood glucose levels as prescribed, adhering to ADA/low carb diet, and medication adherence . Over the next 90 days, patient will have a reduction in A1C as evidenced by chart review and patient verbalization  Interventions:   . Provided education to patient re: diabetes . Reviewed medications with patient and discussed importance of medication adherence . Discussed plans with patient for ongoing care management follow up and provided patient with direct contact information for care management team . Provided patient with written educational materials related to hypo and hyperglycemia and importance of correct treatment . Reviewed scheduled/upcoming provider appointments including: patient has no follow up appointments at this time r/t COVID-19 stay at home order . Advised patient, providing education and rationale, to continue to  check cbg daily and record, calling  Dr. Rosanna Randy for findings outside established parameters.   . Provided emotional support and reassurance related to current pandemic of COVID-19 . Discussed current CDC recommendations for social distancing and hand hygiene.   Patient Self Care Activities:  . Self administers medications as prescribed . Attends all scheduled provider appointments . Checks blood sugars as prescribed and utilize hyper and hypoglycemia protocol as needed . Adhere to ADA diet as discussed . Patient/Caregivers will follow CDC guidelines for COVID-19 infection prevention  Plan:  . RNCM will follow up with patient in 2 weeks   Initial goal documentation     . Increase water intake     Recommend increasing water intake to 4-5 glasses a day.         Follow up plan: The CM team will reach out to the patient again over the next 14 days.   [name], PharmD Clinical Pharmacist [place] [phone]

## 2018-10-18 ENCOUNTER — Telehealth: Payer: Self-pay | Admitting: Family Medicine

## 2018-10-18 DIAGNOSIS — M6281 Muscle weakness (generalized): Secondary | ICD-10-CM | POA: Diagnosis not present

## 2018-10-18 DIAGNOSIS — M9711XS Periprosthetic fracture around internal prosthetic right knee joint, sequela: Secondary | ICD-10-CM | POA: Diagnosis not present

## 2018-10-18 DIAGNOSIS — T8453XD Infection and inflammatory reaction due to internal right knee prosthesis, subsequent encounter: Secondary | ICD-10-CM | POA: Diagnosis not present

## 2018-10-18 DIAGNOSIS — R262 Difficulty in walking, not elsewhere classified: Secondary | ICD-10-CM | POA: Diagnosis not present

## 2018-10-18 DIAGNOSIS — G20 Parkinson's disease: Secondary | ICD-10-CM | POA: Diagnosis not present

## 2018-10-18 NOTE — Telephone Encounter (Signed)
Pt called back. ° °teri °

## 2018-10-18 NOTE — Telephone Encounter (Signed)
Pt thinks he has a UTI but he does not want to come in.    Cal back 3 [phone]  Thanks teri

## 2018-10-18 NOTE — Telephone Encounter (Signed)
[id number] 

## 2018-10-19 ENCOUNTER — Other Ambulatory Visit: Payer: Self-pay | Admitting: Family Medicine

## 2018-10-19 ENCOUNTER — Telehealth: Payer: Self-pay

## 2018-10-19 DIAGNOSIS — R300 Dysuria: Secondary | ICD-10-CM

## 2018-10-19 MED ORDER — CIPROFLOXACIN HCL 500 MG PO TABS: 500.0000 mg | ORAL_TABLET | Freq: Two times a day (BID) | ORAL | 0 refills | Status: DC

## 2018-10-19 NOTE — Telephone Encounter (Signed)
Pt called c/o burning with urination and a deep yellow color to urine with odor, a headache last few days.  Symptoms going on for 1 week now.  Spoke with [name] and he advised to send in cipro 500 mg twice a day for 7 days.  [facility] [address]  dbs

## 2018-10-19 NOTE — Progress Notes (Signed)
cipro

## 2018-10-19 NOTE — Telephone Encounter (Signed)
Spoke to pt and he has been having burning with urination, deep yellow color to urine with odor and a slight headache for last few days.  Pt advised he has been having symptoms for a week now  Pt doesn't want to come in office if possible and would [facility] [address]  Would you like to call something in or does pt need to come in?  dbs

## 2018-10-24 ENCOUNTER — Other Ambulatory Visit: Payer: Self-pay

## 2018-10-24 ENCOUNTER — Ambulatory Visit: Payer: PPO

## 2018-10-24 DIAGNOSIS — G20 Parkinson's disease: Secondary | ICD-10-CM

## 2018-10-24 DIAGNOSIS — E119 Type 2 diabetes mellitus without complications: Secondary | ICD-10-CM

## 2018-10-24 DIAGNOSIS — I10 Essential (primary) hypertension: Secondary | ICD-10-CM

## 2018-10-24 NOTE — Chronic Care Management (AMB) (Signed)
Chronic Care Management   Follow Up Note   10/24/2018 Name: ATHONY COPPA MRN: [id number] DOB: Nov 25, 1940  Referred by: [name]., MD Reason for referral : Chronic Care Management (follow up DM)   Subjective: "My sugar was up a little this morning but it might be from the small piece of pizza I ate last night" "I thing the medicine for my UTI is working"   Objective:  Lab Results  Component Value Date   HGBA1C 7.3 (H) 06/02/2018    Assessment: Mr. Eluzer Howdeshell, 78 year old male patient of Dr. Miguel Aschoff. Mr. Heffern was referred to CCM RN CM by Jackelyn Poling, [facility] Community RN CM. Patient is currently work with [facility] Pharmacist on medication management and affordability. Mr Messman has a history of but not limited to HTN, Chronic venous insufficiency, Idiopathic Parkinson's, prostate CA, Hyperlipidemia, and newly diagnoses with DM. CCM RN CM completed initial assessment and education related to DM self care on 10/10/2018 and assessed for COVID-19 educational opportunities.  Today, CCM RN CM followed up with Mr. Culp to assess progression towards goals and to discuss recent complaints of UTI symptoms reported to PCP.  Goals Addressed            This Visit's Progress   . I was recently diagnoses with Diabetes and I want to make sure I am doing what I should to get my sugars down (pt-stated)       Mr. Blades continues to be compliant with his diabetes treatment plan. He is assisted with his plan or care by 24 hour caregivers. He is taking his medication as prescribed and his caregivers are check his blood sugars daily. He reports his daily readings in the 130s however this morning his fasting blood sugar was 140. He feels this is related to the small slice of pizza he had for dinner. His goal is to have his fasting sugars in the 120s. He continues to monitor his carbohydrate intake.  Current Barriers:  [name] Knowledge Deficits related to basic Diabetes  pathophysiology and self care/management . Inability to self care secondary to Parkinsons  Nurse Case Manager Clinical Goal(s):  [name] Over the next 14 days, patient will demonstrate continued adherence to prescribed treatment plan for diabetes self care/management as evidenced by checking blood glucose levels as prescribed, adhering to ADA/low carb diet, and medication adherence . Over the next 90 days, patient will have a reduction in A1C as evidenced by chart review and patient verbalization  Interventions:  . Reviewed medications with patient and discussed importance of medication adherence . Discussed plans with patient for ongoing care management follow up and provided patient with direct contact information for care management team . Reviewed CBG log and provided education on how certain foods can increase blood sugars . Reviewed scheduled/upcoming provider appointments including: patient has no follow up appointments at this time r/t COVID-19 stay at home order . Reinforced importance of daily CBG checks and record, calling Dr. Rosanna Randy for findings outside established parameters.   . Provided emotional support and reassurance related to current pandemic of COVID-19 . Discussed current CDC recommendations for social distancing and hand hygiene.   Patient Self Care Activities:  . Self administers medications as prescribed . Attends all scheduled provider appointments . Checks blood sugars as prescribed and utilize hyper and hypoglycemia protocol as needed . Adhere to ADA diet as discussed . Patient/Caregivers will follow CDC guidelines for COVID-19 infection prevention  Plan:  . RNCM will  follow up with patient in 2 weeks   Please see past updates related to this goal by clicking on the "Past Updates" button in the selected goal      . My urine was really dark and it burned when I urinated but it is getting better (pt-stated)       Mr. Sadowski states he recently began having signs and  symptoms of a urinary track infection. He described burring upon urination and dark colored urine. He has had UTI symptoms in the past and aware of the symptoms. He has a history of Prostate CA with prostectomy. PCP prescribed Cipro 500mg  twice daily for 7 days, first dose was taken 10/19/2018. Mr. Sandler is compliant with his antibiotic and drinking water and diet cranberry juice. He described his uring as yellow, but not pale yellow. He is encouraged to increase his water intake until his urine is pale yellow. He has no history of Heart Failure.   Current Barriers:  [name] Knowledge Deficits related to UTI prevention  Nurse Case Manager Clinical Goal(s):  [name] Over the next 14 days, patient will not experience a hospitalization related to UTI . Over the next 14 days, patient will report ongoing or worsening UTI symptoms to providerj  Interventions:  . Evaluation of current treatment plan related to current UTI and patient's adherence to plan as established by provider. . Advised patient to call Dr. Rosanna Randy as needed with fever >100.4, pain upon urination, fowl smelling urine, dark urine . Provided education to patient re: UTI signs and symptoms  Patient Self Care Activities:  . Currently UNABLE TO independently perfomr ADLs and IALDs . Self administers medications as prescribed . Attends all scheduled provider appointments . Calls provider office for new concerns or questions  Initial goal documentation         Telephone follow up appointment with CCM team member scheduled for: 2 weeks   Kota Ciancio E. Rollene Rotunda, RN, BSN Nurse Care Coordinator [facility] Care Management [phone]

## 2018-10-24 NOTE — Patient Instructions (Signed)
Thank you allowing the Chronic Care Management Team to be a part of your care! It was a pleasure speaking with you today!  1. Continue to take your medications as prescribed including finishing your antibiotic CIPRO for your UTI 2. Monitor your symptoms and if they do not improve or worsen please call Dr. Sherrie Mustache office. This includes burning upon urination, darkening of the urine, temp >100.4, frequent urination 3. Continue to check and record your blood sugars daily 4. Continue to adhere to a diabetic, carb modified diet  CCM (Chronic Care Management) Team   Trish Fountain RN, BSN Nurse Care Coordinator  [phone]  [name] PharmD  Clinical Pharmacist  [phone]   Elliot Gurney, LCSW Clinical Social Worker [phone]  Goals Addressed            This Visit's Progress   . I was recently diagnoses with Diabetes and I want to make sure I am doing what I should to get my sugars down (pt-stated)       Current Barriers:  [name] Knowledge Deficits related to basic Diabetes pathophysiology and self care/management . Inability to self care secondary to Parkinsons  Nurse Case Manager Clinical Goal(s):  [name] Over the next 14 days, patient will demonstrate continued adherence to prescribed treatment plan for diabetes self care/management as evidenced by checking blood glucose levels as prescribed, adhering to ADA/low carb diet, and medication adherence . Over the next 90 days, patient will have a reduction in A1C as evidenced by chart review and patient verbalization  Interventions:  . Reviewed medications with patient and discussed importance of medication adherence . Discussed plans with patient for ongoing care management follow up and provided patient with direct contact information for care management team . Reviewed CBG log and provided education on how certain foods can increase blood sugars . Reviewed scheduled/upcoming provider appointments including: patient has  no follow up appointments at this time r/t COVID-19 stay at home order . Reinforced importance of daily CBG checks and record, calling Dr. Rosanna Randy for findings outside established parameters.   . Provided emotional support and reassurance related to current pandemic of COVID-19 . Discussed current CDC recommendations for social distancing and hand hygiene.   Patient Self Care Activities:  . Self administers medications as prescribed . Attends all scheduled provider appointments . Checks blood sugars as prescribed and utilize hyper and hypoglycemia protocol as needed . Adhere to ADA diet as discussed . Patient/Caregivers will follow CDC guidelines for COVID-19 infection prevention  Plan:  . RNCM will follow up with patient in 2 weeks   Please see past updates related to this goal by clicking on the "Past Updates" button in the selected goal      . My urine was really dark and it burned when I urinated but it is getting better (pt-stated)       Current Barriers:  [name] Knowledge Deficits related to UTI prevention  Nurse Case Manager Clinical Goal(s):  [name] Over the next 14 days, patient will not experience a hospitalization related to UTI . Over the next 14 days, patient will report ongoing or worsening UTI symptoms to providerj  Interventions:  . Evaluation of current treatment plan related to current UTI and patient's adherence to plan as established by provider. . Advised patient to call Dr. Rosanna Randy as needed with fever >100.4, pain upon urination, fowl smelling urine, dark urine . Provided education to patient re: UTI signs and symptoms  Patient Self Care Activities:  . Currently  UNABLE TO independently perfomr ADLs and IALDs . Self administers medications as prescribed . Attends all scheduled provider appointments . Calls provider office for new concerns or questions  Initial goal documentation        The patient verbalized understanding of instructions provided today and  declined a print copy of patient instruction materials.   Telephone follow up appointment with CCM team member scheduled for: 2 weeks

## 2018-10-27 ENCOUNTER — Telehealth: Payer: Self-pay | Admitting: Family Medicine

## 2018-10-27 NOTE — Telephone Encounter (Signed)
Pt needs a refill on  Metformin 500  [facility] [address]  [employer]

## 2018-10-28 ENCOUNTER — Ambulatory Visit: Payer: Self-pay | Admitting: Pharmacist

## 2018-10-28 DIAGNOSIS — G20 Parkinson's disease: Secondary | ICD-10-CM

## 2018-10-28 DIAGNOSIS — E119 Type 2 diabetes mellitus without complications: Secondary | ICD-10-CM

## 2018-10-28 NOTE — Telephone Encounter (Addendum)
[facility]. Left message notifying pt. Tried calling [facility] a few times to verify but was on hold to long each time.

## 2018-10-28 NOTE — Chronic Care Management (AMB) (Signed)
Chronic Care Management   Follow Up Note   10/31/2018 Name: Roger Rodriguez MRN: [id number] DOB: Apr 19, 1941  Referred by: [name]., MD Reason for referral : Chronic Care Management (Pharmacy follow up)   Roger Rodriguez is a 78 y.o. year old male who is a primary care patient of [name]., MD. The CCM team was consulted for assistance with chronic disease management and care coordination needs.    Review of patient status, including review of consultants reports, relevant laboratory and other test results, and collaboration with appropriate care team members and the patient's provider was performed as part of comprehensive patient evaluation and provision of chronic care management services.     Assessment: #DM: Patient states he was able to obtain refills of metformin  #Med assistance: patient received reimbursement form for [employer], has receipt from [place] transaction [facility]. Son to assist him in filling it out.   #COVID19: Practice proper social distancing, hand washing, infection prevention.  Goals Addressed            This Visit's Progress   . I was recently diagnoses with Diabetes and I want to make sure I am doing what I should to get my sugars down (pt-stated)       Current Barriers:  [name] Knowledge Deficits related to basic Diabetes pathophysiology and self care/management . Inability to self care secondary to Parkinsons  Nurse Case Manager Clinical Goal(s):  [name] Over the next 14 days, patient will demonstrate continued adherence to prescribed treatment plan for diabetes self care/management as evidenced by checking blood glucose levels as prescribed, adhering to ADA/low carb diet, and medication adherence . Over the next 90 days, patient will have a reduction in A1C as evidenced by chart review and patient verbalization  Interventions:  . Reviewed medications with patient and discussed importance of medication adherence . Discussed plans  with patient for ongoing care management follow up and provided patient with direct contact information for care management team . Reviewed CBG log and provided education on how certain foods can increase blood sugars . Reviewed scheduled/upcoming provider appointments including: patient has no follow up appointments at this time r/t COVID-19 stay at home order . Reinforced importance of daily CBG checks and record, calling Dr. Rosanna Randy for findings outside established parameters.   . Provided emotional support and reassurance related to current pandemic of COVID-19 . Discussed current CDC recommendations for social distancing and hand hygiene.   Patient Self Care Activities:  . Self administers medications as prescribed . Attends all scheduled provider appointments . Checks blood sugars as prescribed and utilize hyper and hypoglycemia protocol as needed . Adhere to ADA diet as discussed . Patient/Caregivers will follow CDC guidelines for COVID-19 infection prevention . Updated 10/28/18: refills metformin rx at pharmacy   Plan:  . RNCM will follow up with patient in 2 weeks   Please see past updates related to this goal by clicking on the "Past Updates" button in the selected goal      . I'm trying to not get this virus (pt-stated)       Current Barriers:  [name] Knowledge Deficits related to COVID-19 and impact on patient self health management  Clinical Goal(s):  [name] Over the next 30 days, patient will verbalize basic understanding of COVID-19 impact on individual health and self health management as evidenced by verbalization of basic understanding of COVID-19 as a viral disease, measures to prevent exposure, signs and symptoms, when to contact  provider  Interventions: . Advised patient, providing education on social distancing, infection prevention, and proper handwashing technique, and when to call provider or go to the ED  Patient Self Care Activities:   Practice social distancing,  maintaining six feet between all persons; proper handwashing; wear a mask if leaving the home; limit errands such as groceries   Initial goal documentation                        Telephone follow up appointment with CCM team member scheduled for: 4/29 with Dixon, PharmD Clinical Pharmacist [place] [phone]

## 2018-10-31 NOTE — Patient Instructions (Signed)
Goals Addressed            This Visit's Progress   . I was recently diagnoses with Diabetes and I want to make sure I am doing what I should to get my sugars down (pt-stated)       Current Barriers:  [name] Knowledge Deficits related to basic Diabetes pathophysiology and self care/management . Inability to self care secondary to Parkinsons  Nurse Case Manager Clinical Goal(s):  [name] Over the next 14 days, patient will demonstrate continued adherence to prescribed treatment plan for diabetes self care/management as evidenced by checking blood glucose levels as prescribed, adhering to ADA/low carb diet, and medication adherence . Over the next 90 days, patient will have a reduction in A1C as evidenced by chart review and patient verbalization  Interventions:  . Reviewed medications with patient and discussed importance of medication adherence . Discussed plans with patient for ongoing care management follow up and provided patient with direct contact information for care management team . Reviewed CBG log and provided education on how certain foods can increase blood sugars . Reviewed scheduled/upcoming provider appointments including: patient has no follow up appointments at this time r/t COVID-19 stay at home order . Reinforced importance of daily CBG checks and record, calling Dr. Rosanna Randy for findings outside established parameters.   . Provided emotional support and reassurance related to current pandemic of COVID-19 . Discussed current CDC recommendations for social distancing and hand hygiene.   Patient Self Care Activities:  . Self administers medications as prescribed . Attends all scheduled provider appointments . Checks blood sugars as prescribed and utilize hyper and hypoglycemia protocol as needed . Adhere to ADA diet as discussed . Patient/Caregivers will follow CDC guidelines for COVID-19 infection prevention . Updated 10/28/18: refills metformin rx at pharmacy   Plan:  . RNCM  will follow up with patient in 2 weeks   Please see past updates related to this goal by clicking on the "Past Updates" button in the selected goal      . I'm trying to not get this virus (pt-stated)       Current Barriers:  [name] Knowledge Deficits related to COVID-19 and impact on patient self health management  Clinical Goal(s):  [name] Over the next 30 days, patient will verbalize basic understanding of COVID-19 impact on individual health and self health management as evidenced by verbalization of basic understanding of COVID-19 as a viral disease, measures to prevent exposure, signs and symptoms, when to contact provider  Interventions: . Advised patient, providing education on social distancing, infection prevention, and proper handwashing technique, and when to call provider or go to the ED  Patient Self Care Activities:   Practice social distancing, maintaining six feet between all persons; proper handwashing; wear a mask if leaving the home; limit errands such as groceries   Initial goal documentation                       The patient verbalized understanding of instructions provided today and declined a print copy of patient instruction materials.

## 2018-11-09 ENCOUNTER — Ambulatory Visit (INDEPENDENT_AMBULATORY_CARE_PROVIDER_SITE_OTHER): Payer: PPO

## 2018-11-09 ENCOUNTER — Other Ambulatory Visit: Payer: Self-pay

## 2018-11-09 DIAGNOSIS — E119 Type 2 diabetes mellitus without complications: Secondary | ICD-10-CM

## 2018-11-09 DIAGNOSIS — I10 Essential (primary) hypertension: Secondary | ICD-10-CM | POA: Diagnosis not present

## 2018-11-09 DIAGNOSIS — R300 Dysuria: Secondary | ICD-10-CM

## 2018-11-09 DIAGNOSIS — N390 Urinary tract infection, site not specified: Secondary | ICD-10-CM

## 2018-11-09 NOTE — Chronic Care Management (AMB) (Addendum)
Chronic Care Management   Follow Up Note   11/09/2018 Name: Roger Rodriguez MRN: [id number] DOB: 1941/06/25  Referred by: Roger Rodriguez., MD Reason for referral : Chronic Care Management (follow up DM/UTI)   Subjective: "My UTI symptoms got better but now my urine is real dark and it has had a smell for two days"   Objective:  Lab Results  Component Value Date   HGBA1C 7.3 (H) 06/02/2018   Lab Results  Component Value Date   CREATININE 1.05 08/15/2018   BUN 17 08/15/2018   NA 135 08/15/2018   K 4.6 08/15/2018   CL 95 (L) 08/15/2018   CO2 24 08/15/2018     Assessment:  [name], 78 year old male patient of Dr. Miguel Rodriguez. [name] was referred to CCM RN Rodriguez by Roger Rodriguez. Patient is currently work with Roger Rodriguez on medication management and affordability. Mr Rodriguez has a history of but not limited to HTN, Chronic venous insufficiency, Idiopathic Parkinson's, prostate CA, Hyperlipidemia, and newly diagnoses with DM. CCM RN Rodriguez completed initial assessment and education related to DM self care on 10/10/2018 and assessed for COVID-19 educational opportunities.  Today, CCM RN Rodriguez followed up with Roger Rodriguez to assess progression towards goals and to discuss recent complaints of UTI symptoms reported to PCP.  Review of patient status, including review of consultants reports, relevant laboratory and other test results, and collaboration with appropriate care team members and the patient's provider was performed as part of comprehensive patient evaluation and provision of chronic care management services.    Goals Addressed            This Visit's Progress   . I was recently diagnoses with Diabetes and I want to make sure I am doing what I should to get my sugars down (pt-stated)       Roger Rodriguez continues to do well, with the assistance of his caregivers, to manage his diabetes very well. His caregivers are preparing meals  according to the ADA diet, he is taking all his medications as prescribed, and caregivers continue to check his fasting blood sugars each morning. Today he reports 130. Roger Rodriguez resports his fasting blood sugars range from 120s-140s each morning, the higher numbers after he has more carbohydrates with his evening meals.  Current Barriers:  [name] Knowledge Deficits related to basic Diabetes pathophysiology and self care/management . Inability to self care secondary to Parkinsons  Nurse Case Manager Clinical Goal(s):  [name] Over the next 14 days, patient will demonstrate continued adherence to prescribed treatment plan for diabetes self care/management as evidenced by checking blood glucose levels as prescribed, adhering to ADA/low carb diet, and medication adherence-goal met 11/09/2018 . Over the next 90 days, patient will have a reduction in A1C as evidenced by chart review and patient verbalization  Interventions:  .  Assessed for medication compliance . Reviewed CBG log and reinforced education on how certain foods can increase blood sugars . Reviewed scheduled/upcoming provider appointments including: patient has no follow up appointments at this time r/t COVID-19 stay at home order . Reinforced importance of daily CBG checks and record, calling Dr. Rosanna Rodriguez for findings outside established parameters.   . Provided emotional support and reassurance related to current pandemic of COVID-19 . Discussed current CDC recommendations for social distancing and hand hygiene.   Patient Self Care Activities:  . Self administers medications as prescribed . Attends all scheduled provider appointments .  Checks blood sugars as prescribed and utilize hyper and hypoglycemia protocol as needed . Adhere to ADA diet as discussed . Patient/Caregivers will follow CDC guidelines for COVID-19 infection prevention   Plan:  . RNCM will follow up with patient in 30 days   Please see past updates related to this goal by  clicking on the "Past Updates" button in the selected goal      . My urine was really dark and it burned when I urinated but it is getting better (pt-stated)       Roger Rodriguez states his UTI symptoms resolved with the completion of his antibiotic, however over the last 2 days his urine has returned to a darker color and has an odor. He admits to not drinking enough fluids as he was during UTI treatment. He denies any pain or burning with urination as before. He is encouraged to push fluids for next 24 hours but if symptoms do not improve with hydration, he is to make an appointment with Dr. Rosanna Rodriguez.   Current Barriers:  [name] Knowledge Deficits related to UTI prevention  Nurse Case Manager Clinical Goal(s):  [name] Over the next 14 days, patient will not experience a hospitalization related to UTI . Over the next 2 days, patient will report ongoing or worsening UTI symptoms to provider  Interventions:   Assessed for resolution of UTI symptoms  Discussed how dehydration and UTI can have overlapping symptoms  Encouraged patient to increase H2O until urine is pale yellow. May want to add 100% Cranberry Juice into diet as additional UTI prevention strategy. . Confirmed patient completed recent antibiotic therapy for suspected UTI . Advised patient to call Dr. Rosanna Rodriguez as needed with fever >100.4, pain upon urination, fowl smelling urine, dark urine . Provided education to patient re: UTI signs and symptoms  Patient Self Care Activities:  . Currently UNABLE TO independently perfomr ADLs and IALDs . Self administers medications as prescribed . Attends all scheduled provider appointments . Calls provider office for new concerns or questions  Please see past updates related to this goal by clicking on the "Past Updates" button in the selected goal       Telephone follow up appointment with CCM team member scheduled for:2 days   Roger Rodriguez E. Rollene Rotunda, RN, BSN Nurse Care Coordinator [facility]  Practice/[facility] Care Management [phone]

## 2018-11-10 NOTE — Patient Instructions (Signed)
Thank you allowing the Chronic Care Management Team to be a part of your care! It was a pleasure speaking with you today!  1. Please increase your water intake considerable over the next 24 hours. You can add 100% pure cranberry juice to your water or drink it by itself as an additional UTI prevention strategy. 2. If your symptoms worsen or do not improve with hydration, please call Dr. Marlan Palau office for a same day appointment.  What are the signs or symptoms? Symptoms of this condition include:  Needing to pee right away (urgently).  Peeing often.  Peeing small amounts often.  Pain or burning when peeing.  Blood in the pee.  Pee that smells bad or not like normal.  Trouble peeing.  Pee that is cloudy.  Pain in the belly or lower back.  3. You are doing great following your plan of care for managing your diabetes. Keep up the good work!  CCM (Chronic Care Management) Team   Trish Fountain RN, BSN Nurse Care Coordinator  [phone]  [name] PharmD  Clinical Pharmacist  [phone]   Elliot Gurney, LCSW Clinical Social Worker [phone]  Goals Addressed            This Visit's Progress   . I was recently diagnoses with Diabetes and I want to make sure I am doing what I should to get my sugars down (pt-stated)       Current Barriers:  [name] Knowledge Deficits related to basic Diabetes pathophysiology and self care/management . Inability to self care secondary to Parkinsons  Nurse Case Manager Clinical Goal(s):  [name] Over the next 14 days, patient will demonstrate continued adherence to prescribed treatment plan for diabetes self care/management as evidenced by checking blood glucose levels as prescribed, adhering to ADA/low carb diet, and medication adherence-goal met 11/09/2018 . Over the next 90 days, patient will have a reduction in A1C as evidenced by chart review and patient verbalization  Interventions:  .  Assessed for medication  compliance . Reviewed CBG log and reinforced education on how certain foods can increase blood sugars . Reviewed scheduled/upcoming provider appointments including: patient has no follow up appointments at this time r/t COVID-19 stay at home order . Reinforced importance of daily CBG checks and record, calling Dr. Rosanna Randy for findings outside established parameters.   . Provided emotional support and reassurance related to current pandemic of COVID-19 . Discussed current CDC recommendations for social distancing and hand hygiene.   Patient Self Care Activities:  . Self administers medications as prescribed . Attends all scheduled provider appointments . Checks blood sugars as prescribed and utilize hyper and hypoglycemia protocol as needed . Adhere to ADA diet as discussed . Patient/Caregivers will follow CDC guidelines for COVID-19 infection prevention   Plan:  . RNCM will follow up with patient in 30 days   Please see past updates related to this goal by clicking on the "Past Updates" button in the selected goal      . My urine was really dark and it burned when I urinated but it is getting better (pt-stated)       Current Barriers:  [name] Knowledge Deficits related to UTI prevention  Nurse Case Manager Clinical Goal(s):  [name] Over the next 14 days, patient will not experience a hospitalization related to UTI . Over the next 14 days, patient will report ongoing or worsening UTI symptoms to providerj  Interventions:   Assessed for resolution of UTI symptoms  Discussed how  dehydration and UTI can have overlapping symptoms  Encouraged patient to increase H2O until urine is pale yellow. May want to add 100% Cranberry Juice into diet as additional UTI prevention strategy. . Confirmed patient completed recent antibiotic therapy for suspected UTI . Advised patient to call Dr. Rosanna Randy as needed with fever >100.4, pain upon urination, fowl smelling urine, dark urine . Provided education to  patient re: UTI signs and symptoms  Patient Self Care Activities:  . Currently UNABLE TO independently perfomr ADLs and IALDs . Self administers medications as prescribed . Attends all scheduled provider appointments . Calls provider office for new concerns or questions  Please see past updates related to this goal by clicking on the "Past Updates" button in the selected goal         The patient verbalized understanding of instructions provided today and declined a print copy of patient instruction materials.   Telephone follow up appointment with CCM team member scheduled for: 2 days

## 2018-11-11 ENCOUNTER — Ambulatory Visit: Payer: Self-pay

## 2018-11-11 ENCOUNTER — Telehealth: Payer: Self-pay

## 2018-11-11 NOTE — Chronic Care Management (AMB) (Signed)
   Chronic Care Management   Unsuccessful Call Note 11/11/2018 Name: REICHEN HUTZLER MRN: [id number] DOB: Jul 31, 1940   Mr. [name], 78 year old male patient of Dr. Miguel Aschoff. Mr. Vanderzee was referred to CCM RN CM by Jackelyn Poling, [facility] Community RN CM. Patient is currently work with [facility] Pharmacist on medication management and affordability. Mr Agcaoili has a history of but not limited to HTN, Chronic venous insufficiency, Idiopathic Parkinson's, prostate CA, Hyperlipidemia, and newly diagnoses with DM. CCM RN CM completed initial assessment and education related to DM self careon 3/30/2020and assessed for COVID-19 educational opportunities.  Today, CCM RN CM followed up with Mr. Shell to discuss recent complaints of UTI symptoms returning.   Was unable to reach patient via telephone. I have left HIPAA compliant voicemail asking patient to return my call. (unsuccessful outreach #1).   Plan: Will follow-up within 7 business days via telephone.    Annamary Buschman E. Rollene Rotunda, RN, BSN Nurse Care Coordinator [facility] Care Management [phone]

## 2018-11-15 ENCOUNTER — Telehealth: Payer: Self-pay

## 2018-11-15 ENCOUNTER — Ambulatory Visit: Payer: Self-pay

## 2018-11-15 DIAGNOSIS — N390 Urinary tract infection, site not specified: Secondary | ICD-10-CM

## 2018-11-15 DIAGNOSIS — G20 Parkinson's disease: Secondary | ICD-10-CM

## 2018-11-15 DIAGNOSIS — E119 Type 2 diabetes mellitus without complications: Secondary | ICD-10-CM

## 2018-11-15 NOTE — Chronic Care Management (AMB) (Signed)
   Chronic Care Management   Unsuccessful Call Note 11/11/2018 Name: Roger Rodriguez         MRN: [id number]       DOB: 1940-08-07  Roger Rodriguez, 78 year old male patient of Dr. Miguel Aschoff. Roger Rodriguez was referred to CCM RN CM by Jackelyn Poling, [facility] Community RN CM. Patient is currently work with [facility] Pharmacist on medication management and affordability. Roger Rodriguez has a history of but not limited to HTN, Chronic venous insufficiency, Idiopathic Parkinson's, prostate CA, Hyperlipidemia, and newly diagnoses with DM. CCM RN CM completed initial assessment and education related to DM self careon 3/30/2020and assessed for COVID-19 educational opportunities.  Today, CCM RN CM followed up with Roger Rodriguez to discuss recent complaints of UTI symptoms returning.  Was unable to reach patient via telephone. Ihave left HIPAA compliant voicemail asking patient to return my call. (unsuccessful outreach #2).  Plan: Will follow-up within 7business days via telephone.   Jamayah Myszka E. Rollene Rotunda, RN, BSN Nurse Care Coordinator [facility] Care Management [phone]

## 2018-11-17 ENCOUNTER — Telehealth: Payer: Self-pay

## 2018-11-17 DIAGNOSIS — M9711XS Periprosthetic fracture around internal prosthetic right knee joint, sequela: Secondary | ICD-10-CM | POA: Diagnosis not present

## 2018-11-17 DIAGNOSIS — M6281 Muscle weakness (generalized): Secondary | ICD-10-CM | POA: Diagnosis not present

## 2018-11-17 DIAGNOSIS — R262 Difficulty in walking, not elsewhere classified: Secondary | ICD-10-CM | POA: Diagnosis not present

## 2018-11-17 DIAGNOSIS — T8453XD Infection and inflammatory reaction due to internal right knee prosthesis, subsequent encounter: Secondary | ICD-10-CM | POA: Diagnosis not present

## 2018-11-17 DIAGNOSIS — G20 Parkinson's disease: Secondary | ICD-10-CM | POA: Diagnosis not present

## 2018-11-22 ENCOUNTER — Ambulatory Visit (INDEPENDENT_AMBULATORY_CARE_PROVIDER_SITE_OTHER): Payer: PPO

## 2018-11-22 ENCOUNTER — Other Ambulatory Visit: Payer: Self-pay

## 2018-11-22 DIAGNOSIS — E119 Type 2 diabetes mellitus without complications: Secondary | ICD-10-CM

## 2018-11-22 DIAGNOSIS — N390 Urinary tract infection, site not specified: Secondary | ICD-10-CM

## 2018-11-22 DIAGNOSIS — E7800 Pure hypercholesterolemia, unspecified: Secondary | ICD-10-CM | POA: Diagnosis not present

## 2018-11-22 DIAGNOSIS — G20 Parkinson's disease: Secondary | ICD-10-CM

## 2018-11-22 NOTE — Chronic Care Management (AMB) (Signed)
Chronic Care Management   Follow Up Note   11/22/2018 Name: Roger Rodriguez MRN: [id number] DOB: 1940/10/17  Referred by: [name]., MD Reason for referral : Chronic Care Management (follow up on DM and UTI symptoms)   Subjective: "I am not having any burning with urination and my urine is a lot lighter and does not have an odor"   Objective:  Assessment: Roger Rodriguez, 78 year old male patient of Dr. Miguel Aschoff. Roger Rodriguez was referred to CCM RN CM by Jackelyn Poling, [facility] Community RN CM. Patient is currently work with [facility] Pharmacist on medication management and affordability. Roger Rodriguez has a history of but not limited to HTN, Chronic venous insufficiency, Idiopathic Parkinson's, prostate CA, Hyperlipidemia, and newly diagnoses with DM. CCM RN CM completed initial assessment and education related to DM self careon 3/30/2020and assessed for COVID-19 educational opportunities.  Today CCM RN CM followed up with patient for ongoing assessment of needs and progression towards health goals.  Review of patient status, including review of consultants reports, relevant laboratory and other test results, and collaboration with appropriate care team members and the patient's provider was performed as part of comprehensive patient evaluation and provision of chronic care management services.    Goals Addressed            This Visit's Progress    I was recently diagnoses with Diabetes and I want to make sure I am doing what I should to get my sugars down (pt-stated)       Roger Rodriguez continues to do well managing his diabetes with the assistance of his caregivers. He continues to have his sugars checked twice daily and reports this mornings reading as 147. He states this number is unusually high as his sugars usually run in the 120s-130s. He acknowledges having a more sugary snack after dinner which was a chocolate/carmel jello pudding cup. He continues to take all  his medications as prescribed daily as prepared by his caregivers. He is eager to see if his A1C has changed over the last couple of months. He currently does not have any appointments scheduled secondary to Covid-19 restrictions. Will make sure patient follows up with PCP at appropriate time.  Current Barriers:   Knowledge Deficits related to basic Diabetes pathophysiology and self care/management  Inability to self care secondary to Parkinsons  Nurse Case Manager Clinical Goal(s):   Over the next 14 days, patient will demonstrate continued adherence to prescribed treatment plan for diabetes self care/management as evidenced by checking blood glucose levels as prescribed, adhering to ADA/low carb diet, and medication adherence-goal met 11/09/2018  Over the next 90 days, patient will have a reduction in A1C as evidenced by chart review and patient verbalization  Interventions:    Assessed for medication compliance  Reviewed CBG log and reinforced education on how certain foods can increase blood sugars  Reviewed scheduled/upcoming provider appointments including: patient has no follow up appointments at this time r/t COVID-19 stay at home order  Reinforced importance of daily CBG checks and record, calling Dr. Rosanna Randy for findings outside established parameters.    Provided emotional support and reassurance related to current pandemic of COVID-19  Discussed current CDC recommendations for social distancing and hand hygiene.   Patient Self Care Activities:   Self administers medications as prescribed  Attends all scheduled provider appointments  Checks blood sugars as prescribed and utilize hyper and hypoglycemia protocol as needed  Adhere to ADA diet as discussed  Patient/Caregivers will follow CDC guidelines for COVID-19 infection prevention   Plan:   RNCM will follow up with patient in 30 days   Please see past updates related to this goal by clicking on the "Past  Updates" button in the selected goal       My urine was really dark and it burned when I urinated but it is getting better (pt-stated)       Roger Rodriguez states his UTI symptoms have resolved. He is drinking more water and 4 oz of no sugar added 100% cranberry juice daily. His urine has returned to a pale yellow, denies odor or burring upon urination. He is aware to notify PCP if symptoms return.  Current Barriers:   Knowledge Deficits related to UTI prevention  Nurse Case Manager Clinical Goal(s):   Over the next 60 days, patient will not experience a hospitalization related to UTI  Over the next 14 days, patient will report ongoing or worsening UTI symptoms to provider-goal met 11/22/2018  Interventions:   Assessed for resolution of UTI symptoms  Encouraged patient to increase H2O until urine is pale yellow. May want to add 100% Cranberry Juice into diet as additional UTI prevention strategy.  Advised patient to call Dr. Rosanna Randy as needed with fever >100.4, pain upon urination, fowl smelling urine, dark urine  Reinforced education to patient re: UTI signs and symptoms  Patient Self Care Activities:   Currently UNABLE TO independently perfomr ADLs and IALDs  Self administers medications as prescribed  Attends all scheduled provider appointments  Calls provider office for new concerns or questions  Please see past updates related to this goal by clicking on the "Past Updates" button in the selected goal         Telephone follow up appointment with CCM team member scheduled for: 30 days   Cale Bethard E. Rollene Rotunda, RN, BSN Nurse Care Coordinator [facility] Care Management [phone]

## 2018-11-22 NOTE — Chronic Care Management (AMB) (Signed)
  Chronic Care Management   Note  11/22/2018 Name: Roger Rodriguez MRN: [id number] DOB: [birth year]  Care Coordination: Successful telephone encounter to [name], primary care patient of Dr. Miguel Aschoff. CCM RN CM attempted to follow up with Roger Rodriguez to discuss diabetes, recent UTI symptoms and health goals. Roger Rodriguez states he is in the middle of a bath and request CCM RN CM to call back later this afternoon.  Plan: CCM RN CM will follow up with patient as requested above  Morna Flud E. Rollene Rotunda, RN, BSN Nurse Care Coordinator [facility] Care Management [phone]

## 2018-11-22 NOTE — Patient Instructions (Signed)
  Thank you allowing the Chronic Care Management Team to be a part of your care! It was a pleasure speaking with you today!  1. Please call your dentist ASAP to see if they are open for business. 2.   Please continue increase your water intake to remain hydrated. You can continue to add 100% pure cranberry juice to your water or drink it by itself as an additional UTI prevention strategy. 3.   If your symptoms worsen or do not improve with hydration, please call Dr. Marlan Palau office for a same day appointment. 4.   Continue to follow your diabetes plan of care including limiting your carbohydrate intake and taking medications as proscribed.  CCM (Chronic Care Management) Team   Trish Fountain RN, BSN Nurse Care Coordinator  [phone]  [name] PharmD  Clinical Pharmacist  [phone]   Elliot Gurney, LCSW Clinical Social Worker [phone]  Goals Addressed            This Visit's Progress   . My urine was really dark and it burned when I urinated but it is getting better (pt-stated)       Current Barriers:  [name] Knowledge Deficits related to UTI prevention  Nurse Case Manager Clinical Goal(s):  [name] Over the next 60 days, patient will not experience a hospitalization related to UTI . Over the next 14 days, patient will report ongoing or worsening UTI symptoms to provider-goal met 11/22/2018  Interventions:   Assessed for resolution of UTI symptoms  Encouraged patient to increase H2O until urine is pale yellow. May want to add 100% Cranberry Juice into diet as additional UTI prevention strategy. . Advised patient to call Dr. Rosanna Randy as needed with fever >100.4, pain upon urination, fowl smelling urine, dark urine . Reinforced education to patient re: UTI signs and symptoms  Patient Self Care Activities:  . Currently UNABLE TO independently perfomr ADLs and IALDs . Self administers medications as prescribed . Attends all scheduled provider appointments . Calls  provider office for new concerns or questions  Please see past updates related to this goal by clicking on the "Past Updates" button in the selected goal         The patient verbalized understanding of instructions provided today and declined a print copy of patient instruction materials.   Telephone follow up appointment with CCM team member scheduled for: 30 days The patient will call dentist as advised to schedule an appointment if open for crown replacement.

## 2018-12-18 DIAGNOSIS — M6281 Muscle weakness (generalized): Secondary | ICD-10-CM | POA: Diagnosis not present

## 2018-12-18 DIAGNOSIS — G20 Parkinson's disease: Secondary | ICD-10-CM | POA: Diagnosis not present

## 2018-12-18 DIAGNOSIS — M9711XS Periprosthetic fracture around internal prosthetic right knee joint, sequela: Secondary | ICD-10-CM | POA: Diagnosis not present

## 2018-12-18 DIAGNOSIS — T8453XD Infection and inflammatory reaction due to internal right knee prosthesis, subsequent encounter: Secondary | ICD-10-CM | POA: Diagnosis not present

## 2018-12-18 DIAGNOSIS — R262 Difficulty in walking, not elsewhere classified: Secondary | ICD-10-CM | POA: Diagnosis not present

## 2018-12-27 ENCOUNTER — Telehealth: Payer: Self-pay

## 2018-12-29 ENCOUNTER — Ambulatory Visit: Payer: Self-pay

## 2018-12-29 ENCOUNTER — Telehealth: Payer: Self-pay

## 2018-12-29 NOTE — Chronic Care Management (AMB) (Signed)
   Chronic Care Management   Unsuccessful Call Note 11/11/2018 Name:Roger Rodriguez: [id number] DOB: 1940/07/15  [name], 78 year old male patient of Dr. Miguel Aschoff. Mr. Levesque was referred to CCM RN CM by Jackelyn Poling, [facility] Community RN CM. Patient is currently work with [facility] Pharmacist on medication management and affordability. Mr Janney has a history of but not limited to HTN, Chronic venous insufficiency, Idiopathic Parkinson's, prostate CA, Hyperlipidemia, and newly diagnoses with DM. CCM RN CM completed initial assessment and education related to DM self careon 3/30/2020and assessed for COVID-19 educational opportunities.  Today, CCM RN CM attempted monthly telephone follow up to discuss chronic health issues.  Was unable to reach patient via telephone. Ihave left HIPAA compliant voicemail asking patient to return my call. (unsuccessful outreach #1).  Plan: Will follow-up within 7business days via telephone.  Niveah Boerner E. Rollene Rotunda, RN, BSN Nurse Care Coordinator [facility] Care Management [phone]

## 2019-01-03 ENCOUNTER — Ambulatory Visit: Payer: Self-pay

## 2019-01-03 ENCOUNTER — Other Ambulatory Visit: Payer: Self-pay

## 2019-01-03 DIAGNOSIS — G20 Parkinson's disease: Secondary | ICD-10-CM

## 2019-01-03 DIAGNOSIS — E119 Type 2 diabetes mellitus without complications: Secondary | ICD-10-CM

## 2019-01-03 DIAGNOSIS — N390 Urinary tract infection, site not specified: Secondary | ICD-10-CM

## 2019-01-03 NOTE — Patient Instructions (Addendum)
Thank you allowing the Chronic Care Management Team to be a part of your care! It was a pleasure speaking with you today!  1. Continue to limit your carbohydrate intake although your fasting blood sugars are not terrible. 2.  I am happy you have foods you enjoy!! 3. Continue to take your medications as prescribed. 4. Dont forget your virtual visit with Dr. Manuella Ghazi 6/25 5. Please call me if you have an needs, questions, or concerns    CCM (Chronic Care Management) Team   Trish Fountain RN, BSN Nurse Care Coordinator  [phone]  [name] PharmD  Clinical Pharmacist  [phone]   [place], LCSW Clinical Social Worker [phone]  Goals Addressed            This Visit's Progress   . I was recently diagnoses with Diabetes and I want to make sure I am doing what I should to get my sugars down (pt-stated)       Current Barriers:  [name] Knowledge Deficits related to basic Diabetes pathophysiology and self care/management . Inability to self care secondary to Parkinsons  Nurse Case Manager Clinical Goal(s):  [name] Over the next 14 days, patient will demonstrate continued adherence to prescribed treatment plan for diabetes self care/management as evidenced by checking blood glucose levels as prescribed, adhering to ADA/low carb diet, and medication adherence-goal met 11/09/2018 . Over the next 90 days, patient will have a reduction in A1C as evidenced by chart review and patient verbalization  Interventions:  .  Assessed for medication compliance . Reviewed CBG log and reinforced education on how certain foods can increase blood sugars . Reviewed scheduled/upcoming provider appointments including: virtual visit with neurology on Thursday 01/05/2019 . Reinforced importance of daily CBG checks and record, calling Dr. Rosanna Randy for findings outside established parameters.    Patient Self Care Activities:  . Self administers medications as prescribed . Attends all scheduled  provider appointments . Checks blood sugars as prescribed and utilize hyper and hypoglycemia protocol as needed . Adhere to ADA diet as discussed . Patient/Caregivers will follow CDC guidelines for COVID-19 infection prevention   Plan:  . RNCM will follow up with patient in 30 days   Please see past updates related to this goal by clicking on the "Past Updates" button in the selected goal      . COMPLETED: My urine was really dark and it burned when I urinated but it is getting better (pt-stated)       Current Barriers:  [name] Knowledge Deficits related to UTI prevention  Nurse Case Manager Clinical Goal(s):  [name] Over the next 60 days, patient will not experience a hospitalization related to UTI . Over the next 14 days, patient will report ongoing or worsening UTI symptoms to provider-goal met 11/22/2018  Interventions:   Assessed for resolution of UTI symptoms  Encouraged patient to increase H2O until urine is pale yellow. May want to add 100% Cranberry Juice into diet as additional UTI prevention strategy. . Advised patient to call Dr. Rosanna Randy as needed with fever >100.4, pain upon urination, fowl smelling urine, dark urine . Reinforced education to patient re: UTI signs and symptoms  Patient Self Care Activities:  . Currently UNABLE TO independently perfomr ADLs and IALDs . Self administers medications as prescribed . Attends all scheduled provider appointments . Calls provider office for new concerns or questions  Please see past updates related to this goal by clicking on the "Past Updates" button in the selected goal  The patient verbalized understanding of instructions provided today and declined a print copy of patient instruction materials.   Telephone follow up appointment with care management team member scheduled for: 1 month  SYMPTOMS OF A STROKE   You have any symptoms of stroke. "BE FAST" is an easy way to remember the main warning signs: ? B - Balance.  Signs are dizziness, sudden trouble walking, or loss of balance. ? E - Eyes. Signs are trouble seeing or a sudden change in how you see. ? F - Face. Signs are sudden weakness or loss of feeling of the face, or the face or eyelid drooping on one side. ? A - Arms. Signs are weakness or loss of feeling in an arm. This happens suddenly and usually on one side of the body. ? S - Speech. Signs are sudden trouble speaking, slurred speech, or trouble understanding what people say. ? T - Time. Time to call emergency services. Write down what time symptoms started.  You have other signs of stroke, such as: ? A sudden, very bad headache with no known cause. ? Feeling sick to your stomach (nausea). ? Throwing up (vomiting). ? Jerky movements you cannot control (seizure).  SYMPTOMS OF A HEART ATTACK  What are the signs or symptoms? Symptoms of this condition include:  Chest pain. It may feel like: ? Crushing or squeezing. ? Tightness, pressure, fullness, or heaviness.  Pain in the arm, neck, jaw, back, or upper body.  Shortness of breath.  Heartburn.  Indigestion.  Nausea.  Cold sweats.  Feeling tired.  Sudden lightheadedness.

## 2019-01-03 NOTE — Chronic Care Management (AMB) (Signed)
Chronic Care Management   Follow Up Note   01/03/2019 Name: ERNESTINE LANGWORTHY MRN: [id number] DOB: Aug 09, 1940  Referred by: [name]., MD Reason for referral : No chief complaint on file.   Subjective: " I am doing OK. No more Urinary Track Infection symptoms, but my sugars are a little higher than I like"   Objective:  Lab Results  Component Value Date   HGBA1C 7.3 (H) 06/02/2018    Assessment: Mr. Danzig Macgregor, 78 year old male patient of Dr. Miguel Aschoff. Mr. Dotson was referred to CCM RN CM by Jackelyn Poling, [facility] Community RN CM. Patient is currently work with [facility] Pharmacist on medication management and affordability. Mr Winrow has a history of but not limited to HTN, Chronic venous insufficiency, Idiopathic Parkinson's, prostate CA, Hyperlipidemia, and newly diagnoses with DM. CCM RN CM completed initial assessment and education related to DM self careon 10/10/2018. Mr. Gargis has been followed monthly to assess for additional needs.  Review of patient status, including review of consultants reports, relevant laboratory and other test results, and collaboration with appropriate care team members and the patient's provider was performed as part of comprehensive patient evaluation and provision of chronic care management services.    Goals Addressed            This Visit's Progress   . I was recently diagnoses with Diabetes and I want to make sure I am doing what I should to get my sugars down (pt-stated)       Mr. Dimichele continues to follow his DM self management plan of care. His caregivers are checking is fasting blood sugars daily. He reports numbers in the 150s which is unsatisfactory to him. He understands why his number are higher. He admits to eating fresh peaches and fresh fruits deserts occasionally. Mr. Noyola and his caregivers are managing his parkinson's well. He has acknowledges a virtual visit with his neurologist on Thursday.  Current  Barriers:  [name] Knowledge Deficits related to basic Diabetes pathophysiology and self care/management . Inability to self care secondary to Parkinsons  Nurse Case Manager Clinical Goal(s):  [name] Over the next 14 days, patient will demonstrate continued adherence to prescribed treatment plan for diabetes self care/management as evidenced by checking blood glucose levels as prescribed, adhering to ADA/low carb diet, and medication adherence-goal met 11/09/2018 . Over the next 90 days, patient will have a reduction in A1C as evidenced by chart review and patient verbalization  Interventions:  .  Assessed for medication compliance . Reviewed CBG log and reinforced education on how certain foods can increase blood sugars . Reviewed scheduled/upcoming provider appointments including: virtual visit with neurology on Thursday 01/05/2019 . Reinforced importance of daily CBG checks and record, calling Dr. Rosanna Randy for findings outside established parameters.    Patient Self Care Activities:  . Self administers medications as prescribed . Attends all scheduled provider appointments . Checks blood sugars as prescribed and utilize hyper and hypoglycemia protocol as needed . Adhere to ADA diet as discussed . Patient/Caregivers will follow CDC guidelines for COVID-19 infection prevention   Plan:  . RNCM will follow up with patient in 30 days   Please see past updates related to this goal by clicking on the "Past Updates" button in the selected goal      . COMPLETED: My urine was really dark and it burned when I urinated but it is getting better (pt-stated)       Mr. Wanat states he is  continuing to make sure he remains hydrated. He attempts to drink enough fluids throughout the day to produce pale yellow urine. He monitors the odor of his urine. Denies at present. He continues to drink a small amount of cranberry juice daily.   Current Barriers:  [name] Knowledge Deficits related to UTI prevention  Nurse  Case Manager Clinical Goal(s):  [name] Over the next 60 days, patient will not experience a hospitalization related to UTI . Over the next 14 days, patient will report ongoing or worsening UTI symptoms to provider-goal met 11/22/2018  Interventions:   Assessed for resolution of UTI symptoms  Encouraged patient to increase H2O until urine is pale yellow. May want to add 100% Cranberry Juice into diet as additional UTI prevention strategy. . Advised patient to call Dr. Rosanna Randy as needed with fever >100.4, pain upon urination, fowl smelling urine, dark urine . Reinforced education to patient re: UTI signs and symptoms  Patient Self Care Activities:  . Currently UNABLE TO independently perfomr ADLs and IALDs . Self administers medications as prescribed . Attends all scheduled provider appointments . Calls provider office for new concerns or questions  Please see past updates related to this goal by clicking on the "Past Updates" button in the selected goal          Telephone follow up appointment with care management team member scheduled for: 30 days  Othel Dicostanzo E. Rollene Rotunda, RN, BSN Nurse Care Coordinator [facility] Practice/[facility] Care Management [phone]

## 2019-01-06 ENCOUNTER — Ambulatory Visit: Payer: Self-pay | Admitting: Pharmacist

## 2019-01-06 DIAGNOSIS — G250 Essential tremor: Secondary | ICD-10-CM

## 2019-01-06 DIAGNOSIS — G20 Parkinson's disease: Secondary | ICD-10-CM

## 2019-01-06 NOTE — Chronic Care Management (AMB) (Signed)
  Chronic Care Management   Follow Up Note   01/06/2019 Name: Roger Rodriguez MRN: [id number] DOB: 01-20-1941  Subjective Roger Rodriguez is a 78 y.o. year old male who is a primary care patient of Roger Rodriguez., MD. The CCM clinical pharmacist was consulted for medication assistance ([place]) via internal consult from [facility].   Assessment: Review of patient status, including review of consultants reports, relevant laboratory and other test results, and collaboration with appropriate care team members and the patient's provider was performed as part of comprehensive patient evaluation and provision of chronic care management services.    Goals Addressed            This Visit's Progress   . I got a letter from the [facility] again (pt-stated)       Current Barriers:  . Diminished manual dexterity . Financial Barriers  Pharmacist Clinical Goal(s):  [name] Over the next 10 days, patient will work with pharmacist to address needs related to [employer]    Interventions: . Collaboration with [facility] community pharmacy   . Collaboration with [employer]   Patient Self Care Activities:  . Calls pharmacy for medication refills- making sure that they are billing his grant through the foundation . Reschedule visit with Dr. Manuella Rodriguez  Please see past updates related to this goal by clicking on the "Past Updates" button in the selected goal         Plan:  Renewed Parkinsons grant from [facility] for Roger Rodriguez. Will also work for Roger Rodriguez.   Member ID: [id number] Group ID: [id number]  RxBIN [id number] PCN [id number]  Eligibility start date: 01/19/19 Eligibility end date: 01/18/20  Assistance amount: $3200  Follow up: Telephone follow up appointment with care management team member scheduled for: telelphone follow up with patient and community pharmacy in [place] days  [name], PharmD Clinical Pharmacist [place] [phone]

## 2019-01-17 DIAGNOSIS — G20 Parkinson's disease: Secondary | ICD-10-CM | POA: Diagnosis not present

## 2019-01-17 DIAGNOSIS — M9711XS Periprosthetic fracture around internal prosthetic right knee joint, sequela: Secondary | ICD-10-CM | POA: Diagnosis not present

## 2019-01-17 DIAGNOSIS — R262 Difficulty in walking, not elsewhere classified: Secondary | ICD-10-CM | POA: Diagnosis not present

## 2019-01-17 DIAGNOSIS — M6281 Muscle weakness (generalized): Secondary | ICD-10-CM | POA: Diagnosis not present

## 2019-01-17 DIAGNOSIS — T8453XD Infection and inflammatory reaction due to internal right knee prosthesis, subsequent encounter: Secondary | ICD-10-CM | POA: Diagnosis not present

## 2019-02-02 ENCOUNTER — Other Ambulatory Visit: Payer: Self-pay

## 2019-02-02 ENCOUNTER — Ambulatory Visit: Payer: Self-pay
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
## 2019-02-03 ENCOUNTER — Ambulatory Visit: Payer: Self-pay

## 2019-02-03 DIAGNOSIS — E119 Type 2 diabetes mellitus without complications: Secondary | ICD-10-CM

## 2019-02-03 NOTE — Chronic Care Management (AMB) (Signed)
Chronic Care Management   Follow Up Note   02/03/2019 Name: HYLAND MOLLENKOPF MRN: [id number] DOB: [birth year]  Referred by: [name]., MD Reason for referral : Chronic Care Management (follow up DM)   Subjective: "I have good days and bad days but today is a good day"   Objective:  Lab Results  Component Value Date   HGBA1C 7.3 (H) 06/02/2018   HGBA1C 6.0 04/02/2016   HGBA1C 6.1 (H) 10/02/2015   Lab Results  Component Value Date   LDLCALC 138 (H) 08/15/2018   CREATININE 1.05 08/15/2018    Assessment: Mr. Reeves Musick, 78 year old male patient of Dr. Miguel Aschoff. Mr. Gaymon was referred to CCM RN CM by Jackelyn Poling, [facility] Community RN CM. Patient is currently work with [facility] Pharmacist on medication management and affordability. Mr Bartko has a history of but not limited to HTN, Chronic venous insufficiency, Idiopathic Parkinson's, prostate CA, Hyperlipidemia, and newly diagnoses with DM. CCM RN CM completed initial assessment and education related to DM self careon 10/10/2018. Mr. Grainger has been followed monthly to assess for additional needs.  Review of patient status, including review of consultants reports, relevant laboratory and other test results, and collaboration with appropriate care team members and the patient's provider was performed as part of comprehensive patient evaluation and provision of chronic care management services.    Goals Addressed            This Visit's Progress   . I was recently diagnoses with Diabetes and I want to make sure I am doing what I should to get my sugars down (pt-stated)       Mr. Heatherly reports doing well. Caregivers continue to check blood sugars and blood pressures daily. Reports fasting blood sugars from 130-145 and BP 120s/70s. He continues to limit his carb intake and has recently found sugar free pop sickles that satisfies his sweet tooth and desire to eat something cool and refreshing. He continues to take  all his medications as prescribed. He is unable to participate in cardiovascular exercise secondary to his idiopathic parkinsons disease. It is managed by medications but the tremors prevent him from exercising safely.  Current Barriers:  [name] Knowledge Deficits related to basic Diabetes pathophysiology and self care/management . Inability to self care secondary to Parkinsons  Nurse Case Manager Clinical Goal(s):  [name] Over the next 90 days, patient will have a reduction in A1C as evidenced by chart review and patient verbalization  Interventions:  .  Assessed for medication compliance . Reviewed CBG log and reinforced education on how certain foods can increase blood sugars . Reviewed scheduled/upcoming provider appointments including: virtual visit with neurology on Thursday 02/07/2019 . Reinforced importance of daily CBG checks and record, calling Dr. Rosanna Randy for findings outside established parameters.    Patient Self Care Activities:  . Self administers medications as prescribed . Attends all scheduled provider appointments . Checks blood sugars as prescribed and utilize hyper and hypoglycemia protocol as needed . Adhere to ADA diet as discussed . Patient/Caregivers will follow CDC guidelines for COVID-19 infection prevention   Please see past updates related to this goal by clicking on the "Past Updates" button in the selected goal     . COMPLETED: I'm trying to not get this virus (pt-stated)       Current Barriers:  [name] Knowledge Deficits related to COVID-19 and impact on patient self health management  Clinical Goal(s):  [name] Over the next 30 days, patient  will verbalize basic understanding of COVID-19 impact on individual health and self health management as evidenced by verbalization of basic understanding of COVID-19 as a viral disease, measures to prevent exposure, signs and symptoms, when to contact provider  Interventions: . Advised patient, providing education on social  distancing, infection prevention, and proper handwashing technique, and when to call provider or go to the ED  Patient Self Care Activities:   Practice social distancing, maintaining six feet between all persons; proper handwashing; wear a mask if leaving the home; limit errands such as groceries   Initial goal documentation          Telephone follow up appointment with care management team member scheduled for: 30 days   Soren Lazarz E. Rollene Rotunda, RN, BSN Nurse Care Coordinator [facility] Practice/[facility] Care Management [phone]

## 2019-02-05 NOTE — Patient Instructions (Signed)
Thank you allowing the Chronic Care Management Team to be a part of your care! It was a pleasure speaking with you today!   1. Continue to limit your carbohydrate intake although your fasting blood sugars are good 2.  I am happy you have foods you enjoy!! 3. Continue to take your medications as prescribed. 4. Dont forget your virtual visit with Dr. Manuella Ghazi 02/07/2019 5. If you develop additional UTI symptoms please call Dr. Marlan Palau office to see if you can provide a urine sample. Continue to drink your cranberry juice and drink plenty of water. 6. Please call me if you have an needs, questions, or concerns  CCM (Chronic Care Management) Team   Trish Fountain RN, BSN Nurse Care Coordinator  [phone]  [name] PharmD  Clinical Pharmacist  [phone]   [place], LCSW Clinical Social Worker [phone]  Goals Addressed            This Visit's Progress   . I was recently diagnoses with Diabetes and I want to make sure I am doing what I should to get my sugars down (pt-stated)   On track    Current Barriers:  [name] Knowledge Deficits related to basic Diabetes pathophysiology and self care/management . Inability to self care secondary to Parkinsons  Nurse Case Manager Clinical Goal(s):  [name] Over the next 90 days, patient will have a reduction in A1C as evidenced by chart review and patient verbalization  Interventions:  .  Assessed for medication compliance . Reviewed CBG log and reinforced education on how certain foods can increase blood sugars . Reviewed scheduled/upcoming provider appointments including: virtual visit with neurology on Thursday 02/07/2019 . Reinforced importance of daily CBG checks and record, calling Dr. Rosanna Randy for findings outside established parameters.    Patient Self Care Activities:  . Self administers medications as prescribed . Attends all scheduled provider appointments . Checks blood sugars as prescribed and utilize hyper and  hypoglycemia protocol as needed . Adhere to ADA diet as discussed . Patient/Caregivers will follow CDC guidelines for COVID-19 infection prevention   Please see past updates related to this goal by clicking on the "Past Updates" button in the selected goal      . COMPLETED: I'm trying to not get this virus (pt-stated)       Current Barriers:  [name] Knowledge Deficits related to COVID-19 and impact on patient self health management  Clinical Goal(s):  [name] Over the next 30 days, patient will verbalize basic understanding of COVID-19 impact on individual health and self health management as evidenced by verbalization of basic understanding of COVID-19 as a viral disease, measures to prevent exposure, signs and symptoms, when to contact provider  Interventions: . Advised patient, providing education on social distancing, infection prevention, and proper handwashing technique, and when to call provider or go to the ED  Patient Self Care Activities:   Practice social distancing, maintaining six feet between all persons; proper handwashing; wear a mask if leaving the home; limit errands such as groceries   Initial goal documentation                       The patient verbalized understanding of instructions provided today and declined a print copy of patient instruction materials.   Telephone follow up appointment with care management team member scheduled for: 30 days  SYMPTOMS OF A STROKE   You have any symptoms of stroke. "BE FAST" is an easy way to remember the  main warning signs: ? B - Balance. Signs are dizziness, sudden trouble walking, or loss of balance. ? E - Eyes. Signs are trouble seeing or a sudden change in how you see. ? F - Face. Signs are sudden weakness or loss of feeling of the face, or the face or eyelid drooping on one side. ? A - Arms. Signs are weakness or loss of feeling in an arm. This happens suddenly and usually on one side of the body. ? S -  Speech. Signs are sudden trouble speaking, slurred speech, or trouble understanding what people say. ? T - Time. Time to call emergency services. Write down what time symptoms started.  You have other signs of stroke, such as: ? A sudden, very bad headache with no known cause. ? Feeling sick to your stomach (nausea). ? Throwing up (vomiting). ? Jerky movements you cannot control (seizure).  SYMPTOMS OF A HEART ATTACK  What are the signs or symptoms? Symptoms of this condition include:  Chest pain. It may feel like: ? Crushing or squeezing. ? Tightness, pressure, fullness, or heaviness.  Pain in the arm, neck, jaw, back, or upper body.  Shortness of breath.  Heartburn.  Indigestion.  Nausea.  Cold sweats.  Feeling tired.  Sudden lightheadedness.

## 2019-02-07 DIAGNOSIS — G20 Parkinson's disease: Secondary | ICD-10-CM | POA: Diagnosis not present

## 2019-02-11 DIAGNOSIS — G2581 Restless legs syndrome: Secondary | ICD-10-CM | POA: Diagnosis not present

## 2019-02-11 DIAGNOSIS — Z853 Personal history of malignant neoplasm of breast: Secondary | ICD-10-CM | POA: Diagnosis not present

## 2019-02-11 DIAGNOSIS — I10 Essential (primary) hypertension: Secondary | ICD-10-CM | POA: Diagnosis not present

## 2019-02-11 DIAGNOSIS — F0280 Dementia in other diseases classified elsewhere without behavioral disturbance: Secondary | ICD-10-CM | POA: Diagnosis not present

## 2019-02-11 DIAGNOSIS — G250 Essential tremor: Secondary | ICD-10-CM | POA: Diagnosis not present

## 2019-02-11 DIAGNOSIS — Z9181 History of falling: Secondary | ICD-10-CM | POA: Diagnosis not present

## 2019-02-11 DIAGNOSIS — M1990 Unspecified osteoarthritis, unspecified site: Secondary | ICD-10-CM | POA: Diagnosis not present

## 2019-02-11 DIAGNOSIS — M48061 Spinal stenosis, lumbar region without neurogenic claudication: Secondary | ICD-10-CM | POA: Diagnosis not present

## 2019-02-11 DIAGNOSIS — N529 Male erectile dysfunction, unspecified: Secondary | ICD-10-CM | POA: Diagnosis not present

## 2019-02-11 DIAGNOSIS — G20 Parkinson's disease: Secondary | ICD-10-CM | POA: Diagnosis not present

## 2019-02-11 DIAGNOSIS — E785 Hyperlipidemia, unspecified: Secondary | ICD-10-CM | POA: Diagnosis not present

## 2019-02-17 DIAGNOSIS — M6281 Muscle weakness (generalized): Secondary | ICD-10-CM | POA: Diagnosis not present

## 2019-02-17 DIAGNOSIS — T8453XD Infection and inflammatory reaction due to internal right knee prosthesis, subsequent encounter: Secondary | ICD-10-CM | POA: Diagnosis not present

## 2019-02-17 DIAGNOSIS — G20 Parkinson's disease: Secondary | ICD-10-CM | POA: Diagnosis not present

## 2019-02-17 DIAGNOSIS — M9711XS Periprosthetic fracture around internal prosthetic right knee joint, sequela: Secondary | ICD-10-CM | POA: Diagnosis not present

## 2019-02-17 DIAGNOSIS — R262 Difficulty in walking, not elsewhere classified: Secondary | ICD-10-CM | POA: Diagnosis not present

## 2019-02-27 ENCOUNTER — Other Ambulatory Visit: Payer: Self-pay

## 2019-02-27 MED ORDER — NONFORMULARY OR COMPOUNDED ITEM: 6 refills | Status: DC

## 2019-02-28 ENCOUNTER — Telehealth: Payer: Self-pay | Admitting: Urology

## 2019-02-28 ENCOUNTER — Telehealth: Payer: Self-pay

## 2019-02-28 DIAGNOSIS — I10 Essential (primary) hypertension: Secondary | ICD-10-CM | POA: Diagnosis not present

## 2019-02-28 DIAGNOSIS — N529 Male erectile dysfunction, unspecified: Secondary | ICD-10-CM | POA: Diagnosis not present

## 2019-02-28 DIAGNOSIS — G20 Parkinson's disease: Secondary | ICD-10-CM | POA: Diagnosis not present

## 2019-02-28 DIAGNOSIS — M1990 Unspecified osteoarthritis, unspecified site: Secondary | ICD-10-CM | POA: Diagnosis not present

## 2019-02-28 DIAGNOSIS — E785 Hyperlipidemia, unspecified: Secondary | ICD-10-CM | POA: Diagnosis not present

## 2019-02-28 DIAGNOSIS — F0280 Dementia in other diseases classified elsewhere without behavioral disturbance: Secondary | ICD-10-CM | POA: Diagnosis not present

## 2019-02-28 DIAGNOSIS — G250 Essential tremor: Secondary | ICD-10-CM | POA: Diagnosis not present

## 2019-02-28 DIAGNOSIS — Z9181 History of falling: Secondary | ICD-10-CM | POA: Diagnosis not present

## 2019-02-28 DIAGNOSIS — M48061 Spinal stenosis, lumbar region without neurogenic claudication: Secondary | ICD-10-CM | POA: Diagnosis not present

## 2019-02-28 DIAGNOSIS — Z853 Personal history of malignant neoplasm of breast: Secondary | ICD-10-CM | POA: Diagnosis not present

## 2019-02-28 DIAGNOSIS — G2581 Restless legs syndrome: Secondary | ICD-10-CM | POA: Diagnosis not present

## 2019-02-28 MED ORDER — ENALAPRIL MALEATE 5 MG PO TABS: 5.0000 mg | ORAL_TABLET | Freq: Every day | ORAL | 1 refills | Status: DC

## 2019-02-28 NOTE — Telephone Encounter (Signed)
Increase to 5mg  daily for now--thanks

## 2019-02-28 NOTE — Telephone Encounter (Signed)
I declined Roger Rodriguez refill on his Trimix.  He has not been seen in the office since 2018.  He needs an office visit prior to his refill.

## 2019-02-28 NOTE — Telephone Encounter (Signed)
Horris Latino, a physical therapist called saying that the patient has had elevated BP for the last few days. She reports that every time she has came to visit, his BP has averaged in the 150s/90s.   Today, his BP reads 157/75. She reports that he is c/o fatigue, but denies chest pain, blurred vision, headaches, or shortness of breath.   He is currently taking enalapril 2.5mg  daily. Did you want to adjust this dose? Please advise. Thanks!

## 2019-02-28 NOTE — Telephone Encounter (Signed)
Advised patient as below. Medication was sent into the pharmacy.  

## 2019-03-01 NOTE — Progress Notes (Deleted)
03/02/2019 11:36 AM   [name] 1940/09/10 [id number]  Referring provider: [name]., MD [address] [place],  [place] [address]  No chief complaint on file.   HPI: Mr. Lilley is a 78 year old male with a history of prostate cancer, history of hematuria, history of kidney stones, OAB and ED who presents today for follow up.   History of prostate cancer  Patient has a personal history of prostate cancer and underwent open radical prostatectomy in March 2006 by Dr. Quillian Quince.  Pathology unknown.  NED.   Most recent PSA <0.1 on 02/2018.     History of hematuria (high risk) Former smoker.  Microsocpic hematuria times multiple. Status post CT urogram 4/17.  Cysto negative.  ***  History of kidney stones He does have a personal history of kidney stones.  He has had several ESWL, URS (2017) in the past.  Stone analysis Ca oxalate monohydrate 80%, Ca oxalate dihydrate 10%, Ca phosphate 5% and uric acid 5%.  24 hour urine 09/2016 showed adequate urinary volume to 2.48 L.  Urine Calcium is borderline elevated at 269 mg/day.  He also was noted to have borderline hyperoxaluria ( 44 mg/day).  On the day of the study, he reports that he tried to do a good job and drink more fluid than he typically does.  No recent stone episodes.   Bladder overactivity /urinary frequency Patient has significant urinary symptoms today including urinary frequency and SUI.  He does have trouble starting his stream and has to sit to void.   He is on Mybetriq  25 mg bid which helps.   He does have a history of Parkinson disease.  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  He has tried Viagra along with other PDE 5 inhibitors not affected. He has previously tried alprostadil injections with fairly decent success although finds these to be too expensive.   He has tried trimix and has done well with the last dosage.     PMH: Past Medical History:  Diagnosis Date  . Allergy    allergic rhinitis  . Arthritis    OA  . Chronic kidney disease    stones  . Depression   . Family history of adverse reaction to anesthesia   . Hematuria   . History of kidney stones   . Hyperlipidemia   . Hypertension    no meds since parkinsons meds started  . Incontinence of urine   . Inguinal hernia   . Neuromuscular disorder ([place])    parkinsons  . Overactive bladder   . Parkinson's disease ([place])   . Prostate cancer ([place])   . Rhinitis, allergic   . Shortness of breath dyspnea    doe secondary to parkinsons    Surgical History: Past Surgical History:  Procedure Laterality Date  . BACK SURGERY  2014  . CYSTOSCOPY WITH STENT PLACEMENT Right 01/13/2016   Procedure: CYSTOSCOPY WITH STENT PLACEMENT;  Surgeon: Hollice Espy, MD;  Location: [facility];  Service: Urology;  Laterality: Right;  . CYSTOSCOPY/URETEROSCOPY/HOLMIUM LASER/STENT PLACEMENT Left 11/20/2015   Procedure: CYSTOSCOPY/URETEROSCOPY/HOLMIUM LASER/STENT PLACEMENT;  Surgeon: Hollice Espy, MD;  Location: [facility];  Service: Urology;  Laterality: Left;  . EYE SURGERY    . FRACTURE SURGERY      ankle   . H/O arthroscopy of right knee  2015  . H/O radical protatectomy    .  HERNIA REPAIR    . JOINT REPLACEMENT     right knee  . LITHOTRIPSY  2010  . Rotator Cuff tear  1993   Repaired  . TOTAL KNEE REVISION Right 03/17/2018   Procedure: TOTAL KNEE REVISION;  Surgeon: Lovell Sheehan, MD;  Location: [facility];  Service: Orthopedics;  Laterality: Right;  . TOTAL KNEE REVISION Right 06/08/2018   Procedure: TOTAL KNEE REVISION;  Surgeon: Lovell Sheehan, MD;  Location: [facility];  Service: Orthopedics;  Laterality: Right;  . URETEROSCOPY WITH HOLMIUM LASER LITHOTRIPSY  2002?  [name] URETEROSCOPY WITH HOLMIUM LASER LITHOTRIPSY Right 01/13/2016   Procedure: URETEROSCOPY WITH HOLMIUM LASER LITHOTRIPSY;  Surgeon: Hollice Espy, MD;  Location: [facility];  Service: Urology;   Laterality: Right;  [name] VASECTOMY     History    Home Medications:  Allergies as of 03/02/2019      Reactions   Sulfa Antibiotics Swelling   Lip swelling      Medication List       Accurate as of March 01, 2019 11:36 AM. If you have any questions, ask your nurse or doctor.        ARTIFICIAL TEARS OP Place 1 drop into both eyes 4 (four) times daily as needed (for dry eyes).   aspirin EC 325 MG tablet Take 1 tablet (325 mg total) by mouth daily.   budesonide 32 MCG/ACT nasal spray Commonly known as: RHINOCORT AQUA USE 1 PUFF IN EACH NOSTRIL EVERY DAY AS NEEDED What changed: See the new instructions.   celecoxib 100 MG capsule Commonly known as: CELEBREX Take 1 capsule (100 mg total) by mouth 2 (two) times daily.   cholecalciferol 1000 units tablet Commonly known as: VITAMIN D Take 1,000 Units by mouth daily.   ciprofloxacin 500 MG tablet Commonly known as: Cipro Take 1 tablet (500 mg total) by mouth 2 (two) times daily. For 7 days   docusate sodium 100 MG capsule Commonly known as: COLACE Take 1 capsule (100 mg total) by mouth 2 (two) times daily. What changed:   when to take this  reasons to take this   enalapril 5 MG tablet Commonly known as: VASOTEC Take 1 tablet (5 mg total) by mouth daily.   gabapentin 400 MG capsule Commonly known as: NEURONTIN Take 1,200 mg by mouth 3 (three) times daily.   glucose blood test strip Commonly known as: Quintet AC Blood Glucose Test Use as instructed   insulin aspart 100 UNIT/ML injection Commonly known as: novoLOG Inject 0-15 Units into the skin 3 (three) times daily with meals.   loratadine 10 MG tablet Commonly known as: CLARITIN Take 10 mg by mouth daily.   metFORMIN 500 MG tablet Commonly known as: GLUCOPHAGE Take 1 tablet (500 mg total) by mouth 2 (two) times daily with a meal.   mirabegron ER 25 MG Tb24 tablet Commonly known as: Myrbetriq Myrbetriq 25 mg tablet,extended release  TAKE 1 TABLET (25 MG  TOTAL) 2 (TWO) TIMES DAILY BY MOUTH. What changed:   how much to take  how to take this  when to take this  additional instructions   multivitamin with minerals Tabs tablet Take 1 tablet by mouth daily.   NONFORMULARY OR COMPOUNDED ITEM Trimix (30/1/10)-(Pap/Phent/PGE)  Dosage: Inject 0.61ml as directed - may increase as directed per injection   Vial 68ml  Qty #10 Refills 6  [facility] [phone] Fax [phone]   oxyCODONE 5 MG immediate release tablet Commonly known as: Roxicodone Take 1 tablet (5 mg  total) by mouth every 6 (six) hours as needed for severe pain.   polyethylene glycol 17 g packet Commonly known as: MIRALAX / GLYCOLAX Take 17 g by mouth daily.   pramipexole 0.5 MG tablet Commonly known as: MIRAPEX Take 2 tablets (1 mg total) by mouth at bedtime. What changed:   how much to take  when to take this  additional instructions   Rytary 23.75-95 MG Cpcr Generic drug: Carbidopa-Levodopa ER Take 4 capsules by mouth 4 (four) times daily.   vitamin B-12 1000 MCG tablet Commonly known as: CYANOCOBALAMIN Take 1,000 mcg by mouth daily.   vitamin C 500 MG tablet Commonly known as: ASCORBIC ACID Take 500 mg by mouth daily.   vitamin E 400 UNIT capsule Generic drug: vitamin E Take 400 Units by mouth daily.       Allergies:  Allergies  Allergen Reactions  . Sulfa Antibiotics Swelling    Lip swelling    Family History: Family History  Problem Relation Age of Onset  . Dementia Mother   . Cerebral aneurysm Father   . Bladder Cancer Paternal Uncle   . Liver cancer Sister   . Prostate cancer Neg Hx   . Kidney cancer Neg Hx     Social History:  reports that he quit smoking about 50 years ago. His smoking use included cigars. He quit smokeless tobacco use about 15 years ago.  His smokeless tobacco use included chew. He reports that he does not drink alcohol or use drugs.  ROS:                                         Physical Exam: There were no vitals taken for this visit.  Constitutional:  Well nourished. Alert and oriented, No acute distress. HEENT: [place] AT, moist mucus membranes.  Trachea midline, no masses. Cardiovascular: No clubbing, cyanosis, or edema. Respiratory: Normal respiratory effort, no increased work of breathing. GI: Abdomen is soft, non tender, non distended, no abdominal masses. Liver and spleen not palpable.  No hernias appreciated.  Stool sample for occult testing is not indicated.   GU: No CVA tenderness.  No bladder fullness or masses.  Patient with circumcised/uncircumcised phallus. ***Foreskin easily retracted***  Urethral meatus is patent.  No penile discharge. No penile lesions or rashes. Scrotum without lesions, cysts, rashes and/or edema.  Testicles are located scrotally bilaterally. No masses are appreciated in the testicles. Left and right epididymis are normal. Rectal: Patient with  normal sphincter tone. Anus and perineum without scarring or rashes. No rectal masses are appreciated. Prostate is approximately *** grams, *** nodules are appreciated. Seminal vesicles are normal. Skin: No rashes, bruises or suspicious lesions. Lymph: No cervical or inguinal adenopathy. Neurologic: Grossly intact, no focal deficits, moving all 4 extremities. Psychiatric: Normal mood and affect.  Laboratory Data: Lab Results  Component Value Date   WBC 5.4 08/15/2018   HGB 13.5 08/15/2018   HCT 38.8 08/15/2018   MCV 88 08/15/2018   PLT 228 08/15/2018    Lab Results  Component Value Date   CREATININE 1.05 08/15/2018    Lab Results  Component Value Date   PSA <0.1 09/11/2010    No results found for: TESTOSTERONE  Lab Results  Component Value Date   HGBA1C 7.3 (H) 06/02/2018    Lab Results  Component Value Date   TSH 2.500 08/15/2018       Component  Value Date/Time   CHOL 223 (H) 08/15/2018 1142   HDL 33 (L) 08/15/2018 1142   CHOLHDL 5.1 (H) 03/02/2018 1338    LDLCALC 138 (H) 08/15/2018 1142    Lab Results  Component Value Date   AST 21 08/15/2018   Lab Results  Component Value Date   ALT 12 08/15/2018   No components found for: ALKALINEPHOPHATASE No components found for: BILIRUBINTOTAL  No results found for: ESTRADIOL  Urinalysis    Component Value Date/Time   COLORURINE YELLOW (A) 10/15/2017 0101   APPEARANCEUR Clear 02/02/2018 1245   LABSPEC 1.017 10/15/2017 0101   LABSPEC 1.013 03/21/2014 1433   PHURINE 5.0 10/15/2017 0101   GLUCOSEU Negative 02/02/2018 1245   GLUCOSEU Negative 03/21/2014 1433   HGBUR NEGATIVE 10/15/2017 0101   BILIRUBINUR negative 06/02/2018 1601   BILIRUBINUR Negative 02/02/2018 1245   BILIRUBINUR Negative 03/21/2014 1433   KETONESUR 5 (A) 10/15/2017 0101   PROTEINUR Negative 06/02/2018 1601   PROTEINUR Trace 02/02/2018 1245   PROTEINUR 30 (A) 10/15/2017 0101   UROBILINOGEN 0.2 06/02/2018 1601   NITRITE negative 06/02/2018 1601   NITRITE Negative 02/02/2018 1245   NITRITE NEGATIVE 10/15/2017 0101   LEUKOCYTESUR Negative 06/02/2018 1601   LEUKOCYTESUR Trace (A) 02/02/2018 1245   LEUKOCYTESUR Trace 03/21/2014 1433    I have reviewed the labs.   Pertinent Imaging: *** I have independently reviewed the films.    Assessment & Plan:  ***  1. ED  2. History of prostate cancer  3. History of hematuria  4. OAB  No follow-ups on file.  These notes generated with voice recognition software. I apologize for typographical errors.  Zara Council, PA-C  [facility] [address]  [place], [place] [address] [phone]

## 2019-03-02 ENCOUNTER — Telehealth: Payer: Self-pay

## 2019-03-02 ENCOUNTER — Ambulatory Visit: Payer: PPO | Admitting: Urology

## 2019-03-03 NOTE — Telephone Encounter (Signed)
App made patient is aware 

## 2019-03-06 ENCOUNTER — Other Ambulatory Visit: Payer: Self-pay | Admitting: Family Medicine

## 2019-03-15 DIAGNOSIS — I10 Essential (primary) hypertension: Secondary | ICD-10-CM | POA: Diagnosis not present

## 2019-03-15 DIAGNOSIS — G2581 Restless legs syndrome: Secondary | ICD-10-CM | POA: Diagnosis not present

## 2019-03-15 DIAGNOSIS — N529 Male erectile dysfunction, unspecified: Secondary | ICD-10-CM | POA: Diagnosis not present

## 2019-03-15 DIAGNOSIS — G20 Parkinson's disease: Secondary | ICD-10-CM | POA: Diagnosis not present

## 2019-03-15 DIAGNOSIS — M48061 Spinal stenosis, lumbar region without neurogenic claudication: Secondary | ICD-10-CM | POA: Diagnosis not present

## 2019-03-15 DIAGNOSIS — M1990 Unspecified osteoarthritis, unspecified site: Secondary | ICD-10-CM | POA: Diagnosis not present

## 2019-03-15 DIAGNOSIS — Z9181 History of falling: Secondary | ICD-10-CM | POA: Diagnosis not present

## 2019-03-15 DIAGNOSIS — Z853 Personal history of malignant neoplasm of breast: Secondary | ICD-10-CM | POA: Diagnosis not present

## 2019-03-15 DIAGNOSIS — G250 Essential tremor: Secondary | ICD-10-CM | POA: Diagnosis not present

## 2019-03-15 DIAGNOSIS — E785 Hyperlipidemia, unspecified: Secondary | ICD-10-CM | POA: Diagnosis not present

## 2019-03-15 DIAGNOSIS — F0280 Dementia in other diseases classified elsewhere without behavioral disturbance: Secondary | ICD-10-CM | POA: Diagnosis not present

## 2019-03-20 DIAGNOSIS — M9711XS Periprosthetic fracture around internal prosthetic right knee joint, sequela: Secondary | ICD-10-CM | POA: Diagnosis not present

## 2019-03-20 DIAGNOSIS — G20 Parkinson's disease: Secondary | ICD-10-CM | POA: Diagnosis not present

## 2019-03-20 DIAGNOSIS — R262 Difficulty in walking, not elsewhere classified: Secondary | ICD-10-CM | POA: Diagnosis not present

## 2019-03-20 DIAGNOSIS — M6281 Muscle weakness (generalized): Secondary | ICD-10-CM | POA: Diagnosis not present

## 2019-03-20 DIAGNOSIS — T8453XD Infection and inflammatory reaction due to internal right knee prosthesis, subsequent encounter: Secondary | ICD-10-CM | POA: Diagnosis not present

## 2019-03-21 ENCOUNTER — Telehealth: Payer: Self-pay

## 2019-03-21 NOTE — Telephone Encounter (Signed)
Pt would like to drop off a urine sample.  He thinks he has an UTI.  He states he does not want to make an appointment at this time secondary to COVID-19.    Contact number:  [phone]  Thanks,   -Mickel Baas

## 2019-03-21 NOTE — Telephone Encounter (Signed)
Patient advised.

## 2019-03-21 NOTE — Telephone Encounter (Signed)
Left message to call back  

## 2019-03-21 NOTE — Telephone Encounter (Signed)
Ok this time

## 2019-03-22 DIAGNOSIS — M48061 Spinal stenosis, lumbar region without neurogenic claudication: Secondary | ICD-10-CM | POA: Diagnosis not present

## 2019-03-22 DIAGNOSIS — F0280 Dementia in other diseases classified elsewhere without behavioral disturbance: Secondary | ICD-10-CM | POA: Diagnosis not present

## 2019-03-22 DIAGNOSIS — M1990 Unspecified osteoarthritis, unspecified site: Secondary | ICD-10-CM | POA: Diagnosis not present

## 2019-03-22 DIAGNOSIS — G250 Essential tremor: Secondary | ICD-10-CM | POA: Diagnosis not present

## 2019-03-22 DIAGNOSIS — G20 Parkinson's disease: Secondary | ICD-10-CM | POA: Diagnosis not present

## 2019-03-22 DIAGNOSIS — Z853 Personal history of malignant neoplasm of breast: Secondary | ICD-10-CM | POA: Diagnosis not present

## 2019-03-22 DIAGNOSIS — G2581 Restless legs syndrome: Secondary | ICD-10-CM | POA: Diagnosis not present

## 2019-03-22 DIAGNOSIS — I10 Essential (primary) hypertension: Secondary | ICD-10-CM | POA: Diagnosis not present

## 2019-03-22 DIAGNOSIS — E785 Hyperlipidemia, unspecified: Secondary | ICD-10-CM | POA: Diagnosis not present

## 2019-03-22 DIAGNOSIS — N529 Male erectile dysfunction, unspecified: Secondary | ICD-10-CM | POA: Diagnosis not present

## 2019-03-22 DIAGNOSIS — Z9181 History of falling: Secondary | ICD-10-CM | POA: Diagnosis not present

## 2019-03-23 ENCOUNTER — Ambulatory Visit: Payer: PPO | Admitting: Urology

## 2019-03-24 DIAGNOSIS — E785 Hyperlipidemia, unspecified: Secondary | ICD-10-CM | POA: Diagnosis not present

## 2019-03-24 DIAGNOSIS — M48061 Spinal stenosis, lumbar region without neurogenic claudication: Secondary | ICD-10-CM | POA: Diagnosis not present

## 2019-03-24 DIAGNOSIS — M1990 Unspecified osteoarthritis, unspecified site: Secondary | ICD-10-CM | POA: Diagnosis not present

## 2019-03-24 DIAGNOSIS — Z9181 History of falling: Secondary | ICD-10-CM | POA: Diagnosis not present

## 2019-03-24 DIAGNOSIS — N529 Male erectile dysfunction, unspecified: Secondary | ICD-10-CM | POA: Diagnosis not present

## 2019-03-24 DIAGNOSIS — G250 Essential tremor: Secondary | ICD-10-CM | POA: Diagnosis not present

## 2019-03-24 DIAGNOSIS — F0280 Dementia in other diseases classified elsewhere without behavioral disturbance: Secondary | ICD-10-CM | POA: Diagnosis not present

## 2019-03-24 DIAGNOSIS — I10 Essential (primary) hypertension: Secondary | ICD-10-CM | POA: Diagnosis not present

## 2019-03-24 DIAGNOSIS — G20 Parkinson's disease: Secondary | ICD-10-CM | POA: Diagnosis not present

## 2019-03-24 DIAGNOSIS — G2581 Restless legs syndrome: Secondary | ICD-10-CM | POA: Diagnosis not present

## 2019-03-24 DIAGNOSIS — Z853 Personal history of malignant neoplasm of breast: Secondary | ICD-10-CM | POA: Diagnosis not present

## 2019-03-29 ENCOUNTER — Other Ambulatory Visit: Payer: Self-pay

## 2019-03-29 DIAGNOSIS — N390 Urinary tract infection, site not specified: Secondary | ICD-10-CM

## 2019-03-31 ENCOUNTER — Telehealth: Payer: Self-pay | Admitting: Family Medicine

## 2019-03-31 DIAGNOSIS — G20 Parkinson's disease: Secondary | ICD-10-CM | POA: Diagnosis not present

## 2019-03-31 DIAGNOSIS — M1990 Unspecified osteoarthritis, unspecified site: Secondary | ICD-10-CM | POA: Diagnosis not present

## 2019-03-31 DIAGNOSIS — G2581 Restless legs syndrome: Secondary | ICD-10-CM | POA: Diagnosis not present

## 2019-03-31 DIAGNOSIS — E785 Hyperlipidemia, unspecified: Secondary | ICD-10-CM | POA: Diagnosis not present

## 2019-03-31 DIAGNOSIS — M48061 Spinal stenosis, lumbar region without neurogenic claudication: Secondary | ICD-10-CM | POA: Diagnosis not present

## 2019-03-31 DIAGNOSIS — G250 Essential tremor: Secondary | ICD-10-CM | POA: Diagnosis not present

## 2019-03-31 DIAGNOSIS — F0280 Dementia in other diseases classified elsewhere without behavioral disturbance: Secondary | ICD-10-CM | POA: Diagnosis not present

## 2019-03-31 DIAGNOSIS — Z853 Personal history of malignant neoplasm of breast: Secondary | ICD-10-CM | POA: Diagnosis not present

## 2019-03-31 DIAGNOSIS — Z9181 History of falling: Secondary | ICD-10-CM | POA: Diagnosis not present

## 2019-03-31 DIAGNOSIS — N529 Male erectile dysfunction, unspecified: Secondary | ICD-10-CM | POA: Diagnosis not present

## 2019-03-31 DIAGNOSIS — I10 Essential (primary) hypertension: Secondary | ICD-10-CM | POA: Diagnosis not present

## 2019-03-31 NOTE — Telephone Encounter (Signed)
Pt's enalapril (VASOTEC) 5 MG tablet   Directions on medicine is take 1 a day.  It was increased to 2 day.  He needs this to show now on his refills so his quantity will increase.   Thanks, [employer]

## 2019-04-01 LAB — URINE CULTURE

## 2019-04-03 ENCOUNTER — Telehealth: Payer: Self-pay | Admitting: Family Medicine

## 2019-04-03 MED ORDER — ENALAPRIL MALEATE 5 MG PO TABS: 5.0000 mg | ORAL_TABLET | Freq: Two times a day (BID) | ORAL | 1 refills | Status: DC

## 2019-04-03 NOTE — Telephone Encounter (Signed)
Patient notified of test results 

## 2019-04-03 NOTE — Telephone Encounter (Signed)
OK to do this-thx

## 2019-04-03 NOTE — Telephone Encounter (Signed)
Please review

## 2019-04-03 NOTE — Telephone Encounter (Signed)
Pt calling back for lab results.  Please call pt back.  Thanks, [employer]

## 2019-04-05 ENCOUNTER — Other Ambulatory Visit: Payer: Self-pay

## 2019-04-05 ENCOUNTER — Ambulatory Visit (INDEPENDENT_AMBULATORY_CARE_PROVIDER_SITE_OTHER): Payer: PPO

## 2019-04-05 DIAGNOSIS — Z23 Encounter for immunization: Secondary | ICD-10-CM

## 2019-04-19 DIAGNOSIS — M1990 Unspecified osteoarthritis, unspecified site: Secondary | ICD-10-CM | POA: Diagnosis not present

## 2019-04-19 DIAGNOSIS — G20 Parkinson's disease: Secondary | ICD-10-CM | POA: Diagnosis not present

## 2019-04-19 DIAGNOSIS — Z96651 Presence of right artificial knee joint: Secondary | ICD-10-CM | POA: Diagnosis not present

## 2019-04-19 DIAGNOSIS — G2581 Restless legs syndrome: Secondary | ICD-10-CM | POA: Diagnosis not present

## 2019-04-19 DIAGNOSIS — T8453XD Infection and inflammatory reaction due to internal right knee prosthesis, subsequent encounter: Secondary | ICD-10-CM | POA: Diagnosis not present

## 2019-04-19 DIAGNOSIS — F0280 Dementia in other diseases classified elsewhere without behavioral disturbance: Secondary | ICD-10-CM | POA: Diagnosis not present

## 2019-04-19 DIAGNOSIS — M48061 Spinal stenosis, lumbar region without neurogenic claudication: Secondary | ICD-10-CM | POA: Diagnosis not present

## 2019-04-19 DIAGNOSIS — E785 Hyperlipidemia, unspecified: Secondary | ICD-10-CM | POA: Diagnosis not present

## 2019-04-19 DIAGNOSIS — Z8546 Personal history of malignant neoplasm of prostate: Secondary | ICD-10-CM | POA: Diagnosis not present

## 2019-04-19 DIAGNOSIS — M6281 Muscle weakness (generalized): Secondary | ICD-10-CM | POA: Diagnosis not present

## 2019-04-19 DIAGNOSIS — R32 Unspecified urinary incontinence: Secondary | ICD-10-CM | POA: Diagnosis not present

## 2019-04-19 DIAGNOSIS — N529 Male erectile dysfunction, unspecified: Secondary | ICD-10-CM | POA: Diagnosis not present

## 2019-04-19 DIAGNOSIS — I10 Essential (primary) hypertension: Secondary | ICD-10-CM | POA: Diagnosis not present

## 2019-04-19 DIAGNOSIS — G250 Essential tremor: Secondary | ICD-10-CM | POA: Diagnosis not present

## 2019-04-19 DIAGNOSIS — M9711XS Periprosthetic fracture around internal prosthetic right knee joint, sequela: Secondary | ICD-10-CM | POA: Diagnosis not present

## 2019-04-19 DIAGNOSIS — Z9181 History of falling: Secondary | ICD-10-CM | POA: Diagnosis not present

## 2019-04-19 DIAGNOSIS — Z853 Personal history of malignant neoplasm of breast: Secondary | ICD-10-CM | POA: Diagnosis not present

## 2019-04-19 DIAGNOSIS — Z7984 Long term (current) use of oral hypoglycemic drugs: Secondary | ICD-10-CM | POA: Diagnosis not present

## 2019-04-19 DIAGNOSIS — R262 Difficulty in walking, not elsewhere classified: Secondary | ICD-10-CM | POA: Diagnosis not present

## 2019-04-19 DIAGNOSIS — F329 Major depressive disorder, single episode, unspecified: Secondary | ICD-10-CM | POA: Diagnosis not present

## 2019-05-18 DIAGNOSIS — G20 Parkinson's disease: Secondary | ICD-10-CM | POA: Diagnosis not present

## 2019-05-20 DIAGNOSIS — G20 Parkinson's disease: Secondary | ICD-10-CM | POA: Diagnosis not present

## 2019-05-20 DIAGNOSIS — T8453XD Infection and inflammatory reaction due to internal right knee prosthesis, subsequent encounter: Secondary | ICD-10-CM | POA: Diagnosis not present

## 2019-05-20 DIAGNOSIS — R262 Difficulty in walking, not elsewhere classified: Secondary | ICD-10-CM | POA: Diagnosis not present

## 2019-05-20 DIAGNOSIS — M9711XS Periprosthetic fracture around internal prosthetic right knee joint, sequela: Secondary | ICD-10-CM | POA: Diagnosis not present

## 2019-05-20 DIAGNOSIS — M6281 Muscle weakness (generalized): Secondary | ICD-10-CM | POA: Diagnosis not present

## 2019-06-02 ENCOUNTER — Telehealth: Payer: Self-pay | Admitting: Adult Health Nurse Practitioner

## 2019-06-02 NOTE — Telephone Encounter (Signed)
Spoke with patient regarding Palliative services, all questions were answered and he was in agreement with this.  I have scheduled a Telephone Consult for 06/12/19 @ 12 Noon.

## 2019-06-12 ENCOUNTER — Other Ambulatory Visit: Payer: Self-pay

## 2019-06-12 ENCOUNTER — Other Ambulatory Visit: Payer: PPO | Admitting: Adult Health Nurse Practitioner

## 2019-06-15 ENCOUNTER — Other Ambulatory Visit: Payer: Self-pay

## 2019-06-15 ENCOUNTER — Other Ambulatory Visit: Payer: PPO | Admitting: Adult Health Nurse Practitioner

## 2019-06-16 ENCOUNTER — Telehealth: Payer: Self-pay | Admitting: Adult Health Nurse Practitioner

## 2019-06-16 NOTE — Telephone Encounter (Signed)
This is late entry.  Attempted to call patient for telephone consult at scheduled time yesterday at 2:30.  Called the home phone and it seemed like someone was picking up and then the call would end suddenly.  Tried this a few times with no success.  Tried calling patient on his cell phone and was able to leave a VM with reason for call and call back number. Simcha Farrington K. Olena Heckle NP

## 2019-06-19 DIAGNOSIS — M9711XS Periprosthetic fracture around internal prosthetic right knee joint, sequela: Secondary | ICD-10-CM | POA: Diagnosis not present

## 2019-06-19 DIAGNOSIS — R262 Difficulty in walking, not elsewhere classified: Secondary | ICD-10-CM | POA: Diagnosis not present

## 2019-06-19 DIAGNOSIS — G20 Parkinson's disease: Secondary | ICD-10-CM | POA: Diagnosis not present

## 2019-06-19 DIAGNOSIS — M6281 Muscle weakness (generalized): Secondary | ICD-10-CM | POA: Diagnosis not present

## 2019-06-27 ENCOUNTER — Other Ambulatory Visit: Payer: Self-pay | Admitting: Family Medicine

## 2019-06-27 NOTE — Telephone Encounter (Signed)
Requested medication (s) are due for refill today: yes   Requested medication (s) are on the active medication list: yes  Last refill:  04/03/2019  Future visit scheduled: no  Notes to clinic: overdue for office visit  Review for refill   Requested Prescriptions  Pending Prescriptions Disp Refills   enalapril (VASOTEC) 5 MG tablet [Pharmacy Med Name: ENALAPRIL MALEATE 5 MG TABLET] 180 tablet     Sig: TAKE 1 TABLET BY MOUTH TWICE A DAY      Cardiovascular:  ACE Inhibitors Failed - 06/27/2019  2:34 AM      Failed - Cr in normal range and within 180 days    Creatinine  Date Value Ref Range Status  04/06/2014 0.94 0.60 - 1.30 mg/dL Final   Creatinine, Ser  Date Value Ref Range Status  08/15/2018 1.05 0.76 - 1.27 mg/dL Final          Failed - K in normal range and within 180 days    Potassium  Date Value Ref Range Status  08/15/2018 4.6 3.5 - 5.2 mmol/L Final  04/06/2014 3.8 3.5 - 5.1 mmol/L Final          Failed - Valid encounter within last 6 months    Recent Outpatient Visits           10 months ago Diabetes mellitus without complication [facility])   [facility] [name]., MD   1 year ago Pre-op examination   [facility] [name]., MD   1 year ago Encounter for annual physical exam   [facility] [name]., MD   1 year ago Right knee pain, unspecified chronicity   [facility] [name]., MD   1 year ago Essential (primary) hypertension   [facility] [name]., MD              Passed - Patient is not pregnant      Passed - Last BP in normal range    BP Readings from Last 1 Encounters:  09/01/18 128/60

## 2019-06-29 ENCOUNTER — Other Ambulatory Visit: Payer: Self-pay

## 2019-06-29 ENCOUNTER — Other Ambulatory Visit: Payer: PPO | Admitting: Adult Health Nurse Practitioner

## 2019-06-29 ENCOUNTER — Telehealth: Payer: Self-pay | Admitting: Adult Health Nurse Practitioner

## 2019-06-29 NOTE — Telephone Encounter (Signed)
Error in previous telephone encounter.  Next appointment is on 12/30 not 12/28 at 2pm. Yassmine Tamm K. Olena Heckle NP

## 2019-06-29 NOTE — Telephone Encounter (Signed)
Called patient for scheduled telephone visit.  Wanted to reschedule for an in person visit later due to a lot going on right now.  Rescheduled for an in person visit on 12/28 at 2pm. Margarete Horace K. Olena Heckle NP

## 2019-07-05 ENCOUNTER — Other Ambulatory Visit: Payer: Self-pay | Admitting: Family Medicine

## 2019-07-05 NOTE — Telephone Encounter (Signed)
Will watch for this fax.

## 2019-07-05 NOTE — Telephone Encounter (Signed)
Pt needs an order for a compression  wrap for his leg circulation/ Pt stated that a fax will be sent to office from  [facility] for th Rx request / please advise and call Pt once this order or Rx is sent in

## 2019-07-05 NOTE — Telephone Encounter (Signed)
From [facility] 

## 2019-07-12 ENCOUNTER — Encounter: Payer: Self-pay | Admitting: Physician Assistant

## 2019-07-12 ENCOUNTER — Ambulatory Visit (INDEPENDENT_AMBULATORY_CARE_PROVIDER_SITE_OTHER): Payer: PPO | Admitting: Physician Assistant

## 2019-07-12 ENCOUNTER — Other Ambulatory Visit: Payer: Self-pay

## 2019-07-12 ENCOUNTER — Other Ambulatory Visit: Payer: PPO | Admitting: Adult Health Nurse Practitioner

## 2019-07-12 ENCOUNTER — Telehealth: Payer: Self-pay

## 2019-07-12 VITALS — BP 155/88 | HR 87 | Ht 72.0 in | Wt 200.0 lb

## 2019-07-12 DIAGNOSIS — R350 Frequency of micturition: Secondary | ICD-10-CM | POA: Diagnosis not present

## 2019-07-12 DIAGNOSIS — R300 Dysuria: Secondary | ICD-10-CM | POA: Diagnosis not present

## 2019-07-12 DIAGNOSIS — Z515 Encounter for palliative care: Secondary | ICD-10-CM

## 2019-07-12 DIAGNOSIS — Z8546 Personal history of malignant neoplasm of prostate: Secondary | ICD-10-CM | POA: Diagnosis not present

## 2019-07-12 DIAGNOSIS — Z87442 Personal history of urinary calculi: Secondary | ICD-10-CM

## 2019-07-12 DIAGNOSIS — G20 Parkinson's disease: Secondary | ICD-10-CM

## 2019-07-12 LAB — URINALYSIS, COMPLETE
Bilirubin, UA: NEGATIVE
Glucose, UA: NEGATIVE
Nitrite, UA: POSITIVE — AB
Specific Gravity, UA: 1.025 (ref 1.005–1.030)
Urobilinogen, Ur: 1 mg/dL (ref 0.2–1.0)
pH, UA: 5.5 (ref 5.0–7.5)

## 2019-07-12 LAB — MICROSCOPIC EXAMINATION
Epithelial Cells (non renal): NONE SEEN /hpf (ref 0–10)
RBC: NONE SEEN /hpf (ref 0–2)
WBC, UA: >30 /hpf — AB (ref 0–5)

## 2019-07-12 MED ORDER — NITROFURANTOIN MONOHYD MACRO 100 MG PO CAPS: 100.0000 mg | ORAL_CAPSULE | Freq: Two times a day (BID) | ORAL | 0 refills | Status: AC

## 2019-07-12 NOTE — Progress Notes (Signed)
07/12/2019 12:18 PM   Roger Rodriguez [birth year] [id number]  CC: Dysuria, urgency, frequency, low back pain, gross hematuria  HPI: Roger Rodriguez is a 78 y.o. male who presents today for evaluation of possible UTI. He is an established [facility] patient with PMH prostate cancer s/p radical prostatectomy, SUI, OAB, microscopic hematuria, and nephrolithiasis who last saw Dr. Erlene Quan on 04/13/2017 for routine follow-up.  He reports a 2-day history of dysuria, urgency, frequency, low back pain, and nausea.  He also noted some bloody seepage in his absorbent diaper yesterday, since resolved. He denies fever, chills, and vomiting.  In-office UA today positive for 3+ blood, 2+ protein, nitrites, and trace leukocyte esterase; urine microscopy with >30 WBCs/HPF and many bacteria.   PMH: Past Medical History:  Diagnosis Date  . Allergy    allergic rhinitis  . Arthritis    OA  . Chronic kidney disease    stones  . Depression   . Family history of adverse reaction to anesthesia   . Hematuria   . History of kidney stones   . Hyperlipidemia   . Hypertension    no meds since parkinsons meds started  . Incontinence of urine   . Inguinal hernia   . Neuromuscular disorder ([place])    parkinsons  . Overactive bladder   . Parkinson's disease ([place])   . Prostate cancer ([place])   . Rhinitis, allergic   . Shortness of breath dyspnea    doe secondary to parkinsons    Surgical History: Past Surgical History:  Procedure Laterality Date  . BACK SURGERY  2014  . CYSTOSCOPY WITH STENT PLACEMENT Right 01/13/2016   Procedure: CYSTOSCOPY WITH STENT PLACEMENT;  Surgeon: Hollice Espy, MD;  Location: [facility];  Service: Urology;  Laterality: Right;  . CYSTOSCOPY/URETEROSCOPY/HOLMIUM LASER/STENT PLACEMENT Left 11/20/2015   Procedure: CYSTOSCOPY/URETEROSCOPY/HOLMIUM LASER/STENT PLACEMENT;  Surgeon: Hollice Espy, MD;  Location: [facility];  Service: Urology;  Laterality: Left;  . EYE SURGERY    . FRACTURE SURGERY       ankle   . H/O arthroscopy of right knee  2015  . H/O radical protatectomy    . HERNIA REPAIR    . JOINT REPLACEMENT     right knee  . LITHOTRIPSY  2010  . Rotator Cuff tear  1993   Repaired  . TOTAL KNEE REVISION Right 03/17/2018   Procedure: TOTAL KNEE REVISION;  Surgeon: Lovell Sheehan, MD;  Location: [facility];  Service: Orthopedics;  Laterality: Right;  . TOTAL KNEE REVISION Right 06/08/2018   Procedure: TOTAL KNEE REVISION;  Surgeon: Lovell Sheehan, MD;  Location: [facility];  Service: Orthopedics;  Laterality: Right;  . URETEROSCOPY WITH HOLMIUM LASER LITHOTRIPSY  2002?  [name] URETEROSCOPY WITH HOLMIUM LASER LITHOTRIPSY Right 01/13/2016   Procedure: URETEROSCOPY WITH HOLMIUM LASER LITHOTRIPSY;  Surgeon: Hollice Espy, MD;  Location: [facility];  Service: Urology;  Laterality: Right;  [name] VASECTOMY     History    Home Medications:  Allergies as of 07/12/2019      Reactions   Sulfa Antibiotics Swelling   Lip swelling      Medication List       Accurate as of July 12, 2019 12:18 PM. If you have any questions, ask your nurse or doctor.        ARTIFICIAL TEARS OP Place 1 drop into both eyes 4 (four) times daily as needed (for dry eyes).   aspirin EC 325 MG tablet Take 1 tablet (325 mg total) by mouth daily.  budesonide 32 MCG/ACT nasal spray Commonly known as: RHINOCORT AQUA USE 1 PUFF IN EACH NOSTRIL EVERY DAY AS NEEDED What changed: See the new instructions.   celecoxib 100 MG capsule Commonly known as: CELEBREX TAKE 1 CAPSULE BY MOUTH TWICE A DAY   cholecalciferol 1000 units tablet Commonly known as: VITAMIN D Take 1,000 Units by mouth daily.   ciprofloxacin 500 MG tablet Commonly known as: Cipro Take 1 tablet (500 mg total) by mouth 2 (two) times daily. For 7 days   docusate sodium 100 MG capsule Commonly known as: COLACE Take 1 capsule (100 mg total) by mouth 2 (two) times daily. What changed:   when to take this  reasons to take this   enalapril  5 MG tablet Commonly known as: VASOTEC TAKE 1 TABLET BY MOUTH TWICE A DAY   gabapentin 400 MG capsule Commonly known as: NEURONTIN Take 1,200 mg by mouth 3 (three) times daily.   glucose blood test strip Commonly known as: Quintet AC Blood Glucose Test Use as instructed   insulin aspart 100 UNIT/ML injection Commonly known as: novoLOG Inject 0-15 Units into the skin 3 (three) times daily with meals.   loratadine 10 MG tablet Commonly known as: CLARITIN Take 10 mg by mouth daily.   metFORMIN 500 MG tablet Commonly known as: GLUCOPHAGE Take 1 tablet (500 mg total) by mouth 2 (two) times daily with a meal.   mirabegron ER 25 MG Tb24 tablet Commonly known as: Myrbetriq Myrbetriq 25 mg tablet,extended release  TAKE 1 TABLET (25 MG TOTAL) 2 (TWO) TIMES DAILY BY MOUTH. What changed:   how much to take  how to take this  when to take this  additional instructions   multivitamin with minerals Tabs tablet Take 1 tablet by mouth daily.   nitrofurantoin (macrocrystal-monohydrate) 100 MG capsule Commonly known as: MACROBID Take 1 capsule (100 mg total) by mouth every 12 (twelve) hours for 7 days. Started by: Debroah Loop, PA-C   NONFORMULARY OR COMPOUNDED ITEM Trimix (30/1/10)-(Pap/Phent/PGE)  Dosage: Inject 0.2ml as directed - may increase as directed per injection   Vial 9ml  Qty #10 Refills 6  [place] [phone] Fax [phone]   oxyCODONE 5 MG immediate release tablet Commonly known as: Roxicodone Take 1 tablet (5 mg total) by mouth every 6 (six) hours as needed for severe pain.   polyethylene glycol 17 g packet Commonly known as: MIRALAX / GLYCOLAX Take 17 g by mouth daily.   pramipexole 0.5 MG tablet Commonly known as: MIRAPEX Take 2 tablets (1 mg total) by mouth at bedtime. What changed:   how much to take  when to take this  additional instructions   Rytary 23.75-95 MG Cpcr Generic drug: Carbidopa-Levodopa ER Take 4  capsules by mouth 4 (four) times daily.   vitamin B-12 1000 MCG tablet Commonly known as: CYANOCOBALAMIN Take 1,000 mcg by mouth daily.   vitamin C 500 MG tablet Commonly known as: ASCORBIC ACID Take 500 mg by mouth daily.   vitamin E 400 UNIT capsule Generic drug: vitamin E Take 400 Units by mouth daily.       Allergies:  Allergies  Allergen Reactions  . Sulfa Antibiotics Swelling    Lip swelling    Family History: Family History  Problem Relation Age of Onset  . Dementia Mother   . Cerebral aneurysm Father   . Bladder Cancer Paternal Uncle   . Liver cancer Sister   . Prostate cancer Neg Hx   . Kidney cancer Neg  Hx     Social History:   reports that he quit smoking about 50 years ago. His smoking use included cigars. He quit smokeless tobacco use about 15 years ago.  His smokeless tobacco use included chew. He reports that he does not drink alcohol or use drugs.  ROS: UROLOGY Frequent Urination?: No Hard to postpone urination?: No Burning/pain with urination?: Yes Get up at night to urinate?: Yes Leakage of urine?: Yes Urine stream starts and stops?: Yes Trouble starting stream?: Yes Do you have to strain to urinate?: Yes Blood in urine?: Yes Urinary tract infection?: Yes Sexually transmitted disease?: No Injury to kidneys or bladder?: No Painful intercourse?: No Weak stream?: Yes Erection problems?: No Penile pain?: No  Gastrointestinal Nausea?: No Vomiting?: No Indigestion/heartburn?: No Diarrhea?: No Constipation?: No  Constitutional Fever: No Night sweats?: No Weight loss?: No Fatigue?: No  Skin Skin rash/lesions?: No Itching?: No  Eyes Blurred vision?: Yes Double vision?: No  Ears/Nose/Throat Sore throat?: No Sinus problems?: No  Hematologic/Lymphatic Swollen glands?: No Easy bruising?: No  Cardiovascular Leg swelling?: No Chest pain?: No  Respiratory Cough?: No Shortness of breath?: No  Endocrine Excessive thirst?:  No  Musculoskeletal Back pain?: Yes Joint pain?: Yes  Neurological Headaches?: Yes Dizziness?: Yes  Psychologic Depression?: Yes Anxiety?: Yes  Physical Exam: BP (!) 155/88   Pulse 87   Ht 6' (1.829 m)   Wt 200 lb (90.7 kg)   BMI 27.12 kg/m   Constitutional:  Alert and oriented, no acute distress, nontoxic appearing HEENT: [place], AT Cardiovascular: No clubbing, cyanosis, or edema Respiratory: Normal respiratory effort, no increased work of breathing Skin: No rashes, bruises or suspicious lesions Neurologic: In wheelchair Psychiatric: Normal mood and affect  Laboratory Data: Results for orders placed or performed in visit on 07/12/19  Microscopic Examination   URINE  Result Value Ref Range   WBC, UA >30 (A) 0 - 5 /hpf   RBC None seen 0 - 2 /hpf   Epithelial Cells (non renal) None seen 0 - 10 /hpf   Bacteria, UA Many (A) None seen/Few  Urinalysis, Complete  Result Value Ref Range   Specific Gravity, UA 1.025 1.005 - 1.030   pH, UA 5.5 5.0 - 7.5   Color, UA Yellow Yellow   Appearance Ur Cloudy (A) Clear   Leukocytes,UA Trace (A) Negative   Protein,UA 2+ (A) Negative/Trace   Glucose, UA Negative Negative   Ketones, UA Trace (A) Negative   RBC, UA 3+ (A) Negative   Bilirubin, UA Negative Negative   Urobilinogen, Ur 1.0 0.2 - 1.0 mg/dL   Nitrite, UA Positive (A) Negative   Microscopic Examination See below:    Assessment & Plan:   78 year old male with PMH Parkinson's disease, prostate cancer s/p radical prostatectomy, and nephrolithiasis presents with a 2-day history of dysuria, frequency, urgency, and gross hematuria, now resolved. 1. Dysuria UA grossly infected today.  Hematuria resolved, likely secondary to acute cystitis.  Will send for culture and start patient on empiric Macrobid today for treatment of acute cystitis.  Counseled patient to contact the office if his gross hematuria returns or his pain does not resolve on culture appropriate antibiotics.  If he  notes return of gross hematuria following treatment with culture appropriate antibiotics, will order CT and cystoscopy for further evaluation. - Urinalysis, Complete - CULTURE, URINE COMPREHENSIVE - nitrofurantoin, macrocrystal-monohydrate, (MACROBID) 100 MG capsule; Take 1 capsule (100 mg total) by mouth every 12 (twelve) hours for 7 days.  Dispense: 14 capsule; Refill:  0  2. History of prostate cancer Overdue for annual PSA.  Obtained blood draw today. - PSA  3. History of nephrolithiasis Clinical findings more consistent with acute cystitis today in acute stone episode.  If he continues to have pain despite culture appropriate antibiotics, will reorder CTAP to evaluate for stone burden.  Return if symptoms worsen or fail to improve.  Debroah Loop, PA-C  [facility] [address] [place],  [address] [phone]

## 2019-07-12 NOTE — Telephone Encounter (Signed)
At the request of Palliative care NP, Amy, PCP contacted to request an order for Alternating Air Pad due to patient having two stage 1 pressure wounds, immobility and incontinence. Patient also requesting another pair of compression wraps. Message sent via Epic

## 2019-07-13 NOTE — Progress Notes (Signed)
[facility] Consult Note Telephone: [phone]  Fax: [phone]  PATIENT NAME: Roger Rodriguez DOB: 06/29/1941 MRN: [id number]  PRIMARY CARE PROVIDER:   [name]., MD  REFERRING PROVIDER:  Gayland Curry, [place], neurology  RESPONSIBLE PARTY:   Self H: [phone] C: [phone] Jerroll Cherry, son [phone] Kentrel Mayle, son [phone] [name], caregiver  [phone]    RECOMMENDATIONS and PLAN:  1.  Advanced care planning.  Patient is a DNR.  Went over and filled out MOST form today.  He has chosen limited hospital interventions.  States that he does not want life support or to be hooked up to machines.  He wants antibiotics as indicated and IV fluids for a defined period.  No feeding tube.  [facility]ed MOST and DNR to [facility] and left originals with patient.  2.  Parkinson's disease.  Patient is followed by Dr. Manuella Ghazi and Gayland Curry PA with neurology.  Patient is wheelchair bound and spends most of day sitting in a chair or lying in bed.  Does state that over a year ago he had surgery on his right knee and has not been able to walk since due to a serious post surgical infection.  Prior to that he states that he was able to walk with a cane or walker.  Caregiver states that over the past couple months she has noticed that his right eye seems smaller with some right sided facial drooping that seems worse on some days as compared to others.  His right pupil does seem slightly smaller than the left, both reactive to light.  No facial drooping noted today. States having more freezing in his right arm and has to use his left hand to get it going. Is having to use his left hand more for eating.  Denies headaches or dizziness.  Patient is on Rytary 61.25/245 mg QID and Rytary 23.75/95 mg QID.  Continue follow up and recommendations by neurology.    3.  Wounds.  Patient has pressure injuries to bilateral buttocks per caregiver report.   States that the skin is not broken but that the breakdown has been there for over a year.  States that they put Dermacloud cream on it everyday and try to reposition frequently.  Today are asking about an air mattress to prevent future breakdown.  Believe patient would benefit using an air mattress as he sits or lies down all day and is incontinent of bladder.  He has had these wounds for a long time though they are not worsening they are not completely healing.  An air mattress would help prevent worsening and development of new pressure injuries.  Patient also wears compression wraps that was prescribed by wound clinic.  Caregiver states that they have contacted PCP to reorder a new pair as current pair is getting worn out. Have reached out to RN clinical navigator to pursue order for air mattress and new compression wraps.  Information provided by patient is the [place] with phone number [phone].  4.  UTI.  Patient seen by urology today and is being treated for UTI.  Patient is also being seen by urology for overactive bladder and is on Myrbitriq 25 mg daily for this.  He also has a history of prostate cancer and has had his prostate removed in 2005.  Continue follow up and recommendations by urology.  Palliative will continue to monitor for symptom management/decline and make recommendations as needed.  Have next  appointment in 6 weeks  I spent 60 minutes providing this consultation,  from 2:30 to 3:30. More than 50% of the time in this consultation was spent coordinating communication.   HISTORY OF PRESENT ILLNESS:  Roger Rodriguez is a 78 y.o. year old male with multiple medical problems including Parkinson's disease, CKD, OA, HTN, depression . Palliative Care was asked to help address goals of care.   CODE STATUS: DNR  PPS: 40% HOSPICE ELIGIBILITY/DIAGNOSIS: TBD  PHYSICAL EXAM:   General: sitting in wheelchair in NAD Extremities: no edema, wearing compression wraps, no joint  deformities Neurological: Patient is wheelchair bound; has some forgetfulness   PAST MEDICAL HISTORY:  Past Medical History:  Diagnosis Date  . Allergy    allergic rhinitis  . Arthritis    OA  . Chronic kidney disease    stones  . Depression   . Family history of adverse reaction to anesthesia   . Hematuria   . History of kidney stones   . Hyperlipidemia   . Hypertension    no meds since parkinsons meds started  . Incontinence of urine   . Inguinal hernia   . Neuromuscular disorder ([place])    parkinsons  . Overactive bladder   . Parkinson's disease ([place])   . Prostate cancer ([place])   . Rhinitis, allergic   . Shortness of breath dyspnea    doe secondary to parkinsons    SOCIAL HX:  Social History   Tobacco Use  . Smoking status: Former Smoker    Types: Cigars    Quit date: 11/11/1968    Years since quitting: 50.7  . Smokeless tobacco: Former Systems developer    Types: Chew    Quit date: 11/12/2003  . Tobacco comment: Occassionally, also chewed tobacco till 2005. Quit smoking 1973  Substance Use Topics  . Alcohol use: No    Alcohol/week: 0.0 standard drinks    ALLERGIES:  Allergies  Allergen Reactions  . Sulfa Antibiotics Swelling    Lip swelling     PERTINENT MEDICATIONS:  Outpatient Encounter Medications as of 07/12/2019  Medication Sig  . aspirin EC 325 MG tablet Take 1 tablet (325 mg total) by mouth daily.  . budesonide (RHINOCORT AQUA) 32 MCG/ACT nasal spray USE 1 PUFF IN EACH NOSTRIL EVERY DAY AS NEEDED (Patient taking differently: Place 1 spray into both nostrils daily as needed for allergies. )  . celecoxib (CELEBREX) 100 MG capsule TAKE 1 CAPSULE BY MOUTH TWICE A DAY  . cholecalciferol (VITAMIN D) 1000 units tablet Take 1,000 Units by mouth daily.   . ciprofloxacin (CIPRO) 500 MG tablet Take 1 tablet (500 mg total) by mouth 2 (two) times daily. For 7 days  . docusate sodium (COLACE) 100 MG capsule Take 1 capsule (100 mg total) by mouth 2 (two) times daily. (Patient  taking differently: Take 100 mg by mouth daily as needed for mild constipation. )  . enalapril (VASOTEC) 5 MG tablet TAKE 1 TABLET BY MOUTH TWICE A DAY  . gabapentin (NEURONTIN) 400 MG capsule Take 1,200 mg by mouth 3 (three) times daily.   [name] glucose blood (QUINTET AC BLOOD GLUCOSE TEST) test strip Use as instructed  . Hypromellose (ARTIFICIAL TEARS OP) Place 1 drop into both eyes 4 (four) times daily as needed (for dry eyes).   . insulin aspart (NOVOLOG) 100 UNIT/ML injection Inject 0-15 Units into the skin 3 (three) times daily with meals.  [name] loratadine (CLARITIN) 10 MG tablet Take 10 mg by mouth daily.  . metFORMIN (  GLUCOPHAGE) 500 MG tablet Take 1 tablet (500 mg total) by mouth 2 (two) times daily with a meal.  . mirabegron ER (MYRBETRIQ) 25 MG TB24 tablet Myrbetriq 25 mg tablet,extended release  TAKE 1 TABLET (25 MG TOTAL) 2 (TWO) TIMES DAILY BY MOUTH. (Patient taking differently: Take 25 mg by mouth daily. )  . Multiple Vitamin (MULTIVITAMIN WITH MINERALS) TABS tablet Take 1 tablet by mouth daily.  . NONFORMULARY OR COMPOUNDED ITEM Trimix (30/1/10)-(Pap/Phent/PGE)  Dosage: Inject 0.2ml as directed - may increase as directed per injection   Vial 72ml  Qty #10 Refills [place] [phone] Fax [phone]  . oxyCODONE (ROXICODONE) 5 MG immediate release tablet Take 1 tablet (5 mg total) by mouth every 6 (six) hours as needed for severe pain.  . polyethylene glycol (MIRALAX / GLYCOLAX) packet Take 17 g by mouth daily.  . pramipexole (MIRAPEX) 0.5 MG tablet Take 2 tablets (1 mg total) by mouth at bedtime. (Patient taking differently: Take 0.25-0.5 mg by mouth See admin instructions. Take 0.25 mg by mouth twice daily. Take 0.5 mg by mouth at bedtime.)  . RYTARY 23.75-95 MG CPCR Take 4 capsules by mouth 4 (four) times daily.   . vitamin B-12 (CYANOCOBALAMIN) 1000 MCG tablet Take 1,000 mcg by mouth daily.  . vitamin C (ASCORBIC ACID) 500 MG tablet Take 500 mg by mouth daily.    . vitamin E (VITAMIN E) 400 UNIT capsule Take 400 Units by mouth daily.    No facility-administered encounter medications on file as of 07/12/2019.     Emiko Osorto Jenetta Downer, NP

## 2019-07-15 LAB — CULTURE, URINE COMPREHENSIVE

## 2019-07-17 ENCOUNTER — Telehealth: Payer: Self-pay | Admitting: Physician Assistant

## 2019-07-17 ENCOUNTER — Other Ambulatory Visit: Payer: Self-pay | Admitting: Physician Assistant

## 2019-07-17 LAB — PSA: Prostate Specific Ag, Serum: <0.1 ng/mL (ref 0.0–4.0)

## 2019-07-17 MED ORDER — AMOXICILLIN-POT CLAVULANATE 875-125 MG PO TABS: 1.0000 | ORAL_TABLET | Freq: Two times a day (BID) | ORAL | 0 refills | Status: AC

## 2019-07-17 NOTE — Telephone Encounter (Signed)
Pt called office asking about urine culture results from last week.  He has been taking meds for 4 days and isn't feeling any better.

## 2019-07-17 NOTE — Telephone Encounter (Signed)
-----   Message from [name]., MD sent at 07/13/2019 11:33 AM EST ----- Regarding: FW: DME for Palliative Care patient Okay to put in this order as below.  Thank you ----- Message ----- From: Cornelius Moras, RN Sent: 07/12/2019   3:48 PM EST To: [name]., MD, Benedetto Goad, RN Subject: DME for Palliative Care patient                 Good Afternoon,     I am the clinical navigator with Palliative Care. Our Palliative Care NP, Amy,  had a visit today with Mr. Fistler. Amy wanted me to reach out to see if an order for an Alternating Air Pad and a pair of compression stockings could be sent to a DME Agency.  Patient has 2 stage 1 wounds on his buttocks. He is immobile, unable to turn/reposition self in bed and is incontinent. I have the fax number to [facility], if that is helpful. It is [phone]. Thank you for your time.

## 2019-07-17 NOTE — Telephone Encounter (Signed)
I just spoke with the patient via telephone.  I explained that his urine culture results indicate that I need to adjust his antibiotic therapy.  Augmentin twice daily x7 days sent to his pharmacy.  I advised him to start this as soon as possible and contact the office if he does not feel better after several days of culture appropriate antibiotics.  I also informed him that his PSA resulted undetectable.  He expressed understanding.

## 2019-07-17 NOTE — Telephone Encounter (Signed)
A written order has been placed for the patient and faxed to [facility] at [phone].

## 2019-07-20 DIAGNOSIS — G20 Parkinson's disease: Secondary | ICD-10-CM | POA: Diagnosis not present

## 2019-07-20 DIAGNOSIS — M6281 Muscle weakness (generalized): Secondary | ICD-10-CM | POA: Diagnosis not present

## 2019-07-20 DIAGNOSIS — R262 Difficulty in walking, not elsewhere classified: Secondary | ICD-10-CM | POA: Diagnosis not present

## 2019-07-20 DIAGNOSIS — M9711XS Periprosthetic fracture around internal prosthetic right knee joint, sequela: Secondary | ICD-10-CM | POA: Diagnosis not present

## 2019-07-24 ENCOUNTER — Telehealth: Payer: Self-pay | Admitting: Urology

## 2019-07-24 NOTE — Telephone Encounter (Signed)
Pt called and states that he has just completed his last dose of ABX on 07/23/19 and is still having burning with urination. He refused to go to [facility] today. He will come in tomorrow at 2:15pm to see Larene Beach, per Judson Roch.

## 2019-07-24 NOTE — Progress Notes (Deleted)
07/25/2019 9:12 PM   [name] 04-18-1941 [id number]  CC: Dysuria, urgency, frequency, low back pain, gross hematuria  HPI: Roger Rodriguez is a 79 y.o. male who presents today for evaluation of possible UTI. He is an established [facility] patient with PMH prostate cancer s/p radical prostatectomy, SUI, OAB, microscopic hematuria, and nephrolithiasis who presents today for dysuria.    Prostate cancer RRP in 2006 with Dr. Quillian Quince.  Pathology not available.  PSA <0.1 06/2019.    SUI ***  OAB Found Myrbetriq cost prohibitive.  Did not continue PTNS.    Dysuria He was seen by my colleague Sam Vaillancort, PA-C on 07/12/2019 for symptoms of dysuria, urgency, frequency, low back pain, and nausea associated with some bloody seepage in his absorbent diaper which had resolved.   He was started on Macrobid but subsequently changed to Augmentin when urine culture results become available.  Urine culture was positive for Klebsiella pneumoniae.     PMH: Past Medical History:  Diagnosis Date  . Allergy    allergic rhinitis  . Arthritis    OA  . Chronic kidney disease    stones  . Depression   . Family history of adverse reaction to anesthesia   . Hematuria   . History of kidney stones   . Hyperlipidemia   . Hypertension    no meds since parkinsons meds started  . Incontinence of urine   . Inguinal hernia   . Neuromuscular disorder ([place])    parkinsons  . Overactive bladder   . Parkinson's disease ([place])   . Prostate cancer ([place])   . Rhinitis, allergic   . Shortness of breath dyspnea    doe secondary to parkinsons    Surgical History: Past Surgical History:  Procedure Laterality Date  . BACK SURGERY  2014  . CYSTOSCOPY WITH STENT PLACEMENT Right 01/13/2016   Procedure: CYSTOSCOPY WITH STENT PLACEMENT;  Surgeon: Hollice Espy, MD;  Location: [facility];  Service: Urology;  Laterality: Right;  . CYSTOSCOPY/URETEROSCOPY/HOLMIUM LASER/STENT PLACEMENT Left 11/20/2015   Procedure:  CYSTOSCOPY/URETEROSCOPY/HOLMIUM LASER/STENT PLACEMENT;  Surgeon: Hollice Espy, MD;  Location: [facility];  Service: Urology;  Laterality: Left;  . EYE SURGERY    . FRACTURE SURGERY      ankle   . H/O arthroscopy of right knee  2015  . H/O radical protatectomy    . HERNIA REPAIR    . JOINT REPLACEMENT     right knee  . LITHOTRIPSY  2010  . Rotator Cuff tear  1993   Repaired  . TOTAL KNEE REVISION Right 03/17/2018   Procedure: TOTAL KNEE REVISION;  Surgeon: Lovell Sheehan, MD;  Location: [facility];  Service: Orthopedics;  Laterality: Right;  . TOTAL KNEE REVISION Right 06/08/2018   Procedure: TOTAL KNEE REVISION;  Surgeon: Lovell Sheehan, MD;  Location: [facility];  Service: Orthopedics;  Laterality: Right;  . URETEROSCOPY WITH HOLMIUM LASER LITHOTRIPSY  2002?  [name] URETEROSCOPY WITH HOLMIUM LASER LITHOTRIPSY Right 01/13/2016   Procedure: URETEROSCOPY WITH HOLMIUM LASER LITHOTRIPSY;  Surgeon: Hollice Espy, MD;  Location: [facility];  Service: Urology;  Laterality: Right;  [name] VASECTOMY     History    Home Medications:  Allergies as of 07/25/2019      Reactions   Sulfa Antibiotics Swelling   Lip swelling      Medication List       Accurate as of July 24, 2019  9:12 PM. If you have any questions, ask your nurse or doctor.  ARTIFICIAL TEARS OP Place 1 drop into both eyes 4 (four) times daily as needed (for dry eyes).   aspirin EC 325 MG tablet Take 1 tablet (325 mg total) by mouth daily.   budesonide 32 MCG/ACT nasal spray Commonly known as: RHINOCORT AQUA USE 1 PUFF IN EACH NOSTRIL EVERY DAY AS NEEDED What changed: See the new instructions.   celecoxib 100 MG capsule Commonly known as: CELEBREX TAKE 1 CAPSULE BY MOUTH TWICE A DAY   cholecalciferol 1000 units tablet Commonly known as: VITAMIN D Take 1,000 Units by mouth daily.   ciprofloxacin 500 MG tablet Commonly known as: Cipro Take 1 tablet (500 mg total) by mouth 2 (two) times daily. For 7 days   docusate  sodium 100 MG capsule Commonly known as: COLACE Take 1 capsule (100 mg total) by mouth 2 (two) times daily. What changed:   when to take this  reasons to take this   enalapril 5 MG tablet Commonly known as: VASOTEC TAKE 1 TABLET BY MOUTH TWICE A DAY   gabapentin 400 MG capsule Commonly known as: NEURONTIN Take 1,200 mg by mouth 3 (three) times daily.   glucose blood test strip Commonly known as: Quintet AC Blood Glucose Test Use as instructed   insulin aspart 100 UNIT/ML injection Commonly known as: novoLOG Inject 0-15 Units into the skin 3 (three) times daily with meals.   loratadine 10 MG tablet Commonly known as: CLARITIN Take 10 mg by mouth daily.   metFORMIN 500 MG tablet Commonly known as: GLUCOPHAGE Take 1 tablet (500 mg total) by mouth 2 (two) times daily with a meal.   mirabegron ER 25 MG Tb24 tablet Commonly known as: Myrbetriq Myrbetriq 25 mg tablet,extended release  TAKE 1 TABLET (25 MG TOTAL) 2 (TWO) TIMES DAILY BY MOUTH. What changed:   how much to take  how to take this  when to take this  additional instructions   multivitamin with minerals Tabs tablet Take 1 tablet by mouth daily.   NONFORMULARY OR COMPOUNDED ITEM Trimix (30/1/10)-(Pap/Phent/PGE)  Dosage: Inject 0.42ml as directed - may increase as directed per injection   Vial 72ml  Qty #10 Refills 6  [place] [phone] Fax [phone]   oxyCODONE 5 MG immediate release tablet Commonly known as: Roxicodone Take 1 tablet (5 mg total) by mouth every 6 (six) hours as needed for severe pain.   polyethylene glycol 17 g packet Commonly known as: MIRALAX / GLYCOLAX Take 17 g by mouth daily.   pramipexole 0.5 MG tablet Commonly known as: MIRAPEX Take 2 tablets (1 mg total) by mouth at bedtime. What changed:   how much to take  when to take this  additional instructions   Rytary 23.75-95 MG Cpcr Generic drug: Carbidopa-Levodopa ER Take 4 capsules by mouth 4  (four) times daily.   vitamin B-12 1000 MCG tablet Commonly known as: CYANOCOBALAMIN Take 1,000 mcg by mouth daily.   vitamin C 500 MG tablet Commonly known as: ASCORBIC ACID Take 500 mg by mouth daily.   vitamin E 180 MG (400 UNITS) capsule Generic drug: vitamin E Take 400 Units by mouth daily.       Allergies:  Allergies  Allergen Reactions  . Sulfa Antibiotics Swelling    Lip swelling    Family History: Family History  Problem Relation Age of Onset  . Dementia Mother   . Cerebral aneurysm Father   . Bladder Cancer Paternal Uncle   . Liver cancer Sister   . Prostate cancer Neg  Hx   . Kidney cancer Neg Hx     Social History:   reports that he quit smoking about 50 years ago. His smoking use included cigars. He quit smokeless tobacco use about 15 years ago.  His smokeless tobacco use included chew. He reports that he does not drink alcohol or use drugs.  ROS:                                        Physical Exam: There were no vitals taken for this visit.  Constitutional:  Well nourished. Alert and oriented, No acute distress. HEENT: [place] AT, moist mucus membranes.  Trachea midline, no masses. Cardiovascular: No clubbing, cyanosis, or edema. Respiratory: Normal respiratory effort, no increased work of breathing. GI: Abdomen is soft, non tender, non distended, no abdominal masses. Liver and spleen not palpable.  No hernias appreciated.  Stool sample for occult testing is not indicated.   GU: No CVA tenderness.  No bladder fullness or masses.  Patient with circumcised/uncircumcised phallus. ***Foreskin easily retracted***  Urethral meatus is patent.  No penile discharge. No penile lesions or rashes. Scrotum without lesions, cysts, rashes and/or edema.  Testicles are located scrotally bilaterally. No masses are appreciated in the testicles. Left and right epididymis are normal. Rectal: Patient with  normal sphincter tone. Anus and perineum without  scarring or rashes. No rectal masses are appreciated. Prostate is approximately *** grams, *** nodules are appreciated. Seminal vesicles are normal. Skin: No rashes, bruises or suspicious lesions. Lymph: No cervical or inguinal adenopathy. Neurologic: Grossly intact, no focal deficits, moving all 4 extremities. Psychiatric: Normal mood and affect.  Laboratory Data: Results for orders placed or performed in visit on 07/12/19  CULTURE, URINE COMPREHENSIVE   Specimen: Urine   UR  Result Value Ref Range   Urine Culture, Comprehensive Final report (A)    Organism ID, Bacteria Klebsiella pneumoniae (A)    ANTIMICROBIAL SUSCEPTIBILITY Comment   Microscopic Examination   URINE  Result Value Ref Range   WBC, UA >30 (A) 0 - 5 /hpf   RBC None seen 0 - 2 /hpf   Epithelial Cells (non renal) None seen 0 - 10 /hpf   Bacteria, UA Many (A) None seen/Few  Urinalysis, Complete  Result Value Ref Range   Specific Gravity, UA 1.025 1.005 - 1.030   pH, UA 5.5 5.0 - 7.5   Color, UA Yellow Yellow   Appearance Ur Cloudy (A) Clear   Leukocytes,UA Trace (A) Negative   Protein,UA 2+ (A) Negative/Trace   Glucose, UA Negative Negative   Ketones, UA Trace (A) Negative   RBC, UA 3+ (A) Negative   Bilirubin, UA Negative Negative   Urobilinogen, Ur 1.0 0.2 - 1.0 mg/dL   Nitrite, UA Positive (A) Negative   Microscopic Examination See below:   PSA  Result Value Ref Range   Prostate Specific Ag, Serum <0.1 0.0 - 4.0 ng/mL   Assessment & Plan:    1. Dysuria - Urinalysis, Complete - CULTURE, URINE COMPREHENSIVE ***  2. History of prostate cancer - PSA undetectable   3. History of nephrolithiasis Clinical findings more consistent with acute cystitis today in acute stone episode.  If he continues to have pain despite culture appropriate antibiotics, will reorder CTAP to evaluate for stone burden.  No follow-ups on file.  Zara Council, [facility] [address],  [place],  [place] [address] [phone]

## 2019-07-25 ENCOUNTER — Telehealth: Payer: Self-pay | Admitting: Family Medicine

## 2019-07-25 ENCOUNTER — Ambulatory Visit: Payer: Self-pay | Admitting: Urology

## 2019-07-25 NOTE — Telephone Encounter (Signed)
Spoke to patient and he states he is still having a little dysuria. He states he has had mild diarrhea. I informed him that the ABX he has been on can cause mild stomach discomfort. He states he has finished his ABX on Sunday. An appointment has been scheduled for tomorrow.

## 2019-07-26 ENCOUNTER — Ambulatory Visit: Payer: Self-pay | Admitting: Urology

## 2019-07-27 ENCOUNTER — Telehealth: Payer: Self-pay | Admitting: Adult Health Nurse Practitioner

## 2019-07-27 ENCOUNTER — Telehealth: Payer: Self-pay

## 2019-07-27 NOTE — Telephone Encounter (Signed)
Phone call placed to patient. Left VM providing update on request for APP and Compression wraps.

## 2019-07-27 NOTE — Telephone Encounter (Signed)
Patient had left VM about update on order request for air mattress and compression wraps.  Updated him that the order was requested and that we have not heard anything back at this time and we will call for an update and that we will update him as soon as we hear anything. Roger Dulski K. Olena Heckle NP

## 2019-07-27 NOTE — Telephone Encounter (Signed)
Follow up communication to PCP office regarding request for APP and for Compression wraps. Per Helene Kelp at PCP office, the request for these items are being worked on.

## 2019-08-01 ENCOUNTER — Telehealth: Payer: Self-pay

## 2019-08-01 ENCOUNTER — Telehealth: Payer: Self-pay | Admitting: Adult Health Nurse Practitioner

## 2019-08-01 NOTE — Telephone Encounter (Signed)
Message sent to follow up regarding Air mattress and compression wraps.

## 2019-08-01 NOTE — Telephone Encounter (Signed)
Returned patient's call to update on request for air mattress and compression wraps.  Let him know that request was given to PCP and that the office is working on it at last update. Aarion Kittrell K. Olena Heckle NP

## 2019-08-04 ENCOUNTER — Other Ambulatory Visit: Payer: Self-pay | Admitting: Family Medicine

## 2019-08-04 DIAGNOSIS — E119 Type 2 diabetes mellitus without complications: Secondary | ICD-10-CM

## 2019-08-04 NOTE — Telephone Encounter (Signed)
Requested medication (s) are due for refill today: no  Requested medication (s) are on the active medication list: yes  Last refill: 05/10/2019  Future visit scheduled:no  Notes to clinic:  overdue for office visit and labs   Requested Prescriptions  Pending Prescriptions Disp Refills   metFORMIN (GLUCOPHAGE) 500 MG tablet [Pharmacy Med Name: METFORMIN HCL 500 MG TABLET] 180 tablet 3    Sig: Take 1 tablet (500 mg total) by mouth 2 (two) times daily with a meal.      Endocrinology:  Diabetes - Biguanides Failed - 08/04/2019  3:31 AM      Failed - HBA1C is between 0 and 7.9 and within 180 days    Hgb A1c MFr Bld  Date Value Ref Range Status  06/02/2018 7.3 (H) 4.8 - 5.6 % Final    Comment:             Prediabetes: 5.7 - 6.4          Diabetes: >6.4          Glycemic control for adults with diabetes: <7.0           Failed - Valid encounter within last 6 months    Recent Outpatient Visits           11 months ago Diabetes mellitus without complication [facility])   [facility] [name]., MD   1 year ago Pre-op examination   [facility] [name]., MD   1 year ago Encounter for annual physical exam   [facility] [name]., MD   1 year ago Right knee pain, unspecified chronicity   [facility] [name]., MD   1 year ago Essential (primary) hypertension   [facility] [name]., MD              Passed - Cr in normal range and within 360 days    Creatinine  Date Value Ref Range Status  04/06/2014 0.94 0.60 - 1.30 mg/dL Final   Creatinine, Ser  Date Value Ref Range Status  08/15/2018 1.05 0.76 - 1.27 mg/dL Final          Passed - eGFR in normal range and within 360 days    EGFR (African American)  Date Value Ref Range Status  04/06/2014 >60 >36m/min Final  04/04/2014 >60  Final   GFR calc Af Amer  Date Value Ref Range Status   08/15/2018 78 >59 mL/min/1.73 Final   EGFR (Non-African Amer.)  Date Value Ref Range Status  04/06/2014 >60 >684mmin Final    Comment:    eGFR values <6071min/1.73 m2 may be an indication of chronic kidney disease (CKD). Calculated eGFR, using the MRDR Study equation, is useful in  patients with stable renal function. The eGFR calculation will not be reliable in acutely ill patients when serum creatinine is changing rapidly. It is not useful in patients on dialysis. The eGFR calculation may not be applicable to patients at the low and high extremes of body sizes, pregnant women, and vegetarians.   04/04/2014 54 (L)  Final    Comment:    eGFR values <58m57mn/1.73 m2 may be an indication of chronic kidney disease (CKD). Calculated eGFR is useful in patients with stable renal function. The eGFR calculation will not be reliable in acutely ill patients when serum creatinine is changing rapidly. It is not useful in  patients on dialysis. The eGFR calculation may not  be applicable to patients at the low and high extremes of body sizes, pregnant women, and vegetarians.    GFR calc non Af Amer  Date Value Ref Range Status  08/15/2018 68 >59 mL/min/1.73 Final

## 2019-08-09 ENCOUNTER — Telehealth: Payer: Self-pay | Admitting: Urology

## 2019-08-09 NOTE — Telephone Encounter (Signed)
Pt's son, [name], has some questions before pt's appt w/Shannon tomorrow.  Please give him a call [phone]

## 2019-08-09 NOTE — Telephone Encounter (Signed)
Talked to patient's son and he wants to know if he can pick up a cup and drop it off tomorrow instead of patient coming in to office and possible doing a virtual visit. Per Larene Beach "we need to do more than look at urine. he needs a bladder scan and possibly an xray to figure out why he is still having symptoms". Travis informed. Patient will be in office tomorrow.

## 2019-08-10 ENCOUNTER — Ambulatory Visit
Admission: RE | Admit: 2019-08-10 | Discharge: 2019-08-10 | Disposition: A | Discharge status: Home / Self Care | Payer: PPO | Source: Ambulatory Visit | Attending: Urology | Admitting: Urology

## 2019-08-10 ENCOUNTER — Other Ambulatory Visit: Payer: Self-pay

## 2019-08-10 ENCOUNTER — Encounter: Payer: Self-pay | Admitting: Urology

## 2019-08-10 ENCOUNTER — Ambulatory Visit (INDEPENDENT_AMBULATORY_CARE_PROVIDER_SITE_OTHER): Payer: PPO | Admitting: Urology

## 2019-08-10 VITALS — BP 120/71 | HR 86 | Ht 72.0 in | Wt 210.0 lb

## 2019-08-10 DIAGNOSIS — R339 Retention of urine, unspecified: Secondary | ICD-10-CM | POA: Diagnosis not present

## 2019-08-10 DIAGNOSIS — R300 Dysuria: Secondary | ICD-10-CM

## 2019-08-10 DIAGNOSIS — R310 Gross hematuria: Secondary | ICD-10-CM | POA: Diagnosis not present

## 2019-08-10 DIAGNOSIS — N200 Calculus of kidney: Secondary | ICD-10-CM | POA: Diagnosis not present

## 2019-08-10 LAB — BLADDER SCAN AMB NON-IMAGING: Scan Result: 443

## 2019-08-10 MED ORDER — CEPHALEXIN 500 MG PO CAPS: 500.0000 mg | ORAL_CAPSULE | Freq: Four times a day (QID) | ORAL | 0 refills | Status: AC

## 2019-08-10 NOTE — Progress Notes (Signed)
Spoke with [place] at [facility] [phone] to set up [place] monthly cath exchanges. She states she will be in contact with the patient soon

## 2019-08-10 NOTE — Progress Notes (Signed)
08/10/2019 2:52 PM   Roger Rodriguez 09-22-1940 [id number]  Referring provider: [name]., MD [address] [place],  [place] [address]  Chief Complaint  Patient presents with  . Dysuria    HPI: Roger Rodriguez is a 79 year old male with Parkinson's disease a history of prostate cancer (s/p radical prostatectomy), SUI, OAB and nephrolithiasis who presents today with the complaint of persistent dysuria with caregiver, [name].    He was last seen in our office with Thomas Hoff, PA-C for a Klebsiella pneumoniae UTI which was treated with culture appropriate antibiotics.    He states he continues to have dysuria, urgency, incontinence, intermittency, hesitancy, straining to urinate, gross hematuria and a weak urinary stream.  Patient denies any modifying or aggravating factors.  Patient denies any suprapubic/flank pain.  Patient denies any fevers, chills, nausea or vomiting.  His UA yellow cloudy, 1+ blood, 2 + protein, 3 + leukocytes with > 30 WBC's and many bacteria.  His PVR with 443 mL.    He is also having back pain and is worried about having stones.  Recent PSA <0.1 ng/mL in 06/2019.    KUB 07/31/2019 Scattered small renal calculi bilaterally. No definitive ureteral stones are noted.  PMH: Past Medical History:  Diagnosis Date  . Allergy    allergic rhinitis  . Arthritis    OA  . Chronic kidney disease    stones  . Depression   . Family history of adverse reaction to anesthesia   . Hematuria   . History of kidney stones   . Hyperlipidemia   . Hypertension    no meds since parkinsons meds started  . Incontinence of urine   . Inguinal hernia   . Neuromuscular disorder ([place])    parkinsons  . Overactive bladder   . Parkinson's disease ([place])   . Prostate cancer ([place])   . Rhinitis, allergic   . Shortness of breath dyspnea    doe secondary to parkinsons    Surgical History: Past Surgical History:  Procedure Laterality Date  . BACK SURGERY   2014  . CYSTOSCOPY WITH STENT PLACEMENT Right 01/13/2016   Procedure: CYSTOSCOPY WITH STENT PLACEMENT;  Surgeon: Hollice Espy, MD;  Location: [facility];  Service: Urology;  Laterality: Right;  . CYSTOSCOPY/URETEROSCOPY/HOLMIUM LASER/STENT PLACEMENT Left 11/20/2015   Procedure: CYSTOSCOPY/URETEROSCOPY/HOLMIUM LASER/STENT PLACEMENT;  Surgeon: Hollice Espy, MD;  Location: [facility];  Service: Urology;  Laterality: Left;  . EYE SURGERY    . FRACTURE SURGERY      ankle   . H/O arthroscopy of right knee  2015  . H/O radical protatectomy    . HERNIA REPAIR    . JOINT REPLACEMENT     right knee  . LITHOTRIPSY  2010  . Rotator Cuff tear  1993   Repaired  . TOTAL KNEE REVISION Right 03/17/2018   Procedure: TOTAL KNEE REVISION;  Surgeon: Lovell Sheehan, MD;  Location: [facility];  Service: Orthopedics;  Laterality: Right;  . TOTAL KNEE REVISION Right 06/08/2018   Procedure: TOTAL KNEE REVISION;  Surgeon: Lovell Sheehan, MD;  Location: [facility];  Service: Orthopedics;  Laterality: Right;  . URETEROSCOPY WITH HOLMIUM LASER LITHOTRIPSY  2002?  [name] URETEROSCOPY WITH HOLMIUM LASER LITHOTRIPSY Right 01/13/2016   Procedure: URETEROSCOPY WITH HOLMIUM LASER LITHOTRIPSY;  Surgeon: Hollice Espy, MD;  Location: [facility];  Service: Urology;  Laterality: Right;  [name] VASECTOMY     History    Home Medications:  Allergies as of  08/10/2019      Reactions   Sulfa Antibiotics Swelling   Lip swelling      Medication List       Accurate as of August 10, 2019  2:52 PM. If you have any questions, ask your nurse or doctor.        STOP taking these medications   ciprofloxacin 500 MG tablet Commonly known as: Cipro Stopped by: Dilpreet Faires, PA-C     TAKE these medications   ARTIFICIAL TEARS OP Place 1 drop into both eyes 4 (four) times daily as needed (for dry eyes).   aspirin EC 325 MG tablet Take 1 tablet (325 mg total) by mouth daily.   budesonide 32 MCG/ACT nasal spray Commonly known as: RHINOCORT  AQUA USE 1 PUFF IN EACH NOSTRIL EVERY DAY AS NEEDED What changed: See the new instructions.   celecoxib 100 MG capsule Commonly known as: CELEBREX TAKE 1 CAPSULE BY MOUTH TWICE A DAY   cephALEXin 500 MG capsule Commonly known as: KEFLEX Take 1 capsule (500 mg total) by mouth 4 (four) times daily for 10 days. Started by: Zara Council, PA-C   cholecalciferol 1000 units tablet Commonly known as: VITAMIN D Take 1,000 Units by mouth daily.   docusate sodium 100 MG capsule Commonly known as: COLACE Take 1 capsule (100 mg total) by mouth 2 (two) times daily. What changed:   when to take this  reasons to take this   enalapril 5 MG tablet Commonly known as: VASOTEC TAKE 1 TABLET BY MOUTH TWICE A DAY   gabapentin 400 MG capsule Commonly known as: NEURONTIN Take 1,200 mg by mouth 3 (three) times daily.   glucose blood test strip Commonly known as: Quintet AC Blood Glucose Test Use as instructed   insulin aspart 100 UNIT/ML injection Commonly known as: novoLOG Inject 0-15 Units into the skin 3 (three) times daily with meals.   loratadine 10 MG tablet Commonly known as: CLARITIN Take 10 mg by mouth daily.   metFORMIN 500 MG tablet Commonly known as: GLUCOPHAGE TAKE 1 TABLET (500 MG TOTAL) BY MOUTH 2 (TWO) TIMES DAILY WITH A MEAL.   mirabegron ER 25 MG Tb24 tablet Commonly known as: Myrbetriq Myrbetriq 25 mg tablet,extended release  TAKE 1 TABLET (25 MG TOTAL) 2 (TWO) TIMES DAILY BY MOUTH. What changed:   how much to take  how to take this  when to take this  additional instructions   multivitamin with minerals Tabs tablet Take 1 tablet by mouth daily.   NONFORMULARY OR COMPOUNDED ITEM Trimix (30/1/10)-(Pap/Phent/PGE)  Dosage: Inject 0.6ml as directed - may increase as directed per injection   Vial 92ml  Qty #10 Refills 6  [place] [phone] Fax [phone]   oxyCODONE 5 MG immediate release tablet Commonly known as:  Roxicodone Take 1 tablet (5 mg total) by mouth every 6 (six) hours as needed for severe pain.   polyethylene glycol 17 g packet Commonly known as: MIRALAX / GLYCOLAX Take 17 g by mouth daily.   pramipexole 0.5 MG tablet Commonly known as: MIRAPEX Take 2 tablets (1 mg total) by mouth at bedtime. What changed:   how much to take  when to take this  additional instructions   Rytary 23.75-95 MG Cpcr Generic drug: Carbidopa-Levodopa ER Take 4 capsules by mouth 4 (four) times daily.   vitamin B-12 1000 MCG tablet Commonly known as: CYANOCOBALAMIN Take 1,000 mcg by mouth daily.   vitamin C 500 MG tablet Commonly known as: ASCORBIC ACID Take  500 mg by mouth daily.   vitamin E 180 MG (400 UNITS) capsule Generic drug: vitamin E Take 400 Units by mouth daily.       Allergies:  Allergies  Allergen Reactions  . Sulfa Antibiotics Swelling    Lip swelling    Family History: Family History  Problem Relation Age of Onset  . Dementia Mother   . Cerebral aneurysm Father   . Bladder Cancer Paternal Uncle   . Liver cancer Sister   . Prostate cancer Neg Hx   . Kidney cancer Neg Hx     Social History:  reports that he quit smoking about 50 years ago. His smoking use included cigars. He quit smokeless tobacco use about 15 years ago.  His smokeless tobacco use included chew. He reports that he does not drink alcohol or use drugs.  ROS: UROLOGY Frequent Urination?: No Hard to postpone urination?: Yes Burning/pain with urination?: Yes Get up at night to urinate?: No Leakage of urine?: Yes Urine stream starts and stops?: Yes Trouble starting stream?: Yes Do you have to strain to urinate?: Yes Blood in urine?: Yes Urinary tract infection?: Yes Sexually transmitted disease?: No Injury to kidneys or bladder?: No Painful intercourse?: No Weak stream?: Yes Erection problems?: No Penile pain?: Yes  Gastrointestinal Nausea?: No Vomiting?: No Indigestion/heartburn?:  Yes Diarrhea?: No Constipation?: Yes  Constitutional Fever: No Night sweats?: No Weight loss?: No Fatigue?: Yes  Skin Skin rash/lesions?: No Itching?: No  Eyes Blurred vision?: Yes Double vision?: No  Ears/Nose/Throat Sore throat?: No Sinus problems?: Yes  Hematologic/Lymphatic Swollen glands?: No Easy bruising?: No  Cardiovascular Leg swelling?: Yes Chest pain?: No  Respiratory Cough?: No Shortness of breath?: Yes  Endocrine Excessive thirst?: Yes  Musculoskeletal Back pain?: Yes Joint pain?: Yes  Neurological Headaches?: Yes Dizziness?: Yes  Psychologic Depression?: Yes Anxiety?: No  Physical Exam: BP 120/71   Pulse 86   Ht 6' (1.829 m)   Wt 210 lb (95.3 kg)   BMI 28.48 kg/m   Constitutional:  Well nourished. Alert and oriented, No acute distress. HEENT: [place] AT, mask in place.  Trachea midline, no masses. Cardiovascular: No clubbing, cyanosis, or edema. Respiratory: Normal respiratory effort, no increased work of breathing. GI: Abdomen is soft, non tender, non distended, no abdominal masses. Liver and spleen not palpable.  No hernias appreciated.  Stool sample for occult testing is not indicated.   GU: No CVA tenderness.  No bladder fullness or masses.  Patient with uncircumcised phallus. Foreskin easily retracted  Urethral meatus is patent.  No penile discharge. No penile lesions or rashes. Scrotum without lesions, cysts, rashes and/or edema.   Skin: No rashes, bruises or suspicious lesions. Lymph: No inguinal adenopathy. Neurologic: Grossly intact, no focal deficits, moving all 4 extremities. Psychiatric: Normal mood and affect.  Laboratory Data: Lab Results  Component Value Date   WBC 5.4 08/15/2018   HGB 13.5 08/15/2018   HCT 38.8 08/15/2018   MCV 88 08/15/2018   PLT 228 08/15/2018    Lab Results  Component Value Date   CREATININE 1.05 08/15/2018    Lab Results  Component Value Date   PSA <0.1 09/11/2010    No results found  for: TESTOSTERONE  Lab Results  Component Value Date   HGBA1C 7.3 (H) 06/02/2018    Lab Results  Component Value Date   TSH 2.500 08/15/2018       Component Value Date/Time   CHOL 223 (H) 08/15/2018 1142   HDL 33 (L) 08/15/2018 1142  CHOLHDL 5.1 (H) 03/02/2018 1338   LDLCALC 138 (H) 08/15/2018 1142    Lab Results  Component Value Date   AST 21 08/15/2018   Lab Results  Component Value Date   ALT 12 08/15/2018   No components found for: ALKALINEPHOPHATASE No components found for: BILIRUBINTOTAL  No results found for: ESTRADIOL  Urinalysis Component     Latest Ref Rng & Units 08/10/2019  Specific Gravity, UA     1.005 - 1.030 1.025  pH, UA     5.0 - 7.5 6.0  Color, UA     Yellow Yellow  Appearance Ur     Clear Cloudy (A)  Leukocytes,UA     Negative 3+ (A)  Protein,UA     Negative/Trace 2+ (A)  Glucose, UA     Negative Negative  Ketones, UA     Negative Negative  RBC, UA     Negative 1+ (A)  Bilirubin, UA     Negative Negative  Urobilinogen, Ur     0.2 - 1.0 mg/dL 1.0  Nitrite, UA     Negative Negative  Microscopic Examination      See below:   Component     Latest Ref Rng & Units 08/10/2019  WBC, UA     0 - 5 /hpf >30 (A)  RBC     0 - 2 /hpf None seen  Epithelial Cells (non renal)     0 - 10 /hpf 0-10  Bacteria, UA     None seen/Few Many (A)    I have reviewed the labs.   Pertinent Imaging: Results for ALIK, HEINKE" (MRN [id number]) as of 08/10/2019 11:27  Ref. Range 08/10/2019 10:41  Scan Result Unknown [id number]  CLINICAL DATA:  Hematuria and flank pain, initial encounter  EXAM: ABDOMEN - 1 VIEW  COMPARISON:  09/23/2016  FINDINGS: Scattered large and small bowel gas is noted. Multiple phleboliths are again seen within the pelvis. Vascular calcifications are noted as well. Degenerative changes of lumbar spine are noted. Scattered small renal calculi are seen bilaterally measuring 3-4 mm. No other focal abnormality is  noted.  IMPRESSION: Scattered small renal calculi bilaterally. No definitive ureteral stones are noted.   Electronically Signed   By: Inez Catalina M.D.   On: 08/10/2019 11:44 I have independently reviewed the films and note the punctate renal stones bilaterally.   Simple Catheter Placement Due to urinary retention patient is present today for a foley cath placement.  Patient was cleaned and prepped in a sterile fashion with betadine. A 16 FR foley catheter was inserted, urine return was noted  400 ml, urine was yellow cloudy in color.  The balloon was filled with 10cc of sterile water.  An overnight bag was attached for drainage. Patient was also given a night bag to take home and was given instruction on how to change from one bag to another.  Patient was given instruction on proper catheter care.  Patient tolerated well, no complications were noted   Performed by: Zara Council, PA-C   Assessment & Plan:    1. Dysuria - Secondary to UTI and retention - Urinalysis, Complete - Bladder Scan (Post Void Residual) in office  2. Gross hematuria - Likely due to UTI - DG Abd 1 View; Future - no bladder, ureteral or large renal stones seen  3. Urinary retention - Foley catheter placed today - will arrange for monthly catheter exchange either through our office or home health    Return in about  1 month (around 09/10/2019) for Foley catheter exchange .  These notes generated with voice recognition software. I apologize for typographical errors.  Zara Council, PA-C  [facility] [address]  [place], [place] [address] [phone]

## 2019-08-11 LAB — URINALYSIS, COMPLETE
Bilirubin, UA: NEGATIVE
Glucose, UA: NEGATIVE
Ketones, UA: NEGATIVE
Nitrite, UA: NEGATIVE
Specific Gravity, UA: 1.025 (ref 1.005–1.030)
Urobilinogen, Ur: 1 mg/dL (ref 0.2–1.0)
pH, UA: 6 (ref 5.0–7.5)

## 2019-08-11 LAB — MICROSCOPIC EXAMINATION
RBC, Urine: NONE SEEN /hpf (ref 0–2)
WBC, UA: >30 /hpf — AB (ref 0–5)

## 2019-08-14 ENCOUNTER — Telehealth: Payer: Self-pay | Admitting: Family Medicine

## 2019-08-14 LAB — CULTURE, URINE COMPREHENSIVE

## 2019-08-14 NOTE — Telephone Encounter (Signed)
-----   Message from Nori Riis, PA-C sent at 08/14/2019  8:08 AM EST ----- Please let Roger Rodriguez and his son know that his urine culture is positive and the Keflex is the correct antibiotic.

## 2019-08-14 NOTE — Telephone Encounter (Signed)
Patient notified and voiced understanding.

## 2019-08-15 ENCOUNTER — Telehealth: Payer: Self-pay

## 2019-08-15 NOTE — Telephone Encounter (Signed)
At the request of Amy NP, phone call returned to patient. Patient shared that he had a catheter placed and wanted to ensure he had a way to have it changed. Reviewed documentation and noted that [facility] had been contacted to provide monthly catheter changes. Patient also shared that he had not yet received compression wraps or alternating air pad for bed. Will follow up.

## 2019-08-20 DIAGNOSIS — M9711XS Periprosthetic fracture around internal prosthetic right knee joint, sequela: Secondary | ICD-10-CM | POA: Diagnosis not present

## 2019-08-20 DIAGNOSIS — R262 Difficulty in walking, not elsewhere classified: Secondary | ICD-10-CM | POA: Diagnosis not present

## 2019-08-20 DIAGNOSIS — M6281 Muscle weakness (generalized): Secondary | ICD-10-CM | POA: Diagnosis not present

## 2019-08-20 DIAGNOSIS — G20 Parkinson's disease: Secondary | ICD-10-CM | POA: Diagnosis not present

## 2019-08-21 DIAGNOSIS — I10 Essential (primary) hypertension: Secondary | ICD-10-CM | POA: Diagnosis not present

## 2019-08-21 DIAGNOSIS — Z87891 Personal history of nicotine dependence: Secondary | ICD-10-CM | POA: Diagnosis not present

## 2019-08-21 DIAGNOSIS — Z9181 History of falling: Secondary | ICD-10-CM | POA: Diagnosis not present

## 2019-08-21 DIAGNOSIS — R338 Other retention of urine: Secondary | ICD-10-CM | POA: Diagnosis not present

## 2019-08-21 DIAGNOSIS — G20 Parkinson's disease: Secondary | ICD-10-CM | POA: Diagnosis not present

## 2019-08-21 DIAGNOSIS — Z8546 Personal history of malignant neoplasm of prostate: Secondary | ICD-10-CM | POA: Diagnosis not present

## 2019-08-21 DIAGNOSIS — N189 Chronic kidney disease, unspecified: Secondary | ICD-10-CM | POA: Diagnosis not present

## 2019-08-21 DIAGNOSIS — Z96651 Presence of right artificial knee joint: Secondary | ICD-10-CM | POA: Diagnosis not present

## 2019-08-21 DIAGNOSIS — Z7984 Long term (current) use of oral hypoglycemic drugs: Secondary | ICD-10-CM | POA: Diagnosis not present

## 2019-08-21 DIAGNOSIS — N3281 Overactive bladder: Secondary | ICD-10-CM | POA: Diagnosis not present

## 2019-08-21 DIAGNOSIS — M5135 Other intervertebral disc degeneration, thoracolumbar region: Secondary | ICD-10-CM | POA: Diagnosis not present

## 2019-08-21 DIAGNOSIS — Z466 Encounter for fitting and adjustment of urinary device: Secondary | ICD-10-CM | POA: Diagnosis not present

## 2019-08-21 DIAGNOSIS — E785 Hyperlipidemia, unspecified: Secondary | ICD-10-CM | POA: Diagnosis not present

## 2019-08-21 DIAGNOSIS — M48061 Spinal stenosis, lumbar region without neurogenic claudication: Secondary | ICD-10-CM | POA: Diagnosis not present

## 2019-08-21 DIAGNOSIS — I872 Venous insufficiency (chronic) (peripheral): Secondary | ICD-10-CM | POA: Diagnosis not present

## 2019-08-21 DIAGNOSIS — F329 Major depressive disorder, single episode, unspecified: Secondary | ICD-10-CM | POA: Diagnosis not present

## 2019-08-21 DIAGNOSIS — M1990 Unspecified osteoarthritis, unspecified site: Secondary | ICD-10-CM | POA: Diagnosis not present

## 2019-08-21 DIAGNOSIS — I129 Hypertensive chronic kidney disease with stage 1 through stage 4 chronic kidney disease, or unspecified chronic kidney disease: Secondary | ICD-10-CM | POA: Diagnosis not present

## 2019-08-21 DIAGNOSIS — B159 Hepatitis A without hepatic coma: Secondary | ICD-10-CM | POA: Diagnosis not present

## 2019-08-21 DIAGNOSIS — G2581 Restless legs syndrome: Secondary | ICD-10-CM | POA: Diagnosis not present

## 2019-08-22 ENCOUNTER — Other Ambulatory Visit: Payer: PPO | Admitting: Adult Health Nurse Practitioner

## 2019-08-22 ENCOUNTER — Other Ambulatory Visit: Payer: Self-pay

## 2019-08-22 ENCOUNTER — Telehealth: Payer: Self-pay

## 2019-08-22 ENCOUNTER — Other Ambulatory Visit: Payer: Self-pay | Admitting: Family Medicine

## 2019-08-22 DIAGNOSIS — Z515 Encounter for palliative care: Secondary | ICD-10-CM | POA: Diagnosis not present

## 2019-08-22 DIAGNOSIS — G20 Parkinson's disease: Secondary | ICD-10-CM

## 2019-08-22 NOTE — Telephone Encounter (Signed)
Request for Alternating Air Pad and replacement for compression wraps faxed to PCP office.

## 2019-08-22 NOTE — Progress Notes (Signed)
[place] Consult Note Telephone: [phone]  Fax: [phone]  PATIENT NAME: Roger Rodriguez DOB: 1940-09-18 MRN: [id number]  PRIMARY CARE PROVIDER:   Jerrol Rodriguez., MD  REFERRING PROVIDER:  Gayland Rodriguez, [place], neurology  RESPONSIBLE PARTY:  Self H: [phone] C: [phone] Roger Rodriguez, son [phone] Roger Rodriguez, son [phone] Roger Rodriguez, caregiver  [phone]     RECOMMENDATIONS and PLAN:  1.  Advanced care planning.  Patient is a DNR.  2.  Parkinson's disease.  Patient is followed by Dr. Manuella Rodriguez and Roger Curry PA with neurology.  Patient is wheelchair bound and spends most of day sitting in a chair or lying in bed. Patient is on Rytary 61.25/245 mg QID and Rytary 23.75/95 mg QID.  Denies any new concerns today.  Continue follow up and recommendations by neurology.  3.  Urinary retention.  Patient is being followed by urology.  Had been recently treated for UTI and continued to have symptoms of dysuria, frequency and weak stream.  Was found to have urinary retention and foley catheter was placed.  He does not complain of urinary symptoms today.  Continue follow up and recommendations by urology.  He does have [facility] home health RN set up to come into the home for monthly catheter changes.  States that RN is trying to get PT/OT through [facility] set up as well.  4.  Wounds.  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  Skin is not broken per caregiver report but the wounds have been there for over a year.  Caregiver does state that the wounds are improving a little bit since the foley has been placed and that controls the wetness around the wounds.  An air mattress would help prevent worsening and development of new pressure injuries.  Patient also wears compression wraps on bilateral lower legs which are wearing out.  Have reached out to PCP with these requests. Will continue to follow up.  Palliative  will continue to monitor for symptom management/decline and make recommendations as needed.  Have next appointment in 6 weeks  I spent 40 minutes providing this consultation,  including time with patient/family, chart review, provider coordination, and documentation. More than 50% of the time in this consultation was spent coordinating communication.   HISTORY OF PRESENT ILLNESS:  Roger Rodriguez is a 79 y.o. year old male with multiple medical problems including Parkinson's disease, CKD, OA, HTN, depression. Palliative Care was asked to help address goals of care.   CODE STATUS: DNR  PPS: 40% HOSPICE ELIGIBILITY/DIAGNOSIS: TBD  PHYSICAL EXAM:  BP 116/68  HR 80  O2 95% on RA General: sitting in recliner in NAD Cardiovascular: regular rate and rhythm Pulmonary: lung sounds clear; normal respiratory effort Abdomen: soft, nontender, + bowel sounds GU: foley catheter in place with dark yellow urine Extremities: trace edema to feet with more to right foot, no joint deformities Skin: no rashes Neurological: Patient is wheelchair bound; has some forgetfulness  PAST MEDICAL HISTORY:  Past Medical History:  Diagnosis Date  . Allergy    allergic rhinitis  . Arthritis    OA  . Chronic kidney disease    stones  . Depression   . Family history of adverse reaction to anesthesia   . Hematuria   . History of kidney stones   . Hyperlipidemia   . Hypertension    no meds since parkinsons meds started  . Incontinence of urine   . Inguinal hernia   .  Neuromuscular disorder ([place])    parkinsons  . Overactive bladder   . Parkinson's disease ([place])   . Prostate cancer ([place])   . Rhinitis, allergic   . Shortness of breath dyspnea    doe secondary to parkinsons    SOCIAL HX:  Social History   Tobacco Use  . Smoking status: Former Smoker    Types: Cigars    Quit date: 11/11/1968    Years since quitting: 50.8  . Smokeless tobacco: Former Systems developer    Types: Chew    Quit date: 11/12/2003  . Tobacco  comment: Occassionally, also chewed tobacco till 2005. Quit smoking 1973  Substance Use Topics  . Alcohol use: No    Alcohol/week: 0.0 standard drinks    ALLERGIES:  Allergies  Allergen Reactions  . Sulfa Antibiotics Swelling    Lip swelling     PERTINENT MEDICATIONS:  Outpatient Encounter Medications as of 08/22/2019  Medication Sig  . aspirin EC 325 MG tablet Take 1 tablet (325 mg total) by mouth daily.  . budesonide (RHINOCORT AQUA) 32 MCG/ACT nasal spray USE 1 PUFF IN EACH NOSTRIL EVERY DAY AS NEEDED (Patient taking differently: Place 1 spray into both nostrils daily as needed for allergies. )  . cholecalciferol (VITAMIN D) 1000 units tablet Take 1,000 Units by mouth daily.   [name] docusate sodium (COLACE) 100 MG capsule Take 1 capsule (100 mg total) by mouth 2 (two) times daily. (Patient taking differently: Take 100 mg by mouth daily as needed for mild constipation. )  . enalapril (VASOTEC) 5 MG tablet TAKE 1 TABLET BY MOUTH EVERY DAY  . gabapentin (NEURONTIN) 400 MG capsule Take 1,200 mg by mouth 3 (three) times daily.   [name] glucose blood (QUINTET AC BLOOD GLUCOSE TEST) test strip Use as instructed  . Hypromellose (ARTIFICIAL TEARS OP) Place 1 drop into both eyes 4 (four) times daily as needed (for dry eyes).   . insulin aspart (NOVOLOG) 100 UNIT/ML injection Inject 0-15 Units into the skin 3 (three) times daily with meals.  [name] loratadine (CLARITIN) 10 MG tablet Take 10 mg by mouth daily.  . metFORMIN (GLUCOPHAGE) 500 MG tablet TAKE 1 TABLET (500 MG TOTAL) BY MOUTH 2 (TWO) TIMES DAILY WITH A MEAL.  . mirabegron ER (MYRBETRIQ) 25 MG TB24 tablet Myrbetriq 25 mg tablet,extended release  TAKE 1 TABLET (25 MG TOTAL) 2 (TWO) TIMES DAILY BY MOUTH. (Patient taking differently: Take 25 mg by mouth daily. )  . Multiple Vitamin (MULTIVITAMIN WITH MINERALS) TABS tablet Take 1 tablet by mouth daily.  . NONFORMULARY OR COMPOUNDED ITEM Trimix (30/1/10)-(Pap/Phent/PGE)  Dosage: Inject 0.53ml as directed -  may increase as directed per injection   Vial 18ml  Qty #10 Refills [place] [phone] Fax [phone]  . oxyCODONE (ROXICODONE) 5 MG immediate release tablet Take 1 tablet (5 mg total) by mouth every 6 (six) hours as needed for severe pain.  . polyethylene glycol (MIRALAX / GLYCOLAX) packet Take 17 g by mouth daily.  . pramipexole (MIRAPEX) 0.5 MG tablet Take 2 tablets (1 mg total) by mouth at bedtime. (Patient taking differently: Take 0.25-0.5 mg by mouth See admin instructions. Take 0.25 mg by mouth twice daily. Take 0.5 mg by mouth at bedtime.)  . RYTARY 23.75-95 MG CPCR Take 4 capsules by mouth 4 (four) times daily.   . vitamin B-12 (CYANOCOBALAMIN) 1000 MCG tablet Take 1,000 mcg by mouth daily.  . vitamin C (ASCORBIC ACID) 500 MG tablet Take 500 mg by mouth daily.  [name]  vitamin E (VITAMIN E) 400 UNIT capsule Take 400 Units by mouth daily.    No facility-administered encounter medications on file as of 08/22/2019.      Sadhana Frater Jenetta Downer, NP

## 2019-08-23 DIAGNOSIS — Z96651 Presence of right artificial knee joint: Secondary | ICD-10-CM | POA: Diagnosis not present

## 2019-08-23 DIAGNOSIS — M48061 Spinal stenosis, lumbar region without neurogenic claudication: Secondary | ICD-10-CM | POA: Diagnosis not present

## 2019-08-23 DIAGNOSIS — R338 Other retention of urine: Secondary | ICD-10-CM | POA: Diagnosis not present

## 2019-08-23 DIAGNOSIS — N189 Chronic kidney disease, unspecified: Secondary | ICD-10-CM | POA: Diagnosis not present

## 2019-08-23 DIAGNOSIS — N3281 Overactive bladder: Secondary | ICD-10-CM | POA: Diagnosis not present

## 2019-08-23 DIAGNOSIS — Z8546 Personal history of malignant neoplasm of prostate: Secondary | ICD-10-CM | POA: Diagnosis not present

## 2019-08-23 DIAGNOSIS — M1990 Unspecified osteoarthritis, unspecified site: Secondary | ICD-10-CM | POA: Diagnosis not present

## 2019-08-23 DIAGNOSIS — Z7984 Long term (current) use of oral hypoglycemic drugs: Secondary | ICD-10-CM | POA: Diagnosis not present

## 2019-08-23 DIAGNOSIS — G2581 Restless legs syndrome: Secondary | ICD-10-CM | POA: Diagnosis not present

## 2019-08-23 DIAGNOSIS — Z87891 Personal history of nicotine dependence: Secondary | ICD-10-CM | POA: Diagnosis not present

## 2019-08-23 DIAGNOSIS — Z466 Encounter for fitting and adjustment of urinary device: Secondary | ICD-10-CM | POA: Diagnosis not present

## 2019-08-23 DIAGNOSIS — I10 Essential (primary) hypertension: Secondary | ICD-10-CM | POA: Diagnosis not present

## 2019-08-23 DIAGNOSIS — F329 Major depressive disorder, single episode, unspecified: Secondary | ICD-10-CM | POA: Diagnosis not present

## 2019-08-23 DIAGNOSIS — Z9181 History of falling: Secondary | ICD-10-CM | POA: Diagnosis not present

## 2019-08-23 DIAGNOSIS — B159 Hepatitis A without hepatic coma: Secondary | ICD-10-CM | POA: Diagnosis not present

## 2019-08-23 DIAGNOSIS — I872 Venous insufficiency (chronic) (peripheral): Secondary | ICD-10-CM | POA: Diagnosis not present

## 2019-08-23 DIAGNOSIS — G20 Parkinson's disease: Secondary | ICD-10-CM | POA: Diagnosis not present

## 2019-08-23 DIAGNOSIS — I129 Hypertensive chronic kidney disease with stage 1 through stage 4 chronic kidney disease, or unspecified chronic kidney disease: Secondary | ICD-10-CM | POA: Diagnosis not present

## 2019-08-23 DIAGNOSIS — M5135 Other intervertebral disc degeneration, thoracolumbar region: Secondary | ICD-10-CM | POA: Diagnosis not present

## 2019-08-23 DIAGNOSIS — E785 Hyperlipidemia, unspecified: Secondary | ICD-10-CM | POA: Diagnosis not present

## 2019-08-28 ENCOUNTER — Telehealth: Payer: Self-pay

## 2019-08-28 ENCOUNTER — Telehealth: Payer: Self-pay | Admitting: Family Medicine

## 2019-08-28 DIAGNOSIS — Z96651 Presence of right artificial knee joint: Secondary | ICD-10-CM | POA: Diagnosis not present

## 2019-08-28 DIAGNOSIS — G20 Parkinson's disease: Secondary | ICD-10-CM | POA: Diagnosis not present

## 2019-08-28 DIAGNOSIS — M5135 Other intervertebral disc degeneration, thoracolumbar region: Secondary | ICD-10-CM | POA: Diagnosis not present

## 2019-08-28 DIAGNOSIS — Z466 Encounter for fitting and adjustment of urinary device: Secondary | ICD-10-CM | POA: Diagnosis not present

## 2019-08-28 DIAGNOSIS — M1990 Unspecified osteoarthritis, unspecified site: Secondary | ICD-10-CM | POA: Diagnosis not present

## 2019-08-28 DIAGNOSIS — M48061 Spinal stenosis, lumbar region without neurogenic claudication: Secondary | ICD-10-CM | POA: Diagnosis not present

## 2019-08-28 DIAGNOSIS — E785 Hyperlipidemia, unspecified: Secondary | ICD-10-CM | POA: Diagnosis not present

## 2019-08-28 DIAGNOSIS — F329 Major depressive disorder, single episode, unspecified: Secondary | ICD-10-CM | POA: Diagnosis not present

## 2019-08-28 DIAGNOSIS — G2581 Restless legs syndrome: Secondary | ICD-10-CM | POA: Diagnosis not present

## 2019-08-28 DIAGNOSIS — Z7984 Long term (current) use of oral hypoglycemic drugs: Secondary | ICD-10-CM | POA: Diagnosis not present

## 2019-08-28 DIAGNOSIS — Z9181 History of falling: Secondary | ICD-10-CM | POA: Diagnosis not present

## 2019-08-28 DIAGNOSIS — I129 Hypertensive chronic kidney disease with stage 1 through stage 4 chronic kidney disease, or unspecified chronic kidney disease: Secondary | ICD-10-CM | POA: Diagnosis not present

## 2019-08-28 DIAGNOSIS — Z8546 Personal history of malignant neoplasm of prostate: Secondary | ICD-10-CM | POA: Diagnosis not present

## 2019-08-28 DIAGNOSIS — N189 Chronic kidney disease, unspecified: Secondary | ICD-10-CM | POA: Diagnosis not present

## 2019-08-28 DIAGNOSIS — B159 Hepatitis A without hepatic coma: Secondary | ICD-10-CM | POA: Diagnosis not present

## 2019-08-28 DIAGNOSIS — I10 Essential (primary) hypertension: Secondary | ICD-10-CM | POA: Diagnosis not present

## 2019-08-28 DIAGNOSIS — N3281 Overactive bladder: Secondary | ICD-10-CM | POA: Diagnosis not present

## 2019-08-28 DIAGNOSIS — R338 Other retention of urine: Secondary | ICD-10-CM | POA: Diagnosis not present

## 2019-08-28 DIAGNOSIS — Z87891 Personal history of nicotine dependence: Secondary | ICD-10-CM | POA: Diagnosis not present

## 2019-08-28 DIAGNOSIS — I872 Venous insufficiency (chronic) (peripheral): Secondary | ICD-10-CM | POA: Diagnosis not present

## 2019-08-28 NOTE — Telephone Encounter (Signed)
Di Kindle, with [facility], requesting VO for speech therapy for 1w5.     CB#  [phone]

## 2019-08-28 NOTE — Telephone Encounter (Signed)
Stephanie from Occupational health called for orders on patient, attempted to call back voicemail left to find out what orders are needed

## 2019-08-28 NOTE — Telephone Encounter (Signed)
Left detailed message giving verbal okay for speech therapy.

## 2019-08-29 NOTE — Telephone Encounter (Signed)
ok 

## 2019-09-07 DIAGNOSIS — G20 Parkinson's disease: Secondary | ICD-10-CM | POA: Diagnosis not present

## 2019-09-07 DIAGNOSIS — G2581 Restless legs syndrome: Secondary | ICD-10-CM | POA: Diagnosis not present

## 2019-09-07 DIAGNOSIS — R4189 Other symptoms and signs involving cognitive functions and awareness: Secondary | ICD-10-CM | POA: Diagnosis not present

## 2019-09-07 DIAGNOSIS — G4752 REM sleep behavior disorder: Secondary | ICD-10-CM | POA: Diagnosis not present

## 2019-09-08 DIAGNOSIS — Z87891 Personal history of nicotine dependence: Secondary | ICD-10-CM | POA: Diagnosis not present

## 2019-09-08 DIAGNOSIS — Z466 Encounter for fitting and adjustment of urinary device: Secondary | ICD-10-CM | POA: Diagnosis not present

## 2019-09-08 DIAGNOSIS — M5135 Other intervertebral disc degeneration, thoracolumbar region: Secondary | ICD-10-CM | POA: Diagnosis not present

## 2019-09-08 DIAGNOSIS — B159 Hepatitis A without hepatic coma: Secondary | ICD-10-CM | POA: Diagnosis not present

## 2019-09-08 DIAGNOSIS — M48061 Spinal stenosis, lumbar region without neurogenic claudication: Secondary | ICD-10-CM | POA: Diagnosis not present

## 2019-09-08 DIAGNOSIS — M1990 Unspecified osteoarthritis, unspecified site: Secondary | ICD-10-CM | POA: Diagnosis not present

## 2019-09-08 DIAGNOSIS — Z7984 Long term (current) use of oral hypoglycemic drugs: Secondary | ICD-10-CM | POA: Diagnosis not present

## 2019-09-08 DIAGNOSIS — I872 Venous insufficiency (chronic) (peripheral): Secondary | ICD-10-CM | POA: Diagnosis not present

## 2019-09-08 DIAGNOSIS — R338 Other retention of urine: Secondary | ICD-10-CM | POA: Diagnosis not present

## 2019-09-08 DIAGNOSIS — N3281 Overactive bladder: Secondary | ICD-10-CM | POA: Diagnosis not present

## 2019-09-08 DIAGNOSIS — I10 Essential (primary) hypertension: Secondary | ICD-10-CM | POA: Diagnosis not present

## 2019-09-08 DIAGNOSIS — G20 Parkinson's disease: Secondary | ICD-10-CM | POA: Diagnosis not present

## 2019-09-08 DIAGNOSIS — G2581 Restless legs syndrome: Secondary | ICD-10-CM | POA: Diagnosis not present

## 2019-09-08 DIAGNOSIS — I129 Hypertensive chronic kidney disease with stage 1 through stage 4 chronic kidney disease, or unspecified chronic kidney disease: Secondary | ICD-10-CM | POA: Diagnosis not present

## 2019-09-08 DIAGNOSIS — N189 Chronic kidney disease, unspecified: Secondary | ICD-10-CM | POA: Diagnosis not present

## 2019-09-08 DIAGNOSIS — Z96651 Presence of right artificial knee joint: Secondary | ICD-10-CM | POA: Diagnosis not present

## 2019-09-08 DIAGNOSIS — Z8546 Personal history of malignant neoplasm of prostate: Secondary | ICD-10-CM | POA: Diagnosis not present

## 2019-09-08 DIAGNOSIS — E785 Hyperlipidemia, unspecified: Secondary | ICD-10-CM | POA: Diagnosis not present

## 2019-09-08 DIAGNOSIS — Z9181 History of falling: Secondary | ICD-10-CM | POA: Diagnosis not present

## 2019-09-08 DIAGNOSIS — F329 Major depressive disorder, single episode, unspecified: Secondary | ICD-10-CM | POA: Diagnosis not present

## 2019-09-12 ENCOUNTER — Ambulatory Visit (INDEPENDENT_AMBULATORY_CARE_PROVIDER_SITE_OTHER): Payer: PPO | Admitting: Urology

## 2019-09-12 ENCOUNTER — Encounter: Payer: Self-pay | Admitting: Urology

## 2019-09-12 ENCOUNTER — Other Ambulatory Visit: Payer: Self-pay

## 2019-09-12 DIAGNOSIS — R339 Retention of urine, unspecified: Secondary | ICD-10-CM | POA: Diagnosis not present

## 2019-09-13 DIAGNOSIS — I129 Hypertensive chronic kidney disease with stage 1 through stage 4 chronic kidney disease, or unspecified chronic kidney disease: Secondary | ICD-10-CM | POA: Diagnosis not present

## 2019-09-13 DIAGNOSIS — F329 Major depressive disorder, single episode, unspecified: Secondary | ICD-10-CM | POA: Diagnosis not present

## 2019-09-13 DIAGNOSIS — Z466 Encounter for fitting and adjustment of urinary device: Secondary | ICD-10-CM | POA: Diagnosis not present

## 2019-09-13 DIAGNOSIS — Z8546 Personal history of malignant neoplasm of prostate: Secondary | ICD-10-CM | POA: Diagnosis not present

## 2019-09-13 DIAGNOSIS — B159 Hepatitis A without hepatic coma: Secondary | ICD-10-CM | POA: Diagnosis not present

## 2019-09-13 DIAGNOSIS — G2581 Restless legs syndrome: Secondary | ICD-10-CM | POA: Diagnosis not present

## 2019-09-13 DIAGNOSIS — N3281 Overactive bladder: Secondary | ICD-10-CM | POA: Diagnosis not present

## 2019-09-13 DIAGNOSIS — I10 Essential (primary) hypertension: Secondary | ICD-10-CM | POA: Diagnosis not present

## 2019-09-13 DIAGNOSIS — M1990 Unspecified osteoarthritis, unspecified site: Secondary | ICD-10-CM | POA: Diagnosis not present

## 2019-09-13 DIAGNOSIS — N189 Chronic kidney disease, unspecified: Secondary | ICD-10-CM | POA: Diagnosis not present

## 2019-09-13 DIAGNOSIS — Z96651 Presence of right artificial knee joint: Secondary | ICD-10-CM | POA: Diagnosis not present

## 2019-09-13 DIAGNOSIS — I872 Venous insufficiency (chronic) (peripheral): Secondary | ICD-10-CM | POA: Diagnosis not present

## 2019-09-13 DIAGNOSIS — E785 Hyperlipidemia, unspecified: Secondary | ICD-10-CM | POA: Diagnosis not present

## 2019-09-13 DIAGNOSIS — Z87891 Personal history of nicotine dependence: Secondary | ICD-10-CM | POA: Diagnosis not present

## 2019-09-13 DIAGNOSIS — R338 Other retention of urine: Secondary | ICD-10-CM | POA: Diagnosis not present

## 2019-09-13 DIAGNOSIS — G20 Parkinson's disease: Secondary | ICD-10-CM | POA: Diagnosis not present

## 2019-09-13 DIAGNOSIS — Z7984 Long term (current) use of oral hypoglycemic drugs: Secondary | ICD-10-CM | POA: Diagnosis not present

## 2019-09-13 DIAGNOSIS — M5135 Other intervertebral disc degeneration, thoracolumbar region: Secondary | ICD-10-CM | POA: Diagnosis not present

## 2019-09-13 DIAGNOSIS — M48061 Spinal stenosis, lumbar region without neurogenic claudication: Secondary | ICD-10-CM | POA: Diagnosis not present

## 2019-09-13 DIAGNOSIS — Z9181 History of falling: Secondary | ICD-10-CM | POA: Diagnosis not present

## 2019-09-16 NOTE — Progress Notes (Signed)
Cath Change/ Replacement  Patient is present today for a catheter change due to urinary retention.  16 ml of water was removed from the balloon, a 16 FR foley cath was removed with out difficulty.  Patient was cleaned and prepped in a sterile fashion with betadine. A 16 FR foley cath was replaced into the bladder no complications were noted Urine return was noted 10 ml and urine was yellow in color. The balloon was filled with 70ml of sterile water. A leg bag was attached for drainage.  Patient was given proper instruction on catheter care.    Performed by: Zara Council, PA-C  Follow up: Home Health to exchange catheters

## 2019-09-17 DIAGNOSIS — M6281 Muscle weakness (generalized): Secondary | ICD-10-CM | POA: Diagnosis not present

## 2019-09-17 DIAGNOSIS — G20 Parkinson's disease: Secondary | ICD-10-CM | POA: Diagnosis not present

## 2019-09-17 DIAGNOSIS — M9711XS Periprosthetic fracture around internal prosthetic right knee joint, sequela: Secondary | ICD-10-CM | POA: Diagnosis not present

## 2019-09-17 DIAGNOSIS — R262 Difficulty in walking, not elsewhere classified: Secondary | ICD-10-CM | POA: Diagnosis not present

## 2019-09-19 DIAGNOSIS — I129 Hypertensive chronic kidney disease with stage 1 through stage 4 chronic kidney disease, or unspecified chronic kidney disease: Secondary | ICD-10-CM | POA: Diagnosis not present

## 2019-09-19 DIAGNOSIS — Z9181 History of falling: Secondary | ICD-10-CM | POA: Diagnosis not present

## 2019-09-19 DIAGNOSIS — R338 Other retention of urine: Secondary | ICD-10-CM | POA: Diagnosis not present

## 2019-09-19 DIAGNOSIS — M5135 Other intervertebral disc degeneration, thoracolumbar region: Secondary | ICD-10-CM | POA: Diagnosis not present

## 2019-09-19 DIAGNOSIS — Z87891 Personal history of nicotine dependence: Secondary | ICD-10-CM | POA: Diagnosis not present

## 2019-09-19 DIAGNOSIS — M1990 Unspecified osteoarthritis, unspecified site: Secondary | ICD-10-CM | POA: Diagnosis not present

## 2019-09-19 DIAGNOSIS — Z466 Encounter for fitting and adjustment of urinary device: Secondary | ICD-10-CM | POA: Diagnosis not present

## 2019-09-19 DIAGNOSIS — G2581 Restless legs syndrome: Secondary | ICD-10-CM | POA: Diagnosis not present

## 2019-09-19 DIAGNOSIS — Z7984 Long term (current) use of oral hypoglycemic drugs: Secondary | ICD-10-CM | POA: Diagnosis not present

## 2019-09-19 DIAGNOSIS — N3281 Overactive bladder: Secondary | ICD-10-CM | POA: Diagnosis not present

## 2019-09-19 DIAGNOSIS — I872 Venous insufficiency (chronic) (peripheral): Secondary | ICD-10-CM | POA: Diagnosis not present

## 2019-09-19 DIAGNOSIS — Z8546 Personal history of malignant neoplasm of prostate: Secondary | ICD-10-CM | POA: Diagnosis not present

## 2019-09-19 DIAGNOSIS — M48061 Spinal stenosis, lumbar region without neurogenic claudication: Secondary | ICD-10-CM | POA: Diagnosis not present

## 2019-09-19 DIAGNOSIS — F329 Major depressive disorder, single episode, unspecified: Secondary | ICD-10-CM | POA: Diagnosis not present

## 2019-09-19 DIAGNOSIS — N189 Chronic kidney disease, unspecified: Secondary | ICD-10-CM | POA: Diagnosis not present

## 2019-09-19 DIAGNOSIS — B159 Hepatitis A without hepatic coma: Secondary | ICD-10-CM | POA: Diagnosis not present

## 2019-09-19 DIAGNOSIS — I10 Essential (primary) hypertension: Secondary | ICD-10-CM | POA: Diagnosis not present

## 2019-09-19 DIAGNOSIS — Z96651 Presence of right artificial knee joint: Secondary | ICD-10-CM | POA: Diagnosis not present

## 2019-09-19 DIAGNOSIS — G20 Parkinson's disease: Secondary | ICD-10-CM | POA: Diagnosis not present

## 2019-09-19 DIAGNOSIS — E785 Hyperlipidemia, unspecified: Secondary | ICD-10-CM | POA: Diagnosis not present

## 2019-09-27 DIAGNOSIS — G2581 Restless legs syndrome: Secondary | ICD-10-CM | POA: Diagnosis not present

## 2019-09-27 DIAGNOSIS — M48061 Spinal stenosis, lumbar region without neurogenic claudication: Secondary | ICD-10-CM | POA: Diagnosis not present

## 2019-09-27 DIAGNOSIS — Z7984 Long term (current) use of oral hypoglycemic drugs: Secondary | ICD-10-CM | POA: Diagnosis not present

## 2019-09-27 DIAGNOSIS — Z466 Encounter for fitting and adjustment of urinary device: Secondary | ICD-10-CM | POA: Diagnosis not present

## 2019-09-27 DIAGNOSIS — B159 Hepatitis A without hepatic coma: Secondary | ICD-10-CM | POA: Diagnosis not present

## 2019-09-27 DIAGNOSIS — Z9181 History of falling: Secondary | ICD-10-CM | POA: Diagnosis not present

## 2019-09-27 DIAGNOSIS — I129 Hypertensive chronic kidney disease with stage 1 through stage 4 chronic kidney disease, or unspecified chronic kidney disease: Secondary | ICD-10-CM | POA: Diagnosis not present

## 2019-09-27 DIAGNOSIS — Z8546 Personal history of malignant neoplasm of prostate: Secondary | ICD-10-CM | POA: Diagnosis not present

## 2019-09-27 DIAGNOSIS — N189 Chronic kidney disease, unspecified: Secondary | ICD-10-CM | POA: Diagnosis not present

## 2019-09-27 DIAGNOSIS — M5135 Other intervertebral disc degeneration, thoracolumbar region: Secondary | ICD-10-CM | POA: Diagnosis not present

## 2019-09-27 DIAGNOSIS — I872 Venous insufficiency (chronic) (peripheral): Secondary | ICD-10-CM | POA: Diagnosis not present

## 2019-09-27 DIAGNOSIS — E785 Hyperlipidemia, unspecified: Secondary | ICD-10-CM | POA: Diagnosis not present

## 2019-09-27 DIAGNOSIS — R338 Other retention of urine: Secondary | ICD-10-CM | POA: Diagnosis not present

## 2019-09-27 DIAGNOSIS — Z96651 Presence of right artificial knee joint: Secondary | ICD-10-CM | POA: Diagnosis not present

## 2019-09-27 DIAGNOSIS — M1990 Unspecified osteoarthritis, unspecified site: Secondary | ICD-10-CM | POA: Diagnosis not present

## 2019-09-27 DIAGNOSIS — N3281 Overactive bladder: Secondary | ICD-10-CM | POA: Diagnosis not present

## 2019-09-27 DIAGNOSIS — I10 Essential (primary) hypertension: Secondary | ICD-10-CM | POA: Diagnosis not present

## 2019-09-27 DIAGNOSIS — F329 Major depressive disorder, single episode, unspecified: Secondary | ICD-10-CM | POA: Diagnosis not present

## 2019-09-27 DIAGNOSIS — Z87891 Personal history of nicotine dependence: Secondary | ICD-10-CM | POA: Diagnosis not present

## 2019-09-27 DIAGNOSIS — G20 Parkinson's disease: Secondary | ICD-10-CM | POA: Diagnosis not present

## 2019-10-03 ENCOUNTER — Other Ambulatory Visit: Payer: PPO | Admitting: Adult Health Nurse Practitioner

## 2019-10-03 ENCOUNTER — Other Ambulatory Visit: Payer: Self-pay

## 2019-10-03 DIAGNOSIS — Z515 Encounter for palliative care: Secondary | ICD-10-CM | POA: Diagnosis not present

## 2019-10-03 DIAGNOSIS — G20 Parkinson's disease: Secondary | ICD-10-CM

## 2019-10-03 NOTE — Progress Notes (Signed)
[facility] Consult Note Telephone: [phone]  Fax: [phone]  PATIENT NAME: Roger Rodriguez DOB: 1940-12-06 MRN: [id number]  PRIMARY CARE PROVIDER:   [name]., MD  REFERRING PROVIDER:  Gayland Curry, [place], neurology  RESPONSIBLE PARTY:   Self H: [phone] C: [phone] Jarvis Yochum, son [phone] Quintus Greear, son [phone] [name], caregiver [phone]        RECOMMENDATIONS and PLAN:  1.  Advanced care planning. Patient is a DNR.  2.  Parkinson's disease.  Patient followed by Dr. Manuella Ghazi.  Patient is wheelchair bound and spends most of day sitting in a chair or lying in bed. Patient is on Rytary 61.25/245 mg QID and Rytary 23.75/95 mg increased recently to 2 tabs QID.  States that increase was due to having more freezing episodes.  States that he is uncertain if the increase is working.Was also started on buspar and sertraline for anxiety.  He states that he is feeling less antsy. He is receiving PT through [facility]. Continue follow up and recommendations by Dr. Manuella Ghazi.  3.  Urinary retention.  Patient is being seen by urology.  His last foley change was done in the office.  Next month he starts having home health RN through [facility] change his foley at home.  He wants to make sure that this is lined up.  Will have Clinical navigator reach out to [facility] to make sure this is lined up.  Continue follow up and recommendations by urology.  4. Wounds.  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  Skin is not broken per caregiver report but the wounds have been there for over a year.  Caregiver does state that the wounds are improving a little bit since the foley has been placed and that controls the wetness around the wounds.  An air mattress would help prevent worsening and development of new pressure injuries. Patient also wears compression wraps on bilateral lower legs which are wearing  out.  Have reached out to PCP with these requests. Will continue to follow up.  5.  Nutrition.  Patient states that he has lost about 20 pounds since Christmas.  States that this has been intentional as he has cut back on his calorie intake and trying to eat healthy.  Encouraged to continue eating healthy.  Palliative will continue to monitor for symptom management/decline and make recommendations as needed. Have next appointment in 6 weeks  I spent 50 minutes providing this consultation,  from 2:00 to 2:50including time with patient/family, chart review, provider coordination, and documentation . More than 50% of the time in this consultation was spent coordinating communication.   HISTORY OF PRESENT ILLNESS:  Roger Rodriguez is a 79 y.o. year old male with multiple medical problems including Parkinson's disease, CKD, OA, HTN, depression. Palliative Care was asked to help address goals of care.   CODE STATUS: DNR  PPS: 40% HOSPICE ELIGIBILITY/DIAGNOSIS: TBD  PHYSICAL EXAM:  BP 118/68  HR 77  O2 96% on RA General: sitting in wheelchair in NAD Cardiovascular: regular rate and rhythm Pulmonary: lung sounds clear; normal respiratory effort Abdomen: soft, nontender, + bowel sounds GU: foley catheter in place with clear yellow urine Extremities: trace edema to feet with more to right foot, no joint deformities Skin: no rashes Neurological: Patient is wheelchair bound; has some forgetfulness  PAST MEDICAL HISTORY:  Past Medical History:  Diagnosis Date  . Allergy    allergic rhinitis  .  Arthritis    OA  . Chronic kidney disease    stones  . Depression   . Family history of adverse reaction to anesthesia   . Hematuria   . History of kidney stones   . Hyperlipidemia   . Hypertension    no meds since parkinsons meds started  . Incontinence of urine   . Inguinal hernia   . Neuromuscular disorder ([place])    parkinsons  . Overactive bladder   . Parkinson's disease ([place])   . Prostate  cancer ([place])   . Rhinitis, allergic   . Shortness of breath dyspnea    doe secondary to parkinsons    SOCIAL HX:  Social History   Tobacco Use  . Smoking status: Former Smoker    Types: Cigars    Quit date: 11/11/1968    Years since quitting: 50.9  . Smokeless tobacco: Former Systems developer    Types: Chew    Quit date: 11/12/2003  . Tobacco comment: Occassionally, also chewed tobacco till 2005. Quit smoking 1973  Substance Use Topics  . Alcohol use: No    Alcohol/week: 0.0 standard drinks    ALLERGIES:  Allergies  Allergen Reactions  . Sulfa Antibiotics Swelling    Lip swelling     PERTINENT MEDICATIONS:  Outpatient Encounter Medications as of 10/03/2019  Medication Sig  . budesonide (RHINOCORT AQUA) 32 MCG/ACT nasal spray USE 1 PUFF IN EACH NOSTRIL EVERY DAY AS NEEDED (Patient taking differently: Place 1 spray into both nostrils daily as needed for allergies. )  . busPIRone (BUSPAR) 7.5 MG tablet   . celecoxib (CELEBREX) 100 MG capsule TAKE 1 CAPSULE BY MOUTH TWICE A DAY  . cholecalciferol (VITAMIN D) 1000 units tablet Take 1,000 Units by mouth daily.   . enalapril (VASOTEC) 5 MG tablet TAKE 1 TABLET BY MOUTH EVERY DAY  . gabapentin (NEURONTIN) 400 MG capsule Take 1,200 mg by mouth 3 (three) times daily.   [name] glucose blood (QUINTET AC BLOOD GLUCOSE TEST) test strip Use as instructed  . Hypromellose (ARTIFICIAL TEARS OP) Place 1 drop into both eyes 4 (four) times daily as needed (for dry eyes).   . insulin aspart (NOVOLOG) 100 UNIT/ML injection Inject 0-15 Units into the skin 3 (three) times daily with meals.  [name] loratadine (CLARITIN) 10 MG tablet Take 10 mg by mouth daily.  . metFORMIN (GLUCOPHAGE) 500 MG tablet TAKE 1 TABLET (500 MG TOTAL) BY MOUTH 2 (TWO) TIMES DAILY WITH A MEAL.  . mirabegron ER (MYRBETRIQ) 25 MG TB24 tablet Myrbetriq 25 mg tablet,extended release  TAKE 1 TABLET (25 MG TOTAL) 2 (TWO) TIMES DAILY BY MOUTH. (Patient not taking: Reported on 09/12/2019)  . Multiple Vitamin  (MULTIVITAMIN WITH MINERALS) TABS tablet Take 1 tablet by mouth daily.  . polyethylene glycol (MIRALAX / GLYCOLAX) packet Take 17 g by mouth daily.  . pramipexole (MIRAPEX) 0.5 MG tablet Take 2 tablets (1 mg total) by mouth at bedtime. (Patient taking differently: Take 0.25-0.5 mg by mouth See admin instructions. Take 0.25 mg by mouth twice daily. Take 0.5 mg by mouth at bedtime.)  . RYTARY 23.75-95 MG CPCR Take 4 capsules by mouth 4 (four) times daily.   . vitamin B-12 (CYANOCOBALAMIN) 1000 MCG tablet Take 1,000 mcg by mouth daily.  . vitamin C (ASCORBIC ACID) 500 MG tablet Take 500 mg by mouth daily.  . vitamin E (VITAMIN E) 400 UNIT capsule Take 400 Units by mouth daily.    No facility-administered encounter medications on file as of 10/03/2019.  Estephan Gallardo Jenetta Downer, NP

## 2019-10-04 DIAGNOSIS — R338 Other retention of urine: Secondary | ICD-10-CM | POA: Diagnosis not present

## 2019-10-04 DIAGNOSIS — Z8546 Personal history of malignant neoplasm of prostate: Secondary | ICD-10-CM | POA: Diagnosis not present

## 2019-10-04 DIAGNOSIS — I872 Venous insufficiency (chronic) (peripheral): Secondary | ICD-10-CM | POA: Diagnosis not present

## 2019-10-04 DIAGNOSIS — Z9181 History of falling: Secondary | ICD-10-CM | POA: Diagnosis not present

## 2019-10-04 DIAGNOSIS — Z96651 Presence of right artificial knee joint: Secondary | ICD-10-CM | POA: Diagnosis not present

## 2019-10-04 DIAGNOSIS — F329 Major depressive disorder, single episode, unspecified: Secondary | ICD-10-CM | POA: Diagnosis not present

## 2019-10-04 DIAGNOSIS — M1990 Unspecified osteoarthritis, unspecified site: Secondary | ICD-10-CM | POA: Diagnosis not present

## 2019-10-04 DIAGNOSIS — Z87891 Personal history of nicotine dependence: Secondary | ICD-10-CM | POA: Diagnosis not present

## 2019-10-04 DIAGNOSIS — G20 Parkinson's disease: Secondary | ICD-10-CM | POA: Diagnosis not present

## 2019-10-04 DIAGNOSIS — Z7984 Long term (current) use of oral hypoglycemic drugs: Secondary | ICD-10-CM | POA: Diagnosis not present

## 2019-10-04 DIAGNOSIS — G2581 Restless legs syndrome: Secondary | ICD-10-CM | POA: Diagnosis not present

## 2019-10-04 DIAGNOSIS — I10 Essential (primary) hypertension: Secondary | ICD-10-CM | POA: Diagnosis not present

## 2019-10-04 DIAGNOSIS — M5135 Other intervertebral disc degeneration, thoracolumbar region: Secondary | ICD-10-CM | POA: Diagnosis not present

## 2019-10-04 DIAGNOSIS — E785 Hyperlipidemia, unspecified: Secondary | ICD-10-CM | POA: Diagnosis not present

## 2019-10-04 DIAGNOSIS — Z466 Encounter for fitting and adjustment of urinary device: Secondary | ICD-10-CM | POA: Diagnosis not present

## 2019-10-04 DIAGNOSIS — N189 Chronic kidney disease, unspecified: Secondary | ICD-10-CM | POA: Diagnosis not present

## 2019-10-04 DIAGNOSIS — M48061 Spinal stenosis, lumbar region without neurogenic claudication: Secondary | ICD-10-CM | POA: Diagnosis not present

## 2019-10-04 DIAGNOSIS — I129 Hypertensive chronic kidney disease with stage 1 through stage 4 chronic kidney disease, or unspecified chronic kidney disease: Secondary | ICD-10-CM | POA: Diagnosis not present

## 2019-10-04 DIAGNOSIS — N3281 Overactive bladder: Secondary | ICD-10-CM | POA: Diagnosis not present

## 2019-10-04 DIAGNOSIS — B159 Hepatitis A without hepatic coma: Secondary | ICD-10-CM | POA: Diagnosis not present

## 2019-10-11 ENCOUNTER — Telehealth: Payer: Self-pay

## 2019-10-11 ENCOUNTER — Telehealth: Payer: Self-pay | Admitting: *Deleted

## 2019-10-11 NOTE — Telephone Encounter (Signed)
Returned call to patient.

## 2019-10-11 NOTE — Telephone Encounter (Signed)
Patient states that he will be due for a refill for Rytary some. Roger Rodriguez usually helps patient with this. Please advise refill?

## 2019-10-11 NOTE — Telephone Encounter (Signed)
Copied from [place] [phone]. Topic: General - Other >> Oct 11, 2019 11:53 AM Yvette Rack wrote: Reason for CRM: Pt called to see if a Rx request for a mattress was received from [facility].

## 2019-10-11 NOTE — Telephone Encounter (Signed)
Pt calling back. Pt would like another call from the office. Please advise.

## 2019-10-12 ENCOUNTER — Telehealth: Payer: Self-pay

## 2019-10-12 DIAGNOSIS — Z7984 Long term (current) use of oral hypoglycemic drugs: Secondary | ICD-10-CM | POA: Diagnosis not present

## 2019-10-12 DIAGNOSIS — R338 Other retention of urine: Secondary | ICD-10-CM | POA: Diagnosis not present

## 2019-10-12 DIAGNOSIS — F329 Major depressive disorder, single episode, unspecified: Secondary | ICD-10-CM | POA: Diagnosis not present

## 2019-10-12 DIAGNOSIS — Z96651 Presence of right artificial knee joint: Secondary | ICD-10-CM | POA: Diagnosis not present

## 2019-10-12 DIAGNOSIS — Z466 Encounter for fitting and adjustment of urinary device: Secondary | ICD-10-CM | POA: Diagnosis not present

## 2019-10-12 DIAGNOSIS — B159 Hepatitis A without hepatic coma: Secondary | ICD-10-CM | POA: Diagnosis not present

## 2019-10-12 DIAGNOSIS — M48061 Spinal stenosis, lumbar region without neurogenic claudication: Secondary | ICD-10-CM | POA: Diagnosis not present

## 2019-10-12 DIAGNOSIS — G20 Parkinson's disease: Secondary | ICD-10-CM | POA: Diagnosis not present

## 2019-10-12 DIAGNOSIS — E785 Hyperlipidemia, unspecified: Secondary | ICD-10-CM | POA: Diagnosis not present

## 2019-10-12 DIAGNOSIS — I872 Venous insufficiency (chronic) (peripheral): Secondary | ICD-10-CM | POA: Diagnosis not present

## 2019-10-12 DIAGNOSIS — Z87891 Personal history of nicotine dependence: Secondary | ICD-10-CM | POA: Diagnosis not present

## 2019-10-12 DIAGNOSIS — I10 Essential (primary) hypertension: Secondary | ICD-10-CM | POA: Diagnosis not present

## 2019-10-12 DIAGNOSIS — N3281 Overactive bladder: Secondary | ICD-10-CM | POA: Diagnosis not present

## 2019-10-12 DIAGNOSIS — I129 Hypertensive chronic kidney disease with stage 1 through stage 4 chronic kidney disease, or unspecified chronic kidney disease: Secondary | ICD-10-CM | POA: Diagnosis not present

## 2019-10-12 DIAGNOSIS — Z8546 Personal history of malignant neoplasm of prostate: Secondary | ICD-10-CM | POA: Diagnosis not present

## 2019-10-12 DIAGNOSIS — M1990 Unspecified osteoarthritis, unspecified site: Secondary | ICD-10-CM | POA: Diagnosis not present

## 2019-10-12 DIAGNOSIS — M5135 Other intervertebral disc degeneration, thoracolumbar region: Secondary | ICD-10-CM | POA: Diagnosis not present

## 2019-10-12 DIAGNOSIS — Z9181 History of falling: Secondary | ICD-10-CM | POA: Diagnosis not present

## 2019-10-12 DIAGNOSIS — G2581 Restless legs syndrome: Secondary | ICD-10-CM | POA: Diagnosis not present

## 2019-10-12 DIAGNOSIS — N189 Chronic kidney disease, unspecified: Secondary | ICD-10-CM | POA: Diagnosis not present

## 2019-10-12 NOTE — Telephone Encounter (Signed)
Received a message from PCP office that [facility] does not carry Alternating Pressure Pads. Verified with [facility] that they do have item available. Message sent to PCP office with [facility] fax number

## 2019-10-17 ENCOUNTER — Ambulatory Visit: Payer: Self-pay | Admitting: *Deleted

## 2019-10-17 DIAGNOSIS — G20 Parkinson's disease: Secondary | ICD-10-CM

## 2019-10-17 NOTE — Chronic Care Management (AMB) (Signed)
  Chronic Care Management   Note  10/17/2019 Name: TRUNG ARDITO MRN: [id number] DOB: June 02, 1941  Outreach to central pharmacy for help with Rytary patience assistance program.   Follow up plan: [place] team follow up.   [place] Management Coordinator Direct Dial:  [phone]  Fax: [phone]

## 2019-10-18 DIAGNOSIS — G20 Parkinson's disease: Secondary | ICD-10-CM | POA: Diagnosis not present

## 2019-10-18 DIAGNOSIS — R262 Difficulty in walking, not elsewhere classified: Secondary | ICD-10-CM | POA: Diagnosis not present

## 2019-10-18 DIAGNOSIS — M6281 Muscle weakness (generalized): Secondary | ICD-10-CM | POA: Diagnosis not present

## 2019-10-18 DIAGNOSIS — M9711XS Periprosthetic fracture around internal prosthetic right knee joint, sequela: Secondary | ICD-10-CM | POA: Diagnosis not present

## 2019-10-18 NOTE — Telephone Encounter (Signed)
Pt calling again - would like a call back about the RX for a mattress.

## 2019-10-19 ENCOUNTER — Telehealth: Payer: Self-pay | Admitting: *Deleted

## 2019-10-19 DIAGNOSIS — N3281 Overactive bladder: Secondary | ICD-10-CM | POA: Diagnosis not present

## 2019-10-19 DIAGNOSIS — Z96651 Presence of right artificial knee joint: Secondary | ICD-10-CM | POA: Diagnosis not present

## 2019-10-19 DIAGNOSIS — N189 Chronic kidney disease, unspecified: Secondary | ICD-10-CM | POA: Diagnosis not present

## 2019-10-19 DIAGNOSIS — I872 Venous insufficiency (chronic) (peripheral): Secondary | ICD-10-CM | POA: Diagnosis not present

## 2019-10-19 DIAGNOSIS — M48061 Spinal stenosis, lumbar region without neurogenic claudication: Secondary | ICD-10-CM | POA: Diagnosis not present

## 2019-10-19 DIAGNOSIS — E785 Hyperlipidemia, unspecified: Secondary | ICD-10-CM | POA: Diagnosis not present

## 2019-10-19 DIAGNOSIS — Z8546 Personal history of malignant neoplasm of prostate: Secondary | ICD-10-CM | POA: Diagnosis not present

## 2019-10-19 DIAGNOSIS — Z87891 Personal history of nicotine dependence: Secondary | ICD-10-CM | POA: Diagnosis not present

## 2019-10-19 DIAGNOSIS — Z9181 History of falling: Secondary | ICD-10-CM | POA: Diagnosis not present

## 2019-10-19 DIAGNOSIS — F329 Major depressive disorder, single episode, unspecified: Secondary | ICD-10-CM | POA: Diagnosis not present

## 2019-10-19 DIAGNOSIS — Z7984 Long term (current) use of oral hypoglycemic drugs: Secondary | ICD-10-CM | POA: Diagnosis not present

## 2019-10-19 DIAGNOSIS — I10 Essential (primary) hypertension: Secondary | ICD-10-CM | POA: Diagnosis not present

## 2019-10-19 DIAGNOSIS — M1990 Unspecified osteoarthritis, unspecified site: Secondary | ICD-10-CM | POA: Diagnosis not present

## 2019-10-19 DIAGNOSIS — R338 Other retention of urine: Secondary | ICD-10-CM | POA: Diagnosis not present

## 2019-10-19 DIAGNOSIS — G20 Parkinson's disease: Secondary | ICD-10-CM | POA: Diagnosis not present

## 2019-10-19 DIAGNOSIS — M5135 Other intervertebral disc degeneration, thoracolumbar region: Secondary | ICD-10-CM | POA: Diagnosis not present

## 2019-10-19 DIAGNOSIS — I129 Hypertensive chronic kidney disease with stage 1 through stage 4 chronic kidney disease, or unspecified chronic kidney disease: Secondary | ICD-10-CM | POA: Diagnosis not present

## 2019-10-19 DIAGNOSIS — B159 Hepatitis A without hepatic coma: Secondary | ICD-10-CM | POA: Diagnosis not present

## 2019-10-19 DIAGNOSIS — G2581 Restless legs syndrome: Secondary | ICD-10-CM | POA: Diagnosis not present

## 2019-10-19 DIAGNOSIS — Z466 Encounter for fitting and adjustment of urinary device: Secondary | ICD-10-CM | POA: Diagnosis not present

## 2019-10-19 NOTE — Telephone Encounter (Signed)
Patient called to discuss complications with foley not draining since change this morning, denies pain. He has only had two cups of water. Home health changed catheter this morning. Advised to call home health to assess and call back. Attempted to reach patient several times unable, to verify homehealth reached.

## 2019-10-20 ENCOUNTER — Other Ambulatory Visit: Payer: Self-pay

## 2019-10-20 ENCOUNTER — Telehealth: Payer: Self-pay

## 2019-10-20 ENCOUNTER — Encounter: Payer: Self-pay | Admitting: Physician Assistant

## 2019-10-20 ENCOUNTER — Ambulatory Visit: Payer: PPO | Admitting: Pharmacist

## 2019-10-20 ENCOUNTER — Ambulatory Visit (INDEPENDENT_AMBULATORY_CARE_PROVIDER_SITE_OTHER): Payer: PPO | Admitting: Physician Assistant

## 2019-10-20 VITALS — BP 98/61 | HR 85 | Temp 98.8°F | Ht 71.0 in | Wt 165.0 lb

## 2019-10-20 DIAGNOSIS — R339 Retention of urine, unspecified: Secondary | ICD-10-CM

## 2019-10-20 DIAGNOSIS — R102 Pelvic and perineal pain: Secondary | ICD-10-CM

## 2019-10-20 LAB — URINALYSIS, COMPLETE
Bilirubin, UA: NEGATIVE
Glucose, UA: NEGATIVE
Ketones, UA: NEGATIVE
Nitrite, UA: POSITIVE — AB
Specific Gravity, UA: 1.015 (ref 1.005–1.030)
Urobilinogen, Ur: 0.2 mg/dL (ref 0.2–1.0)
pH, UA: 5.5 (ref 5.0–7.5)

## 2019-10-20 LAB — MICROSCOPIC EXAMINATION: WBC, UA: >30 /hpf — AB (ref 0–5)

## 2019-10-20 MED ORDER — CEFDINIR 300 MG PO CAPS: 300.0000 mg | ORAL_CAPSULE | Freq: Two times a day (BID) | ORAL | 0 refills | Status: AC

## 2019-10-20 MED ORDER — CEFTRIAXONE SODIUM 1 G IJ SOLR
1.0000 g | Freq: Once | INTRAMUSCULAR | Status: AC
  Administered 2019-10-20: 1 g via INTRAMUSCULAR

## 2019-10-20 NOTE — Telephone Encounter (Signed)
Copied from [place] [phone]. Topic: General - Other >> Oct 20, 2019  2:15 PM Rayann Heman wrote: Elonda Husky calling from [facility] called and stated that she would like a call back from the office to discuss an order that was placed for compression wrap. Please advise

## 2019-10-20 NOTE — Telephone Encounter (Signed)
The original order that was faxed was denied due to [facility] not having this type of mattress. Was waiting on palliative care nurse to contact us with information on where to resend the order. Contacted Amy Olena Heckle for update on location to refax order for mattress. Per Amy we will send fax to [place]. Contacted [facility] [phone] concerning order and they are sending [place] a fax with required information for order.

## 2019-10-20 NOTE — Progress Notes (Addendum)
10/20/2019 11:06 AM   Roger Rodriguez 1940-09-13 [id number]  CC: Gross hematuria, fever  HPI: Roger Rodriguez is a 79 y.o. 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    Foley catheter was exchanged by home health yesterday morning.  Patient contacted the office to report low urinary output in Foley catheter thereafter.  He subsequently developed a fever overnight of 101F, temp 98.33F in clinic this morning.  He has not taken any medications to treat his fever.  Patient is accompanied today by his son, who contributes to HPI.  Per son, patient has likely been intermittently febrile for several days.  Overall, patient reports feeling weak and fatigued with suprapubic tenderness.  In-office UA today positive for 2+ blood, 1+ protein, nitrites, and 3+ leukocyte esterase; urine microscopy with >30 WBCs/HPF, 3-10 RBCs/HPF, and many bacteria.  PMH: Past Medical History:  Diagnosis Date  . Allergy    allergic rhinitis  . Arthritis    OA  . Chronic kidney disease    stones  . Depression   . Family history of adverse reaction to anesthesia   . Hematuria   . History of kidney stones   . Hyperlipidemia   . Hypertension    no meds since parkinsons meds started  . Incontinence of urine   . Inguinal hernia   . Neuromuscular disorder ([place])    parkinsons  . Overactive bladder   . Parkinson's disease ([place])   . Prostate cancer ([place])   . Rhinitis, allergic   . Shortness of breath dyspnea    doe secondary to parkinsons    Surgical History: Past Surgical History:  Procedure Laterality Date  . BACK SURGERY  2014  . CYSTOSCOPY WITH STENT PLACEMENT Right 01/13/2016   Procedure: CYSTOSCOPY WITH STENT PLACEMENT;  Surgeon: Hollice Espy, MD;  Location: [facility] ORS;  Service: Urology;  Laterality: Right;  .  CYSTOSCOPY/URETEROSCOPY/HOLMIUM LASER/STENT PLACEMENT Left 11/20/2015   Procedure: CYSTOSCOPY/URETEROSCOPY/HOLMIUM LASER/STENT PLACEMENT;  Surgeon: Hollice Espy, MD;  Location: [facility] ORS;  Service: Urology;  Laterality: Left;  . EYE SURGERY    . FRACTURE SURGERY      ankle   . H/O arthroscopy of right knee  2015  . H/O radical protatectomy    . HERNIA REPAIR    . JOINT REPLACEMENT     right knee  . LITHOTRIPSY  2010  . Rotator Cuff tear  1993   Repaired  . TOTAL KNEE REVISION Right 03/17/2018   Procedure: TOTAL KNEE REVISION;  Surgeon: Lovell Sheehan, MD;  Location: [facility] ORS;  Service: Orthopedics;  Laterality: Right;  . TOTAL KNEE REVISION Right 06/08/2018   Procedure: TOTAL KNEE REVISION;  Surgeon: Lovell Sheehan, MD;  Location: [facility] ORS;  Service: Orthopedics;  Laterality: Right;  . URETEROSCOPY WITH HOLMIUM LASER LITHOTRIPSY  2002?  [name] URETEROSCOPY WITH HOLMIUM LASER LITHOTRIPSY Right 01/13/2016   Procedure: URETEROSCOPY WITH HOLMIUM LASER LITHOTRIPSY;  Surgeon: Hollice Espy, MD;  Location: [facility] ORS;  Service: Urology;  Laterality: Right;  [name] VASECTOMY     History    Home Medications:  Allergies as of 10/20/2019      Reactions   Sulfa Antibiotics Swelling   Lip swelling      Medication List       Accurate as of October 20, 2019 11:06 AM. If you have any questions, ask your nurse or doctor.  ARTIFICIAL TEARS OP Place 1 drop into both eyes 4 (four) times daily as needed (for dry eyes).   budesonide 32 MCG/ACT nasal spray Commonly known as: RHINOCORT AQUA USE 1 PUFF IN EACH NOSTRIL EVERY DAY AS NEEDED What changed: See the new instructions.   busPIRone 7.5 MG tablet Commonly known as: BUSPAR   celecoxib 100 MG capsule Commonly known as: CELEBREX TAKE 1 CAPSULE BY MOUTH TWICE A DAY   cholecalciferol 1000 units tablet Commonly known as: VITAMIN D Take 1,000 Units by mouth daily.   enalapril 5 MG tablet Commonly known as: VASOTEC TAKE 1 TABLET BY MOUTH EVERY  DAY   gabapentin 400 MG capsule Commonly known as: NEURONTIN Take 1,200 mg by mouth 3 (three) times daily.   glucose blood test strip Commonly known as: Quintet AC Blood Glucose Test Use as instructed   insulin aspart 100 UNIT/ML injection Commonly known as: novoLOG Inject 0-15 Units into the skin 3 (three) times daily with meals.   loratadine 10 MG tablet Commonly known as: CLARITIN Take 10 mg by mouth daily.   metFORMIN 500 MG tablet Commonly known as: GLUCOPHAGE TAKE 1 TABLET (500 MG TOTAL) BY MOUTH 2 (TWO) TIMES DAILY WITH A MEAL.   mirabegron ER 25 MG Tb24 tablet Commonly known as: Myrbetriq Myrbetriq 25 mg tablet,extended release  TAKE 1 TABLET (25 MG TOTAL) 2 (TWO) TIMES DAILY BY MOUTH.   multivitamin with minerals Tabs tablet Take 1 tablet by mouth daily.   polyethylene glycol 17 g packet Commonly known as: MIRALAX / GLYCOLAX Take 17 g by mouth daily.   pramipexole 0.5 MG tablet Commonly known as: MIRAPEX Take 2 tablets (1 mg total) by mouth at bedtime. What changed:   how much to take  when to take this  additional instructions   Rytary 23.75-95 MG Cpcr Generic drug: Carbidopa-Levodopa ER Take 4 capsules by mouth 4 (four) times daily.   vitamin B-12 1000 MCG tablet Commonly known as: CYANOCOBALAMIN Take 1,000 mcg by mouth daily.   vitamin C 500 MG tablet Commonly known as: ASCORBIC ACID Take 500 mg by mouth daily.   vitamin E 180 MG (400 UNITS) capsule Generic drug: vitamin E Take 400 Units by mouth daily.      Allergies:  Allergies  Allergen Reactions  . Sulfa Antibiotics Swelling    Lip swelling   Family History: Family History  Problem Relation Age of Onset  . Dementia Mother   . Cerebral aneurysm Father   . Bladder Cancer Paternal Uncle   . Liver cancer Sister   . Prostate cancer Neg Hx   . Kidney cancer Neg Hx     Social History:   reports that he quit smoking about 50 years ago. His smoking use included cigars. He quit  smokeless tobacco use about 15 years ago.  His smokeless tobacco use included chew. He reports that he does not drink alcohol or use drugs.  Physical Exam: BP 98/61   Pulse 85   Temp 98.8 F (37.1 C)   Ht 5\' 11"  (1.803 m)   Wt 165 lb (74.8 kg)   SpO2 94%   BMI 23.01 kg/m   Constitutional:  Alert and oriented, no acute distress, nontoxic appearing HEENT: , AT Cardiovascular: No clubbing, cyanosis, or edema Respiratory: Normal respiratory effort, no increased work of breathing GU: Foley catheter in place draining yellow, purulent urine.  Suprapubic tenderness. Skin: No rashes, bruises or suspicious lesions Neurologic: Requires heavy assist for transfer Psychiatric: Normal mood and affect  Laboratory Data: Results for orders placed or performed in visit on 10/20/19  Microscopic Examination   URINE  Result Value Ref Range   WBC, UA >30 (A) 0 - 5 /hpf   RBC 3-10 (A) 0 - 2 /hpf   Epithelial Cells (non renal) 0-10 0 - 10 /hpf   Bacteria, UA Many (A) None seen/Few  Urinalysis, Complete  Result Value Ref Range   Specific Gravity, UA 1.015 1.005 - 1.030   pH, UA 5.5 5.0 - 7.5   Color, UA Yellow Yellow   Appearance Ur Cloudy (A) Clear   Leukocytes,UA 3+ (A) Negative   Protein,UA 1+ (A) Negative/Trace   Glucose, UA Negative Negative   Ketones, UA Negative Negative   RBC, UA 2+ (A) Negative   Bilirubin, UA Negative Negative   Urobilinogen, Ur 0.2 0.2 - 1.0 mg/dL   Nitrite, UA Positive (A) Negative   Microscopic Examination See below:    Assessment & Plan:   1. Acute suprapubic pain 79 year old male with a history of Parkinson's disease and urinary retention managed by chronic indwelling Foley catheter presents with concern for febrile UTI.  He is afebrile in clinic this morning, borderline hypotensive, but in no acute distress.  UA grossly infected, will send for culture for further evaluation.   I exchanged his Foley catheter in clinic today to obtain a clean urine sample for  analysis.  See separate procedure note for details.  Urine sample obtained from new catheter.   Elected to attempt management of likely UTI on an outpatient basis.  I administered 1 dose of ceftriaxone in clinic and prescribed cefdinir 300 mg twice daily x10 days.  Counseled patient to proceed to the ED over the weekend if he develops worsening of symptoms, including fever, chills, nausea, vomiting, or worsening pain.  He expressed understanding. - CULTURE, URINE COMPREHENSIVE - Urinalysis, Complete - cefTRIAXone (ROCEPHIN) injection 1 g - cefdinir (OMNICEF) 300 MG capsule; Take 1 capsule (300 mg total) by mouth 2 (two) times daily for 10 days.  Dispense: 20 capsule; Refill: 0   2. Urinary retention Patient would prefer to proceed with monthly catheter exchanges in our clinic.  I am amenable with this plan.  We will schedule him for monthly catheter changes here moving forward.  Return in about 4 weeks (around 11/17/2019) for Catheter exchange.  Debroah Loop, PA-C  [facility] [address] [place], [place] [address] [phone]

## 2019-10-20 NOTE — Telephone Encounter (Signed)
Patient following up in office this morning

## 2019-10-20 NOTE — Progress Notes (Signed)
Cath Change/ Replacement  Patient is present today for a catheter change due to urinary retention.  48ml of water was removed from the balloon, a [id number] silicone foley cath was removed without difficulty.  Patient was cleaned and prepped in a sterile fashion with betadine and 2% lidocaine jelly was instilled into the urethra. A 16 FR foley cath was replaced into the bladder no complications were noted Urine return was noted 138ml and urine was yellow and cloudy. The balloon was filled with 69ml of sterile water. A leg bag was attached for drainage.  A night bag was also given to the patient and patient was given instruction on how to change from one bag to another. Patient was given proper instruction on catheter care.    Performed by: Debroah Loop, PA-C

## 2019-10-23 NOTE — Telephone Encounter (Signed)
Returned care to [facility]. [facility] is needing wound information in order to send compression wrap to patient. [facility] contact info given.

## 2019-10-24 ENCOUNTER — Telehealth: Payer: Self-pay

## 2019-10-24 NOTE — Telephone Encounter (Signed)
Received call from Jullisa from [facility]. Karn Pickler shared that she received a call from PCP office regarding compression wraps. Karn Pickler informed this RN that documentation showing need for compression wraps including location wound that is between knee and ankle, including measurements and wound characteristics. [place] provided fax number [phone]. Will speak with Palliative NP

## 2019-10-27 ENCOUNTER — Telehealth: Payer: Self-pay | Admitting: *Deleted

## 2019-10-27 LAB — CULTURE, URINE COMPREHENSIVE

## 2019-10-27 NOTE — Telephone Encounter (Signed)
Notified patient as advised, patient verbalized understanding.  

## 2019-10-27 NOTE — Telephone Encounter (Signed)
Patient would like to know results from his culture.

## 2019-10-27 NOTE — Telephone Encounter (Signed)
Please contact the patient and inform him that he is on the right antibiotics per his urine culture results.  No change necessary.

## 2019-10-31 ENCOUNTER — Telehealth: Payer: Self-pay

## 2019-10-31 NOTE — Telephone Encounter (Signed)
Copied from [place] [phone]. Topic: General - Other >> Oct 31, 2019 12:27 PM Roger Rodriguez, [place] C wrote: Reason for CRM: pt called in to follow up on Rx for leg wrap and a special mattress. Pt says this was discussed last week.   Please call pt back to assist.

## 2019-10-31 NOTE — Telephone Encounter (Signed)
Patient called today stating that he has finished his abx but still believes he has an infection and would like a refill on abx. He denies fever, chills, nausea or vomiting but states he is having spasm pain at tube site and burning with urgency. As well as some mucus at site. Please advise

## 2019-10-31 NOTE — Telephone Encounter (Signed)
Please contact the patient and inform him that I do not believe he continues to have an infection.  It sounds like he is describing bladder spasms, which are common in patients with chronic Foley catheters.  I recommend that he restart Myrbetriq at this time for management of his bladder spasms.  If he does not have any more of this medication at home, please let me know and we can prescribe it.

## 2019-10-31 NOTE — Telephone Encounter (Signed)
Spoke with Stanton Kidney from Wound care supplier to provide update that patient does not have wounds and the need is for the velcro covers that go over compression hoses. Stanton Kidney shared that this is not covered by insurance as patient does not have wounds. Amy NP updated.

## 2019-11-01 NOTE — Telephone Encounter (Signed)
Contacted patient as advised. Patient states he has a few weeks left of Myrbetriq samples that he would restart to help with the bladder spasms. I told him to call back when his samples were close to empty to obtain a prescription. Patient verbalized understanding.

## 2019-11-01 NOTE — Telephone Encounter (Signed)
Scheduled patient for face to face telephone visit.

## 2019-11-02 ENCOUNTER — Telehealth: Payer: Self-pay

## 2019-11-02 ENCOUNTER — Ambulatory Visit (INDEPENDENT_AMBULATORY_CARE_PROVIDER_SITE_OTHER): Payer: PPO | Admitting: Family Medicine

## 2019-11-02 DIAGNOSIS — T8450XD Infection and inflammatory reaction due to unspecified internal joint prosthesis, subsequent encounter: Secondary | ICD-10-CM | POA: Diagnosis not present

## 2019-11-02 DIAGNOSIS — R627 Adult failure to thrive: Secondary | ICD-10-CM

## 2019-11-02 DIAGNOSIS — G20 Parkinson's disease: Secondary | ICD-10-CM

## 2019-11-02 DIAGNOSIS — I10 Essential (primary) hypertension: Secondary | ICD-10-CM | POA: Diagnosis not present

## 2019-11-02 NOTE — Progress Notes (Signed)
Virtual telephone visit    Virtual Visit via Telephone Note   This visit type was conducted due to national recommendations for restrictions regarding the COVID-19 Pandemic (e.g. social distancing) in an effort to limit this patient's exposure and mitigate transmission in our community. Due to his co-morbid illnesses, this patient is at least at moderate risk for complications without adequate follow up. This format is felt to be most appropriate for this patient at this time. The patient did not have access to video technology or had technical difficulties with video requiring transitioning to audio format only (telephone). Physical exam was limited to content and character of the telephone converstion.    Patient location: Home Provider location: Office   Patient: Roger Rodriguez   DOB: 10/05/1940   79 y.o. Male  MRN: [id number] Visit Date: 11/02/2019  Today's Provider: Wilhemena Durie, MD  Subjective:   No chief complaint on file.  HPI Face to Face visit for medical supplies.  This is a phone call as patient is unable to operate computer and he is essentially homebound as he can only transfer from bed to chair.  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  Again, he is unable to ambulate.  He needs compression wraps for his legs and he needs flow air mattress to prevent skin breakdown.  He is in the bed most of the time.  He actually feels fairly stable but knows that he has progressive chronic diseases.  Does not seem depressed today.    Past Medical History:  Diagnosis Date  . Allergy    allergic rhinitis  . Arthritis    OA  . Chronic kidney disease    stones  . Depression   . Family history of adverse reaction to anesthesia   . Hematuria   . History of kidney stones   . Hyperlipidemia   . Hypertension    no meds since parkinsons meds started  . Incontinence of urine   . Inguinal hernia   . Neuromuscular  disorder ([place])    parkinsons  . Overactive bladder   . Parkinson's disease ([place])   . Prostate cancer ([place])   . Rhinitis, allergic   . Shortness of breath dyspnea    doe secondary to parkinsons      Medications: Outpatient Medications Prior to Visit  Medication Sig  . budesonide (RHINOCORT AQUA) 32 MCG/ACT nasal spray USE 1 PUFF IN EACH NOSTRIL EVERY DAY AS NEEDED (Patient taking differently: Place 1 spray into both nostrils daily as needed for allergies. )  . busPIRone (BUSPAR) 7.5 MG tablet   . celecoxib (CELEBREX) 100 MG capsule TAKE 1 CAPSULE BY MOUTH TWICE A DAY  . cholecalciferol (VITAMIN D) 1000 units tablet Take 1,000 Units by mouth daily.   . enalapril (VASOTEC) 5 MG tablet TAKE 1 TABLET BY MOUTH EVERY DAY  . gabapentin (NEURONTIN) 400 MG capsule Take 1,200 mg by mouth 3 (three) times daily.   [name] glucose blood (QUINTET AC BLOOD GLUCOSE TEST) test strip Use as instructed  . Hypromellose (ARTIFICIAL TEARS OP) Place 1 drop into both eyes 4 (four) times daily as needed (for dry eyes).   . insulin aspart (NOVOLOG) 100 UNIT/ML injection Inject 0-15 Units into the skin 3 (three) times daily with meals.  [name] loratadine (CLARITIN) 10 MG tablet Take 10 mg by mouth daily.  . metFORMIN (GLUCOPHAGE) 500 MG tablet TAKE 1 TABLET (500 MG TOTAL) BY MOUTH 2 (TWO)  TIMES DAILY WITH A MEAL.  . mirabegron ER (MYRBETRIQ) 25 MG TB24 tablet Myrbetriq 25 mg tablet,extended release  TAKE 1 TABLET (25 MG TOTAL) 2 (TWO) TIMES DAILY BY MOUTH. (Patient not taking: Reported on 09/12/2019)  . Multiple Vitamin (MULTIVITAMIN WITH MINERALS) TABS tablet Take 1 tablet by mouth daily.  . polyethylene glycol (MIRALAX / GLYCOLAX) packet Take 17 g by mouth daily.  . pramipexole (MIRAPEX) 0.5 MG tablet Take 2 tablets (1 mg total) by mouth at bedtime. (Patient taking differently: Take 0.25-0.5 mg by mouth See admin instructions. Take 0.25 mg by mouth twice daily. Take 0.5 mg by mouth at bedtime.)  . RYTARY 23.75-95 MG CPCR Take  4 capsules by mouth 4 (four) times daily.   . vitamin B-12 (CYANOCOBALAMIN) 1000 MCG tablet Take 1,000 mcg by mouth daily.  . vitamin C (ASCORBIC ACID) 500 MG tablet Take 500 mg by mouth daily.  . vitamin E (VITAMIN E) 400 UNIT capsule Take 400 Units by mouth daily.    No facility-administered medications prior to visit.    Review of Systems       Objective:    There were no vitals taken for this visit. BP Readings from Last 3 Encounters:  10/20/19 98/61  08/10/19 120/71  07/12/19 (!) 155/88   Wt Readings from Last 3 Encounters:  10/20/19 165 lb (74.8 kg)  08/10/19 210 lb (95.3 kg)  07/12/19 200 lb (90.7 kg)          Assessment & Plan:    1. Idiopathic Parkinson's disease ([place])   2. Infection of prosthetic joint, subsequent encounter   3. Essential (primary) hypertension   4. Failure to thrive in adult Patient is sent prescriptions for leg wraps and for airflow mattress.  The medical person there said he had no skin breakdown or sacral decubitus at this time.  No heel breakdown.  This was relayed to me by the patient. He has had recent problems with significant urinary retention.  Palliative care is involved.  No follow-ups on file.    I discussed the assessment and treatment plan with the patient. The patient was provided an opportunity to ask questions and all were answered. The patient agreed with the plan and demonstrated an understanding of the instructions.   The patient was advised to call back or seek an in-person evaluation if the symptoms worsen or if the condition fails to improve as anticipated.  I provided 20 minutes of non-face-to-face time during this encounter.    Ashraf Mesta Cranford Mon, MD [facility] [phone] (phone) [phone] (fax)  [place]

## 2019-11-02 NOTE — Telephone Encounter (Signed)
Copied from [place] [id number]. Topic: General - Inquiry >> Nov 02, 2019  1:32 PM Mathis Bud wrote: Reason for CRM: Patient states office called regarding his virtual appt today. Call back [phone]

## 2019-11-06 ENCOUNTER — Telehealth: Payer: Self-pay | Admitting: Family Medicine

## 2019-11-06 NOTE — Telephone Encounter (Signed)
Tica from [facility] is calling to see if the patient has be seen during the cerfication period ( 08/21/19-10/19/19) for approval of home health order. [phone]

## 2019-11-07 ENCOUNTER — Telehealth: Payer: Self-pay

## 2019-11-07 NOTE — Telephone Encounter (Signed)
LMOVM for Tica to return call.

## 2019-11-07 NOTE — Telephone Encounter (Signed)
Error

## 2019-11-08 NOTE — Telephone Encounter (Signed)
Spoke to Roger Rodriguez yesterday and advised patient had face to face telephone visit 11/02/2019.

## 2019-11-14 ENCOUNTER — Telehealth: Payer: Self-pay

## 2019-11-14 ENCOUNTER — Other Ambulatory Visit: Payer: PPO | Admitting: Adult Health Nurse Practitioner

## 2019-11-14 ENCOUNTER — Other Ambulatory Visit: Payer: Self-pay

## 2019-11-14 DIAGNOSIS — Z515 Encounter for palliative care: Secondary | ICD-10-CM

## 2019-11-14 DIAGNOSIS — G20 Parkinson's disease: Secondary | ICD-10-CM | POA: Diagnosis not present

## 2019-11-14 NOTE — Telephone Encounter (Signed)
Done

## 2019-11-14 NOTE — Telephone Encounter (Signed)
Copied from [place] [id number]. Topic: General - Call Back - No Documentation >> Nov 14, 2019 10:52 AM Erick Blinks wrote: Reason for CRM: Pt called requesting a call back from clinic in regard to his Rx discussed in his last virtual visit. Please advise [phone]

## 2019-11-14 NOTE — Progress Notes (Signed)
[place] [place] Consult Note Telephone: [phone]  Fax: [phone]  PATIENT NAME: Roger Rodriguez DOB: [birth year] MRN: [id number]  PRIMARY CARE PROVIDER:   [name]., MD  REFERRING PROVIDER:  Gayland Curry, [place], neurology  RESPONSIBLE PARTY:   Self H: [phone] C: [phone] Jeanmarc Rodriguez, son [phone] Roger Rodriguez, son [phone] Roger Rodriguez, caregiver [phone]       RECOMMENDATIONS and PLAN:  1.  Advanced care planning. Patient is a DNR  2.  Parkinson's disease.  Patient followed by Dr. Manuella Ghazi.  Patient is wheelchair bound and spends most of day sitting in a chair or lying in bed. Patient is on Rytary 61.25/245 mg QID and Rytary 23.75/95 mg 2 tabs QID.  He does not report any increased tremors or freezing episodes.  Continue follow up and recommendations by neurology  3.  Urinary retention.  Patient is being seen by urology.  Home health nurse changed his foley catheter on 10/19/19 and he reported to urology that he was having decreased urinary output.  He was seen in urology office on 10/20/19 and foley changed in office and UA showed UTI.  He was treated for UTI and is to have his foley changed in office monthly instead at home.  Continue recommendations and follow up with urology  4.  Patient had concerns about his feet.  He has no feeling to right foot on top or bottom sensation in tact to left foot.  He has 1+ pedal pulses bilaterally.  He has purplish reddish skin to feet, ankles, and lower part of lower legs.  He states that the coloring goes away when he wears his compression socks and elevates his legs.  Caregiver makes sure to keep his feet clean and dry and moisturizes with lotion.  Patient is doing everything he can to keep his feet healthy.  Encouraged to keep doing his current regimen.  Palliative will continue to monitor for symptom management/decline and make recommendations as needed. Have next  appointment in 6 weeks   I spent 80 minutes providing this consultation,  from 2:00 to 3:20 including time with patient/family, chart review, provider coordination, and documentation . More than 50% of the time in this consultation was spent coordinating communication.   HISTORY OF PRESENT ILLNESS:  Roger Rodriguez is a 79 y.o. year old male with multiple medical problems including Parkinson's disease, CKD, OA, HTN, depression. Palliative Care was asked to help address goals of care.   CODE STATUS: DNR  PPS: 40% HOSPICE ELIGIBILITY/DIAGNOSIS: TBD  PHYSICAL EXAM:  BP 118/62  HR 79  O2 97% on RA General: sitting in wheelchair in NAD Cardiovascular: regular rate and rhythm; weak pedal pulses Pulmonary: lung sounds clear; normal respiratory effort Abdomen: soft, nontender, + bowel sounds [id number] catheter in place with clear yellow urine Extremities:trace edema to feet with more to right foot, no joint deformities Skin:  Lower part of lower legs feet and and ankles are purplish red.  He states that this goes away when wearing his compression socks and elevation.  Legs and feet without wounds.  No boggy spots on heels. Neurological:Patient is wheelchair bound; has some forgetfulness  PAST MEDICAL HISTORY:  Past Medical History:  Diagnosis Date  . Allergy    allergic rhinitis  . Arthritis    OA  . Chronic kidney disease    stones  . Depression   . Family history of adverse reaction to anesthesia   . Hematuria   .  History of kidney stones   . Hyperlipidemia   . Hypertension    no meds since parkinsons meds started  . Incontinence of urine   . Inguinal hernia   . Neuromuscular disorder ([place])    parkinsons  . Overactive bladder   . Parkinson's disease ([place])   . Prostate cancer ([place])   . Rhinitis, allergic   . Shortness of breath dyspnea    doe secondary to parkinsons    SOCIAL HX:  Social History   Tobacco Use  . Smoking status: Former Smoker    Types: Cigars    Quit  date: 11/11/1968    Years since quitting: 51.0  . Smokeless tobacco: Former Systems developer    Types: Chew    Quit date: 11/12/2003  . Tobacco comment: Occassionally, also chewed tobacco till 2005. Quit smoking 1973  Substance Use Topics  . Alcohol use: No    Alcohol/week: 0.0 standard drinks    ALLERGIES:  Allergies  Allergen Reactions  . Sulfa Antibiotics Swelling    Lip swelling     PERTINENT MEDICATIONS:  Outpatient Encounter Medications as of 11/14/2019  Medication Sig  . budesonide (RHINOCORT AQUA) 32 MCG/ACT nasal spray USE 1 PUFF IN EACH NOSTRIL EVERY DAY AS NEEDED (Patient taking differently: Place 1 spray into both nostrils daily as needed for allergies. )  . busPIRone (BUSPAR) 7.5 MG tablet   . celecoxib (CELEBREX) 100 MG capsule TAKE 1 CAPSULE BY MOUTH TWICE A DAY  . cholecalciferol (VITAMIN D) 1000 units tablet Take 1,000 Units by mouth daily.   . enalapril (VASOTEC) 5 MG tablet TAKE 1 TABLET BY MOUTH EVERY DAY  . gabapentin (NEURONTIN) 400 MG capsule Take 1,200 mg by mouth 3 (three) times daily.   [name] glucose blood (QUINTET AC BLOOD GLUCOSE TEST) test strip Use as instructed  . Hypromellose (ARTIFICIAL TEARS OP) Place 1 drop into both eyes 4 (four) times daily as needed (for dry eyes).   . insulin aspart (NOVOLOG) 100 UNIT/ML injection Inject 0-15 Units into the skin 3 (three) times daily with meals.  [name] loratadine (CLARITIN) 10 MG tablet Take 10 mg by mouth daily.  . metFORMIN (GLUCOPHAGE) 500 MG tablet TAKE 1 TABLET (500 MG TOTAL) BY MOUTH 2 (TWO) TIMES DAILY WITH A MEAL.  . mirabegron ER (MYRBETRIQ) 25 MG TB24 tablet Myrbetriq 25 mg tablet,extended release  TAKE 1 TABLET (25 MG TOTAL) 2 (TWO) TIMES DAILY BY MOUTH. (Patient not taking: Reported on 09/12/2019)  . Multiple Vitamin (MULTIVITAMIN WITH MINERALS) TABS tablet Take 1 tablet by mouth daily.  . polyethylene glycol (MIRALAX / GLYCOLAX) packet Take 17 g by mouth daily.  . pramipexole (MIRAPEX) 0.5 MG tablet Take 2 tablets (1 mg  total) by mouth at bedtime. (Patient taking differently: Take 0.25-0.5 mg by mouth See admin instructions. Take 0.25 mg by mouth twice daily. Take 0.5 mg by mouth at bedtime.)  . RYTARY 23.75-95 MG CPCR Take 4 capsules by mouth 4 (four) times daily.   . vitamin B-12 (CYANOCOBALAMIN) 1000 MCG tablet Take 1,000 mcg by mouth daily.  . vitamin C (ASCORBIC ACID) 500 MG tablet Take 500 mg by mouth daily.  . vitamin E (VITAMIN E) 400 UNIT capsule Take 400 Units by mouth daily.    No facility-administered encounter medications on file as of 11/14/2019.     Catalino Plascencia Jenetta Downer, NP

## 2019-11-14 NOTE — Progress Notes (Signed)
Subjective:   Roger Rodriguez is a 79 y.o. male who presents for Medicare Annual/Subsequent preventive examination.    This visit is being conducted through telemedicine due to the COVID-19 pandemic. This patient has given me verbal consent via doximity to conduct this visit, patient states they are participating from their home address. Some vital signs may be absent or patient reported.    Patient identification: identified by name, DOB, and current address  Review of Systems:  N/A  Cardiac Risk Factors include: advanced age (>84men, >58 women);diabetes mellitus;hypertension;male gender     Objective:    Vitals: There were no vitals taken for this visit.  There is no height or weight on file to calculate BMI. Unable to obtain vitals due to visit being conducted via telephonically.   Advanced Directives 11/15/2019 10/10/2018 06/08/2018 06/08/2018 06/03/2018 03/17/2018 02/02/2018  Does Patient Have a Medical Advance Directive? Yes Yes Yes Yes Yes Yes Yes  Type of Paramedic of [place];Living will [place];Living will;Out of facility DNR (pink MOST or yellow form) [place];Living will [place];Living will - Healthcare Power of [place];Living will  Does patient want to make changes to medical advance directive? - No - Patient declined No - Patient declined - - No - Patient declined -  Copy of [place] in Chart? No - copy requested - Yes - validated most recent copy scanned in chart (See row information) - - No - copy requested No - copy requested    Tobacco Social History   Tobacco Use  Smoking Status Former Smoker  . Types: Cigars  . Quit date: 11/11/1968  . Years since quitting: 51.0  Smokeless Tobacco Former Systems developer  . Types: Chew  . Quit date: 11/12/2003  Tobacco Comment   Occassionally, also chewed tobacco till 2005. Quit smoking 1973     Counseling  given: Not Answered Comment: Occassionally, also chewed tobacco till 2005. Quit smoking 1973   Clinical Intake:  Pre-visit preparation completed: Yes  Pain : No/denies pain Pain Score: 0-No pain     Nutritional Risks: None Diabetes: (borderline)  How often do you need to have someone help you when you read instructions, pamphlets, or other written materials from your doctor or pharmacy?: 1 - Never  Interpreter Needed?: No  Information entered by :: [facility], LPN  Past Medical History:  Diagnosis Date  . Allergy    allergic rhinitis  . Arthritis    OA  . Chronic kidney disease    stones  . Depression   . Family history of adverse reaction to anesthesia   . Hematuria   . History of kidney stones   . Hyperlipidemia   . Hypertension    no meds since parkinsons meds started  . Incontinence of urine   . Inguinal hernia   . Neuromuscular disorder ([place])    parkinsons  . Overactive bladder   . Parkinson's disease ([place])   . Prostate cancer ([place])   . Rhinitis, allergic   . Shortness of breath dyspnea    doe secondary to parkinsons   Past Surgical History:  Procedure Laterality Date  . BACK SURGERY  2014  . CYSTOSCOPY WITH STENT PLACEMENT Right 01/13/2016   Procedure: CYSTOSCOPY WITH STENT PLACEMENT;  Surgeon: Hollice Espy, MD;  Location: [facility];  Service: Urology;  Laterality: Right;  . CYSTOSCOPY/URETEROSCOPY/HOLMIUM LASER/STENT PLACEMENT Left 11/20/2015   Procedure: CYSTOSCOPY/URETEROSCOPY/HOLMIUM LASER/STENT PLACEMENT;  Surgeon: Hollice Espy, MD;  Location: [facility];  Service: Urology;  Laterality: Left;  . EYE SURGERY    . FRACTURE SURGERY      ankle   . H/O arthroscopy of right knee  2015  . H/O radical protatectomy    . HERNIA REPAIR    . JOINT REPLACEMENT     right knee  . LITHOTRIPSY  2010  . Rotator Cuff tear  1993   Repaired  . TOTAL KNEE REVISION Right 03/17/2018   Procedure: TOTAL KNEE REVISION;  Surgeon: Lovell Sheehan, MD;  Location: [facility];   Service: Orthopedics;  Laterality: Right;  . TOTAL KNEE REVISION Right 06/08/2018   Procedure: TOTAL KNEE REVISION;  Surgeon: Lovell Sheehan, MD;  Location: [facility];  Service: Orthopedics;  Laterality: Right;  . URETEROSCOPY WITH HOLMIUM LASER LITHOTRIPSY  2002?  [name] URETEROSCOPY WITH HOLMIUM LASER LITHOTRIPSY Right 01/13/2016   Procedure: URETEROSCOPY WITH HOLMIUM LASER LITHOTRIPSY;  Surgeon: Hollice Espy, MD;  Location: [facility];  Service: Urology;  Laterality: Right;  [name] VASECTOMY     History   Family History  Problem Relation Age of Onset  . Dementia Mother   . Cerebral aneurysm Father   . Bladder Cancer Paternal Uncle   . Liver cancer Sister   . Prostate cancer Neg Hx   . Kidney cancer Neg Hx    Social History   Socioeconomic History  . Marital status: Widowed    Spouse name: Not on file  . Number of children: 2  . Years of education: Not on file  . Highest education level: Some college, no degree  Occupational History  . Occupation: retired  Tobacco Use  . Smoking status: Former Smoker    Types: Cigars    Quit date: 11/11/1968    Years since quitting: 51.0  . Smokeless tobacco: Former Systems developer    Types: Chew    Quit date: 11/12/2003  . Tobacco comment: Occassionally, also chewed tobacco till 2005. Quit smoking 1973  Substance and Sexual Activity  . Alcohol use: No    Alcohol/week: 0.0 standard drinks  . Drug use: No  . Sexual activity: Not Currently  Other Topics Concern  . Not on file  Social History Narrative  . Not on file   Social Determinants of Health   Financial Resource Strain: Low Risk   . Difficulty of Paying Living Expenses: Not hard at all  Food Insecurity: No Food Insecurity  . Worried About Charity fundraiser in the Last Year: Never true  . Ran Out of Food in the Last Year: Never true  Transportation Needs: No Transportation Needs  . Lack of Transportation (Medical): No  . Lack of Transportation (Non-Medical): No  Physical Activity: Inactive  .  Days of Exercise per Week: 0 days  . Minutes of Exercise per Session: 0 min  Stress: No Stress Concern Present  . Feeling of Stress : Only a little  Social Connections: Moderately Isolated  . Frequency of Communication with Friends and Family: More than three times a week  . Frequency of Social Gatherings with Friends and Family: More than three times a week  . Attends Religious Services: Never  . Active Member of Clubs or Organizations: No  . Attends Archivist Meetings: Never  . Marital Status: Widowed    Outpatient Encounter Medications as of 11/15/2019  Medication Sig  . budesonide (RHINOCORT AQUA) 32 MCG/ACT nasal spray USE 1 PUFF IN EACH NOSTRIL EVERY DAY AS NEEDED (Patient taking differently: Place 1 spray into  both nostrils daily as needed for allergies. )  . busPIRone (BUSPAR) 7.5 MG tablet Take 7.5 mg by mouth 2 (two) times daily. To 3 times daily  . celecoxib (CELEBREX) 100 MG capsule TAKE 1 CAPSULE BY MOUTH TWICE A DAY  . cholecalciferol (VITAMIN D) 1000 units tablet Take 1,000 Units by mouth daily.   . enalapril (VASOTEC) 5 MG tablet TAKE 1 TABLET BY MOUTH EVERY DAY (Patient taking differently: Take 2.5 mg by mouth 2 (two) times daily. )  . gabapentin (NEURONTIN) 400 MG capsule Take 400 mg by mouth 3 (three) times daily.   [name] glucose blood (QUINTET AC BLOOD GLUCOSE TEST) test strip Use as instructed  . Hypromellose (ARTIFICIAL TEARS OP) Place 1 drop into both eyes 4 (four) times daily as needed (for dry eyes).   [name] loratadine (CLARITIN) 10 MG tablet Take 10 mg by mouth daily.  . metFORMIN (GLUCOPHAGE) 500 MG tablet TAKE 1 TABLET (500 MG TOTAL) BY MOUTH 2 (TWO) TIMES DAILY WITH A MEAL.  [name] polyethylene glycol (MIRALAX / GLYCOLAX) packet Take 17 g by mouth daily as needed.   . pramipexole (MIRAPEX) 0.5 MG tablet Take 2 tablets (1 mg total) by mouth at bedtime. (Patient taking differently: Take 0.25 mg by mouth 3 (three) times daily. )  . RYTARY 23.75-95 MG CPCR Take 1  capsule by mouth 4 (four) times daily.   [name] RYTARY 61.25-245 MG CPCR Take 1 capsule by mouth 4 (four) times daily.  . vitamin B-12 (CYANOCOBALAMIN) 1000 MCG tablet Take 1,000 mcg by mouth daily.  . vitamin C (ASCORBIC ACID) 500 MG tablet Take 1,000 mg by mouth daily.   . vitamin E (VITAMIN E) 400 UNIT capsule Take 400 Units by mouth daily.   . insulin aspart (NOVOLOG) 100 UNIT/ML injection Inject 0-15 Units into the skin 3 (three) times daily with meals. (Patient not taking: Reported on 11/15/2019)  . mirabegron ER (MYRBETRIQ) 25 MG TB24 tablet Myrbetriq 25 mg tablet,extended release  TAKE 1 TABLET (25 MG TOTAL) 2 (TWO) TIMES DAILY BY MOUTH. (Patient not taking: Reported on 09/12/2019)  . Multiple Vitamin (MULTIVITAMIN WITH MINERALS) TABS tablet Take 1 tablet by mouth daily.   No facility-administered encounter medications on file as of 11/15/2019.    Activities of Daily Living In your present state of health, do you have any difficulty performing the following activities: 11/15/2019  Hearing? N  Vision? N  Difficulty concentrating or making decisions? N  Walking or climbing stairs? Y  Comment Unable to walk  Dressing or bathing? Y  Comment Has assistance dressing.  Doing errands, shopping? Y  Comment Does not drive.  Preparing Food and eating ? Y  Comment Has a caregiver that assists 24/7.  Using the Toilet? N  In the past six months, have you accidently leaked urine? Y  Comment Is catheterized at all times.  Do you have problems with loss of bowel control? N  Managing your Medications? N  Managing your Finances? N  Housekeeping or managing your Housekeeping? Y  Comment Unable to clean.  Some recent data might be hidden    Patient Care Team: [name]., MD as PCP - General (Family Medicine) Hollice Espy, MD as Consulting Physician (Urology) Vladimir Crofts, MD as Consulting Physician (Neurology) Leandrew Koyanagi, MD as Referring Physician (Ophthalmology) Sunday Corn  Mervin Kung (Neurology) Ralene Bathe, MD (Dermatology)   Assessment:   This is a routine wellness examination for Roger Rodriguez.  Exercise Activities and Dietary recommendations Current Exercise  Habits: The patient does not participate in regular exercise at present, Exercise limited by: orthopedic condition(s)  Goals      Patient Stated   . I got a letter from the [facility] again (pt-stated)     Current Barriers:  . Diminished manual dexterity . Financial Barriers  Pharmacist Clinical Goal(s):  [name] Over the next 10 days, patient will work with pharmacist to address needs related to [place] [employer]    Interventions: . Collaboration with [facility]   . Collaboration with [employer]   Patient Self Care Activities:  . Calls pharmacy for medication refills- making sure that they are billing his grant through the foundation . Reschedule visit with Dr. Manuella Ghazi  Please see past updates related to this goal by clicking on the "Past Updates" button in the selected goal      . I was recently diagnoses with Diabetes and I want to make sure I am doing what I should to get my sugars down (pt-stated)     Current Barriers:  [name] Knowledge Deficits related to basic Diabetes pathophysiology and self care/management . Inability to self care secondary to Parkinsons  Nurse Case Manager Clinical Goal(s):  [name] Over the next 90 days, patient will have a reduction in A1C as evidenced by chart review and patient verbalization  Interventions:  .  Assessed for medication compliance . Reviewed CBG log and reinforced education on how certain foods can increase blood sugars . Reviewed scheduled/upcoming provider appointments including: virtual visit with neurology on Thursday 02/07/2019 . Reinforced importance of daily CBG checks and record, calling Dr. Rosanna Randy for findings outside established parameters.    Patient Self Care Activities:  . Self administers medications as  prescribed . Attends all scheduled provider appointments . Checks blood sugars as prescribed and utilize hyper and hypoglycemia protocol as needed . Adhere to ADA diet as discussed . Patient/Caregivers will follow CDC guidelines for COVID-19 infection prevention   Please see past updates related to this goal by clicking on the "Past Updates" button in the selected goal        Other   . DIET - REDUCE PORTION SIZE     Recommend monitoring portion sizes and decreasing portions by half and continuing to eat healthy snacks in between.     . Increase water intake     Recommend increasing water intake to 4-5 glasses a day.        Fall Risk: Fall Risk  11/15/2019 07/25/2018 05/26/2018 02/02/2018 11/18/2017  Falls in the past year? 0 0 1 Yes Yes  Number falls in past yr: 0 0 1 2 or more 2 or more  Injury with Fall? 0 - 1 Yes Yes  Comment - - - damaged artifical replacement in knee and had to have sx. -  Risk Factor Category  - - - - -  Risk for fall due to : - History of fall(s);Impaired mobility;Impaired balance/gait Impaired balance/gait;History of fall(s);Impaired mobility - History of fall(s)  Follow up - Falls prevention discussed Falls evaluation completed;Education provided Falls prevention discussed Falls evaluation completed    FALL RISK PREVENTION PERTAINING TO THE HOME:  Any stairs in or around the home? No  If so, are there any without handrails? N/A  Home free of loose throw rugs in walkways, pet beds, electrical cords, etc? Yes  Adequate lighting in your home to reduce risk of falls? Yes   ASSISTIVE DEVICES UTILIZED TO PREVENT FALLS:  Life alert? No  Use  of a cane, walker or w/c? Yes  Grab bars in the bathroom? Yes  Shower chair or bench in shower? Yes  Elevated toilet seat or a handicapped toilet? Yes   TIMED UP AND GO:  Was the test performed? No .    Depression Screen PHQ 2/9 Scores 11/15/2019 09/01/2018 07/25/2018 05/26/2018  PHQ - 2 Score 1 2 1 2   PHQ- 9 Score  - 5 - 5    Cognitive Function     6CIT Screen 11/15/2019 02/02/2018 10/22/2016  What Year? 0 points 0 points 0 points  What month? 0 points 0 points 0 points  What time? 0 points 0 points 0 points  Count back from 20 0 points 0 points 0 points  Months in reverse 0 points 0 points 0 points  Repeat phrase 0 points 2 points 2 points  Total Score 0 2 2    Immunization History  Administered Date(s) Administered  . Fluad Quad(high Dose 65+) 04/05/2019  . Influenza, High Dose Seasonal PF 04/02/2015, 04/02/2016, 04/15/2017, 06/02/2018  . Influenza,inj,Quad PF,6+ Mos 04/03/2014  . Pneumococcal Conjugate-13 10/22/2016  . Pneumococcal Polysaccharide-23 04/06/2013, 04/05/2014  . Td 11/30/2006  . Zoster 04/06/2013    Qualifies for Shingles Vaccine? Yes  Zostavax completed 04/06/13. Due for Shingrix. Pt has been advised to call insurance company to determine out of pocket expense. Advised may also receive vaccine at local pharmacy or Health Dept. Verbalized acceptance and understanding.  Tdap: Although this vaccine is not a covered service during a Wellness Exam, does the patient still wish to receive this vaccine today?  No . Advised may receive this vaccine at local pharmacy or Health Dept. Aware to provide a copy of the vaccination record if obtained from local pharmacy or Health Dept. Verbalized acceptance and understanding.  Flu Vaccine: Up to date  Pneumococcal Vaccine: Completed series  Screening Tests Health Maintenance  Topic Date Due  . COVID-19 Vaccine (1) Never done  . TETANUS/TDAP  11/14/2020 (Originally 11/29/2016)  . INFLUENZA VACCINE  02/11/2020  . PNA vac Low Risk Adult  Completed   Cancer Screenings:  Colorectal Screening: No longer required.   Lung Cancer Screening: (Low Dose CT Chest recommended if Age 62-80 years, 30 pack-year currently smoking OR have quit w/in 15years.) does not qualify.   Additional Screening:  Vision Screening: Recommended annual ophthalmology  exams for early detection of glaucoma and other disorders of the eye.  Dental Screening: Recommended annual dental exams for proper oral hygiene  Community Resource Referral:  CRR required this visit?  No        Plan:  I have personally reviewed and addressed the Medicare Annual Wellness questionnaire and have noted the following in the patient's chart:  A. Medical and social history B. Use of alcohol, tobacco or illicit drugs  C. Current medications and supplements D. Functional ability and status E.  Nutritional status F.  Physical activity G. Advance directives H. List of other physicians I.  Hospitalizations, surgeries, and ER visits in previous 12 months J.  [place] such as hearing and vision if needed, cognitive and depression L. Referrals and appointments   In addition, I have reviewed and discussed with patient certain preventive protocols, quality metrics, and best practice recommendations. A written personalized care plan for preventive services as well as general preventive health recommendations were provided to patient.   Glendora Score, LPN  [id number] Nurse Health Advisor   Nurse Notes: None.

## 2019-11-14 NOTE — Telephone Encounter (Signed)
Returned call to patient. Advised both orders have been faxed for Mattress and leg compressions.

## 2019-11-14 NOTE — Telephone Encounter (Signed)
Pt called back to give a fax number to have the order for the mattress faxed to.  FAX #  [phone]

## 2019-11-15 ENCOUNTER — Ambulatory Visit (INDEPENDENT_AMBULATORY_CARE_PROVIDER_SITE_OTHER): Payer: PPO

## 2019-11-15 DIAGNOSIS — G20 Parkinson's disease: Secondary | ICD-10-CM | POA: Diagnosis not present

## 2019-11-15 DIAGNOSIS — E785 Hyperlipidemia, unspecified: Secondary | ICD-10-CM | POA: Diagnosis not present

## 2019-11-15 DIAGNOSIS — Z0000 Encounter for general adult medical examination without abnormal findings: Secondary | ICD-10-CM

## 2019-11-15 DIAGNOSIS — R0602 Shortness of breath: Secondary | ICD-10-CM | POA: Diagnosis not present

## 2019-11-15 DIAGNOSIS — I10 Essential (primary) hypertension: Secondary | ICD-10-CM | POA: Diagnosis not present

## 2019-11-15 NOTE — Patient Instructions (Signed)
Roger Rodriguez , Thank you for taking time to come for your Medicare Wellness Visit. I appreciate your ongoing commitment to your health goals. Please review the following plan we discussed and let me know if I can assist you in the future.   Screening recommendations/referrals: Colonoscopy: No longer required.  Recommended yearly ophthalmology/optometry visit for glaucoma screening and checkup Recommended yearly dental visit for hygiene and checkup  Vaccinations: Influenza vaccine: Up to date Pneumococcal vaccine: Completed series Tdap vaccine: Pt declines today.  Shingles vaccine: Pt declines today.     Advanced directives: Please bring a copy of your POA (Power of Attorney) and/or Living Will to your next appointment.   Conditions/risks identified: Recommend increasing water intake to 6-8 8 oz glasses a day.   Next appointment: 11/27/19 @ 10:00 AM with [name]. Declined scheduling an AWV for 2022 at this time.   Preventive Care 80 Years and Older, Male Preventive care refers to lifestyle choices and visits with your health care provider that can promote health and wellness. What does preventive care include?  A yearly physical exam. This is also called an annual well check.  Dental exams once or twice a year.  Routine eye exams. Ask your health care provider how often you should have your eyes checked.  Personal lifestyle choices, including:  Daily care of your teeth and gums.  Regular physical activity.  Eating a healthy diet.  Avoiding tobacco and drug use.  Limiting alcohol use.  Practicing safe sex.  Taking low doses of aspirin every day.  Taking vitamin and mineral supplements as recommended by your health care provider. What happens during an annual well check? The services and screenings done by your health care provider during your annual well check will depend on your age, overall health, lifestyle risk factors, and family history of disease. Counseling    Your health care provider may ask you questions about your:  Alcohol use.  Tobacco use.  Drug use.  Emotional well-being.  Home and relationship well-being.  Sexual activity.  Eating habits.  History of falls.  Memory and ability to understand (cognition).  Work and work Statistician. Screening  You may have the following tests or measurements:  Height, weight, and BMI.  Blood pressure.  Lipid and cholesterol levels. These may be checked every 5 years, or more frequently if you are over 80 years old.  Skin check.  Lung cancer screening. You may have this screening every year starting at age 80 if you have a 30-pack-year history of smoking and currently smoke or have quit within the past 15 years.  Fecal occult blood test (FOBT) of the stool. You may have this test every year starting at age 80.  Flexible sigmoidoscopy or colonoscopy. You may have a sigmoidoscopy every 5 years or a colonoscopy every 10 years starting at age 80.  Prostate cancer screening. Recommendations will vary depending on your family history and other risks.  Hepatitis C blood test.  Hepatitis B blood test.  Sexually transmitted disease (STD) testing.  Diabetes screening. This is done by checking your blood sugar (glucose) after you have not eaten for a while (fasting). You may have this done every 1-3 years.  Abdominal aortic aneurysm (AAA) screening. You may need this if you are a current or former smoker.  Osteoporosis. You may be screened starting at age 80 if you are at high risk. Talk with your health care provider about your test results, treatment options, and if necessary, the need for more  tests. Vaccines  Your health care provider may recommend certain vaccines, such as:  Influenza vaccine. This is recommended every year.  Tetanus, diphtheria, and acellular pertussis (Tdap, Td) vaccine. You may need a Td booster every 10 years.  Zoster vaccine. You may need this after age  80.  Pneumococcal 13-valent conjugate (PCV13) vaccine. One dose is recommended after age 80.  Pneumococcal polysaccharide (PPSV23) vaccine. One dose is recommended after age 80. Talk to your health care provider about which screenings and vaccines you need and how often you need them. This information is not intended to replace advice given to you by your health care provider. Make sure you discuss any questions you have with your health care provider. Document Released: 07/26/2015 Document Revised: 03/18/2016 Document Reviewed: 04/30/2015 Elsevier Interactive Patient Education  2017 [place] Prevention in the Home Falls can cause injuries. They can happen to people of all ages. There are many things you can do to make your home safe and to help prevent falls. What can I do on the outside of my home?  Regularly fix the edges of walkways and driveways and fix any cracks.  Remove anything that might make you trip as you walk through a door, such as a raised step or threshold.  Trim any bushes or trees on the path to your home.  Use bright outdoor lighting.  Clear any walking paths of anything that might make someone trip, such as rocks or tools.  Regularly check to see if handrails are loose or broken. Make sure that both sides of any steps have handrails.  Any raised decks and porches should have guardrails on the edges.  Have any leaves, snow, or ice cleared regularly.  Use sand or salt on walking paths during winter.  Clean up any spills in your garage right away. This includes oil or grease spills. What can I do in the bathroom?  Use night lights.  Install grab bars by the toilet and in the tub and shower. Do not use towel bars as grab bars.  Use non-skid mats or decals in the tub or shower.  If you need to sit down in the shower, use a plastic, non-slip stool.  Keep the floor dry. Clean up any water that spills on the floor as soon as it happens.  Remove  soap buildup in the tub or shower regularly.  Attach bath mats securely with double-sided non-slip rug tape.  Do not have throw rugs and other things on the floor that can make you trip. What can I do in the bedroom?  Use night lights.  Make sure that you have a light by your bed that is easy to reach.  Do not use any sheets or blankets that are too big for your bed. They should not hang down onto the floor.  Have a firm chair that has side arms. You can use this for support while you get dressed.  Do not have throw rugs and other things on the floor that can make you trip. What can I do in the kitchen?  Clean up any spills right away.  Avoid walking on wet floors.  Keep items that you use a lot in easy-to-reach places.  If you need to reach something above you, use a strong step stool that has a grab bar.  Keep electrical cords out of the way.  Do not use floor polish or wax that makes floors slippery. If you must use wax, use non-skid floor  wax.  Do not have throw rugs and other things on the floor that can make you trip. What can I do with my stairs?  Do not leave any items on the stairs.  Make sure that there are handrails on both sides of the stairs and use them. Fix handrails that are broken or loose. Make sure that handrails are as long as the stairways.  Check any carpeting to make sure that it is firmly attached to the stairs. Fix any carpet that is loose or worn.  Avoid having throw rugs at the top or bottom of the stairs. If you do have throw rugs, attach them to the floor with carpet tape.  Make sure that you have a light switch at the top of the stairs and the bottom of the stairs. If you do not have them, ask someone to add them for you. What else can I do to help prevent falls?  Wear shoes that:  Do not have high heels.  Have rubber bottoms.  Are comfortable and fit you well.  Are closed at the toe. Do not wear sandals.  If you use a  stepladder:  Make sure that it is fully opened. Do not climb a closed stepladder.  Make sure that both sides of the stepladder are locked into place.  Ask someone to hold it for you, if possible.  Clearly mark and make sure that you can see:  Any grab bars or handrails.  First and last steps.  Where the edge of each step is.  Use tools that help you move around (mobility aids) if they are needed. These include:  Canes.  Walkers.  Scooters.  Crutches.  Turn on the lights when you go into a dark area. Replace any light bulbs as soon as they burn out.  Set up your furniture so you have a clear path. Avoid moving your furniture around.  If any of your floors are uneven, fix them.  If there are any pets around you, be aware of where they are.  Review your medicines with your doctor. Some medicines can make you feel dizzy. This can increase your chance of falling. Ask your doctor what other things that you can do to help prevent falls. This information is not intended to replace advice given to you by your health care provider. Make sure you discuss any questions you have with your health care provider. Document Released: 04/25/2009 Document Revised: 12/05/2015 Document Reviewed: 08/03/2014 Elsevier Interactive Patient Education  2017 [employer].

## 2019-11-15 NOTE — Telephone Encounter (Signed)
Patient called to inform that mattress pad was delivered but states that its the wrong thing. He states that what he received is a gel overlay pad but that was not what he was expecting. Per patient he needed something that will relieve pressure on his buttock and legs because he already have a similar padding. Asking for a call back at Ph# [phone]

## 2019-11-16 NOTE — Telephone Encounter (Signed)
Returned call to pt, who stated he got the wrong mattress from health care supplier. Received a gel pad instead of the Air pad. Also called [facility] @ [phone], who stated they sent the correct pad. Advised pt to call [facility] himself and tell them he did not get the right mattress.

## 2019-11-16 NOTE — Telephone Encounter (Signed)
Patient called back states that he is still waiting on a call back from the office about the order that he received. Please advise Ph# [phone]

## 2019-11-17 DIAGNOSIS — M6281 Muscle weakness (generalized): Secondary | ICD-10-CM | POA: Diagnosis not present

## 2019-11-17 DIAGNOSIS — G20 Parkinson's disease: Secondary | ICD-10-CM | POA: Diagnosis not present

## 2019-11-17 DIAGNOSIS — M9711XS Periprosthetic fracture around internal prosthetic right knee joint, sequela: Secondary | ICD-10-CM | POA: Diagnosis not present

## 2019-11-17 DIAGNOSIS — R262 Difficulty in walking, not elsewhere classified: Secondary | ICD-10-CM | POA: Diagnosis not present

## 2019-11-21 ENCOUNTER — Telehealth: Payer: Self-pay

## 2019-11-21 NOTE — Telephone Encounter (Signed)
Called patient for measurements. Faxed.

## 2019-11-21 NOTE — Telephone Encounter (Signed)
Copied from [place] [id number]. Topic: General - Other >> Nov 21, 2019 10:06 AM Alanda Slim E wrote: Reason for CRM: Compression order received but they need the Pts leg measurements to complete the order / Please advise / it can be faxed to [phone]

## 2019-11-23 ENCOUNTER — Other Ambulatory Visit: Payer: Self-pay

## 2019-11-23 ENCOUNTER — Ambulatory Visit: Payer: Self-pay | Admitting: Urology

## 2019-11-23 ENCOUNTER — Ambulatory Visit (INDEPENDENT_AMBULATORY_CARE_PROVIDER_SITE_OTHER): Payer: PPO | Admitting: Urology

## 2019-11-23 DIAGNOSIS — R339 Retention of urine, unspecified: Secondary | ICD-10-CM

## 2019-11-23 NOTE — Progress Notes (Signed)
Catheter Removal  Patient is present today for a catheter removal.  9 ml of water was drained from the balloon. A 16 FR foley cath was removed from the bladder no complications were noted . Patient tolerated well.  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  I advised the patient and his son, Marzetta Board, to be mindful and keep the catheter on slack to prevent further erosion.  If the erosion continues to persist, I advised that we would need to switch to SPT placement.

## 2019-11-27 ENCOUNTER — Encounter: Payer: PPO | Admitting: Family Medicine

## 2019-11-27 NOTE — Progress Notes (Deleted)
Complete physical exam   Patient: Roger Rodriguez   DOB: [birth year]   79 y.o. Male  MRN: [id number] Visit Date: 12/04/2019  Today's healthcare provider: Wilhemena Durie, MD   No chief complaint on file.  Subjective    Roger Rodriguez is a 79 y.o. male who presents today for a complete physical exam.  He reports consuming a {diet types:17450} diet. {Exercise:19826} He generally feels {well/fairly well/poorly:18703}. He reports sleeping {well/fairly well/poorly:18703}. He {does/does not:200015} have additional problems to discuss today.  HPI  Patient had AWV with [facility] 11/15/2019.  Past Medical History:  Diagnosis Date  . Allergy    allergic rhinitis  . Arthritis    OA  . Chronic kidney disease    stones  . Depression   . Family history of adverse reaction to anesthesia   . Hematuria   . History of kidney stones   . Hyperlipidemia   . Hypertension    no meds since parkinsons meds started  . Incontinence of urine   . Inguinal hernia   . Neuromuscular disorder ([place])    parkinsons  . Overactive bladder   . Parkinson's disease ([place])   . Prostate cancer ([place])   . Rhinitis, allergic   . Shortness of breath dyspnea    doe secondary to parkinsons   Past Surgical History:  Procedure Laterality Date  . BACK SURGERY  2014  . CYSTOSCOPY WITH STENT PLACEMENT Right 01/13/2016   Procedure: CYSTOSCOPY WITH STENT PLACEMENT;  Surgeon: Hollice Espy, MD;  Location: [facility] ORS;  Service: Urology;  Laterality: Right;  . CYSTOSCOPY/URETEROSCOPY/HOLMIUM LASER/STENT PLACEMENT Left 11/20/2015   Procedure: CYSTOSCOPY/URETEROSCOPY/HOLMIUM LASER/STENT PLACEMENT;  Surgeon: Hollice Espy, MD;  Location: [facility] ORS;  Service: Urology;  Laterality: Left;  . EYE SURGERY    . FRACTURE SURGERY      ankle   . H/O arthroscopy of right knee  2015  . H/O radical protatectomy    . HERNIA REPAIR    . JOINT REPLACEMENT     right knee  . LITHOTRIPSY  2010  . Rotator Cuff tear  1993   Repaired  . TOTAL  KNEE REVISION Right 03/17/2018   Procedure: TOTAL KNEE REVISION;  Surgeon: Lovell Sheehan, MD;  Location: [facility] ORS;  Service: Orthopedics;  Laterality: Right;  . TOTAL KNEE REVISION Right 06/08/2018   Procedure: TOTAL KNEE REVISION;  Surgeon: Lovell Sheehan, MD;  Location: [facility] OR[facility];  Service: Orthopedics;  Laterality: Right;  . URETEROSCOPY WITH HOLMIUM LASER LITHOTRIPSY  2002?  [name] URETEROSCOPY WITH HOLMIUM LASER LITHOTRIPSY Right 01/13/2016   Procedure: URETEROSCOPY WITH HOLMIUM LASER LITHOTRIPSY;  Surgeon: Hollice Espy, MD;  Location: [facility] ORS;  Service: Urology;  Laterality: Right;  [name] VASECTOMY     History   Social History   Socioeconomic History  . Marital status: Widowed    Spouse name: Not on file  . Number of children: 2  . Years of education: Not on file  . Highest education level: Some college, no degree  Occupational History  . Occupation: retired  Tobacco Use  . Smoking status: Former Smoker    Types: Cigars    Quit date: 11/11/1968    Years since quitting: 51.0  . Smokeless tobacco: Former Systems developer    Types: Chew    Quit date: 11/12/2003  . Tobacco comment: Occassionally, also chewed tobacco till 2005. Quit smoking 1973  Substance and Sexual Activity  . Alcohol use: No    Alcohol/week: 0.0 standard drinks  . Drug  use: No  . Sexual activity: Not Currently  Other Topics Concern  . Not on file  Social History Narrative  . Not on file   Social Determinants of Health   Financial Resource Strain: Low Risk   . Difficulty of Paying Living Expenses: Not hard at all  Food Insecurity: No Food Insecurity  . Worried About Charity fundraiser in the Last Year: Never true  . Ran Out of Food in the Last Year: Never true  Transportation Needs: No Transportation Needs  . Lack of Transportation (Medical): No  . Lack of Transportation (Non-Medical): No  Physical Activity: Inactive  . Days of Exercise per Week: 0 days  . Minutes of Exercise per Session: 0 min  Stress: No Stress  Concern Present  . Feeling of Stress : Only a little  Social Connections: Moderately Isolated  . Frequency of Communication with Friends and Family: More than three times a week  . Frequency of Social Gatherings with Friends and Family: More than three times a week  . Attends Religious Services: Never  . Active Member of Clubs or Organizations: No  . Attends Archivist Meetings: Never  . Marital Status: Widowed  Intimate Partner Violence: Not At Risk  . Fear of Current or Ex-Partner: No  . Emotionally Abused: No  . Physically Abused: No  . Sexually Abused: No   Family Status  Relation Name Status  . Mother  Deceased at age 74       died from congestive heart failure; history of MI  . Father  Deceased at age 50       died from congestive heart failure; History of stroke  . Sister  Deceased at age 52       Cause od death was liver cancer  . Annamarie Major  (Not Specified)  . Sister  (Not Specified)  . Neg Hx  (Not Specified)   Family History  Problem Relation Age of Onset  . Dementia Mother   . Cerebral aneurysm Father   . Bladder Cancer Paternal Uncle   . Liver cancer Sister   . Prostate cancer Neg Hx   . Kidney cancer Neg Hx    Allergies  Allergen Reactions  . Sulfa Antibiotics Swelling    Lip swelling    Patient Care Team: [name]., MD as PCP - General (Family Medicine) Hollice Espy, MD as Consulting Physician (Urology) Vladimir Crofts, MD as Consulting Physician (Neurology) Leandrew Koyanagi, MD as Referring Physician (Ophthalmology) Sunday Corn, Mervin Kung (Neurology) Ralene Bathe, MD (Dermatology)   Medications: Outpatient Medications Prior to Visit  Medication Sig  . budesonide (RHINOCORT AQUA) 32 MCG/ACT nasal spray USE 1 PUFF IN EACH NOSTRIL EVERY DAY AS NEEDED (Patient taking differently: Place 1 spray into both nostrils daily as needed for allergies. )  . busPIRone (BUSPAR) 7.5 MG tablet Take 7.5 mg by mouth 2 (two) times  daily. To 3 times daily  . celecoxib (CELEBREX) 100 MG capsule TAKE 1 CAPSULE BY MOUTH TWICE A DAY  . cholecalciferol (VITAMIN D) 1000 units tablet Take 1,000 Units by mouth daily.   . enalapril (VASOTEC) 5 MG tablet TAKE 1 TABLET BY MOUTH EVERY DAY (Patient taking differently: Take 2.5 mg by mouth 2 (two) times daily. )  . gabapentin (NEURONTIN) 400 MG capsule Take 400 mg by mouth 3 (three) times daily.   [name] glucose blood (QUINTET AC BLOOD GLUCOSE TEST) test strip Use as instructed  . Hypromellose (ARTIFICIAL TEARS OP)  Place 1 drop into both eyes 4 (four) times daily as needed (for dry eyes).   . insulin aspart (NOVOLOG) 100 UNIT/ML injection Inject 0-15 Units into the skin 3 (three) times daily with meals. (Patient not taking: Reported on 11/15/2019)  . loratadine (CLARITIN) 10 MG tablet Take 10 mg by mouth daily.  . metFORMIN (GLUCOPHAGE) 500 MG tablet TAKE 1 TABLET (500 MG TOTAL) BY MOUTH 2 (TWO) TIMES DAILY WITH A MEAL.  . mirabegron ER (MYRBETRIQ) 25 MG TB24 tablet Myrbetriq 25 mg tablet,extended release  TAKE 1 TABLET (25 MG TOTAL) 2 (TWO) TIMES DAILY BY MOUTH. (Patient not taking: Reported on 09/12/2019)  . Multiple Vitamin (MULTIVITAMIN WITH MINERALS) TABS tablet Take 1 tablet by mouth daily.  . polyethylene glycol (MIRALAX / GLYCOLAX) packet Take 17 g by mouth daily as needed.   . pramipexole (MIRAPEX) 0.5 MG tablet Take 2 tablets (1 mg total) by mouth at bedtime. (Patient taking differently: Take 0.25 mg by mouth 3 (three) times daily. )  . RYTARY 23.75-95 MG CPCR Take 1 capsule by mouth 4 (four) times daily.   [name] RYTARY 61.25-245 MG CPCR Take 1 capsule by mouth 4 (four) times daily.  . vitamin B-12 (CYANOCOBALAMIN) 1000 MCG tablet Take 1,000 mcg by mouth daily.  . vitamin C (ASCORBIC ACID) 500 MG tablet Take 1,000 mg by mouth daily.   . vitamin E (VITAMIN E) 400 UNIT capsule Take 400 Units by mouth daily.    No facility-administered medications prior to visit.    Review of  Systems  {Show previous labs (optional):23779::" "}  Objective    There were no vitals taken for this visit. {Show previous vital signs (optional):23777::" "}  Physical Exam  ***  Depression Screen  PHQ 2/9 Scores 11/15/2019 09/01/2018 07/25/2018  PHQ - 2 Score 1 2 1   PHQ- 9 Score - 5 -    No results found for any visits on 12/04/19.  Assessment & Plan    Routine Health Maintenance and Physical Exam  Exercise Activities and Dietary recommendations Goals    . DIET - REDUCE PORTION SIZE     Recommend monitoring portion sizes and decreasing portions by half and continuing to eat healthy snacks in between.     . I got a letter from the [facility] again (pt-stated)     Current Barriers:  . Diminished manual dexterity . Financial Barriers  Pharmacist Clinical Goal(s):  [name] Over the next 10 days, patient will work with pharmacist to address needs related to [employer] and [employer]    Interventions: . Collaboration with [facility]   . Collaboration with [employer]   Patient Self Care Activities:  . Calls pharmacy for medication refills- making sure that they are billing his grant through the foundation . Reschedule visit with Dr. Manuella Ghazi  Please see past updates related to this goal by clicking on the "Past Updates" button in the selected goal      . I was recently diagnoses with Diabetes and I want to make sure I am doing what I should to get my sugars down (pt-stated)     Current Barriers:  [name] Knowledge Deficits related to basic Diabetes pathophysiology and self care/management . Inability to self care secondary to Parkinsons  Nurse Case Manager Clinical Goal(s):  [name] Over the next 90 days, patient will have a reduction in A1C as evidenced by chart review and patient verbalization  Interventions:  .  Assessed for medication compliance . Reviewed CBG log and  reinforced education on how certain foods can increase blood sugars . Reviewed  scheduled/upcoming provider appointments including: virtual visit with neurology on Thursday 02/07/2019 . Reinforced importance of daily CBG checks and record, calling Dr. Rosanna Randy for findings outside established parameters.    Patient Self Care Activities:  . Self administers medications as prescribed . Attends all scheduled provider appointments . Checks blood sugars as prescribed and utilize hyper and hypoglycemia protocol as needed . Adhere to ADA diet as discussed . Patient/Caregivers will follow CDC guidelines for COVID-19 infection prevention   Please see past updates related to this goal by clicking on the "Past Updates" button in the selected goal      . Increase water intake     Recommend increasing water intake to 4-5 glasses a day.        Immunization History  Administered Date(s) Administered  . Fluad Quad(high Dose 65+) 04/05/2019  . Influenza, High Dose Seasonal PF 04/02/2015, 04/02/2016, 04/15/2017, 06/02/2018  . Influenza,inj,Quad PF,6+ Mos 04/03/2014  . Pneumococcal Conjugate-13 10/22/2016  . Pneumococcal Polysaccharide-23 04/06/2013, 04/05/2014  . Td 11/30/2006  . Zoster 04/06/2013    Health Maintenance  Topic Date Due  . COVID-19 Vaccine (1) Never done  . TETANUS/TDAP  11/14/2020 (Originally 11/29/2016)  . INFLUENZA VACCINE  02/11/2020  . PNA vac Low Risk Adult  Completed    Discussed health benefits of physical activity, and encouraged him to engage in regular exercise appropriate for his age and condition.  ***  No follow-ups on file.     {provider attestation***:1}   Wilhemena Durie, MD  [facility] [phone] (phone) [phone] (fax)  [place]

## 2019-12-04 ENCOUNTER — Encounter: Payer: PPO | Admitting: Family Medicine

## 2019-12-16 DIAGNOSIS — E785 Hyperlipidemia, unspecified: Secondary | ICD-10-CM | POA: Diagnosis not present

## 2019-12-16 DIAGNOSIS — I10 Essential (primary) hypertension: Secondary | ICD-10-CM | POA: Diagnosis not present

## 2019-12-16 DIAGNOSIS — G20 Parkinson's disease: Secondary | ICD-10-CM | POA: Diagnosis not present

## 2019-12-16 DIAGNOSIS — R0602 Shortness of breath: Secondary | ICD-10-CM | POA: Diagnosis not present

## 2019-12-27 ENCOUNTER — Ambulatory Visit: Payer: PPO | Admitting: Urology

## 2019-12-27 ENCOUNTER — Other Ambulatory Visit: Payer: Self-pay

## 2019-12-27 ENCOUNTER — Encounter: Payer: Self-pay | Admitting: Urology

## 2019-12-27 VITALS — BP 152/81 | HR 82 | Ht 72.0 in | Wt 170.0 lb

## 2019-12-27 DIAGNOSIS — R339 Retention of urine, unspecified: Secondary | ICD-10-CM | POA: Diagnosis not present

## 2019-12-27 NOTE — Progress Notes (Signed)
Cath Change/ Replacement  Patient is present today for a catheter change due to urinary retention.  8 mL of water was removed from the balloon, a 16 FR foley cath was removed with out difficulty.  Patient was cleaned and prepped in a sterile fashion with betadine. A 16 FR foley cath was replaced into the bladder no complications were noted Urine return was noted 10 ml and urine was yellow in color. The balloon was filled with 31ml of sterile water. A leg bag was attached for drainage.  A night bag was also given to the patient and patient was given instruction on how to change from one bag to another. Patient was given proper instruction on catheter care.    Performed by: Nori Riis, PA-C  Follow up: one month

## 2019-12-28 ENCOUNTER — Other Ambulatory Visit: Payer: PPO | Admitting: Adult Health Nurse Practitioner

## 2019-12-28 DIAGNOSIS — G20 Parkinson's disease: Secondary | ICD-10-CM

## 2019-12-28 DIAGNOSIS — Z515 Encounter for palliative care: Secondary | ICD-10-CM | POA: Diagnosis not present

## 2019-12-28 NOTE — Progress Notes (Signed)
[facility] Consult Note Telephone: [phone]  Fax: [phone]  PATIENT NAME: Roger Rodriguez DOB: 1941/06/10 MRN: [id number]  PRIMARY CARE PROVIDER:   [name]., MD  REFERRING PROVIDER:  Gayland Curry, [place], neurology  RESPONSIBLE PARTY:   Self H: [phone] C: [phone] Ysabel Cowgill, son [phone] Maricela Kawahara, son [phone] [name], caregiver [phone]    RECOMMENDATIONS and PLAN:  1.  Advanced care planning. Patient is a DNR  2.  Parkinson's disease.  Patient followed by Dr. Brigitte Pulse.  Patient is wheelchair-bound spends most of her day sitting in a chair or lying in bed.  Patient is on Rytary 61.25/245 mg 245 mg 4 times daily and Rytary 23.75/95 mg 2 tabs 4 times daily.  He does not report any increased tremors but does state that he is having more freezing episodes with his right arm and has been eating more with his left hand over the past 2 to 3 weeks.  He is right-hand dominant.  We will reach out to Dr. Trena Platt office with this concern to see if he needs to make any adjustments to his medications.  3.  Urinary retention.  Patient is being seen by neurology.  He has chronic Foley catheter.  Just had his catheter changed at urology office yesterday.  He reports no issues with this.  Does state that it wears him out going to the office to have it changed monthly.  Did have a bad experience when home health nurse changed it out for him.  Did discuss talking with his urologist when he is comfortable having home health nurse try changing it out at home in the future.  Continue follow-up and recommendations by urology.  4.  Sleep disturbance.  Patient states that he recently he has been waking up around 3 in the morning and staying up for a couple of hours.  This is making him feel less rested during the day.  Does not want anything for sleep as he feels he already sleeps pretty deeply when he is asleep and does  not want to try anything else for sleep at this time.  We will continue to monitor for sleep disturbance and daytime fatigue.  5.  Chest pain.  Patient points to spot on the left side of lower rib cage and states that he had an episode of burning in that spot on his chest.  Stated that it would come and go and felt almost like a shock.  States that this happened about a week ago and has not had any episodes since.  Advised to record if this happens again and if he notices any food intake or movement that may have caused it.  6.  Patient states that his grandson is getting married on January 8 and he would like to attend the wedding.  Is asking if we know of any resources in which he can find a place to rent a Reader with a ramp to make it easier for him to travel.  We will reach out to our office and social workers to see if they know of any such resources and will let him know what we find out.  Palliative will continue to monitor for symptom management/decline and make recommendations as needed. Have next appointment in 6 weeks  I spent 60 minutes providing this consultation,  from 2:30 to 3:30 including time with patient/family, chart review, provider coordination, and documentation. More than 50% of the time in this  consultation was spent coordinating communication.   HISTORY OF PRESENT ILLNESS:  Roger Rodriguez is a 79 y.o. year old male with multiple medical problems including Parkinson's disease, CKD, OA, HTN, depression. Palliative Care was asked to help address goals of care.   CODE STATUS: DNR  PPS: 40% HOSPICE ELIGIBILITY/DIAGNOSIS: TBD  PHYSICAL EXAM: BP 128/72 HR 78 O2 98% on RA General:sitting in wheelchair in NAD Cardiovascular: regular rate and rhythm Pulmonary:lung sounds clear; normal respiratory effort Abdomen: soft, nontender, + bowel sounds FI:EPPIR catheter in place withclearyellow urine noted in bag Extremities:trace edema to feet with more to right foot, no  joint deformities Skin:  no rashes on exposed skin Neurological:Patient is wheelchair bound; has some forgetfulness  PAST MEDICAL HISTORY:  Past Medical History:  Diagnosis Date  . Allergy    allergic rhinitis  . Arthritis    OA  . Chronic kidney disease    stones  . Depression   . Family history of adverse reaction to anesthesia   . Hematuria   . History of kidney stones   . Hyperlipidemia   . Hypertension    no meds since parkinsons meds started  . Incontinence of urine   . Inguinal hernia   . Neuromuscular disorder ([place])    parkinsons  . Overactive bladder   . Parkinson's disease ([place])   . Prostate cancer ([place])   . Rhinitis, allergic   . Shortness of breath dyspnea    doe secondary to parkinsons    SOCIAL HX:  Social History   Tobacco Use  . Smoking status: Former Smoker    Types: Cigars    Quit date: 11/11/1968    Years since quitting: 51.1  . Smokeless tobacco: Former Systems developer    Types: Chew    Quit date: 11/12/2003  . Tobacco comment: Occassionally, also chewed tobacco till 2005. Quit smoking 1973  Substance Use Topics  . Alcohol use: No    Alcohol/week: 0.0 standard drinks    ALLERGIES:  Allergies  Allergen Reactions  . Sulfa Antibiotics Swelling    Lip swelling     PERTINENT MEDICATIONS:  Outpatient Encounter Medications as of 12/28/2019  Medication Sig  . budesonide (RHINOCORT AQUA) 32 MCG/ACT nasal spray USE 1 PUFF IN EACH NOSTRIL EVERY DAY AS NEEDED (Patient taking differently: Place 1 spray into both nostrils daily as needed for allergies. )  . busPIRone (BUSPAR) 7.5 MG tablet Take 7.5 mg by mouth 2 (two) times daily. To 3 times daily  . celecoxib (CELEBREX) 100 MG capsule TAKE 1 CAPSULE BY MOUTH TWICE A DAY  . cholecalciferol (VITAMIN D) 1000 units tablet Take 1,000 Units by mouth daily.   . enalapril (VASOTEC) 5 MG tablet TAKE 1 TABLET BY MOUTH EVERY DAY (Patient taking differently: Take 2.5 mg by mouth 2 (two) times daily. )  . gabapentin  (NEURONTIN) 400 MG capsule Take 400 mg by mouth 3 (three) times daily.   [name] glucose blood (QUINTET AC BLOOD GLUCOSE TEST) test strip Use as instructed  . Hypromellose (ARTIFICIAL TEARS OP) Place 1 drop into both eyes 4 (four) times daily as needed (for dry eyes).   . insulin aspart (NOVOLOG) 100 UNIT/ML injection Inject 0-15 Units into the skin 3 (three) times daily with meals.  [name] loratadine (CLARITIN) 10 MG tablet Take 10 mg by mouth daily.  . metFORMIN (GLUCOPHAGE) 500 MG tablet TAKE 1 TABLET (500 MG TOTAL) BY MOUTH 2 (TWO) TIMES DAILY WITH A MEAL.  . mirabegron ER (MYRBETRIQ) 25 MG TB24 tablet  Myrbetriq 25 mg tablet,extended release  TAKE 1 TABLET (25 MG TOTAL) 2 (TWO) TIMES DAILY BY MOUTH.  . Multiple Vitamin (MULTIVITAMIN WITH MINERALS) TABS tablet Take 1 tablet by mouth daily.  . polyethylene glycol (MIRALAX / GLYCOLAX) packet Take 17 g by mouth daily as needed.   . pramipexole (MIRAPEX) 0.5 MG tablet Take 2 tablets (1 mg total) by mouth at bedtime. (Patient taking differently: Take 0.25 mg by mouth 3 (three) times daily. )  . RYTARY 23.75-95 MG CPCR Take 1 capsule by mouth 4 (four) times daily.   [name] RYTARY 61.25-245 MG CPCR Take 1 capsule by mouth 4 (four) times daily.  . sertraline (ZOLOFT) 25 MG tablet Take 25 mg by mouth daily.  . vitamin B-12 (CYANOCOBALAMIN) 1000 MCG tablet Take 1,000 mcg by mouth daily.  . vitamin C (ASCORBIC ACID) 500 MG tablet Take 1,000 mg by mouth daily.   . vitamin E (VITAMIN E) 400 UNIT capsule Take 400 Units by mouth daily.    No facility-administered encounter medications on file as of 12/28/2019.     Thijs Brunton Jenetta Downer, NP

## 2020-01-10 ENCOUNTER — Telehealth: Payer: Self-pay

## 2020-01-10 NOTE — Telephone Encounter (Signed)
Copied from [facility] [id number]. Topic: General - Other >> Jan 10, 2020 11:06 AM Hinda Lenis D wrote: Reason for CRM: PT has question about his medication / please advise  Patient reports he is needing to re apply for his RYTARY 61.25-245 mg. We do have his previous enrollment for [facility]. His eligibility started 01/19/2019 and ends on 01/18/2020.

## 2020-01-12 DIAGNOSIS — R269 Unspecified abnormalities of gait and mobility: Secondary | ICD-10-CM | POA: Insufficient documentation

## 2020-01-12 DIAGNOSIS — M25669 Stiffness of unspecified knee, not elsewhere classified: Secondary | ICD-10-CM | POA: Insufficient documentation

## 2020-01-15 DIAGNOSIS — R0602 Shortness of breath: Secondary | ICD-10-CM | POA: Diagnosis not present

## 2020-01-15 DIAGNOSIS — I10 Essential (primary) hypertension: Secondary | ICD-10-CM | POA: Diagnosis not present

## 2020-01-15 DIAGNOSIS — G20 Parkinson's disease: Secondary | ICD-10-CM | POA: Diagnosis not present

## 2020-01-15 DIAGNOSIS — E785 Hyperlipidemia, unspecified: Secondary | ICD-10-CM | POA: Diagnosis not present

## 2020-01-16 ENCOUNTER — Telehealth: Payer: Self-pay | Admitting: Family Medicine

## 2020-01-16 NOTE — Telephone Encounter (Signed)
Pt is calling back regarding medication and needing to re apply for his RYTARY. Please advise

## 2020-01-16 NOTE — Telephone Encounter (Signed)
Patient is needing help with medication.

## 2020-01-30 ENCOUNTER — Ambulatory Visit: Payer: PPO | Admitting: Urology

## 2020-01-30 ENCOUNTER — Other Ambulatory Visit: Payer: Self-pay

## 2020-01-30 DIAGNOSIS — R339 Retention of urine, unspecified: Secondary | ICD-10-CM

## 2020-01-30 NOTE — Progress Notes (Signed)
.  Cath Change/ Replacement  Patient is present today for a catheter change due to urinary retention.  8 ml of water was removed from the balloon, a 16FR foley cath was removed with out difficulty.  Patient was cleaned and prepped in a sterile fashion with betadine. A 16 FR foley cath was replaced into the bladder no complications were noted Urine return was noted 15 ml and urine was yellow in color. The balloon was filled with 14ml of sterile water. A leg bag was attached for drainage.  A night bag was also given to the patient and patient was given instruction on how to change from one bag to another. Patient was given proper instruction on catheter care.    Performed by: Zara Council  Follow up: 1 month

## 2020-02-06 ENCOUNTER — Other Ambulatory Visit: Payer: Self-pay

## 2020-02-06 ENCOUNTER — Other Ambulatory Visit: Payer: PPO | Admitting: Adult Health Nurse Practitioner

## 2020-02-06 DIAGNOSIS — Z515 Encounter for palliative care: Secondary | ICD-10-CM

## 2020-02-06 DIAGNOSIS — G20 Parkinson's disease: Secondary | ICD-10-CM

## 2020-02-06 NOTE — Progress Notes (Signed)
Roger Rodriguez Consult Note Telephone: [phone]  Fax: [phone]  PATIENT NAME: Roger Rodriguez DOB: [birth year] MRN: [id number]  PRIMARY CARE PROVIDER:   Jerrol Rodriguez., MD  REFERRING PROVIDER:  Gayland Rodriguez, [place], neurology  RESPONSIBLE PARTY:   Self H: [phone] C: [phone] Roger Rodriguez, son [phone] Roger Rodriguez, son [phone] Roger Rodriguez, caregiver [phone]    RECOMMENDATIONS and PLAN:  1.  Advanced care planning. Patient is a DNR  2.  Parkinson's disease.  Patient being followed by Dr. Manuella Rodriguez.  Patient is wheelchair bound spend most of his day sitting in chair or lying in bed.  Patient is on Rytary 61.25/245 mg 245 mg 4 times daily and Rytary 23.75/95 mg 2 tabs 4 times daily.  He does report that his right arm is becoming harder to move and that he has to use his left arm to position his right arm.  Does state having increased tremors in the left hand.  Did notice patient was having tremors in his left hand today.  Does state that it is getting harder to get in and out of vehicles when he has to go out for appointments. States having more sensation of feeling cold in his arms and knees.  We will reach out to Dr. Trena Rodriguez office with these concerns to see if he needs to set up a sooner appointment.  3.  Urinary retention.  Patient is being seen by urology and had his Foley catheter replaced last week.  Caregiver states that she has noticed that over the past 2 days he has had a stronger odor to his urine and has noted some sediment.  Denies fever, low back pain, dysuria, hematuria.  Have encouraged to increase water intake and will have clinical navigator call in 1 week for check-in to assess for any urinary symptoms.  4.  Sleep disturbance.  Patient is continuing to have frequent episodes throughout the night where he wakes up and sometimes cannot go back to sleep.  States that he gets 3 to 4 hours of good sleep  at night.  Does not want to try anything for this as he feels like he is already on too much medication.  5.  Depression.  Patient is currently on BuSpar and sertraline.  Does states that he has thoughts that he is a burden to his family and to others.  Does get depressed and grieves the loss of his independence with his disease progression.  Denies any thoughts of harming himself.  Does not want to add or adjust any of his medications at this time.  We will continue to assess at each visit for increased depression.  Palliative will continue to monitor for symptom management/decline and make recommendations as needed. Have next appointment in 8 weeks  I spent 80 minutes providing this consultation,  from 2:00 to 3:20 including time spent with patient, chart review, provider coordination, documentation. More than 50% of the time in this consultation was spent coordinating communication.   HISTORY OF PRESENT ILLNESS:  Roger Rodriguez is a 79 y.o. year old male with multiple medical problems including Parkinson's disease, CKD, OA, HTN, depression. Palliative Care was asked to help address goals of care.   CODE STATUS: DNR  PPS: 40% HOSPICE ELIGIBILITY/DIAGNOSIS: TBD  PHYSICAL EXAM: BP 118/70HR 83O2 97% on RA General:sitting in wheelchair in NAD Cardiovascular: regular rate and rhythm Pulmonary:lung sounds clear; normal respiratory effort Abdomen: soft, nontender, + bowel sounds AS:NKNLZ catheter in place  withclearyellow urine noted in bag Extremities:trace edema to feet with more to right foot, no joint deformities Skin:no rashes on exposed skin Neurological:Patient is wheelchair bound; has some forgetfulness  PAST MEDICAL HISTORY:  Past Medical History:  Diagnosis Date  . Allergy    allergic rhinitis  . Arthritis    OA  . Chronic kidney disease    stones  . Depression   . Family history of adverse reaction to anesthesia   . Hematuria   . History of kidney stones   .  Hyperlipidemia   . Hypertension    no meds since parkinsons meds started  . Incontinence of urine   . Inguinal hernia   . Neuromuscular disorder ([place])    parkinsons  . Overactive bladder   . Parkinson's disease ([place])   . Prostate cancer ([place])   . Rhinitis, allergic   . Shortness of breath dyspnea    doe secondary to parkinsons    SOCIAL HX:  Social History   Tobacco Use  . Smoking status: Former Smoker    Types: Cigars    Quit date: 11/11/1968    Years since quitting: 51.2  . Smokeless tobacco: Former Systems developer    Types: Chew    Quit date: 11/12/2003  . Tobacco comment: Occassionally, also chewed tobacco till 2005. Quit smoking 1973  Substance Use Topics  . Alcohol use: No    Alcohol/week: 0.0 standard drinks    ALLERGIES:  Allergies  Allergen Reactions  . Sulfa Antibiotics Swelling    Lip swelling     PERTINENT MEDICATIONS:  Outpatient Encounter Medications as of 02/06/2020  Medication Sig  . budesonide (RHINOCORT AQUA) 32 MCG/ACT nasal spray USE 1 PUFF IN EACH NOSTRIL EVERY DAY AS NEEDED (Patient taking differently: Place 1 spray into both nostrils daily as needed for allergies. )  . busPIRone (BUSPAR) 7.5 MG tablet Take 7.5 mg by mouth 2 (two) times daily. To 3 times daily  . celecoxib (CELEBREX) 100 MG capsule TAKE 1 CAPSULE BY MOUTH TWICE A DAY  . cholecalciferol (VITAMIN D) 1000 units tablet Take 1,000 Units by mouth daily.   . enalapril (VASOTEC) 5 MG tablet TAKE 1 TABLET BY MOUTH EVERY DAY (Patient taking differently: Take 2.5 mg by mouth 2 (two) times daily. )  . gabapentin (NEURONTIN) 100 MG capsule Take 200 mg by mouth 3 (three) times daily.  [name] gabapentin (NEURONTIN) 400 MG capsule Take 400 mg by mouth 3 (three) times daily.   [name] glucose blood (QUINTET AC BLOOD GLUCOSE TEST) test strip Use as instructed  . Hypromellose (ARTIFICIAL TEARS OP) Place 1 drop into both eyes 4 (four) times daily as needed (for dry eyes).   . insulin aspart (NOVOLOG) 100 UNIT/ML injection  Inject 0-15 Units into the skin 3 (three) times daily with meals.  [name] loratadine (CLARITIN) 10 MG tablet Take 10 mg by mouth daily.  . metFORMIN (GLUCOPHAGE) 500 MG tablet TAKE 1 TABLET (500 MG TOTAL) BY MOUTH 2 (TWO) TIMES DAILY WITH A MEAL.  . mirabegron ER (MYRBETRIQ) 25 MG TB24 tablet Myrbetriq 25 mg tablet,extended release  TAKE 1 TABLET (25 MG TOTAL) 2 (TWO) TIMES DAILY BY MOUTH.  . Multiple Vitamin (MULTIVITAMIN WITH MINERALS) TABS tablet Take 1 tablet by mouth daily.  . polyethylene glycol (MIRALAX / GLYCOLAX) packet Take 17 g by mouth daily as needed.   . pramipexole (MIRAPEX) 0.5 MG tablet Take 2 tablets (1 mg total) by mouth at bedtime. (Patient taking differently: Take 0.25 mg by mouth 3 (three)  times daily. )  . RYTARY 23.75-95 MG CPCR Take 1 capsule by mouth 4 (four) times daily.   [name] RYTARY 61.25-245 MG CPCR Take 1 capsule by mouth 4 (four) times daily.  . sertraline (ZOLOFT) 25 MG tablet Take 25 mg by mouth daily.  . vitamin B-12 (CYANOCOBALAMIN) 1000 MCG tablet Take 1,000 mcg by mouth daily.  . vitamin C (ASCORBIC ACID) 500 MG tablet Take 1,000 mg by mouth daily.   . vitamin E (VITAMIN E) 400 UNIT capsule Take 400 Units by mouth daily.    No facility-administered encounter medications on file as of 02/06/2020.     Mercedies Ganesh Jenetta Downer, NP

## 2020-02-09 ENCOUNTER — Other Ambulatory Visit: Payer: Self-pay

## 2020-02-09 ENCOUNTER — Ambulatory Visit (INDEPENDENT_AMBULATORY_CARE_PROVIDER_SITE_OTHER): Payer: PPO | Admitting: Physician Assistant

## 2020-02-09 ENCOUNTER — Encounter: Payer: Self-pay | Admitting: Physician Assistant

## 2020-02-09 VITALS — BP 126/74 | HR 86 | Ht 72.0 in

## 2020-02-09 DIAGNOSIS — R300 Dysuria: Secondary | ICD-10-CM | POA: Diagnosis not present

## 2020-02-09 LAB — URINALYSIS, COMPLETE
Bilirubin, UA: NEGATIVE
Glucose, UA: NEGATIVE
Ketones, UA: NEGATIVE
Nitrite, UA: POSITIVE — AB
Protein,UA: NEGATIVE
RBC, UA: NEGATIVE
Specific Gravity, UA: 1.02 (ref 1.005–1.030)
Urobilinogen, Ur: 0.2 mg/dL (ref 0.2–1.0)
pH, UA: 5.5 (ref 5.0–7.5)

## 2020-02-09 LAB — MICROSCOPIC EXAMINATION: WBC, UA: >30 /hpf — AB (ref 0–5)

## 2020-02-09 MED ORDER — AMOXICILLIN-POT CLAVULANATE 875-125 MG PO TABS: 1.0000 | ORAL_TABLET | Freq: Two times a day (BID) | ORAL | 0 refills | Status: AC

## 2020-02-09 NOTE — Progress Notes (Signed)
02/09/2020 3:53 PM   Roger Rodriguez [birth year] [id number]  CC: Chief Complaint  Patient presents with  . Dysuria   HPI: Roger Rodriguez is a 79 y.o. 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  Today patient reports a 1 day history of bladder pain, low back pain, and headache.  He denies fever, chills, nausea, vomiting, and gross hematuria.  Foley catheter changed in clinic most recently 10 days ago.  In-office UA today positive for nitrites and 2+ leukocyte esterase; urine microscopy with >30 WBCs/HPF and many bacteria.  PMH: Past Medical History:  Diagnosis Date  . Allergy    allergic rhinitis  . Arthritis    OA  . Chronic kidney disease    stones  . Depression   . Family history of adverse reaction to anesthesia   . Hematuria   . History of kidney stones   . Hyperlipidemia   . Hypertension    no meds since parkinsons meds started  . Incontinence of urine   . Inguinal hernia   . Neuromuscular disorder ([place])    parkinsons  . Overactive bladder   . Parkinson's disease ([place])   . Prostate cancer ([place])   . Rhinitis, allergic   . Shortness of breath dyspnea    doe secondary to parkinsons    Surgical History: Past Surgical History:  Procedure Laterality Date  . BACK SURGERY  2014  . CYSTOSCOPY WITH STENT PLACEMENT Right 01/13/2016   Procedure: CYSTOSCOPY WITH STENT PLACEMENT;  Surgeon: Hollice Espy, MD;  Location: [facility];  Service: Urology;  Laterality: Right;  . CYSTOSCOPY/URETEROSCOPY/HOLMIUM LASER/STENT PLACEMENT Left 11/20/2015   Procedure: CYSTOSCOPY/URETEROSCOPY/HOLMIUM LASER/STENT PLACEMENT;  Surgeon: Hollice Espy, MD;  Location: [facility];  Service: Urology;  Laterality: Left;  . EYE SURGERY    . FRACTURE SURGERY      ankle   . H/O arthroscopy of right knee  2015  . H/O  radical protatectomy    . HERNIA REPAIR    . JOINT REPLACEMENT     right knee  . LITHOTRIPSY  2010  . Rotator Cuff tear  1993   Repaired  . TOTAL KNEE REVISION Right 03/17/2018   Procedure: TOTAL KNEE REVISION;  Surgeon: Lovell Sheehan, MD;  Location: [facility];  Service: Orthopedics;  Laterality: Right;  . TOTAL KNEE REVISION Right 06/08/2018   Procedure: TOTAL KNEE REVISION;  Surgeon: Lovell Sheehan, MD;  Location: [facility];  Service: Orthopedics;  Laterality: Right;  . URETEROSCOPY WITH HOLMIUM LASER LITHOTRIPSY  2002?  [name] URETEROSCOPY WITH HOLMIUM LASER LITHOTRIPSY Right 01/13/2016   Procedure: URETEROSCOPY WITH HOLMIUM LASER LITHOTRIPSY;  Surgeon: Hollice Espy, MD;  Location: [facility];  Service: Urology;  Laterality: Right;  [name] VASECTOMY     History    Home Medications:  Allergies as of 02/09/2020      Reactions   Sulfa Antibiotics Swelling   Lip swelling      Medication List       Accurate as of February 09, 2020  3:53 PM. If you have any questions, ask your nurse or doctor.        amoxicillin-clavulanate 875-125 MG tablet Commonly known as: AUGMENTIN Take 1 tablet by mouth 2 (two) times daily for 7 days. Started by: Debroah Loop, PA-C   ARTIFICIAL TEARS OP Place 1 drop into both eyes 4 (four) times daily as needed (  for dry eyes).   budesonide 32 MCG/ACT nasal spray Commonly known as: RHINOCORT AQUA USE 1 PUFF IN EACH NOSTRIL EVERY DAY AS NEEDED What changed: See the new instructions.   busPIRone 7.5 MG tablet Commonly known as: BUSPAR Take 7.5 mg by mouth 2 (two) times daily. To 3 times daily   celecoxib 100 MG capsule Commonly known as: CELEBREX TAKE 1 CAPSULE BY MOUTH TWICE A DAY   cholecalciferol 1000 units tablet Commonly known as: VITAMIN D Take 1,000 Units by mouth daily.   enalapril 5 MG tablet Commonly known as: VASOTEC TAKE 1 TABLET BY MOUTH EVERY DAY What changed:   how much to take  when to take this   gabapentin 400 MG  capsule Commonly known as: NEURONTIN Take 400 mg by mouth 3 (three) times daily.   gabapentin 100 MG capsule Commonly known as: NEURONTIN Take 200 mg by mouth 3 (three) times daily.   glucose blood test strip Commonly known as: Quintet AC Blood Glucose Test Use as instructed   insulin aspart 100 UNIT/ML injection Commonly known as: novoLOG Inject 0-15 Units into the skin 3 (three) times daily with meals.   loratadine 10 MG tablet Commonly known as: CLARITIN Take 10 mg by mouth daily.   metFORMIN 500 MG tablet Commonly known as: GLUCOPHAGE TAKE 1 TABLET (500 MG TOTAL) BY MOUTH 2 (TWO) TIMES DAILY WITH A MEAL.   mirabegron ER 25 MG Tb24 tablet Commonly known as: Myrbetriq Myrbetriq 25 mg tablet,extended release  TAKE 1 TABLET (25 MG TOTAL) 2 (TWO) TIMES DAILY BY MOUTH.   multivitamin with minerals Tabs tablet Take 1 tablet by mouth daily.   polyethylene glycol 17 g packet Commonly known as: MIRALAX / GLYCOLAX Take 17 g by mouth daily as needed.   pramipexole 0.5 MG tablet Commonly known as: MIRAPEX Take 2 tablets (1 mg total) by mouth at bedtime. What changed:   how much to take  when to take this   Rytary 23.75-95 MG Cpcr Generic drug: Carbidopa-Levodopa ER Take 1 capsule by mouth 4 (four) times daily.   Rytary 61.25-245 MG Cpcr Generic drug: Carbidopa-Levodopa ER Take 1 capsule by mouth 4 (four) times daily.   sertraline 25 MG tablet Commonly known as: ZOLOFT Take 25 mg by mouth daily.   vitamin B-12 1000 MCG tablet Commonly known as: CYANOCOBALAMIN Take 1,000 mcg by mouth daily.   vitamin C 500 MG tablet Commonly known as: ASCORBIC ACID Take 1,000 mg by mouth daily.   vitamin E 180 MG (400 UNITS) capsule Generic drug: vitamin E Take 400 Units by mouth daily.       Allergies:  Allergies  Allergen Reactions  . Sulfa Antibiotics Swelling    Lip swelling    Family History: Family History  Problem Relation Age of Onset  . Dementia Mother    . Cerebral aneurysm Father   . Bladder Cancer Paternal Uncle   . Liver cancer Sister   . Prostate cancer Neg Hx   . Kidney cancer Neg Hx     Social History:   reports that he quit smoking about 51 years ago. His smoking use included cigars. He quit smokeless tobacco use about 16 years ago.  His smokeless tobacco use included chew. He reports that he does not drink alcohol and does not use drugs.  Physical Exam: BP 126/74   Pulse 86   Ht 6' (1.829 m)   BMI 23.06 kg/m   Constitutional:  Alert and oriented, no acute distress, nontoxic appearing  HEENT: [place], AT Cardiovascular: No clubbing, cyanosis, or edema Respiratory: Normal respiratory effort, no increased work of breathing Skin: No rashes, bruises or suspicious lesions Neurologic: Grossly intact, no focal deficits, moving all 4 extremities Psychiatric: Normal mood and affect  Laboratory Data: Results for orders placed or performed in visit on 02/09/20  CULTURE, URINE COMPREHENSIVE   Specimen: Urine   UR  Result Value Ref Range   Urine Culture, Comprehensive Preliminary report (A)    Organism ID, Bacteria Klebsiella pneumoniae (A)    Organism ID, Bacteria Comment    ANTIMICROBIAL SUSCEPTIBILITY Comment   Microscopic Examination   Urine  Result Value Ref Range   WBC, UA >30 (A) 0 - 5 /hpf   RBC 0-2 0 - 2 /hpf   Epithelial Cells (non renal) 0-10 0 - 10 /hpf   Bacteria, UA Many (A) None seen/Few  Urinalysis, Complete  Result Value Ref Range   Specific Gravity, UA 1.020 1.005 - 1.030   pH, UA 5.5 5.0 - 7.5   Color, UA Yellow Yellow   Appearance Ur Cloudy (A) Clear   Leukocytes,UA 2+ (A) Negative   Protein,UA Negative Negative/Trace   Glucose, UA Negative Negative   Ketones, UA Negative Negative   RBC, UA Negative Negative   Bilirubin, UA Negative Negative   Urobilinogen, Ur 0.2 0.2 - 1.0 mg/dL   Nitrite, UA Positive (A) Negative   Microscopic Examination See below:    Assessment & Plan:   1. Dysuria UA grossly  infected, VSS.  Will start empiric Augmentin and send for culture for further evaluation.  Recommended Foley exchange today to reduce biofilm.  Patient agreed, see separate procedure note for details. - Urinalysis, Complete - CULTURE, URINE COMPREHENSIVE - amoxicillin-clavulanate (AUGMENTIN) 875-125 MG tablet; Take 1 tablet by mouth 2 (two) times daily for 7 days.  Dispense: 14 tablet; Refill: 0   Return in about 4 weeks (around 03/08/2020) for Catheter exchange.  Debroah Loop, PA-C  [facility] [address] [place], [place] [address] [phone]

## 2020-02-09 NOTE — Progress Notes (Signed)
Cath Change/ Replacement  Patient is present today for a catheter change due to urinary retention.  83ml of water was removed from the balloon, a 16FR foley cath was removed without difficulty.  Patient was cleaned and prepped in a sterile fashion with betadine and 2% lidocaine jelly was instilled into the urethra. A 16 FR foley cath was replaced into the bladder no complications were noted Urine return was noted 71ml and urine was yellow in color. The balloon was filled with 19ml of sterile water. A leg bag was attached for drainage.  A night bag was also given to the patient and patient was given instruction on how to change from one bag to another. Patient was given proper instruction on catheter care.    Performed by: Debroah Loop, PA-C and Verlene Mayer, CMA

## 2020-02-12 ENCOUNTER — Telehealth: Payer: Self-pay | Admitting: Physician Assistant

## 2020-02-12 MED ORDER — CEFUROXIME AXETIL 250 MG PO TABS: 250.0000 mg | ORAL_TABLET | Freq: Two times a day (BID) | ORAL | 0 refills | Status: AC

## 2020-02-12 NOTE — Telephone Encounter (Signed)
Notified patient as advised. Patient voiced understanding and agreed he would stop the current ABX and pick up his new ABX.

## 2020-02-12 NOTE — Telephone Encounter (Signed)
Please contact the patient and inform him that his recent urine culture has come back with preliminary results indicating that the Augmentin I prescribed is not the best antibiotic to use for his current infection. I would like him to stop this medication and switch to Ceftin twice daily x7 days. Rx sent to [facility].

## 2020-02-14 LAB — CULTURE, URINE COMPREHENSIVE

## 2020-02-15 DIAGNOSIS — G20 Parkinson's disease: Secondary | ICD-10-CM | POA: Diagnosis not present

## 2020-02-15 DIAGNOSIS — R0602 Shortness of breath: Secondary | ICD-10-CM | POA: Diagnosis not present

## 2020-02-15 DIAGNOSIS — I10 Essential (primary) hypertension: Secondary | ICD-10-CM | POA: Diagnosis not present

## 2020-02-15 DIAGNOSIS — E785 Hyperlipidemia, unspecified: Secondary | ICD-10-CM | POA: Diagnosis not present

## 2020-02-21 ENCOUNTER — Other Ambulatory Visit: Payer: Self-pay | Admitting: Family Medicine

## 2020-02-21 NOTE — Telephone Encounter (Signed)
Requested medications are due for refill today?  Yes  Requested medications are on active medication list? Yes  Last Refill:   08/22/2019  # 60 with 5 refills  Future visit scheduled? No   Notes to Clinic:  Medication failed RX refill protocol due to no labs within 360 days. Last labs were performed on 08/15/2018.

## 2020-03-06 ENCOUNTER — Ambulatory Visit: Payer: Self-pay | Admitting: Urology

## 2020-03-11 ENCOUNTER — Ambulatory Visit (INDEPENDENT_AMBULATORY_CARE_PROVIDER_SITE_OTHER): Payer: PPO | Admitting: Physician Assistant

## 2020-03-11 ENCOUNTER — Other Ambulatory Visit: Payer: Self-pay

## 2020-03-11 ENCOUNTER — Encounter: Payer: Self-pay | Admitting: Physician Assistant

## 2020-03-11 ENCOUNTER — Ambulatory Visit: Payer: Self-pay | Admitting: Physician Assistant

## 2020-03-11 VITALS — BP 120/74 | HR 80 | Ht 70.0 in | Wt 170.0 lb

## 2020-03-11 DIAGNOSIS — R300 Dysuria: Secondary | ICD-10-CM | POA: Diagnosis not present

## 2020-03-11 DIAGNOSIS — N3289 Other specified disorders of bladder: Secondary | ICD-10-CM

## 2020-03-11 DIAGNOSIS — R339 Retention of urine, unspecified: Secondary | ICD-10-CM

## 2020-03-11 MED ORDER — CEFUROXIME AXETIL 250 MG PO TABS: 250.0000 mg | ORAL_TABLET | Freq: Two times a day (BID) | ORAL | 0 refills | Status: AC

## 2020-03-11 MED ORDER — MIRABEGRON ER 50 MG PO TB24: 50.0000 mg | ORAL_TABLET | Freq: Every day | ORAL | 0 refills | Status: DC

## 2020-03-11 NOTE — Progress Notes (Signed)
Cath Change/ Replacement  Patient is present today for a catheter change due to urinary retention.  60ml of water was removed from the balloon, a 16FR foley cath was removed without difficulty.  Patient was cleaned and prepped in a sterile fashion with betadine and 2% lidocaine jelly was instilled into the urethra. A 16 FR foley cath was replaced into the bladder no complications were noted Urine return was noted 36ml and urine was yellow in color. The balloon was filled with 20ml of sterile water. A leg bag was attached for drainage.  A night bag was also given to the patient and patient was given instruction on how to change from one bag to another. Patient was given proper instruction on catheter care.    Performed by: Debroah Loop, PA-C   Additional notes: Patient reports fatigue, malodorous urine, intermittent gross hematuria, and burning with the sensation of urinary urgency and simultaneous leakage around his foley catheter. He is concerned for UTI. I explained to the patient and his son that intermittent gross hematuria and malodorous urine can be normal findings with an indwelling Foley; additionally his symptoms of burning/urgency/leakage are consistent with bladder spasms. That said, given reports of fatigue, I obtained a urine sample from his new catheter today, ordered a culture, and will start empiric Ceftin per recent culture data. Additionally, patient is no longer taking Myrbetriq 25mg  for his previously-noted bladder spasms. I provided him with Myrbetriq 50mg  samples today.  Follow up: Return in about 4 weeks (around 04/08/2020) for Catheter exchange.

## 2020-03-12 LAB — MICROSCOPIC EXAMINATION: WBC, UA: >30 /hpf — AB (ref 0–5)

## 2020-03-12 LAB — URINALYSIS, COMPLETE
Bilirubin, UA: NEGATIVE
Glucose, UA: NEGATIVE
Nitrite, UA: POSITIVE — AB
Specific Gravity, UA: 1.025 (ref 1.005–1.030)
Urobilinogen, Ur: 1 mg/dL (ref 0.2–1.0)
pH, UA: 5.5 (ref 5.0–7.5)

## 2020-03-13 LAB — CULTURE, URINE COMPREHENSIVE

## 2020-03-17 DIAGNOSIS — I10 Essential (primary) hypertension: Secondary | ICD-10-CM | POA: Diagnosis not present

## 2020-03-17 DIAGNOSIS — E785 Hyperlipidemia, unspecified: Secondary | ICD-10-CM | POA: Diagnosis not present

## 2020-03-17 DIAGNOSIS — G20 Parkinson's disease: Secondary | ICD-10-CM | POA: Diagnosis not present

## 2020-03-17 DIAGNOSIS — R0602 Shortness of breath: Secondary | ICD-10-CM | POA: Diagnosis not present

## 2020-03-19 ENCOUNTER — Telehealth: Payer: Self-pay | Admitting: *Deleted

## 2020-03-19 NOTE — Telephone Encounter (Signed)
Patient called in today and states he dont think the Myrbetriq is working. He states he is going to stop taking it. He states the medication is causing him a UTI. I advised him that the myrbetiq is not making him have a UTI.

## 2020-03-26 ENCOUNTER — Ambulatory Visit: Payer: Self-pay | Admitting: Urology

## 2020-04-02 DIAGNOSIS — G903 Multi-system degeneration of the autonomic nervous system: Secondary | ICD-10-CM | POA: Diagnosis not present

## 2020-04-02 DIAGNOSIS — G20 Parkinson's disease: Secondary | ICD-10-CM | POA: Diagnosis not present

## 2020-04-02 DIAGNOSIS — G4752 REM sleep behavior disorder: Secondary | ICD-10-CM | POA: Diagnosis not present

## 2020-04-02 DIAGNOSIS — G2581 Restless legs syndrome: Secondary | ICD-10-CM | POA: Diagnosis not present

## 2020-04-02 DIAGNOSIS — F0280 Dementia in other diseases classified elsewhere without behavioral disturbance: Secondary | ICD-10-CM | POA: Diagnosis not present

## 2020-04-02 DIAGNOSIS — R4189 Other symptoms and signs involving cognitive functions and awareness: Secondary | ICD-10-CM | POA: Diagnosis not present

## 2020-04-04 ENCOUNTER — Other Ambulatory Visit: Payer: PPO | Admitting: Adult Health Nurse Practitioner

## 2020-04-04 ENCOUNTER — Other Ambulatory Visit: Payer: Self-pay

## 2020-04-04 DIAGNOSIS — Z515 Encounter for palliative care: Secondary | ICD-10-CM | POA: Diagnosis not present

## 2020-04-04 DIAGNOSIS — G20 Parkinson's disease: Secondary | ICD-10-CM

## 2020-04-06 NOTE — Progress Notes (Signed)
[facility] Consult Note Telephone: [phone]  Fax: [phone]  PATIENT NAME: Roger Rodriguez DOB: June 22, 1941 MRN: [id number]  PRIMARY CARE PROVIDER:   [name]., MD  REFERRING PROVIDER:  Gayland Curry, [place], neurology  RESPONSIBLE PARTY:Self H: [phone] C: [phone] Roger Rodriguez, son [phone] [name], son [phone] [name], caregiver [phone]   RECOMMENDATIONS and PLAN: 1.Advanced care planning. Patient is a DNR  2.  Parkinson's disease.  Patient being followed by Dr. Manuella Rodriguez.  Patient is wheelchair bound spend most of his day sitting in chair or lying in bed.  Patient is on Rytary 61.25/245 mg 245 mg 4 times daily and Rytary 23.75/95 mg 2 tabs 4 times daily. Had recent visit with Dr. Manuella Rodriguez.  Patient started on Istradefylline 20 mg daily.  He has not started this yet as it is a newer drug and had to order it from a different pharmacy.  Went over side effect profile with the patient and have a visit in 4 weeks to see if he has gotten the medication and how he is doing on it.  Continue follow up and recommendations by neurology  3.  Sleep disturbance.  He still endorses not sleeping well and only gets about 2-3 hours of good sleep a night.  He still does not want to try anything for this.  Patient has not had any falls, infection, or hospital visits since last visit.Palliative will continue to monitor for symptom management/decline and make recommendations as needed. Have next appointment in 4 weeks.  Encouraged to call with any concerns  I spent 50 minutes providing this consultation,  from 2:00 to 2:50 including time spent with patient, chart review, provider coordination, documentation. More than 50% of the time in this consultation was spent coordinating communication.   HISTORY OF PRESENT ILLNESS:  Roger Rodriguez is a 79 y.o. year old male with multiple medical problems including  Parkinson's disease, CKD, OA, HTN, depression. Palliative Care was asked to help address goals of care. Has foley catheter in place for urinary retention. Denies headaches, pain, increased SOB or cough, N/V/D, constipation, fever.    CODE STATUS: DNR  PPS: 40% HOSPICE ELIGIBILITY/DIAGNOSIS: TBD  PHYSICAL EXAM: BP 122/82HR 76O2 97% on RA General:sitting in wheelchair in NAD Cardiovascular: regular rate and rhythm Pulmonary:lung sounds clear; normal respiratory effort Abdomen: soft, nontender, + bowel sounds VV:OHYWV catheter in place withclearyellow urinenoted in bag Extremities:trace edema to feet with more to right foot, no joint deformities Skin:no rashes on exposed skin Neurological:Patient is wheelchair bound; has some forgetfulness  PAST MEDICAL HISTORY:  Past Medical History:  Diagnosis Date  . Allergy    allergic rhinitis  . Arthritis    OA  . Chronic kidney disease    stones  . Depression   . Family history of adverse reaction to anesthesia   . Hematuria   . History of kidney stones   . Hyperlipidemia   . Hypertension    no meds since parkinsons meds started  . Incontinence of urine   . Inguinal hernia   . Neuromuscular disorder ([place])    parkinsons  . Overactive bladder   . Parkinson's disease ([place])   . Prostate cancer ([place])   . Rhinitis, allergic   . Shortness of breath dyspnea    doe secondary to parkinsons    SOCIAL HX:  Social History   Tobacco Use  . Smoking status: Former Smoker    Types: Cigars    Quit  date: 11/11/1968    Years since quitting: 51.4  . Smokeless tobacco: Former Systems developer    Types: Chew    Quit date: 11/12/2003  . Tobacco comment: Occassionally, also chewed tobacco till 2005. Quit smoking 1973  Substance Use Topics  . Alcohol use: No    Alcohol/week: 0.0 standard drinks    ALLERGIES:  Allergies  Allergen Reactions  . Sulfa Antibiotics Swelling    Lip swelling     PERTINENT MEDICATIONS:  Outpatient Encounter  Medications as of 04/04/2020  Medication Sig  . budesonide (RHINOCORT AQUA) 32 MCG/ACT nasal spray USE 1 PUFF IN EACH NOSTRIL EVERY DAY AS NEEDED (Patient taking differently: Place 1 spray into both nostrils daily as needed for allergies. )  . busPIRone (BUSPAR) 7.5 MG tablet Take 7.5 mg by mouth 2 (two) times daily. To 3 times daily  . celecoxib (CELEBREX) 100 MG capsule TAKE 1 CAPSULE BY MOUTH TWICE A DAY  . cholecalciferol (VITAMIN D) 1000 units tablet Take 1,000 Units by mouth daily.   . enalapril (VASOTEC) 5 MG tablet TAKE 1 TABLET BY MOUTH EVERY DAY (Patient taking differently: Take 2.5 mg by mouth 2 (two) times daily. )  . gabapentin (NEURONTIN) 100 MG capsule Take 200 mg by mouth 3 (three) times daily.  [name] gabapentin (NEURONTIN) 400 MG capsule Take 400 mg by mouth 3 (three) times daily.   [name] glucose blood (QUINTET AC BLOOD GLUCOSE TEST) test strip Use as instructed  . Hypromellose (ARTIFICIAL TEARS OP) Place 1 drop into both eyes 4 (four) times daily as needed (for dry eyes).   . insulin aspart (NOVOLOG) 100 UNIT/ML injection Inject 0-15 Units into the skin 3 (three) times daily with meals.  [name] loratadine (CLARITIN) 10 MG tablet Take 10 mg by mouth daily.  . metFORMIN (GLUCOPHAGE) 500 MG tablet TAKE 1 TABLET (500 MG TOTAL) BY MOUTH 2 (TWO) TIMES DAILY WITH A MEAL.  . mirabegron ER (MYRBETRIQ) 50 MG TB24 tablet Take 1 tablet (50 mg total) by mouth daily.  . Multiple Vitamin (MULTIVITAMIN WITH MINERALS) TABS tablet Take 1 tablet by mouth daily.  . polyethylene glycol (MIRALAX / GLYCOLAX) packet Take 17 g by mouth daily as needed.   . pramipexole (MIRAPEX) 0.5 MG tablet Take 2 tablets (1 mg total) by mouth at bedtime. (Patient taking differently: Take 0.25 mg by mouth 3 (three) times daily. )  . RYTARY 23.75-95 MG CPCR Take 1 capsule by mouth 4 (four) times daily.   [name] RYTARY 61.25-245 MG CPCR Take 1 capsule by mouth 4 (four) times daily.  . sertraline (ZOLOFT) 25 MG tablet Take 25 mg by mouth  daily.  . vitamin B-12 (CYANOCOBALAMIN) 1000 MCG tablet Take 1,000 mcg by mouth daily.  . vitamin C (ASCORBIC ACID) 500 MG tablet Take 1,000 mg by mouth daily.   . vitamin E (VITAMIN E) 400 UNIT capsule Take 400 Units by mouth daily.    No facility-administered encounter medications on file as of 04/04/2020.     Johanne Mcglade Jenetta Downer, NP

## 2020-04-11 ENCOUNTER — Other Ambulatory Visit: Payer: Self-pay

## 2020-04-11 ENCOUNTER — Ambulatory Visit (INDEPENDENT_AMBULATORY_CARE_PROVIDER_SITE_OTHER): Payer: PPO | Admitting: Physician Assistant

## 2020-04-11 ENCOUNTER — Encounter: Payer: Self-pay | Admitting: Physician Assistant

## 2020-04-11 VITALS — BP 135/76 | HR 92 | Ht 71.0 in | Wt 185.0 lb

## 2020-04-11 DIAGNOSIS — R339 Retention of urine, unspecified: Secondary | ICD-10-CM | POA: Diagnosis not present

## 2020-04-11 DIAGNOSIS — N3289 Other specified disorders of bladder: Secondary | ICD-10-CM

## 2020-04-11 MED ORDER — MIRABEGRON ER 25 MG PO TB24: 25.0000 mg | ORAL_TABLET | Freq: Every day | ORAL | 0 refills | Status: DC

## 2020-04-11 NOTE — Progress Notes (Signed)
Cath Change/ Replacement  Patient is present today for a catheter change due to urinary retention.  74ml of water was removed from the balloon, a 16FR foley cath was removed without difficulty.  Patient was cleaned and prepped in a sterile fashion with betadine. A 16 FR foley cath was replaced into the bladder no complications were noted Urine return was noted 9ml and urine was yellow in color. The balloon was filled with 49ml of sterile water. A leg bag was attached for drainage.   Performed by: Debroah Loop, PA-C   Additional notes: Patient no longer taking Myrbetriq 50mg . Requests 25mg  as an alternative. Samples provided today.  Follow up: Return in about 4 weeks (around 05/09/2020) for Catheter exchange.

## 2020-04-16 DIAGNOSIS — E785 Hyperlipidemia, unspecified: Secondary | ICD-10-CM | POA: Diagnosis not present

## 2020-04-16 DIAGNOSIS — R0602 Shortness of breath: Secondary | ICD-10-CM | POA: Diagnosis not present

## 2020-04-16 DIAGNOSIS — I10 Essential (primary) hypertension: Secondary | ICD-10-CM | POA: Diagnosis not present

## 2020-04-16 DIAGNOSIS — G20 Parkinson's disease: Secondary | ICD-10-CM | POA: Diagnosis not present

## 2020-04-25 DIAGNOSIS — E559 Vitamin D deficiency, unspecified: Secondary | ICD-10-CM | POA: Diagnosis not present

## 2020-04-25 DIAGNOSIS — Z79899 Other long term (current) drug therapy: Secondary | ICD-10-CM | POA: Diagnosis not present

## 2020-04-25 DIAGNOSIS — E538 Deficiency of other specified B group vitamins: Secondary | ICD-10-CM | POA: Diagnosis not present

## 2020-05-02 ENCOUNTER — Other Ambulatory Visit: Payer: PPO | Admitting: Adult Health Nurse Practitioner

## 2020-05-02 ENCOUNTER — Other Ambulatory Visit: Payer: Self-pay

## 2020-05-02 DIAGNOSIS — Z515 Encounter for palliative care: Secondary | ICD-10-CM

## 2020-05-02 DIAGNOSIS — G20 Parkinson's disease: Secondary | ICD-10-CM

## 2020-05-02 NOTE — Progress Notes (Signed)
[facility] Consult Note Telephone: [phone]  Fax: [phone]  PATIENT NAME: Roger Rodriguez DOB: [birth year] MRN: [id number]  PRIMARY CARE PROVIDER:   [name]., MD  REFERRING PROVIDER:  Gayland Curry, [place], neurology  RESPONSIBLE PARTY:Self H: [phone] C: [phone] Kahlin Mark, son [phone] Estelle Skibicki, son [phone] [name], caregiver [phone]   RECOMMENDATIONS and PLAN: 1.Advanced care planning. Patient is a DNR  2.  Functional status.  Patient is wheelchair bound.  He is continent of bowel and has foley catheter.  He is able to assist minimally with transfers.  He able to feed himself.  Continue supportive care at home.  3.  Urinary retention.  Patient has chronic foley catheter that gets changed every month.  He goes into urologist office monthly for catheter changes.  He has tried changes at home with home health and had a bad experience.  Have encouraged him to talk with urologist to start home health services for catheter changes as it is difficult for him to go to appointments.    4.  Sleep disturbance.  Patient encouraged by family members to move his hospital bed back to the bedroom instead of the living room.  States that he has been sleeping better.    Patient has not had any falls, infection, or hospital visits since last visit.Palliative will continue to monitor for symptom management/decline and make recommendations as needed. Have next appointment in6weeks.  Encouraged to call with any questions or concerns.   I spent 40 minutes providing this consultation,  from 2:00 to 2:40 including time spent with patient, chart review, provider coordination, documentation. More than 50% of the time in this consultation was spent coordinating communication.   HISTORY OF PRESENT ILLNESS:  Roger Rodriguez is a 79 y.o. year old male with multiple medical problems including  Parkinson's disease, CKD, OA, HTN, depression. Palliative Care was asked to help address goals of care. Has foley catheter in place for urinary retention. Denies headaches, pain, increased SOB or cough, N/V/D, constipation, fever.  Patient had started Istradefylline 20 mg daily and had a rash to neck and face with first dose and had to stop this.  Stated that the rash had blistered but is now healed.  Is going to try Gocovri once he goes to office and signs paperwork and the medication is supposed to be mailed to him.  Went over side effects today and encouraged to call this provider or is neurologist if he experiences any of them.  CODE STATUS: DNR  PPS: 40% HOSPICE ELIGIBILITY/DIAGNOSIS: TBD  PHYSICAL EXAM: BP 130/82  HR 87 O2 97% on RA General:sitting in wheelchair in NAD Cardiovascular: regular rate and rhythm Pulmonary:lung sounds clear; normal respiratory effort Abdomen: soft, nontender, + bowel sounds OY:DXAJO catheter in place withclearyellow urinenoted in bag Extremities:trace edema to feet with more to right foot, no joint deformities Skin:no rashes on exposed skin Neurological:Patient is wheelchair bound; has some forgetfulness  PAST MEDICAL HISTORY:  Past Medical History:  Diagnosis Date  . Allergy    allergic rhinitis  . Arthritis    OA  . Chronic kidney disease    stones  . Depression   . Family history of adverse reaction to anesthesia   . Hematuria   . History of kidney stones   . Hyperlipidemia   . Hypertension    no meds since parkinsons meds started  . Incontinence of urine   . Inguinal hernia   . Neuromuscular  disorder ([place])    parkinsons  . Overactive bladder   . Parkinson's disease ([place])   . Prostate cancer ([place])   . Rhinitis, allergic   . Shortness of breath dyspnea    doe secondary to parkinsons    SOCIAL HX:  Social History   Tobacco Use  . Smoking status: Former Smoker    Types: Cigars    Quit date: 11/11/1968    Years since  quitting: 51.5  . Smokeless tobacco: Former Systems developer    Types: Chew    Quit date: 11/12/2003  . Tobacco comment: Occassionally, also chewed tobacco till 2005. Quit smoking 1973  Substance Use Topics  . Alcohol use: No    Alcohol/week: 0.0 standard drinks    ALLERGIES:  Allergies  Allergen Reactions  . Sulfa Antibiotics Swelling    Lip swelling     PERTINENT MEDICATIONS:  Outpatient Encounter Medications as of 05/02/2020  Medication Sig  . budesonide (RHINOCORT AQUA) 32 MCG/ACT nasal spray USE 1 PUFF IN EACH NOSTRIL EVERY DAY AS NEEDED (Patient taking differently: Place 1 spray into both nostrils daily as needed for allergies. )  . busPIRone (BUSPAR) 7.5 MG tablet Take 7.5 mg by mouth 2 (two) times daily. To 3 times daily  . celecoxib (CELEBREX) 100 MG capsule TAKE 1 CAPSULE BY MOUTH TWICE A DAY  . cholecalciferol (VITAMIN D) 1000 units tablet Take 1,000 Units by mouth daily.   . enalapril (VASOTEC) 5 MG tablet TAKE 1 TABLET BY MOUTH EVERY DAY (Patient taking differently: Take 2.5 mg by mouth 2 (two) times daily. )  . gabapentin (NEURONTIN) 100 MG capsule Take 200 mg by mouth 3 (three) times daily.  [name] gabapentin (NEURONTIN) 400 MG capsule Take 400 mg by mouth 3 (three) times daily.   [name] glucose blood (QUINTET AC BLOOD GLUCOSE TEST) test strip Use as instructed  . Hypromellose (ARTIFICIAL TEARS OP) Place 1 drop into both eyes 4 (four) times daily as needed (for dry eyes).   . insulin aspart (NOVOLOG) 100 UNIT/ML injection Inject 0-15 Units into the skin 3 (three) times daily with meals.  [name] loratadine (CLARITIN) 10 MG tablet Take 10 mg by mouth daily.  . metFORMIN (GLUCOPHAGE) 500 MG tablet TAKE 1 TABLET (500 MG TOTAL) BY MOUTH 2 (TWO) TIMES DAILY WITH A MEAL.  . mirabegron ER (MYRBETRIQ) 25 MG TB24 tablet Take 1 tablet (25 mg total) by mouth daily.  . Multiple Vitamin (MULTIVITAMIN WITH MINERALS) TABS tablet Take 1 tablet by mouth daily.  [name] NOURIANZ 20 MG TABS Take 1 tablet by mouth at  bedtime.  . polyethylene glycol (MIRALAX / GLYCOLAX) packet Take 17 g by mouth daily as needed.   . pramipexole (MIRAPEX) 0.5 MG tablet Take 2 tablets (1 mg total) by mouth at bedtime. (Patient taking differently: Take 0.25 mg by mouth 3 (three) times daily. )  . RYTARY 23.75-95 MG CPCR Take 1 capsule by mouth 4 (four) times daily.   [name] RYTARY 61.25-245 MG CPCR Take 1 capsule by mouth 4 (four) times daily.  . sertraline (ZOLOFT) 25 MG tablet Take 25 mg by mouth daily.  . vitamin B-12 (CYANOCOBALAMIN) 1000 MCG tablet Take 1,000 mcg by mouth daily.  . vitamin C (ASCORBIC ACID) 500 MG tablet Take 1,000 mg by mouth daily.   . vitamin E (VITAMIN E) 400 UNIT capsule Take 400 Units by mouth daily.    No facility-administered encounter medications on file as of 05/02/2020.     Tavi Gaughran Jenetta Downer, NP

## 2020-05-03 DIAGNOSIS — G20 Parkinson's disease: Secondary | ICD-10-CM | POA: Diagnosis not present

## 2020-05-03 DIAGNOSIS — Z515 Encounter for palliative care: Secondary | ICD-10-CM | POA: Diagnosis not present

## 2020-05-08 NOTE — Progress Notes (Signed)
Cath Change/ Replacement  Patient is present today for a catheter change due to urinary retention.  9 ml of water was removed from the balloon, a 16 FR foley cath was removed with out difficulty.  Patient was cleaned and prepped in a sterile fashion with betadine. A 16 FR foley cath was replaced into the bladder no complications were noted.   Urine return was noted ml and urine was yellow clear in color. The balloon was filled with 49ml of sterile water. A leg bag was attached for drainage.  A night bag was also given to the patient and patient was given instruction on how to change from one bag to another. Patient was given proper instruction on catheter care.    Performed by: Kerman Passey, CMA and Zara Council, PA-C  Follow up: One month for Foley exchange  I spoke with Mr. Orndoff regarding placement of a suprapubic tube as it would make the catheter exchanges more comfortable for him and also reduce his risk for recurrent urinary tract infections.  He stated that he would like to think about this and receive more information in written form.  I have printed information regarding suprapubic tubes off the Internet for his review.

## 2020-05-09 ENCOUNTER — Ambulatory Visit (INDEPENDENT_AMBULATORY_CARE_PROVIDER_SITE_OTHER): Payer: PPO | Admitting: Urology

## 2020-05-09 ENCOUNTER — Other Ambulatory Visit: Payer: Self-pay

## 2020-05-09 DIAGNOSIS — R300 Dysuria: Secondary | ICD-10-CM

## 2020-05-09 DIAGNOSIS — Z466 Encounter for fitting and adjustment of urinary device: Secondary | ICD-10-CM | POA: Diagnosis not present

## 2020-05-09 NOTE — Patient Instructions (Addendum)
Suprapubic Catheter Home Guide A suprapubic catheter is a flexible tube that is used to drain urine from the bladder into a collection bag outside the body. The catheter is inserted into the bladder through a small opening in the lower abdomen, above the pubic bone (suprapubic area) and a few inches below your belly button (navel). A tiny balloon filled with germ-free (sterile) water helps to keep the catheter in place. The collection bag must be emptied at least once a day and cleaned at least every other day. The collection bag can be put beside your bed at night and attached to your leg during the day. You may have a large collection bag to use at night and a smaller one to use during the day. Your suprapubic catheter may need to be changed every 4-6 weeks, or as often as recommended by your health care provider. Healing of the tract where the catheter is placed can take 6 weeks to 6 months. During that time, your health care provider may change your catheter. Once the tract is well healed, you or a caregiver will change your suprapubic catheter at home. What are the risks? This catheter is safe to use. However, problems can occur, including:  Blocked urine flow. This can occur if the catheter stops working, or if you have a blood clot in your bladder or in the catheter.  Irritation of the skin around the catheter.  Infection. This can happen if bacteria gets into your bladder. Supplies needed:  Two pairs of sterile gloves.  Paper towels.  Catheter.  Two syringes.  Sterile water.  Sterile cleaning solution.  Lubricant.  Collection bags. How to change the catheter  1. Drink plenty of fluids during the hours before you change the catheter. 2. Wash your hands with soap and water. If soap and water are not available, use hand sanitizer. 3. Draw up sterile water into a syringe to have ready to fill the new catheter balloon. The amount will depend on the size of the balloon. 4. Have  all of your supplies ready and close to you on a paper towel. 5. Lie on your back, sitting slightly upright so that you can see the catheter and opening. 6. Put on sterile gloves. 7. Clean the skin around the catheter opening using the sterile cleaning solution. 8. Remove the water from the balloon in the catheter using a syringe. 9. Slowly remove the catheter. If the catheter seems stuck, or if you have difficulty removing it: ? Do not pull on it. ? Call your health care provider right away. 10. Place the old catheter on a paper towel to discard later. 11. Take off the used gloves, and put on a new pair. 12. Put lubricant on the end of the new catheter that will go into your bladder. 13. Clean the skin around the catheter opening using the sterile cleaning solution. 14. Gently slide the catheter through the opening in your abdomen and into the tract that leads to your bladder. 15. Wait for some urine to start flowing through the catheter. 16. When urine starts to flow through the catheter, attach the collection bag to the end of the catheter. Make sure the connection is tight. 17. Use a syringe to fill the catheter balloon with sterile water. Fill to the amount directed by your health care provider. 18. Remove the gloves and wash your hands with soap and water. How to care for the skin around the catheter Follow your health care provider's instructions on  caring for your skin.  Use a clean washcloth and soapy water to clean the skin around your catheter every day. Pat the area dry with a clean paper towel.  Do not pull on the catheter.  Do not use ointment or lotion on this area, unless told by your health care provider.  Check the skin around the catheter every day for signs of infection. Check for: ? Redness, swelling, or pain. ? Fluid or blood. ? Warmth. ? Pus or a bad smell. How to empty and clean the collection bag Empty the large collection bag every 8 hours. Empty the small  collection bag when it is about ? full. Clean the collection bag every 2-3 days, or as often as told by your health care provider. To do this: 1. Wash your hands with soap and water. If soap and water are not available, use hand sanitizer. 2. Disconnect the bag from the catheter and immediately attach a new bag to the catheter. 3. Hold the used bag over the toilet or another container. 4. Turn the valve (spigot) at the bottom of the bag to empty the urine. Empty the used bag completely. ? Do not touch the opening of the spigot. ? Do not let the opening touch the toilet or container. 5. Close the spigot tightly when the bag is empty. 6. Clean the used bag in one of the following methods: ? According to the manufacturer's instructions. ? As told by your health care provider. 7. Let the bag dry completely. Put it in a clean plastic bag before storing it. General tips   Always wash your hands before and after caring for your catheter and collection bag. Use a mild, fragrance-free soap. If soap and water are not available, use hand sanitizer.  Clean the outside of the catheter with soap and water as often as told by your health care provider.  Always make sure there are no twists or kinks in the catheter tube.  Always make sure there are no leaks in the catheter or collection bag.  Always wear the leg bag below your knee.  Make sure the overnight drainage bag is always lower than the level of your bladder, but do not let it touch the floor. Before you go to sleep, hang the bag inside a wastebasket that is covered by a clean plastic bag.  Drink enough fluid to keep your urine pale yellow.  Do not take baths, swim, or use a hot tub until your health care provider approves. Ask your health care provider if you may take showers. Contact a heath care provider if:  You leak urine.  You have redness, swelling, or pain around your catheter.  You have fluid or blood coming from your catheter  opening.  Your catheter opening feels warm to the touch.  You have pus or a bad smell coming from your catheter opening.  You have a fever or chills.  Your urine flow slows down.  Your urine becomes cloudy or smelly. Get help right away if:  Your catheter comes out.  You have: ? Nausea. ? Back pain. ? Difficulty changing your catheter. ? Blood in your urine. ? No urine flow for 1 hour. Summary  A suprapubic catheter is a flexible tube that is used to drain urine from the bladder into a collection bag outside the body.  Your suprapubic catheter may need to be changed every 4-6 weeks, or as recommended by your health care provider.  Follow instructions on how to  change the catheter and how to empty and clean the collection bag.  Always wash your hands before and after caring for your catheter and collection bag. Drink enough fluid to keep your urine pale yellow.  Get help right away if you have difficulty changing your catheter or if there is blood in your urine. This information is not intended to replace advice given to you by your health care provider. Make sure you discuss any questions you have with your health care provider. Document Revised: 10/20/2018 Document Reviewed: 08/03/2018 Elsevier Patient Education  [place].

## 2020-05-10 LAB — URINALYSIS, COMPLETE
Bilirubin, UA: NEGATIVE
Glucose, UA: NEGATIVE
Ketones, UA: NEGATIVE
Nitrite, UA: POSITIVE — AB
Protein,UA: NEGATIVE
Specific Gravity, UA: 1.015 (ref 1.005–1.030)
Urobilinogen, Ur: 0.2 mg/dL (ref 0.2–1.0)
pH, UA: 5.5 (ref 5.0–7.5)

## 2020-05-10 LAB — MICROSCOPIC EXAMINATION: WBC, UA: >30 /hpf — AB (ref 0–5)

## 2020-05-13 ENCOUNTER — Telehealth: Payer: Self-pay | Admitting: Family Medicine

## 2020-05-13 LAB — CULTURE, URINE COMPREHENSIVE

## 2020-05-13 MED ORDER — CEFUROXIME AXETIL 250 MG PO TABS: 250.0000 mg | ORAL_TABLET | Freq: Two times a day (BID) | ORAL | 0 refills | Status: DC

## 2020-05-13 NOTE — Telephone Encounter (Signed)
Patient notified and voiced understanding.

## 2020-05-13 NOTE — Telephone Encounter (Signed)
-----   Message from Nori Riis, PA-C sent at 05/13/2020  9:50 AM EDT ----- Please let Mr. Platts know that his urine culture was positive for infection and we need him to start Ceftin 250 mg twice daily x 7 days.

## 2020-05-17 DIAGNOSIS — G20 Parkinson's disease: Secondary | ICD-10-CM | POA: Diagnosis not present

## 2020-05-17 DIAGNOSIS — E785 Hyperlipidemia, unspecified: Secondary | ICD-10-CM | POA: Diagnosis not present

## 2020-05-17 DIAGNOSIS — I10 Essential (primary) hypertension: Secondary | ICD-10-CM | POA: Diagnosis not present

## 2020-05-17 DIAGNOSIS — R0602 Shortness of breath: Secondary | ICD-10-CM | POA: Diagnosis not present

## 2020-05-20 ENCOUNTER — Encounter: Payer: Self-pay | Admitting: Urology

## 2020-06-04 ENCOUNTER — Telehealth: Payer: Self-pay

## 2020-06-04 NOTE — Telephone Encounter (Signed)
At the request of Hanley Ben NP, phone call placed to patient in response to message received. Return VM left with call back information.

## 2020-06-13 ENCOUNTER — Ambulatory Visit: Payer: Self-pay | Admitting: Urology

## 2020-06-16 DIAGNOSIS — I10 Essential (primary) hypertension: Secondary | ICD-10-CM | POA: Diagnosis not present

## 2020-06-16 DIAGNOSIS — G20 Parkinson's disease: Secondary | ICD-10-CM | POA: Diagnosis not present

## 2020-06-16 DIAGNOSIS — E785 Hyperlipidemia, unspecified: Secondary | ICD-10-CM | POA: Diagnosis not present

## 2020-06-16 DIAGNOSIS — R0602 Shortness of breath: Secondary | ICD-10-CM | POA: Diagnosis not present

## 2020-06-17 ENCOUNTER — Telehealth: Payer: Self-pay | Admitting: Adult Health Nurse Practitioner

## 2020-06-17 NOTE — Telephone Encounter (Signed)
Received call from son requesting to reschedule tomorrow's visit due to urology visit.  Rescheduled for 06/28/20 @ 1:30 Rieley Khalsa K. Olena Heckle NP

## 2020-06-18 ENCOUNTER — Encounter: Payer: Self-pay | Admitting: Urology

## 2020-06-18 ENCOUNTER — Ambulatory Visit (INDEPENDENT_AMBULATORY_CARE_PROVIDER_SITE_OTHER): Payer: PPO | Admitting: Urology

## 2020-06-18 ENCOUNTER — Other Ambulatory Visit: Payer: PPO | Admitting: Adult Health Nurse Practitioner

## 2020-06-18 ENCOUNTER — Other Ambulatory Visit: Payer: Self-pay

## 2020-06-18 DIAGNOSIS — N281 Cyst of kidney, acquired: Secondary | ICD-10-CM

## 2020-06-18 DIAGNOSIS — Z8546 Personal history of malignant neoplasm of prostate: Secondary | ICD-10-CM | POA: Diagnosis not present

## 2020-06-18 DIAGNOSIS — R300 Dysuria: Secondary | ICD-10-CM | POA: Diagnosis not present

## 2020-06-18 NOTE — Progress Notes (Signed)
Simple Catheter Placement  Due to urinary retention patient is present today for a foley cath placement.  Patient was cleaned and prepped in a sterile fashion with betadine. A 16 FR foley catheter was inserted, urine return was noted  9 ml, urine was yellow cloudy with clumps in color.  The balloon was filled with 10cc of sterile water.  A leg bag was attached for drainage. Patient was also given a night bag to take home and was given instruction on how to change from one bag to another.  Patient was given instruction on proper catheter care.  Patient tolerated well, no complications were noted   Performed by: Zara Council, PA-C and Kyra Manges, CMA  Additional notes/ Follow up: one month for Foley exchange, RUS and cystoscopy for indwelling Foley.    PSA is drawn today for prostate cancer surveillance.  Caregiver is also concerned about a urinary tract infection as he has been experiencing fatigue, foul-smelling urine, clumps in the urine and leakage around the catheter.  He has been having micro heme as well, so we will go ahead and culture as he is going to be scheduled for instrumentation in the future.

## 2020-06-19 ENCOUNTER — Ambulatory Visit: Payer: Self-pay | Admitting: Urology

## 2020-06-19 ENCOUNTER — Telehealth: Payer: Self-pay | Admitting: Family Medicine

## 2020-06-19 LAB — MICROSCOPIC EXAMINATION: WBC, UA: >30 /hpf — AB (ref 0–5)

## 2020-06-19 LAB — URINALYSIS, COMPLETE
Bilirubin, UA: NEGATIVE
Glucose, UA: NEGATIVE
Ketones, UA: NEGATIVE
Nitrite, UA: NEGATIVE
Specific Gravity, UA: >1.03 — ABNORMAL HIGH (ref 1.005–1.030)
Urobilinogen, Ur: 0.2 mg/dL (ref 0.2–1.0)
pH, UA: 5.5 (ref 5.0–7.5)

## 2020-06-19 LAB — PSA: Prostate Specific Ag, Serum: <0.1 ng/mL (ref 0.0–4.0)

## 2020-06-19 NOTE — Telephone Encounter (Signed)
Patient notified and voiced understanding.

## 2020-06-19 NOTE — Telephone Encounter (Signed)
-----   Message from Nori Riis, PA-C sent at 06/19/2020  1:33 PM EST ----- Please let [name] know that his PSA remains undetectable and we recommend PSAs yearly.

## 2020-06-20 ENCOUNTER — Other Ambulatory Visit: Payer: Self-pay | Admitting: Radiology

## 2020-06-20 DIAGNOSIS — R339 Retention of urine, unspecified: Secondary | ICD-10-CM

## 2020-06-21 LAB — CULTURE, URINE COMPREHENSIVE

## 2020-06-24 ENCOUNTER — Telehealth: Payer: Self-pay

## 2020-06-24 NOTE — Telephone Encounter (Signed)
Patient called wanting to know culture results. He states he is still tired and has a bad headache with bad smelling urine. He denies fever, chills, nausea or vomiting.  He was encouraged to increase water intake to help with urine odor and it was explained that odor is typically due to concentrated urine and not infection.

## 2020-06-24 NOTE — Telephone Encounter (Signed)
We will have to get another UA from him as his urine culture grew out too many species and hope it turns out better this time.  In the meantime, I recommend increasing water and getting checked for COVID.

## 2020-06-24 NOTE — Telephone Encounter (Signed)
Patient notified and will have his caregiver collect the urine in a sterile cup from the catheter not the bag. He will call to be put on the lab schedule for a UA drop off.

## 2020-06-24 NOTE — Telephone Encounter (Signed)
[name] should consider getting tested for COVID as headache and fatigue can be symptoms.  Is he able to swallow liquids or does his Parkinson's interfere with liquid intake?

## 2020-06-24 NOTE — Telephone Encounter (Signed)
Spoke to patient and he states he can drink liquid and take pills.

## 2020-06-25 ENCOUNTER — Ambulatory Visit: Payer: PPO

## 2020-06-27 DIAGNOSIS — G20 Parkinson's disease: Secondary | ICD-10-CM | POA: Diagnosis not present

## 2020-06-28 ENCOUNTER — Other Ambulatory Visit: Payer: PPO | Admitting: Adult Health Nurse Practitioner

## 2020-06-28 ENCOUNTER — Other Ambulatory Visit: Payer: Self-pay

## 2020-06-28 DIAGNOSIS — R339 Retention of urine, unspecified: Secondary | ICD-10-CM

## 2020-06-28 DIAGNOSIS — G20 Parkinson's disease: Secondary | ICD-10-CM | POA: Diagnosis not present

## 2020-06-28 DIAGNOSIS — Z515 Encounter for palliative care: Secondary | ICD-10-CM | POA: Diagnosis not present

## 2020-06-28 NOTE — Progress Notes (Signed)
[facility] Consult Note Telephone: [phone]  Fax: [phone]  PATIENT NAME: Roger Rodriguez DOB: 07-15-1940 MRN: [id number]  PRIMARY CARE PROVIDER:   Jerrol Rodriguez., MD  REFERRING PROVIDER:  Gayland Rodriguez, [place], neurology  RESPONSIBLE PARTY:Self H: [phone] C: [phone] Roger Rodriguez, son [phone] Roger Rodriguez, son [phone] Roger Rodriguez, caregiver [phone]  Chief complaint: Follow-up palliative visit/urinary retention   RECOMMENDATIONS and PLAN: 1.Advanced care planning. Patient is a DNR  2.  Parkinson's.  Patient has not had any changes in functional status.  He has asked about using sit to stand lift versus Hoyer lift that he has in the home.  Attempted to answer his questions.  Patient has not noticed any changes so far with his new Parkinson's medication Gocovri but it is still early.  We will continue to monitor for efficacy.  Continue follow-up and recommendations by neurology  3.  Urinary retention.  Patient had questions about changing to suprapubic catheter.  Attempted to answer his questions.  He will further discuss with urology this coming Tuesday.  Discussed pros and cons of suprapubic versus Foley catheter.  Continue follow-up and recommendations by urology  Patient has not had any falls, infection, or hospital visits since last visit.Palliative will continue to monitor for symptom management/decline and make recommendations as needed. Have next appointment in4weeks. Encouraged to call with any questions or concerns.  I spent 40 minutes providing this consultation. More than 50% of the time in this consultation was spent coordinating communication.   HISTORY OF PRESENT ILLNESS:  Roger Rodriguez is a 79 y.o. year old male with multiple medical problems including Parkinson's disease, CKD, OA, HTN, depression. Palliative Care was asked to help address goals of care. Palliative Care  was asked to help address goals of care.  Patient has started new Parkinson's medication Gocovri for the past 5 days.  Does state that he has given him dry mouth and he has been using Biotene with some relief.  States he has not noticed much of a difference but it still too early to tell.  Patient is scheduled on Tuesday of next week to have his Foley catheter switched to suprapubic catheter.  States that his restless leg has been bothering him at night.  His pramixepole was changed from taking 3 times a day to taking 1 dose at 6 PM and 1 dose at 11 PM.  This was just changed yesterday.  Rest of 10 point ROS asked and negative.  CODE STATUS: DNR  PPS: 40% HOSPICE ELIGIBILITY/DIAGNOSIS: TBD  PHYSICAL EXAM: BP 124/72  HR 85 O2 98% on RA General:sitting in wheelchair in NAD Cardiovascular: regular rate and rhythm Pulmonary:lung sounds clear; normal respiratory effort Abdomen: soft, nontender, + bowel sounds BT:DVVOH catheter in place withclearyellow urinenoted in bag Extremities:trace edema to feet with more to right foot, no joint deformities Skin:no rashes on exposed skin Neurological:Patient is wheelchair bound; has some forgetfulness  PAST MEDICAL HISTORY:  Past Medical History:  Diagnosis Date  . Allergy    allergic rhinitis  . Arthritis    OA  . Chronic kidney disease    stones  . Depression   . Family history of adverse reaction to anesthesia   . Hematuria   . History of kidney stones   . Hyperlipidemia   . Hypertension    no meds since parkinsons meds started  . Incontinence of urine   . Inguinal hernia   . Neuromuscular disorder ([place])  parkinsons  . Overactive bladder   . Parkinson's disease ([name])   . Prostate cancer ([place])   . Rhinitis, allergic   . Shortness of breath dyspnea    doe secondary to parkinsons    SOCIAL HX:  Social History   Tobacco Use  . Smoking status: Former Smoker    Types: Cigars    Quit date: 11/11/1968    Years since quitting:  51.6  . Smokeless tobacco: Former Systems developer    Types: Chew    Quit date: 11/12/2003  . Tobacco comment: Occassionally, also chewed tobacco till 2005. Quit smoking 1973  Substance Use Topics  . Alcohol use: No    Alcohol/week: 0.0 standard drinks    ALLERGIES:  Allergies  Allergen Reactions  . Sulfa Antibiotics Swelling    Lip swelling     PERTINENT MEDICATIONS:  Outpatient Encounter Medications as of 06/28/2020  Medication Sig  . budesonide (RHINOCORT AQUA) 32 MCG/ACT nasal spray USE 1 PUFF IN EACH NOSTRIL EVERY DAY AS NEEDED (Patient taking differently: Place 1 spray into both nostrils daily as needed for allergies. )  . busPIRone (BUSPAR) 7.5 MG tablet Take 7.5 mg by mouth 2 (two) times daily. To 3 times daily  . celecoxib (CELEBREX) 100 MG capsule TAKE 1 CAPSULE BY MOUTH TWICE A DAY  . cholecalciferol (VITAMIN D) 1000 units tablet Take 1,000 Units by mouth daily.   . enalapril (VASOTEC) 5 MG tablet TAKE 1 TABLET BY MOUTH EVERY DAY (Patient taking differently: Take 2.5 mg by mouth 2 (two) times daily. )  . gabapentin (NEURONTIN) 100 MG capsule Take 200 mg by mouth 3 (three) times daily.  [name] glucose blood (QUINTET AC BLOOD GLUCOSE TEST) test strip Use as instructed  . Hypromellose (ARTIFICIAL TEARS OP) Place 1 drop into both eyes 4 (four) times daily as needed (for dry eyes).   . insulin aspart (NOVOLOG) 100 UNIT/ML injection Inject 0-15 Units into the skin 3 (three) times daily with meals.  [name] loratadine (CLARITIN) 10 MG tablet Take 10 mg by mouth daily.  . metFORMIN (GLUCOPHAGE) 500 MG tablet TAKE 1 TABLET (500 MG TOTAL) BY MOUTH 2 (TWO) TIMES DAILY WITH A MEAL.  . mirabegron ER (MYRBETRIQ) 25 MG TB24 tablet Take 1 tablet (25 mg total) by mouth daily.  . Multiple Vitamin (MULTIVITAMIN WITH MINERALS) TABS tablet Take 1 tablet by mouth daily.  [name] NOURIANZ 20 MG TABS Take 1 tablet by mouth at bedtime.  . polyethylene glycol (MIRALAX / GLYCOLAX) packet Take 17 g by mouth daily as needed.   .  pramipexole (MIRAPEX) 0.5 MG tablet Take 2 tablets (1 mg total) by mouth at bedtime. (Patient taking differently: Take 0.25 mg by mouth 3 (three) times daily. )  . RYTARY 23.75-95 MG CPCR Take 1 capsule by mouth 4 (four) times daily.   [name] RYTARY 61.25-245 MG CPCR Take 1 capsule by mouth 4 (four) times daily.  . sertraline (ZOLOFT) 25 MG tablet Take 25 mg by mouth daily.  . vitamin B-12 (CYANOCOBALAMIN) 1000 MCG tablet Take 1,000 mcg by mouth daily.  . vitamin C (ASCORBIC ACID) 500 MG tablet Take 1,000 mg by mouth daily.   . vitamin E (VITAMIN E) 400 UNIT capsule Take 400 Units by mouth daily.    No facility-administered encounter medications on file as of 06/28/2020.     Kase Shughart Jenetta Downer, NP

## 2020-07-01 ENCOUNTER — Other Ambulatory Visit: Payer: Self-pay | Admitting: Student

## 2020-07-02 ENCOUNTER — Ambulatory Visit: Payer: PPO

## 2020-07-10 ENCOUNTER — Other Ambulatory Visit: Payer: Self-pay | Admitting: Neurology

## 2020-07-10 DIAGNOSIS — R1310 Dysphagia, unspecified: Secondary | ICD-10-CM

## 2020-07-10 DIAGNOSIS — G20 Parkinson's disease: Secondary | ICD-10-CM

## 2020-07-15 ENCOUNTER — Ambulatory Visit (INDEPENDENT_AMBULATORY_CARE_PROVIDER_SITE_OTHER): Payer: PPO | Admitting: Family Medicine

## 2020-07-15 ENCOUNTER — Other Ambulatory Visit: Payer: Self-pay

## 2020-07-15 DIAGNOSIS — R339 Retention of urine, unspecified: Secondary | ICD-10-CM

## 2020-07-15 NOTE — Progress Notes (Signed)
Patient presents today for a UA, UCX. Patient has a catheter, the leg bag was taken off the catheter, the catheter was plugged. Patient was given water and after 15 minutes urine was collected.

## 2020-07-16 LAB — URINALYSIS, COMPLETE
Bilirubin, UA: NEGATIVE
Glucose, UA: NEGATIVE
Ketones, UA: NEGATIVE
Nitrite, UA: NEGATIVE
Protein,UA: NEGATIVE
Specific Gravity, UA: 1.01 (ref 1.005–1.030)
Urobilinogen, Ur: 0.2 mg/dL (ref 0.2–1.0)
pH, UA: 6 (ref 5.0–7.5)

## 2020-07-16 LAB — MICROSCOPIC EXAMINATION
Epithelial Cells (non renal): NONE SEEN /hpf (ref 0–10)
RBC, Urine: NONE SEEN /hpf (ref 0–2)
WBC, UA: >30 /hpf — ABNORMAL HIGH (ref 0–5)

## 2020-07-17 ENCOUNTER — Other Ambulatory Visit: Payer: Self-pay | Admitting: Urology

## 2020-07-17 DIAGNOSIS — I10 Essential (primary) hypertension: Secondary | ICD-10-CM | POA: Diagnosis not present

## 2020-07-17 DIAGNOSIS — E785 Hyperlipidemia, unspecified: Secondary | ICD-10-CM | POA: Diagnosis not present

## 2020-07-17 DIAGNOSIS — R0602 Shortness of breath: Secondary | ICD-10-CM | POA: Diagnosis not present

## 2020-07-17 DIAGNOSIS — G20 Parkinson's disease: Secondary | ICD-10-CM | POA: Diagnosis not present

## 2020-07-18 LAB — CULTURE, URINE COMPREHENSIVE

## 2020-07-19 ENCOUNTER — Telehealth: Payer: Self-pay | Admitting: Family Medicine

## 2020-07-19 MED ORDER — CEFDINIR 300 MG PO CAPS: 300.0000 mg | ORAL_CAPSULE | Freq: Two times a day (BID) | ORAL | 0 refills | Status: DC

## 2020-07-19 NOTE — Telephone Encounter (Signed)
Patient notified and voiced understanding. ABX sent to pharmacy.  

## 2020-07-19 NOTE — Telephone Encounter (Signed)
-----   Message from Debroah Loop, [place] sent at 07/19/2020  1:12 PM EST ----- Please send in [place] 300 mg twice daily x7 days in advance of SP tube placement and notify patient. ----- Message ----- From: Interface, [facility] Lab Results In Sent: 07/16/2020   5:37 AM EST To: Debroah Loop, PA-C

## 2020-07-19 NOTE — Progress Notes (Incomplete)
Patient is on schedule

## 2020-07-22 NOTE — Progress Notes (Signed)
Called and spoke with pt. Son Americus Perkey. Pre procedural instructions given . Instructions reviewed to remain NPO after MN 07/23/20, hold metformin and any insulin, to report to [facility] mall at 09:30 for 10:30 AM start time, to come with a ride for post care. Asked to leave valuables at home. Pt. Son states pt. On no blood thinners and verbalized understanding of all instructions given. Son Raju Coppolino to bring pt. To hospital 07/25/2020 for SPT placement.

## 2020-07-23 ENCOUNTER — Other Ambulatory Visit: Payer: Self-pay | Admitting: Student

## 2020-07-24 ENCOUNTER — Other Ambulatory Visit: Payer: Self-pay | Admitting: Radiology

## 2020-07-24 ENCOUNTER — Ambulatory Visit
Admission: RE | Admit: 2020-07-24 | Discharge: 2020-07-24 | Disposition: A | Discharge status: Home / Self Care | Payer: PPO | Source: Ambulatory Visit | Attending: Urology | Admitting: Urology

## 2020-07-24 ENCOUNTER — Other Ambulatory Visit: Payer: Self-pay

## 2020-07-24 DIAGNOSIS — R339 Retention of urine, unspecified: Secondary | ICD-10-CM

## 2020-07-24 DIAGNOSIS — E119 Type 2 diabetes mellitus without complications: Secondary | ICD-10-CM | POA: Insufficient documentation

## 2020-07-24 DIAGNOSIS — Z791 Long term (current) use of non-steroidal anti-inflammatories (NSAID): Secondary | ICD-10-CM | POA: Insufficient documentation

## 2020-07-24 DIAGNOSIS — Z435 Encounter for attention to cystostomy: Secondary | ICD-10-CM | POA: Diagnosis not present

## 2020-07-24 HISTORY — DX: Parkinson's disease: G20

## 2020-07-24 HISTORY — DX: Parkinson's disease without dyskinesia, without mention of fluctuations: G20.A1

## 2020-07-24 HISTORY — DX: Type 2 diabetes mellitus without complications: E11.9

## 2020-07-24 LAB — PROTIME-INR
INR: 1 (ref 0.8–1.2)
Prothrombin Time: 13 seconds (ref 11.4–15.2)

## 2020-07-24 LAB — GLUCOSE, CAPILLARY: Glucose-Capillary: 138 mg/dL — ABNORMAL HIGH (ref 70–99)

## 2020-07-24 LAB — CBC
HCT: 43.7 % (ref 39.0–52.0)
Hemoglobin: 14.3 g/dL (ref 13.0–17.0)
MCH: 30.9 pg (ref 26.0–34.0)
MCHC: 32.7 g/dL (ref 30.0–36.0)
MCV: 94.4 fL (ref 80.0–100.0)
Platelets: 226 10*3/uL (ref 150–400)
RBC: 4.63 MIL/uL (ref 4.22–5.81)
RDW: 13.4 % (ref 11.5–15.5)
WBC: 6.4 10*3/uL (ref 4.0–10.5)
nRBC: 0 % (ref 0.0–0.2)

## 2020-07-24 MED ORDER — FENTANYL CITRATE (PF) 100 MCG/2ML IJ SOLN
INTRAMUSCULAR | Status: AC
  Filled 2020-07-24: qty 2

## 2020-07-24 MED ORDER — SODIUM CHLORIDE 0.9 % IV SOLN: INTRAVENOUS | Status: DC

## 2020-07-24 MED ORDER — FENTANYL CITRATE (PF) 100 MCG/2ML IJ SOLN
INTRAMUSCULAR | Status: AC | PRN
  Administered 2020-07-24: 50 ug via INTRAVENOUS

## 2020-07-24 MED ORDER — CIPROFLOXACIN IN D5W 400 MG/200ML IV SOLN
400.0000 mg | Freq: Once | INTRAVENOUS | Status: DC
  Filled 2020-07-24: qty 200

## 2020-07-24 MED ORDER — CIPROFLOXACIN IN D5W 400 MG/200ML IV SOLN: 400.0000 mg | INTRAVENOUS | Status: DC

## 2020-07-24 MED ORDER — MIDAZOLAM HCL 2 MG/2ML IJ SOLN
INTRAMUSCULAR | Status: AC
  Filled 2020-07-24: qty 2

## 2020-07-24 MED ORDER — MIDAZOLAM HCL 2 MG/2ML IJ SOLN
INTRAMUSCULAR | Status: AC | PRN
  Administered 2020-07-24: 1 mg via INTRAVENOUS

## 2020-07-24 NOTE — Discharge Instructions (Signed)
Suprapubic Catheter Home Guide  A suprapubic catheter is a flexible tube that is used to drain urine from the bladder into a collection bag outside the body. The catheter is inserted into the bladder through a small opening in the lower abdomen, above the pubic bone (suprapubic area) and a few inches below your belly button (navel). A tiny balloon filled with germ-free (sterile) water helps to keep the catheter in place.  The collection bag must be emptied at least once a day and cleaned at least every other day. The collection bag can be put beside your bed at night and attached to your leg during the day. You may have a large collection bag to use at night and a smaller one to use during the day.  Your suprapubic catheter may need to be changed every 4-6 weeks, or as often as recommended by your health care provider. Healing of the tract where the catheter is placed can take 6 weeks to 6 months. During that time, your health care provider may change your catheter. Once the tract is well healed, you or a caregiver will change your suprapubic catheter at home.  What are the risks?  This catheter is safe to use. However, problems can occur, including:  · Blocked urine flow. This can occur if the catheter stops working, or if you have a blood clot in your bladder or in the catheter.  · Irritation of the skin around the catheter.  · Infection. This can happen if bacteria gets into your bladder.  Supplies needed:  · Two pairs of sterile gloves.  · Paper towels.  · Catheter.  · Two syringes.  · Sterile water.  · Sterile cleaning solution.  · Lubricant.  · Collection bags.  How to change the catheter    1. Drink plenty of fluids during the hours before you change the catheter.  2. Wash your hands with soap and water. If soap and water are not available, use hand sanitizer.  3. Draw up sterile water into a syringe to have ready to fill the new catheter balloon. The amount will depend on the size of the balloon.  4. Have  all of your supplies ready and close to you on a paper towel.  5. Lie on your back, sitting slightly upright so that you can see the catheter and opening.  6. Put on sterile gloves.  7. Clean the skin around the catheter opening using the sterile cleaning solution.  8. Remove the water from the balloon in the catheter using a syringe.  9. Slowly remove the catheter. If the catheter seems stuck, or if you have difficulty removing it:  ? Do not pull on it.  ? Call your health care provider right away.  10. Place the old catheter on a paper towel to discard later.  11. Take off the used gloves, and put on a new pair.  12. Put lubricant on the end of the new catheter that will go into your bladder.  13. Clean the skin around the catheter opening using the sterile cleaning solution.  14. Gently slide the catheter through the opening in your abdomen and into the tract that leads to your bladder.  15. Wait for some urine to start flowing through the catheter.  16. When urine starts to flow through the catheter, attach the collection bag to the end of the catheter. Make sure the connection is tight.  17. Use a syringe to fill the catheter balloon with sterile water.   Fill to the amount directed by your health care provider.  18. Remove the gloves and wash your hands with soap and water.  How to care for the skin around the catheter  Follow your health care provider's instructions on caring for your skin.  · Use a clean washcloth and soapy water to clean the skin around your catheter every day. Pat the area dry with a clean paper towel.  · Do not pull on the catheter.  · Do not use ointment or lotion on this area, unless told by your health care provider.  · Check the skin around the catheter every day for signs of infection. Check for:  ? Redness, swelling, or pain.  ? Fluid or blood.  ? Warmth.  ? Pus or a bad smell.  How to empty and clean the collection bag  Empty the large collection bag every 8 hours. Empty the small  collection bag when it is about ? full. Clean the collection bag every 2-3 days, or as often as told by your health care provider. To do this:  1. Wash your hands with soap and water. If soap and water are not available, use hand sanitizer.  2. Disconnect the bag from the catheter and immediately attach a new bag to the catheter.  3. Hold the used bag over the toilet or another container.  4. Turn the valve (spigot) at the bottom of the bag to empty the urine. Empty the used bag completely.  ? Do not touch the opening of the spigot.  ? Do not let the opening touch the toilet or container.  5. Close the spigot tightly when the bag is empty.  6. Clean the used bag in one of the following methods:  ? According to the manufacturer's instructions.  ? As told by your health care provider.  7. Let the bag dry completely. Put it in a clean plastic bag before storing it.  General tips    · Always wash your hands before and after caring for your catheter and collection bag. Use a mild, fragrance-free soap. If soap and water are not available, use hand sanitizer.  · Clean the outside of the catheter with soap and water as often as told by your health care provider.  · Always make sure there are no twists or kinks in the catheter tube.  · Always make sure there are no leaks in the catheter or collection bag.  · Always wear the leg bag below your knee.  · Make sure the overnight drainage bag is always lower than the level of your bladder, but do not let it touch the floor. Before you go to sleep, hang the bag inside a wastebasket that is covered by a clean plastic bag.  · Drink enough fluid to keep your urine pale yellow.  · Do not take baths, swim, or use a hot tub until your health care provider approves. Ask your health care provider if you may take showers.  Contact a heath care provider if:  · You leak urine.  · You have redness, swelling, or pain around your catheter.  · You have fluid or blood coming from your catheter  opening.  · Your catheter opening feels warm to the touch.  · You have pus or a bad smell coming from your catheter opening.  · You have a fever or chills.  · Your urine flow slows down.  · Your urine becomes cloudy or smelly.  Get help right away   if:  · Your catheter comes out.  · You have:  ? Nausea.  ? Back pain.  ? Difficulty changing your catheter.  ? Blood in your urine.  ? No urine flow for 1 hour.  Summary  · A suprapubic catheter is a flexible tube that is used to drain urine from the bladder into a collection bag outside the body.  · Your suprapubic catheter may need to be changed every 4-6 weeks, or as recommended by your health care provider.  · Follow instructions on how to change the catheter and how to empty and clean the collection bag.  · Always wash your hands before and after caring for your catheter and collection bag. Drink enough fluid to keep your urine pale yellow.  · Get help right away if you have difficulty changing your catheter or if there is blood in your urine.  This information is not intended to replace advice given to you by your health care provider. Make sure you discuss any questions you have with your health care provider.  Document Revised: 09/07/2019 Document Reviewed: 08/03/2018  Elsevier Patient Education © 2021 [employer]

## 2020-07-24 NOTE — Procedures (Signed)
Interventional Radiology Procedure:   Indications: Urinary retention  Procedure: CT guided placement of suprapubic catheter  Findings: Bladder was distended with 350 ml of saline.  Placed 16 Fr drain.     Complications: None     EBL: less than 10 ml  Plan: Discharge in 2 hours.  Switch to balloon retention catheter in IR in 6-8 weeks.    Roger Rodriguez R. Anselm Pancoast, MD  Pager: [phone]

## 2020-07-24 NOTE — H&P (Signed)
Chief Complaint: Patient was seen in consultation today for suprapubic catheter placement at the request of [name] A  Referring Physician(s): [name] [facility]  Patient Status: [facility] - Out-pt  History of Present Illness: Roger Rodriguez is a 80 y.o. male with multiple medical problems including Parkinson's disease and urinary retention.  Patient currently has a Foley catheter.  A suprapubic catheter has been requested.  Patient has no complaints at this time.  Patient has been vaccinated for COVID-19 and received a booster shot.  Patient is accompanied by his son.  Past Medical History:  Diagnosis Date  . Allergy    allergic rhinitis  . Arthritis    OA  . Chronic kidney disease    stones  . Depression   . Diabetes mellitus without complication ([name])   . Family history of adverse reaction to anesthesia   . Hematuria   . History of kidney stones   . Hyperlipidemia   . Hypertension    no meds since parkinsons meds started  . Incontinence of urine   . Inguinal hernia   . Neuromuscular disorder ([name])    parkinsons  . Overactive bladder   . Parkinson disease ([name])   . Parkinson's disease ([name])   . Prostate cancer ([name])   . Rhinitis, allergic   . Shortness of breath dyspnea    doe secondary to parkinsons    Past Surgical History:  Procedure Laterality Date  . BACK SURGERY  2014  . CYSTOSCOPY WITH STENT PLACEMENT Right 01/13/2016   Procedure: CYSTOSCOPY WITH STENT PLACEMENT;  Surgeon: Hollice Espy, MD;  Location: [facility] ORS;  Service: Urology;  Laterality: Right;  . CYSTOSCOPY/URETEROSCOPY/HOLMIUM LASER/STENT PLACEMENT Left 11/20/2015   Procedure: CYSTOSCOPY/URETEROSCOPY/HOLMIUM LASER/STENT PLACEMENT;  Surgeon: Hollice Espy, MD;  Location: [facility] ORS;  Service: Urology;  Laterality: Left;  . EYE SURGERY    . FRACTURE SURGERY      ankle   . H/O arthroscopy of right knee  2015  . H/O radical protatectomy    . HERNIA REPAIR    . JOINT REPLACEMENT     right knee   . LITHOTRIPSY  2010  . Rotator Cuff tear  1993   Repaired  . TOTAL KNEE REVISION Right 03/17/2018   Procedure: TOTAL KNEE REVISION;  Surgeon: Lovell Sheehan, MD;  Location: [facility] ORS;  Service: Orthopedics;  Laterality: Right;  . TOTAL KNEE REVISION Right 06/08/2018   Procedure: TOTAL KNEE REVISION;  Surgeon: Lovell Sheehan, MD;  Location: [facility] ORS;  Service: Orthopedics;  Laterality: Right;  . URETEROSCOPY WITH HOLMIUM LASER LITHOTRIPSY  2002?  [name] URETEROSCOPY WITH HOLMIUM LASER LITHOTRIPSY Right 01/13/2016   Procedure: URETEROSCOPY WITH HOLMIUM LASER LITHOTRIPSY;  Surgeon: Hollice Espy, MD;  Location: [facility] ORS;  Service: Urology;  Laterality: Right;  [name] VASECTOMY     History    Allergies: Sulfa antibiotics  Medications: Prior to Admission medications   Medication Sig Start Date End Date Taking? Authorizing Provider  budesonide (RHINOCORT AQUA) 32 MCG/ACT nasal spray USE 1 PUFF IN EACH NOSTRIL EVERY DAY AS NEEDED Patient taking differently: Place 1 spray into both nostrils daily as needed for allergies. 02/07/18  Yes Roger Rodriguez., MD  busPIRone (BUSPAR) 7.5 MG tablet Take 7.5 mg by mouth 2 (two) times daily. To 3 times daily 09/07/19  Yes [provider]  cefdinir (OMNICEF) 300 MG capsule Take 1 capsule (300 mg total) by mouth 2 (two) times daily. 07/19/20  Yes Rodriguez, Samantha, PA-C  celecoxib (CELEBREX) 100 MG capsule TAKE  1 CAPSULE BY MOUTH TWICE A DAY 02/21/20  Yes Roger Rodriguez., MD  cholecalciferol (VITAMIN D) 1000 units tablet Take 1,000 Units by mouth daily.  05/19/12  Yes [provider]  enalapril (VASOTEC) 5 MG tablet TAKE 1 TABLET BY MOUTH EVERY DAY Patient taking differently: Take 2.5 mg by mouth 2 (two) times daily. 08/22/19  Yes Roger Rodriguez., MD  gabapentin (NEURONTIN) 100 MG capsule Take 200 mg by mouth 3 (three) times daily. 01/08/20  Yes [provider]  glucose blood (QUINTET AC BLOOD GLUCOSE TEST) test strip Use as  instructed 08/15/18  Yes Roger Rodriguez., MD  Hypromellose (ARTIFICIAL TEARS OP) Place 1 drop into both eyes 4 (four) times daily as needed (for dry eyes).    Yes [provider]  loratadine (CLARITIN) 10 MG tablet Take 10 mg by mouth daily. 05/19/12  Yes [provider]  metFORMIN (GLUCOPHAGE) 500 MG tablet TAKE 1 TABLET (500 MG TOTAL) BY MOUTH 2 (TWO) TIMES DAILY WITH A MEAL. 08/04/19  Yes Roger Rodriguez., MD  mirabegron ER (MYRBETRIQ) 25 MG TB24 tablet Take 1 tablet (25 mg total) by mouth daily. 04/11/20  Yes Rodriguez, Samantha, PA-C  Multiple Vitamin (MULTIVITAMIN WITH MINERALS) TABS tablet Take 1 tablet by mouth daily.   Yes [provider]  NOURIANZ 20 MG TABS Take 1 tablet by mouth at bedtime. 04/10/20  Yes [provider]  polyethylene glycol (MIRALAX / GLYCOLAX) packet Take 17 g by mouth daily as needed.    Yes [provider]  pramipexole (MIRAPEX) 0.5 MG tablet Take 2 tablets (1 mg total) by mouth at bedtime. Patient taking differently: Take 0.25 mg by mouth 3 (three) times daily. 10/17/17  Yes Rodriguez, Roger Bold, MD  RYTARY 23.75-95 MG CPCR Take 1 capsule by mouth 4 (four) times daily.  01/08/18  Yes [provider]  sertraline (ZOLOFT) 25 MG tablet Take 25 mg by mouth daily. 12/03/19  Yes [provider]  vitamin B-12 (CYANOCOBALAMIN) 1000 MCG tablet Take 1,000 mcg by mouth daily.   Yes [provider]  vitamin C (ASCORBIC ACID) 500 MG tablet Take 1,000 mg by mouth daily.    Yes [provider]  vitamin E 180 MG (400 UNITS) capsule Take 400 Units by mouth daily.    Yes [provider]  insulin aspart (NOVOLOG) 100 UNIT/ML injection Inject 0-15 Units into the skin 3 (three) times daily with meals. Patient not taking: Reported on 07/24/2020 06/10/18   Roger Leys, MD  RYTARY 61.25-245 MG CPCR Take 1 capsule by mouth 4 (four) times daily. 10/20/19   [provider]     Family History   Problem Relation Age of Onset  . Dementia Mother   . Cerebral aneurysm Father   . Bladder Cancer Paternal Uncle   . Liver cancer Sister   . Prostate cancer Neg Hx   . Kidney cancer Neg Hx     Social History   Socioeconomic History  . Marital status: Widowed    Spouse name: Not on file  . Number of children: 2  . Years of education: Not on file  . Highest education level: Some college, no degree  Occupational History  . Occupation: retired  Tobacco Use  . Smoking status: Former Smoker    Types: Cigars    Quit date: 11/11/1968    Years since quitting: 51.7  . Smokeless tobacco: Former Systems developer    Types: Chew    Quit date: 11/12/2003  .  Tobacco comment: Occassionally, also chewed tobacco till 2005. Quit smoking 1973  Vaping Use  . Vaping Use: Never used  Substance and Sexual Activity  . Alcohol use: No    Alcohol/week: 0.0 standard drinks  . Drug use: No  . Sexual activity: Not Currently  Other Topics Concern  . Not on file  Social History Narrative  . Not on file   Social Determinants of Health   Financial Resource Strain: Low Risk   . Difficulty of Paying Living Expenses: Not hard at all  Food Insecurity: No Food Insecurity  . Worried About Charity fundraiser in the Last Year: Never true  . Ran Out of Food in the Last Year: Never true  Transportation Needs: No Transportation Needs  . Lack of Transportation (Medical): No  . Lack of Transportation (Non-Medical): No  Physical Activity: Inactive  . Days of Exercise per Week: 0 days  . Minutes of Exercise per Session: 0 min  Stress: No Stress Concern Present  . Feeling of Stress : Only a little  Social Connections: Socially Isolated  . Frequency of Communication with Friends and Family: More than three times a week  . Frequency of Social Gatherings with Friends and Family: More than three times a week  . Attends Religious Services: Never  . Active Member of Clubs or Organizations: No  . Attends Archivist  Meetings: Never  . Marital Status: Widowed     Review of Systems  Constitutional: Negative.   Respiratory: Negative.   Gastrointestinal: Negative.   Genitourinary:       Urinary retention    Vital Signs: BP 106/71   Pulse 80   Temp 97.6 F (36.4 C) (Oral)   Resp 20   SpO2 96%   Physical Exam HENT:     Head:     Comments: Decreased hearing. Cardiovascular:     Rate and Rhythm: Normal rate and regular rhythm.  Pulmonary:     Effort: Pulmonary effort is normal.     Breath sounds: Normal breath sounds.  Abdominal:     General: Abdomen is flat.     Palpations: Abdomen is soft.  Neurological:     Mental Status: He is alert.     Imaging: No results found.  Labs:  CBC: No results for input(s): WBC, HGB, HCT, PLT in the last 8760 hours.  COAGS: No results for input(s): INR, APTT in the last 8760 hours.  BMP: No results for input(s): NA, K, CL, CO2, GLUCOSE, BUN, CALCIUM, CREATININE, GFRNONAA, GFRAA in the last 8760 hours.  Invalid input(s): CMP  LIVER FUNCTION TESTS: No results for input(s): BILITOT, AST, ALT, ALKPHOS, PROT, ALBUMIN in the last 8760 hours.  TUMOR MARKERS: No results for input(s): AFPTM, CEA, CA199, CHROMGRNA in the last 8760 hours.  Assessment and Plan:  80 year old with multiple medical problems including urinary retention.  Patient currently has a Foley catheter and transitioning to a suprapubic catheter.  Placement of a suprapubic catheter with CT guidance was discussed with the patient and son.  The risks include bleeding, infection and damage to adjacent structures.  We discussed long-term care of the catheter including routine exchanges.  Eventually, the tube can be exchanged for a balloon retention catheter that could be changed at urology clinic.  Informed consent was obtained the patient.  Plan for CT-guided suprapubic catheter placement with moderate sedation.  Thank you for this interesting consult.  I greatly enjoyed meeting CALLOWAY LISZKA and look forward to participating in  their care.  A copy of this report was sent to the requesting provider on this date.  Electronically Signed: Burman Riis, MD 07/24/2020, 10:29 AM   I spent a total of  15 Minutes   in face to face in clinical consultation, greater than 50% of which was counseling/coordinating care for CT-guided suprapubic catheter placement.

## 2020-07-25 ENCOUNTER — Other Ambulatory Visit: Payer: PPO | Admitting: Adult Health Nurse Practitioner

## 2020-07-25 DIAGNOSIS — M7051 Other bursitis of knee, right knee: Secondary | ICD-10-CM | POA: Diagnosis not present

## 2020-07-25 DIAGNOSIS — G20 Parkinson's disease: Secondary | ICD-10-CM

## 2020-07-25 DIAGNOSIS — Z515 Encounter for palliative care: Secondary | ICD-10-CM

## 2020-07-25 DIAGNOSIS — R339 Retention of urine, unspecified: Secondary | ICD-10-CM

## 2020-07-25 NOTE — Progress Notes (Signed)
[facility] Consult Note Telephone: [phone]  Fax: [phone]  PATIENT NAME: Roger Rodriguez DOB: Apr 26, 1941 MRN: [id number]  PRIMARY CARE PROVIDER:   [name]., MD  REFERRING PROVIDER: Gayland Curry, [place], neurology  RESPONSIBLE PARTY:Self H: [phone]  Roger Rodriguez, son [phone] Roger Rodriguez, son [phone] Roger Rodriguez, caregiver [phone]  Chief complaint: Follow-up palliative visit/right knee pain  Called prior to visit and performed COVID screening   RECOMMENDATIONS and PLAN: 1.Advanced care planning. Patient is a DNR  2.  Parkinson's.  Patient doing well with Gocovri.  Continue follow up and recommendations by neurology  3.  Urinary retention. Transition to suprapubic catheter went well.  No pain at site.  No hematuria.  Catheter draining well.  Continue follow up and recommendations by urology.  4.  Right knee bursitis.  Pain in right knee most likely bursitis.  Have suggested using cold compresses  for a couple of days to help with the swelling and topical analgesics for the pain.  Can try warm compresses after first couple of days to help with pain relief.    Palliative will continue to monitor for symptom management/decline and make recommendations as needed. Have next appointment in4weeks. Encouraged to call with anyquestions or concerns.  HISTORY OF PRESENT ILLNESS:  Roger Rodriguez is a 80 y.o. year old male with multiple medical problems including Parkinson's disease, CKD, OA, HTN, depression. Palliative Care was asked to help address goals of care. Patient not having any side effects other than dry mouth with the Gocovri for his Parkinson's.  He is getting some relief with Biotene and extra fluids.  States his restless leg is better.  Complains of pain in right knee that started yesterday and is achy.  Denies falls or trauma but states that he was transferring in and out of  vehicle a lot yesterday. Has not tried anything to relieve the pain.  States Tylenol causes constipation. Yesterday patient had foley catheter replaced with suprapubic catheter.  Denies any pain at the site and has clear yellow urine noted in bag with no hematuria.  Rest of 10 point ROS asked and negative.  CODE STATUS: DNR  PPS: 40% HOSPICE ELIGIBILITY/DIAGNOSIS: TBD  PHYSICAL EXAM:  BP 118/72  HR 87  O2 97% on RA General:sitting in wheelchair in NAD Cardiovascular: regular rate and rhythm Pulmonary:lung sounds clear; normal respiratory effort Abdomen: soft, nontender, + bowel sounds OV:ZCHYIFOYDX catheter in place withclearyellow urinenoted in bag; bandage to suprapubic site is clean and dry with no blood or discharge noted Extremities:trace edema to feet with more to right foot, patient has swelling to right knee with tenderness upon palpation Skin:no rashes on exposed skin Neurological:Patient is wheelchair bound; has some forgetfulness  PAST MEDICAL HISTORY:  Past Medical History:  Diagnosis Date  . Allergy    allergic rhinitis  . Arthritis    OA  . Chronic kidney disease    stones  . Depression   . Diabetes mellitus without complication ([place])   . Family history of adverse reaction to anesthesia   . Hematuria   . History of kidney stones   . Hyperlipidemia   . Hypertension    no meds since parkinsons meds started  . Incontinence of urine   . Inguinal hernia   . Neuromuscular disorder ([place])    parkinsons  . Overactive bladder   . Parkinson disease ([place])   . Parkinson's disease ([place])   . Prostate cancer ([place])   .  Rhinitis, allergic   . Shortness of breath dyspnea    doe secondary to parkinsons    SOCIAL HX:  Social History   Tobacco Use  . Smoking status: Former Smoker    Types: Cigars    Quit date: 11/11/1968    Years since quitting: 51.7  . Smokeless tobacco: Former Systems developer    Types: Chew    Quit date: 11/12/2003  . Tobacco comment: Occassionally,  also chewed tobacco till 2005. Quit smoking 1973  Substance Use Topics  . Alcohol use: No    Alcohol/week: 0.0 standard drinks    ALLERGIES:  Allergies  Allergen Reactions  . Sulfa Antibiotics Swelling    Lip swelling     PERTINENT MEDICATIONS:  Outpatient Encounter Medications as of 07/25/2020  Medication Sig  . budesonide (RHINOCORT AQUA) 32 MCG/ACT nasal spray USE 1 PUFF IN EACH NOSTRIL EVERY DAY AS NEEDED (Patient taking differently: Place 1 spray into both nostrils daily as needed for allergies.)  . busPIRone (BUSPAR) 7.5 MG tablet Take 7.5 mg by mouth 2 (two) times daily. To 3 times daily  . cefdinir (OMNICEF) 300 MG capsule Take 1 capsule (300 mg total) by mouth 2 (two) times daily.  . celecoxib (CELEBREX) 100 MG capsule TAKE 1 CAPSULE BY MOUTH TWICE A DAY  . cholecalciferol (VITAMIN D) 1000 units tablet Take 1,000 Units by mouth daily.   . enalapril (VASOTEC) 5 MG tablet TAKE 1 TABLET BY MOUTH EVERY DAY (Patient taking differently: Take 2.5 mg by mouth 2 (two) times daily.)  . gabapentin (NEURONTIN) 100 MG capsule Take 200 mg by mouth 3 (three) times daily.  [name] glucose blood (QUINTET AC BLOOD GLUCOSE TEST) test strip Use as instructed  . Hypromellose (ARTIFICIAL TEARS OP) Place 1 drop into both eyes 4 (four) times daily as needed (for dry eyes).   . insulin aspart (NOVOLOG) 100 UNIT/ML injection Inject 0-15 Units into the skin 3 (three) times daily with meals. (Patient not taking: Reported on 07/24/2020)  . loratadine (CLARITIN) 10 MG tablet Take 10 mg by mouth daily.  . metFORMIN (GLUCOPHAGE) 500 MG tablet TAKE 1 TABLET (500 MG TOTAL) BY MOUTH 2 (TWO) TIMES DAILY WITH A MEAL.  . mirabegron ER (MYRBETRIQ) 25 MG TB24 tablet Take 1 tablet (25 mg total) by mouth daily.  . Multiple Vitamin (MULTIVITAMIN WITH MINERALS) TABS tablet Take 1 tablet by mouth daily.  [name] NOURIANZ 20 MG TABS Take 1 tablet by mouth at bedtime.  . polyethylene glycol (MIRALAX / GLYCOLAX) packet Take 17 g by mouth  daily as needed.   . pramipexole (MIRAPEX) 0.5 MG tablet Take 2 tablets (1 mg total) by mouth at bedtime. (Patient taking differently: Take 0.25 mg by mouth 3 (three) times daily.)  . RYTARY 23.75-95 MG CPCR Take 1 capsule by mouth 4 (four) times daily.   [name] RYTARY 61.25-245 MG CPCR Take 1 capsule by mouth 4 (four) times daily.  . sertraline (ZOLOFT) 25 MG tablet Take 25 mg by mouth daily.  . vitamin B-12 (CYANOCOBALAMIN) 1000 MCG tablet Take 1,000 mcg by mouth daily.  . vitamin C (ASCORBIC ACID) 500 MG tablet Take 1,000 mg by mouth daily.   . vitamin E 180 MG (400 UNITS) capsule Take 400 Units by mouth daily.    No facility-administered encounter medications on file as of 07/25/2020.     Ayansh Feutz Jenetta Downer, NP

## 2020-07-31 ENCOUNTER — Telehealth: Payer: Self-pay

## 2020-07-31 ENCOUNTER — Telehealth: Payer: Self-pay | Admitting: Adult Health Nurse Practitioner

## 2020-07-31 NOTE — Telephone Encounter (Signed)
At the request of Amy NP, phone call placed to [place] to request replacement sling for patient's lift. [facility] will place order and reach out to patient with any questions or concerns.

## 2020-07-31 NOTE — Telephone Encounter (Signed)
Spoke with patient to follow up on right knee bursitis. States that the swelling has gone down but still having some pain.  Encouraged to continue topical treatments that give relief and to use heat as needed for the pain. Asking about sling for his lift.  Will reach out to RN navigator for help with reaching out to [place] with request. Amy K. Olena Heckle NP

## 2020-08-01 ENCOUNTER — Ambulatory Visit
Admission: RE | Admit: 2020-08-01 | Discharge: 2020-08-01 | Disposition: A | Discharge status: Home / Self Care | Payer: PPO | Source: Ambulatory Visit | Attending: Urology | Admitting: Urology

## 2020-08-01 ENCOUNTER — Other Ambulatory Visit: Payer: Self-pay

## 2020-08-01 DIAGNOSIS — N281 Cyst of kidney, acquired: Secondary | ICD-10-CM | POA: Diagnosis not present

## 2020-08-03 ENCOUNTER — Other Ambulatory Visit: Payer: Self-pay | Admitting: Family Medicine

## 2020-08-03 DIAGNOSIS — E119 Type 2 diabetes mellitus without complications: Secondary | ICD-10-CM

## 2020-08-06 ENCOUNTER — Other Ambulatory Visit: Payer: Self-pay | Admitting: Urology

## 2020-08-06 DIAGNOSIS — G20 Parkinson's disease: Secondary | ICD-10-CM | POA: Diagnosis not present

## 2020-08-06 DIAGNOSIS — M6281 Muscle weakness (generalized): Secondary | ICD-10-CM | POA: Diagnosis not present

## 2020-08-08 ENCOUNTER — Telehealth: Payer: Self-pay | Admitting: *Deleted

## 2020-08-08 NOTE — Telephone Encounter (Signed)
Pt and caregiver calling stating that it looks like his tube has came out just a little. Per caregiver the stitches are still in place and the tube is still draining in the bag, it just looked a little different today. I offered an appt to have shannon look at but pt declined. Caregiver states she will call if it stops draining.

## 2020-08-17 DIAGNOSIS — G20 Parkinson's disease: Secondary | ICD-10-CM | POA: Diagnosis not present

## 2020-08-17 DIAGNOSIS — E785 Hyperlipidemia, unspecified: Secondary | ICD-10-CM | POA: Diagnosis not present

## 2020-08-17 DIAGNOSIS — I10 Essential (primary) hypertension: Secondary | ICD-10-CM | POA: Diagnosis not present

## 2020-08-17 DIAGNOSIS — R0602 Shortness of breath: Secondary | ICD-10-CM | POA: Diagnosis not present

## 2020-08-21 NOTE — Progress Notes (Signed)
Suprapubic Cath Change  Patient is present today for a suprapubic catheter change due to urinary retention.  The securing string is cut from the 16Fr multipurpose drain and the drain was removed from the tract with out difficulty.  Site was cleaned and prepped in a sterile fashion with betadine.  A 16 FR foley cath was replaced into the tract no complications were noted. Urine return was noted, 10 ml of sterile water was inflated into the balloon and a leg bag was attached for drainage.  Patient tolerated well. A night bag was given to patient and proper instruction was given on how to switch bags.    Performed by: Zara Council, PA-C and Kerman Passey, CMA  Follow up: One month for SPT exchange   He states that he has been having burning when urine is passing through his penis.  His caregiver also states that the urine smells strong in the bag and he has been having some gross hematuria.  I explained that the strong smell of urine in the bag is likely due to colonization and not urinary tract infections and that the gross hematuria can be expected after SPT placement.  I will obtain a UA and urine culture due to his symptoms of dysuria.  UA nitrite positive, 11-30 WBC's, > 30 RBC's and many bacteria.   I have sent the urine for culture and started him on Keflex 500 mg twice daily for 10 days.

## 2020-08-22 ENCOUNTER — Ambulatory Visit: Payer: PPO | Admitting: Urology

## 2020-08-22 ENCOUNTER — Other Ambulatory Visit: Payer: Self-pay

## 2020-08-22 ENCOUNTER — Encounter: Payer: Self-pay | Admitting: Urology

## 2020-08-22 DIAGNOSIS — Z9359 Other cystostomy status: Secondary | ICD-10-CM

## 2020-08-22 DIAGNOSIS — R300 Dysuria: Secondary | ICD-10-CM | POA: Diagnosis not present

## 2020-08-22 LAB — URINALYSIS, COMPLETE
Bilirubin, UA: NEGATIVE
Glucose, UA: NEGATIVE
Nitrite, UA: POSITIVE — AB
Specific Gravity, UA: 1.025 (ref 1.005–1.030)
Urobilinogen, Ur: 1 mg/dL (ref 0.2–1.0)
pH, UA: 5 (ref 5.0–7.5)

## 2020-08-22 LAB — MICROSCOPIC EXAMINATION: RBC, Urine: >30 /hpf — AB (ref 0–2)

## 2020-08-22 MED ORDER — CEPHALEXIN 500 MG PO CAPS: 500.0000 mg | ORAL_CAPSULE | Freq: Two times a day (BID) | ORAL | 0 refills | Status: AC

## 2020-08-28 ENCOUNTER — Telehealth: Payer: Self-pay | Admitting: Family Medicine

## 2020-08-28 LAB — CULTURE, URINE COMPREHENSIVE

## 2020-08-28 NOTE — Telephone Encounter (Signed)
-----   Message from Nori Riis, PA-C sent at 08/28/2020  2:10 PM EST ----- Please let Mr. Rhett know that his urine culture was positive for infection and to complete the Keflex antibiotic prescription.

## 2020-08-28 NOTE — Telephone Encounter (Signed)
Patient notified and voiced understanding.

## 2020-08-30 ENCOUNTER — Other Ambulatory Visit: Payer: Self-pay | Admitting: Family Medicine

## 2020-09-02 NOTE — Telephone Encounter (Signed)
Encounter opened in error

## 2020-09-05 ENCOUNTER — Other Ambulatory Visit: Payer: PPO | Admitting: Adult Health Nurse Practitioner

## 2020-09-05 ENCOUNTER — Other Ambulatory Visit: Payer: Self-pay

## 2020-09-05 DIAGNOSIS — G20 Parkinson's disease: Secondary | ICD-10-CM

## 2020-09-05 DIAGNOSIS — L409 Psoriasis, unspecified: Secondary | ICD-10-CM

## 2020-09-05 DIAGNOSIS — Z515 Encounter for palliative care: Secondary | ICD-10-CM

## 2020-09-05 DIAGNOSIS — R339 Retention of urine, unspecified: Secondary | ICD-10-CM | POA: Diagnosis not present

## 2020-09-05 NOTE — Progress Notes (Signed)
[facility] Palliative Care Consult Note Telephone: [phone]  Fax: [phone]  PATIENT NAME: Roger Rodriguez DOB: [birth year] MRN: [id number]  PRIMARY CARE PROVIDER:   [name]., MD  REFERRING PROVIDER:  Gayland Curry, [place], neurology  RESPONSIBLE PARTY:Self H: [phone]  Ken Bonn, son [phone] Aqil Goetting, son [phone] [name], caregiver [phone]  Chief complaint: Follow-up palliative visit/right knee pain  Called prior to visit and performed COVID screening   RECOMMENDATIONS and PLAN: 1.Advanced care planning. Patient is a DNR  2.  Parkinson's.  Patient having complaints of bad dreams which could be side effect of Gocovri.  Continues to have freezing of right arm and is complaining of increased daytime tiredness.  Will update patient's neurologist on these symptoms.  3.  Urinary retention.  Patient had catheter change last week and has been treated for UTI.  Not having any UTI symptoms today.  Continue follow-up and recommendations by urology.  4.  Psoriasis.  Patient has gotten relief with cortisone cream in the past and have encouraged him to use this again for patches behind his ears.  Patient states that the back of his wheelchair is cracked and that the brakes on his wheelchair do not always tighten.  Will reach out to RN navigator to reach out to [employer] with these concerns.  Palliative will continue to monitor for symptom management/decline and make recommendations as needed. Have next appointment in6weeks. Encouraged to call with anyquestions or concerns.  I spent 40 minutes providing this consultation, including time spent with patient, provider coordination, chart review, documentation. More than 50% of the time in this consultation was spent coordinating communication.   HISTORY OF PRESENT ILLNESS:  Roger Rodriguez is a 80 y.o. year old male with multiple medical  problems including Parkinson's disease, CKD, OA, HTN, depression. Palliative Care was asked to help address goals of care.  Patient had suprapubic catheter change on 08/28/2020.  Patient also found to have UTI and was treated for this.  Today patient denies dysuria, fever, hematuria.  Caregiver states that patient has been having bad dreams.  Patient states that sometimes he remembers these dreams and they can be frightening.  Does state that sometimes these dreams do wake him up.  These dreams do not occur every night.  They do seem to be occurring at least 1 night a week.  Patient states that though he feels he sleeps fairly good at night usually getting a good 5 to 6 hours of sleep before waking up but is able to fall back to sleep most mornings, he does feel tired throughout the day.  Caregiver states that his left foot is starting to turn inwards.  Does state that about a month ago he did hit the left foot trying to get into the car for an appointment.  He denies pain of the left foot.  Patient states that the behind both ears there is a rash of been itchy.  States having history of psoriasis.  Has used cortisone cream with relief in the past.  Patient states that he still has freezing of his right arm especially when he tries to lift it up.  Rest of 10 point ROS asked and negative  CODE STATUS: DNR  PPS: 30% HOSPICE ELIGIBILITY/DIAGNOSIS: TBD  PHYSICAL EXAM:  BP 128/66 HR 84 O2 97% on RA Cardiovascular: regular rate and rhythm Pulmonary:lung sounds clear; normal respiratory effort Abdomen: soft, nontender, + bowel sounds OV:ANVBTYOMAY catheter in place withclearyellow urinenoted  in bag Extremities:trace edema to feet with more to right foot; left foot slightly inverted Skin:Does have small patchy areas behind both ears Neurological:Patient is wheelchair bound; has some forgetfulness   PAST MEDICAL HISTORY:  Past Medical History:  Diagnosis Date   Allergy    allergic rhinitis    Arthritis    OA   Chronic kidney disease    stones   Depression    Diabetes mellitus without complication (HCC)    Family history of adverse reaction to anesthesia    Hematuria    History of kidney stones    Hyperlipidemia    Hypertension    no meds since parkinsons meds started   Incontinence of urine    Inguinal hernia    Neuromuscular disorder ([place])    parkinsons   Overactive bladder    Parkinson disease (HCC)    Parkinson's disease ([place])    Prostate cancer ([place])    Rhinitis, allergic    Shortness of breath dyspnea    doe secondary to parkinsons    SOCIAL HX:  Social History   Tobacco Use   Smoking status: Former Smoker    Types: Cigars    Quit date: 11/11/1968    Years since quitting: 51.8   Smokeless tobacco: Former Systems developer    Types: Chew    Quit date: 11/12/2003   Tobacco comment: Occassionally, also chewed tobacco till 2005. Quit smoking 1973  Substance Use Topics   Alcohol use: No    Alcohol/week: 0.0 standard drinks    ALLERGIES:  Allergies  Allergen Reactions   Sulfa Antibiotics Swelling    Lip swelling     PERTINENT MEDICATIONS:  Outpatient Encounter Medications as of 09/05/2020  Medication Sig   budesonide (RHINOCORT AQUA) 32 MCG/ACT nasal spray USE 1 PUFF IN EACH NOSTRIL EVERY DAY AS NEEDED (Patient taking differently: Place 1 spray into both nostrils daily as needed for allergies.)   busPIRone (BUSPAR) 7.5 MG tablet Take 7.5 mg by mouth 2 (two) times daily. To 3 times daily   celecoxib (CELEBREX) 100 MG capsule TAKE 1 CAPSULE BY MOUTH TWICE A DAY   cholecalciferol (VITAMIN D) 1000 units tablet Take 1,000 Units by mouth daily.    enalapril (VASOTEC) 5 MG tablet TAKE 1 TABLET BY MOUTH EVERY DAY (Patient taking differently: Take 2.5 mg by mouth 2 (two) times daily.)   gabapentin (NEURONTIN) 100 MG capsule Take 200 mg by mouth 3 (three) times daily.   glucose blood (QUINTET AC BLOOD GLUCOSE TEST) test strip Use as instructed    Hypromellose (ARTIFICIAL TEARS OP) Place 1 drop into both eyes 4 (four) times daily as needed (for dry eyes).    insulin aspart (NOVOLOG) 100 UNIT/ML injection Inject 0-15 Units into the skin 3 (three) times daily with meals.   loratadine (CLARITIN) 10 MG tablet Take 10 mg by mouth daily.   metFORMIN (GLUCOPHAGE) 500 MG tablet TAKE 1 TABLET (500 MG TOTAL) BY MOUTH 2 (TWO) TIMES DAILY WITH A MEAL.   mirabegron ER (MYRBETRIQ) 25 MG TB24 tablet Take 1 tablet (25 mg total) by mouth daily.   Multiple Vitamin (MULTIVITAMIN WITH MINERALS) TABS tablet Take 1 tablet by mouth daily.   NOURIANZ 20 MG TABS Take 1 tablet by mouth at bedtime.   polyethylene glycol (MIRALAX / GLYCOLAX) packet Take 17 g by mouth daily as needed.    pramipexole (MIRAPEX) 0.5 MG tablet Take 2 tablets (1 mg total) by mouth at bedtime. (Patient taking differently: Take 0.25 mg by mouth 3 (  three) times daily.)   QUEtiapine (SEROQUEL) 25 MG tablet Take 25 mg by mouth at bedtime.   RYTARY 61.25-245 MG CPCR Take 1 capsule by mouth 4 (four) times daily.   sertraline (ZOLOFT) 25 MG tablet Take 25 mg by mouth daily.   vitamin B-12 (CYANOCOBALAMIN) 1000 MCG tablet Take 1,000 mcg by mouth daily.   vitamin C (ASCORBIC ACID) 500 MG tablet Take 1,000 mg by mouth daily.    vitamin E 180 MG (400 UNITS) capsule Take 400 Units by mouth daily.    No facility-administered encounter medications on file as of 09/05/2020.     Shelsie Tijerino Jenetta Downer, NP

## 2020-09-09 ENCOUNTER — Telehealth: Payer: Self-pay

## 2020-09-09 NOTE — Telephone Encounter (Signed)
At request of Amy NP, phone call placed to [facility] to report that patient's w/c needs repair.

## 2020-09-13 ENCOUNTER — Ambulatory Visit: Payer: PPO

## 2020-09-14 DIAGNOSIS — E785 Hyperlipidemia, unspecified: Secondary | ICD-10-CM | POA: Diagnosis not present

## 2020-09-14 DIAGNOSIS — R0602 Shortness of breath: Secondary | ICD-10-CM | POA: Diagnosis not present

## 2020-09-14 DIAGNOSIS — G20 Parkinson's disease: Secondary | ICD-10-CM | POA: Diagnosis not present

## 2020-09-14 DIAGNOSIS — I10 Essential (primary) hypertension: Secondary | ICD-10-CM | POA: Diagnosis not present

## 2020-09-18 NOTE — Progress Notes (Signed)
Suprapubic Cath Change  Patient is present today for a suprapubic catheter change due to urinary retention.  The securing string is cut from the 16Fr 2 way catheter drain and the drain was removed from the tract with out difficulty.  Site was cleaned and prepped in a sterile fashion with betadine.  A 16 FR foley cath was replaced into the tract no complications were noted. Urine return was noted, 10 ml of sterile water was inflated into the balloon and a leg bag was attached for drainage.  Patient tolerated well. A night bag was given to patient and proper instruction was given on how to switch bags.    Performed by: Zara Council, PA-C  Follow up: One month for SPT exchange

## 2020-09-19 ENCOUNTER — Ambulatory Visit: Payer: PPO | Admitting: Urology

## 2020-09-19 ENCOUNTER — Other Ambulatory Visit: Payer: Self-pay

## 2020-09-19 ENCOUNTER — Encounter: Payer: Self-pay | Admitting: Urology

## 2020-09-19 VITALS — BP 122/77 | HR 96

## 2020-09-19 DIAGNOSIS — Z9359 Other cystostomy status: Secondary | ICD-10-CM | POA: Diagnosis not present

## 2020-09-26 ENCOUNTER — Other Ambulatory Visit: Payer: Self-pay | Admitting: Family Medicine

## 2020-09-26 NOTE — Telephone Encounter (Signed)
Requested medications are due for refill today.  Unknown  Requested medications are on the active medications list.  Yes  Last refill. 08/22/2019  Future visit scheduled.   no  Notes to clinic.  Over due for visit. Rx has not been refilled since 08/22/2019

## 2020-10-15 DIAGNOSIS — I10 Essential (primary) hypertension: Secondary | ICD-10-CM | POA: Diagnosis not present

## 2020-10-15 DIAGNOSIS — R0602 Shortness of breath: Secondary | ICD-10-CM | POA: Diagnosis not present

## 2020-10-15 DIAGNOSIS — E785 Hyperlipidemia, unspecified: Secondary | ICD-10-CM | POA: Diagnosis not present

## 2020-10-15 DIAGNOSIS — G20 Parkinson's disease: Secondary | ICD-10-CM | POA: Diagnosis not present

## 2020-10-22 ENCOUNTER — Other Ambulatory Visit: Payer: PPO | Admitting: Adult Health Nurse Practitioner

## 2020-10-22 ENCOUNTER — Other Ambulatory Visit: Payer: Self-pay

## 2020-10-22 DIAGNOSIS — R339 Retention of urine, unspecified: Secondary | ICD-10-CM

## 2020-10-22 DIAGNOSIS — R5383 Other fatigue: Secondary | ICD-10-CM

## 2020-10-22 DIAGNOSIS — M7051 Other bursitis of knee, right knee: Secondary | ICD-10-CM

## 2020-10-22 DIAGNOSIS — G20 Parkinson's disease: Secondary | ICD-10-CM

## 2020-10-22 DIAGNOSIS — Z515 Encounter for palliative care: Secondary | ICD-10-CM

## 2020-10-22 NOTE — Progress Notes (Signed)
[facility] Consult Note Telephone: [phone]  Fax: [phone]    Date of encounter: 10/22/20 PATIENT NAME: LARON BOORMAN [place] [id number]   [phone] (home)  DOB: [birth year] MRN: [id number] PRIMARY CARE PROVIDER:    [name]., MD,  [address] [place] [address] [phone]  REFERRING PROVIDER:   [name]., MD [address] [place],  [place] [address] [phone]  RESPONSIBLE PARTY:    Contact Information    Name Relation Home Work [place] Son [phone]     Nathaneal, [place] [phone]         I met face to face with patient and caregiver in home. Palliative Care was asked to follow this patient by consultation request of  [name].,* to address advance care planning and complex medical decision making. This is a follow up visit.                                   ASSESSMENT AND PLAN / RECOMMENDATIONS:   Advance Care Planning/Goals of Care: Goals include to maximize quality of life and symptom management.   CODE STATUS: DNR  Symptom Management/Plan:  Fatigue: At this time unable to determine if this is related to the pollen of allergy season, tapering off his Gocovri, or disease progression.  Have set up an appointment in 4 weeks to reevaluate symptoms.  Bursitis right knee: Have suggested that he can use Tylenol for the pain.  He states that he does not want to have to take another pill.  Have suggested propping up his legs and using cold packs to help with the swelling and the pain.  This seemed to help last time he had a bursitis flareup.  Also suggested using Ace wrap to help protect the knee.  Follow up Palliative Care Visit: Palliative care will continue to follow for complex medical decision making, advance care planning, and clarification of goals. Return 4 weeks or prn.  Encouraged to  call with any questions or concerns  I spent 40 minutes providing this consultation. More than 50% of the time in this consultation was spent in counseling and care coordination.   PPS: 30%  HOSPICE ELIGIBILITY/DIAGNOSIS: TBD  Chief Complaint: Follow-up palliative visit/right knee pain  HISTORY OF PRESENT ILLNESS:  DESTYN SCHUYLER is a 80 y.o. year old male  with Parkinson's disease, CKD, OA, HTN, depression.  Patient states today that he has been having increased right knee pain and swelling over the past few days.  He has had right knee bursitis before.  He has not tried anything to help with this.  Patient states that he has been sleeping more over the past few weeks.  Also states that accompanied with the fatigue he has been having dull headaches around his eyes and increased postnasal drainage.  Patient does take Allegra and Flonase daily and tries to limit his time outside due to allergy season.  Patient has been tapering off the Gocovri as this is the medication he is unable to afford.  Patient does state that for couple of days he had tingling in both of his legs but today he is not having the tingling feeling.  He is to discuss further medication options at next visit with neurology on May 31.  Patient does state that when he is  sitting in his lift chair that he tends to lean to the right and will at times need a pillow to prop him up.  Patient had suprapubic catheter changed on 09/19/2020 and is scheduled to have his next change in 2 days.  Caregiver does state that she has been trying to encourage him to drink more water as his urine has been darker.  Denies any hematuria, fever.  Rest of 10 point ROS asked and negative except what is in HPI.  History obtained from review of EMR, interview with caregiver and Mr. Schaberg.  I reviewed available labs, medications, imaging, studies and related documents from the EMR.  Records reviewed and summarized above.    PHYSICAL EXAM:  BP 110/62 HR 97  O2  97% on RA General:sitting in wheelchair in NAD Eyes: Sclera anicteric and noninjected with no discharge noted ENMT: Moist mucous membranes Cardiovascular: regular rate and rhythm Pulmonary:lung sounds clear; normal respiratory effort Abdomen: soft, nontender, + bowel sounds AY:[id number] catheter in place withcleardark yellow urinenoted in bag; bandage to suprapubic site is clean and dry with no blood or discharge noted Extremities:trace edema to feet with more to right foot, patient has swelling to right knee with tenderness upon palpation Skin:no rashes on exposed skin Neurological:Patient is wheelchair bound; has some forgetfulness  This chart was dictated using voice recognition software. Despite best efforts to proofread, errors can occur which can change the documentation meaning.  Thank you for the opportunity to participate in the care of Mr. Tangonan.  The palliative care team will continue to follow. Please call our office at [phone] if we can be of additional assistance.    Jenetta Downer, NP , DNP,  AGPCNP-BC  COVID-19 PATIENT SCREENING TOOL Asked and negative response unless otherwise noted:   Have you had symptoms of covid, tested positive or been in contact with someone with symptoms/positive test in the past 5-10 days? negative

## 2020-10-23 NOTE — Progress Notes (Deleted)
Suprapubic Cath Change  Patient is present today for a suprapubic catheter change due to urinary retention.  ***ml of water was drained from the balloon, a ***FR foley cath was removed from the tract with out difficulty.  Site was cleaned and prepped in a sterile fashion with betadine.  A ***FR foley cath was replaced into the tract {dnt complications:20057}. Urine return was noted, 10 ml of sterile water was inflated into the balloon and a *** bag was attached for drainage.  Patient tolerated well. A night bag was given to patient and proper instruction was given on how to switch bags.    Preformed by: ***  Follow up: ***

## 2020-10-24 ENCOUNTER — Ambulatory Visit: Payer: Self-pay | Admitting: Urology

## 2020-10-24 DIAGNOSIS — Z9359 Other cystostomy status: Secondary | ICD-10-CM

## 2020-10-28 NOTE — Progress Notes (Signed)
Suprapubic Cath Change  Patient is present today for a suprapubic catheter change due to urinary retention.  9 ml of water was drained from the balloon, a 16 FR foley cath was removed from the tract with out difficulty.  Site was cleaned and prepped in a sterile fashion with betadine.  A 16 FR foley cath was replaced into the tract no complications were noted. Urine return was noted, 10 ml of sterile water was inflated into the balloon and a leg bag was attached for drainage.  Patient tolerated well.  A night bag was given to patient and proper instruction was given on how to switch bags.    Performed by: Zara Council, PA-C and Kyra Manges, CMA  Follow up: One month

## 2020-10-29 ENCOUNTER — Other Ambulatory Visit: Payer: Self-pay

## 2020-10-29 ENCOUNTER — Ambulatory Visit: Payer: PPO | Admitting: Urology

## 2020-10-29 DIAGNOSIS — Z9359 Other cystostomy status: Secondary | ICD-10-CM | POA: Diagnosis not present

## 2020-11-02 ENCOUNTER — Other Ambulatory Visit: Payer: Self-pay | Admitting: Family Medicine

## 2020-11-02 NOTE — Telephone Encounter (Signed)
Requested medication (s) are due for refill today: yes  Requested medication (s) are on the active medication list: yes  Last refill:  08/22/19   Future visit scheduled: no  Notes to clinic:  called pt and advised him that he is past due for OV. Pt stated that it is very difficult for him to come in for visit. Pt stated he has approximately 3 weeks left of med.    Requested Prescriptions  Pending Prescriptions Disp Refills   enalapril (VASOTEC) 5 MG tablet [Pharmacy Med Name: ENALAPRIL MALEATE 5 MG TABLET] 90 tablet 1    Sig: TAKE 1 TABLET BY MOUTH EVERY DAY      Cardiovascular:  ACE Inhibitors Failed - 11/02/2020  3:39 PM      Failed - Cr in normal range and within 180 days    Creatinine  Date Value Ref Range Status  04/06/2014 0.94 0.60 - 1.30 mg/dL Final   Creatinine, Ser  Date Value Ref Range Status  08/15/2018 1.05 0.76 - 1.27 mg/dL Final          Failed - K in normal range and within 180 days    Potassium  Date Value Ref Range Status  08/15/2018 4.6 3.5 - 5.2 mmol/L Final  04/06/2014 3.8 3.5 - 5.1 mmol/L Final          Failed - Valid encounter within last 6 months    Recent Outpatient Visits           1 year ago Idiopathic Parkinson's disease [facility])   [place] [name]., MD   2 years ago Diabetes mellitus without complication [facility])   [facility] [name]., MD   2 years ago Pre-op examination   [facility] [name]., MD   2 years ago Encounter for annual physical exam   [facility] [name]., MD   2 years ago Right knee pain, unspecified chronicity   [facility] [name]., MD       Future Appointments             In 3 weeks McGowan, Gordan [facility] [place] - Patient is not pregnant      Passed - Last BP in normal range    BP Readings from Last 1 Encounters:   09/19/20 122/77

## 2020-11-12 ENCOUNTER — Other Ambulatory Visit: Payer: Self-pay | Admitting: Family Medicine

## 2020-11-12 ENCOUNTER — Telehealth: Payer: Self-pay

## 2020-11-12 NOTE — Telephone Encounter (Signed)
Requested medication (s) are due for refill today - unsure  Requested medication (s) are on the active medication list -yes  Future visit scheduled -no  Last refill: 08/22/19  Notes to clinic: Attempted to call patient to schedule appointment- left message to call office for appointment.-second request for overdue medication- refused by office previous.  Requested Prescriptions  Pending Prescriptions Disp Refills   enalapril (VASOTEC) 5 MG tablet 90 tablet 1    Sig: Take 1 tablet (5 mg total) by mouth daily.      Cardiovascular:  ACE Inhibitors Failed - 11/12/2020  3:29 PM      Failed - Cr in normal range and within 180 days    Creatinine  Date Value Ref Range Status  04/06/2014 0.94 0.60 - 1.30 mg/dL Final   Creatinine, Ser  Date Value Ref Range Status  08/15/2018 1.05 0.76 - 1.27 mg/dL Final          Failed - K in normal range and within 180 days    Potassium  Date Value Ref Range Status  08/15/2018 4.6 3.5 - 5.2 mmol/L Final  04/06/2014 3.8 3.5 - 5.1 mmol/L Final          Failed - Valid encounter within last 6 months    Recent Outpatient Visits           1 year ago Idiopathic Parkinson's disease [facility])   [facility] [name]., [facility]   2 years ago Diabetes mellitus without complication [facility])   [facility] [name]., MD   2 years ago Pre-op examination   [facility] [name]., MD   2 years ago Encounter for annual physical exam   [facility] [name]., MD   2 years ago Right knee pain, unspecified chronicity   [facility] [name]., MD       Future Appointments             In 2 weeks McGowan, Gordan Payment [place] - Patient is not pregnant      Passed - Last BP in normal range    BP Readings from Last 1 Encounters:  09/19/20 122/77              Requested  Prescriptions  Pending Prescriptions Disp Refills   enalapril (VASOTEC) 5 MG tablet 90 tablet 1    Sig: Take 1 tablet (5 mg total) by mouth daily.      Cardiovascular:  ACE Inhibitors Failed - 11/12/2020  3:29 PM      Failed - Cr in normal range and within 180 days    Creatinine  Date Value Ref Range Status  04/06/2014 0.94 0.60 - 1.30 mg/dL Final   Creatinine, Ser  Date Value Ref Range Status  08/15/2018 1.05 0.76 - 1.27 mg/dL Final          Failed - K in normal range and within 180 days    Potassium  Date Value Ref Range Status  08/15/2018 4.6 3.5 - 5.2 mmol/L Final  04/06/2014 3.8 3.5 - 5.1 mmol/L Final          Failed - Valid encounter within last 6 months    Recent Outpatient Visits           1 year ago Idiopathic Parkinson's disease [facility])   [facility] [place]  Brooke Bonito., MD   2 years ago Diabetes mellitus without complication [facility])   [facility] [name]., MD   2 years ago Pre-op examination   [facility] [name]., MD   2 years ago Encounter for annual physical exam   [facility] [name]., MD   2 years ago Right knee pain, unspecified chronicity   [facility] [name]., MD       Future Appointments             In 2 weeks Ernestine Conrad, [facility] [place] - Patient is not pregnant      Passed - Last BP in normal range    BP Readings from Last 1 Encounters:  09/19/20 122/77

## 2020-11-12 NOTE — Telephone Encounter (Signed)
Copied from [name] [id number]. Topic: Appointment Scheduling - Scheduling Inquiry for Clinic >> Nov 12, 2020  3:26 PM Erick Blinks wrote: Reason for CRM: Pt called and wants to discuss his Parkinson's medication. He says someone named Bethena Roys helped him last. Please advise  Best contact: [phone]

## 2020-11-12 NOTE — Telephone Encounter (Signed)
Medication Refill - Medication: enalapril (VASOTEC) 5 MG tablet   Has the patient contacted their pharmacy? No. (Agent: If no, request that the patient contact the pharmacy for the refill.) (Agent: If yes, when and what did the pharmacy advise?)  Preferred Pharmacy (with phone number or street name):  [id number] [place], [place]  [place] [address]  Phone: [phone] Fax: [phone]     Agent: Please be advised that RX refills may take up to 3 business days. We ask that you follow-up with your pharmacy.

## 2020-11-13 MED ORDER — ENALAPRIL MALEATE 2.5 MG PO TABS: 2.5000 mg | ORAL_TABLET | Freq: Two times a day (BID) | ORAL | 3 refills | Status: DC

## 2020-11-13 NOTE — Addendum Note (Signed)
Addended by: Wilburt Finlay on: 11/13/2020 03:04 PM   Modules accepted: Orders

## 2020-11-13 NOTE — Telephone Encounter (Signed)
Spoke to patient and advised him that [name] does not prescribe this medication for him (Rytary 61.25/245mg ) and to call his neurologist to have this refilled.   Patient also mentioned that he is out of enalapril. It has been since 11/2018 since patient was seen in the office. I advised him that he MUST be seen in order to have this refilled. Patient reports that it is hard for him to get out. I advised that he is overdue for labs and Dr. Rosanna Randy wont refill until patient is seen. Due to limited appt openings and patient's transportation issues, appt was scheduled for 12/31/20.  Ok to send in refill for enalapril 5mg ? Please advise. Thanks!

## 2020-11-13 NOTE — Telephone Encounter (Signed)
Yes.  Okay to refill for 2 to 3 months.

## 2020-11-13 NOTE — Telephone Encounter (Signed)
Medication sent into the pharmacy. 

## 2020-11-15 ENCOUNTER — Telehealth: Payer: Self-pay | Admitting: Adult Health Nurse Practitioner

## 2020-11-15 NOTE — Telephone Encounter (Signed)
Spoke with patient and confirmed appt for 11/18/20 @ 1p Jamese Trauger K. Olena Heckle NP

## 2020-11-18 ENCOUNTER — Telehealth: Payer: Self-pay | Admitting: Adult Health Nurse Practitioner

## 2020-11-18 ENCOUNTER — Other Ambulatory Visit: Payer: PPO | Admitting: Adult Health Nurse Practitioner

## 2020-11-18 ENCOUNTER — Other Ambulatory Visit: Payer: Self-pay

## 2020-11-18 ENCOUNTER — Encounter: Payer: Self-pay | Admitting: Adult Health Nurse Practitioner

## 2020-11-18 VITALS — BP 118/72 | HR 89

## 2020-11-18 DIAGNOSIS — Z515 Encounter for palliative care: Secondary | ICD-10-CM | POA: Diagnosis not present

## 2020-11-18 DIAGNOSIS — R5383 Other fatigue: Secondary | ICD-10-CM | POA: Diagnosis not present

## 2020-11-18 DIAGNOSIS — M7051 Other bursitis of knee, right knee: Secondary | ICD-10-CM | POA: Diagnosis not present

## 2020-11-18 DIAGNOSIS — G20 Parkinson's disease: Secondary | ICD-10-CM | POA: Diagnosis not present

## 2020-11-18 NOTE — Progress Notes (Signed)
[facility] Consult Note Telephone: [phone]  Fax: [phone]    Date of encounter: 11/18/20 PATIENT NAME: Roger Rodriguez [id number]   [phone] (home)  DOB: 14-Aug-1940 MRN: [id number] PRIMARY CARE PROVIDER:    [name]., MD,  [address] [place] [address] [phone]  REFERRING PROVIDER:   Gayland Curry, PA, neurology  RESPONSIBLE PARTY:    Contact Information    Name Relation Home Work Bairoil Z Son [phone]     [place], [place] [phone]         I met face to face with patient in home. Palliative Care was asked to follow this patient by consultation request of  Gayland Curry, [place], neurology to address advance care planning and complex medical decision making. This is a follow up visit.  Caregiver, Helene Kelp, present during visit today                                   ASSESSMENT AND PLAN / RECOMMENDATIONS:   Advance Care Planning/Goals of Care: Goals include to maximize quality of life and symptom management.  CODE STATUS: DNR  Symptom Management/Plan:  Parkinson's: Patient having increased fatigue and freezing of his arms.  He does have appointment with neurology on 11/26/2020.  Encouraged to discuss all the symptoms with his neurologist and will forward this note to his neurologist  Fatigue: Fatigue could be progression of his disease or continued allergy symptoms.  Does have upcoming appointment with his neurologist to discuss this further  Bursitis of right knee: Have encouraged patient to reach out to his orthopedist to set up an appointment to evaluate the recurrent bursitis in his right knee.  States that he does not remember the orthopedist name but will look it up and set up an appointment.  Will reach out to social worker for referral and assistance with medication cost   Follow up Palliative Care Visit: Palliative  care will continue to follow for complex medical decision making, advance care planning, and clarification of goals. Return 8 weeks or prn.  Urged to call with any questions or concerns  I spent 45 minutes providing this consultation. More than 50% of the time in this consultation was spent in counseling and care coordination.   PPS: 30%  HOSPICE ELIGIBILITY/DIAGNOSIS: TBD  Chief Complaint: Follow-up palliative visit  HISTORY OF PRESENT ILLNESS:  Roger Rodriguez is a 80 y.o. year old male  with Parkinson's disease, CKD, OA, HTN, depression.  Patient states he continues to have fatigue and is sleeping more.  States that he continues to have freezing in his right arm and is starting to have more freezing in his left arm.  Patient has been weaned off the Gocovri and is on amantadine 100 mg daily.  Patient does state needing some assistance covering his Rytary.  His Gocovri was weaned off due to grant money about to run out.  Patient states that the pain in his right knee has improved but does have concerns if he needs to have this drained.  States that he has been followed by Ortho in the past.  Patient does state that he has been having some allergy symptoms such as itchy watery eyes, congestion, sneezing.  This could be 1 cause of increased fatigue.  Rest of 10 point ROS asked and negative except what is stated  in HPI  History obtained from review of EMR and interview with caregiver and Mr. Smeltzer.     PHYSICAL EXAM: BP 110/62HR 97 O2 97% on RA General:sitting in wheelchair in NAD Eyes: Sclera anicteric and noninjected with no discharge noted ENMT: Moist mucous membranes Cardiovascular: regular rate and rhythm Pulmonary:lung sounds clear; normal respiratory effort Abdomen: soft, nontender, + bowel sounds BT:[id number] in place withcleardark yellow urinenoted in bag Extremities:trace edema to feet with more to right foot,patient has swelling to right knee with no tenderness  upon palpation Skin:no rashes on exposed skin Neurological:Patient is wheelchair bound; has some forgetfulness  Thank you for the opportunity to participate in the care of Mr. College.  The palliative care team will continue to follow. Please call our office at [phone] if we can be of additional assistance.   Daundre Biel Jenetta Downer, NP , DNP  This chart was dictated using voice recognition software. Despite best efforts to proofread, errors can occur which can change the documentation meaning.   COVID-19 PATIENT SCREENING TOOL Asked and negative response unless otherwise noted:   Have you had symptoms of covid, tested positive or been in contact with someone with symptoms/positive test in the past 5-10 days?  Negative

## 2020-11-18 NOTE — Telephone Encounter (Signed)
Called both the patient's home and cell number and left a message letting him know that the Palliative NP would be coming out today for a f/u visit at 1:30 PM instead of 1 PM.  Left my name and call back number and requested that he call me back if this was not okay with him.

## 2020-11-20 ENCOUNTER — Telehealth: Payer: Self-pay

## 2020-11-20 NOTE — Telephone Encounter (Signed)
11/20/20 @850AM : Palliative care SW outreached patient per [facility] NP A. Olena Heckle, request to assess and provide resources for prescription assistance.  Call completed, however, SW was unable to fully understand patient due to muffled/slurred speech, mostly due to parkinson. More collaboration with NP to inquire on specific needs. SW will research and mail patient prescription assistance resources.

## 2020-11-25 NOTE — Progress Notes (Signed)
Suprapubic Cath Change  Patient is present today for a suprapubic catheter change due to urinary retention.  9 ml of water was drained from the balloon, a 16 FR foley cath was removed from the tract with out difficulty.  Site was cleaned and prepped in a sterile fashion with betadine.  A 16 FR foley cath was replaced into the tract no complications were noted. Urine return was noted, 10 ml of sterile water was inflated into the balloon and a leg bag was attached for drainage.  Patient tolerated well. A night bag was given to patient and proper instruction was given on how to switch bags.    Performed by: Zara Council, PA-C and Kyra Manges, CMA  Follow up: One month for SPT exchange

## 2020-11-26 ENCOUNTER — Other Ambulatory Visit: Payer: Self-pay

## 2020-11-26 ENCOUNTER — Ambulatory Visit: Payer: PPO | Admitting: Urology

## 2020-11-26 DIAGNOSIS — Z9359 Other cystostomy status: Secondary | ICD-10-CM

## 2020-11-26 DIAGNOSIS — G20 Parkinson's disease: Secondary | ICD-10-CM | POA: Diagnosis not present

## 2020-11-26 DIAGNOSIS — F0280 Dementia in other diseases classified elsewhere without behavioral disturbance: Secondary | ICD-10-CM | POA: Diagnosis not present

## 2020-11-26 DIAGNOSIS — G4752 REM sleep behavior disorder: Secondary | ICD-10-CM | POA: Diagnosis not present

## 2020-12-11 DIAGNOSIS — G2581 Restless legs syndrome: Secondary | ICD-10-CM | POA: Diagnosis not present

## 2020-12-11 DIAGNOSIS — M48061 Spinal stenosis, lumbar region without neurogenic claudication: Secondary | ICD-10-CM | POA: Diagnosis not present

## 2020-12-11 DIAGNOSIS — R1310 Dysphagia, unspecified: Secondary | ICD-10-CM | POA: Diagnosis not present

## 2020-12-11 DIAGNOSIS — N529 Male erectile dysfunction, unspecified: Secondary | ICD-10-CM | POA: Diagnosis not present

## 2020-12-11 DIAGNOSIS — G20 Parkinson's disease: Secondary | ICD-10-CM | POA: Diagnosis not present

## 2020-12-11 DIAGNOSIS — F32A Depression, unspecified: Secondary | ICD-10-CM | POA: Diagnosis not present

## 2020-12-11 DIAGNOSIS — E785 Hyperlipidemia, unspecified: Secondary | ICD-10-CM | POA: Diagnosis not present

## 2020-12-11 DIAGNOSIS — I10 Essential (primary) hypertension: Secondary | ICD-10-CM | POA: Diagnosis not present

## 2020-12-11 DIAGNOSIS — M1990 Unspecified osteoarthritis, unspecified site: Secondary | ICD-10-CM | POA: Diagnosis not present

## 2020-12-11 DIAGNOSIS — F419 Anxiety disorder, unspecified: Secondary | ICD-10-CM | POA: Diagnosis not present

## 2020-12-11 DIAGNOSIS — F0280 Dementia in other diseases classified elsewhere without behavioral disturbance: Secondary | ICD-10-CM | POA: Diagnosis not present

## 2020-12-12 ENCOUNTER — Telehealth: Payer: Self-pay | Admitting: Family Medicine

## 2020-12-12 NOTE — Telephone Encounter (Signed)
Please review request. KW 

## 2020-12-12 NOTE — Telephone Encounter (Signed)
Roger Rodriguez PT with [facility] is calling and pt had start of care yesterday and Roger Rodriguez would like verbal PT orders 1x1, 2x2, 1x4 and also nursing evaluation due to blood in urine yesterday

## 2020-12-16 NOTE — Telephone Encounter (Signed)
ok 

## 2020-12-16 NOTE — Telephone Encounter (Signed)
Verbal orders were given. KW

## 2020-12-17 DIAGNOSIS — F419 Anxiety disorder, unspecified: Secondary | ICD-10-CM | POA: Diagnosis not present

## 2020-12-17 DIAGNOSIS — E785 Hyperlipidemia, unspecified: Secondary | ICD-10-CM | POA: Diagnosis not present

## 2020-12-17 DIAGNOSIS — G2581 Restless legs syndrome: Secondary | ICD-10-CM | POA: Diagnosis not present

## 2020-12-17 DIAGNOSIS — G20 Parkinson's disease: Secondary | ICD-10-CM | POA: Diagnosis not present

## 2020-12-17 DIAGNOSIS — R1310 Dysphagia, unspecified: Secondary | ICD-10-CM | POA: Diagnosis not present

## 2020-12-17 DIAGNOSIS — I10 Essential (primary) hypertension: Secondary | ICD-10-CM | POA: Diagnosis not present

## 2020-12-17 DIAGNOSIS — F0280 Dementia in other diseases classified elsewhere without behavioral disturbance: Secondary | ICD-10-CM | POA: Diagnosis not present

## 2020-12-17 DIAGNOSIS — M48061 Spinal stenosis, lumbar region without neurogenic claudication: Secondary | ICD-10-CM | POA: Diagnosis not present

## 2020-12-17 DIAGNOSIS — N529 Male erectile dysfunction, unspecified: Secondary | ICD-10-CM | POA: Diagnosis not present

## 2020-12-17 DIAGNOSIS — F32A Depression, unspecified: Secondary | ICD-10-CM | POA: Diagnosis not present

## 2020-12-17 DIAGNOSIS — M1990 Unspecified osteoarthritis, unspecified site: Secondary | ICD-10-CM | POA: Diagnosis not present

## 2020-12-18 DIAGNOSIS — E785 Hyperlipidemia, unspecified: Secondary | ICD-10-CM | POA: Diagnosis not present

## 2020-12-18 DIAGNOSIS — F419 Anxiety disorder, unspecified: Secondary | ICD-10-CM | POA: Diagnosis not present

## 2020-12-18 DIAGNOSIS — M48061 Spinal stenosis, lumbar region without neurogenic claudication: Secondary | ICD-10-CM | POA: Diagnosis not present

## 2020-12-18 DIAGNOSIS — I10 Essential (primary) hypertension: Secondary | ICD-10-CM | POA: Diagnosis not present

## 2020-12-18 DIAGNOSIS — R1310 Dysphagia, unspecified: Secondary | ICD-10-CM | POA: Diagnosis not present

## 2020-12-18 DIAGNOSIS — G20 Parkinson's disease: Secondary | ICD-10-CM | POA: Diagnosis not present

## 2020-12-18 DIAGNOSIS — F32A Depression, unspecified: Secondary | ICD-10-CM | POA: Diagnosis not present

## 2020-12-18 DIAGNOSIS — N529 Male erectile dysfunction, unspecified: Secondary | ICD-10-CM | POA: Diagnosis not present

## 2020-12-18 DIAGNOSIS — F0280 Dementia in other diseases classified elsewhere without behavioral disturbance: Secondary | ICD-10-CM | POA: Diagnosis not present

## 2020-12-18 DIAGNOSIS — G2581 Restless legs syndrome: Secondary | ICD-10-CM | POA: Diagnosis not present

## 2020-12-18 DIAGNOSIS — M1990 Unspecified osteoarthritis, unspecified site: Secondary | ICD-10-CM | POA: Diagnosis not present

## 2020-12-19 ENCOUNTER — Telehealth: Payer: Self-pay | Admitting: Family Medicine

## 2020-12-19 NOTE — Telephone Encounter (Signed)
Called to get verification on a medication for Carbidopa, which is on the paperwork for patient that states he should be taking.  This medication was not on patient med list and [facility] would like clarification as to whether patient should be taking this medication.  Please advise and call to discuss at [phone]

## 2020-12-20 NOTE — Telephone Encounter (Signed)
Patient has an appt on 12/31/20 w/ [name].

## 2020-12-22 ENCOUNTER — Other Ambulatory Visit: Payer: Self-pay | Admitting: Family Medicine

## 2020-12-22 NOTE — Telephone Encounter (Signed)
Has office visit 12/31/20 Requested Prescriptions  Pending Prescriptions Disp Refills  . celecoxib (CELEBREX) 100 MG capsule [Pharmacy Med Name: CELECOXIB 100 MG CAPSULE] 60 capsule 0    Sig: TAKE 1 CAPSULE BY MOUTH TWICE A DAY     Analgesics:  COX2 Inhibitors Failed - 12/22/2020  4:53 PM      Failed - Cr in normal range and within 360 days    Creatinine  Date Value Ref Range Status  04/06/2014 0.94 0.60 - 1.30 mg/dL Final   Creatinine, Ser  Date Value Ref Range Status  08/15/2018 1.05 0.76 - 1.27 mg/dL Final         Failed - Valid encounter within last 12 months    Recent Outpatient Visits          1 year ago Idiopathic Parkinson's disease [facility])   [facility] [name]., MD   2 years ago Diabetes mellitus without complication [facility])   [facility] [name]., MD   2 years ago Pre-op examination   [facility] [name]., MD   2 years ago Encounter for annual physical exam   [facility] [name]., MD   2 years ago Right knee pain, unspecified chronicity   [facility] [name]., MD      Future Appointments            In 1 week [name]., MD [facility]   In 1 week McGowan, Hunt Oris, PA-C [facility]           Passed - HGB in normal range and within 360 days    Hemoglobin  Date Value Ref Range Status  07/24/2020 14.3 13.0 - 17.0 g/dL Final  08/15/2018 13.5 13.0 - 17.7 g/dL Final         Passed - Patient is not pregnant

## 2020-12-25 ENCOUNTER — Ambulatory Visit: Payer: Self-pay | Admitting: Urology

## 2020-12-26 ENCOUNTER — Telehealth: Payer: Self-pay

## 2020-12-26 DIAGNOSIS — F32A Depression, unspecified: Secondary | ICD-10-CM | POA: Diagnosis not present

## 2020-12-26 DIAGNOSIS — F0280 Dementia in other diseases classified elsewhere without behavioral disturbance: Secondary | ICD-10-CM | POA: Diagnosis not present

## 2020-12-26 DIAGNOSIS — G20 Parkinson's disease: Secondary | ICD-10-CM | POA: Diagnosis not present

## 2020-12-26 DIAGNOSIS — I10 Essential (primary) hypertension: Secondary | ICD-10-CM | POA: Diagnosis not present

## 2020-12-26 DIAGNOSIS — M48061 Spinal stenosis, lumbar region without neurogenic claudication: Secondary | ICD-10-CM | POA: Diagnosis not present

## 2020-12-26 DIAGNOSIS — F419 Anxiety disorder, unspecified: Secondary | ICD-10-CM | POA: Diagnosis not present

## 2020-12-26 DIAGNOSIS — E785 Hyperlipidemia, unspecified: Secondary | ICD-10-CM | POA: Diagnosis not present

## 2020-12-26 DIAGNOSIS — G2581 Restless legs syndrome: Secondary | ICD-10-CM | POA: Diagnosis not present

## 2020-12-26 DIAGNOSIS — R1310 Dysphagia, unspecified: Secondary | ICD-10-CM | POA: Diagnosis not present

## 2020-12-26 DIAGNOSIS — M1990 Unspecified osteoarthritis, unspecified site: Secondary | ICD-10-CM | POA: Diagnosis not present

## 2020-12-26 DIAGNOSIS — N529 Male erectile dysfunction, unspecified: Secondary | ICD-10-CM | POA: Diagnosis not present

## 2020-12-26 NOTE — Telephone Encounter (Signed)
Copied from [place] [id number]. Topic: General - Other >> Dec 26, 2020  1:45 PM Bayard Beaver wrote: Reason for CRM: Sela Hua from [facility] called in to let us know patient missed Physical therapy appt today,because of work. He willl try agin with patient tomorrow he says.

## 2020-12-27 ENCOUNTER — Telehealth: Payer: Self-pay | Admitting: Family Medicine

## 2020-12-27 NOTE — Telephone Encounter (Signed)
Maren Reamer PT with [facility] is calling to let [name] know the patient cancelled his PT appt today and will try next week

## 2020-12-30 DIAGNOSIS — N529 Male erectile dysfunction, unspecified: Secondary | ICD-10-CM | POA: Diagnosis not present

## 2020-12-30 DIAGNOSIS — M1990 Unspecified osteoarthritis, unspecified site: Secondary | ICD-10-CM | POA: Diagnosis not present

## 2020-12-30 DIAGNOSIS — F0280 Dementia in other diseases classified elsewhere without behavioral disturbance: Secondary | ICD-10-CM | POA: Diagnosis not present

## 2020-12-30 DIAGNOSIS — I10 Essential (primary) hypertension: Secondary | ICD-10-CM | POA: Diagnosis not present

## 2020-12-30 DIAGNOSIS — G2581 Restless legs syndrome: Secondary | ICD-10-CM | POA: Diagnosis not present

## 2020-12-30 DIAGNOSIS — F32A Depression, unspecified: Secondary | ICD-10-CM | POA: Diagnosis not present

## 2020-12-30 DIAGNOSIS — F419 Anxiety disorder, unspecified: Secondary | ICD-10-CM | POA: Diagnosis not present

## 2020-12-30 DIAGNOSIS — R1310 Dysphagia, unspecified: Secondary | ICD-10-CM | POA: Diagnosis not present

## 2020-12-30 DIAGNOSIS — E785 Hyperlipidemia, unspecified: Secondary | ICD-10-CM | POA: Diagnosis not present

## 2020-12-30 DIAGNOSIS — M48061 Spinal stenosis, lumbar region without neurogenic claudication: Secondary | ICD-10-CM | POA: Diagnosis not present

## 2020-12-30 DIAGNOSIS — G20 Parkinson's disease: Secondary | ICD-10-CM | POA: Diagnosis not present

## 2020-12-31 ENCOUNTER — Ambulatory Visit (INDEPENDENT_AMBULATORY_CARE_PROVIDER_SITE_OTHER): Payer: PPO | Admitting: Family Medicine

## 2020-12-31 ENCOUNTER — Encounter: Payer: Self-pay | Admitting: Family Medicine

## 2020-12-31 ENCOUNTER — Other Ambulatory Visit: Payer: Self-pay

## 2020-12-31 VITALS — BP 110/72 | HR 90 | Temp 98.5°F

## 2020-12-31 DIAGNOSIS — C61 Malignant neoplasm of prostate: Secondary | ICD-10-CM

## 2020-12-31 DIAGNOSIS — E119 Type 2 diabetes mellitus without complications: Secondary | ICD-10-CM

## 2020-12-31 DIAGNOSIS — I10 Essential (primary) hypertension: Secondary | ICD-10-CM

## 2020-12-31 DIAGNOSIS — E7800 Pure hypercholesterolemia, unspecified: Secondary | ICD-10-CM | POA: Diagnosis not present

## 2020-12-31 DIAGNOSIS — T8450XD Infection and inflammatory reaction due to unspecified internal joint prosthesis, subsequent encounter: Secondary | ICD-10-CM | POA: Diagnosis not present

## 2020-12-31 DIAGNOSIS — G20 Parkinson's disease: Secondary | ICD-10-CM | POA: Diagnosis not present

## 2020-12-31 DIAGNOSIS — F321 Major depressive disorder, single episode, moderate: Secondary | ICD-10-CM | POA: Diagnosis not present

## 2020-12-31 NOTE — Progress Notes (Signed)
Established patient visit   Patient: Roger Rodriguez   DOB: Jul 24, 1940   80 y.o. Male  MRN: [id number] Visit Date: 12/31/2020  Today's healthcare provider: Wilhemena Durie, MD   Chief Complaint  Patient presents with   Hypertension    Subjective    HPI  Patient receiving 24-hour care at home for Parkinson's disease and progressive debility he is able to help feed himself but is not able to bear any weight.  He is able to help with transfers. Has a chronic indwelling suprapubic catheter. He complains of numbness feeling cold in his legs and hands at times. Diabetes Mellitus Type II, follow-up  Lab Results  Component Value Date   HGBA1C 7.3 (H) 06/02/2018   HGBA1C 6.0 04/02/2016   HGBA1C 6.1 (H) 10/02/2015   Last seen for diabetes 2 years ago.  Management since then includes continuing the same treatment. He reports good compliance with treatment. He is not having side effects.   Home blood sugar records: fasting range: 110s  Episodes of hypoglycemia? No    Current insulin regiment: none Most Recent Eye Exam: up to date   Hypertension, follow-up  BP Readings from Last 3 Encounters:  12/31/20 110/72  11/18/20 118/72  09/19/20 122/77   Wt Readings from Last 3 Encounters:  04/11/20 185 lb (83.9 kg)  03/11/20 170 lb (77.1 kg)  12/27/19 170 lb (77.1 kg)     He was last seen for hypertension 2 years ago.  BP at that visit was 149/89. Management since that visit includes no medication changes. He reports good compliance with treatment. He is not having side effects.  He is not exercising. He is adherent to low salt diet.   Outside blood pressures are checked occasionally.  He does not smoke.  Use of agents associated with hypertension: none.   --------------------------------------------------------------------------------------------------- Lipid/Cholesterol, follow-up  Last Lipid Panel: Lab Results  Component Value Date   CHOL 223 (H)  08/15/2018   LDLCALC 138 (H) 08/15/2018   HDL 33 (L) 08/15/2018   TRIG 258 (H) 08/15/2018    He was last seen for this 2 years ago.  Management since that visit includes no medication changes.  He reports good compliance with treatment. He is not having side effects.   Symptoms: No appetite changes No foot ulcerations  No chest pain No chest pressure/discomfort  No dyspnea No orthopnea  No fatigue No lower extremity edema  No palpitations No paroxysmal nocturnal dyspnea  No nausea No numbness or tingling of extremity  No polydipsia No polyuria  No speech difficulty No syncope   He is following a Regular diet. Current exercise: none  Last metabolic panel Lab Results  Component Value Date   GLUCOSE 168 (H) 08/15/2018   NA 135 08/15/2018   K 4.6 08/15/2018   BUN 17 08/15/2018   CREATININE 1.05 08/15/2018   GFRNONAA 68 08/15/2018   GFRAA 78 08/15/2018   CALCIUM 9.5 08/15/2018   AST 21 08/15/2018   ALT 12 08/15/2018   The ASCVD Risk score Mikey Bussing DC Jr., et al., 2013) failed to calculate for the following reasons:   The 2013 ASCVD risk score is only valid for ages 20 to 5  Depression, Follow-up  He  was last seen for this 2 years ago. Changes made at last visit include no medication changes.   He reports good compliance with treatment. He is not having side effects.   He reports good tolerance of treatment.  Depression screen PHQ  2/9 11/15/2019 09/01/2018 07/25/2018  Decreased Interest 0 1 1  Down, Depressed, Hopeless 1 1 0  PHQ - 2 Score 1 2 1   Altered sleeping - 1 -  Tired, decreased energy - 1 -  Change in appetite - 0 -  Feeling bad or failure about yourself  - 0 -  Trouble concentrating - 1 -  Moving slowly or fidgety/restless - 0 -  Suicidal thoughts - 0 -  PHQ-9 Score - 5 -  Difficult doing work/chores - Somewhat difficult -  Some recent data might be hidden         Medications: Outpatient Medications Prior to Visit  Medication Sig   budesonide  (RHINOCORT AQUA) 32 MCG/ACT nasal spray USE 1 PUFF IN EACH NOSTRIL EVERY DAY AS NEEDED (Patient taking differently: Place 1 spray into both nostrils daily as needed for allergies.)   busPIRone (BUSPAR) 7.5 MG tablet Take 7.5 mg by mouth 2 (two) times daily. To 3 times daily   celecoxib (CELEBREX) 100 MG capsule TAKE 1 CAPSULE BY MOUTH TWICE A DAY   cholecalciferol (VITAMIN D) 1000 units tablet Take 1,000 Units by mouth daily.    enalapril (VASOTEC) 2.5 MG tablet Take 1 tablet (2.5 mg total) by mouth 2 (two) times daily.   gabapentin (NEURONTIN) 100 MG capsule Take 200 mg by mouth 3 (three) times daily.   glucose blood (QUINTET AC BLOOD GLUCOSE TEST) test strip Use as instructed   GOCOVRI 137 MG CP24 Take by mouth.   Hypromellose (ARTIFICIAL TEARS OP) Place 1 drop into both eyes 4 (four) times daily as needed (for dry eyes).    insulin aspart (NOVOLOG) 100 UNIT/ML injection Inject 0-15 Units into the skin 3 (three) times daily with meals.   loratadine (CLARITIN) 10 MG tablet Take 10 mg by mouth daily.   metFORMIN (GLUCOPHAGE) 500 MG tablet TAKE 1 TABLET (500 MG TOTAL) BY MOUTH 2 (TWO) TIMES DAILY WITH A MEAL.   mirabegron ER (MYRBETRIQ) 25 MG TB24 tablet Take 1 tablet (25 mg total) by mouth daily.   Multiple Vitamin (MULTIVITAMIN WITH MINERALS) TABS tablet Take 1 tablet by mouth daily.   NOURIANZ 20 MG TABS Take 1 tablet by mouth at bedtime.   polyethylene glycol (MIRALAX / GLYCOLAX) packet Take 17 g by mouth daily as needed.    pramipexole (MIRAPEX) 0.5 MG tablet Take 2 tablets (1 mg total) by mouth at bedtime. (Patient taking differently: Take 0.25 mg by mouth 3 (three) times daily.)   QUEtiapine (SEROQUEL) 25 MG tablet Take 25 mg by mouth at bedtime.   RYTARY 61.25-245 MG CPCR Take 1 capsule by mouth 4 (four) times daily.   sertraline (ZOLOFT) 25 MG tablet Take 25 mg by mouth daily.   vitamin B-12 (CYANOCOBALAMIN) 1000 MCG tablet Take 1,000 mcg by mouth daily.   vitamin C (ASCORBIC ACID) 500  MG tablet Take 1,000 mg by mouth daily.    vitamin E 180 MG (400 UNITS) capsule Take 400 Units by mouth daily.    No facility-administered medications prior to visit.    Review of Systems  Constitutional:  Negative for activity change and fatigue.  Respiratory:  Negative for cough and shortness of breath.   Cardiovascular:  Negative for chest pain, palpitations and leg swelling.  Endocrine: Negative for cold intolerance, heat intolerance, polydipsia, polyphagia and polyuria.  Musculoskeletal:  Negative for arthralgias and myalgias.  Neurological:  Negative for dizziness, light-headedness and headaches.  Psychiatric/Behavioral:  Negative for self-injury, sleep disturbance and suicidal ideas. The  patient is not nervous/anxious and is not hyperactive.        Objective    BP 110/72   Pulse 90   Temp 98.5 F (36.9 C)      Physical Exam Vitals reviewed.  Constitutional:      Appearance: He is well-developed.     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  HENT:     Head: Normocephalic and atraumatic.     Right Ear: External ear normal.     Left Ear: External ear normal.     Nose: Nose normal.  Eyes:     General: No scleral icterus.    Conjunctiva/sclera: Conjunctivae normal.  Neck:     Thyroid: No thyromegaly.  Cardiovascular:     Rate and Rhythm: Normal rate and regular rhythm.     Heart sounds: Normal heart sounds.  Pulmonary:     Effort: Pulmonary effort is normal.     Breath sounds: Normal breath sounds.  Abdominal:     Palpations: Abdomen is soft.  Skin:    General: Skin is warm and dry.     Comments: Very fair skin.  Neurological:     Mental Status: He is alert and oriented to person, place, and time. Mental status is at baseline.     Motor: Abnormal muscle tone present.     Comments: Parkinsons stigmata.  Psychiatric:        Mood and Affect: Mood normal.        Behavior: Behavior normal.        Thought Content: Thought content  normal.        Judgment: Judgment normal.      No results found for any visits on 12/31/20.  Assessment & Plan     1. Idiopathic Parkinson's disease ([place]) On Sinemet to help with PD  2. Essential (primary) hypertension Good control. - CBC with Differential/Platelet - Comprehensive metabolic panel  3. Diabetes mellitus without complication ([place]) Follow-up A1c with goal less than 8. - Hemoglobin A1c  4. Pure hypercholesterolemia  - Lipid panel - TSH  5. Infection of prosthetic joint, subsequent encounter Patient unable to bear weight on right knee.  6. Depression, major, single episode, moderate (HCC) On sertraline and Seroquel  7. Malignant neoplasm of prostate ([place]) Cancer followed.  No further work-up   No follow-ups on file.      I, Wilhemena Durie, MD, have reviewed all documentation for this visit. The documentation on 01/05/21 for the exam, diagnosis, procedures, and orders are all accurate and complete.    Agueda Houpt Cranford Mon, MD  [facility] [phone] (phone) [phone] (fax)  [place]

## 2020-12-31 NOTE — Patient Instructions (Signed)
Stop Celebrex

## 2020-12-31 NOTE — Progress Notes (Signed)
Suprapubic Cath Change  Patient is present today for a suprapubic catheter change due to urinary retention.  9 ml of water was drained from the balloon, a 16 FR foley cath was removed from the tract with out difficulty.  Site was cleaned and prepped in a sterile fashion with betadine.  A 16 FR foley cath was replaced into the tract no complications were noted. Urine return was noted, 10 ml of sterile water was inflated into the balloon and a leg bag was attached for drainage.  Patient tolerated well. A night bag was given to patient and proper instruction was given on how to switch bags.    Performed by: Zara Council, PA-C and Crysta S. Albright, CMA  Follow up: One month for SPT exchange

## 2021-01-01 ENCOUNTER — Ambulatory Visit: Payer: PPO | Admitting: Urology

## 2021-01-01 ENCOUNTER — Other Ambulatory Visit: Payer: Self-pay

## 2021-01-01 DIAGNOSIS — Z9359 Other cystostomy status: Secondary | ICD-10-CM

## 2021-01-01 LAB — COMPREHENSIVE METABOLIC PANEL
ALT: 5 IU/L (ref 0–44)
AST: 11 IU/L (ref 0–40)
Albumin/Globulin Ratio: 1.3 (ref 1.2–2.2)
Albumin: 4.3 g/dL (ref 3.7–4.7)
Alkaline Phosphatase: 72 IU/L (ref 44–121)
BUN/Creatinine Ratio: 21 (ref 10–24)
BUN: 18 mg/dL (ref 8–27)
Bilirubin Total: 0.6 mg/dL (ref 0.0–1.2)
CO2: 23 mmol/L (ref 20–29)
Calcium: 9.2 mg/dL (ref 8.6–10.2)
Chloride: 96 mmol/L (ref 96–106)
Creatinine, Ser: 0.85 mg/dL (ref 0.76–1.27)
Globulin, Total: 3.2 g/dL (ref 1.5–4.5)
Glucose: 116 mg/dL — ABNORMAL HIGH (ref 65–99)
Potassium: 4.5 mmol/L (ref 3.5–5.2)
Sodium: 135 mmol/L (ref 134–144)
Total Protein: 7.5 g/dL (ref 6.0–8.5)
eGFR: 88 mL/min/{1.73_m2} (ref >59)

## 2021-01-01 LAB — LIPID PANEL
Chol/HDL Ratio: 7.3 ratio — ABNORMAL HIGH (ref 0.0–5.0)
Cholesterol, Total: 225 mg/dL — ABNORMAL HIGH (ref 100–199)
HDL: 31 mg/dL — ABNORMAL LOW (ref >39)
LDL Chol Calc (NIH): 156 mg/dL — ABNORMAL HIGH (ref 0–99)
Triglycerides: 204 mg/dL — ABNORMAL HIGH (ref 0–149)
VLDL Cholesterol Cal: 38 mg/dL (ref 5–40)

## 2021-01-01 LAB — CBC WITH DIFFERENTIAL/PLATELET
Basophils Absolute: 0 10*3/uL (ref 0.0–0.2)
Basos: 1 %
EOS (ABSOLUTE): 0.1 10*3/uL (ref 0.0–0.4)
Eos: 2 %
Hematocrit: 42.5 % (ref 37.5–51.0)
Hemoglobin: 14.8 g/dL (ref 13.0–17.7)
Immature Grans (Abs): 0 10*3/uL (ref 0.0–0.1)
Immature Granulocytes: 0 %
Lymphocytes Absolute: 1.1 10*3/uL (ref 0.7–3.1)
Lymphs: 15 %
MCH: 31.3 pg (ref 26.6–33.0)
MCHC: 34.8 g/dL (ref 31.5–35.7)
MCV: 90 fL (ref 79–97)
Monocytes Absolute: 0.8 10*3/uL (ref 0.1–0.9)
Monocytes: 11 %
Neutrophils Absolute: 5.1 10*3/uL (ref 1.4–7.0)
Neutrophils: 71 %
Platelets: 240 10*3/uL (ref 150–450)
RBC: 4.73 x10E6/uL (ref 4.14–5.80)
RDW: 12.5 % (ref 11.6–15.4)
WBC: 7.2 10*3/uL (ref 3.4–10.8)

## 2021-01-01 LAB — HEMOGLOBIN A1C
Est. average glucose Bld gHb Est-mCnc: 166 mg/dL
Hgb A1c MFr Bld: 7.4 % — ABNORMAL HIGH (ref 4.8–5.6)

## 2021-01-01 LAB — TSH: TSH: 1.97 u[IU]/mL (ref 0.450–4.500)

## 2021-01-02 DIAGNOSIS — M48061 Spinal stenosis, lumbar region without neurogenic claudication: Secondary | ICD-10-CM | POA: Diagnosis not present

## 2021-01-02 DIAGNOSIS — I10 Essential (primary) hypertension: Secondary | ICD-10-CM | POA: Diagnosis not present

## 2021-01-02 DIAGNOSIS — R1310 Dysphagia, unspecified: Secondary | ICD-10-CM | POA: Diagnosis not present

## 2021-01-02 DIAGNOSIS — F0280 Dementia in other diseases classified elsewhere without behavioral disturbance: Secondary | ICD-10-CM | POA: Diagnosis not present

## 2021-01-02 DIAGNOSIS — M1990 Unspecified osteoarthritis, unspecified site: Secondary | ICD-10-CM | POA: Diagnosis not present

## 2021-01-02 DIAGNOSIS — F32A Depression, unspecified: Secondary | ICD-10-CM | POA: Diagnosis not present

## 2021-01-02 DIAGNOSIS — G20 Parkinson's disease: Secondary | ICD-10-CM | POA: Diagnosis not present

## 2021-01-02 DIAGNOSIS — F419 Anxiety disorder, unspecified: Secondary | ICD-10-CM | POA: Diagnosis not present

## 2021-01-02 DIAGNOSIS — N529 Male erectile dysfunction, unspecified: Secondary | ICD-10-CM | POA: Diagnosis not present

## 2021-01-02 DIAGNOSIS — G2581 Restless legs syndrome: Secondary | ICD-10-CM | POA: Diagnosis not present

## 2021-01-02 DIAGNOSIS — E785 Hyperlipidemia, unspecified: Secondary | ICD-10-CM | POA: Diagnosis not present

## 2021-01-08 ENCOUNTER — Telehealth: Payer: Self-pay

## 2021-01-08 NOTE — Telephone Encounter (Signed)
Copied from [place] [id number]. Topic: General - Other >> Jan 08, 2021  3:01 PM Yvette Rack wrote: Reason for CRM: Di Kindle with [facility] called for an update on the physician's order that was sent last week (12/31/20). Cb# [phone]

## 2021-01-14 ENCOUNTER — Telehealth: Payer: Self-pay

## 2021-01-14 NOTE — Telephone Encounter (Signed)
Please advise. Ok to write?

## 2021-01-14 NOTE — Telephone Encounter (Signed)
Form is in stack for review. Thanks!

## 2021-01-14 NOTE — Telephone Encounter (Signed)
Copied from [place] [phone]. Topic: General - Other >> Jan 14, 2021 12:42 PM Pawlus, Brayton Layman A wrote: Reason for CRM: Pt wanted to know if [name] would write an order for a bed pan, Pt would like it sent to [facility] in [place].

## 2021-01-15 DIAGNOSIS — G20 Parkinson's disease: Secondary | ICD-10-CM | POA: Diagnosis not present

## 2021-01-15 DIAGNOSIS — E785 Hyperlipidemia, unspecified: Secondary | ICD-10-CM | POA: Diagnosis not present

## 2021-01-15 DIAGNOSIS — M1990 Unspecified osteoarthritis, unspecified site: Secondary | ICD-10-CM | POA: Diagnosis not present

## 2021-01-15 DIAGNOSIS — I10 Essential (primary) hypertension: Secondary | ICD-10-CM | POA: Diagnosis not present

## 2021-01-15 DIAGNOSIS — F32A Depression, unspecified: Secondary | ICD-10-CM | POA: Diagnosis not present

## 2021-01-15 DIAGNOSIS — F419 Anxiety disorder, unspecified: Secondary | ICD-10-CM | POA: Diagnosis not present

## 2021-01-15 DIAGNOSIS — M48061 Spinal stenosis, lumbar region without neurogenic claudication: Secondary | ICD-10-CM | POA: Diagnosis not present

## 2021-01-15 DIAGNOSIS — G2581 Restless legs syndrome: Secondary | ICD-10-CM | POA: Diagnosis not present

## 2021-01-15 DIAGNOSIS — R1310 Dysphagia, unspecified: Secondary | ICD-10-CM | POA: Diagnosis not present

## 2021-01-15 DIAGNOSIS — N529 Male erectile dysfunction, unspecified: Secondary | ICD-10-CM | POA: Diagnosis not present

## 2021-01-15 DIAGNOSIS — F0280 Dementia in other diseases classified elsewhere without behavioral disturbance: Secondary | ICD-10-CM | POA: Diagnosis not present

## 2021-01-16 DIAGNOSIS — E785 Hyperlipidemia, unspecified: Secondary | ICD-10-CM | POA: Diagnosis not present

## 2021-01-16 DIAGNOSIS — M1990 Unspecified osteoarthritis, unspecified site: Secondary | ICD-10-CM | POA: Diagnosis not present

## 2021-01-16 DIAGNOSIS — G2581 Restless legs syndrome: Secondary | ICD-10-CM | POA: Diagnosis not present

## 2021-01-16 DIAGNOSIS — R1310 Dysphagia, unspecified: Secondary | ICD-10-CM | POA: Diagnosis not present

## 2021-01-16 DIAGNOSIS — I10 Essential (primary) hypertension: Secondary | ICD-10-CM | POA: Diagnosis not present

## 2021-01-16 DIAGNOSIS — M48061 Spinal stenosis, lumbar region without neurogenic claudication: Secondary | ICD-10-CM | POA: Diagnosis not present

## 2021-01-16 DIAGNOSIS — G20 Parkinson's disease: Secondary | ICD-10-CM | POA: Diagnosis not present

## 2021-01-16 DIAGNOSIS — F419 Anxiety disorder, unspecified: Secondary | ICD-10-CM | POA: Diagnosis not present

## 2021-01-16 DIAGNOSIS — F32A Depression, unspecified: Secondary | ICD-10-CM | POA: Diagnosis not present

## 2021-01-16 DIAGNOSIS — N529 Male erectile dysfunction, unspecified: Secondary | ICD-10-CM | POA: Diagnosis not present

## 2021-01-16 DIAGNOSIS — F0280 Dementia in other diseases classified elsewhere without behavioral disturbance: Secondary | ICD-10-CM | POA: Diagnosis not present

## 2021-01-18 ENCOUNTER — Other Ambulatory Visit: Payer: Self-pay | Admitting: Family Medicine

## 2021-01-18 NOTE — Telephone Encounter (Signed)
Approved per protocol.  Requested Prescriptions  Pending Prescriptions Disp Refills  . celecoxib (CELEBREX) 100 MG capsule [Pharmacy Med Name: CELECOXIB 100 MG CAPSULE] 60 capsule 0    Sig: TAKE 1 CAPSULE BY MOUTH TWICE A DAY     Analgesics:  COX2 Inhibitors Passed - 01/18/2021  9:26 AM      Passed - HGB in normal range and within 360 days    Hemoglobin  Date Value Ref Range Status  12/31/2020 14.8 13.0 - 17.7 g/dL Final         Passed - Cr in normal range and within 360 days    Creatinine  Date Value Ref Range Status  04/06/2014 0.94 0.60 - 1.30 mg/dL Final   Creatinine, Ser  Date Value Ref Range Status  12/31/2020 0.85 0.76 - 1.27 mg/dL Final         Passed - Patient is not pregnant      Passed - Valid encounter within last 12 months    Recent Outpatient Visits          2 weeks ago Idiopathic Parkinson's disease [facility])   [facility] [name]., MD   1 year ago Idiopathic Parkinson's disease [facility])   [facility] [name]., MD   2 years ago Diabetes mellitus without complication [facility])   [facility] [name]., MD   2 years ago Pre-op examination   [facility] [name]., MD   2 years ago Encounter for annual physical exam   [facility] [name]., MD      Future Appointments            In 1 week Debroah Loop, PA-C [place]   In 4 months Rosanna Randy, Retia Passe., MD [facility] [place]

## 2021-01-20 ENCOUNTER — Other Ambulatory Visit: Payer: Self-pay

## 2021-01-20 ENCOUNTER — Telehealth: Payer: Self-pay

## 2021-01-20 ENCOUNTER — Other Ambulatory Visit: Payer: PPO | Admitting: Student

## 2021-01-20 DIAGNOSIS — Z9359 Other cystostomy status: Secondary | ICD-10-CM | POA: Diagnosis not present

## 2021-01-20 DIAGNOSIS — R52 Pain, unspecified: Secondary | ICD-10-CM

## 2021-01-20 DIAGNOSIS — Z515 Encounter for palliative care: Secondary | ICD-10-CM

## 2021-01-20 DIAGNOSIS — G20 Parkinson's disease: Secondary | ICD-10-CM | POA: Diagnosis not present

## 2021-01-20 NOTE — Chronic Care Management (AMB) (Signed)
    Chronic Care Management Pharmacy Assistant   Name: Roger Rodriguez  MRN: [id number] DOB: 30-Apr-1941   Reason for Encounter: Patient Assistance Coordination   01/20/2021- Patient assistance application filled out for Rytary with [facility] Patient Assistance program. Application mailed to patient with instructions to return to PCP office for [name] to sign and Cristie Hem to fax.   Medications: Outpatient Encounter Medications as of 01/20/2021  Medication Sig   budesonide (RHINOCORT AQUA) 32 MCG/ACT nasal spray USE 1 PUFF IN EACH NOSTRIL EVERY DAY AS NEEDED (Patient taking differently: Place 1 spray into both nostrils daily as needed for allergies.)   busPIRone (BUSPAR) 7.5 MG tablet Take 7.5 mg by mouth 2 (two) times daily. To 3 times daily (Patient not taking: Reported on 01/20/2021)   celecoxib (CELEBREX) 100 MG capsule TAKE 1 CAPSULE BY MOUTH TWICE A DAY   cholecalciferol (VITAMIN D) 1000 units tablet Take 1,000 Units by mouth daily.    enalapril (VASOTEC) 2.5 MG tablet Take 1 tablet (2.5 mg total) by mouth 2 (two) times daily.   gabapentin (NEURONTIN) 100 MG capsule Take 200 mg by mouth 3 (three) times daily.   glucose blood (QUINTET AC BLOOD GLUCOSE TEST) test strip Use as instructed   GOCOVRI 137 MG CP24 Take by mouth.   Hypromellose (ARTIFICIAL TEARS OP) Place 1 drop into both eyes 4 (four) times daily as needed (for dry eyes).    insulin aspart (NOVOLOG) 100 UNIT/ML injection Inject 0-15 Units into the skin 3 (three) times daily with meals.   loratadine (CLARITIN) 10 MG tablet Take 10 mg by mouth daily.   metFORMIN (GLUCOPHAGE) 500 MG tablet TAKE 1 TABLET (500 MG TOTAL) BY MOUTH 2 (TWO) TIMES DAILY WITH A MEAL.   mirabegron ER (MYRBETRIQ) 25 MG TB24 tablet Take 1 tablet (25 mg total) by mouth daily. (Patient not taking: Reported on 01/20/2021)   Multiple Vitamin (MULTIVITAMIN WITH MINERALS) TABS tablet Take 1 tablet by mouth daily.   NOURIANZ 20 MG TABS Take 1 tablet by mouth at bedtime.  (Patient not taking: Reported on 01/20/2021)   polyethylene glycol (MIRALAX / GLYCOLAX) packet Take 17 g by mouth daily as needed.    pramipexole (MIRAPEX) 0.5 MG tablet Take 2 tablets (1 mg total) by mouth at bedtime. (Patient not taking: Reported on 01/20/2021)   QUEtiapine (SEROQUEL) 25 MG tablet Take 25 mg by mouth at bedtime.   RYTARY 61.25-245 MG CPCR Take 1 capsule by mouth 4 (four) times daily.   sertraline (ZOLOFT) 25 MG tablet Take 25 mg by mouth daily. (Patient not taking: Reported on 01/20/2021)   vitamin B-12 (CYANOCOBALAMIN) 1000 MCG tablet Take 1,000 mcg by mouth daily.   vitamin C (ASCORBIC ACID) 500 MG tablet Take 1,000 mg by mouth daily.    vitamin E 180 MG (400 UNITS) capsule Take 400 Units by mouth daily.    No facility-administered encounter medications on file as of 01/20/2021.   Care Gaps: Annual Wellness Visit- None Zoster Vaccines- Shingrix TETANUS/TDAP (Every 10 Years)- Last completed: Nov 30, 2006   COVID-19 Vaccine (3 - Pfizer risk series)- Last completed: Aug 27, 2019  Star Rating Drugs: Enalapril Maleate 2.5 mg last filled 11/13/2020 for [facility] day supply [facility] Metformin 500 mg- last filled 01/27/2021 for 90 days supply at [place]: Pattricia Boss, [place] Pharmacist Assistant [phone]

## 2021-01-20 NOTE — Progress Notes (Signed)
[facility] Consult Note Telephone: [phone]  Fax: [phone]    Date of encounter: 01/20/21 PATIENT NAME: Roger Rodriguez [place] [id number]   [phone] (home)  DOB: [birth year] MRN: [id number] PRIMARY CARE PROVIDER:    [name]., MD,  [address] [place] [address] [phone]  REFERRING PROVIDER:   [name]., MD [address] [place],  [place] [address] [phone]  RESPONSIBLE PARTY:    Contact Information     Name Relation Home Work [place] Son [phone]     [place], [place] [phone]          I met face to face with patient and caregiver iTeresa in the home. Palliative Care was asked to follow this patient by consultation request of  [name].,* to address advance care planning and complex medical decision making. This is a follow up visit.                                   ASSESSMENT AND PLAN / RECOMMENDATIONS:   Advance Care Planning/Goals of Care: Goals include to maximize quality of life and symptom management. Our advance care planning conversation included a discussion about:    The value and importance of advance care planning  Experiences with loved ones who have been seriously ill or have died  Exploration of personal, cultural or spiritual beliefs that might influence medical decisions  Exploration of goals of care in the event of a sudden injury or illness  CODE STATUS: DNR  Symptom Management/Plan:  Parkinson's Disease-patient endorses worsening fatigue, weakness and "freezing" of his arms, voice weakening. Continue Rytary as directed; restarted last week. Continue PT/OT as directed. ST as directed; to trial microphone for voice. Follow up with Neurologist as scheduled.  Pain-OA, patient reports chronic right knee pain, back pain. He is taking Celebrex once a day, would like  it increased back to BID as he feels this was helpful. Discussed alternatives such as acetaminophen and Lidoderm patch. He states acetaminophen caused constipation.  Suprapubic catheter-patient and caregiver request anchor to be supplied due to occasional pulling of catheter. Follow up with Urology as scheduled. Catheter changed monthly per Urology.     Follow up Palliative Care Visit: Palliative care will continue to follow for complex medical decision making, advance care planning, and clarification of goals. Return in 6 weeks or prn.  I spent 60 minutes providing this consultation. More than 50% of the time in this consultation was spent in counseling and care coordination.   PPS: 30%  HOSPICE ELIGIBILITY/DIAGNOSIS: TBD  Chief Complaint: Palliative Medicine follow up visit.   HISTORY OF PRESENT ILLNESS:  Roger Rodriguez is a 80 y.o. year old male  with Parkinson's disease, OA, CKD, hypertension, depression.   Patient resides at home with in home caregiver 7 days a week. He states his voice is getting weaker. He endorses fatigue, weakness and freezing of his arms. He is currently receiving [facility], PT/OT/ST services. He states his Rytary was approved and restarted it last week. Sleeping well at night; usually 6-8 hours and still feels tired. Endorses a good appetite; denies swallowing difficulties except when he takes in too much in too fast. Daily blood sugars usually are low 110's to 120's. FSBS was 122 mg/dL today. Denies any hypoglycemic episodes. Regular bowel  movements; usually every other day. Patient has hoyer lift in home; not currently using. Caregiver states patient is supposed to have a sit to stand lift ordered/delivered soon. Patient with suprapubic catheter. Roger Rodriguez states patient needs an anchor as it occasionally gets pulled. Patient states his mood has been stable; denies depression. He states he stopped antidepressant some time ago.  History obtained from review of  EMR, discussion with primary team, and interview with family, facility staff/caregiver and/or Roger Rodriguez.  I reviewed available labs, medications, imaging, studies and related documents from the EMR.  Records reviewed and summarized above.   ROS  General: NAD EYES: denies vision changes ENMT: denies dysphagia Cardiovascular: denies chest pain, denies DOE Pulmonary:  cough, denies increased SOB Abdomen: endorses good appetite, denies constipation GU: suprapubic catheter MSK:  weakness,  no falls reported Skin: denies rashes or wounds Neurological: denies pain, denies insomnia Psych: Endorses stable mood, denies depression Heme/lymph/immuno: denies bruises, abnormal bleeding  Physical Exam:  Pulse 92 resp 20, 138/68, sats 95% on room air Constitutional: NAD General: frail appearing  EYES: anicteric sclera, lids intact, no discharge  ENMT: intact hearing, oral mucous membranes moist, dentition intact CV: S1S2, RRR, trace LE edema Pulmonary: LCTA, no increased work of breathing, no cough Abdomen: intake 100%, normo-active BS + 4 quadrants, soft and non tender GU: drainage bag with clear, yellow urine present MSK: moves all extremities, non- ambulatory Skin: warm and dry, no rashes or wounds on visible skin Neuro:  generalized weakness, A & O x 3 Psych: non-anxious affect, flat affect Hem/lymph/immuno: no widespread bruising   Thank you for the opportunity to participate in the care of Roger Rodriguez.  The palliative care team will continue to follow. Please call our office at [phone] if we can be of additional assistance.   [name], NP   COVID-19 PATIENT SCREENING TOOL Asked and negative response unless otherwise noted:   Have you had symptoms of covid, tested positive or been in contact with someone with symptoms/positive test in the past 5-10 days? No

## 2021-01-20 NOTE — Telephone Encounter (Signed)
At request of Palliative NP, message sent to urology providers to update on recent visit with patient including request for an anchor to hold suprapubic tube in place as well as some sterile dressings.

## 2021-01-21 ENCOUNTER — Telehealth: Payer: Self-pay

## 2021-01-21 NOTE — Telephone Encounter (Signed)
Copied from [place] [phone]. Topic: General - Other >> Jan 21, 2021 10:18 AM Tessa Lerner A wrote: Reason for CRM: Roger Rodriguez with [facility] has called regarding patient's celecoxib (CELEBREX) 100 MG capsule  prescription  The patient continues to describe consistent discomfort and is interested in continuing to take the medication twice a day instead of once daily  Please contact to further advise when possible

## 2021-01-24 ENCOUNTER — Telehealth: Payer: Self-pay | Admitting: Family Medicine

## 2021-01-24 ENCOUNTER — Telehealth: Payer: Self-pay | Admitting: *Deleted

## 2021-01-24 NOTE — Telephone Encounter (Signed)
Returned call to [place]. Callie stated he needs a length of time for the order.

## 2021-01-24 NOTE — Telephone Encounter (Signed)
Please advise 

## 2021-01-24 NOTE — Telephone Encounter (Signed)
Copied from [place] [phone]. Topic: General - Other >> Jan 23, 2021  4:09 PM Celene Kras wrote: Reason for CRM: Pts son called on behalf of pt stating that his PT, Speech Therapy, and OT is coming to an end. He states that this is proving beneficial for pt and is requesting to extend services. He states that he has spoken to insurance and case manager and they have given the go ahead for requesting orders. Please advise.

## 2021-01-24 NOTE — Telephone Encounter (Signed)
[facility] advised. They need to know a length of time for treatment?

## 2021-01-24 NOTE — Telephone Encounter (Signed)
Home Health Verbal Orders - Caller/Agency: [place], [place]  Callback Number: [phone] Requesting OT/PT/Skilled Nursing/Social Work/Speech Therapy: Missed PT visit  Frequency: Pt cancelled PT today, said that his leg was hurting and that he has a headache

## 2021-01-27 ENCOUNTER — Other Ambulatory Visit: Payer: Self-pay | Admitting: Family Medicine

## 2021-01-27 DIAGNOSIS — M48061 Spinal stenosis, lumbar region without neurogenic claudication: Secondary | ICD-10-CM | POA: Diagnosis not present

## 2021-01-27 DIAGNOSIS — N529 Male erectile dysfunction, unspecified: Secondary | ICD-10-CM | POA: Diagnosis not present

## 2021-01-27 DIAGNOSIS — E119 Type 2 diabetes mellitus without complications: Secondary | ICD-10-CM

## 2021-01-27 DIAGNOSIS — F419 Anxiety disorder, unspecified: Secondary | ICD-10-CM | POA: Diagnosis not present

## 2021-01-27 DIAGNOSIS — F0280 Dementia in other diseases classified elsewhere without behavioral disturbance: Secondary | ICD-10-CM | POA: Diagnosis not present

## 2021-01-27 DIAGNOSIS — I10 Essential (primary) hypertension: Secondary | ICD-10-CM | POA: Diagnosis not present

## 2021-01-27 DIAGNOSIS — R1310 Dysphagia, unspecified: Secondary | ICD-10-CM | POA: Diagnosis not present

## 2021-01-27 DIAGNOSIS — G20 Parkinson's disease: Secondary | ICD-10-CM | POA: Diagnosis not present

## 2021-01-27 DIAGNOSIS — M1990 Unspecified osteoarthritis, unspecified site: Secondary | ICD-10-CM | POA: Diagnosis not present

## 2021-01-27 DIAGNOSIS — F32A Depression, unspecified: Secondary | ICD-10-CM | POA: Diagnosis not present

## 2021-01-27 DIAGNOSIS — G2581 Restless legs syndrome: Secondary | ICD-10-CM | POA: Diagnosis not present

## 2021-01-27 DIAGNOSIS — E785 Hyperlipidemia, unspecified: Secondary | ICD-10-CM | POA: Diagnosis not present

## 2021-01-27 NOTE — Telephone Encounter (Signed)
Requested Prescriptions  Pending Prescriptions Disp Refills  . metFORMIN (GLUCOPHAGE) 500 MG tablet [Pharmacy Med Name: METFORMIN HCL 500 MG TABLET] 180 tablet 1    Sig: TAKE 1 TABLET BY MOUTH 2 TIMES DAILY WITH A MEAL.     Endocrinology:  Diabetes - Biguanides Passed - 01/27/2021  5:02 PM      Passed - Cr in normal range and within 360 days    Creatinine  Date Value Ref Range Status  04/06/2014 0.94 0.60 - 1.30 mg/dL Final   Creatinine, Ser  Date Value Ref Range Status  12/31/2020 0.85 0.76 - 1.27 mg/dL Final         Passed - HBA1C is between 0 and 7.9 and within 180 days    Hgb A1c MFr Bld  Date Value Ref Range Status  12/31/2020 7.4 (H) 4.8 - 5.6 % Final    Comment:             Prediabetes: 5.7 - 6.4          Diabetes: >6.4          Glycemic control for adults with diabetes: <7.0          Passed - eGFR in normal range and within 360 days    EGFR (African American)  Date Value Ref Range Status  04/06/2014 >60 >73m/min Final  04/04/2014 >60  Final   GFR calc Af Amer  Date Value Ref Range Status  08/15/2018 78 >59 mL/min/1.73 Final   EGFR (Non-African Amer.)  Date Value Ref Range Status  04/06/2014 >60 >625mmin Final    Comment:    eGFR values <6066min/1.73 m2 may be an indication of chronic kidney disease (CKD). Calculated eGFR, using the MRDR Study equation, is useful in  patients with stable renal function. The eGFR calculation will not be reliable in acutely ill patients when serum creatinine is changing rapidly. It is not useful in patients on dialysis. The eGFR calculation may not be applicable to patients at the low and high extremes of body sizes, pregnant women, and vegetarians.   04/04/2014 54 (L)  Final    Comment:    eGFR values <28m48mn/1.73 m2 may be an indication of chronic kidney disease (CKD). Calculated eGFR is useful in patients with stable renal function. The eGFR calculation will not be reliable in acutely ill patients when serum  creatinine is changing rapidly. It is not useful in  patients on dialysis. The eGFR calculation may not be applicable to patients at the low and high extremes of body sizes, pregnant women, and vegetarians.    GFR calc non Af Amer  Date Value Ref Range Status  08/15/2018 68 >59 mL/min/1.73 Final   eGFR  Date Value Ref Range Status  12/31/2020 88 >59 mL/min/1.73 Final         Passed - Valid encounter within last 6 months    Recent Outpatient Visits          3 weeks ago [facility]   1 year [facility]   2 years ago [facility] without [facility]   2 years ago [facility]   2 years ago [facility]  Future Appointments            In 3 days Debroah Loop, [place]   In 4 months [name]., MD [facility], [facility]

## 2021-01-28 NOTE — Telephone Encounter (Signed)
Please advise 

## 2021-01-28 NOTE — Telephone Encounter (Signed)
Roger Rodriguez, also advised 1x weekly x4 weeks.

## 2021-01-29 NOTE — Telephone Encounter (Signed)
Advised 1 month. X1 weekly.

## 2021-01-29 NOTE — Telephone Encounter (Signed)
Medication requested was sent to pharmacy by provider 01/18/2021.

## 2021-01-30 ENCOUNTER — Ambulatory Visit: Payer: PPO | Admitting: Physician Assistant

## 2021-01-30 ENCOUNTER — Other Ambulatory Visit: Payer: Self-pay

## 2021-01-30 DIAGNOSIS — Z435 Encounter for attention to cystostomy: Secondary | ICD-10-CM

## 2021-01-30 DIAGNOSIS — B372 Candidiasis of skin and nail: Secondary | ICD-10-CM

## 2021-01-30 MED ORDER — KETOCONAZOLE 2 % EX CREA: 1.0000 "application " | TOPICAL_CREAM | Freq: Two times a day (BID) | CUTANEOUS | 1 refills | Status: DC

## 2021-01-30 NOTE — Progress Notes (Signed)
Suprapubic Cath Change  Patient is present today for a suprapubic catheter change due to urinary retention.  87ml of water was drained from the balloon, a 16FR foley cath was removed from the tract with out difficulty.  Site was cleaned and prepped in a sterile fashion with betadine.  A 16FR foley cath was replaced into the tract no complications were noted. Urine return was noted, 10 ml of sterile water was inflated into the balloon and a leg bag on extension tubing was attached for drainage.  Patient tolerated well.  Performed by: Debroah Loop, PA-C   Additional notes: Intertrigo on physical exam today, prescribing ketoconazole cream.  Follow up: Return in about 4 weeks (around 02/27/2021) for SPT exchange.

## 2021-02-05 ENCOUNTER — Telehealth: Payer: Self-pay | Admitting: Family Medicine

## 2021-02-05 NOTE — Telephone Encounter (Signed)
[place] with [facility] for PT verbal orders 2 wk 3 1 wk 4  For re certification.  Work on strengthening, range of motion, and transfers   Need a prescription for a Sit stand lift to be sent to a [place]

## 2021-02-05 NOTE — Telephone Encounter (Signed)
Please advise 

## 2021-02-06 NOTE — Telephone Encounter (Signed)
Returned call to give verbal orders. No answer and vm was full. Will try again later. Order for lift faxed.

## 2021-02-07 NOTE — Telephone Encounter (Signed)
Tried calling with verbal orders again. No answer and no vm.

## 2021-02-08 ENCOUNTER — Other Ambulatory Visit: Payer: Self-pay | Admitting: Family Medicine

## 2021-02-08 NOTE — Telephone Encounter (Signed)
last RF 11/13/20 #60 3 RF

## 2021-02-12 NOTE — Telephone Encounter (Signed)
3rd attempt made to give verbal orders. No answer and vm is still full. [place] instead. Gave verbal orders to nurse through their call center.

## 2021-02-13 ENCOUNTER — Telehealth: Payer: Self-pay

## 2021-02-13 NOTE — Telephone Encounter (Signed)
Please review.  Do you have these orders?  Thanks,   -Mickel Baas

## 2021-02-13 NOTE — Telephone Encounter (Signed)
Copied from [place] [phone]. Topic: General - Other >> Feb 13, 2021 10:24 AM Tessa Lerner A wrote: Reason for CRM: Deanna with [facility] has called regarding order # [id number]   The order is for a [place] sent via fax on 02/06/21  Please contact further when possible

## 2021-02-14 ENCOUNTER — Other Ambulatory Visit: Payer: Self-pay | Admitting: Family Medicine

## 2021-02-17 NOTE — Telephone Encounter (Signed)
Orders received. Waiting for provider to sign.

## 2021-02-18 DIAGNOSIS — R1310 Dysphagia, unspecified: Secondary | ICD-10-CM | POA: Diagnosis not present

## 2021-02-18 DIAGNOSIS — N529 Male erectile dysfunction, unspecified: Secondary | ICD-10-CM | POA: Diagnosis not present

## 2021-02-18 DIAGNOSIS — E785 Hyperlipidemia, unspecified: Secondary | ICD-10-CM | POA: Diagnosis not present

## 2021-02-18 DIAGNOSIS — Z993 Dependence on wheelchair: Secondary | ICD-10-CM | POA: Diagnosis not present

## 2021-02-18 DIAGNOSIS — M1990 Unspecified osteoarthritis, unspecified site: Secondary | ICD-10-CM | POA: Diagnosis not present

## 2021-02-18 DIAGNOSIS — F0280 Dementia in other diseases classified elsewhere without behavioral disturbance: Secondary | ICD-10-CM | POA: Diagnosis not present

## 2021-02-18 DIAGNOSIS — G2581 Restless legs syndrome: Secondary | ICD-10-CM | POA: Diagnosis not present

## 2021-02-18 DIAGNOSIS — F32A Depression, unspecified: Secondary | ICD-10-CM | POA: Diagnosis not present

## 2021-02-18 DIAGNOSIS — F419 Anxiety disorder, unspecified: Secondary | ICD-10-CM | POA: Diagnosis not present

## 2021-02-18 DIAGNOSIS — M48061 Spinal stenosis, lumbar region without neurogenic claudication: Secondary | ICD-10-CM | POA: Diagnosis not present

## 2021-02-18 DIAGNOSIS — G20 Parkinson's disease: Secondary | ICD-10-CM | POA: Diagnosis not present

## 2021-02-18 DIAGNOSIS — I10 Essential (primary) hypertension: Secondary | ICD-10-CM | POA: Diagnosis not present

## 2021-02-18 DIAGNOSIS — Z791 Long term (current) use of non-steroidal anti-inflammatories (NSAID): Secondary | ICD-10-CM | POA: Diagnosis not present

## 2021-02-19 ENCOUNTER — Telehealth: Payer: Self-pay | Admitting: Family Medicine

## 2021-02-19 ENCOUNTER — Other Ambulatory Visit: Payer: Self-pay | Admitting: Family Medicine

## 2021-02-19 MED ORDER — ENALAPRIL MALEATE 2.5 MG PO TABS: 2.5000 mg | ORAL_TABLET | Freq: Two times a day (BID) | ORAL | 1 refills | Status: DC

## 2021-02-19 NOTE — Telephone Encounter (Signed)
Roger Rodriguez with [facility] called 7/27 and requested  A sit to stand lift. Would like that sent to a DME company.  Pt following up on that request

## 2021-02-19 NOTE — Telephone Encounter (Signed)
Medication: enalapril (VASOTEC) 2.5 MG tablet  Has the pt contacted their pharmacy? No he asked his PT therapist to put in request  [id number] [place] [place] Please be advised refills may take up to 3 business days.  We ask that you follow up with your pharmacy.

## 2021-02-19 NOTE — Telephone Encounter (Signed)
[facility] and spoke to [place], [facility] about the refill(s) Enalapril requested. Advised it was sent on 11/13/20 #60/3 refill(s). She says the patient received #180 on 11/13/20 and #60 on 02/11/21, so that is the entire prescription, no refills left. I advised I will go ahead and send in the refill so that he will not be without when it's time on 03/14/21.

## 2021-02-19 NOTE — Telephone Encounter (Signed)
Order was faxed to our office. Placed in box for you to review. Thanks!

## 2021-02-26 ENCOUNTER — Telehealth: Payer: Self-pay | Admitting: Family Medicine

## 2021-02-26 NOTE — Telephone Encounter (Signed)
Roger Rodriguez PTA with [facility] is calling to report pt had missed PT visit today. Pt said he has doctor appt today

## 2021-02-26 NOTE — Telephone Encounter (Signed)
FYI

## 2021-03-04 ENCOUNTER — Other Ambulatory Visit: Payer: Self-pay

## 2021-03-04 ENCOUNTER — Other Ambulatory Visit: Payer: PPO | Admitting: Student

## 2021-03-04 DIAGNOSIS — Z9359 Other cystostomy status: Secondary | ICD-10-CM | POA: Diagnosis not present

## 2021-03-04 DIAGNOSIS — Z515 Encounter for palliative care: Secondary | ICD-10-CM | POA: Diagnosis not present

## 2021-03-04 DIAGNOSIS — G20 Parkinson's disease: Secondary | ICD-10-CM | POA: Diagnosis not present

## 2021-03-04 NOTE — Progress Notes (Signed)
[facility] Consult Note Telephone: [phone]  Fax: [phone]    Date of encounter: 03/04/21 2:23 PM PATIENT NAME: Roger Rodriguez [place] [id number]   [phone] (home)  DOB: 26-Sep-1940 MRN: [id number] PRIMARY CARE PROVIDER:    Jerrol Rodriguez., MD,  [address] [place] [address] [phone]  REFERRING PROVIDER:   Jerrol Rodriguez., MD [address] [place],  [place] [address] [phone]  RESPONSIBLE PARTY:    Contact Information     Name Relation Home Work Roger Rodriguez [phone]     Roger Rodriguez [phone]          I met face to face with patient and caregiver in the home. Palliative Care was asked to follow this patient by consultation request of  Roger Rodriguez.,* to address advance care planning and complex medical decision making. This is a follow up visit.                                   ASSESSMENT AND PLAN / RECOMMENDATIONS:   Advance Care Planning/Goals of Care: Goals include to maximize quality of life and symptom management.  CODE STATUS: DNR  Symptom Management/Plan:  Parkinson's Disease-patient continues with PT due to weakness and "freezing" of his arms. Continue Rytary as directed; restarted last week. Continue ST as directed; to trial microphone for voice. Follow up with Neurologist as scheduled. Patient awaiting sit to stand lift; will have Palliative Nurse Navigator f/u with PCP.  Suprapubic catheter-catheter changed out monthly per Urology. Patient has anchor in place. Denies any concerns with catheter. Follow up with Urology as scheduled.  Follow up Palliative Care Visit: Palliative care will continue to follow for complex medical decision making, advance care planning, and clarification of goals. Return in 8 weeks or prn.  I spent 40 minutes providing this consultation. More than 50% of  the time in this consultation was spent in counseling and care coordination.   PPS: 30%  HOSPICE ELIGIBILITY/DIAGNOSIS: TBD  Chief Complaint: Palliative Medicine follow up visit.   HISTORY OF PRESENT ILLNESS:  Roger Rodriguez is a 80 y.o. year old male  with Parkinson's disease, OA, CKD, hypertension, depression.   Patient reports being stable, he denies any changes since last palliative visit. He states PT is still coming out through [facility]. He endorses generalized weakness. He has not received a sit to stand lift yet; he is awaiting return call. His pain is managed with current regimen. He denies shortness of breath. He endorses a good appetite. No increased swallowing difficulty. He is sleeping well at night. Blood pressure has been better since starting enalapril 2.5 mg BID; he is needing a refill. B/p has been 352'Y to 818'H systolic and 90-93'J diastolic. Blood sugar checked each am; ranges between 120-117m /dL. No recent falls or injury. No recent infections. No recent ER visits or hospitalizations. A 10-point review of systems is negative, except for the pertinent positives and negatives detailed in the HPI.    History obtained from review of EMR, discussion with primary team, and interview with family, facility staff/caregiver and/or Roger Rodriguez  I reviewed available labs, medications, imaging, studies and related documents from the EMR.  Records reviewed and summarized above.    Physical Exam: Pulse 96, resp 20, b/p 122/ 78, sats 94% on room air  Constitutional: NAD General: frail appearing EYES: anicteric sclera, lids intact, no discharge  ENMT: intact hearing, oral mucous membranes moist CV: S1S2, RRR, trace pedal edema Pulmonary: LCTA, no increased work of breathing, no cough Abdomen: normo-active BS + 4 quadrants, soft and non tender, suprapubic catheter GU: deferred MSK: moves all extremities, non-ambulatory Skin: warm and dry, no rashes or wounds on visible skin Neuro:  generalized weakness, A & O x 3, + tremor Psych: non-anxious affect, pleasant Hem/lymph/immuno: no widespread bruising   Thank you for the opportunity to participate in the care of Roger Rodriguez.  The palliative care team will continue to follow. Please call our office at [phone] if we can be of additional assistance.   Roger Slocumb, NP   COVID-19 PATIENT SCREENING TOOL Asked and negative response unless otherwise noted:   Have you had symptoms of covid, tested positive or been in contact with someone with symptoms/positive test in the past 5-10 days?  No

## 2021-03-11 ENCOUNTER — Ambulatory Visit: Payer: Self-pay | Admitting: Urology

## 2021-03-11 NOTE — Progress Notes (Deleted)
Suprapubic Cath Change  Patient is present today for a suprapubic catheter change due to urinary retention.  ***ml of water was drained from the balloon, a ***FR foley cath was removed from the tract with out difficulty.  Site was cleaned and prepped in a sterile fashion with betadine.  A ***FR foley cath was replaced into the tract {dnt complications:20057}. Urine return was noted, 10 ml of sterile water was inflated into the balloon and a *** bag was attached for drainage.  Patient tolerated well. A night bag was given to patient and proper instruction was given on how to switch bags.    Preformed by: ***  Follow up: ***

## 2021-03-12 ENCOUNTER — Telehealth: Payer: Self-pay

## 2021-03-12 NOTE — Telephone Encounter (Signed)
Copied from [place] [id number]. Topic: General - Other >> Mar 11, 2021  5:12 PM Pawlus, Brayton Layman A wrote: Reason for CRM: [facility] home health called to let [name] know that the pt refused OT evaluation.

## 2021-03-13 DIAGNOSIS — G2581 Restless legs syndrome: Secondary | ICD-10-CM | POA: Diagnosis not present

## 2021-03-13 DIAGNOSIS — E785 Hyperlipidemia, unspecified: Secondary | ICD-10-CM | POA: Diagnosis not present

## 2021-03-13 DIAGNOSIS — F419 Anxiety disorder, unspecified: Secondary | ICD-10-CM | POA: Diagnosis not present

## 2021-03-13 DIAGNOSIS — F0280 Dementia in other diseases classified elsewhere without behavioral disturbance: Secondary | ICD-10-CM | POA: Diagnosis not present

## 2021-03-13 DIAGNOSIS — Z791 Long term (current) use of non-steroidal anti-inflammatories (NSAID): Secondary | ICD-10-CM | POA: Diagnosis not present

## 2021-03-13 DIAGNOSIS — R1310 Dysphagia, unspecified: Secondary | ICD-10-CM | POA: Diagnosis not present

## 2021-03-13 DIAGNOSIS — M1990 Unspecified osteoarthritis, unspecified site: Secondary | ICD-10-CM | POA: Diagnosis not present

## 2021-03-13 DIAGNOSIS — F32A Depression, unspecified: Secondary | ICD-10-CM | POA: Diagnosis not present

## 2021-03-13 DIAGNOSIS — M48061 Spinal stenosis, lumbar region without neurogenic claudication: Secondary | ICD-10-CM | POA: Diagnosis not present

## 2021-03-13 DIAGNOSIS — N529 Male erectile dysfunction, unspecified: Secondary | ICD-10-CM | POA: Diagnosis not present

## 2021-03-13 DIAGNOSIS — Z993 Dependence on wheelchair: Secondary | ICD-10-CM | POA: Diagnosis not present

## 2021-03-13 DIAGNOSIS — G20 Parkinson's disease: Secondary | ICD-10-CM | POA: Diagnosis not present

## 2021-03-13 DIAGNOSIS — I10 Essential (primary) hypertension: Secondary | ICD-10-CM | POA: Diagnosis not present

## 2021-03-13 NOTE — Progress Notes (Signed)
Suprapubic Cath Change  Patient is present today for a suprapubic catheter change due to urinary retention.  9 ml of water was drained from the balloon, a 16 FR foley cath was removed from the tract with out difficulty.  Site was cleaned and prepped in a sterile fashion with betadine.  A 16 FR foley cath was replaced into the tract no complications were noted. Urine return was noted, 10 ml of sterile water was inflated into the balloon and a leg bag was attached for drainage.  Patient tolerated well. A night bag was given to patient and proper instruction was given on how to switch bags.    Performed by: Zara Council, PA-C and Crysta S. Albright, CMA  Follow up: One month for SPT exchange

## 2021-03-14 ENCOUNTER — Ambulatory Visit: Payer: PPO | Admitting: Urology

## 2021-03-14 ENCOUNTER — Other Ambulatory Visit: Payer: Self-pay

## 2021-03-14 DIAGNOSIS — Z9359 Other cystostomy status: Secondary | ICD-10-CM

## 2021-03-21 DIAGNOSIS — F419 Anxiety disorder, unspecified: Secondary | ICD-10-CM | POA: Diagnosis not present

## 2021-03-21 DIAGNOSIS — F0280 Dementia in other diseases classified elsewhere without behavioral disturbance: Secondary | ICD-10-CM | POA: Diagnosis not present

## 2021-03-21 DIAGNOSIS — M48061 Spinal stenosis, lumbar region without neurogenic claudication: Secondary | ICD-10-CM | POA: Diagnosis not present

## 2021-03-21 DIAGNOSIS — Z791 Long term (current) use of non-steroidal anti-inflammatories (NSAID): Secondary | ICD-10-CM | POA: Diagnosis not present

## 2021-03-21 DIAGNOSIS — N529 Male erectile dysfunction, unspecified: Secondary | ICD-10-CM | POA: Diagnosis not present

## 2021-03-21 DIAGNOSIS — E785 Hyperlipidemia, unspecified: Secondary | ICD-10-CM | POA: Diagnosis not present

## 2021-03-21 DIAGNOSIS — M1990 Unspecified osteoarthritis, unspecified site: Secondary | ICD-10-CM | POA: Diagnosis not present

## 2021-03-21 DIAGNOSIS — R1310 Dysphagia, unspecified: Secondary | ICD-10-CM | POA: Diagnosis not present

## 2021-03-21 DIAGNOSIS — Z993 Dependence on wheelchair: Secondary | ICD-10-CM | POA: Diagnosis not present

## 2021-03-21 DIAGNOSIS — I10 Essential (primary) hypertension: Secondary | ICD-10-CM | POA: Diagnosis not present

## 2021-03-21 DIAGNOSIS — G2581 Restless legs syndrome: Secondary | ICD-10-CM | POA: Diagnosis not present

## 2021-03-21 DIAGNOSIS — F32A Depression, unspecified: Secondary | ICD-10-CM | POA: Diagnosis not present

## 2021-03-21 DIAGNOSIS — G20 Parkinson's disease: Secondary | ICD-10-CM | POA: Diagnosis not present

## 2021-03-28 ENCOUNTER — Telehealth: Payer: Self-pay | Admitting: Family Medicine

## 2021-03-28 NOTE — Telephone Encounter (Signed)
Please review. Thanks!  

## 2021-03-28 NOTE — Telephone Encounter (Signed)
Chasity with HTA called saying she does not see where th patient is on a statin for his cholesterol.  She wants to know if Dr. Rosanna Randy or nurse to call her back  CB#  [phone]

## 2021-03-31 NOTE — Telephone Encounter (Signed)
Copied from [place] [phone]. Topic: General - Other >> Mar 31, 2021  2:32 PM Celene Kras wrote: Reason for CRM: Pt called in regards to a lift that was supposed to be ordered for him. He is requesting to have PCP give a call back with info. Please advise.

## 2021-04-01 NOTE — Telephone Encounter (Signed)
Pt called asking about the sit/stand lift that was supposed to be ordered for him.  He wants to know the status of the order.  He has not heard anything back.  CB#  [phone]

## 2021-04-01 NOTE — Telephone Encounter (Signed)
Order for sit stand lift was faced on 02/05/2021 to [facility].

## 2021-04-08 ENCOUNTER — Telehealth: Payer: Self-pay | Admitting: Family Medicine

## 2021-04-08 NOTE — Telephone Encounter (Signed)
Left detailed message on patient's vm advising we have faxed order on multiple occasions to [place].

## 2021-04-08 NOTE — Telephone Encounter (Signed)
Patient returned call and was advised message below. Patient requested order be faxed to [facility] [place]. Order was faxed.

## 2021-04-08 NOTE — Telephone Encounter (Signed)
Home Health Verbal Orders - [facility]  Callback Number: [phone]  Requesting OT/PT/Skilled Nursing/Social Work/Speech Therapy: PT Recertification  Frequency: 1w6 effective 09/27

## 2021-04-09 NOTE — Telephone Encounter (Signed)
L/M advising as below.  

## 2021-04-14 NOTE — Progress Notes (Signed)
Suprapubic Cath Change  Patient is present today for a suprapubic catheter change due to urinary retention.  8 ml of water was drained from the balloon, a 16 FR foley cath was removed from the tract with out difficulty.  Site was cleaned and prepped in a sterile fashion with betadine.  A 16 FR foley cath was replaced into the tract no complications were noted. Urine return was noted, 10 ml of sterile water was inflated into the balloon and a leg bag was attached for drainage.  Patient tolerated well. A night bag was given to patient and proper instruction was given on how to switch bags.    Performed by: Zara Council, PA-C and Kyra Manges, CMA  Follow up: One month for SPT exchange

## 2021-04-15 ENCOUNTER — Other Ambulatory Visit: Payer: Self-pay

## 2021-04-15 ENCOUNTER — Ambulatory Visit: Payer: PPO | Admitting: Urology

## 2021-04-15 DIAGNOSIS — Z9359 Other cystostomy status: Secondary | ICD-10-CM

## 2021-04-18 ENCOUNTER — Telehealth: Payer: PPO | Admitting: Emergency Medicine

## 2021-04-18 ENCOUNTER — Ambulatory Visit: Payer: Self-pay

## 2021-04-18 ENCOUNTER — Ambulatory Visit: Payer: Self-pay | Admitting: *Deleted

## 2021-04-18 DIAGNOSIS — J40 Bronchitis, not specified as acute or chronic: Secondary | ICD-10-CM

## 2021-04-18 DIAGNOSIS — R051 Acute cough: Secondary | ICD-10-CM | POA: Diagnosis not present

## 2021-04-18 MED ORDER — PREDNISONE 10 MG (21) PO TBPK: ORAL_TABLET | Freq: Every day | ORAL | 0 refills | Status: DC

## 2021-04-18 MED ORDER — DOXYCYCLINE HYCLATE 100 MG PO TABS: 100.0000 mg | ORAL_TABLET | Freq: Two times a day (BID) | ORAL | 0 refills | Status: AC

## 2021-04-18 NOTE — Telephone Encounter (Signed)
Unable to reach son left detailed message stating that a office visit would be needed for evaluation given symptoms 24hrs antibiotic would not be appropriate at this time. Encouraged son to try otc Mucinex, Zyrtec, Flonase and Robitussin. Advised that if patients condition worsens over weekend he should seek treatment at urgent care facility over weekend. KW

## 2021-04-18 NOTE — Progress Notes (Addendum)
I, Lestine Box, PA-C, attempted to connect with Roger Rodriguez; MRN [id number] on 04/18/21 via [facility] to complete a video urgent care visit. The patient was unable to successfully connect to the video platform. As such, the patient was contacted by this provider via phone to complete the encounter.    Virtual Visit Consent   Roger Rodriguez, you are scheduled for a virtual visit with a [facility] provider today.     Just as with appointments in the office, your consent must be obtained to participate.  Your consent will be active for this visit and any virtual visit you may have with one of our providers in the next 365 days.     If you have a MyChart account, a copy of this consent can be sent to you electronically.  All virtual visits are billed to your insurance company just like a traditional visit in the office.    As this is a virtual visit, video technology does not allow for your provider to perform a traditional examination.  This may limit your provider's ability to fully assess your condition.  If your provider identifies any concerns that need to be evaluated in person or the need to arrange testing (such as labs, EKG, etc.), we will make arrangements to do so.     Although advances in technology are sophisticated, we cannot ensure that it will always work on either your end or our end.  If the connection with a video visit is poor, the visit may have to be switched to a telephone visit.  With either a video or telephone visit, we are not always able to ensure that we have a secure connection.     I need to obtain your verbal consent now.   Are you willing to proceed with your visit today?    Roger Rodriguez has provided verbal consent on 04/18/2021 for a virtual visit (video or telephone).   Lestine Box, [place]   Date: 04/18/2021 5:15 PM   Virtual Visit via Video Note   I, Lestine Box, connected with  Roger Rodriguez  ([id number], May 18, 1941) on 04/18/21 at  4:45 PM EDT by a  video-enabled telemedicine application and verified that I am speaking with the correct person using two identifiers.  Location: Patient: Virtual Visit Location Patient: Home Provider: Virtual Visit Location Provider: Home Office  I discussed the limitations of evaluation and management by telemedicine and the availability of in person appointments. The patient expressed understanding and agreed to proceed.    History of Present Illness: Roger Rodriguez is a 80 y.o. who identifies as a male who was assigned male at birth, and is being seen today for non-productive cough, runny nose, sinus congestion, and chest congestion x 2 day.  HPI obtained from son, Roger Rodriguez, patient by his side on video.  Hx significant for parkinson's.  Denies positive covid exposure.  Two negative covid tests at home.  Has NOT tried OTC medications without relief.  Denies aggravating factors.  Denies previous covid or PNA in the past.  Reports fatigue, and slight wheezes.  Denies fever, chills, nausea, vomiting, SOB, chest pain, abdominal pain, or changes in bowel or bladder habits.     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/16/2018  Chronic venous insufficiency 11/22/2017   ARF (acute renal failure) ([place]) 10/12/2017   RUQ pain 10/22/2016   Depression, major, single episode, moderate ([place]) 10/22/2016   Renal cyst, right 09/28/2016   Hepatic steatosis 09/28/2016   Parkinson's disease ([place]) 09/18/2015   Restless leg 09/18/2015   Malignant neoplasm of prostate ([place]) 05/22/2015   Hepatitis A 04/02/2015   Allergic rhinitis 04/02/2015   Arthritis 04/02/2015   Narrowing of intervertebral disc space 04/02/2015   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   Essential (primary) hypertension  04/02/2015   Borderline diabetes 04/02/2015   Hyperlipidemia 04/02/2015   BP (high blood pressure) 04/02/2015   Infected sebaceous cyst 04/02/2015   Hernia, inguinal 04/02/2015   Leg pain 04/02/2015   Lumbar canal stenosis 04/02/2015   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   Malignant melanoma ([place]) 04/02/2015   Arthritis, degenerative 04/02/2015   Fatigue 04/02/2015   Idiopathic Parkinson's disease ([place]) 04/02/2015   Plantar fasciitis 04/02/2015   CA of prostate ([place]) 04/02/2015   Benign essential tremor 04/02/2015   Lower urinary tract infection 04/02/2015   Degeneration of intervertebral disc of lumbosacral region 05/10/2012    Allergies:  Allergies  Allergen Reactions   Sulfa Antibiotics Swelling    Lip swelling   Medications:  Current Outpatient Medications:    doxycycline (VIBRA-TABS) 100 MG tablet, Take 1 tablet (100 mg total) by mouth 2 (two) times daily for 10 days., Disp: 20 tablet, Rfl: 0   predniSONE (STERAPRED UNI-PAK 21 TAB) 10 MG (21) TBPK tablet, Take by mouth daily. Take 6 tabs by mouth daily  for 2 days, then 5 tabs for 2 days, then 4 tabs for 2 days, then 3 tabs for 2 days, 2 tabs for 2 days, then 1 tab by mouth daily for 2 days, Disp: 42 tablet, Rfl: 0   amantadine (SYMMETREL) 100 MG capsule, Take 100 mg by mouth daily as needed., Disp: , Rfl:    budesonide (RHINOCORT AQUA) 32 MCG/ACT nasal spray, USE 1 PUFF IN EACH NOSTRIL EVERY DAY AS NEEDED (Patient taking differently: Place 1 spray into both nostrils daily as needed for allergies.), Disp: 1 Bottle, Rfl: 5   busPIRone (BUSPAR) 7.5 MG tablet, Take 7.5 mg by mouth 2 (two) times daily. To 3 times daily (Patient not taking: Reported on 01/20/2021), Disp: , Rfl:    celecoxib (CELEBREX) 100 MG capsule, TAKE 1 CAPSULE BY MOUTH TWICE A DAY, Disp: 60 capsule, Rfl: 2   cholecalciferol (VITAMIN D) 1000 units tablet, Take 1,000 Units by mouth daily. , Disp: , Rfl:    enalapril (VASOTEC) 2.5 MG tablet, Take 1 tablet  (2.5 mg total) by mouth 2 (two) times daily., Disp: 180 tablet, Rfl: 1   gabapentin (NEURONTIN) 100 MG capsule, Take 200 mg by mouth 3 (three) times daily., Disp: , Rfl:    glucose blood (QUINTET AC BLOOD GLUCOSE TEST) test strip, Use as instructed, Disp: 100 each, Rfl: 12   GOCOVRI 137 MG CP24, Take by mouth., Disp: , Rfl:    Hypromellose (ARTIFICIAL TEARS OP), Place 1 drop into both eyes 4 (four) times daily as needed (for dry eyes). , Disp: , Rfl:    insulin aspart (NOVOLOG) 100 UNIT/ML injection, Inject 0-15 Units into the skin 3 (three) times daily with meals., Disp: 10 mL, Rfl: 11   ketoconazole (NIZORAL) 2 % cream, Apply 1 application topically 2 (two) times daily., Disp: 30 g, Rfl: 1   loratadine (CLARITIN) 10 MG tablet, Take 10 mg by mouth daily., Disp: ,  Rfl:    metFORMIN (GLUCOPHAGE) 500 MG tablet, TAKE 1 TABLET BY MOUTH 2 TIMES DAILY WITH A MEAL., Disp: 180 tablet, Rfl: 1   mirabegron ER (MYRBETRIQ) 25 MG TB24 tablet, Take 1 tablet (25 mg total) by mouth daily. (Patient not taking: Reported on 01/20/2021), Disp: 28 tablet, Rfl: 0   Multiple Vitamin (MULTIVITAMIN WITH MINERALS) TABS tablet, Take 1 tablet by mouth daily., Disp: , Rfl:    NOURIANZ 20 MG TABS, Take 1 tablet by mouth at bedtime. (Patient not taking: Reported on 01/20/2021), Disp: , Rfl:    polyethylene glycol (MIRALAX / GLYCOLAX) packet, Take 17 g by mouth daily as needed. , Disp: , Rfl:    pramipexole (MIRAPEX) 0.5 MG tablet, Take 2 tablets (1 mg total) by mouth at bedtime. (Patient not taking: Reported on 01/20/2021), Disp: 30 tablet, Rfl: 0   QUEtiapine (SEROQUEL) 25 MG tablet, Take 25 mg by mouth at bedtime., Disp: , Rfl:    RYTARY 61.25-245 MG CPCR, Take 1 capsule by mouth 4 (four) times daily., Disp: , Rfl:    sertraline (ZOLOFT) 25 MG tablet, Take 25 mg by mouth daily. (Patient not taking: Reported on 01/20/2021), Disp: , Rfl:    vitamin B-12 (CYANOCOBALAMIN) 1000 MCG tablet, Take 1,000 mcg by mouth daily., Disp: , Rfl:     vitamin C (ASCORBIC ACID) 500 MG tablet, Take 1,000 mg by mouth daily. , Disp: , Rfl:    vitamin E 180 MG (400 UNITS) capsule, Take 400 Units by mouth daily. , Disp: , Rfl:   Observations/Objective: Patient appears chronically ill, not in acute distress, sitting in wheelchair Resting comfortably at home.  Head is normocephalic, atraumatic.  No labored breathing.  Speech is unclear and somewhat incoherent with logical content. Son states this is his baseline secondary to parkinson's  Vital signs per son at home:  SpO2 is 93%; 86 BPM  Assessment and Plan: 1. Acute cough  2. Bronchitis  Get rest, and drink fluids You may use OTC allergy medication and flonase to help with congestion  Prednisone pak prescribed.  Take as directed and to completion Doxycycline prescribed.  Please begin taking this medication in 2-3 days if symptoms do not improve with steroid Use throat lozenges, hot tea, honey, and avoid second-hand smoke  Follow up in person at urgent care or the ED if symptoms worsen or do not improve with above medications Call 911 or go the the ED if you have chest pain, shortness of breath, difficulty breathing, dizziness, feeling faint, weakness, etc...  Follow Up Instructions: I discussed the assessment and treatment plan with the patient. The patient was provided an opportunity to ask questions and all were answered. The patient agreed with the plan and demonstrated an understanding of the instructions.  A copy of instructions were sent to the patient via MyChart unless otherwise noted below.    The patient was advised to call back or seek an in-person evaluation if the symptoms worsen or if the condition fails to improve as anticipated.  Time:  I spent 20 minutes with the patient via telehealth technology discussing the above problems/concerns.    Lestine Box, PA-C

## 2021-04-18 NOTE — Telephone Encounter (Signed)
Please review for Dr. Gilbert  Thanks,   -Roger Rodriguez  

## 2021-04-18 NOTE — Telephone Encounter (Signed)
Pt.'s son called back, and [place]. Asking for a call back from the practice and for an antibiotic to be called in to pharmacy. Contact number [phone]. Please advise son.

## 2021-04-18 NOTE — Telephone Encounter (Signed)
Pt.'s son reports he started coughing last night and has a runny nose with yellow mucus. 3 COVID 19 tests negative. Concerned because pt. Is bedridden and has Parkinson's. No availability in the practice today. Will do tele health visit through [email or web address].   Reason for Disposition  [1] Continuous (nonstop) coughing interferes with work or school AND [2] no improvement using cough treatment per Care Advice  Answer Assessment - Initial Assessment Questions 1. ONSET: "When did the cough begin?"      Last night 2. SEVERITY: "How bad is the cough today?"      Moderate 3. SPUTUM: "Describe the color of your sputum" (none, dry cough; clear, white, yellow, green)     No 4. HEMOPTYSIS: "Are you coughing up any blood?" If so ask: "How much?" (flecks, streaks, tablespoons, etc.)     No 5. DIFFICULTY BREATHING: "Are you having difficulty breathing?" If Yes, ask: "How bad is it?" (e.g., mild, moderate, severe)    - MILD: No SOB at rest, mild SOB with walking, speaks normally in sentences, can lie down, no retractions, pulse < 100.    - MODERATE: SOB at rest, SOB with minimal exertion and prefers to sit, cannot lie down flat, speaks in phrases, mild retractions, audible wheezing, pulse 100-120.    - SEVERE: Very SOB at rest, speaks in single words, struggling to breathe, sitting hunched forward, retractions, pulse > 120      Mild 6. FEVER: "Do you have a fever?" If Yes, ask: "What is your temperature, how was it measured, and when did it start?"     No 7. CARDIAC HISTORY: "Do you have any history of heart disease?" (e.g., heart attack, congestive heart failure)      No 8. LUNG HISTORY: "Do you have any history of lung disease?"  (e.g., pulmonary embolus, asthma, emphysema)     No 9. PE RISK FACTORS: "Do you have a history of blood clots?" (or: recent major surgery, recent prolonged travel, bedridden)     No 10. OTHER SYMPTOMS: "Do you have any other symptoms?" (e.g., runny nose, wheezing, chest  pain)       Runny nose 11. PREGNANCY: "Is there any chance you are pregnant?" "When was your last menstrual period?"       N/a 12. TRAVEL: "Have you traveled out of the country in the last month?" (e.g., travel history, exposures)       No  Protocols used: Cough - Acute Non-Productive-A-AH

## 2021-04-18 NOTE — Telephone Encounter (Signed)
Call returned by patient's son , [name],  on [place]. Son would like to bring patient in to clinic now due to symptoms . Patient has already been triaged today . Patient's son angry he has not been notified from PCP medications to give patient and what medications he can give patient OTC to treat symptoms of "chest cold". Son denies patient has fever and no change in status from first call at this time. Patient's son reports he does not want his "dad" to get pneumonia. Patient difficult with mobility , instructed patient's son if symptoms worsen to call 911 and patient can be seen in ED. Instructed patient's son to call pharmacist for recommendations for OTC medications patient can take due to Hx Parkinson's Disease.  Patient's son demanding a call back either way regarding PCP response. Attempted to de escalate conversation and will contact patient's son back today . Patient's son aware primary dr. Not available and aware another provider will have to assess patient's needs. Please advise . Care advise given . Patient's son verbalized understanding NT will call back even if no medications will be prescribed.

## 2021-04-18 NOTE — Patient Instructions (Signed)
Roger Rodriguez, thank you for joining [place], PA-C for today's virtual visit.  While this provider is not your primary care provider (PCP), if your PCP is located in our provider database this encounter information will be shared with them immediately following your visit.  Consent: (Patient) Roger Rodriguez provided verbal consent for this virtual visit at the beginning of the encounter.  Current Medications:  Current Outpatient Medications:    doxycycline (VIBRA-TABS) 100 MG tablet, Take 1 tablet (100 mg total) by mouth 2 (two) times daily for 10 days., Disp: 20 tablet, Rfl: 0   predniSONE (STERAPRED UNI-PAK 21 TAB) 10 MG (21) TBPK tablet, Take by mouth daily. Take 6 tabs by mouth daily  for 2 days, then 5 tabs for 2 days, then 4 tabs for 2 days, then 3 tabs for 2 days, 2 tabs for 2 days, then 1 tab by mouth daily for 2 days, Disp: 42 tablet, Rfl: 0   amantadine (SYMMETREL) 100 MG capsule, Take 100 mg by mouth daily as needed., Disp: , Rfl:    budesonide (RHINOCORT AQUA) 32 MCG/ACT nasal spray, USE 1 PUFF IN EACH NOSTRIL EVERY DAY AS NEEDED (Patient taking differently: Place 1 spray into both nostrils daily as needed for allergies.), Disp: 1 Bottle, Rfl: 5   busPIRone (BUSPAR) 7.5 MG tablet, Take 7.5 mg by mouth 2 (two) times daily. To 3 times daily (Patient not taking: Reported on 01/20/2021), Disp: , Rfl:    celecoxib (CELEBREX) 100 MG capsule, TAKE 1 CAPSULE BY MOUTH TWICE A DAY, Disp: 60 capsule, Rfl: 2   cholecalciferol (VITAMIN D) 1000 units tablet, Take 1,000 Units by mouth daily. , Disp: , Rfl:    enalapril (VASOTEC) 2.5 MG tablet, Take 1 tablet (2.5 mg total) by mouth 2 (two) times daily., Disp: 180 tablet, Rfl: 1   gabapentin (NEURONTIN) 100 MG capsule, Take 200 mg by mouth 3 (three) times daily., Disp: , Rfl:    glucose blood (QUINTET AC BLOOD GLUCOSE TEST) test strip, Use as instructed, Disp: 100 each, Rfl: 12   GOCOVRI 137 MG CP24, Take by mouth., Disp: , Rfl:     Hypromellose (ARTIFICIAL TEARS OP), Place 1 drop into both eyes 4 (four) times daily as needed (for dry eyes). , Disp: , Rfl:    insulin aspart (NOVOLOG) 100 UNIT/ML injection, Inject 0-15 Units into the skin 3 (three) times daily with meals., Disp: 10 mL, Rfl: 11   ketoconazole (NIZORAL) 2 % cream, Apply 1 application topically 2 (two) times daily., Disp: 30 g, Rfl: 1   loratadine (CLARITIN) 10 MG tablet, Take 10 mg by mouth daily., Disp: , Rfl:    metFORMIN (GLUCOPHAGE) 500 MG tablet, TAKE 1 TABLET BY MOUTH 2 TIMES DAILY WITH A MEAL., Disp: 180 tablet, Rfl: 1   mirabegron ER (MYRBETRIQ) 25 MG TB24 tablet, Take 1 tablet (25 mg total) by mouth daily. (Patient not taking: Reported on 01/20/2021), Disp: 28 tablet, Rfl: 0   Multiple Vitamin (MULTIVITAMIN WITH MINERALS) TABS tablet, Take 1 tablet by mouth daily., Disp: , Rfl:    NOURIANZ 20 MG TABS, Take 1 tablet by mouth at bedtime. (Patient not taking: Reported on 01/20/2021), Disp: , Rfl:    polyethylene glycol (MIRALAX / GLYCOLAX) packet, Take 17 g by mouth daily as needed. , Disp: , Rfl:    pramipexole (MIRAPEX) 0.5 MG tablet, Take 2 tablets (1 mg total) by mouth at bedtime. (Patient not taking: Reported on 01/20/2021), Disp: 30 tablet, Rfl: 0   QUEtiapine (  SEROQUEL) 25 MG tablet, Take 25 mg by mouth at bedtime., Disp: , Rfl:    RYTARY 61.25-245 MG CPCR, Take 1 capsule by mouth 4 (four) times daily., Disp: , Rfl:    sertraline (ZOLOFT) 25 MG tablet, Take 25 mg by mouth daily. (Patient not taking: Reported on 01/20/2021), Disp: , Rfl:    vitamin B-12 (CYANOCOBALAMIN) 1000 MCG tablet, Take 1,000 mcg by mouth daily., Disp: , Rfl:    vitamin C (ASCORBIC ACID) 500 MG tablet, Take 1,000 mg by mouth daily. , Disp: , Rfl:    vitamin E 180 MG (400 UNITS) capsule, Take 400 Units by mouth daily. , Disp: , Rfl:    Medications ordered in this encounter:  Meds ordered this encounter  Medications   predniSONE (STERAPRED UNI-PAK 21 TAB) 10 MG (21) TBPK tablet     Sig: Take by mouth daily. Take 6 tabs by mouth daily  for 2 days, then 5 tabs for 2 days, then 4 tabs for 2 days, then 3 tabs for 2 days, 2 tabs for 2 days, then 1 tab by mouth daily for 2 days    Dispense:  42 tablet    Refill:  0    Order Specific Question:   Supervising Provider    Answer:   Sabra Heck, BRIAN [[id number]]   doxycycline (VIBRA-TABS) 100 MG tablet    Sig: Take 1 tablet (100 mg total) by mouth 2 (two) times daily for 10 days.    Dispense:  20 tablet    Refill:  0    Order Specific Question:   Supervising Provider    Answer:   Sabra Heck, BRIAN [[id number]]     *If you need refills on other medications prior to your next appointment, please contact your pharmacy*  Follow-Up: Call back or seek an in-person evaluation if the symptoms worsen or if the condition fails to improve as anticipated.  Other Instructions Get rest, and drink fluids You may use OTC allergy medication and flonase to help with congestion  Prednisone pak prescribed.  Take as directed and to completion Doxycycline prescribed.  Please begin taking this medication in 2-3 days if symptoms do not improve with steroid Use throat lozenges, hot tea, honey, and avoid second-hand smoke  Follow up in person at urgent care or the ED if symptoms worsen or do not improve with above medications Call 911 or go the the ED if you have chest pain, shortness of breath, difficulty breathing, dizziness, feeling faint, weakness, etc...   If you have been instructed to have an in-person evaluation today at a local Urgent Care facility, please use the link below. It will take you to a list of all of our available [facility], including address, phone number and hours of operation. Please do not delay care.  [facility]  If you or a family member do not have a primary care provider, use the link below to schedule a visit and establish care. When you choose a [facility] primary care physician or advanced practice  provider, you gain a long-term partner in health. Find a Primary Care Provider  Learn more about [facility] in-office and virtual care options: [facility]

## 2021-04-18 NOTE — Telephone Encounter (Signed)
Reason for Disposition  [1] Overly worried caller AND [2] second call within 4 hours about the same medical problem  Answer Assessment - Initial Assessment Questions 1. REASON FOR CALL or QUESTION: "What is your reason for calling today?" or "How can I best help you?" or "What question do you have that I can help answer?"     Patient's son would like medication prescribed to treat symptoms or call from PCP of which OTC medications patient can take due to hx Parkinson's Disease.  Answer Assessment - Initial Assessment Questions 1. SITUATION:  Document reason for call.     Patient's son upset he has not been contacted by PCP regarding patient staus and prescribing medication or appt today . Son also angry he has not been told what medications patient can take for symptoms due to hx Parkinson's Disease.  2. BACKGROUND: Document any background information (e.g., prior calls, known psychiatric history)     na 3. ASSESSMENT: Document your nursing assessment.     na 4. RESPONSE: Document what your response or recommendation was.     Recommendations call pharmacists for suggestions of safe OTC medications to treat symptoms. Encouraged son to take patient to ED if symptoms worsen. , keep hydrated and used humidifier for symptoms . Reinforced to try televisit for patient. Patient' son demanding a call back for practice.  Protocols used: Information Only Call - No Triage-A-AH, Difficult Call-A-AH

## 2021-04-18 NOTE — Telephone Encounter (Signed)
Patient's son returned call and wanted to voice his concerns that a message was left at approx. 5 pm for recommendations of OTC medications . Patient's son is upset and would like for PCP to call him Monday morning if possible to allow son to review requested medications . Son was able to complete televisit for patient and prescriptions were written, Flonase, doxycyline, and prednisone. Patient's son apologetic for earlier reactions to getting assistance for his father. Please call patient's son Monday. Patient's son aware if patient's symptoms worsen go to ED.

## 2021-04-25 DIAGNOSIS — F419 Anxiety disorder, unspecified: Secondary | ICD-10-CM | POA: Diagnosis not present

## 2021-04-25 DIAGNOSIS — R1310 Dysphagia, unspecified: Secondary | ICD-10-CM | POA: Diagnosis not present

## 2021-04-25 DIAGNOSIS — G20 Parkinson's disease: Secondary | ICD-10-CM | POA: Diagnosis not present

## 2021-04-25 DIAGNOSIS — M48061 Spinal stenosis, lumbar region without neurogenic claudication: Secondary | ICD-10-CM | POA: Diagnosis not present

## 2021-04-25 DIAGNOSIS — M1990 Unspecified osteoarthritis, unspecified site: Secondary | ICD-10-CM | POA: Diagnosis not present

## 2021-04-25 DIAGNOSIS — I10 Essential (primary) hypertension: Secondary | ICD-10-CM | POA: Diagnosis not present

## 2021-04-25 DIAGNOSIS — N529 Male erectile dysfunction, unspecified: Secondary | ICD-10-CM | POA: Diagnosis not present

## 2021-04-25 DIAGNOSIS — E785 Hyperlipidemia, unspecified: Secondary | ICD-10-CM | POA: Diagnosis not present

## 2021-04-25 DIAGNOSIS — Z9181 History of falling: Secondary | ICD-10-CM | POA: Diagnosis not present

## 2021-04-25 DIAGNOSIS — F0280 Dementia in other diseases classified elsewhere without behavioral disturbance: Secondary | ICD-10-CM | POA: Diagnosis not present

## 2021-04-25 DIAGNOSIS — Z993 Dependence on wheelchair: Secondary | ICD-10-CM | POA: Diagnosis not present

## 2021-04-25 DIAGNOSIS — F32A Depression, unspecified: Secondary | ICD-10-CM | POA: Diagnosis not present

## 2021-04-25 DIAGNOSIS — Z791 Long term (current) use of non-steroidal anti-inflammatories (NSAID): Secondary | ICD-10-CM | POA: Diagnosis not present

## 2021-04-25 DIAGNOSIS — G2581 Restless legs syndrome: Secondary | ICD-10-CM | POA: Diagnosis not present

## 2021-05-05 ENCOUNTER — Ambulatory Visit: Payer: PPO | Admitting: Dermatology

## 2021-05-05 ENCOUNTER — Other Ambulatory Visit: Payer: Self-pay

## 2021-05-05 DIAGNOSIS — L2089 Other atopic dermatitis: Secondary | ICD-10-CM

## 2021-05-05 DIAGNOSIS — L570 Actinic keratosis: Secondary | ICD-10-CM | POA: Diagnosis not present

## 2021-05-05 DIAGNOSIS — L821 Other seborrheic keratosis: Secondary | ICD-10-CM

## 2021-05-05 DIAGNOSIS — L578 Other skin changes due to chronic exposure to nonionizing radiation: Secondary | ICD-10-CM

## 2021-05-05 DIAGNOSIS — L219 Seborrheic dermatitis, unspecified: Secondary | ICD-10-CM

## 2021-05-05 DIAGNOSIS — L814 Other melanin hyperpigmentation: Secondary | ICD-10-CM

## 2021-05-05 DIAGNOSIS — L820 Inflamed seborrheic keratosis: Secondary | ICD-10-CM | POA: Diagnosis not present

## 2021-05-05 MED ORDER — KETOCONAZOLE 2 % EX CREA: TOPICAL_CREAM | CUTANEOUS | 6 refills | Status: DC

## 2021-05-05 MED ORDER — MOMETASONE FUROATE 0.1 % EX CREA: TOPICAL_CREAM | CUTANEOUS | 1 refills | Status: DC

## 2021-05-05 MED ORDER — HYDROCORTISONE 2.5 % EX LOTN: TOPICAL_LOTION | CUTANEOUS | 6 refills | Status: DC

## 2021-05-05 NOTE — Progress Notes (Addendum)
Follow-Up Visit   Subjective  Roger Rodriguez is a 80 y.o. male who presents for the following: seborrheic dermatitis (Of the face - patient was using HC 2.5% lotion which helped, but he has since ran out and needs refills. ), rash  (On the neck - patient and care giver are concerned and would like to discuss treatment options.), and irritated skin lesions (On the arms - crusted and scabbed, patient is concerned and would like them checked.).  The following portions of the chart were reviewed this encounter and updated as appropriate:   Tobacco  Allergies  Meds  Problems  Med Hx  Surg Hx  Fam Hx     Review of Systems:  No other skin or systemic complaints except as noted in HPI or Assessment and Plan.  Objective  Well appearing patient in no apparent distress; mood and affect are within normal limits.  A focused examination was performed including the face, neck, and extremities. Relevant physical exam findings are noted in the Assessment and Plan.  Mid face, eyebrows, beard area Pink patches with greasy scale.   R nose x 1, L cheek x 1, forehead x 2, scalp x 5 (9) Erythematous thin papules/macules with gritty scale.   L forearm x 1, L bicep x 1 (2) Erythematous keratotic or waxy stuck-on papule or plaque.   Neck Scaly erythematous papules and patches +/- dyspigmentation, lichenification, excoriations.    Assessment & Plan  Seborrheic dermatitis Mid face, eyebrows, beard area  Seborrheic Dermatitis  -  is a chronic persistent rash characterized by pinkness and scaling most commonly of the mid face but also can occur on the scalp (dandruff), ears; mid chest, mid back and groin.  It tends to be exacerbated by stress and cooler weather.  People who have neurologic disease may experience new onset or exacerbation of existing seborrheic dermatitis.  The condition is not curable but treatable and can be controlled.  Start Ketoconazole 2% cream QHS on Monday, Wednesday, and  Friday. On Tuesday, Thursday, and Saturday use HC 2.5% lotion QHS.   ketoconazole (NIZORAL) 2 % cream - Mid face, eyebrows, beard area Apply to the eyebrows, mid face, and beard area QHS on Monday, Wednesday, and Friday.  hydrocortisone 2.5 % lotion - Mid face, eyebrows, beard area Apply to the eyebrows, mid face, and beard area QHS on Tuesday, Thursday, and Saturday.  AK (actinic keratosis) (9) R nose x 1, L cheek x 1, forehead x 2, scalp x 5  Destruction of lesion - R nose x 1, L cheek x 1, forehead x 2, scalp x 5 Complexity: simple   Destruction method: cryotherapy   Informed consent: discussed and consent obtained   Timeout:  patient name, date of birth, surgical site, and procedure verified Lesion destroyed using liquid nitrogen: Yes   Region frozen until ice ball extended beyond lesion: Yes   Outcome: patient tolerated procedure well with no complications   Post-procedure details: wound care instructions given    Inflamed seborrheic keratosis L forearm x 1, L bicep x 1  Destruction of lesion - L forearm x 1, L bicep x 1 Complexity: simple   Destruction method: cryotherapy   Informed consent: discussed and consent obtained   Timeout:  patient name, date of birth, surgical site, and procedure verified Lesion destroyed using liquid nitrogen: Yes   Region frozen until ice ball extended beyond lesion: Yes   Outcome: patient tolerated procedure well with no complications   Post-procedure details: wound care  instructions given    Other atopic dermatitis Neck  Atopic dermatitis (eczema) is a chronic, relapsing, pruritic condition that can significantly affect quality of life. It is often associated with allergic rhinitis and/or asthma and can require treatment with topical medications, phototherapy, or in severe cases biologic injectable medication (Dupixent; Adbry) or Oral JAK inhibitors.  Start Mometasone 0.1% cream to aa's BID PRN. Topical steroids (such as triamcinolone,  fluocinolone, fluocinonide, mometasone, clobetasol, halobetasol, betamethasone, hydrocortisone) can cause thinning and lightening of the skin if they are used for too long in the same area. Your physician has selected the right strength medicine for your problem and area affected on the body. Please use your medication only as directed by your physician to prevent side effects.    mometasone (ELOCON) 0.1 % cream - Neck Apply to scaly rash area on neck once to twice daily as needed. Avoid the face, groin, and axilla.  Seborrheic Keratoses - Stuck-on, waxy, tan-brown papules and/or plaques  - Benign-appearing - Discussed benign etiology and prognosis. - Observe - Call for any changes  Actinic Damage - chronic, secondary to cumulative UV radiation exposure/sun exposure over time - diffuse scaly erythematous macules with underlying dyspigmentation - Recommend daily broad spectrum sunscreen SPF 30+ to sun-exposed areas, reapply every 2 hours as needed.  - Recommend staying in the shade or wearing long sleeves, sun glasses (UVA+UVB protection) and wide brim hats (4-inch brim around the entire circumference of the hat). - Call for new or changing lesions.  Lentigines - Scattered tan macules - Due to sun exposure - Benign-appering, observe - Recommend daily broad spectrum sunscreen SPF 30+ to sun-exposed areas, reapply every 2 hours as needed. - Call for any changes  Return in about 3 months (around 08/05/2021).  Luther Redo, CMA, am acting as scribe for Sarina Ser, MD . Documentation: I have reviewed the above documentation for accuracy and completeness, and I agree with the above.  Sarina Ser, MD

## 2021-05-05 NOTE — Patient Instructions (Addendum)
Start Ketoconazole 2% cream every night on Monday, Wednesday, and Friday to the eyebrows, mid face, and beard area. Continue Hydrocortisone 2.5% lotion to the mid face, eyebrows, and beard area every night on Tuesday, Thursday, and Saturday.   Start Mometasone 0.1% cream to scaly rash on the neck once to twice daily as needed. Topical steroids (such as triamcinolone, fluocinolone, fluocinonide, mometasone, clobetasol, halobetasol, betamethasone, hydrocortisone) can cause thinning and lightening of the skin if they are used for too long in the same area. Your physician has selected the right strength medicine for your problem and area affected on the body. Please use your medication only as directed by your physician to prevent side effects.    If you have any questions or concerns for your doctor, please call our main line at [phone] and press option 4 to reach your doctor's medical assistant. If no one answers, please leave a voicemail as directed and we will return your call as soon as possible. Messages left after 4 pm will be answered the following business day.   You may also send [place] a message via [place]. We typically respond to MyChart messages within 1-2 business days.  For prescription refills, please ask your pharmacy to contact our office. Our fax number is [phone].  If you have an urgent issue when the clinic is closed that cannot wait until the next business day, you can page your doctor at the number below.    Please note that while we do our best to be available for urgent issues outside of office hours, we are not available 24/7.   If you have an urgent issue and are unable to reach [place], you may choose to seek medical care at your doctor's office, retail clinic, urgent care center, or emergency room.  If you have a medical emergency, please immediately call 911 or go to the emergency department.  Pager Numbers  - Dr. Nehemiah Massed: [phone]  - Dr. Laurence Ferrari:  [phone]  - Dr. Nicole Kindred: [phone]  In the event of inclement weather, please call our main line at [phone] for an update on the status of any delays or closures.  Dermatology Medication Tips: Please keep the boxes that topical medications come in in order to help keep track of the instructions about where and how to use these. Pharmacies typically print the medication instructions only on the boxes and not directly on the medication tubes.   If your medication is too expensive, please contact our office at [phone] option 4 or send [place] a message through [place].   We are unable to tell what your co-pay for medications will be in advance as this is different depending on your insurance coverage. However, we may be able to find a substitute medication at lower cost or fill out paperwork to get insurance to cover a needed medication.   If a prior authorization is required to get your medication covered by your insurance company, please allow [place] 1-2 business days to complete this process.  Drug prices often vary depending on where the prescription is filled and some pharmacies may offer cheaper prices.  The website www.goodrx.com contains coupons for medications through different pharmacies. The prices here do not account for what the cost may be with help from insurance (it may be cheaper with your insurance), but the website can give you the price if you did not use any insurance.  - You can print the associated coupon and take it with your prescription to the pharmacy.  -  You may also stop by our office during regular business hours and pick up a GoodRx coupon card.  - If you need your prescription sent electronically to a different pharmacy, notify our office through [facility] or by phone at [phone] option 4.

## 2021-05-06 ENCOUNTER — Encounter: Payer: Self-pay | Admitting: Dermatology

## 2021-05-08 ENCOUNTER — Telehealth: Payer: Self-pay | Admitting: Family Medicine

## 2021-05-08 NOTE — Telephone Encounter (Signed)
Deanna called about orders that were faxed and never received back / please advise   Home Health Verbal Orders - Caller/Agency: Deanna Jackquline [place]  Callback Number: [phone] Requesting Plan of care order/ 485 order Frequency: for 60 day order

## 2021-05-13 NOTE — Progress Notes (Signed)
Suprapubic Cath Change  He has been experiencing burning in the penis off and on for the last week.  He also noticed that his urine is starting to look cloudy.  Patient denies any modifying or aggravating factors.  Patient denies any gross hematuria, dysuria or suprapubic/flank pain.  Patient denies any fevers, chills, nausea or vomiting.    UA nitrate positive, >30 WBC's, > 30 RBC's and few bacteria.   Patient is present today for a suprapubic catheter change due to urinary retention.  9 ml of water was drained from the balloon, a 16 FR foley cath was removed from the tract with out difficulty.  Site was cleaned and prepped in a sterile fashion with betadine.  A 16 FR foley cath was replaced into the tract no complications were noted. Urine return was noted, 10 ml of sterile water was inflated into the balloon and a leg bag was attached for drainage.  Patient tolerated well. A night bag was given to patient and proper instruction was given on how to switch bags.    Performed by: Zara Council, PA-C and Crysta S. Albright, CMA   Follow up: One month for SPT exchange

## 2021-05-14 ENCOUNTER — Other Ambulatory Visit: Payer: Self-pay | Admitting: Family Medicine

## 2021-05-14 ENCOUNTER — Ambulatory Visit: Payer: PPO | Admitting: Urology

## 2021-05-14 ENCOUNTER — Other Ambulatory Visit: Payer: Self-pay

## 2021-05-14 DIAGNOSIS — R300 Dysuria: Secondary | ICD-10-CM | POA: Diagnosis not present

## 2021-05-14 DIAGNOSIS — Z9359 Other cystostomy status: Secondary | ICD-10-CM | POA: Diagnosis not present

## 2021-05-14 LAB — URINALYSIS, COMPLETE
Bilirubin, UA: NEGATIVE
Glucose, UA: NEGATIVE
Nitrite, UA: POSITIVE — AB
Specific Gravity, UA: 1.025 (ref 1.005–1.030)
Urobilinogen, Ur: 1 mg/dL (ref 0.2–1.0)
pH, UA: 5.5 (ref 5.0–7.5)

## 2021-05-14 LAB — MICROSCOPIC EXAMINATION
RBC, Urine: >30 /hpf — ABNORMAL HIGH (ref 0–2)
WBC, UA: >30 /hpf — ABNORMAL HIGH (ref 0–5)

## 2021-05-14 MED ORDER — NITROFURANTOIN MONOHYD MACRO 100 MG PO CAPS: 100.0000 mg | ORAL_CAPSULE | Freq: Two times a day (BID) | ORAL | 0 refills | Status: DC

## 2021-05-14 NOTE — Telephone Encounter (Signed)
Requested Prescriptions  Pending Prescriptions Disp Refills  . celecoxib (CELEBREX) 100 MG capsule [Pharmacy Med Name: CELECOXIB 100 MG CAPSULE] 60 capsule 2    Sig: TAKE 1 CAPSULE BY MOUTH TWICE A DAY     Analgesics:  COX2 Inhibitors Passed - 05/14/2021  1:19 AM      Passed - HGB in normal range and within 360 days    Hemoglobin  Date Value Ref Range Status  12/31/2020 14.8 13.0 - 17.7 g/dL Final         Passed - Cr in normal range and within 360 days    Creatinine  Date Value Ref Range Status  04/06/2014 0.94 0.60 - 1.30 mg/dL Final   Creatinine, Ser  Date Value Ref Range Status  12/31/2020 0.85 0.76 - 1.27 mg/dL Final         Passed - Patient is not pregnant      Passed - Valid encounter within last 12 months    Recent Outpatient Visits          4 months ago Idiopathic Parkinson's disease [facility])   [place], Richard L Jr., MD   1 year ago Idiopathic Parkinson's disease [facility])   [facility] [name]., MD   2 years ago Diabetes mellitus without complication [facility])   [facility] [name]., MD   2 years ago Pre-op examination   [facility] [name]., MD   3 years ago Encounter for annual physical exam   [facility] [name]., MD      Future Appointments            Today Ernestine Conrad, [place] Payment [facility]   In 2 weeks [name]., MD [facility], [place]   In 3 months Ralene Bathe, MD [place]

## 2021-05-18 LAB — CULTURE, URINE COMPREHENSIVE

## 2021-05-18 NOTE — Addendum Note (Signed)
Addended by: Ralene Bathe on: 05/18/2021 07:38 PM   Modules accepted: Level of Service

## 2021-05-22 ENCOUNTER — Telehealth: Payer: Self-pay | Admitting: Urology

## 2021-05-22 NOTE — Telephone Encounter (Signed)
Patient was seen on 05/14/21 for a UTI.  He called to say that he wasn't better  and was wondering if he needed another antibiotic

## 2021-05-22 NOTE — Telephone Encounter (Signed)
Spoke with patient and he states his urine is cloudy and he's having the same symptoms as before, but he couldn't tell me what they where except cloudy, he also states it has some sediment in his urine.

## 2021-05-23 ENCOUNTER — Other Ambulatory Visit: Payer: Self-pay | Admitting: Family Medicine

## 2021-05-23 ENCOUNTER — Telehealth: Payer: Self-pay | Admitting: Urology

## 2021-05-23 DIAGNOSIS — E119 Type 2 diabetes mellitus without complications: Secondary | ICD-10-CM

## 2021-05-23 NOTE — Telephone Encounter (Signed)
Requested medications are due for refill today yes  Requested medications are on the active medication list yes  Last refill 04/25/21 (90 day supply)  Last visit 12/31/20  Future visit scheduled 05/28/21  Notes to clinic requesting early, filled 90 day supply 04/25/21, please assess.  Requested Prescriptions  Pending Prescriptions Disp Refills   metFORMIN (GLUCOPHAGE) 500 MG tablet [Pharmacy Med Name: METFORMIN HCL 500 MG TABLET] 180 tablet 1    Sig: TAKE 1 TABLET BY MOUTH 2 TIMES DAILY WITH A MEAL.     Endocrinology:  Diabetes - Biguanides Passed - 05/23/2021  2:42 AM      Passed - Cr in normal range and within 360 days    Creatinine  Date Value Ref Range Status  04/06/2014 0.94 0.60 - 1.30 mg/dL Final   Creatinine, Ser  Date Value Ref Range Status  12/31/2020 0.85 0.76 - 1.27 mg/dL Final          Passed - HBA1C is between 0 and 7.9 and within 180 days    Hgb A1c MFr Bld  Date Value Ref Range Status  12/31/2020 7.4 (H) 4.8 - 5.6 % Final    Comment:             Prediabetes: 5.7 - 6.4          Diabetes: >6.4          Glycemic control for adults with diabetes: <7.0           Passed - eGFR in normal range and within 360 days    EGFR (African American)  Date Value Ref Range Status  04/06/2014 >60 >62m/min Final  04/04/2014 >60  Final   GFR calc Af Amer  Date Value Ref Range Status  08/15/2018 78 >59 mL/min/1.73 Final   EGFR (Non-African Amer.)  Date Value Ref Range Status  04/06/2014 >60 >663mmin Final    Comment:    eGFR values <6053min/1.73 m2 may be an indication of chronic kidney disease (CKD). Calculated eGFR, using the MRDR Study equation, is useful in  patients with stable renal function. The eGFR calculation will not be reliable in acutely ill patients when serum creatinine is changing rapidly. It is not useful in patients on dialysis. The eGFR calculation may not be applicable to patients at the low and high extremes of body sizes,  pregnant women, and vegetarians.   04/04/2014 54 (L)  Final    Comment:    eGFR values <74m66mn/1.73 m2 may be an indication of chronic kidney disease (CKD). Calculated eGFR is useful in patients with stable renal function. The eGFR calculation will not be reliable in acutely ill patients when serum creatinine is changing rapidly. It is not useful in  patients on dialysis. The eGFR calculation may not be applicable to patients at the low and high extremes of body sizes, pregnant women, and vegetarians.    GFR calc non Af Amer  Date Value Ref Range Status  08/15/2018 68 >59 mL/min/1.73 Final   eGFR  Date Value Ref Range Status  12/31/2020 88 >59 mL/min/1.73 Final          Passed - Valid encounter within last 6 months    Recent Outpatient Visits           4 months [facility]   [facility]   2 years ago Diabetes mellitus  without complication [facility])   [facility] [name]., MD   2 years ago Pre-op examination   [facility] [name]., MD   3 years ago Encounter for annual physical exam   [facility] [name]., MD       Future Appointments             In 5 days [name]., MD [facility], [place]   In 3 weeks Ernestine Conrad, [place] Payment [facility]   In 2 months Ralene Bathe, MD [place]

## 2021-05-23 NOTE — Telephone Encounter (Signed)
Spoke with patient and he will have his son bring in a sample.

## 2021-05-23 NOTE — Telephone Encounter (Signed)
Patient called in and would like a call back in regards to a prescription. He was told it would be called in yesterday and ready for pick-up this morning and the pharmacy does not have it. He said it should be called into the [facility] pharmacy on [address]. He would like a call back today.

## 2021-05-23 NOTE — Telephone Encounter (Signed)
As per shannons previous message patient can not get a refill on medication with out being seen. I did try to call patient. Unable to reach patient. If patient calls back. He would need to come into clinic to provide [place] with a urine sample. Please see previous telephone encounter from 05/22/2021.

## 2021-05-26 ENCOUNTER — Other Ambulatory Visit: Payer: Self-pay

## 2021-05-26 DIAGNOSIS — R300 Dysuria: Secondary | ICD-10-CM

## 2021-05-27 ENCOUNTER — Other Ambulatory Visit: Payer: PPO

## 2021-05-27 ENCOUNTER — Encounter: Payer: Self-pay | Admitting: Urology

## 2021-05-28 ENCOUNTER — Ambulatory Visit (INDEPENDENT_AMBULATORY_CARE_PROVIDER_SITE_OTHER): Payer: PPO | Admitting: Family Medicine

## 2021-05-28 ENCOUNTER — Other Ambulatory Visit: Payer: PPO

## 2021-05-28 ENCOUNTER — Other Ambulatory Visit: Payer: Self-pay

## 2021-05-28 ENCOUNTER — Encounter: Payer: Self-pay | Admitting: Family Medicine

## 2021-05-28 VITALS — BP 123/74 | HR 88 | Temp 98.2°F | Ht 71.0 in

## 2021-05-28 DIAGNOSIS — G2581 Restless legs syndrome: Secondary | ICD-10-CM | POA: Diagnosis not present

## 2021-05-28 DIAGNOSIS — E7800 Pure hypercholesterolemia, unspecified: Secondary | ICD-10-CM | POA: Diagnosis not present

## 2021-05-28 DIAGNOSIS — G20 Parkinson's disease: Secondary | ICD-10-CM

## 2021-05-28 DIAGNOSIS — T8450XD Infection and inflammatory reaction due to unspecified internal joint prosthesis, subsequent encounter: Secondary | ICD-10-CM | POA: Diagnosis not present

## 2021-05-28 DIAGNOSIS — F321 Major depressive disorder, single episode, moderate: Secondary | ICD-10-CM | POA: Diagnosis not present

## 2021-05-28 DIAGNOSIS — K760 Fatty (change of) liver, not elsewhere classified: Secondary | ICD-10-CM | POA: Diagnosis not present

## 2021-05-28 DIAGNOSIS — I10 Essential (primary) hypertension: Secondary | ICD-10-CM | POA: Diagnosis not present

## 2021-05-28 DIAGNOSIS — R300 Dysuria: Secondary | ICD-10-CM

## 2021-05-28 DIAGNOSIS — E119 Type 2 diabetes mellitus without complications: Secondary | ICD-10-CM

## 2021-05-28 DIAGNOSIS — Z9359 Other cystostomy status: Secondary | ICD-10-CM | POA: Diagnosis not present

## 2021-05-28 LAB — MICROSCOPIC EXAMINATION: WBC, UA: >30 /hpf — AB (ref 0–5)

## 2021-05-28 LAB — URINALYSIS, COMPLETE
Bilirubin, UA: NEGATIVE
Glucose, UA: NEGATIVE
Nitrite, UA: POSITIVE — AB
Specific Gravity, UA: 1.02 (ref 1.005–1.030)
Urobilinogen, Ur: 1 mg/dL (ref 0.2–1.0)
pH, UA: 7.5 (ref 5.0–7.5)

## 2021-05-28 LAB — POCT GLYCOSYLATED HEMOGLOBIN (HGB A1C)
Est. average glucose Bld gHb Est-mCnc: 157
Hemoglobin A1C: 7.1 % — AB (ref 4.0–5.6)

## 2021-05-28 NOTE — Progress Notes (Signed)
I,April Miller,acting as a scribe for Wilhemena Durie, MD.,have documented all relevant documentation on the behalf of Wilhemena Durie, MD,as directed by  Wilhemena Durie, MD while in the presence of Wilhemena Durie, MD.   Established patient visit   Patient: Roger Rodriguez   DOB: Dec 27, 1940   80 y.o. Male  MRN: [id number] Visit Date: 05/28/2021  Today's healthcare provider: Wilhemena Durie, MD   Chief Complaint  Patient presents with   Follow-up   Diabetes   Subjective    HPI  Patient has around-the-clock caregiver who brings him in today.  He needs a new air mattress and this is a face-to-face visit for this and for a stand to lift stand.  Evidently the stand to lift stand is to help him with transfers for his day-to-day care.  He is unable to ambulate at all.  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 Diabetes Mellitus Type II, follow-up  Lab Results  Component Value Date   HGBA1C 7.1 (A) 05/28/2021   HGBA1C 7.4 (H) 12/31/2020   HGBA1C 7.3 (H) 06/02/2018   Last seen for diabetes 5 months ago.  Management since then includes continuing the same treatment. He reports good compliance with treatment. He is not having side effects. none  Home blood sugar records: fasting range: 115  Episodes of hypoglycemia? No none   Current insulin regiment: n/a Most Recent Eye Exam: due  --------------------------------------------------------------------------------------------------- Hypertension, follow-up  BP Readings from Last 3 Encounters:  05/28/21 123/74  12/31/20 110/72  11/18/20 118/72   Wt Readings from Last 3 Encounters:  04/11/20 185 lb (83.9 kg)  03/11/20 170 lb (77.1 kg)  12/27/19 170 lb (77.1 kg)     He was last seen for hypertension 5 months ago.  BP at that visit was 110/72. Management since that visit includes; Good control. He reports good compliance with  treatment. He is not having side effects. none He is not exercising. He is adherent to low salt diet.   Outside blood pressures are 120/70.  He does smoke.  Use of agents associated with hypertension: none.   --------------------------------------------------------------------------------------------------- Depression, Follow-up  He  was last seen for this 5 months ago. Changes made at last visit include; On sertraline and Seroquel.   He reports good compliance with treatment. He is not having side effects. none  He reports fair tolerance of treatment. Current symptoms include: depressed mood He feels he is Worse since last visit.  Depression screen [facility] 2/9 05/28/2021 11/15/2019 09/01/2018  Decreased Interest 3 0 1  Down, Depressed, Hopeless 3 1 1   PHQ - 2 Score 6 1 2   Altered sleeping 3 - 1  Tired, decreased energy 3 - 1  Change in appetite 3 - 0  Feeling bad or failure about yourself  3 - 0  Trouble concentrating 3 - 1  Moving slowly or fidgety/restless 3 - 0  Suicidal thoughts 0 - 0  PHQ-9 Score 24 - 5  Difficult doing work/chores Extremely dIfficult - Somewhat difficult  Some recent data might be hidden    -----------------------------------------------------------------------------------------      Medications: Outpatient Medications Prior to Visit  Medication Sig   amantadine (SYMMETREL) 100 MG capsule Take 100 mg by mouth daily as needed.   budesonide (RHINOCORT AQUA) 32 MCG/ACT nasal spray USE 1 PUFF IN EACH NOSTRIL EVERY DAY AS NEEDED (Patient taking differently: Place 1 spray into both nostrils  daily as needed for allergies.)   busPIRone (BUSPAR) 7.5 MG tablet Take 7.5 mg by mouth 2 (two) times daily. To 3 times daily   celecoxib (CELEBREX) 100 MG capsule TAKE 1 CAPSULE BY MOUTH TWICE A DAY   cholecalciferol (VITAMIN D) 1000 units tablet Take 1,000 Units by mouth daily.    enalapril (VASOTEC) 2.5 MG tablet Take 1 tablet (2.5 mg total) by mouth 2 (two) times  daily.   gabapentin (NEURONTIN) 100 MG capsule Take 200 mg by mouth 3 (three) times daily.   hydrocortisone 2.5 % lotion Apply to the eyebrows, mid face, and beard area QHS on Tuesday, Thursday, and Saturday.   Hypromellose (ARTIFICIAL TEARS OP) Place 1 drop into both eyes 4 (four) times daily as needed (for dry eyes).    ketoconazole (NIZORAL) 2 % cream Apply 1 application topically 2 (two) times daily.   ketoconazole (NIZORAL) 2 % cream Apply to the eyebrows, mid face, and beard area QHS on Monday, Wednesday, and Friday.   loratadine (CLARITIN) 10 MG tablet Take 10 mg by mouth daily.   metFORMIN (GLUCOPHAGE) 500 MG tablet TAKE 1 TABLET BY MOUTH 2 TIMES DAILY WITH A MEAL.   mometasone (ELOCON) 0.1 % cream Apply to scaly rash area on neck once to twice daily as needed. Avoid the face, groin, and axilla.   Multiple Vitamin (MULTIVITAMIN WITH MINERALS) TABS tablet Take 1 tablet by mouth daily.   polyethylene glycol (MIRALAX / GLYCOLAX) packet Take 17 g by mouth daily as needed.    QUEtiapine (SEROQUEL) 25 MG tablet Take 25 mg by mouth at bedtime.   RYTARY 61.25-245 MG CPCR Take 1 capsule by mouth 4 (four) times daily.   sertraline (ZOLOFT) 25 MG tablet Take 25 mg by mouth daily.   vitamin B-12 (CYANOCOBALAMIN) 1000 MCG tablet Take 1,000 mcg by mouth daily.   vitamin C (ASCORBIC ACID) 500 MG tablet Take 1,000 mg by mouth daily.    vitamin E 180 MG (400 UNITS) capsule Take 400 Units by mouth daily.    glucose blood (QUINTET AC BLOOD GLUCOSE TEST) test strip Use as instructed (Patient not taking: Reported on 05/28/2021)   GOCOVRI 137 MG CP24 Take by mouth. (Patient not taking: Reported on 05/28/2021)   insulin aspart (NOVOLOG) 100 UNIT/ML injection Inject 0-15 Units into the skin 3 (three) times daily with meals. (Patient not taking: Reported on 05/28/2021)   mirabegron ER (MYRBETRIQ) 25 MG TB24 tablet Take 1 tablet (25 mg total) by mouth daily. (Patient not taking: No sig reported)   nitrofurantoin,  macrocrystal-monohydrate, (MACROBID) 100 MG capsule Take 1 capsule (100 mg total) by mouth every 12 (twelve) hours. (Patient not taking: Reported on 05/28/2021)   NOURIANZ 20 MG TABS Take 1 tablet by mouth at bedtime. (Patient not taking: No sig reported)   pramipexole (MIRAPEX) 0.5 MG tablet Take 2 tablets (1 mg total) by mouth at bedtime. (Patient not taking: No sig reported)   predniSONE (STERAPRED UNI-PAK 21 TAB) 10 MG (21) TBPK tablet Take by mouth daily. Take 6 tabs by mouth daily  for 2 days, then 5 tabs for 2 days, then 4 tabs for 2 days, then 3 tabs for 2 days, 2 tabs for 2 days, then 1 tab by mouth daily for 2 days (Patient not taking: Reported on 05/28/2021)   No facility-administered medications prior to visit.    Review of Systems  Constitutional:  Negative for appetite change, chills and fever.  Respiratory:  Negative for chest tightness, shortness of breath  and wheezing.   Cardiovascular:  Negative for chest pain and palpitations.  Gastrointestinal:  Negative for abdominal pain, nausea and vomiting.      Objective    BP 123/74 (BP Location: Right Arm, Patient Position: Sitting, Cuff Size: Normal)   Pulse 88   Temp 98.2 F (36.8 C) (Oral)   Ht 5\' 11"  (1.803 m)   SpO2 96%   BMI 25.80 kg/m  {Show previous vital signs (optional):23777}  Physical Exam Vitals reviewed.  Constitutional:      General: He is not in acute distress.    Appearance: He is well-developed.     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 Is very neatly groomed and dressed  HENT:     Head: Normocephalic and atraumatic.     Right Ear: External ear normal.     Left Ear: External ear normal.     Nose: Nose normal.  Eyes:     General: No scleral icterus.    Conjunctiva/sclera: Conjunctivae normal.  Neck:     Thyroid: No thyromegaly.  Cardiovascular:     Rate and Rhythm: Normal rate and regular rhythm.     Heart sounds: Normal heart sounds.  Pulmonary:     Effort:  Pulmonary effort is normal.     Breath sounds: Normal breath sounds.  Abdominal:     Palpations: Abdomen is soft.  Skin:    General: Skin is warm and dry.     Comments: Very fair skin.  Neurological:     Mental Status: He is alert and oriented to person, place, and time. Mental status is at baseline.     Motor: Abnormal muscle tone present.     Comments: Parkinsons stigmata.  Psychiatric:        Mood and Affect: Mood normal.        Behavior: Behavior normal.        Thought Content: Thought content normal.        Judgment: Judgment normal.      Results for orders placed or performed in visit on 05/28/21  POCT glycosylated hemoglobin (Hb A1C)  Result Value Ref Range   Hemoglobin A1C 7.1 (A) 4.0 - 5.6 %   Est. average glucose Bld gHb Est-mCnc 157     Assessment & Plan     1. Diabetes mellitus without complication [facility]) Patient with multiple severe medical problems with A1c 1.1 is good control metformin 500 mg twice a day - POCT glycosylated hemoglobin (Hb A1C)  2. Essential (primary) hypertension Controlled enalapril 2.5  3. Depression, major, single episode, moderate (HCC) Sertraline and Seroquel  4. Idiopathic Parkinson's disease ([place]) Treated by neurology.  Movement almost none Gets round-the-clock care in his own home.  Patient written for face-to-face for air mattress and stand to lift stand 5. Hepatic steatosis   6. Infection of prosthetic joint, subsequent encounter Unable to weight-bear  7. Parkinson's disease ([place])   8. Restless leg   9. Pure hypercholesterolemia   10. Suprapubic catheter [facility]) Followed by urology    Return in about 6 months (around 11/25/2021).      I, Wilhemena Durie, MD, have reviewed all documentation for this visit. The documentation on 05/31/21 for the exam, diagnosis, procedures, and orders are all accurate and complete.    Fannie Gathright Cranford Mon, MD  [facility] [phone] (phone) [phone]  (fax)  [place]

## 2021-05-31 LAB — CULTURE, URINE COMPREHENSIVE

## 2021-06-10 ENCOUNTER — Ambulatory Visit: Payer: Self-pay

## 2021-06-10 ENCOUNTER — Telehealth: Payer: Self-pay

## 2021-06-10 NOTE — Telephone Encounter (Signed)
Copied from [place] [id number]. Topic: Appointment Scheduling - Scheduling Inquiry for Clinic >> Jun 10, 2021  9:04 AM Tessa Lerner A wrote: Reason for CRM: [name] son has called to share that they spoke with a triage nurse yesterday evening and were told that staff would look into the patient being seen today 06/10/21 for an office visit   The patient has laryngitis and chest congestion but no fever  Please contact further when possible to advise scheduling

## 2021-06-10 NOTE — Telephone Encounter (Signed)
Patient and caregiver Roger Rodriguez called in patient was not able to speak loss of voice. Patient complains of sore throat, loss of voice same thing he had about 2 months ago needing to be seen son called in this morning but got tied up at work please advise Ph# [phone]     Called and spoke to caregiver Roger Rodriguez. I was given permission by pt to speak with her.    Pt has a sore throat and cough.   Care giver states that pt has no fever and O2 sats have not gone below 94%.  Care giver will be with pt all night and will continue to monitor. She will seek immediate medical care if needed.  Caregiver states that pt is in a wheel chair and is difficult to get him into the office. No appointments available for tomorrow.  PT and care giver would like medication sent to pharmacy. Pt was given a steroid and antibiotics which were very helpful last time when pt had a similar issue.  Please advise.  Answer Assessment - Initial Assessment Questions 1. ONSET: "When did the nasal discharge start?"      Friday or saturday 2. AMOUNT: "How much discharge is there?"      Cough 3. COUGH: "Do you have a cough?" If yes, ask: "Describe the color of your sputum" (clear, white, yellow, green)     yes 4. RESPIRATORY DISTRESS: "Describe your breathing."      Ok. 5. FEVER: "Do you have a fever?" If Yes, ask: "What is your temperature, how was it measured, and when did it start?"     no 6. SEVERITY: "Overall, how bad are you feeling right now?" (e.g., doesn't interfere with normal activities, staying home from school/work, staying in bed)      poorly 7. OTHER SYMPTOMS: "Do you have any other symptoms?" (e.g., sore throat, earache, wheezing, vomiting)     Sore throat 8. PREGNANCY: "Is there any chance you are pregnant?" "When was your last menstrual period?"     na  Protocols used: Common Cold-A-AH

## 2021-06-11 NOTE — Telephone Encounter (Signed)
LMOVM for patient's son to return call. Okay for pec to advise UC or telemed. Dr. Rosanna Randy has no more appt available.

## 2021-06-11 NOTE — Telephone Encounter (Signed)
See other phone note

## 2021-06-11 NOTE — Telephone Encounter (Signed)
Please advise 

## 2021-06-12 ENCOUNTER — Telehealth: Payer: PPO | Admitting: Emergency Medicine

## 2021-06-12 DIAGNOSIS — B9689 Other specified bacterial agents as the cause of diseases classified elsewhere: Secondary | ICD-10-CM | POA: Diagnosis not present

## 2021-06-12 DIAGNOSIS — J208 Acute bronchitis due to other specified organisms: Secondary | ICD-10-CM

## 2021-06-12 MED ORDER — AZITHROMYCIN 250 MG PO TABS: ORAL_TABLET | ORAL | 0 refills | Status: AC

## 2021-06-12 MED ORDER — PREDNISONE 20 MG PO TABS: 20.0000 mg | ORAL_TABLET | Freq: Two times a day (BID) | ORAL | 0 refills | Status: AC

## 2021-06-12 NOTE — Patient Instructions (Signed)
Roger Rodriguez, thank you for joining [place], PA-C for today's virtual visit.  While this provider is not your primary care provider (PCP), if your PCP is located in our provider database this encounter information will be shared with them immediately following your visit.  Consent: (Patient) Roger Rodriguez provided verbal consent for this virtual visit at the beginning of the encounter.  Current Medications:  Current Outpatient Medications:    carbidopa-levodopa (SINEMET CR) 50-200 MG tablet, Take 1 tablet by mouth at bedtime., Disp: , Rfl:    carbidopa-levodopa (SINEMET IR) 25-250 MG tablet, Take by mouth., Disp: , Rfl:    Carbidopa-Levodopa ER 23.75-95 MG CPCR, Take by mouth., Disp: , Rfl:    amantadine (SYMMETREL) 100 MG capsule, Take 100 mg by mouth daily as needed., Disp: , Rfl:    budesonide (RHINOCORT AQUA) 32 MCG/ACT nasal spray, USE 1 PUFF IN EACH NOSTRIL EVERY DAY AS NEEDED (Patient taking differently: Place 1 spray into both nostrils daily as needed for allergies.), Disp: 1 Bottle, Rfl: 5   busPIRone (BUSPAR) 7.5 MG tablet, Take 7.5 mg by mouth 2 (two) times daily. To 3 times daily, Disp: , Rfl:    carbidopa-levodopa (SINEMET CR) 50-200 MG tablet, Take 1 tablet by mouth at bedtime., Disp: , Rfl:    celecoxib (CELEBREX) 100 MG capsule, TAKE 1 CAPSULE BY MOUTH TWICE A DAY, Disp: 60 capsule, Rfl: 2   cholecalciferol (VITAMIN D) 1000 units tablet, Take 1,000 Units by mouth daily. , Disp: , Rfl:    enalapril (VASOTEC) 2.5 MG tablet, Take 1 tablet (2.5 mg total) by mouth 2 (two) times daily., Disp: 180 tablet, Rfl: 1   gabapentin (NEURONTIN) 100 MG capsule, Take 200 mg by mouth 3 (three) times daily., Disp: , Rfl:    glucose blood (QUINTET AC BLOOD GLUCOSE TEST) test strip, Use as instructed (Patient not taking: Reported on 05/28/2021), Disp: 100 each, Rfl: 12   GOCOVRI 137 MG CP24, Take by mouth. (Patient not taking: Reported on 05/28/2021), Disp: , Rfl:    hydrocortisone 2.5 %  lotion, Apply to the eyebrows, mid face, and beard area QHS on Tuesday, Thursday, and Saturday., Disp: 59 mL, Rfl: 6   Hypromellose (ARTIFICIAL TEARS OP), Place 1 drop into both eyes 4 (four) times daily as needed (for dry eyes). , Disp: , Rfl:    insulin aspart (NOVOLOG) 100 UNIT/ML injection, Inject 0-15 Units into the skin 3 (three) times daily with meals. (Patient not taking: Reported on 05/28/2021), Disp: 10 mL, Rfl: 11   ketoconazole (NIZORAL) 2 % cream, Apply 1 application topically 2 (two) times daily., Disp: 30 g, Rfl: 1   ketoconazole (NIZORAL) 2 % cream, Apply to the eyebrows, mid face, and beard area QHS on Monday, Wednesday, and Friday., Disp: 60 g, Rfl: 6   loratadine (CLARITIN) 10 MG tablet, Take 10 mg by mouth daily., Disp: , Rfl:    metFORMIN (GLUCOPHAGE) 500 MG tablet, TAKE 1 TABLET BY MOUTH 2 TIMES DAILY WITH A MEAL., Disp: 180 tablet, Rfl: 1   mirabegron ER (MYRBETRIQ) 25 MG TB24 tablet, Take 1 tablet (25 mg total) by mouth daily. (Patient not taking: No sig reported), Disp: 28 tablet, Rfl: 0   mometasone (ELOCON) 0.1 % cream, Apply to scaly rash area on neck once to twice daily as needed. Avoid the face, groin, and axilla., Disp: 50 g, Rfl: 1   Multiple Vitamin (MULTIVITAMIN WITH MINERALS) TABS tablet, Take 1 tablet by mouth daily., Disp: , Rfl:  nitrofurantoin, macrocrystal-monohydrate, (MACROBID) 100 MG capsule, Take 1 capsule (100 mg total) by mouth every 12 (twelve) hours. (Patient not taking: Reported on 05/28/2021), Disp: 14 capsule, Rfl: 0   NOURIANZ 20 MG TABS, Take 1 tablet by mouth at bedtime. (Patient not taking: No sig reported), Disp: , Rfl:    polyethylene glycol (MIRALAX / GLYCOLAX) packet, Take 17 g by mouth daily as needed. , Disp: , Rfl:    pramipexole (MIRAPEX) 0.5 MG tablet, Take 2 tablets (1 mg total) by mouth at bedtime. (Patient not taking: No sig reported), Disp: 30 tablet, Rfl: 0   QUEtiapine (SEROQUEL) 25 MG tablet, Take 25 mg by mouth at bedtime., Disp:  , Rfl:    RYTARY 23.75-95 MG CPCR, Take 2 capsules by mouth 2 (two) times daily., Disp: , Rfl:    RYTARY 61.25-245 MG CPCR, Take 1 capsule by mouth 4 (four) times daily., Disp: , Rfl:    sertraline (ZOLOFT) 25 MG tablet, Take 25 mg by mouth daily., Disp: , Rfl:    vitamin B-12 (CYANOCOBALAMIN) 1000 MCG tablet, Take 1,000 mcg by mouth daily., Disp: , Rfl:    vitamin C (ASCORBIC ACID) 500 MG tablet, Take 1,000 mg by mouth daily. , Disp: , Rfl:    vitamin E 180 MG (400 UNITS) capsule, Take 400 Units by mouth daily. , Disp: , Rfl:    Medications ordered in this encounter:  No orders of the defined types were placed in this encounter.    *If you need refills on other medications prior to your next appointment, please contact your pharmacy*  Follow-Up: Call back or seek an in-person evaluation if the symptoms worsen or if the condition fails to improve as anticipated.  Other Instructions Get plenty of rest and push fluids Prednisone for congestion Azithromycin for possible bacterial bronchitis Use OTC medications like ibuprofen or tylenol as needed fever or pain Follow up with PCP in 1-2 days via phone or e-visit for recheck and to ensure symptoms are improving Follow up in person at urgent care or go to the ED if you have any new or worsening symptoms such as fever, worsening cough, shortness of breath, chest tightness, chest pain, turning blue, changes in mental status, etc...    If you have been instructed to have an in-person evaluation today at a local Urgent Care facility, please use the link below. It will take you to a list of all of our available [facility], including address, phone number and hours of operation. Please do not delay care.  [facility]  If you or a family member do not have a primary care provider, use the link below to schedule a visit and establish care. When you choose a [place] primary care physician or advanced practice provider, you  gain a long-term partner in health. Find a Primary Care Provider  Learn more about [place]'s in-office and virtual care options: [facility]

## 2021-06-12 NOTE — Progress Notes (Signed)
I, Lestine Box, PA-C, attempted to connect with Roger Rodriguez; MRN [id number] on 06/12/21 via [employer] to complete a video urgent care visit. The patient was unable to successfully connect to the video platform. As such, the patient was contacted by this provider via phone to complete the encounter.    Virtual Visit Consent   [name], you are scheduled for a virtual visit with a [facility] provider today.     Just as with appointments in the office, your consent must be obtained to participate.  Your consent will be active for this visit and any virtual visit you may have with one of our providers in the next 365 days.     If you have a MyChart account, a copy of this consent can be sent to you electronically.  All virtual visits are billed to your insurance company just like a traditional visit in the office.    As this is a virtual visit, video technology does not allow for your provider to perform a traditional examination.  This may limit your provider's ability to fully assess your condition.  If your provider identifies any concerns that need to be evaluated in person or the need to arrange testing (such as labs, EKG, etc.), we will make arrangements to do so.     Although advances in technology are sophisticated, we cannot ensure that it will always work on either your end or our end.  If the connection with a video visit is poor, the visit may have to be switched to a telephone visit.  With either a video or telephone visit, we are not always able to ensure that we have a secure connection.     I need to obtain your verbal consent now.   Are you willing to proceed with your visit today? Yes   Roger Rodriguez has provided verbal consent on 06/12/2021 for a virtual visit (video or telephone).   Lestine Box, [place]   Date: 06/12/2021 5:36 PM   Virtual Visit via Video Note   I, Lestine Box, connected with  Roger Rodriguez  ([id number], 1940/08/26) on 06/12/21 at  5:15 PM EST  by a video-enabled telemedicine application and verified that I am speaking with the correct person using two identifiers.  Location: Patient: Virtual Visit Location Patient: Home Provider: Virtual Visit Location Provider: Home Office   I discussed the limitations of evaluation and management by telemedicine and the availability of in person appointments. The patient expressed understanding and agreed to proceed.    History of Present Illness:  HPI obtained from son, with patient's permission.  Patient has parkinson's and has difficulty speaking.   Roger Rodriguez is a 80 y.o. who identifies as a male who was assigned male at birth, and is being seen today for chest congestion 4-5 days.  Denies sick exposure to COVID, flu or strep.  Has tried OTC medications without relief.  Denies aggravating factors.  Reports previous symptoms in the past.   Complains of chills, runny nose, and sinus congestion.  Denies fever, fatigue, SOB, wheezing, chest pain, nausea, changes in bowel or bladder habits.     ROS: As per HPI.  All other pertinent ROS negative.      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   Renal cyst, right 09/28/2016   Hepatic steatosis 09/28/2016   Parkinson's disease ([place]) 09/18/2015   Restless leg 09/18/2015   Malignant neoplasm of prostate ([place]) 05/22/2015   Allergic rhinitis 04/02/2015   Arthritis 04/02/2015   Narrowing of intervertebral disc space 04/02/2015   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/2015   Essential (primary) hypertension 04/02/2015   Borderline diabetes 04/02/2015   Hyperlipidemia 04/02/2015   BP (high blood pressure) 04/02/2015   Infected sebaceous cyst 04/02/2015   Hernia, inguinal 04/02/2015   Knee pain 04/02/2015   Lumbar canal stenosis 04/02/2015   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   Malignant melanoma ([place]) 04/02/2015   Arthritis, degenerative 04/02/2015   Fatigue 04/02/2015   Idiopathic Parkinson's disease ([place]) 04/02/2015   Plantar fasciitis 04/02/2015   CA of prostate ([place]) 04/02/2015   Benign essential tremor 04/02/2015   Lower urinary tract infection 04/02/2015   Degeneration of intervertebral disc of lumbosacral region 05/10/2012    Allergies:  Allergies  Allergen Reactions   Sulfa Antibiotics Swelling    Lip swelling   Medications:  Current Outpatient Medications:    carbidopa-levodopa (SINEMET CR) 50-200 MG tablet, Take 1 tablet by mouth at bedtime., Disp: , Rfl:    carbidopa-levodopa (SINEMET IR) 25-250 MG tablet, Take by mouth., Disp: , Rfl:    Carbidopa-Levodopa ER 23.75-95 MG CPCR, Take by mouth., Disp: , Rfl:    amantadine (SYMMETREL) 100 MG capsule, Take 100 mg by mouth daily as needed., Disp: , Rfl:    budesonide (RHINOCORT AQUA) 32 MCG/ACT nasal spray, USE 1 PUFF IN EACH NOSTRIL EVERY DAY AS NEEDED (Patient taking differently: Place 1 spray into both nostrils daily as needed for allergies.), Disp: 1 Bottle, Rfl: 5   busPIRone (BUSPAR) 7.5 MG tablet, Take 7.5 mg by mouth 2 (two) times daily. To 3 times daily, Disp: , Rfl:    carbidopa-levodopa (SINEMET CR) 50-200 MG tablet, Take 1 tablet by mouth at bedtime., Disp: , Rfl:    celecoxib (CELEBREX) 100 MG capsule, TAKE 1 CAPSULE BY MOUTH TWICE A DAY, Disp: 60 capsule, Rfl: 2   cholecalciferol (VITAMIN D) 1000 units tablet, Take 1,000 Units by mouth daily. , Disp: , Rfl:    enalapril (VASOTEC) 2.5 MG tablet, Take 1 tablet (2.5 mg total) by mouth 2 (two) times daily., Disp:  180 tablet, Rfl: 1   gabapentin (NEURONTIN) 100 MG capsule, Take 200 mg by mouth 3 (three) times daily., Disp: , Rfl:    glucose blood (QUINTET AC BLOOD GLUCOSE TEST) test strip, Use as instructed (Patient not taking: Reported on 05/28/2021), Disp: 100 each, Rfl: 12   GOCOVRI 137 MG CP24, Take by mouth. (Patient not taking: Reported on 05/28/2021), Disp: , Rfl:    hydrocortisone 2.5 % lotion, Apply to the eyebrows, mid face, and beard area QHS on Tuesday, Thursday, and Saturday., Disp: 59 mL, Rfl: 6   Hypromellose (ARTIFICIAL TEARS OP), Place 1 drop into both eyes 4 (four) times daily as needed (for dry eyes). , Disp: , Rfl:    insulin aspart (NOVOLOG) 100 UNIT/ML injection, Inject 0-15 Units into the skin 3 (three) times daily with meals. (Patient not taking: Reported on 05/28/2021), Disp: 10 mL, Rfl: 11   ketoconazole (NIZORAL) 2 %  cream, Apply 1 application topically 2 (two) times daily., Disp: 30 g, Rfl: 1   ketoconazole (NIZORAL) 2 % cream, Apply to the eyebrows, mid face, and beard area QHS on Monday, Wednesday, and Friday., Disp: 60 g, Rfl: 6   loratadine (CLARITIN) 10 MG tablet, Take 10 mg by mouth daily., Disp: , Rfl:    metFORMIN (GLUCOPHAGE) 500 MG tablet, TAKE 1 TABLET BY MOUTH 2 TIMES DAILY WITH A MEAL., Disp: 180 tablet, Rfl: 1   mirabegron ER (MYRBETRIQ) 25 MG TB24 tablet, Take 1 tablet (25 mg total) by mouth daily. (Patient not taking: No sig reported), Disp: 28 tablet, Rfl: 0   mometasone (ELOCON) 0.1 % cream, Apply to scaly rash area on neck once to twice daily as needed. Avoid the face, groin, and axilla., Disp: 50 g, Rfl: 1   Multiple Vitamin (MULTIVITAMIN WITH MINERALS) TABS tablet, Take 1 tablet by mouth daily., Disp: , Rfl:    nitrofurantoin, macrocrystal-monohydrate, (MACROBID) 100 MG capsule, Take 1 capsule (100 mg total) by mouth every 12 (twelve) hours. (Patient not taking: Reported on 05/28/2021), Disp: 14 capsule, Rfl: 0   NOURIANZ 20 MG TABS, Take 1 tablet by mouth at  bedtime. (Patient not taking: No sig reported), Disp: , Rfl:    polyethylene glycol (MIRALAX / GLYCOLAX) packet, Take 17 g by mouth daily as needed. , Disp: , Rfl:    pramipexole (MIRAPEX) 0.5 MG tablet, Take 2 tablets (1 mg total) by mouth at bedtime. (Patient not taking: No sig reported), Disp: 30 tablet, Rfl: 0   QUEtiapine (SEROQUEL) 25 MG tablet, Take 25 mg by mouth at bedtime., Disp: , Rfl:    RYTARY 23.75-95 MG CPCR, Take 2 capsules by mouth 2 (two) times daily., Disp: , Rfl:    RYTARY 61.25-245 MG CPCR, Take 1 capsule by mouth 4 (four) times daily., Disp: , Rfl:    sertraline (ZOLOFT) 25 MG tablet, Take 25 mg by mouth daily., Disp: , Rfl:    vitamin B-12 (CYANOCOBALAMIN) 1000 MCG tablet, Take 1,000 mcg by mouth daily., Disp: , Rfl:    vitamin C (ASCORBIC ACID) 500 MG tablet, Take 1,000 mg by mouth daily. , Disp: , Rfl:    vitamin E 180 MG (400 UNITS) capsule, Take 400 Units by mouth daily. , Disp: , Rfl:   Observations/Objective: Resting comfortably at home. Answers questions appropriately when asked.   No labored breathing.  Speech is clear and coherent with logical content.  Patient is alert and oriented at baseline.   Assessment and Plan: 1. Acute bacterial bronchitis Get plenty of rest and push fluids Prednisone for congestion Azithromycin for possible bacterial bronchitis Use OTC medications like ibuprofen or tylenol as needed fever or pain Follow up with PCP in 1-2 days via phone or e-visit for recheck and to ensure symptoms are improving Follow up in person at urgent care or go to the ED if you have any new or worsening symptoms such as fever, worsening cough, shortness of breath, chest tightness, chest pain, turning blue, changes in mental status, etc...    Follow Up Instructions: I discussed the assessment and treatment plan with the patient. The patient was provided an opportunity to ask questions and all were answered. The patient agreed with the plan and demonstrated an  understanding of the instructions.  A copy of instructions were sent to the patient via MyChart unless otherwise noted below.   The patient was advised to call back or seek an in-person evaluation if the symptoms worsen or  if the condition fails to improve as anticipated.  Time:  I spent 10-15 minutes with the patient via telehealth technology discussing the above problems/concerns.    Lestine Box, PA-C

## 2021-06-16 NOTE — Telephone Encounter (Signed)
Patient did telemed visit 06/12/2021.

## 2021-06-19 ENCOUNTER — Ambulatory Visit: Payer: PPO | Admitting: Urology

## 2021-06-23 ENCOUNTER — Ambulatory Visit: Payer: Self-pay | Admitting: *Deleted

## 2021-06-23 NOTE — Telephone Encounter (Signed)
  Chief Complaint: urine strong odor, dark yellow urine, back pain cloudy  since Saturday evening 06/21/21.  Symptoms: back pain , headache , see above  Frequency: ileostomy bag draining urine  Pertinent Negatives: Patient denies fever.  Disposition: [] ED /[x] Urgent Care (no appt availability in office) / [] Appointment(In office/virtual)/ []  [facility]/ [] Home Care/ [] Refused Recommended Disposition  Additional Notes: Caregiver reports patient with mobility issues and patient does not want to come in for 06/24/21 OV if possible for appt. Can patient get antibiotic with VV?  FC notified.

## 2021-06-23 NOTE — Telephone Encounter (Signed)
Please advise 

## 2021-06-23 NOTE — Telephone Encounter (Signed)
Maybe caregiver can drop off urine sample and he could do VV to talk about it

## 2021-06-23 NOTE — Telephone Encounter (Signed)
Reason for Disposition  Side (flank) or lower back pain present  Answer Assessment - Initial Assessment Questions 1. SYMPTOM: "What's the main symptom you're concerned about?" (e.g., frequency, incontinence)     Urine dark yellow cloudy , looks like "sludge in tubing'" or "snot" 2. ONSET: "When did the symptoms  start?"     Saturday 06/21/21 3. PAIN: "Is there any pain?" If Yes, ask: "How bad is it?" (Scale: 1-10; mild, moderate, severe)     Pain in back , moderate to severe 4. CAUSE: "What do you think is causing the symptoms?"     UTI 5. OTHER SYMPTOMS: "Do you have any other symptoms?" (e.g., fever, flank pain, blood in urine, pain with urination)     Strong odor to urine, headache back pain  6. PREGNANCY: "Is there any chance you are pregnant?" "When was your last menstrual period?"     na  Protocols used: Urinary Symptoms-A-AH

## 2021-06-24 ENCOUNTER — Ambulatory Visit: Payer: Self-pay | Admitting: *Deleted

## 2021-06-24 NOTE — Telephone Encounter (Signed)
Pt returned call. States he is not able to have caregiver drop off sample today. He has appt with urologist tomorrow. Requesting an antibiotic be called in "So I can start that before my appt tomorrow." Advised he may need VV first but assured pt NT would route to practice for PCPs review. CB# [phone]

## 2021-06-24 NOTE — Telephone Encounter (Signed)
NA.Not able to leave a voice message, mailbox is full.If patient's care giver calls back ok to give message to patient.

## 2021-06-24 NOTE — Telephone Encounter (Signed)
Can do virtual visit with any provider with openings and they can evaluate whether they want to go ahead and start abx before urology appt tomorrow. (Of note, urology typically would recommend getting a culture before abx)

## 2021-06-24 NOTE — Telephone Encounter (Signed)
Patient advised as below. He reports he will wait for urology appt tomorrow. I did advise to call urology and ask if a urine sample can be dropped off today and possibly them start antibiotic. Patient agreed. However he reports most likely will wait till appt.

## 2021-06-24 NOTE — Progress Notes (Signed)
Suprapubic Cath Change  Patient is present today for a suprapubic catheter change due to urinary retention.  9 ml of water was drained from the balloon, a 16 FR foley cath was removed from the tract with out difficulty.  Site was cleaned and prepped in a sterile fashion with betadine.  A 16 FR foley cath was replaced into the tract no complications were noted. Urine return was noted, 10 ml of sterile water was inflated into the balloon and a leg bag was attached for drainage.  Patient tolerated well. A night bag was given to patient and proper instruction was given on how to switch bags.    Performed by: Nori Riis, PA-C and Evelina Bucy, CMA  Follow up: Patient continues to experience lower back pain, headache and cloudy urine.  We have discussed gentamicin bladder instillation and effort to clear the urine while reducing the risk of antibiotic resistance as he and his caregiver have a difficult time understanding concept of colonization versus recurrent UTIs and believe his symptoms are the result of an UTI.  Discussed the process of gentamicin installations and caregiver states that she is willing to be instructed in gentamicin instillation so that she can continue it at home as is difficult to bring him to the office daily.  I spent 15 minutes on the day of the encounter to include pre-visit record review, face-to-face time with the patient, and post-visit ordering of tests.

## 2021-06-24 NOTE — Telephone Encounter (Signed)
Noted  

## 2021-06-25 ENCOUNTER — Other Ambulatory Visit: Payer: Self-pay

## 2021-06-25 ENCOUNTER — Ambulatory Visit: Payer: PPO | Admitting: Urology

## 2021-06-25 DIAGNOSIS — Z9359 Other cystostomy status: Secondary | ICD-10-CM

## 2021-06-25 DIAGNOSIS — N390 Urinary tract infection, site not specified: Secondary | ICD-10-CM | POA: Diagnosis not present

## 2021-06-26 ENCOUNTER — Ambulatory Visit: Payer: PPO | Admitting: Urology

## 2021-06-26 LAB — MICROSCOPIC EXAMINATION: Epithelial Cells (non renal): NONE SEEN /hpf (ref 0–10)

## 2021-06-26 LAB — URINALYSIS, COMPLETE
Bilirubin, UA: NEGATIVE
Glucose, UA: NEGATIVE
Nitrite, UA: NEGATIVE
Specific Gravity, UA: 1.025 (ref 1.005–1.030)
Urobilinogen, Ur: 1 mg/dL (ref 0.2–1.0)
pH, UA: 5.5 (ref 5.0–7.5)

## 2021-06-28 LAB — CULTURE, URINE COMPREHENSIVE

## 2021-07-03 ENCOUNTER — Telehealth: Payer: Self-pay | Admitting: Family Medicine

## 2021-07-03 NOTE — Telephone Encounter (Signed)
[employer] Received order for alternating Community education officer pt doesn't qualify through insurance unless he has pressure ulcers or has been treated for them in the past year / they don't see this in any office notes / they need OV notes that show he has pressure ulcers or has been treated for them this year or can provider rewrite order for a Gel overlay mattress if he doesn't qualify for the other / fax# [phone]/ please advise

## 2021-07-03 NOTE — Telephone Encounter (Signed)
For DME, he is going to need face-to-face with any provider to document this for insurance coverage.

## 2021-07-08 ENCOUNTER — Telehealth: Payer: Self-pay | Admitting: Pharmacist

## 2021-07-08 ENCOUNTER — Ambulatory Visit: Payer: Self-pay

## 2021-07-08 ENCOUNTER — Other Ambulatory Visit: Payer: Self-pay

## 2021-07-08 MED ORDER — PAXLOVID (300/100) 20 X 150 MG & 10 X 100MG PO TBPK
ORAL_TABLET | ORAL | 0 refills | Status: DC
  Filled 2021-07-08: qty 30, 5d supply, fill #0

## 2021-07-08 NOTE — Telephone Encounter (Signed)
Attempted call na, vm is full.

## 2021-07-08 NOTE — Telephone Encounter (Signed)
Copied from [place] [id number]. Topic: General - Other >> Jul 08, 2021  9:38 AM Yvette Rack wrote: Reason for CRM: Pt caregiver [name] stated someone called back but as soon as she answered the phone hung up. [facility] requests that the call be returned.

## 2021-07-08 NOTE — Telephone Encounter (Signed)
Mrs. [name] reports patient already has an air mattress that he uses everyday.

## 2021-07-08 NOTE — Telephone Encounter (Signed)
Outpatient Pharmacy Oral COVID Treatment Note  I connected with Naomie Dean son, Dayven Linsley, on 07/08/2021/12:33 PM by telephone and verified that I am speaking with the correct person using two identifiers.  I discussed the limitations, risks, security, and privacy concerns of performing an evaluation and management service by telephone and the availability of in person appointments via referral to a physician. The patient expressed understanding and agreed to proceed.  Pharmacy location: [facility]  Diagnosis: COVID-19 infection  Purpose of visit: Discussion of potential use of Paxlovid, a new treatment for mild to moderate COVID-19 viral infection in non-hospitalized patients.  Subjective/Objective: Patient is a 80 y.o. male who is presenting with COVID 19 viral infection.  COVID 19 viral infection. Their symptoms began on 07/06/2021 . The patient has confirmed COVID-19 via a home test on 07/07/2021.   Past Medical History:  Diagnosis Date   Allergy    allergic rhinitis   Arthritis    OA   Chronic kidney disease    stones   Depression    Diabetes mellitus without complication (HCC)    Family history of adverse reaction to anesthesia    Hematuria    History of kidney stones    Hyperlipidemia    Hypertension    no meds since parkinsons meds started   Incontinence of urine    Inguinal hernia    Neuromuscular disorder ([place])    parkinsons   Overactive bladder    Parkinson disease ([place])    Parkinson's disease ([place])    Prostate cancer ([place])    Rhinitis, allergic    Shortness of breath dyspnea    doe secondary to parkinsons     Allergies  Allergen Reactions   Sulfa Antibiotics Swelling    Lip swelling     Current Outpatient Medications:    amantadine (SYMMETREL) 100 MG capsule, Take 100 mg by mouth daily as needed., Disp: , Rfl:    budesonide (RHINOCORT AQUA) 32 MCG/ACT nasal spray, USE 1 PUFF IN EACH NOSTRIL EVERY DAY AS NEEDED (Patient taking  differently: Place 1 spray into both nostrils daily as needed for allergies.), Disp: 1 Bottle, Rfl: 5   busPIRone (BUSPAR) 7.5 MG tablet, Take 7.5 mg by mouth 2 (two) times daily. To 3 times daily, Disp: , Rfl:    carbidopa-levodopa (SINEMET CR) 50-200 MG tablet, Take 1 tablet by mouth at bedtime., Disp: , Rfl:    carbidopa-levodopa (SINEMET CR) 50-200 MG tablet, Take 1 tablet by mouth at bedtime., Disp: , Rfl:    carbidopa-levodopa (SINEMET IR) 25-250 MG tablet, Take by mouth., Disp: , Rfl:    Carbidopa-Levodopa ER 23.75-95 MG CPCR, Take by mouth., Disp: , Rfl:    celecoxib (CELEBREX) 100 MG capsule, TAKE 1 CAPSULE BY MOUTH TWICE A DAY, Disp: 60 capsule, Rfl: 2   cholecalciferol (VITAMIN D) 1000 units tablet, Take 1,000 Units by mouth daily. , Disp: , Rfl:    enalapril (VASOTEC) 2.5 MG tablet, Take 1 tablet (2.5 mg total) by mouth 2 (two) times daily., Disp: 180 tablet, Rfl: 1   gabapentin (NEURONTIN) 100 MG capsule, Take 200 mg by mouth 3 (three) times daily., Disp: , Rfl:    glucose blood (QUINTET AC BLOOD GLUCOSE TEST) test strip, Use as instructed (Patient not taking: Reported on 05/28/2021), Disp: 100 each, Rfl: 12   GOCOVRI 137 MG CP24, Take by mouth. (Patient not taking: Reported on 05/28/2021), Disp: , Rfl:    hydrocortisone 2.5 % lotion, Apply to the eyebrows, mid face,  and beard area QHS on Tuesday, Thursday, and Saturday., Disp: 59 mL, Rfl: 6   Hypromellose (ARTIFICIAL TEARS OP), Place 1 drop into both eyes 4 (four) times daily as needed (for dry eyes). , Disp: , Rfl:    insulin aspart (NOVOLOG) 100 UNIT/ML injection, Inject 0-15 Units into the skin 3 (three) times daily with meals. (Patient not taking: Reported on 05/28/2021), Disp: 10 mL, Rfl: 11   ketoconazole (NIZORAL) 2 % cream, Apply 1 application topically 2 (two) times daily., Disp: 30 g, Rfl: 1   ketoconazole (NIZORAL) 2 % cream, Apply to the eyebrows, mid face, and beard area QHS on Monday, Wednesday, and Friday., Disp: 60 g,  Rfl: 6   loratadine (CLARITIN) 10 MG tablet, Take 10 mg by mouth daily., Disp: , Rfl:    metFORMIN (GLUCOPHAGE) 500 MG tablet, TAKE 1 TABLET BY MOUTH 2 TIMES DAILY WITH A MEAL., Disp: 180 tablet, Rfl: 1   mirabegron ER (MYRBETRIQ) 25 MG TB24 tablet, Take 1 tablet (25 mg total) by mouth daily. (Patient not taking: No sig reported), Disp: 28 tablet, Rfl: 0   mometasone (ELOCON) 0.1 % cream, Apply to scaly rash area on neck once to twice daily as needed. Avoid the face, groin, and axilla., Disp: 50 g, Rfl: 1   Multiple Vitamin (MULTIVITAMIN WITH MINERALS) TABS tablet, Take 1 tablet by mouth daily., Disp: , Rfl:    nirmatrelvir & ritonavir (PAXLOVID, 300/100,) 20 x 150 MG & 10 x 100MG TBPK, Take 3 tablets by mouth twice a day, Disp: 30 tablet, Rfl: 0   nitrofurantoin, macrocrystal-monohydrate, (MACROBID) 100 MG capsule, Take 1 capsule (100 mg total) by mouth every 12 (twelve) hours. (Patient not taking: Reported on 05/28/2021), Disp: 14 capsule, Rfl: 0   NOURIANZ 20 MG TABS, Take 1 tablet by mouth at bedtime. (Patient not taking: No sig reported), Disp: , Rfl:    polyethylene glycol (MIRALAX / GLYCOLAX) packet, Take 17 g by mouth daily as needed. , Disp: , Rfl:    pramipexole (MIRAPEX) 0.5 MG tablet, Take 2 tablets (1 mg total) by mouth at bedtime. (Patient not taking: No sig reported), Disp: 30 tablet, Rfl: 0   QUEtiapine (SEROQUEL) 25 MG tablet, Take 25 mg by mouth at bedtime., Disp: , Rfl:    RYTARY 23.75-95 MG CPCR, Take 2 capsules by mouth 2 (two) times daily., Disp: , Rfl:    RYTARY 61.25-245 MG CPCR, Take 1 capsule by mouth 4 (four) times daily., Disp: , Rfl:    sertraline (ZOLOFT) 25 MG tablet, Take 25 mg by mouth daily., Disp: , Rfl:    vitamin B-12 (CYANOCOBALAMIN) 1000 MCG tablet, Take 1,000 mcg by mouth daily., Disp: , Rfl:    vitamin C (ASCORBIC ACID) 500 MG tablet, Take 1,000 mg by mouth daily. , Disp: , Rfl:    vitamin E 180 MG (400 UNITS) capsule, Take 400 Units by mouth daily. , Disp: ,  Rfl:   Lab Monitoring: eGFR 88  Drug Interactions Noted: Patient needs to hold prn Buspar and Seroquel while on Paxlovid  Plan:  This patient is a 80 y.o. male that meets the criteria for Emergency Use Authorization of Paxlovid. After reviewing the emergency use authorization with the patient, the patient agrees to receive molnupiravir.  Through FDA guidance and current [place] standing order Paxlovid will be prescribed to the patient.   Patient contacted for counseling on 07/07/2021 and verbalized understanding.   Delivery or Pick-Up Date: 07/07/21.  Follow up instructions:    Take  prescription BID x 5 days as directed Counseling was provided by pharmacist. Reach out to pharmacist with follow up questions For concerns regarding further COVID symptoms please follow up with your PCP or urgent care For urgent or life-threatening issues, seek care at your local emergency department   Letta Median 07/08/2021, 12:33 PM [facility] Pharmacist Phone# [phone]

## 2021-07-08 NOTE — Telephone Encounter (Signed)
Called back pt care assistant and she stated that he was prescribed Paxlovid. Per [name], the pt ate breakfast is doing well. [name] stated the fever

## 2021-07-09 ENCOUNTER — Emergency Department: Payer: PPO

## 2021-07-09 ENCOUNTER — Other Ambulatory Visit: Payer: Self-pay

## 2021-07-09 ENCOUNTER — Inpatient Hospital Stay
Admission: EM | Admit: 2021-07-09 | Discharge: 2021-07-14 | DRG: 177 | Disposition: A | Discharge status: Home / Self Care | Payer: PPO | Attending: Internal Medicine | Admitting: Internal Medicine

## 2021-07-09 ENCOUNTER — Ambulatory Visit: Payer: Self-pay

## 2021-07-09 DIAGNOSIS — J9601 Acute respiratory failure with hypoxia: Secondary | ICD-10-CM | POA: Diagnosis present

## 2021-07-09 DIAGNOSIS — R410 Disorientation, unspecified: Secondary | ICD-10-CM | POA: Diagnosis not present

## 2021-07-09 DIAGNOSIS — J811 Chronic pulmonary edema: Secondary | ICD-10-CM | POA: Diagnosis not present

## 2021-07-09 DIAGNOSIS — K760 Fatty (change of) liver, not elsewhere classified: Secondary | ICD-10-CM | POA: Diagnosis present

## 2021-07-09 DIAGNOSIS — G20 Parkinson's disease: Secondary | ICD-10-CM | POA: Diagnosis present

## 2021-07-09 DIAGNOSIS — M48061 Spinal stenosis, lumbar region without neurogenic claudication: Secondary | ICD-10-CM | POA: Diagnosis present

## 2021-07-09 DIAGNOSIS — E119 Type 2 diabetes mellitus without complications: Secondary | ICD-10-CM | POA: Diagnosis present

## 2021-07-09 DIAGNOSIS — E7800 Pure hypercholesterolemia, unspecified: Secondary | ICD-10-CM | POA: Diagnosis present

## 2021-07-09 DIAGNOSIS — U071 COVID-19: Principal | ICD-10-CM | POA: Diagnosis present

## 2021-07-09 DIAGNOSIS — R531 Weakness: Secondary | ICD-10-CM | POA: Diagnosis not present

## 2021-07-09 DIAGNOSIS — E785 Hyperlipidemia, unspecified: Secondary | ICD-10-CM | POA: Diagnosis present

## 2021-07-09 DIAGNOSIS — Z7984 Long term (current) use of oral hypoglycemic drugs: Secondary | ICD-10-CM | POA: Diagnosis not present

## 2021-07-09 DIAGNOSIS — R0602 Shortness of breath: Secondary | ICD-10-CM | POA: Diagnosis not present

## 2021-07-09 DIAGNOSIS — G4700 Insomnia, unspecified: Secondary | ICD-10-CM | POA: Diagnosis present

## 2021-07-09 DIAGNOSIS — I10 Essential (primary) hypertension: Secondary | ICD-10-CM | POA: Diagnosis present

## 2021-07-09 DIAGNOSIS — I959 Hypotension, unspecified: Secondary | ICD-10-CM | POA: Diagnosis not present

## 2021-07-09 DIAGNOSIS — Z79899 Other long term (current) drug therapy: Secondary | ICD-10-CM

## 2021-07-09 DIAGNOSIS — G2581 Restless legs syndrome: Secondary | ICD-10-CM | POA: Diagnosis present

## 2021-07-09 DIAGNOSIS — F321 Major depressive disorder, single episode, moderate: Secondary | ICD-10-CM | POA: Diagnosis present

## 2021-07-09 DIAGNOSIS — F0280 Dementia in other diseases classified elsewhere without behavioral disturbance: Secondary | ICD-10-CM | POA: Diagnosis present

## 2021-07-09 DIAGNOSIS — Z882 Allergy status to sulfonamides status: Secondary | ICD-10-CM | POA: Diagnosis not present

## 2021-07-09 DIAGNOSIS — M1990 Unspecified osteoarthritis, unspecified site: Secondary | ICD-10-CM | POA: Diagnosis present

## 2021-07-09 DIAGNOSIS — F419 Anxiety disorder, unspecified: Secondary | ICD-10-CM | POA: Diagnosis present

## 2021-07-09 DIAGNOSIS — G250 Essential tremor: Secondary | ICD-10-CM | POA: Diagnosis present

## 2021-07-09 DIAGNOSIS — R059 Cough, unspecified: Secondary | ICD-10-CM | POA: Diagnosis not present

## 2021-07-09 DIAGNOSIS — Z743 Need for continuous supervision: Secondary | ICD-10-CM | POA: Diagnosis not present

## 2021-07-09 DIAGNOSIS — R0902 Hypoxemia: Secondary | ICD-10-CM | POA: Diagnosis not present

## 2021-07-09 LAB — CBC WITH DIFFERENTIAL/PLATELET
Abs Immature Granulocytes: 0.02 10*3/uL (ref 0.00–0.07)
Basophils Absolute: 0 10*3/uL (ref 0.0–0.1)
Basophils Relative: 0 %
Eosinophils Absolute: 0 10*3/uL (ref 0.0–0.5)
Eosinophils Relative: 1 %
HCT: 42.6 % (ref 39.0–52.0)
Hemoglobin: 13.9 g/dL (ref 13.0–17.0)
Immature Granulocytes: 0 %
Lymphocytes Relative: 11 %
Lymphs Abs: 0.9 10*3/uL (ref 0.7–4.0)
MCH: 31.5 pg (ref 26.0–34.0)
MCHC: 32.6 g/dL (ref 30.0–36.0)
MCV: 96.6 fL (ref 80.0–100.0)
Monocytes Absolute: 1 10*3/uL (ref 0.1–1.0)
Monocytes Relative: 12 %
Neutro Abs: 6.1 10*3/uL (ref 1.7–7.7)
Neutrophils Relative %: 76 %
Platelets: 156 10*3/uL (ref 150–400)
RBC: 4.41 MIL/uL (ref 4.22–5.81)
RDW: 14.6 % (ref 11.5–15.5)
WBC: 8 10*3/uL (ref 4.0–10.5)
nRBC: 0 % (ref 0.0–0.2)

## 2021-07-09 LAB — COMPREHENSIVE METABOLIC PANEL
ALT: 7 U/L (ref 0–44)
AST: 13 U/L — ABNORMAL LOW (ref 15–41)
Albumin: 3.4 g/dL — ABNORMAL LOW (ref 3.5–5.0)
Alkaline Phosphatase: 51 U/L (ref 38–126)
Anion gap: 8 (ref 5–15)
BUN: 33 mg/dL — ABNORMAL HIGH (ref 8–23)
CO2: 26 mmol/L (ref 22–32)
Calcium: 8.5 mg/dL — ABNORMAL LOW (ref 8.9–10.3)
Chloride: 98 mmol/L (ref 98–111)
Creatinine, Ser: 1.04 mg/dL (ref 0.61–1.24)
GFR, Estimated: >60 mL/min (ref >60)
Glucose, Bld: 143 mg/dL — ABNORMAL HIGH (ref 70–99)
Potassium: 3.9 mmol/L (ref 3.5–5.1)
Sodium: 132 mmol/L — ABNORMAL LOW (ref 135–145)
Total Bilirubin: 1.2 mg/dL (ref 0.3–1.2)
Total Protein: 7 g/dL (ref 6.5–8.1)

## 2021-07-09 LAB — TROPONIN I (HIGH SENSITIVITY): Troponin I (High Sensitivity): 12 ng/L (ref <18)

## 2021-07-09 LAB — D-DIMER, QUANTITATIVE: D-Dimer, Quant: 1.2 ug/mL-FEU — ABNORMAL HIGH (ref 0.00–0.50)

## 2021-07-09 LAB — LACTIC ACID, PLASMA
Lactic Acid, Venous: 1 mmol/L (ref 0.5–1.9)
Lactic Acid, Venous: 0.9 mmol/L (ref 0.5–1.9)

## 2021-07-09 LAB — BRAIN NATRIURETIC PEPTIDE: B Natriuretic Peptide: 142.2 pg/mL — ABNORMAL HIGH (ref 0.0–100.0)

## 2021-07-09 LAB — PROCALCITONIN: Procalcitonin: 0.16 ng/mL

## 2021-07-09 MED ORDER — PREDNISONE 20 MG PO TABS
50.0000 mg | ORAL_TABLET | Freq: Every day | ORAL | Status: DC
  Administered 2021-07-13 – 2021-07-14 (×2): 50 mg via ORAL
  Filled 2021-07-09 (×2): qty 3

## 2021-07-09 MED ORDER — ALBUTEROL SULFATE HFA 108 (90 BASE) MCG/ACT IN AERS
2.0000 | INHALATION_SPRAY | RESPIRATORY_TRACT | Status: AC | PRN
  Filled 2021-07-09: qty 6.7

## 2021-07-09 MED ORDER — INSULIN ASPART 100 UNIT/ML IJ SOLN
0.0000 [IU] | Freq: Every day | INTRAMUSCULAR | Status: DC
  Administered 2021-07-10: 22:00:00 3 [IU] via SUBCUTANEOUS
  Administered 2021-07-12: 5 [IU] via SUBCUTANEOUS
  Administered 2021-07-13: 3 [IU] via SUBCUTANEOUS
  Filled 2021-07-09 (×3): qty 1

## 2021-07-09 MED ORDER — METHYLPREDNISOLONE SODIUM SUCC 40 MG IJ SOLR
0.5000 mg/kg | Freq: Two times a day (BID) | INTRAMUSCULAR | Status: AC
  Administered 2021-07-10 – 2021-07-12 (×6): 37.6 mg via INTRAVENOUS
  Filled 2021-07-09 (×6): qty 1

## 2021-07-09 MED ORDER — ONDANSETRON HCL 4 MG/2ML IJ SOLN: 4.0000 mg | Freq: Four times a day (QID) | INTRAMUSCULAR | Status: DC | PRN

## 2021-07-09 MED ORDER — SODIUM CHLORIDE 0.9 % IV SOLN
100.0000 mg | Freq: Every day | INTRAVENOUS | Status: AC
  Administered 2021-07-10 – 2021-07-13 (×4): 100 mg via INTRAVENOUS
  Filled 2021-07-09 (×4): qty 20

## 2021-07-09 MED ORDER — INSULIN ASPART 100 UNIT/ML IJ SOLN
0.0000 [IU] | Freq: Three times a day (TID) | INTRAMUSCULAR | Status: DC
  Administered 2021-07-10: 19:00:00 11 [IU] via SUBCUTANEOUS
  Administered 2021-07-11 (×3): 7 [IU] via SUBCUTANEOUS
  Administered 2021-07-12: 4 [IU] via SUBCUTANEOUS
  Administered 2021-07-12: 7 [IU] via SUBCUTANEOUS
  Administered 2021-07-12: 15 [IU] via SUBCUTANEOUS
  Administered 2021-07-13 (×2): 3 [IU] via SUBCUTANEOUS
  Administered 2021-07-13: 11 [IU] via SUBCUTANEOUS
  Administered 2021-07-14: 3 [IU] via SUBCUTANEOUS
  Filled 2021-07-09 (×9): qty 1

## 2021-07-09 MED ORDER — METHYLPREDNISOLONE SODIUM SUCC 125 MG IJ SOLR
80.0000 mg | Freq: Once | INTRAMUSCULAR | Status: AC
  Administered 2021-07-09: 21:00:00 80 mg via INTRAVENOUS
  Filled 2021-07-09: qty 2

## 2021-07-09 MED ORDER — ENOXAPARIN SODIUM 40 MG/0.4ML IJ SOSY
40.0000 mg | PREFILLED_SYRINGE | INTRAMUSCULAR | Status: DC
  Administered 2021-07-10 – 2021-07-14 (×5): 40 mg via SUBCUTANEOUS
  Filled 2021-07-09 (×5): qty 0.4

## 2021-07-09 MED ORDER — ONDANSETRON HCL 4 MG PO TABS: 4.0000 mg | ORAL_TABLET | Freq: Four times a day (QID) | ORAL | Status: DC | PRN

## 2021-07-09 MED ORDER — SODIUM CHLORIDE 0.9 % IV SOLN
200.0000 mg | Freq: Once | INTRAVENOUS | Status: AC
  Administered 2021-07-09: 22:00:00 200 mg via INTRAVENOUS
  Filled 2021-07-09: qty 40

## 2021-07-09 MED ORDER — ACETAMINOPHEN 325 MG PO TABS: 650.0000 mg | ORAL_TABLET | Freq: Four times a day (QID) | ORAL | Status: AC | PRN

## 2021-07-09 NOTE — Telephone Encounter (Signed)
Pt son Roger Rodriguez requests call back from a nurse for some advice. Roger Rodriguez stated pt has Parkinson's and having problems with breathing. Roger Rodriguez was not with pt at time of call but stated he will be going back over where pt is. Roger Rodriguez stated pt O2 is between 87 and 91. Cb# [id number]     Chief Complaint: COVID 19 positive Symptoms: Cough, fever, wheezing, "rattling in his chest." O2 saturation 87-91%. Saturation is usually in "upper 90's". Frequency: Started Monday Pertinent Negatives: Patient denies  Disposition: [x] ED /[] Urgent Care (no appt availability in office) / [] Appointment(In office/virtual)/ []  [facility]/ [] Home Care/ [] Refused Recommended Disposition  Additional Notes: Son, Roger Rodriguez, states he will go and check on pt. Instructed if pt. States fever comes down with Tylenol. Pt. Is eating and drinking.Sleeping more. Instructed it would be best to have pt. Evaluated in ED.   Reason for Disposition  Patient sounds very sick or weak to the triager  Answer Assessment - Initial Assessment Questions 1. RESPIRATORY STATUS: "Describe your breathing?" (e.g., wheezing, shortness of breath, unable to speak, severe coughing)      WheModerateezing, sounds rattling 2. ONSET: "When did this breathing problem begin?"      Monday 3. PATTERN "Does the difficult breathing come and go, or has it been constant since it started?"      Constant 4. SEVERITY: "How bad is your breathing?" (e.g., mild, moderate, severe)    - MILD: No SOB at rest, mild SOB with walking, speaks normally in sentences, can lie down, no retractions, pulse < 100.    - MODERATE: SOB at rest, SOB with minimal exertion and prefers to sit, cannot lie down flat, speaks in phrases, mild retractions, audible wheezing, pulse 100-120.    - SEVERE: Very SOB at rest, speaks in single words, struggling to breathe, sitting hunched forward, retractions, pulse > 120      Moderate 5. RECURRENT SYMPTOM: "Have you had difficulty  breathing before?" If Yes, ask: "When was the last time?" and "What happened that time?"      No 6. CARDIAC HISTORY: "Do you have any history of heart disease?" (e.g., heart attack, angina, bypass surgery, angioplasty)      No 7. LUNG HISTORY: "Do you have any history of lung disease?"  (e.g., pulmonary embolus, asthma, emphysema)     No 8. CAUSE: "What do you think is causing the breathing problem?"      COVID 19 positive 9. OTHER SYMPTOMS: "Do you have any other symptoms? (e.g., dizziness, runny nose, cough, chest pain, fever)     Cough 10. O2 SATURATION MONITOR:  "Do you use an oxygen saturation monitor (pulse oximeter) at home?" If Yes, "What is your reading (oxygen level) today?" "What is your usual oxygen saturation reading?" (e.g., 95%)       87-91% 11. PREGNANCY: "Is there any chance you are pregnant?" "When was your last menstrual period?"       N/a 12. TRAVEL: "Have you traveled out of the country in the last month?" (e.g., travel history, exposures)       No  Protocols used: Breathing Difficulty-A-AH

## 2021-07-09 NOTE — ED Provider Notes (Signed)
°  Emergency Medicine Provider Triage Evaluation Note  Roger Rodriguez , a 80 y.o.male,  was evaluated in triage.  Pt complains of shortness of breath for 3 days.  Recently tested positive for COVID.  Brought in by EMS, 88% SPO2 on room air.  Patient is alert, but unable to communicate coherently at this time due to history of Parkinson's.   Review of Systems  Positive: Shortness of breath Negative: Denies fever, chest pain, vomiting  Physical Exam   Vitals:   07/09/21 1336  BP: 119/64  Pulse: 94  Resp: 20  Temp: 98.6 F (37 C)  SpO2: 94%   Gen:   Awake, no distress   Resp:  Labored breathing MSK:   Moves extremities without difficulty  Other:  Crackles in the lower lobes bilaterally.  Medical Decision Making  Given the patient's initial medical screening exam, the following diagnostic evaluation has been ordered. The patient will be placed in the appropriate treatment space, once one is available, to complete the evaluation and treatment. I have discussed the plan of care with the patient and I have advised the patient that an ED physician or mid-level practitioner will reevaluate their condition after the test results have been received, as the results may give them additional insight into the type of treatment they may need.    Diagnostics: Labs, EKG, CXR.  Treatments: [place], [place], [place] 07/09/21 1344    Vladimir Crofts, MD 07/09/21 1731

## 2021-07-09 NOTE — ED Triage Notes (Addendum)
Pt comes into the ED via EMS from home with hx of parkinson, wheel chair bound, c/o increased SOB over the past 3 days, tested positive for covid   Pt has a foley catheter in placed on arrival from home 88%RA, placed on 2L [place] 95% [id number] #[id number] 128/73 100-102SR

## 2021-07-09 NOTE — ED Provider Notes (Signed)
[facility] Emergency Department Provider Note  ____________________________________________   Event Date/Time   First MD Initiated Contact with Patient 07/09/21 1906     (approximate)  I have reviewed the triage vital signs and the nursing notes.   HISTORY  Chief Complaint Covid Positive and Shortness of Breath    HPI Roger Rodriguez is a 80 y.o. male  with h/o Parkinson's, DM, HTN, HLD, here with SOB, weakness. Pt was diagnosed with COVID about 3 days ago. He has had progressively worsening cough, weakness, fatigue since then. He has had a difficult time even waking up/eating today which is new. He was started on Paxlovid by PCP, today is day 2 of his course. Reports fatigue, SOB. He feels better after EMS applied O2 today - he denies h/o lung issues or hypoxia. No abd pain, n/v/d but has had decreased appetite. He has sick contacts from [place]. No other complaints. He is fully vaccinated with booster.        Past Medical History:  Diagnosis Date   Allergy    allergic rhinitis   Arthritis    OA   Chronic kidney disease    stones   Depression    Diabetes mellitus without complication ([place])    Family history of adverse reaction to anesthesia    Hematuria    History of kidney stones    Hyperlipidemia    Hypertension    no meds since parkinsons meds started   Incontinence of urine    Inguinal hernia    Neuromuscular disorder ([place])    parkinsons   Overactive bladder    Parkinson disease ([place])    Parkinson's disease ([place])    Prostate cancer ([place])    Rhinitis, allergic    Shortness of breath dyspnea    doe secondary to parkinsons    Patient Active Problem List   Diagnosis Date Noted   Acute hypoxemic respiratory failure due to COVID-19 ([place]) 07/09/2021   Abnormal gait 01/12/2020   Knee stiff 01/12/2020   S/P revision of total knee, 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   Prosthetic joint infection  ([place]) 03/16/2018   Infection or inflammatory reaction due to internal joint prosthesis ([place]) 03/16/2018   Chronic venous insufficiency 11/22/2017   ARF (acute renal failure) ([place]) 10/12/2017   RUQ pain 10/22/2016   Depression, major, single episode, moderate ([place]) 10/22/2016   Renal cyst, right 09/28/2016   Hepatic steatosis 09/28/2016   Parkinson's disease ([place]) 09/18/2015   Restless leg 09/18/2015   Malignant neoplasm of prostate ([place]) 05/22/2015   Allergic rhinitis 04/02/2015   Arthritis 04/02/2015   Narrowing of intervertebral disc space 04/02/2015   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   Essential (primary) hypertension 04/02/2015   Borderline diabetes 04/02/2015   Hyperlipidemia 04/02/2015   BP (high blood pressure) 04/02/2015   Infected sebaceous cyst 04/02/2015   Hernia, inguinal 04/02/2015   Knee pain 04/02/2015   Lumbar canal stenosis 04/02/2015   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   Malignant melanoma ([place]) 04/02/2015   Arthritis, degenerative 04/02/2015   Fatigue 04/02/2015   Idiopathic Parkinson's disease ([place]) 04/02/2015   Plantar fasciitis 04/02/2015   CA of prostate ([place]) 04/02/2015   Benign essential tremor 04/02/2015   Lower urinary tract infection 04/02/2015   Degeneration of intervertebral disc of lumbosacral region 05/10/2012    Past Surgical History:  Procedure Laterality Date   BACK SURGERY  2014   CYSTOSCOPY WITH STENT PLACEMENT Right 01/13/2016  Procedure: CYSTOSCOPY WITH STENT PLACEMENT;  Surgeon: Hollice Espy, MD;  Location: [facility] ORS;  Service: Urology;  Laterality: Right;   CYSTOSCOPY/URETEROSCOPY/HOLMIUM LASER/STENT PLACEMENT Left 11/20/2015   Procedure: CYSTOSCOPY/URETEROSCOPY/HOLMIUM LASER/STENT PLACEMENT;  Surgeon: Hollice Espy, MD;  Location: [facility] ORS;  Service: Urology;  Laterality: Left;   EYE SURGERY     FRACTURE SURGERY      ankle    H/O arthroscopy of right knee  2015   H/O radical protatectomy     HERNIA REPAIR      JOINT REPLACEMENT     right knee   LITHOTRIPSY  2010   Rotator Cuff tear  1993   Repaired   TOTAL KNEE REVISION Right 03/17/2018   Procedure: TOTAL KNEE REVISION;  Surgeon: Lovell Sheehan, MD;  Location: [facility] ORS;  Service: Orthopedics;  Laterality: Right;   TOTAL KNEE REVISION Right 06/08/2018   Procedure: TOTAL KNEE REVISION;  Surgeon: Lovell Sheehan, MD;  Location: [facility] ORS;  Service: Orthopedics;  Laterality: Right;   URETEROSCOPY WITH HOLMIUM LASER LITHOTRIPSY  2002?   URETEROSCOPY WITH HOLMIUM LASER LITHOTRIPSY Right 01/13/2016   Procedure: URETEROSCOPY WITH HOLMIUM LASER LITHOTRIPSY;  Surgeon: Hollice Espy, MD;  Location: [facility] ORS;  Service: Urology;  Laterality: Right;   VASECTOMY     History    Prior to Admission medications   Medication Sig Start Date End Date Taking? Authorizing Provider  amantadine (SYMMETREL) 100 MG capsule Take 100 mg by mouth daily as needed. 04/13/21   [provider]  budesonide (RHINOCORT AQUA) 32 MCG/ACT nasal spray USE 1 PUFF IN EACH NOSTRIL EVERY DAY AS NEEDED Patient taking differently: Place 1 spray into both nostrils daily as needed for allergies. 02/07/18   [name]., MD  busPIRone (BUSPAR) 7.5 MG tablet Take 7.5 mg by mouth 2 (two) times daily. To 3 times daily 09/07/19   [provider]  carbidopa-levodopa (SINEMET CR) 50-200 MG tablet Take 1 tablet by mouth at bedtime. 11/26/20 11/26/21  [provider]  carbidopa-levodopa (SINEMET IR) 25-250 MG tablet Take by mouth. 11/26/20 11/26/21  [provider]  Carbidopa-Levodopa ER 23.75-95 MG CPCR Take 1 capsule by mouth 4 (four) times daily. 03/14/21   [provider]  celecoxib (CELEBREX) 100 MG capsule TAKE 1 CAPSULE BY MOUTH TWICE A DAY 05/14/21   [name]., MD  cholecalciferol (VITAMIN D) 1000 units tablet Take 1,000 Units by mouth daily.  05/19/12   [provider]  enalapril (VASOTEC) 2.5 MG tablet Take 1 tablet (2.5 mg total)  by mouth 2 (two) times daily. 02/19/21   [name]., MD  gabapentin (NEURONTIN) 100 MG capsule Take 200 mg by mouth 3 (three) times daily. 01/08/20   [provider]  glucose blood (QUINTET AC BLOOD GLUCOSE TEST) test strip Use as instructed Patient not taking: Reported on 05/28/2021 08/15/18   [name]., MD  GOCOVRI 137 MG CP24 Take by mouth. Patient not taking: Reported on 05/28/2021 07/16/20   [provider]  hydrocortisone 2.5 % lotion Apply to the eyebrows, mid face, and beard area QHS on Tuesday, Thursday, and Saturday. 05/05/21   Ralene Bathe, MD  Hypromellose (ARTIFICIAL TEARS OP) Place 1 drop into both eyes 4 (four) times daily as needed (for dry eyes).     [provider]  insulin aspart (NOVOLOG) 100 UNIT/ML injection Inject 0-15 Units into the skin 3 (three) times daily with meals. Patient not taking: Reported on 05/28/2021 06/10/18   Sabra Heck,  Nadara Mustard, MD  ketoconazole (NIZORAL) 2 % cream Apply 1 application topically 2 (two) times daily. 01/30/21   Debroah Loop, PA-C  ketoconazole (NIZORAL) 2 % cream Apply to the eyebrows, mid face, and beard area QHS on Monday, Wednesday, and Friday. 05/05/21   Ralene Bathe, MD  loratadine (CLARITIN) 10 MG tablet Take 10 mg by mouth daily. 05/19/12   [provider]  metFORMIN (GLUCOPHAGE) 500 MG tablet TAKE 1 TABLET BY MOUTH 2 TIMES DAILY WITH A MEAL. 05/23/21   [name]., MD  mirabegron ER (MYRBETRIQ) 25 MG TB24 tablet Take 1 tablet (25 mg total) by mouth daily. Patient not taking: No sig reported 04/11/20   Debroah Loop, PA-C  mometasone (ELOCON) 0.1 % cream Apply to scaly rash area on neck once to twice daily as needed. Avoid the face, groin, and axilla. 05/05/21   Ralene Bathe, MD  Multiple Vitamin (MULTIVITAMIN WITH MINERALS) TABS tablet Take 1 tablet by mouth daily.    [provider]  nirmatrelvir & ritonavir (PAXLOVID, 300/100,) 20 x  150 MG & 10 x 100MG  TBPK Take 3 tablets by mouth twice a day 07/08/21   Juanito Doom, MD  nitrofurantoin, macrocrystal-monohydrate, (MACROBID) 100 MG capsule Take 1 capsule (100 mg total) by mouth every 12 (twelve) hours. Patient not taking: Reported on 05/28/2021 05/14/21   Zara Council A, PA-C  NOURIANZ 20 MG TABS Take 1 tablet by mouth at bedtime. Patient not taking: No sig reported 04/10/20   [provider]  polyethylene glycol (MIRALAX / GLYCOLAX) packet Take 17 g by mouth daily as needed.     [provider]  pramipexole (MIRAPEX) 0.5 MG tablet Take 2 tablets (1 mg total) by mouth at bedtime. Patient not taking: No sig reported 10/17/17   Bettey Costa, MD  QUEtiapine (SEROQUEL) 25 MG tablet Take 25 mg by mouth at bedtime. 08/10/20   [provider]  RYTARY 23.75-95 MG CPCR Take 2 capsules by mouth 2 (two) times daily. 06/08/21   [provider]  RYTARY 61.25-245 MG CPCR Take 1 capsule by mouth 4 (four) times daily. 10/20/19   [provider]  sertraline (ZOLOFT) 25 MG tablet Take 25 mg by mouth daily. 12/03/19   [provider]  vitamin B-12 (CYANOCOBALAMIN) 1000 MCG tablet Take 1,000 mcg by mouth daily.    [provider]  vitamin C (ASCORBIC ACID) 500 MG tablet Take 1,000 mg by mouth daily.     [provider]  vitamin E 180 MG (400 UNITS) capsule Take 400 Units by mouth daily.     [provider]    Allergies Sulfa antibiotics  Family History  Problem Relation Age of Onset   Dementia Mother    Cerebral aneurysm Father    Bladder Cancer Paternal Uncle    Liver cancer Sister    Prostate cancer Neg Hx    Kidney cancer Neg Hx     Social History Social History   Tobacco Use   Smoking status: Former    Types: Cigars    Quit date: 11/11/1968    Years since quitting: 52.6   Smokeless tobacco: Former    Types: Chew    Quit date: 11/12/2003   Tobacco comments:    Occassionally, also chewed tobacco  till 2005. Quit smoking 1973  Vaping Use   Vaping Use: Never used  Substance Use Topics   Alcohol use: No    Alcohol/week: 0.0 standard drinks   Drug use: No  Review of Systems  Review of Systems  Constitutional:  Positive for appetite change and fatigue. Negative for chills and fever.  HENT:  Negative for sore throat.   Respiratory:  Positive for cough and shortness of breath.   Cardiovascular:  Negative for chest pain.  Gastrointestinal:  Negative for abdominal pain.  Genitourinary:  Negative for flank pain.  Musculoskeletal:  Negative for neck pain.  Skin:  Negative for rash and wound.  Allergic/Immunologic: Negative for immunocompromised state.  Neurological:  Positive for weakness. Negative for numbness.  Hematological:  Does not bruise/bleed easily.  All other systems reviewed and are negative.   ____________________________________________  PHYSICAL EXAM:      VITAL SIGNS: ED Triage Vitals  Enc Vitals Group     BP 07/09/21 1336 119/64     Pulse Rate 07/09/21 1336 94     Resp 07/09/21 1336 20     Temp 07/09/21 1336 98.6 F (37 C)     Temp Source 07/09/21 1336 Oral     SpO2 07/09/21 1336 94 %     Weight 07/09/21 1337 165 lb (74.8 kg)     Height 07/09/21 1337 6' (1.829 m)     Head Circumference --      Peak Flow --      Pain Score 07/09/21 1337 0     Pain Loc --      Pain Edu? --      Excl. in [place]? --      Physical Exam Vitals and nursing note reviewed.  Constitutional:      General: He is not in acute distress.    Appearance: He is well-developed.  HENT:     Head: Normocephalic and atraumatic.  Eyes:     Conjunctiva/sclera: Conjunctivae normal.  Cardiovascular:     Rate and Rhythm: Normal rate and regular rhythm.     Heart sounds: Normal heart sounds.  Pulmonary:     Effort: Pulmonary effort is normal. No respiratory distress.     Breath sounds: Examination of the right-middle field reveals rales. Examination of the left-middle field reveals rales.  Examination of the right-lower field reveals rales. Examination of the left-lower field reveals rales. Decreased breath sounds and rales present. No wheezing.  Abdominal:     General: There is no distension.  Musculoskeletal:     Cervical back: Neck supple.  Skin:    General: Skin is warm.     Capillary Refill: Capillary refill takes less than 2 seconds.     Findings: No rash.  Neurological:     Mental Status: He is alert.     Motor: No abnormal muscle tone.     Comments: Flat affect. Intention tremor which is at baseline per family. Mild cogwheeling.      ____________________________________________   LABS (all labs ordered are listed, but only abnormal results are displayed)  Labs Reviewed  COMPREHENSIVE METABOLIC PANEL - Abnormal; Notable for the following components:      Result Value   Sodium 132 (*)    Glucose, Bld 143 (*)    BUN 33 (*)    Calcium 8.5 (*)    Albumin 3.4 (*)    AST 13 (*)    All other components within normal limits  D-DIMER, QUANTITATIVE - Abnormal; Notable for the following components:   D-Dimer, Quant 1.20 (*)    All other components within normal limits  BRAIN NATRIURETIC PEPTIDE - Abnormal; Notable for the following components:   B Natriuretic Peptide 142.2 (*)  All other components within normal limits  RESP PANEL BY RT-PCR (FLU A&B, COVID) ARPGX2  CBC WITH DIFFERENTIAL/PLATELET  LACTIC ACID, PLASMA  LACTIC ACID, PLASMA  PROCALCITONIN  CBC WITH DIFFERENTIAL/PLATELET  COMPREHENSIVE METABOLIC PANEL  C-REACTIVE PROTEIN  TROPONIN I (HIGH SENSITIVITY)    ____________________________________________  EKG: Sinus rhythm, VR 91. PR 218, QRS 98, QTc 428. No acute St elevations or depressions. No ischemia or infarct. ________________________________________  RADIOLOGY All imaging, including plain films, CT scans, and ultrasounds, independently reviewed by me, and interpretations confirmed via formal radiology reads.  ED MD interpretation:    CXR: New LUL and bilateral LL airspace disease c/w PNA  Official radiology report(s): DG Chest 2 View  Result Date: 07/09/2021 CLINICAL DATA:  Shortness of breath.  COVID. EXAM: CHEST - 2 VIEW COMPARISON:  One-view chest x-ray 10/15/2017 FINDINGS: Heart size is normal. Mild pulmonary vascular congestion is present. New left upper lobe and bilateral lower lobe airspace opacities are present. Lung volumes are low. Degenerative and postoperative changes are noted in the right shoulder. IMPRESSION: 1. New left upper lobe and bilateral lower lobe airspace disease compatible with pneumonia. This may be related to viral disease. 2. Low lung volumes and mild pulmonary vascular congestion. Electronically Signed   By: San Morelle M.D.   On: 07/09/2021 14:14    ____________________________________________  PROCEDURES   Procedure(s) performed (including Critical Care):  .Critical Care Performed by: Duffy Bruce, MD Authorized by: Duffy Bruce, MD   Critical care provider statement:    Critical care time (minutes):  30   Critical care was necessary to treat or prevent imminent or life-threatening deterioration of the following conditions:  Cardiac failure, circulatory failure and respiratory failure   Critical care was time spent personally by me on the following activities:  Development of treatment plan with patient or surrogate, discussions with consultants, evaluation of patient's response to treatment, examination of patient, ordering and review of laboratory studies, ordering and review of radiographic studies, ordering and performing treatments and interventions, pulse oximetry, re-evaluation of patient's condition and review of old charts  ____________________________________________  [place] / MDM / [place] / ED COURSE  As part of my medical decision making, I reviewed the following data within the [place] notes reviewed and  incorporated, Old chart reviewed, Notes from prior ED visits, and [place] Controlled Substance Database       *TREYSEN SUDBECK was evaluated in Emergency Department on 07/10/2021 for the symptoms described in the history of present illness. He was evaluated in the context of the global COVID-19 pandemic, which necessitated consideration that the patient might be at risk for infection with the SARS-CoV-2 virus that causes COVID-19. Institutional protocols and algorithms that pertain to the evaluation of patients at risk for COVID-19 are in a state of rapid change based on information released by regulatory bodies including the CDC and federal and state organizations. These policies and algorithms were followed during the patient's care in the ED.  Some ED evaluations and interventions may be delayed as a result of limited staffing during the pandemic.*     Medical Decision Making:  80 yo M with history as above here with cough, SOB, weakness. Pt is COVID-19 positive. CXR shows multifocal COVID PNA. He is hypoxic here, but improved on 2-3L [place]. WOB largely normal. Lab work reviewed, shows no signs to suggest sepsis or bacterial superinfection. No leukocytosis, LA is normal and procal is <0.25. EKG nonischemic. Given his hypoxia and abnormal  CXR, will start remdesevir IV and IV steroids, admit to medicine. Hold on abx at this time. Family at bedside, updated, and in agreement with this plan.  ____________________________________________  FINAL CLINICAL IMPRESSION(S) / ED DIAGNOSES  Final diagnoses:  COVID-19  Acute respiratory failure with hypoxemia ([place])     MEDICATIONS GIVEN DURING THIS VISIT:  Medications  remdesivir 200 mg in sodium chloride 0.9% 250 mL IVPB (0 mg Intravenous Stopped 07/10/21 0030)    Followed by  remdesivir 100 mg in sodium chloride 0.9 % 100 mL IVPB (has no administration in time range)  acetaminophen (TYLENOL) tablet 650 mg (has no administration in time range)  ondansetron  (ZOFRAN) tablet 4 mg (has no administration in time range)    Or  ondansetron (ZOFRAN) injection 4 mg (has no administration in time range)  enoxaparin (LOVENOX) injection 40 mg (has no administration in time range)  methylPREDNISolone sodium succinate (SOLU-MEDROL) 40 mg/mL injection 37.6 mg (has no administration in time range)    Followed by  predniSONE (DELTASONE) tablet 50 mg (has no administration in time range)  insulin aspart (novoLOG) injection 0-5 Units (has no administration in time range)  insulin aspart (novoLOG) injection 0-20 Units (has no administration in time range)  albuterol (VENTOLIN HFA) 108 (90 Base) MCG/ACT inhaler 2 puff (has no administration in time range)  methylPREDNISolone sodium succinate (SOLU-MEDROL) 125 mg/2 mL injection 80 mg (80 mg Intravenous Given 07/09/21 2038)     ED Discharge Orders     None        Note:  This document was prepared using Dragon voice recognition software and may include unintentional dictation errors.   Duffy Bruce, MD 07/10/21 [phone]

## 2021-07-09 NOTE — Progress Notes (Signed)
Remdesivir - Pharmacy Brief Note   80YOM cc SOB, tested positive for COVID at home.  O:  ALT: 7 CXR: New left upper lobe and bilateral lower lobe airspace disease compatible with pneumonia. SpO2: 92% on RA   A/P:  Remdesivir 200 mg IVPB once followed by 100 mg IVPB daily x 4 days.   Wynelle Cleveland, PharmD Pharmacy Resident  07/09/2021 7:38 PM

## 2021-07-09 NOTE — ED Provider Notes (Incomplete)
[facility] Emergency Department Provider Note  ____________________________________________   Event Date/Time   First MD Initiated Contact with Patient 07/09/21 1906     (approximate)  I have reviewed the triage vital signs and the nursing notes.   HISTORY  Chief Complaint Covid Positive and Shortness of Breath    HPI Roger Rodriguez is a 80 y.o. male  with         Past Medical History:  Diagnosis Date   Allergy    allergic rhinitis   Arthritis    OA   Chronic kidney disease    stones   Depression    Diabetes mellitus without complication ([place])    Family history of adverse reaction to anesthesia    Hematuria    History of kidney stones    Hyperlipidemia    Hypertension    no meds since parkinsons meds started   Incontinence of urine    Inguinal hernia    Neuromuscular disorder ([place])    parkinsons   Overactive bladder    Parkinson disease ([place])    Parkinson's disease ([place])    Prostate cancer ([place])    Rhinitis, allergic    Shortness of breath dyspnea    doe secondary to parkinsons    Patient Active Problem List   Diagnosis Date Noted   Abnormal gait 01/12/2020   Knee stiff 01/12/2020   S/P revision of total knee, 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   Prosthetic joint infection ([place]) 03/16/2018   Infection or inflammatory reaction due to internal joint prosthesis ([place]) 03/16/2018   Chronic venous insufficiency 11/22/2017   ARF (acute renal failure) ([place]) 10/12/2017   RUQ pain 10/22/2016   Depression, major, single episode, moderate ([place]) 10/22/2016   Renal cyst, right 09/28/2016   Hepatic steatosis 09/28/2016   Parkinson's disease ([place]) 09/18/2015   Restless leg 09/18/2015   Malignant neoplasm of prostate ([place]) 05/22/2015   Allergic rhinitis 04/02/2015   Arthritis 04/02/2015   Narrowing of intervertebral disc space 04/02/2015   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   Essential  (primary) hypertension 04/02/2015   Borderline diabetes 04/02/2015   Hyperlipidemia 04/02/2015   BP (high blood pressure) 04/02/2015   Infected sebaceous cyst 04/02/2015   Hernia, inguinal 04/02/2015   Knee pain 04/02/2015   Lumbar canal stenosis 04/02/2015   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   Malignant melanoma ([place]) 04/02/2015   Arthritis, degenerative 04/02/2015   Fatigue 04/02/2015   Idiopathic Parkinson's disease ([place]) 04/02/2015   Plantar fasciitis 04/02/2015   CA of prostate ([place]) 04/02/2015   Benign essential tremor 04/02/2015   Lower urinary tract infection 04/02/2015   Degeneration of intervertebral disc of lumbosacral region 05/10/2012    Past Surgical History:  Procedure Laterality Date   BACK SURGERY  2014   CYSTOSCOPY WITH STENT PLACEMENT Right 01/13/2016   Procedure: CYSTOSCOPY WITH STENT PLACEMENT;  Surgeon: Hollice Espy, MD;  Location: [facility] ORS;  Service: Urology;  Laterality: Right;   CYSTOSCOPY/URETEROSCOPY/HOLMIUM LASER/STENT PLACEMENT Left 11/20/2015   Procedure: CYSTOSCOPY/URETEROSCOPY/HOLMIUM LASER/STENT PLACEMENT;  Surgeon: Hollice Espy, MD;  Location: [facility] OR[facility];  Service: Urology;  Laterality: Left;   EYE SURGERY     FRACTURE SURGERY      ankle    H/O arthroscopy of right knee  2015   H/O radical protatectomy     HERNIA REPAIR     JOINT REPLACEMENT     right knee   LITHOTRIPSY  2010   Rotator Cuff tear  1993   Repaired   TOTAL KNEE REVISION Right 03/17/2018   Procedure: TOTAL KNEE REVISION;  Surgeon: Lovell Sheehan, MD;  Location: [facility] OR[facility];  Service: Orthopedics;  Laterality: Right;   TOTAL KNEE REVISION Right 06/08/2018   Procedure: TOTAL KNEE REVISION;  Surgeon: Lovell Sheehan, MD;  Location: [facility] OR[facility];  Service: Orthopedics;  Laterality: Right;   URETEROSCOPY WITH HOLMIUM LASER LITHOTRIPSY  2002?   URETEROSCOPY WITH HOLMIUM LASER LITHOTRIPSY Right 01/13/2016   Procedure: URETEROSCOPY WITH HOLMIUM LASER LITHOTRIPSY;  Surgeon: Hollice Espy, MD;  Location: [facility] OR[facility];  Service: Urology;  Laterality: Right;   VASECTOMY     History    Prior to Admission medications   Medication Sig Start Date End Date Taking? Authorizing Provider  amantadine (SYMMETREL) 100 MG capsule Take 100 mg by mouth daily as needed. 04/13/21   [provider]  budesonide (RHINOCORT AQUA) 32 MCG/ACT nasal spray USE 1 PUFF IN EACH NOSTRIL EVERY DAY AS NEEDED Patient taking differently: Place 1 spray into both nostrils daily as needed for allergies. 02/07/18   [name]., MD  busPIRone (BUSPAR) 7.5 MG tablet Take 7.5 mg by mouth 2 (two) times daily. To 3 times daily 09/07/19   [provider]  carbidopa-levodopa (SINEMET CR) 50-200 MG tablet Take 1 tablet by mouth at bedtime. 11/26/20 11/26/21  [provider]  carbidopa-levodopa (SINEMET CR) 50-200 MG tablet Take 1 tablet by mouth at bedtime. 05/22/21   [provider]  carbidopa-levodopa (SINEMET IR) 25-250 MG tablet Take by mouth. 11/26/20 11/26/21  [provider]  Carbidopa-Levodopa ER 23.75-95 MG CPCR Take by mouth. 03/14/21   [provider]  celecoxib (CELEBREX) 100 MG capsule TAKE 1 CAPSULE BY MOUTH TWICE A DAY 05/14/21   [name]., MD  cholecalciferol (VITAMIN D) 1000 units tablet Take 1,000 Units by mouth daily.  05/19/12   [provider]  enalapril (VASOTEC) 2.5 MG tablet Take 1 tablet (2.5 mg total) by mouth 2 (two) times daily. 02/19/21   [name]., MD  gabapentin (NEURONTIN) 100 MG capsule Take 200 mg by mouth 3 (three) times daily. 01/08/20   [provider]  glucose blood (QUINTET AC BLOOD GLUCOSE TEST) test strip Use as instructed Patient not taking: Reported on 05/28/2021 08/15/18   [name]., MD  GOCOVRI 137 MG CP24 Take by mouth. Patient not taking: Reported on 05/28/2021 07/16/20   [provider]  hydrocortisone 2.5 % lotion Apply to the eyebrows, mid face, and beard  area QHS on Tuesday, Thursday, and Saturday. 05/05/21   Ralene Bathe, MD  Hypromellose (ARTIFICIAL TEARS OP) Place 1 drop into both eyes 4 (four) times daily as needed (for dry eyes).     [provider]  insulin aspart (NOVOLOG) 100 UNIT/ML injection Inject 0-15 Units into the skin 3 (three) times daily with meals. Patient not taking: Reported on 05/28/2021 06/10/18   [name], MD  ketoconazole (NIZORAL) 2 % cream Apply 1 application topically 2 (two) times daily. 01/30/21   Debroah Loop, PA-C  ketoconazole (NIZORAL) 2 % cream Apply to the eyebrows, mid face, and beard area QHS on Monday, Wednesday, and Friday. 05/05/21   Ralene Bathe, MD  loratadine (CLARITIN) 10 MG tablet Take 10 mg by mouth daily. 05/19/12   [provider]  metFORMIN (GLUCOPHAGE) 500 MG tablet TAKE 1 TABLET BY MOUTH 2 TIMES DAILY WITH A MEAL. 05/23/21   [name]., MD  mirabegron ER (MYRBETRIQ) 25 MG TB24 tablet Take 1 tablet (25 mg total) by mouth daily. Patient not taking: No sig reported 04/11/20   Debroah Loop, PA-C  mometasone (ELOCON) 0.1 % cream Apply to scaly rash area on neck once to twice daily as needed. Avoid the face, groin, and axilla. 05/05/21   Ralene Bathe, MD  Multiple Vitamin (MULTIVITAMIN WITH MINERALS) TABS tablet Take 1 tablet by mouth daily.    [provider]  nirmatrelvir & ritonavir (PAXLOVID, 300/100,) 20 x 150 MG & 10 x 100MG  TBPK Take 3 tablets by mouth twice a day 07/08/21   Juanito Doom, MD  nitrofurantoin, macrocrystal-monohydrate, (MACROBID) 100 MG capsule Take 1 capsule (100 mg total) by mouth every 12 (twelve) hours. Patient not taking: Reported on 05/28/2021 05/14/21   Zara Council A, PA-C  NOURIANZ 20 MG TABS Take 1 tablet by mouth at bedtime. Patient not taking: No sig reported 04/10/20   [provider]  polyethylene glycol (MIRALAX / GLYCOLAX) packet Take 17 g by mouth daily as needed.      [provider]  pramipexole (MIRAPEX) 0.5 MG tablet Take 2 tablets (1 mg total) by mouth at bedtime. Patient not taking: No sig reported 10/17/17   Bettey Costa, MD  QUEtiapine (SEROQUEL) 25 MG tablet Take 25 mg by mouth at bedtime. 08/10/20   [provider]  RYTARY 23.75-95 MG CPCR Take 2 capsules by mouth 2 (two) times daily. 06/08/21   [provider]  RYTARY 61.25-245 MG CPCR Take 1 capsule by mouth 4 (four) times daily. 10/20/19   [provider]  sertraline (ZOLOFT) 25 MG tablet Take 25 mg by mouth daily. 12/03/19   [provider]  vitamin B-12 (CYANOCOBALAMIN) 1000 MCG tablet Take 1,000 mcg by mouth daily.    [provider]  vitamin C (ASCORBIC ACID) 500 MG tablet Take 1,000 mg by mouth daily.     [provider]  vitamin E 180 MG (400 UNITS) capsule Take 400 Units by mouth daily.     [provider]    Allergies Sulfa antibiotics  Family History  Problem Relation Age of Onset   Dementia Mother    Cerebral aneurysm Father    Bladder Cancer Paternal Uncle    Liver cancer Sister    Prostate cancer Neg Hx    Kidney cancer Neg Hx     Social History Social History   Tobacco Use   Smoking status: Former    Types: Cigars    Quit date: 11/11/1968    Years since quitting: 52.6   Smokeless tobacco: Former    Types: Chew    Quit date: 11/12/2003   Tobacco comments:    Occassionally, also chewed tobacco till 2005. Quit smoking 1973  Vaping Use   Vaping Use: Never used  Substance Use Topics   Alcohol use: No    Alcohol/week: 0.0 standard drinks   Drug use: No    Review of Systems  Review of Systems   ____________________________________________  PHYSICAL EXAM:      VITAL SIGNS: ED Triage Vitals  Enc Vitals Group     BP 07/09/21 1336 119/64     Pulse Rate 07/09/21 1336 94     Resp 07/09/21 1336 20     Temp 07/09/21 1336 98.6 F (37 C)     Temp Source 07/09/21 1336 Oral     SpO2 07/09/21 1336 94 %      Weight 07/09/21 1337 165 lb (74.8 kg)  Height 07/09/21 1337 6' (1.829 m)     Head Circumference --      Peak Flow --      Pain Score 07/09/21 1337 0     Pain Loc --      Pain Edu? --      Excl. in [place]? --      Physical Exam    ____________________________________________   LABS (all labs ordered are listed, but only abnormal results are displayed)  Labs Reviewed  COMPREHENSIVE METABOLIC PANEL - Abnormal; Notable for the following components:      Result Value   Sodium 132 (*)    Glucose, Bld 143 (*)    BUN 33 (*)    Calcium 8.5 (*)    Albumin 3.4 (*)    AST 13 (*)    All other components within normal limits  CBC WITH DIFFERENTIAL/PLATELET  LACTIC ACID, PLASMA  PROCALCITONIN  LACTIC ACID, PLASMA  D-DIMER, QUANTITATIVE  BRAIN NATRIURETIC PEPTIDE  TROPONIN I (HIGH SENSITIVITY)    ____________________________________________  EKG: *** ________________________________________  RADIOLOGY All imaging, including plain films, CT scans, and ultrasounds, independently reviewed by me, and interpretations confirmed via formal radiology reads.  ED MD interpretation:   ***  Official radiology report(s): DG Chest 2 View  Result Date: 07/09/2021 CLINICAL DATA:  Shortness of breath.  COVID. EXAM: CHEST - 2 VIEW COMPARISON:  One-view chest x-ray 10/15/2017 FINDINGS: Heart size is normal. Mild pulmonary vascular congestion is present. New left upper lobe and bilateral lower lobe airspace opacities are present. Lung volumes are low. Degenerative and postoperative changes are noted in the right shoulder. IMPRESSION: 1. New left upper lobe and bilateral lower lobe airspace disease compatible with pneumonia. This may be related to viral disease. 2. Low lung volumes and mild pulmonary vascular congestion. Electronically Signed   By: San Morelle M.D.   On: 07/09/2021 14:14    ____________________________________________  PROCEDURES   Procedure(s) performed  (including Critical Care):  Procedures  ____________________________________________  INITIAL IMPRESSION / MDM / [place] / ED COURSE  As part of my medical decision making, I reviewed the following data within the [facility] notes reviewed and incorporated, Old chart reviewed, Notes from prior ED visits, and  Controlled Substance Database       *Roger Rodriguez was evaluated in Emergency Department on 07/09/2021 for the symptoms described in the history of present illness. He was evaluated in the context of the global COVID-19 pandemic, which necessitated consideration that the patient might be at risk for infection with the SARS-CoV-2 virus that causes COVID-19. Institutional protocols and algorithms that pertain to the evaluation of patients at risk for COVID-19 are in a state of rapid change based on information released by regulatory bodies including the CDC and federal and state organizations. These policies and algorithms were followed during the patient's care in the ED.  Some ED evaluations and interventions may be delayed as a result of limited staffing during the pandemic.*     Medical Decision Making:  ***  ____________________________________________  FINAL CLINICAL IMPRESSION(S) / ED DIAGNOSES  Final diagnoses:  None     MEDICATIONS GIVEN DURING THIS VISIT:  Medications  methylPREDNISolone sodium succinate (SOLU-MEDROL) 125 mg/2 mL injection 80 mg (has no administration in time range)  remdesivir 200 mg in sodium chloride 0.9% 250 mL IVPB (has no administration in time range)    Followed by  remdesivir 100 mg in sodium chloride 0.9 % 100 mL IVPB (has no  administration in time range)     ED Discharge Orders     None        Note:  This document was prepared using Dragon voice recognition software and may include unintentional dictation errors.

## 2021-07-09 NOTE — H&P (Addendum)
History and Physical   JAYKWON MORONES [id number]:[id number] DOB: [birth year] DOA: 07/09/2021  PCP: [name]., MD  Outpatient Specialists: Dr. Manuella Ghazi, neurology Patient coming from: Home via EMS  I have personally briefly reviewed patient's old medical records in [place].  Chief Concern: Shortness of breath  HPI: MUSAB WINGARD is a 80 y.o. male with medical history significant for chronic suprapubic catheter, status post changed on 06/25/2021 outpatient, hypertension, history of depression, non-insulin-dependent diabetes mellitus, idiopathic Parkinson's disease, hepatic steatosis, history of infection of prosthetic joints, restless leg syndrome, pure hypercholesterolemia, who presents emergency department from home for chief concerns of shortness of breath.  At bedside, patient was able to whisper/mumble his name, age. He has a flat affect. He shakes his head, indicating he is not in any pain.  He responds mumbling and difficult to understand.  Social history: Unknown  ROS: Unable to complete due to patient mumbling  ED Course: Discussed with emergency medicine provider, patient requiring hospitalization for chief concerns of acute mild hypoxia.  Vitals in the emergency department showed temperature of 98.6, respiration rate of 20, heart rate of 94, blood pressure 119/64, SPO2 of 87-88% on room air.  Labs in the emergency department was remarkable for serum sodium 132, potassium 3.9, chloride 98, bicarb of 26, BUN of 33, serum creatinine of 1.04, nonfasting blood glucose 143.  GFR was greater than 60.  WBC was 8, hemoglobin 13.9, platelets of 156.  High sensitive troponin was 12.  Lactic acid was 1.0, procalcitonin was 0.16.  In the emergency department patient was started on remdesivir IV, Solu-Medrol 80 mg IV once.  ED provider placed patient on 2 L nasal cannula with patient's O2 saturation to 92%.  Assessment/Plan  Principal Problem:   Acute hypoxemic respiratory  failure due to COVID-19 [facility]) Active Problems:   Arthritis   Essential (primary) hypertension   Hyperlipidemia   Lumbar canal stenosis   Benign essential tremor   Parkinson's disease (HCC)   Depression, major, single episode, moderate ([place])   # Acute hypoxemic respiratory failure secondary to COVID-19 infection - IV remdesivir per EDP order - Incentive spirometry and flutter valve for 10 reps every 2 hours while awake - Albuterol inhaler 2 puffs every 4 hours while awake - Daily labs: CMP, CBC, CRP - Supplemental oxygen to maintain SPO2 goal of greater than 92% - Airborne and contact precautions  # Parkinson's disease- - Patient was prescribed carbidopa/levodopa 25/250 mg 4 times daily, carbidopa/levodopa (Sinemet CR) 50/200 mg nightly resumed based on neurology outpatient note on 11/26/20 -Amantadine 100 mg daily  # Parkinson's disease dementia-as above  # Anxiety/depression-sertraline daily # Restless leg syndrome-gabapentin 200 mg 3 times daily  # Non-insulin-dependent diabetes mellitus-metformin 500 mg p.o. twice daily resume - insulin SSI with at bedtime coverage ordered - Goal inpatient blood glucose is 140-180  # History of hypertension-enalapril 2.5 mg p.o. twice daily resumed # Insomnia-quetiapine 25 mg nightly resumed  Chart reviewed.   DVT prophylaxis: Enoxaparin 40 mg subcutaneous every 24 hours Code Status: Full code Diet: Heart healthy/carb modified Family Communication: No Disposition Plan: Clinical course Consults called: None at this time Admission status: Observation, telemetry medical  Past Medical History:  Diagnosis Date   Allergy    allergic rhinitis   Arthritis    OA   Chronic kidney disease    stones   Depression    Diabetes mellitus without complication ([place])    Family history of adverse reaction to anesthesia  Hematuria    History of kidney stones    Hyperlipidemia    Hypertension    no meds since parkinsons meds started    Incontinence of urine    Inguinal hernia    Neuromuscular disorder (HCC)    parkinsons   Overactive bladder    Parkinson disease (HCC)    Parkinson's disease ([place])    Prostate cancer ([place])    Rhinitis, allergic    Shortness of breath dyspnea    doe secondary to parkinsons   Past Surgical History:  Procedure Laterality Date   BACK SURGERY  2014   CYSTOSCOPY WITH STENT PLACEMENT Right 01/13/2016   Procedure: CYSTOSCOPY WITH STENT PLACEMENT;  Surgeon: Hollice Espy, MD;  Location: [facility] ORS;  Service: Urology;  Laterality: Right;   CYSTOSCOPY/URETEROSCOPY/HOLMIUM LASER/STENT PLACEMENT Left 11/20/2015   Procedure: CYSTOSCOPY/URETEROSCOPY/HOLMIUM LASER/STENT PLACEMENT;  Surgeon: Hollice Espy, MD;  Location: [facility] ORS;  Service: Urology;  Laterality: Left;   EYE SURGERY     FRACTURE SURGERY      ankle    H/O arthroscopy of right knee  2015   H/O radical protatectomy     HERNIA REPAIR     JOINT REPLACEMENT     right knee   LITHOTRIPSY  2010   Rotator Cuff tear  1993   Repaired   TOTAL KNEE REVISION Right 03/17/2018   Procedure: TOTAL KNEE REVISION;  Surgeon: Lovell Sheehan, MD;  Location: [facility] ORS;  Service: Orthopedics;  Laterality: Right;   TOTAL KNEE REVISION Right 06/08/2018   Procedure: TOTAL KNEE REVISION;  Surgeon: Lovell Sheehan, MD;  Location: [facility] ORS;  Service: Orthopedics;  Laterality: Right;   URETEROSCOPY WITH HOLMIUM LASER LITHOTRIPSY  2002?   URETEROSCOPY WITH HOLMIUM LASER LITHOTRIPSY Right 01/13/2016   Procedure: URETEROSCOPY WITH HOLMIUM LASER LITHOTRIPSY;  Surgeon: Hollice Espy, MD;  Location: [facility] ORS;  Service: Urology;  Laterality: Right;   VASECTOMY     History   Social History:  reports that he quit smoking about 52 years ago. His smoking use included cigars. He quit smokeless tobacco use about 17 years ago.  His smokeless tobacco use included chew. He reports that he does not drink alcohol and does not use drugs.  Allergies  Allergen Reactions   Sulfa  Antibiotics Swelling    Lip swelling   Family History  Problem Relation Age of Onset   Dementia Mother    Cerebral aneurysm Father    Bladder Cancer Paternal Uncle    Liver cancer Sister    Prostate cancer Neg Hx    Kidney cancer Neg Hx    Family history: Family history reviewed and not pertinent  Prior to Admission medications   Medication Sig Start Date End Date Taking? Authorizing Provider  amantadine (SYMMETREL) 100 MG capsule Take 100 mg by mouth daily as needed. 04/13/21   [provider]  budesonide (RHINOCORT AQUA) 32 MCG/ACT nasal spray USE 1 PUFF IN EACH NOSTRIL EVERY DAY AS NEEDED Patient taking differently: Place 1 spray into both nostrils daily as needed for allergies. 02/07/18   [name]., MD  busPIRone (BUSPAR) 7.5 MG tablet Take 7.5 mg by mouth 2 (two) times daily. To 3 times daily 09/07/19   [provider]  carbidopa-levodopa (SINEMET CR) 50-200 MG tablet Take 1 tablet by mouth at bedtime. 11/26/20 11/26/21  [provider]  carbidopa-levodopa (SINEMET CR) 50-200 MG tablet Take 1 tablet by mouth at bedtime. 05/22/21   [provider]  carbidopa-levodopa (SINEMET IR) 25-250 MG  tablet Take by mouth. 11/26/20 11/26/21  [provider]  Carbidopa-Levodopa ER 23.75-95 MG CPCR Take by mouth. 03/14/21   [provider]  celecoxib (CELEBREX) 100 MG capsule TAKE 1 CAPSULE BY MOUTH TWICE A DAY 05/14/21   [name]., MD  cholecalciferol (VITAMIN D) 1000 units tablet Take 1,000 Units by mouth daily.  05/19/12   [provider]  enalapril (VASOTEC) 2.5 MG tablet Take 1 tablet (2.5 mg total) by mouth 2 (two) times daily. 02/19/21   [name]., MD  gabapentin (NEURONTIN) 100 MG capsule Take 200 mg by mouth 3 (three) times daily. 01/08/20   [provider]  glucose blood (QUINTET AC BLOOD GLUCOSE TEST) test strip Use as instructed Patient not taking: Reported on 05/28/2021 08/15/18   [name]., MD  GOCOVRI 137 MG CP24 Take by mouth. Patient not taking: Reported on 05/28/2021 07/16/20   [provider]  hydrocortisone 2.5 % lotion Apply to the eyebrows, mid face, and beard area QHS on Tuesday, Thursday, and Saturday. 05/05/21   Ralene Bathe, MD  Hypromellose (ARTIFICIAL TEARS OP) Place 1 drop into both eyes 4 (four) times daily as needed (for dry eyes).     [provider]  insulin aspart (NOVOLOG) 100 UNIT/ML injection Inject 0-15 Units into the skin 3 (three) times daily with meals. Patient not taking: Reported on 05/28/2021 06/10/18   [name], MD  ketoconazole (NIZORAL) 2 % cream Apply 1 application topically 2 (two) times daily. 01/30/21   Debroah Loop, PA-C  ketoconazole (NIZORAL) 2 % cream Apply to the eyebrows, mid face, and beard area QHS on Monday, Wednesday, and Friday. 05/05/21   Ralene Bathe, MD  loratadine (CLARITIN) 10 MG tablet Take 10 mg by mouth daily. 05/19/12   [provider]  metFORMIN (GLUCOPHAGE) 500 MG tablet TAKE 1 TABLET BY MOUTH 2 TIMES DAILY WITH A MEAL. 05/23/21   [name]., MD  mirabegron ER (MYRBETRIQ) 25 MG TB24 tablet Take 1 tablet (25 mg total) by mouth daily. Patient not taking: No sig reported 04/11/20   Debroah Loop, PA-C  mometasone (ELOCON) 0.1 % cream Apply to scaly rash area on neck once to twice daily as needed. Avoid the face, groin, and axilla. 05/05/21   Ralene Bathe, MD  Multiple Vitamin (MULTIVITAMIN WITH MINERALS) TABS tablet Take 1 tablet by mouth daily.    [provider]  nirmatrelvir & ritonavir (PAXLOVID, 300/100,) 20 x 150 MG & 10 x 100MG  TBPK Take 3 tablets by mouth twice a day 07/08/21   Juanito Doom, MD  nitrofurantoin, macrocrystal-monohydrate, (MACROBID) 100 MG capsule Take 1 capsule (100 mg total) by mouth every 12 (twelve) hours. Patient not taking: Reported on 05/28/2021 05/14/21   Zara Council A, PA-C  NOURIANZ 20 MG  TABS Take 1 tablet by mouth at bedtime. Patient not taking: No sig reported 04/10/20   [provider]  polyethylene glycol (MIRALAX / GLYCOLAX) packet Take 17 g by mouth daily as needed.     [provider]  pramipexole (MIRAPEX) 0.5 MG tablet Take 2 tablets (1 mg total) by mouth at bedtime. Patient not taking: No sig reported 10/17/17   Bettey Costa, MD  QUEtiapine (SEROQUEL) 25 MG tablet Take 25 mg by mouth at bedtime. 08/10/20   [provider]  RYTARY 23.75-95 MG CPCR Take 2 capsules by mouth 2 (two) times daily. 06/08/21   [provider]  [place] 61.25-245 MG CPCR  Take 1 capsule by mouth 4 (four) times daily. 10/20/19   [provider]  sertraline (ZOLOFT) 25 MG tablet Take 25 mg by mouth daily. 12/03/19   [provider]  vitamin B-12 (CYANOCOBALAMIN) 1000 MCG tablet Take 1,000 mcg by mouth daily.    [provider]  vitamin C (ASCORBIC ACID) 500 MG tablet Take 1,000 mg by mouth daily.     [provider]  vitamin E 180 MG (400 UNITS) capsule Take 400 Units by mouth daily.     [provider]   Physical Exam: Vitals:   07/09/21 1336 07/09/21 1337 07/09/21 1646  BP: 119/64  109/68  Pulse: 94  88  Resp: 20  (!) 21  Temp: 98.6 F (37 C)    TempSrc: Oral    SpO2: 94%  94%  Weight:  74.8 kg   Height:  6' (1.829 m)    Constitutional: appears age-appropriate, frail, NAD, calm, comfortable Eyes: PERRL, lids and conjunctivae normal ENMT: Mucous membranes are moist. Posterior pharynx clear of any exudate or lesions. Age-appropriate dentition.  Unable to fully assess Neck: normal, supple, no masses, no thyromegaly Respiratory: clear to auscultation bilaterally, no wheezing, no crackles. Normal respiratory effort. No accessory muscle use.  Cardiovascular: Regular rate and rhythm, no murmurs / rubs / gallops. No extremity edema. 2+ pedal pulses. No carotid bruits.  Abdomen: Obese abdomen, no tenderness, no masses  palpated, no hepatosplenomegaly. Bowel sounds positive.  Suprapubic catheter in place Musculoskeletal: no clubbing / cyanosis. No joint deformity upper and lower extremities. Good ROM, no contractures, no atrophy. Normal muscle tone.  Skin: no rashes, lesions, ulcers. No induration Neurologic: Sensation intact. Strength 5/5 in all 4.  Psychiatric: Normal judgment and insight. Alert and oriented x 3. Normal mood.   EKG: independently reviewed, showing sinus rhythm with rate of 91, first-degree AV block, QTC 428  Chest x-ray on Admission: I personally reviewed and I agree with radiologist reading as below.  DG Chest 2 View  Result Date: 07/09/2021 CLINICAL DATA:  Shortness of breath.  COVID. EXAM: CHEST - 2 VIEW COMPARISON:  One-view chest x-ray 10/15/2017 FINDINGS: Heart size is normal. Mild pulmonary vascular congestion is present. New left upper lobe and bilateral lower lobe airspace opacities are present. Lung volumes are low. Degenerative and postoperative changes are noted in the right shoulder. IMPRESSION: 1. New left upper lobe and bilateral lower lobe airspace disease compatible with pneumonia. This may be related to viral disease. 2. Low lung volumes and mild pulmonary vascular congestion. Electronically Signed   By: San Morelle M.D.   On: 07/09/2021 14:14    Labs on Admission: I have personally reviewed following labs  CBC: Recent Labs  Lab 07/09/21 1340  WBC 8.0  NEUTROABS 6.1  HGB 13.9  HCT 42.6  MCV 96.6  PLT 242   Basic Metabolic Panel: Recent Labs  Lab 07/09/21 1340  NA 132*  K 3.9  CL 98  CO2 26  GLUCOSE 143*  BUN 33*  CREATININE 1.04  CALCIUM 8.5*   GFR: Estimated Creatinine Clearance: 58.9 mL/min (by C-G formula based on SCr of 1.04 mg/dL).  Liver Function Tests: Recent Labs  Lab 07/09/21 1340  AST 13*  ALT 7  ALKPHOS 51  BILITOT 1.2  PROT 7.0  ALBUMIN 3.4*   Urine analysis:    Component Value Date/Time   COLORURINE YELLOW (A)  10/15/2017 0101   APPEARANCEUR Cloudy (A) 06/25/2021 1547   LABSPEC 1.017 10/15/2017 0101   LABSPEC 1.013 03/21/2014  1433   PHURINE 5.0 10/15/2017 0101   GLUCOSEU Negative 06/25/2021 1547   GLUCOSEU Negative 03/21/2014 1433   HGBUR NEGATIVE 10/15/2017 0101   BILIRUBINUR Negative 06/25/2021 1547   BILIRUBINUR Negative 03/21/2014 1433   KETONESUR 5 (A) 10/15/2017 0101   PROTEINUR 2+ (A) 06/25/2021 1547   PROTEINUR 30 (A) 10/15/2017 0101   UROBILINOGEN 0.2 06/02/2018 1601   NITRITE Negative 06/25/2021 1547   NITRITE NEGATIVE 10/15/2017 0101   LEUKOCYTESUR 3+ (A) 06/25/2021 1547   LEUKOCYTESUR Trace 03/21/2014 1433   Dr. Tobie Poet [facility]  If 7PM-7AM, please contact overnight-coverage provider If 7AM-7PM, please contact day coverage provider [email or web address]  07/09/2021, 8:19 PM

## 2021-07-10 DIAGNOSIS — U071 COVID-19: Secondary | ICD-10-CM | POA: Diagnosis present

## 2021-07-10 DIAGNOSIS — G20 Parkinson's disease: Secondary | ICD-10-CM | POA: Diagnosis present

## 2021-07-10 DIAGNOSIS — K760 Fatty (change of) liver, not elsewhere classified: Secondary | ICD-10-CM | POA: Diagnosis present

## 2021-07-10 DIAGNOSIS — I959 Hypotension, unspecified: Secondary | ICD-10-CM | POA: Diagnosis not present

## 2021-07-10 DIAGNOSIS — F0280 Dementia in other diseases classified elsewhere without behavioral disturbance: Secondary | ICD-10-CM | POA: Diagnosis present

## 2021-07-10 DIAGNOSIS — G250 Essential tremor: Secondary | ICD-10-CM

## 2021-07-10 DIAGNOSIS — R410 Disorientation, unspecified: Secondary | ICD-10-CM | POA: Diagnosis not present

## 2021-07-10 DIAGNOSIS — E7800 Pure hypercholesterolemia, unspecified: Secondary | ICD-10-CM | POA: Diagnosis present

## 2021-07-10 DIAGNOSIS — G2581 Restless legs syndrome: Secondary | ICD-10-CM | POA: Diagnosis present

## 2021-07-10 DIAGNOSIS — F321 Major depressive disorder, single episode, moderate: Secondary | ICD-10-CM | POA: Diagnosis present

## 2021-07-10 DIAGNOSIS — E119 Type 2 diabetes mellitus without complications: Secondary | ICD-10-CM | POA: Diagnosis present

## 2021-07-10 DIAGNOSIS — Z743 Need for continuous supervision: Secondary | ICD-10-CM | POA: Diagnosis not present

## 2021-07-10 DIAGNOSIS — I10 Essential (primary) hypertension: Secondary | ICD-10-CM | POA: Diagnosis not present

## 2021-07-10 DIAGNOSIS — F419 Anxiety disorder, unspecified: Secondary | ICD-10-CM | POA: Diagnosis present

## 2021-07-10 DIAGNOSIS — J9601 Acute respiratory failure with hypoxia: Secondary | ICD-10-CM | POA: Diagnosis present

## 2021-07-10 DIAGNOSIS — G4700 Insomnia, unspecified: Secondary | ICD-10-CM | POA: Diagnosis present

## 2021-07-10 DIAGNOSIS — Z7984 Long term (current) use of oral hypoglycemic drugs: Secondary | ICD-10-CM | POA: Diagnosis not present

## 2021-07-10 DIAGNOSIS — Z79899 Other long term (current) drug therapy: Secondary | ICD-10-CM | POA: Diagnosis not present

## 2021-07-10 DIAGNOSIS — Z882 Allergy status to sulfonamides status: Secondary | ICD-10-CM | POA: Diagnosis not present

## 2021-07-10 DIAGNOSIS — M1990 Unspecified osteoarthritis, unspecified site: Secondary | ICD-10-CM | POA: Diagnosis not present

## 2021-07-10 LAB — COMPREHENSIVE METABOLIC PANEL
ALT: 6 U/L (ref 0–44)
AST: 13 U/L — ABNORMAL LOW (ref 15–41)
Albumin: 3.5 g/dL (ref 3.5–5.0)
Alkaline Phosphatase: 52 U/L (ref 38–126)
Anion gap: 13 (ref 5–15)
BUN: 35 mg/dL — ABNORMAL HIGH (ref 8–23)
CO2: 20 mmol/L — ABNORMAL LOW (ref 22–32)
Calcium: 8.3 mg/dL — ABNORMAL LOW (ref 8.9–10.3)
Chloride: 98 mmol/L (ref 98–111)
Creatinine, Ser: 0.9 mg/dL (ref 0.61–1.24)
GFR, Estimated: >60 mL/min (ref >60)
Glucose, Bld: 187 mg/dL — ABNORMAL HIGH (ref 70–99)
Potassium: 4.3 mmol/L (ref 3.5–5.1)
Sodium: 131 mmol/L — ABNORMAL LOW (ref 135–145)
Total Bilirubin: 1.1 mg/dL (ref 0.3–1.2)
Total Protein: 7.3 g/dL (ref 6.5–8.1)

## 2021-07-10 LAB — URINALYSIS, COMPLETE (UACMP) WITH MICROSCOPIC
Bilirubin Urine: NEGATIVE
Glucose, UA: NEGATIVE mg/dL
Hgb urine dipstick: NEGATIVE
Ketones, ur: 5 mg/dL — AB
Nitrite: NEGATIVE
Protein, ur: 100 mg/dL — AB
Specific Gravity, Urine: 1.019 (ref 1.005–1.030)
pH: 8 (ref 5.0–8.0)

## 2021-07-10 LAB — CBC WITH DIFFERENTIAL/PLATELET
Abs Immature Granulocytes: 0.04 10*3/uL (ref 0.00–0.07)
Basophils Absolute: 0 10*3/uL (ref 0.0–0.1)
Basophils Relative: 0 %
Eosinophils Absolute: 0 10*3/uL (ref 0.0–0.5)
Eosinophils Relative: 0 %
HCT: 41.5 % (ref 39.0–52.0)
Hemoglobin: 13.7 g/dL (ref 13.0–17.0)
Immature Granulocytes: 1 %
Lymphocytes Relative: 9 %
Lymphs Abs: 0.6 10*3/uL — ABNORMAL LOW (ref 0.7–4.0)
MCH: 31.2 pg (ref 26.0–34.0)
MCHC: 33 g/dL (ref 30.0–36.0)
MCV: 94.5 fL (ref 80.0–100.0)
Monocytes Absolute: 0.1 10*3/uL (ref 0.1–1.0)
Monocytes Relative: 1 %
Neutro Abs: 6.2 10*3/uL (ref 1.7–7.7)
Neutrophils Relative %: 89 %
Platelets: 160 10*3/uL (ref 150–400)
RBC: 4.39 MIL/uL (ref 4.22–5.81)
RDW: 14.2 % (ref 11.5–15.5)
WBC: 7 10*3/uL (ref 4.0–10.5)
nRBC: 0 % (ref 0.0–0.2)

## 2021-07-10 LAB — GLUCOSE, CAPILLARY
Glucose-Capillary: 267 mg/dL — ABNORMAL HIGH (ref 70–99)
Glucose-Capillary: 264 mg/dL — ABNORMAL HIGH (ref 70–99)

## 2021-07-10 LAB — C-REACTIVE PROTEIN: CRP: 17.6 mg/dL — ABNORMAL HIGH (ref <1.0)

## 2021-07-10 LAB — CBG MONITORING, ED
Glucose-Capillary: 189 mg/dL — ABNORMAL HIGH (ref 70–99)
Glucose-Capillary: 190 mg/dL — ABNORMAL HIGH (ref 70–99)

## 2021-07-10 LAB — RESP PANEL BY RT-PCR (FLU A&B, COVID) ARPGX2
Influenza A by PCR: NEGATIVE
Influenza B by PCR: NEGATIVE
SARS Coronavirus 2 by RT PCR: POSITIVE — AB

## 2021-07-10 MED ORDER — GABAPENTIN 100 MG PO CAPS
200.0000 mg | ORAL_CAPSULE | Freq: Three times a day (TID) | ORAL | Status: DC
  Administered 2021-07-10 – 2021-07-14 (×13): 200 mg via ORAL
  Filled 2021-07-10 (×13): qty 2

## 2021-07-10 MED ORDER — SODIUM CHLORIDE 0.9 % IV BOLUS
1000.0000 mL | Freq: Once | INTRAVENOUS | Status: AC
  Administered 2021-07-10: 16:00:00 1000 mL via INTRAVENOUS

## 2021-07-10 MED ORDER — CARBIDOPA-LEVODOPA ER 61.25-245 MG PO CPCR
1.0000 | ORAL_CAPSULE | Freq: Four times a day (QID) | ORAL | Status: DC
  Administered 2021-07-10 – 2021-07-14 (×15): 1 via ORAL
  Filled 2021-07-10 (×13): qty 1

## 2021-07-10 MED ORDER — CARBIDOPA-LEVODOPA ER 50-200 MG PO TBCR
1.0000 | EXTENDED_RELEASE_TABLET | Freq: Every day | ORAL | Status: DC
  Administered 2021-07-10: 04:00:00 1 via ORAL
  Filled 2021-07-10 (×2): qty 1

## 2021-07-10 MED ORDER — CARBIDOPA-LEVODOPA ER 23.75-95 MG PO CPCR
1.0000 | ORAL_CAPSULE | Freq: Four times a day (QID) | ORAL | Status: DC
  Administered 2021-07-10 – 2021-07-14 (×15): 1 via ORAL
  Filled 2021-07-10 (×13): qty 1

## 2021-07-10 MED ORDER — CARBIDOPA-LEVODOPA 25-250 MG PO TABS
1.0000 | ORAL_TABLET | Freq: Four times a day (QID) | ORAL | Status: DC
  Administered 2021-07-10: 09:00:00 1 via ORAL
  Filled 2021-07-10 (×4): qty 1

## 2021-07-10 MED ORDER — SERTRALINE HCL 50 MG PO TABS
25.0000 mg | ORAL_TABLET | Freq: Every day | ORAL | Status: DC
  Administered 2021-07-10 – 2021-07-14 (×5): 25 mg via ORAL
  Filled 2021-07-10 (×5): qty 1

## 2021-07-10 MED ORDER — METFORMIN HCL 500 MG PO TABS
500.0000 mg | ORAL_TABLET | Freq: Two times a day (BID) | ORAL | Status: DC
  Administered 2021-07-10 – 2021-07-14 (×10): 500 mg via ORAL
  Filled 2021-07-10 (×11): qty 1

## 2021-07-10 MED ORDER — SODIUM CHLORIDE 0.9 % IV BOLUS
500.0000 mL | Freq: Once | INTRAVENOUS | Status: AC
  Administered 2021-07-10: 11:00:00 500 mL via INTRAVENOUS

## 2021-07-10 MED ORDER — SODIUM CHLORIDE 0.9 % IV SOLN: INTRAVENOUS | Status: DC

## 2021-07-10 MED ORDER — ENALAPRIL MALEATE 2.5 MG PO TABS
2.5000 mg | ORAL_TABLET | Freq: Two times a day (BID) | ORAL | Status: DC
  Administered 2021-07-10 – 2021-07-11 (×3): 2.5 mg via ORAL
  Filled 2021-07-10 (×4): qty 1

## 2021-07-10 MED ORDER — QUETIAPINE FUMARATE 25 MG PO TABS
25.0000 mg | ORAL_TABLET | Freq: Every day | ORAL | Status: DC
  Administered 2021-07-10 – 2021-07-13 (×5): 25 mg via ORAL
  Filled 2021-07-10 (×5): qty 1

## 2021-07-10 MED ORDER — ASCORBIC ACID 500 MG PO TABS
1000.0000 mg | ORAL_TABLET | Freq: Every day | ORAL | Status: DC
  Administered 2021-07-10 – 2021-07-14 (×5): 1000 mg via ORAL
  Filled 2021-07-10 (×5): qty 2

## 2021-07-10 MED ORDER — ZINC SULFATE 220 (50 ZN) MG PO CAPS
220.0000 mg | ORAL_CAPSULE | Freq: Every day | ORAL | Status: DC
  Administered 2021-07-10 – 2021-07-14 (×5): 220 mg via ORAL
  Filled 2021-07-10 (×5): qty 1

## 2021-07-10 MED ORDER — AMANTADINE HCL 100 MG PO CAPS
100.0000 mg | ORAL_CAPSULE | Freq: Every day | ORAL | Status: DC
  Administered 2021-07-10 – 2021-07-14 (×5): 100 mg via ORAL
  Filled 2021-07-10 (×5): qty 1

## 2021-07-10 NOTE — Progress Notes (Signed)
PROGRESS NOTE    Roger Rodriguez  MWU:[id number] DOB: Feb 27, 1941 DOA: 07/09/2021 PCP: [name]., MD    Brief Narrative:  80 y.o. male with medical history significant for chronic suprapubic catheter, status post changed on 06/25/2021 outpatient, hypertension, history of depression, non-insulin-dependent diabetes mellitus, idiopathic Parkinson's disease, hepatic steatosis, history of infection of prosthetic joints, restless leg syndrome, pure hypercholesterolemia, who presents emergency department from home for chief concerns of shortness of breath. Pt was found to be covid pos  Assessment & Plan:   Principal Problem:   Acute hypoxemic respiratory failure due to COVID-19 [facility]) Active Problems:   Arthritis   Essential (primary) hypertension   Hyperlipidemia   Lumbar canal stenosis   Benign essential tremor   Parkinson's disease (HCC)   Depression, major, single episode, moderate ([name])   COVID-19  # Acute hypoxemic respiratory failure secondary to COVID-19 infection - IV remdesivir per EDP order - Incentive spirometry and flutter valve for 10 reps every 2 hours while awake - Albuterol inhaler 2 puffs every 4 hours while awake - Daily labs: CMP, CBC, CRP - Supplemental oxygen to maintain SPO2 goal of greater than 92% - Airborne and contact precautions - O2 sats currently 98% on supplemental O2 -Have ordered vitamin C and zinc   # Parkinson's disease- - Patient was prescribed carbidopa/levodopa 25/250 mg 4 times daily, carbidopa/levodopa (Sinemet CR) 50/200 mg nightly resumed based on neurology outpatient note on 11/26/20 -Amantadine 100 mg daily -Seems stable at this time   # Parkinson's disease dementia-as above   # Anxiety/depression-sertraline daily # Restless leg syndrome-gabapentin 200 mg 3 times daily   # Non-insulin-dependent diabetes mellitus-metformin 500 mg p.o. twice daily resume - insulin SSI with at bedtime coverage ordered - Goal inpatient blood  glucose is 140-180 -recent a1c of 7.1   # History of hypertension-enalapril 2.5 mg p.o. twice daily resumed # Insomnia-quetiapine 25 mg nightly resumed  #hypotension -sbp noted to be in the 70-80's -Improved with NS bolus -[place] cont on basal IVF   DVT prophylaxis: Lovenox subq Code Status: Full Family Communication: Pt in room, family not at bedside  Status is: Inpatient  Remains inpatient appropriate because: Severity of illness    Consultants:    Procedures:    Antimicrobials: Anti-infectives (From admission, onward)    Start     Dose/Rate Route Frequency Ordered Stop   07/10/21 1000  remdesivir 100 mg in sodium chloride 0.9 % 100 mL IVPB       See Hyperspace for full Linked Orders Report.   100 mg 200 mL/hr over 30 Minutes Intravenous Daily 07/09/21 1942 07/14/21 0959   07/09/21 1945  remdesivir 200 mg in sodium chloride 0.9% 250 mL IVPB       See Hyperspace for full Linked Orders Report.   200 mg 580 mL/hr over 30 Minutes Intravenous Once 07/09/21 1942 07/10/21 0030       Subjective: Wanting a wet paper towel  Objective: Vitals:   07/10/21 1530 07/10/21 1600 07/10/21 1745 07/10/21 1810  BP: (!) 93/48 (!) 94/56 (!) 106/54 109/65  Pulse:   76 78  Resp: 14 17 (!) 21   Temp:    (!) 97.5 F (36.4 C)  TempSrc:    Oral  SpO2:   95% 98%  Weight:      Height:        Intake/Output Summary (Last 24 hours) at 07/10/2021 1919 Last data filed at 07/10/2021 1806 Gross per 24 hour  Intake 100 ml  Output  900 ml  Net -800 ml   Filed Weights   07/09/21 1337  Weight: 74.8 kg    Examination: General exam: Awake, laying in bed, in nad Respiratory system: Normal respiratory effort, no wheezing Cardiovascular system: regular rate, s1, s2 Gastrointestinal system: Soft, nondistended, positive BS Central nervous system: CN2-12 grossly intact, strength intact Extremities: Perfused, no clubbing Skin: Normal skin turgor, no notable skin lesions seen Psychiatry:  Mood normal // no visual hallucinations   Data Reviewed: I have personally reviewed following labs and imaging studies  CBC: Recent Labs  Lab 07/09/21 1340 07/10/21 0814  WBC 8.0 7.0  NEUTROABS 6.1 6.2  HGB 13.9 13.7  HCT 42.6 41.5  MCV 96.6 94.5  PLT 156 518   Basic Metabolic Panel: Recent Labs  Lab 07/09/21 1340 07/10/21 0814  NA 132* 131*  K 3.9 4.3  CL 98 98  CO2 26 20*  GLUCOSE 143* 187*  BUN 33* 35*  CREATININE 1.04 0.90  CALCIUM 8.5* 8.3*   GFR: Estimated Creatinine Clearance: 68.1 mL/min (by C-G formula based on SCr of 0.9 mg/dL). Liver Function Tests: Recent Labs  Lab 07/09/21 1340 07/10/21 0814  AST 13* 13*  ALT 7 6  ALKPHOS 51 52  BILITOT 1.2 1.1  PROT 7.0 7.3  ALBUMIN 3.4* 3.5   No results for input(s): LIPASE, AMYLASE in the last 168 hours. No results for input(s): AMMONIA in the last 168 hours. Coagulation Profile: No results for input(s): INR, PROTIME in the last 168 hours. Cardiac Enzymes: No results for input(s): CKTOTAL, CKMB, CKMBINDEX, TROPONINI in the last 168 hours. BNP (last 3 results) No results for input(s): PROBNP in the last 8760 hours. HbA1C: No results for input(s): HGBA1C in the last 72 hours. CBG: Recent Labs  Lab 07/10/21 0033 07/10/21 0901 07/10/21 1804  GLUCAP 189* 190* 267*   Lipid Profile: No results for input(s): CHOL, HDL, LDLCALC, TRIG, CHOLHDL, LDLDIRECT in the last 72 hours. Thyroid Function Tests: No results for input(s): TSH, T4TOTAL, FREET4, T3FREE, THYROIDAB in the last 72 hours. Anemia Panel: No results for input(s): VITAMINB12, FOLATE, FERRITIN, TIBC, IRON, RETICCTPCT in the last 72 hours. Sepsis Labs: Recent Labs  Lab 07/09/21 1340 07/09/21 1430 07/09/21 1640  PROCALCITON  --  0.16  --   LATICACIDVEN 1.0  --  0.9    Recent Results (from the past 240 hour(s))  Resp Panel by RT-PCR (Flu A&B, Covid) Nasopharyngeal Swab     Status: Abnormal   Collection Time: 07/10/21  3:44 AM   Specimen:  Nasopharyngeal Swab; Nasopharyngeal(NP) swabs in vial transport medium  Result Value Ref Range Status   SARS Coronavirus 2 by RT PCR POSITIVE (A) NEGATIVE Final    Comment: (NOTE) SARS-CoV-2 target nucleic acids are DETECTED.  The SARS-CoV-2 RNA is generally detectable in upper respiratory specimens during the acute phase of infection. Positive results are indicative of the presence of the identified virus, but do not rule out bacterial infection or co-infection with other pathogens not detected by the test. Clinical correlation with patient history and other diagnostic information is necessary to determine patient infection status. The expected result is Negative.  Fact Sheet for Patients: EntrepreneurPulse.com.au  Fact Sheet for Healthcare Providers: IncredibleEmployment.be  This test is not yet approved or cleared by the [place] FDA and  has been authorized for detection and/or diagnosis of SARS-CoV-2 by FDA under an Emergency Use Authorization (EUA).  This EUA will remain in effect (meaning this test can be used) for the duration of  the COVID-19 declaration under Section 564(b)(1) of the A ct, 21 U.S.C. section 360bbb-3(b)(1), unless the authorization is terminated or revoked sooner.     Influenza A by PCR NEGATIVE NEGATIVE Final   Influenza B by PCR NEGATIVE NEGATIVE Final    Comment: (NOTE) The Xpert Xpress SARS-CoV-2/FLU/RSV plus assay is intended as an aid in the diagnosis of influenza from Nasopharyngeal swab specimens and should not be used as a sole basis for treatment. Nasal washings and aspirates are unacceptable for Xpert Xpress SARS-CoV-2/FLU/RSV testing.  Fact Sheet for Patients: EntrepreneurPulse.com.au  Fact Sheet for Healthcare Providers: IncredibleEmployment.be  This test is not yet approved or cleared by the [place] FDA and has been authorized for detection and/or  diagnosis of SARS-CoV-2 by FDA under an Emergency Use Authorization (EUA). This EUA will remain in effect (meaning this test can be used) for the duration of the COVID-19 declaration under Section 564(b)(1) of the Act, 21 U.S.C. section 360bbb-3(b)(1), unless the authorization is terminated or revoked.  Performed at [facility], [address], [place], [place] [address]      Radiology Studies: DG Chest 2 View  Result Date: 07/09/2021 CLINICAL DATA:  Shortness of breath.  COVID. EXAM: CHEST - 2 VIEW COMPARISON:  One-view chest x-ray 10/15/2017 FINDINGS: Heart size is normal. Mild pulmonary vascular congestion is present. New left upper lobe and bilateral lower lobe airspace opacities are present. Lung volumes are low. Degenerative and postoperative changes are noted in the right shoulder. IMPRESSION: 1. New left upper lobe and bilateral lower lobe airspace disease compatible with pneumonia. This may be related to viral disease. 2. Low lung volumes and mild pulmonary vascular congestion. Electronically Signed   By: San Morelle M.D.   On: 07/09/2021 14:14    Scheduled Meds:  amantadine  100 mg Oral Daily   Carbidopa-Levodopa ER  1 capsule Oral QID   And   Carbidopa-Levodopa ER  1 capsule Oral QID   enalapril  2.5 mg Oral BID   enoxaparin (LOVENOX) injection  40 mg Subcutaneous Q24H   gabapentin  200 mg Oral TID   insulin aspart  0-20 Units Subcutaneous TID WC   insulin aspart  0-5 Units Subcutaneous QHS   metFORMIN  500 mg Oral BID WC   methylPREDNISolone (SOLU-MEDROL) injection  0.5 mg/kg Intravenous Q12H   Followed by   Derrill Memo ON 07/13/2021] predniSONE  50 mg Oral Daily   QUEtiapine  25 mg Oral QHS   sertraline  25 mg Oral Daily   Continuous Infusions:  sodium chloride 100 mL/hr at 07/10/21 1800   remdesivir 100 mg in NS 100 mL Stopped (07/10/21 1058)     LOS: 0 days   Marylu Lund, MD [facility] Pager On Amion  If 7PM-7AM, please contact  night-coverage 07/10/2021, 7:19 PM

## 2021-07-10 NOTE — ED Notes (Signed)
Pt resting with eyes closed, respirations even and unlabored.

## 2021-07-10 NOTE — ED Notes (Signed)
Informed RN bed assigned 

## 2021-07-10 NOTE — ED Notes (Signed)
Secure msg sent to Arlyn Leak, RN for ED to [employer].

## 2021-07-10 NOTE — ED Notes (Signed)
Secure msg sent to Dr. Tobie Poet abt pts bp still being low after bolus.

## 2021-07-10 NOTE — ED Notes (Signed)
This RN called [phone] in attempt to speak with receiving RN Arlyn Leak), no answer. Msg sent to ER charge RN Opal Sidles.

## 2021-07-10 NOTE — ED Notes (Signed)
Sent Dr Cox secure chat concerning pt's BP

## 2021-07-10 NOTE — ED Notes (Signed)
Lab at bedside for morning blood

## 2021-07-11 LAB — CBC WITH DIFFERENTIAL/PLATELET
Abs Immature Granulocytes: 0.03 10*3/uL (ref 0.00–0.07)
Basophils Absolute: 0 10*3/uL (ref 0.0–0.1)
Basophils Relative: 0 %
Eosinophils Absolute: 0 10*3/uL (ref 0.0–0.5)
Eosinophils Relative: 0 %
HCT: 39.1 % (ref 39.0–52.0)
Hemoglobin: 12.8 g/dL — ABNORMAL LOW (ref 13.0–17.0)
Immature Granulocytes: 0 %
Lymphocytes Relative: 9 %
Lymphs Abs: 0.6 10*3/uL — ABNORMAL LOW (ref 0.7–4.0)
MCH: 31 pg (ref 26.0–34.0)
MCHC: 32.7 g/dL (ref 30.0–36.0)
MCV: 94.7 fL (ref 80.0–100.0)
Monocytes Absolute: 0.4 10*3/uL (ref 0.1–1.0)
Monocytes Relative: 5 %
Neutro Abs: 6.4 10*3/uL (ref 1.7–7.7)
Neutrophils Relative %: 86 %
Platelets: 171 10*3/uL (ref 150–400)
RBC: 4.13 MIL/uL — ABNORMAL LOW (ref 4.22–5.81)
RDW: 14.3 % (ref 11.5–15.5)
WBC: 7.4 10*3/uL (ref 4.0–10.5)
nRBC: 0 % (ref 0.0–0.2)

## 2021-07-11 LAB — COMPREHENSIVE METABOLIC PANEL
ALT: 11 U/L (ref 0–44)
AST: 16 U/L (ref 15–41)
Albumin: 3.2 g/dL — ABNORMAL LOW (ref 3.5–5.0)
Alkaline Phosphatase: 48 U/L (ref 38–126)
Anion gap: 9 (ref 5–15)
BUN: 34 mg/dL — ABNORMAL HIGH (ref 8–23)
CO2: 22 mmol/L (ref 22–32)
Calcium: 8.2 mg/dL — ABNORMAL LOW (ref 8.9–10.3)
Chloride: 106 mmol/L (ref 98–111)
Creatinine, Ser: 0.9 mg/dL (ref 0.61–1.24)
GFR, Estimated: >60 mL/min (ref >60)
Glucose, Bld: 224 mg/dL — ABNORMAL HIGH (ref 70–99)
Potassium: 4.1 mmol/L (ref 3.5–5.1)
Sodium: 137 mmol/L (ref 135–145)
Total Bilirubin: 0.8 mg/dL (ref 0.3–1.2)
Total Protein: 6.8 g/dL (ref 6.5–8.1)

## 2021-07-11 LAB — GLUCOSE, CAPILLARY
Glucose-Capillary: 208 mg/dL — ABNORMAL HIGH (ref 70–99)
Glucose-Capillary: 228 mg/dL — ABNORMAL HIGH (ref 70–99)
Glucose-Capillary: 211 mg/dL — ABNORMAL HIGH (ref 70–99)
Glucose-Capillary: 180 mg/dL — ABNORMAL HIGH (ref 70–99)

## 2021-07-11 LAB — C-REACTIVE PROTEIN: CRP: 10.1 mg/dL — ABNORMAL HIGH (ref <1.0)

## 2021-07-11 MED ORDER — INSULIN GLARGINE-YFGN 100 UNIT/ML ~~LOC~~ SOLN
10.0000 [IU] | Freq: Every day | SUBCUTANEOUS | Status: DC
  Administered 2021-07-12 – 2021-07-14 (×3): 10 [IU] via SUBCUTANEOUS
  Filled 2021-07-11 (×4): qty 0.1

## 2021-07-11 MED ORDER — GUAIFENESIN ER 600 MG PO TB12
600.0000 mg | ORAL_TABLET | Freq: Two times a day (BID) | ORAL | Status: DC
  Filled 2021-07-11: qty 1

## 2021-07-11 MED ORDER — INSULIN ASPART 100 UNIT/ML IJ SOLN
3.0000 [IU] | Freq: Three times a day (TID) | INTRAMUSCULAR | Status: DC
  Administered 2021-07-11 – 2021-07-14 (×9): 3 [IU] via SUBCUTANEOUS
  Filled 2021-07-11 (×9): qty 1

## 2021-07-11 MED ORDER — INSULIN GLARGINE-YFGN 100 UNIT/ML ~~LOC~~ SOLN
5.0000 [IU] | Freq: Every day | SUBCUTANEOUS | Status: DC
  Administered 2021-07-11: 15:00:00 5 [IU] via SUBCUTANEOUS
  Filled 2021-07-11 (×2): qty 0.05

## 2021-07-11 NOTE — Progress Notes (Signed)
PROGRESS NOTE    Roger Rodriguez  RDE:[id number] DOB: January 21, 1941 DOA: 07/09/2021 PCP: [name]., MD    Brief Narrative:  80 y.o. male with medical history significant for chronic suprapubic catheter, status post changed on 06/25/2021 outpatient, hypertension, history of depression, non-insulin-dependent diabetes mellitus, idiopathic Parkinson's disease, hepatic steatosis, history of infection of prosthetic joints, restless leg syndrome, pure hypercholesterolemia, who presents emergency department from home for chief concerns of shortness of breath. Pt was found to be covid pos  Assessment & Plan:   Principal Problem:   Acute hypoxemic respiratory failure due to COVID-19 [facility]) Active Problems:   Arthritis   Essential (primary) hypertension   Hyperlipidemia   Lumbar canal stenosis   Benign essential tremor   Parkinson's disease (HCC)   Depression, major, single episode, moderate ([place])   COVID-19  # Acute hypoxemic respiratory failure secondary to COVID-19 infection - continued with IV remdesivir  - Incentive spirometry and flutter valve for 10 reps every 2 hours while awake - Albuterol inhaler 2 puffs every 4 hours while awake - Follow daily labs: CMP, CBC, CRP - Supplemental oxygen to maintain SPO2 goal of greater than 92% - Airborne and contact precautions -Have ordered vitamin C and zinc -Cont to wean O2 as tolerated   # Parkinson's disease- - Patient was prescribed carbidopa/levodopa 25/250 mg 4 times daily, carbidopa/levodopa (Sinemet CR) 50/200 mg nightly resumed based on neurology outpatient note on 11/26/20 -Amantadine 100 mg daily -Appears to be stable at this time   # Parkinson's disease dementia-as above   # Anxiety/depression-sertraline daily # Restless leg syndrome-gabapentin 200 mg 3 times daily   # Non-insulin-dependent diabetes mellitus-metformin 500 mg p.o. twice daily - insulin SSI with at bedtime coverage ordered -recent a1c of 7.1 -Glucose  trends into the 200's. Appreciate diabetic coordinator recs -increase long acting to 10 units and add 3 units meal coverage   # History of hypertension -Pt recently hypotensive requiring NS bolus -hold BP meds for now  # Insomnia-quetiapine 25 mg nightly resumed  #hypotension -resolved with IVF -bp med on hold for now   DVT prophylaxis: Lovenox subq Code Status: Full Family Communication: Pt in room, family not at bedside  Status is: Inpatient  Remains inpatient appropriate because: Severity of illness    Consultants:    Procedures:    Antimicrobials: Anti-infectives (From admission, onward)    Start     Dose/Rate Route Frequency Ordered Stop   07/10/21 1000  remdesivir 100 mg in sodium chloride 0.9 % 100 mL IVPB       See Hyperspace for full Linked Orders Report.   100 mg 200 mL/hr over 30 Minutes Intravenous Daily 07/09/21 1942 07/14/21 0959   07/09/21 1945  remdesivir 200 mg in sodium chloride 0.9% 250 mL IVPB       See Hyperspace for full Linked Orders Report.   200 mg 580 mL/hr over 30 Minutes Intravenous Once 07/09/21 1942 07/10/21 0030       Subjective: Reporting phlegm in throat  Objective: Vitals:   07/10/21 1810 07/10/21 1955 07/10/21 2057 07/11/21 0828  BP: 109/65 (!) 104/58 104/63 126/79  Pulse: 78 80 79 (!) 101  Resp:   20 16  Temp: (!) 97.5 F (36.4 C) 97.9 F (36.6 C) 97.9 F (36.6 C) (!) 97.5 F (36.4 C)  TempSrc: Oral  Oral   SpO2: 98% 96% 96% 97%  Weight:      Height:        Intake/Output Summary (Last 24  hours) at 07/11/2021 1610 Last data filed at 07/11/2021 0615 Gross per 24 hour  Intake 0 ml  Output 3300 ml  Net -3300 ml    Filed Weights   07/09/21 1337  Weight: 74.8 kg    Examination: General exam: Conversant, in no acute distress Respiratory system: normal chest rise, clear, no audible wheezing Cardiovascular system: regular rhythm, s1-s2 Gastrointestinal system: Nondistended, nontender, pos BS Central nervous  system: No seizures, no tremors Extremities: No cyanosis, no joint deformities Skin: No rashes, no pallor Psychiatry: Affect normal // no auditory hallucinations   Data Reviewed: I have personally reviewed following labs and imaging studies  CBC: Recent Labs  Lab 07/09/21 1340 07/10/21 0814 07/11/21 0520  WBC 8.0 7.0 7.4  NEUTROABS 6.1 6.2 6.4  HGB 13.9 13.7 12.8*  HCT 42.6 41.5 39.1  MCV 96.6 94.5 94.7  PLT 156 160 829    Basic Metabolic Panel: Recent Labs  Lab 07/09/21 1340 07/10/21 0814 07/11/21 0520  NA 132* 131* 137  K 3.9 4.3 4.1  CL 98 98 106  CO2 26 20* 22  GLUCOSE 143* 187* 224*  BUN 33* 35* 34*  CREATININE 1.04 0.90 0.90  CALCIUM 8.5* 8.3* 8.2*    GFR: Estimated Creatinine Clearance: 68.1 mL/min (by C-G formula based on SCr of 0.9 mg/dL). Liver Function Tests: Recent Labs  Lab 07/09/21 1340 07/10/21 0814 07/11/21 0520  AST 13* 13* 16  ALT 7 6 11   ALKPHOS 51 52 48  BILITOT 1.2 1.1 0.8  PROT 7.0 7.3 6.8  ALBUMIN 3.4* 3.5 3.2*    No results for input(s): LIPASE, AMYLASE in the last 168 hours. No results for input(s): AMMONIA in the last 168 hours. Coagulation Profile: No results for input(s): INR, PROTIME in the last 168 hours. Cardiac Enzymes: No results for input(s): CKTOTAL, CKMB, CKMBINDEX, TROPONINI in the last 168 hours. BNP (last 3 results) No results for input(s): PROBNP in the last 8760 hours. HbA1C: No results for input(s): HGBA1C in the last 72 hours. CBG: Recent Labs  Lab 07/10/21 0901 07/10/21 1804 07/10/21 2052 07/11/21 0831 07/11/21 1140  GLUCAP 190* 267* 264* 208* 228*    Lipid Profile: No results for input(s): CHOL, HDL, LDLCALC, TRIG, CHOLHDL, LDLDIRECT in the last 72 hours. Thyroid Function Tests: No results for input(s): TSH, T4TOTAL, FREET4, T3FREE, THYROIDAB in the last 72 hours. Anemia Panel: No results for input(s): VITAMINB12, FOLATE, FERRITIN, TIBC, IRON, RETICCTPCT in the last 72 hours. Sepsis  Labs: Recent Labs  Lab 07/09/21 1340 07/09/21 1430 07/09/21 1640  PROCALCITON  --  0.16  --   LATICACIDVEN 1.0  --  0.9     Recent Results (from the past 240 hour(s))  Resp Panel by RT-PCR (Flu A&B, Covid) Nasopharyngeal Swab     Status: Abnormal   Collection Time: 07/10/21  3:44 AM   Specimen: Nasopharyngeal Swab; Nasopharyngeal(NP) swabs in vial transport medium  Result Value Ref Range Status   SARS Coronavirus 2 by RT PCR POSITIVE (A) NEGATIVE Final    Comment: (NOTE) SARS-CoV-2 target nucleic acids are DETECTED.  The SARS-CoV-2 RNA is generally detectable in upper respiratory specimens during the acute phase of infection. Positive results are indicative of the presence of the identified virus, but do not rule out bacterial infection or co-infection with other pathogens not detected by the test. Clinical correlation with patient history and other diagnostic information is necessary to determine patient infection status. The expected result is Negative.  Fact Sheet for Patients: EntrepreneurPulse.com.au  Fact Sheet  for Healthcare Providers: IncredibleEmployment.be  This test is not yet approved or cleared by the [place] and  has been authorized for detection and/or diagnosis of SARS-CoV-2 by FDA under an Emergency Use Authorization (EUA).  This EUA will remain in effect (meaning this test can be used) for the duration of  the COVID-19 declaration under Section 564(b)(1) of the A ct, 21 U.S.C. section 360bbb-3(b)(1), unless the authorization is terminated or revoked sooner.     Influenza A by PCR NEGATIVE NEGATIVE Final   Influenza B by PCR NEGATIVE NEGATIVE Final    Comment: (NOTE) The Xpert Xpress SARS-CoV-2/FLU/RSV plus assay is intended as an aid in the diagnosis of influenza from Nasopharyngeal swab specimens and should not be used as a sole basis for treatment. Nasal washings and aspirates are unacceptable for Xpert  Xpress SARS-CoV-2/FLU/RSV testing.  Fact Sheet for Patients: EntrepreneurPulse.com.au  Fact Sheet for Healthcare Providers: IncredibleEmployment.be  This test is not yet approved or cleared by the [place] FDA and has been authorized for detection and/or diagnosis of SARS-CoV-2 by FDA under an Emergency Use Authorization (EUA). This EUA will remain in effect (meaning this test can be used) for the duration of the COVID-19 declaration under Section 564(b)(1) of the Act, 21 U.S.C. section 360bbb-3(b)(1), unless the authorization is terminated or revoked.  Performed at [facility], [address], [place], [place] [address]       Radiology Studies: No results found.  Scheduled Meds:  amantadine  100 mg Oral Daily   vitamin C  1,000 mg Oral Daily   Carbidopa-Levodopa ER  1 capsule Oral QID   And   Carbidopa-Levodopa ER  1 capsule Oral QID   enoxaparin (LOVENOX) injection  40 mg Subcutaneous Q24H   gabapentin  200 mg Oral TID   guaiFENesin  600 mg Oral BID   insulin aspart  0-20 Units Subcutaneous TID WC   insulin aspart  0-5 Units Subcutaneous QHS   insulin aspart  3 Units Subcutaneous TID WC   [START ON 07/12/2021] insulin glargine-yfgn  10 Units Subcutaneous Daily   metFORMIN  500 mg Oral BID WC   methylPREDNISolone (SOLU-MEDROL) injection  0.5 mg/kg Intravenous Q12H   Followed by   Derrill Memo ON 07/13/2021] predniSONE  50 mg Oral Daily   QUEtiapine  25 mg Oral QHS   sertraline  25 mg Oral Daily   zinc sulfate  220 mg Oral Daily   Continuous Infusions:  sodium chloride 100 mL/hr at 07/10/21 1800   remdesivir 100 mg in NS 100 mL 100 mg (07/11/21 1147)     LOS: 1 day   Marylu Lund, MD [facility] Pager On Amion  If 7PM-7AM, please contact night-coverage 07/11/2021, 4:10 PM

## 2021-07-11 NOTE — Progress Notes (Signed)
Inpatient Diabetes Program Recommendations  AACE/ADA: New Consensus Statement on Inpatient Glycemic Control  Target Ranges:  Prepandial:   less than 140 mg/dL      Peak postprandial:   less than 180 mg/dL (1-2 hours)      Critically ill patients:  140 - 180 mg/dL    Latest Reference Range & Units 07/10/21 00:33 07/10/21 09:01 07/10/21 18:04 07/10/21 20:52 07/11/21 08:31  Glucose-Capillary 70 - 99 mg/dL 189 (H) 190 (H) 267 (H) 264 (H) 208 (H)    Review of Glycemic Control  Diabetes history: DM2 Outpatient Diabetes medications: Metformin 500 mg BID Current orders for Inpatient glycemic control: Novolog 0-20 units TID with meals, Novolog 0-5 units QHS, Metformin 500 mg BID; Solumedrol 37.6 mg Q12H  Inpatient Diabetes Program Recommendations:    Insulin: If steroids are continued as ordered, please consider ordering Semglee 5 units Q24H and Novolog 3 units TID with meals for meal coverage if patient eats at least 50% of meals.  Thanks, Barnie Alderman, RN, MSN, CDE Diabetes Coordinator Inpatient Diabetes Program [phone] (Team Pager from 8am to 5pm)

## 2021-07-12 LAB — CBC WITH DIFFERENTIAL/PLATELET
Abs Immature Granulocytes: 0.04 10*3/uL (ref 0.00–0.07)
Basophils Absolute: 0 10*3/uL (ref 0.0–0.1)
Basophils Relative: 0 %
Eosinophils Absolute: 0 10*3/uL (ref 0.0–0.5)
Eosinophils Relative: 0 %
HCT: 38.1 % — ABNORMAL LOW (ref 39.0–52.0)
Hemoglobin: 12.7 g/dL — ABNORMAL LOW (ref 13.0–17.0)
Immature Granulocytes: 1 %
Lymphocytes Relative: 8 %
Lymphs Abs: 0.6 10*3/uL — ABNORMAL LOW (ref 0.7–4.0)
MCH: 32.2 pg (ref 26.0–34.0)
MCHC: 33.3 g/dL (ref 30.0–36.0)
MCV: 96.7 fL (ref 80.0–100.0)
Monocytes Absolute: 0.2 10*3/uL (ref 0.1–1.0)
Monocytes Relative: 3 %
Neutro Abs: 7.2 10*3/uL (ref 1.7–7.7)
Neutrophils Relative %: 88 %
Platelets: 174 10*3/uL (ref 150–400)
RBC: 3.94 MIL/uL — ABNORMAL LOW (ref 4.22–5.81)
RDW: 14 % (ref 11.5–15.5)
WBC: 8.1 10*3/uL (ref 4.0–10.5)
nRBC: 0 % (ref 0.0–0.2)

## 2021-07-12 LAB — COMPREHENSIVE METABOLIC PANEL
ALT: 6 U/L (ref 0–44)
AST: 14 U/L — ABNORMAL LOW (ref 15–41)
Albumin: 3 g/dL — ABNORMAL LOW (ref 3.5–5.0)
Alkaline Phosphatase: 43 U/L (ref 38–126)
Anion gap: 6 (ref 5–15)
BUN: 34 mg/dL — ABNORMAL HIGH (ref 8–23)
CO2: 24 mmol/L (ref 22–32)
Calcium: 8.2 mg/dL — ABNORMAL LOW (ref 8.9–10.3)
Chloride: 106 mmol/L (ref 98–111)
Creatinine, Ser: 0.74 mg/dL (ref 0.61–1.24)
GFR, Estimated: >60 mL/min (ref >60)
Glucose, Bld: 208 mg/dL — ABNORMAL HIGH (ref 70–99)
Potassium: 4.2 mmol/L (ref 3.5–5.1)
Sodium: 136 mmol/L (ref 135–145)
Total Bilirubin: 0.8 mg/dL (ref 0.3–1.2)
Total Protein: 6.4 g/dL — ABNORMAL LOW (ref 6.5–8.1)

## 2021-07-12 LAB — GLUCOSE, CAPILLARY
Glucose-Capillary: 171 mg/dL — ABNORMAL HIGH (ref 70–99)
Glucose-Capillary: 316 mg/dL — ABNORMAL HIGH (ref 70–99)
Glucose-Capillary: 242 mg/dL — ABNORMAL HIGH (ref 70–99)
Glucose-Capillary: 357 mg/dL — ABNORMAL HIGH (ref 70–99)

## 2021-07-12 LAB — C-REACTIVE PROTEIN: CRP: 4.8 mg/dL — ABNORMAL HIGH (ref <1.0)

## 2021-07-12 NOTE — Progress Notes (Signed)
PROGRESS NOTE    Roger Rodriguez  VQM:[id number] DOB: 1940-07-21 DOA: 07/09/2021 PCP: [name]., MD    Brief Narrative:  80 y.o. male with medical history significant for chronic suprapubic catheter, status post changed on 06/25/2021 outpatient, hypertension, history of depression, non-insulin-dependent diabetes mellitus, idiopathic Parkinson's disease, hepatic steatosis, history of infection of prosthetic joints, restless leg syndrome, pure hypercholesterolemia, who presents emergency department from home for chief concerns of shortness of breath. Pt was found to be covid pos  Assessment & Plan:   Principal Problem:   Acute hypoxemic respiratory failure due to COVID-19 [facility]) Active Problems:   Arthritis   Essential (primary) hypertension   Hyperlipidemia   Lumbar canal stenosis   Benign essential tremor   Parkinson's disease (HCC)   Depression, major, single episode, moderate ([place])   COVID-19  # Acute hypoxemic respiratory failure secondary to COVID-19 infection - continued with IV remdesivir  - Incentive spirometry and flutter valve for 10 reps every 2 hours while awake - Albuterol inhaler 2 puffs every 4 hours while awake - Follow daily labs: CMP, CBC, CRP - Supplemental oxygen to maintain SPO2 goal of greater than 92% - Airborne and contact precautions -Have ordered vitamin C and zinc -Cont to wean O2 as tolerated, goal to room air -Procal is improving, down to 4.8   # Parkinson's disease- - Patient was prescribed carbidopa/levodopa 25/250 mg 4 times daily, carbidopa/levodopa (Sinemet CR) 50/200 mg nightly resumed based on neurology outpatient note on 11/26/20 -Amantadine 100 mg daily -Remains stable at this time   # Parkinson's disease dementia-as above   # Anxiety/depression-sertraline daily # Restless leg syndrome-gabapentin 200 mg 3 times daily   # Non-insulin-dependent diabetes mellitus-metformin 500 mg p.o. twice daily - insulin SSI with at bedtime  coverage ordered -recent a1c of 7.1 -Glucose trends into the 200's. Appreciate diabetic coordinator recs -On long acting to 10 units and add 3 units meal coverage   # History of hypertension -Pt recently hypotensive requiring NS bolus -BP meds currently on hold for now  # Insomnia-quetiapine 25 mg nightly resumed  #hypotension -resolved with IVF -bp med on hold for now   DVT prophylaxis: Lovenox subq Code Status: Full Family Communication: Pt in room, family not at bedside  Status is: Inpatient  Remains inpatient appropriate because: Severity of illness    Consultants:    Procedures:    Antimicrobials: Anti-infectives (From admission, onward)    Start     Dose/Rate Route Frequency Ordered Stop   07/10/21 1000  remdesivir 100 mg in sodium chloride 0.9 % 100 mL IVPB       See Hyperspace for full Linked Orders Report.   100 mg 200 mL/hr over 30 Minutes Intravenous Daily 07/09/21 1942 07/14/21 0959   07/09/21 1945  remdesivir 200 mg in sodium chloride 0.9% 250 mL IVPB       See Hyperspace for full Linked Orders Report.   200 mg 580 mL/hr over 30 Minutes Intravenous Once 07/09/21 1942 07/10/21 0030       Subjective: Without complaints this afternoon  Objective: Vitals:   07/11/21 0828 07/11/21 1724 07/11/21 2029 07/12/21 0811  BP: 126/79 136/70 123/64 (!) 152/74  Pulse: (!) 101 91 87 85  Resp: 16 17 20 18   Temp: (!) 97.5 F (36.4 C) 98.3 F (36.8 C) 98.5 F (36.9 C) 97.7 F (36.5 C)  TempSrc:  Oral Oral Oral  SpO2: 97% 97% 97% 97%  Weight:      Height:  Intake/Output Summary (Last 24 hours) at 07/12/2021 1619 Last data filed at 07/12/2021 1505 Gross per 24 hour  Intake 4185.29 ml  Output 1700 ml  Net 2485.29 ml    Filed Weights   07/09/21 1337  Weight: 74.8 kg    Examination: General exam: Conversant, in no acute distress Respiratory system: normal chest rise, clear, no audible wheezing Cardiovascular system: regular rhythm,  s1-s2 Gastrointestinal system: Nondistended, nontender, pos BS Central nervous system: No seizures, no tremors Extremities: No cyanosis, no joint deformities Skin: No rashes, no pallor Psychiatry: Affect normal // no auditory hallucinations   Data Reviewed: I have personally reviewed following labs and imaging studies  CBC: Recent Labs  Lab 07/09/21 1340 07/10/21 0814 07/11/21 0520 07/12/21 0524  WBC 8.0 7.0 7.4 8.1  NEUTROABS 6.1 6.2 6.4 7.2  HGB 13.9 13.7 12.8* 12.7*  HCT 42.6 41.5 39.1 38.1*  MCV 96.6 94.5 94.7 96.7  PLT 156 160 171 494    Basic Metabolic Panel: Recent Labs  Lab 07/09/21 1340 07/10/21 0814 07/11/21 0520 07/12/21 0524  NA 132* 131* 137 136  K 3.9 4.3 4.1 4.2  CL 98 98 106 106  CO2 26 20* 22 24  GLUCOSE 143* 187* 224* 208*  BUN 33* 35* 34* 34*  CREATININE 1.04 0.90 0.90 0.74  CALCIUM 8.5* 8.3* 8.2* 8.2*    GFR: Estimated Creatinine Clearance: 76.6 mL/min (by C-G formula based on SCr of 0.74 mg/dL). Liver Function Tests: Recent Labs  Lab 07/09/21 1340 07/10/21 0814 07/11/21 0520 07/12/21 0524  AST 13* 13* 16 14*  ALT 7 6 11 6   ALKPHOS 51 52 48 43  BILITOT 1.2 1.1 0.8 0.8  PROT 7.0 7.3 6.8 6.4*  ALBUMIN 3.4* 3.5 3.2* 3.0*    No results for input(s): LIPASE, AMYLASE in the last 168 hours. No results for input(s): AMMONIA in the last 168 hours. Coagulation Profile: No results for input(s): INR, PROTIME in the last 168 hours. Cardiac Enzymes: No results for input(s): CKTOTAL, CKMB, CKMBINDEX, TROPONINI in the last 168 hours. BNP (last 3 results) No results for input(s): PROBNP in the last 8760 hours. HbA1C: No results for input(s): HGBA1C in the last 72 hours. CBG: Recent Labs  Lab 07/11/21 1140 07/11/21 1720 07/11/21 2112 07/12/21 0807 07/12/21 1128  GLUCAP 228* 211* 180* 171* 316*    Lipid Profile: No results for input(s): CHOL, HDL, LDLCALC, TRIG, CHOLHDL, LDLDIRECT in the last 72 hours. Thyroid Function Tests: No  results for input(s): TSH, T4TOTAL, FREET4, T3FREE, THYROIDAB in the last 72 hours. Anemia Panel: No results for input(s): VITAMINB12, FOLATE, FERRITIN, TIBC, IRON, RETICCTPCT in the last 72 hours. Sepsis Labs: Recent Labs  Lab 07/09/21 1340 07/09/21 1430 07/09/21 1640  PROCALCITON  --  0.16  --   LATICACIDVEN 1.0  --  0.9     Recent Results (from the past 240 hour(s))  Resp Panel by RT-PCR (Flu A&B, Covid) Nasopharyngeal Swab     Status: Abnormal   Collection Time: 07/10/21  3:44 AM   Specimen: Nasopharyngeal Swab; Nasopharyngeal(NP) swabs in vial transport medium  Result Value Ref Range Status   SARS Coronavirus 2 by RT PCR POSITIVE (A) NEGATIVE Final    Comment: (NOTE) SARS-CoV-2 target nucleic acids are DETECTED.  The SARS-CoV-2 RNA is generally detectable in upper respiratory specimens during the acute phase of infection. Positive results are indicative of the presence of the identified virus, but do not rule out bacterial infection or co-infection with other pathogens not detected by the test.  Clinical correlation with patient history and other diagnostic information is necessary to determine patient infection status. The expected result is Negative.  Fact Sheet for Patients: EntrepreneurPulse.com.au  Fact Sheet for Healthcare Providers: IncredibleEmployment.be  This test is not yet approved or cleared by the [place] FDA and  has been authorized for detection and/or diagnosis of SARS-CoV-2 by FDA under an Emergency Use Authorization (EUA).  This EUA will remain in effect (meaning this test can be used) for the duration of  the COVID-19 declaration under Section 564(b)(1) of the A ct, 21 U.S.C. section 360bbb-3(b)(1), unless the authorization is terminated or revoked sooner.     Influenza A by PCR NEGATIVE NEGATIVE Final   Influenza B by PCR NEGATIVE NEGATIVE Final    Comment: (NOTE) The Xpert Xpress SARS-CoV-2/FLU/RSV  plus assay is intended as an aid in the diagnosis of influenza from Nasopharyngeal swab specimens and should not be used as a sole basis for treatment. Nasal washings and aspirates are unacceptable for Xpert Xpress SARS-CoV-2/FLU/RSV testing.  Fact Sheet for Patients: EntrepreneurPulse.com.au  Fact Sheet for Healthcare Providers: IncredibleEmployment.be  This test is not yet approved or cleared by the [place] FDA and has been authorized for detection and/or diagnosis of SARS-CoV-2 by FDA under an Emergency Use Authorization (EUA). This EUA will remain in effect (meaning this test can be used) for the duration of the COVID-19 declaration under Section 564(b)(1) of the Act, 21 U.S.C. section 360bbb-3(b)(1), unless the authorization is terminated or revoked.  Performed at [facility], [address], [place], [place] [address]       Radiology Studies: No results found.  Scheduled Meds:  amantadine  100 mg Oral Daily   vitamin C  1,000 mg Oral Daily   Carbidopa-Levodopa ER  1 capsule Oral QID   And   Carbidopa-Levodopa ER  1 capsule Oral QID   enoxaparin (LOVENOX) injection  40 mg Subcutaneous Q24H   gabapentin  200 mg Oral TID   insulin aspart  0-20 Units Subcutaneous TID WC   insulin aspart  0-5 Units Subcutaneous QHS   insulin aspart  3 Units Subcutaneous TID WC   insulin glargine-yfgn  10 Units Subcutaneous Daily   metFORMIN  500 mg Oral BID WC   methylPREDNISolone (SOLU-MEDROL) injection  0.5 mg/kg Intravenous Q12H   Followed by   Derrill Memo ON 07/13/2021] predniSONE  50 mg Oral Daily   QUEtiapine  25 mg Oral QHS   sertraline  25 mg Oral Daily   zinc sulfate  220 mg Oral Daily   Continuous Infusions:  remdesivir 100 mg in NS 100 mL Stopped (07/12/21 1134)     LOS: 2 days   Marylu Lund, MD [facility] Pager On Amion  If 7PM-7AM, please contact night-coverage 07/12/2021, 4:19 PM

## 2021-07-12 NOTE — Progress Notes (Signed)
Received report from [facility] re: patient's chronic supra-pubic catheter is clogged with a mucus like substance, and patient is now voiding via his penis. Had night shift tech apply external catheter (day shift was using towels) and clean patient/get rid of towel method. RN Lonn Georgia informed me in report that attending physician was made aware of patient's condition, and attending entered orders for UA and urine culture which have both been done and results are pending. No further orders were received per RN Lonn Georgia.

## 2021-07-13 LAB — URINALYSIS, ROUTINE W REFLEX MICROSCOPIC
Glucose, UA: NEGATIVE mg/dL
Hgb urine dipstick: NEGATIVE
Ketones, ur: NEGATIVE mg/dL
Nitrite: NEGATIVE
Protein, ur: >=300 mg/dL — AB
Specific Gravity, Urine: 1.023 (ref 1.005–1.030)
Squamous Epithelial / HPF: NONE SEEN (ref 0–5)
pH: 9 — ABNORMAL HIGH (ref 5.0–8.0)

## 2021-07-13 LAB — CBC WITH DIFFERENTIAL/PLATELET
Abs Immature Granulocytes: 0.06 10*3/uL (ref 0.00–0.07)
Basophils Absolute: 0 10*3/uL (ref 0.0–0.1)
Basophils Relative: 0 %
Eosinophils Absolute: 0 10*3/uL (ref 0.0–0.5)
Eosinophils Relative: 0 %
HCT: 37.3 % — ABNORMAL LOW (ref 39.0–52.0)
Hemoglobin: 12.3 g/dL — ABNORMAL LOW (ref 13.0–17.0)
Immature Granulocytes: 1 %
Lymphocytes Relative: 11 %
Lymphs Abs: 0.9 10*3/uL (ref 0.7–4.0)
MCH: 31.5 pg (ref 26.0–34.0)
MCHC: 33 g/dL (ref 30.0–36.0)
MCV: 95.6 fL (ref 80.0–100.0)
Monocytes Absolute: 1 10*3/uL (ref 0.1–1.0)
Monocytes Relative: 13 %
Neutro Abs: 6 10*3/uL (ref 1.7–7.7)
Neutrophils Relative %: 75 %
Platelets: 182 10*3/uL (ref 150–400)
RBC: 3.9 MIL/uL — ABNORMAL LOW (ref 4.22–5.81)
RDW: 13.9 % (ref 11.5–15.5)
WBC: 8 10*3/uL (ref 4.0–10.5)
nRBC: 0 % (ref 0.0–0.2)

## 2021-07-13 LAB — GLUCOSE, CAPILLARY
Glucose-Capillary: 139 mg/dL — ABNORMAL HIGH (ref 70–99)
Glucose-Capillary: 143 mg/dL — ABNORMAL HIGH (ref 70–99)
Glucose-Capillary: 265 mg/dL — ABNORMAL HIGH (ref 70–99)
Glucose-Capillary: 273 mg/dL — ABNORMAL HIGH (ref 70–99)

## 2021-07-13 LAB — COMPREHENSIVE METABOLIC PANEL
ALT: <5 U/L (ref 0–44)
AST: 11 U/L — ABNORMAL LOW (ref 15–41)
Albumin: 3.1 g/dL — ABNORMAL LOW (ref 3.5–5.0)
Alkaline Phosphatase: 40 U/L (ref 38–126)
Anion gap: 7 (ref 5–15)
BUN: 38 mg/dL — ABNORMAL HIGH (ref 8–23)
CO2: 25 mmol/L (ref 22–32)
Calcium: 8.5 mg/dL — ABNORMAL LOW (ref 8.9–10.3)
Chloride: 104 mmol/L (ref 98–111)
Creatinine, Ser: 0.72 mg/dL (ref 0.61–1.24)
GFR, Estimated: >60 mL/min (ref >60)
Glucose, Bld: 158 mg/dL — ABNORMAL HIGH (ref 70–99)
Potassium: 4 mmol/L (ref 3.5–5.1)
Sodium: 136 mmol/L (ref 135–145)
Total Bilirubin: 0.6 mg/dL (ref 0.3–1.2)
Total Protein: 6.1 g/dL — ABNORMAL LOW (ref 6.5–8.1)

## 2021-07-13 LAB — C-REACTIVE PROTEIN: CRP: 2.1 mg/dL — ABNORMAL HIGH (ref <1.0)

## 2021-07-13 MED ORDER — SODIUM CHLORIDE 0.9 % IV SOLN
1.0000 g | INTRAVENOUS | Status: DC
  Administered 2021-07-13 – 2021-07-14 (×2): 1 g via INTRAVENOUS
  Filled 2021-07-13 (×2): qty 10
  Filled 2021-07-13: qty 1

## 2021-07-13 NOTE — Progress Notes (Signed)
Exchanged suprapubic cath... sterile technique. New 16 Fr cath inserted, urine returned. Scrotum noted excoriated.

## 2021-07-13 NOTE — Progress Notes (Signed)
PROGRESS NOTE    Roger Rodriguez  CZY:[id number] DOB: 11/21/1940 DOA: 07/09/2021 PCP: [name]., MD    Brief Narrative:  81 y.o. male with medical history significant for chronic suprapubic catheter, status post changed on 06/25/2021 outpatient, hypertension, history of depression, non-insulin-dependent diabetes mellitus, idiopathic Parkinson's disease, hepatic steatosis, history of infection of prosthetic joints, restless leg syndrome, pure hypercholesterolemia, who presents emergency department from home for chief concerns of shortness of breath. Pt was found to be covid pos  Assessment & Plan:   Principal Problem:   Acute hypoxemic respiratory failure due to COVID-19 [facility]) Active Problems:   Arthritis   Essential (primary) hypertension   Hyperlipidemia   Lumbar canal stenosis   Benign essential tremor   Parkinson's disease (HCC)   Depression, major, single episode, moderate ([place])   COVID-19  # Acute hypoxemic respiratory failure secondary to COVID-19 infection - continued with IV remdesivir  - Incentive spirometry and flutter valve for 10 reps every 2 hours while awake - Albuterol inhaler 2 puffs every 4 hours while awake - Follow daily labs: CMP, CBC, CRP - Supplemental oxygen to maintain SPO2 goal of greater than 92% - Airborne and contact precautions -Have ordered vitamin C and zinc -weaned to room air -Procal is improving, down to just over 2   # Parkinson's disease- - Patient was prescribed carbidopa/levodopa 25/250 mg 4 times daily, carbidopa/levodopa (Sinemet CR) 50/200 mg nightly resumed based on neurology outpatient note on 11/26/20 -Amantadine 100 mg daily -Remains stable at this time   # Parkinson's disease dementia-as above   # Anxiety/depression-sertraline daily # Restless leg syndrome-gabapentin 200 mg 3 times daily   # Non-insulin-dependent diabetes mellitus-metformin 500 mg p.o. twice daily - insulin SSI with at bedtime coverage  ordered -recent a1c of 7.1 -Glucose trends into the 200's. Appreciate diabetic coordinator recs -On long acting to 10 units and add 3 units meal coverage   # History of hypertension -Pt recently hypotensive requiring NS bolus -BP meds currently on hold for now  # Insomnia-quetiapine 25 mg nightly resumed  #hypotension -resolved with IVF -bp med on hold for now   DVT prophylaxis: Lovenox subq Code Status: Full Family Communication: Pt in room, have been updating family over phone  Status is: Inpatient  Remains inpatient appropriate because: Severity of illness    Consultants:    Procedures:    Antimicrobials: Anti-infectives (From admission, onward)    Start     Dose/Rate Route Frequency Ordered Stop   07/13/21 1000  cefTRIAXone (ROCEPHIN) 1 g in sodium chloride 0.9 % 100 mL IVPB        1 g 200 mL/hr over 30 Minutes Intravenous Every 24 hours 07/13/21 0749     07/10/21 1000  remdesivir 100 mg in sodium chloride 0.9 % 100 mL IVPB       See Hyperspace for full Linked Orders Report.   100 mg 200 mL/hr over 30 Minutes Intravenous Daily 07/09/21 1942 07/13/21 1021   07/09/21 1945  remdesivir 200 mg in sodium chloride 0.9% 250 mL IVPB       See Hyperspace for full Linked Orders Report.   200 mg 580 mL/hr over 30 Minutes Intravenous Once 07/09/21 1942 07/10/21 0030       Subjective: Reports feeling better  Objective: Vitals:   07/12/21 1848 07/12/21 2029 07/13/21 0400 07/13/21 0911  BP: 134/70 130/70 140/72 (!) 148/83  Pulse: 96 90 85 79  Resp: 17   16  Temp: 97.9 F (36.6 C)  98 F (36.7 C) 97.9 F (36.6 C) 98.3 F (36.8 C)  TempSrc: Oral   Oral  SpO2: 95% 95% 95% 98%  Weight:      Height:        Intake/Output Summary (Last 24 hours) at 07/13/2021 1738 Last data filed at 07/13/2021 1300 Gross per 24 hour  Intake --  Output 801 ml  Net -801 ml    Filed Weights   07/09/21 1337  Weight: 74.8 kg    Examination: General exam: Awake, laying in bed, in  nad Respiratory system: Normal respiratory effort, no wheezing Cardiovascular system: regular rate, s1, s2 Gastrointestinal system: Soft, nondistended, positive BS Central nervous system: CN2-12 grossly intact, strength intact Extremities: Perfused, no clubbing Skin: Normal skin turgor, no notable skin lesions seen Psychiatry: Mood normal // no visual hallucinations   Data Reviewed: I have personally reviewed following labs and imaging studies  CBC: Recent Labs  Lab 07/09/21 1340 07/10/21 0814 07/11/21 0520 07/12/21 0524 07/13/21 0534  WBC 8.0 7.0 7.4 8.1 8.0  NEUTROABS 6.1 6.2 6.4 7.2 6.0  HGB 13.9 13.7 12.8* 12.7* 12.3*  HCT 42.6 41.5 39.1 38.1* 37.3*  MCV 96.6 94.5 94.7 96.7 95.6  PLT 156 160 171 174 983    Basic Metabolic Panel: Recent Labs  Lab 07/09/21 1340 07/10/21 0814 07/11/21 0520 07/12/21 0524 07/13/21 0534  NA 132* 131* 137 136 136  K 3.9 4.3 4.1 4.2 4.0  CL 98 98 106 106 104  CO2 26 20* 22 24 25   GLUCOSE 143* 187* 224* 208* 158*  BUN 33* 35* 34* 34* 38*  CREATININE 1.04 0.90 0.90 0.74 0.72  CALCIUM 8.5* 8.3* 8.2* 8.2* 8.5*    GFR: Estimated Creatinine Clearance: 76.6 mL/min (by C-G formula based on SCr of 0.72 mg/dL). Liver Function Tests: Recent Labs  Lab 07/09/21 1340 07/10/21 0814 07/11/21 0520 07/12/21 0524 07/13/21 0534  AST 13* 13* 16 14* 11*  ALT 7 6 11 6  <5  ALKPHOS 51 52 48 43 40  BILITOT 1.2 1.1 0.8 0.8 0.6  PROT 7.0 7.3 6.8 6.4* 6.1*  ALBUMIN 3.4* 3.5 3.2* 3.0* 3.1*    No results for input(s): LIPASE, AMYLASE in the last 168 hours. No results for input(s): AMMONIA in the last 168 hours. Coagulation Profile: No results for input(s): INR, PROTIME in the last 168 hours. Cardiac Enzymes: No results for input(s): CKTOTAL, CKMB, CKMBINDEX, TROPONINI in the last 168 hours. BNP (last 3 results) No results for input(s): PROBNP in the last 8760 hours. HbA1C: No results for input(s): HGBA1C in the last 72 hours. CBG: Recent Labs   Lab 07/12/21 1128 07/12/21 1718 07/12/21 2133 07/13/21 0915 07/13/21 1229  GLUCAP 316* 242* 357* 139* 143*    Lipid Profile: No results for input(s): CHOL, HDL, LDLCALC, TRIG, CHOLHDL, LDLDIRECT in the last 72 hours. Thyroid Function Tests: No results for input(s): TSH, T4TOTAL, FREET4, T3FREE, THYROIDAB in the last 72 hours. Anemia Panel: No results for input(s): VITAMINB12, FOLATE, FERRITIN, TIBC, IRON, RETICCTPCT in the last 72 hours. Sepsis Labs: Recent Labs  Lab 07/09/21 1340 07/09/21 1430 07/09/21 1640  PROCALCITON  --  0.16  --   LATICACIDVEN 1.0  --  0.9     Recent Results (from the past 240 hour(s))  Resp Panel by RT-PCR (Flu A&B, Covid) Nasopharyngeal Swab     Status: Abnormal   Collection Time: 07/10/21  3:44 AM   Specimen: Nasopharyngeal Swab; Nasopharyngeal(NP) swabs in vial transport medium  Result Value Ref Range Status  SARS Coronavirus 2 by RT PCR POSITIVE (A) NEGATIVE Final    Comment: (NOTE) SARS-CoV-2 target nucleic acids are DETECTED.  The SARS-CoV-2 RNA is generally detectable in upper respiratory specimens during the acute phase of infection. Positive results are indicative of the presence of the identified virus, but do not rule out bacterial infection or co-infection with other pathogens not detected by the test. Clinical correlation with patient history and other diagnostic information is necessary to determine patient infection status. The expected result is Negative.  Fact Sheet for Patients: EntrepreneurPulse.com.au  Fact Sheet for Healthcare Providers: IncredibleEmployment.be  This test is not yet approved or cleared by the [place] FDA and  has been authorized for detection and/or diagnosis of SARS-CoV-2 by FDA under an Emergency Use Authorization (EUA).  This EUA will remain in effect (meaning this test can be used) for the duration of  the COVID-19 declaration under Section 564(b)(1) of  the A ct, 21 U.S.C. section 360bbb-3(b)(1), unless the authorization is terminated or revoked sooner.     Influenza A by PCR NEGATIVE NEGATIVE Final   Influenza B by PCR NEGATIVE NEGATIVE Final    Comment: (NOTE) The Xpert Xpress SARS-CoV-2/FLU/RSV plus assay is intended as an aid in the diagnosis of influenza from Nasopharyngeal swab specimens and should not be used as a sole basis for treatment. Nasal washings and aspirates are unacceptable for Xpert Xpress SARS-CoV-2/FLU/RSV testing.  Fact Sheet for Patients: EntrepreneurPulse.com.au  Fact Sheet for Healthcare Providers: IncredibleEmployment.be  This test is not yet approved or cleared by the [place] FDA and has been authorized for detection and/or diagnosis of SARS-CoV-2 by FDA under an Emergency Use Authorization (EUA). This EUA will remain in effect (meaning this test can be used) for the duration of the COVID-19 declaration under Section 564(b)(1) of the Act, 21 U.S.C. section 360bbb-3(b)(1), unless the authorization is terminated or revoked.  Performed at [facility], [address], [place], [place] [address]       Radiology Studies: No results found.  Scheduled Meds:  amantadine  100 mg Oral Daily   vitamin C  1,000 mg Oral Daily   Carbidopa-Levodopa ER  1 capsule Oral QID   And   Carbidopa-Levodopa ER  1 capsule Oral QID   enoxaparin (LOVENOX) injection  40 mg Subcutaneous Q24H   gabapentin  200 mg Oral TID   insulin aspart  0-20 Units Subcutaneous TID WC   insulin aspart  0-5 Units Subcutaneous QHS   insulin aspart  3 Units Subcutaneous TID WC   insulin glargine-yfgn  10 Units Subcutaneous Daily   metFORMIN  500 mg Oral BID WC   predniSONE  50 mg Oral Daily   QUEtiapine  25 mg Oral QHS   sertraline  25 mg Oral Daily   zinc sulfate  220 mg Oral Daily   Continuous Infusions:  cefTRIAXone (ROCEPHIN)  IV 1 g (07/13/21 1129)     LOS: 3 days    Marylu Lund, MD [facility] Pager On Amion  If 7PM-7AM, please contact night-coverage 07/13/2021, 5:38 PM

## 2021-07-14 DIAGNOSIS — M1990 Unspecified osteoarthritis, unspecified site: Secondary | ICD-10-CM

## 2021-07-14 LAB — COMPREHENSIVE METABOLIC PANEL
ALT: 6 U/L (ref 0–44)
AST: 19 U/L (ref 15–41)
Albumin: 3.1 g/dL — ABNORMAL LOW (ref 3.5–5.0)
Alkaline Phosphatase: 49 U/L (ref 38–126)
Anion gap: 9 (ref 5–15)
BUN: 34 mg/dL — ABNORMAL HIGH (ref 8–23)
CO2: 28 mmol/L (ref 22–32)
Calcium: 8.5 mg/dL — ABNORMAL LOW (ref 8.9–10.3)
Chloride: 98 mmol/L (ref 98–111)
Creatinine, Ser: 0.84 mg/dL (ref 0.61–1.24)
GFR, Estimated: >60 mL/min (ref >60)
Glucose, Bld: 165 mg/dL — ABNORMAL HIGH (ref 70–99)
Potassium: 3.6 mmol/L (ref 3.5–5.1)
Sodium: 135 mmol/L (ref 135–145)
Total Bilirubin: 0.8 mg/dL (ref 0.3–1.2)
Total Protein: 6.3 g/dL — ABNORMAL LOW (ref 6.5–8.1)

## 2021-07-14 LAB — CBC WITH DIFFERENTIAL/PLATELET
Abs Immature Granulocytes: 0.16 10*3/uL — ABNORMAL HIGH (ref 0.00–0.07)
Basophils Absolute: 0 10*3/uL (ref 0.0–0.1)
Basophils Relative: 0 %
Eosinophils Absolute: 0 10*3/uL (ref 0.0–0.5)
Eosinophils Relative: 0 %
HCT: 37.5 % — ABNORMAL LOW (ref 39.0–52.0)
Hemoglobin: 12.5 g/dL — ABNORMAL LOW (ref 13.0–17.0)
Immature Granulocytes: 2 %
Lymphocytes Relative: 16 %
Lymphs Abs: 1.5 10*3/uL (ref 0.7–4.0)
MCH: 31.4 pg (ref 26.0–34.0)
MCHC: 33.3 g/dL (ref 30.0–36.0)
MCV: 94.2 fL (ref 80.0–100.0)
Monocytes Absolute: 1.3 10*3/uL — ABNORMAL HIGH (ref 0.1–1.0)
Monocytes Relative: 13 %
Neutro Abs: 6.4 10*3/uL (ref 1.7–7.7)
Neutrophils Relative %: 69 %
Platelets: 180 10*3/uL (ref 150–400)
RBC: 3.98 MIL/uL — ABNORMAL LOW (ref 4.22–5.81)
RDW: 13.6 % (ref 11.5–15.5)
WBC: 9.3 10*3/uL (ref 4.0–10.5)
nRBC: 0 % (ref 0.0–0.2)

## 2021-07-14 LAB — URINE CULTURE

## 2021-07-14 LAB — GLUCOSE, CAPILLARY
Glucose-Capillary: 118 mg/dL — ABNORMAL HIGH (ref 70–99)
Glucose-Capillary: 138 mg/dL — ABNORMAL HIGH (ref 70–99)

## 2021-07-14 LAB — C-REACTIVE PROTEIN: CRP: 1.2 mg/dL — ABNORMAL HIGH (ref <1.0)

## 2021-07-14 MED ORDER — CEFDINIR 300 MG PO CAPS: 300.0000 mg | ORAL_CAPSULE | Freq: Two times a day (BID) | ORAL | 0 refills | Status: AC

## 2021-07-14 MED ORDER — ZINC SULFATE 220 (50 ZN) MG PO CAPS: 220.0000 mg | ORAL_CAPSULE | Freq: Every day | ORAL | 0 refills | Status: DC

## 2021-07-14 MED ORDER — BENZONATATE 100 MG PO CAPS: 100.0000 mg | ORAL_CAPSULE | Freq: Three times a day (TID) | ORAL | 0 refills | Status: DC | PRN

## 2021-07-14 NOTE — Discharge Summary (Signed)
Physician Discharge Summary  Roger Rodriguez DZH:[id number] DOB: Jan 11, 1941 DOA: 07/09/2021  PCP: [name]., MD  Admit date: 07/09/2021 Discharge date: 07/14/2021  Admitted From: Home Disposition:  Home  Recommendations for Outpatient Follow-up:  Follow up with PCP in 1-2 weeks  Discharge Condition:Improved  CODE STATUS:Full Diet recommendation: Carb mod   Brief/Interim Summary: 81 y.o. male with medical history significant for chronic suprapubic catheter, status post changed on 06/25/2021 outpatient, hypertension, history of depression, non-insulin-dependent diabetes mellitus, idiopathic Parkinson's disease, hepatic steatosis, history of infection of prosthetic joints, restless leg syndrome, pure hypercholesterolemia, who presents emergency department from home for chief concerns of shortness of breath. Pt was found to be covid pos  Discharge Diagnoses:  Principal Problem:   Acute hypoxemic respiratory failure due to COVID-19 [facility]) Active Problems:   Arthritis   Essential (primary) hypertension   Hyperlipidemia   Lumbar canal stenosis   Benign essential tremor   Parkinson's disease (HCC)   Depression, major, single episode, moderate ([place])   COVID-19  # Acute hypoxemic respiratory failure secondary to COVID-19 infection - was continued with IV remdesivir  - Incentive spirometry and flutter valve for 10 reps every 2 hours while awake - Albuterol inhaler given as needed - Supplemental oxygen was weaned to off -Had continued on vitamin C and zinc -Procal is improved, down to just over 1   # Parkinson's disease- - Patient was prescribed carbidopa/levodopa 25/250 mg 4 times daily, carbidopa/levodopa (Sinemet CR) 50/200 mg nightly resumed based on neurology outpatient note on 11/26/20 -Amantadine 100 mg daily -Remains stable at this time   # Parkinson's disease dementia-as above   # Anxiety/depression-sertraline daily # Restless leg syndrome-gabapentin 200 mg 3  times daily   # Non-insulin-dependent diabetes mellitus-metformin 500 mg p.o. twice daily - insulin SSI with at bedtime coverage ordered -recent a1c of 7.1 -cont home meds on d/c   # History of hypertension -Pt recently hypotensive requiring NS bolus -BP now much improved. Cont home meds on d/c   # Insomnia-quetiapine 25 mg nightly resumed   #hypotension -resolved with IVF -resume home meds on d/c as BP improved    Discharge Instructions   Allergies as of 07/14/2021       Reactions   Sulfa Antibiotics Swelling   Lip swelling        Medication List     STOP taking these medications    celecoxib 100 MG capsule Commonly known as: CELEBREX   insulin aspart 100 UNIT/ML injection Commonly known as: novoLOG   mirabegron ER 25 MG Tb24 tablet Commonly known as: MYRBETRIQ   nitrofurantoin (macrocrystal-monohydrate) 100 MG capsule Commonly known as: MACROBID   Paxlovid (300/100) 20 x 150 MG & 10 x 100MG  Tbpk Generic drug: nirmatrelvir & ritonavir       TAKE these medications    amantadine 100 MG capsule Commonly known as: SYMMETREL Take 100 mg by mouth daily as needed. What changed: Another medication with the same name was removed. Continue taking this medication, and follow the directions you see here.   ARTIFICIAL TEARS OP Place 1 drop into both eyes 4 (four) times daily as needed (for dry eyes).   atropine 1 % ophthalmic solution Place 1 drop under the tongue at bedtime.   benzonatate 100 MG capsule Commonly known as: Tessalon Perles Take 1 capsule (100 mg total) by mouth 3 (three) times daily as needed for cough.   budesonide 32 MCG/ACT nasal spray Commonly known as: RHINOCORT AQUA USE 1 PUFF IN [facility]  NOSTRIL EVERY DAY AS NEEDED What changed: See the new instructions.   busPIRone 7.5 MG tablet Commonly known as: BUSPAR Take 7.5 mg by mouth 2 (two) times daily. To 3 times daily   cefdinir 300 MG capsule Commonly known as: OMNICEF Take 1 capsule  (300 mg total) by mouth 2 (two) times daily for 1 day.   cholecalciferol 1000 units tablet Commonly known as: VITAMIN D Take 1,000 Units by mouth daily.   enalapril 2.5 MG tablet Commonly known as: VASOTEC Take 1 tablet (2.5 mg total) by mouth 2 (two) times daily.   gabapentin 100 MG capsule Commonly known as: NEURONTIN Take 200 mg by mouth 3 (three) times daily.   glucose blood test strip Commonly known as: Quintet AC Blood Glucose Test Use as instructed   hydrocortisone 2.5 % lotion Apply to the eyebrows, mid face, and beard area QHS on Tuesday, Thursday, and Saturday.   ketoconazole 2 % cream Commonly known as: NIZORAL Apply 1 application topically 2 (two) times daily.   loratadine 10 MG tablet Commonly known as: CLARITIN Take 10 mg by mouth daily.   metFORMIN 500 MG tablet Commonly known as: GLUCOPHAGE TAKE 1 TABLET BY MOUTH 2 TIMES DAILY WITH A MEAL.   mometasone 0.1 % cream Commonly known as: ELOCON Apply to scaly rash area on neck once to twice daily as needed. Avoid the face, groin, and axilla.   multivitamin with minerals Tabs tablet Take 1 tablet by mouth daily.   polyethylene glycol 17 g packet Commonly known as: MIRALAX / GLYCOLAX Take 17 g by mouth daily as needed.   pramipexole 0.5 MG tablet Commonly known as: MIRAPEX Take 2 tablets (1 mg total) by mouth at bedtime.   PRESCRIPTION MEDICATION Take 2 tablets by mouth in the morning, at noon, in the evening, and at bedtime.   QUEtiapine 25 MG tablet Commonly known as: SEROQUEL Take 25 mg by mouth at bedtime.   Rytary 23.75-95 MG Cpcr Generic drug: Carbidopa-Levodopa ER Take 2 capsules by mouth 2 (two) times daily. What changed: Another medication with the same name was removed. Continue taking this medication, and follow the directions you see here.   sertraline 25 MG tablet Commonly known as: ZOLOFT Take 25 mg by mouth daily.   vitamin B-12 1000 MCG tablet Commonly known as:  CYANOCOBALAMIN Take 1,000 mcg by mouth daily.   vitamin C 500 MG tablet Commonly known as: ASCORBIC ACID Take 1,000 mg by mouth daily.   vitamin E 180 MG (400 UNITS) capsule Take 400 Units by mouth daily.   zinc sulfate 220 (50 Zn) MG capsule Take 1 capsule (220 mg total) by mouth daily. Start taking on: July 15, 2021        Follow-up Information     [name]., MD Follow up in 2 week(s).   Specialty: Family Medicine Why: Hospital follow up Contact information: [address] [place] [place] [address] [phone]                Allergies  Allergen Reactions   Sulfa Antibiotics Swelling    Lip swelling    Procedures/Studies: DG Chest 2 View  Result Date: 07/09/2021 CLINICAL DATA:  Shortness of breath.  COVID. EXAM: CHEST - 2 VIEW COMPARISON:  One-view chest x-ray 10/15/2017 FINDINGS: Heart size is normal. Mild pulmonary vascular congestion is present. New left upper lobe and bilateral lower lobe airspace opacities are present. Lung volumes are low. Degenerative and postoperative changes are noted in the  right shoulder. IMPRESSION: 1. New left upper lobe and bilateral lower lobe airspace disease compatible with pneumonia. This may be related to viral disease. 2. Low lung volumes and mild pulmonary vascular congestion. Electronically Signed   By: San Morelle M.D.   On: 07/09/2021 14:14    Subjective: Eager to go home  Discharge Exam: Vitals:   07/14/21 0433 07/14/21 0824  BP: (!) 162/96 (!) 156/91  Pulse: 83 85  Resp: 16 12  Temp: 98 F (36.7 C) 97.9 F (36.6 C)  SpO2: 97% 99%   Vitals:   07/13/21 0911 07/13/21 2033 07/14/21 0433 07/14/21 0824  BP: (!) 148/83 (!) 174/90 (!) 162/96 (!) 156/91  Pulse: 79 95 83 85  Resp: 16 16 16 12   Temp: 98.3 F (36.8 C) 98 F (36.7 C) 98 F (36.7 C) 97.9 F (36.6 C)  TempSrc: Oral Oral Oral Oral  SpO2: 98% 96% 97% 99%  Weight:      Height:        General: Pt is alert, awake,  not in acute distress Cardiovascular: RRR, S1/S2  Respiratory: CTA bilaterally, no wheezing, no rhonchi Abdominal: Soft, NT, ND, bowel sounds + Extremities: no edema, no cyanosis   The results of significant diagnostics from this hospitalization (including imaging, microbiology, ancillary and laboratory) are listed below for reference.     Microbiology: Recent Results (from the past 240 hour(s))  Resp Panel by RT-PCR (Flu A&B, Covid) Nasopharyngeal Swab     Status: Abnormal   Collection Time: 07/10/21  3:44 AM   Specimen: Nasopharyngeal Swab; Nasopharyngeal(NP) swabs in vial transport medium  Result Value Ref Range Status   SARS Coronavirus 2 by RT PCR POSITIVE (A) NEGATIVE Final    Comment: (NOTE) SARS-CoV-2 target nucleic acids are DETECTED.  The SARS-CoV-2 RNA is generally detectable in upper respiratory specimens during the acute phase of infection. Positive results are indicative of the presence of the identified virus, but do not rule out bacterial infection or co-infection with other pathogens not detected by the test. Clinical correlation with patient history and other diagnostic information is necessary to determine patient infection status. The expected result is Negative.  Fact Sheet for Patients: EntrepreneurPulse.com.au  Fact Sheet for Healthcare Providers: IncredibleEmployment.be  This test is not yet approved or cleared by the [place] FDA and  has been authorized for detection and/or diagnosis of SARS-CoV-2 by FDA under an Emergency Use Authorization (EUA).  This EUA will remain in effect (meaning this test can be used) for the duration of  the COVID-19 declaration under Section 564(b)(1) of the A ct, 21 U.S.C. section 360bbb-3(b)(1), unless the authorization is terminated or revoked sooner.     Influenza A by PCR NEGATIVE NEGATIVE Final   Influenza B by PCR NEGATIVE NEGATIVE Final    Comment: (NOTE) The Xpert  Xpress SARS-CoV-2/FLU/RSV plus assay is intended as an aid in the diagnosis of influenza from Nasopharyngeal swab specimens and should not be used as a sole basis for treatment. Nasal washings and aspirates are unacceptable for Xpert Xpress SARS-CoV-2/FLU/RSV testing.  Fact Sheet for Patients: EntrepreneurPulse.com.au  Fact Sheet for Healthcare Providers: IncredibleEmployment.be  This test is not yet approved or cleared by the [place] FDA and has been authorized for detection and/or diagnosis of SARS-CoV-2 by FDA under an Emergency Use Authorization (EUA). This EUA will remain in effect (meaning this test can be used) for the duration of the COVID-19 declaration under Section 564(b)(1) of the Act, 21 U.S.C. section 360bbb-3(b)(1), unless the authorization  is terminated or revoked.  Performed at [facility], [address] [place], [place] [address]   Urine Culture     Status: Abnormal   Collection Time: 07/12/21  3:42 PM   Specimen: Urine, Random  Result Value Ref Range Status   Specimen Description   Final    URINE, RANDOM Performed at [facility], [address] [place], [place] [address]    Special Requests   Final    NONE Performed at [facility], [address] [place], [place] [address]    Culture MULTIPLE SPECIES PRESENT, SUGGEST RECOLLECTION (A)  Final   Report Status 07/14/2021 FINAL  Final     Labs: BNP (last 3 results) Recent Labs    07/09/21 1340  BNP 947.6*   Basic Metabolic Panel: Recent Labs  Lab 07/10/21 0814 07/11/21 0520 07/12/21 0524 07/13/21 0534 07/14/21 0515  NA 131* 137 136 136 135  K 4.3 4.1 4.2 4.0 3.6  CL 98 106 106 104 98  CO2 20* 22 24 25 28   GLUCOSE 187* 224* 208* 158* 165*  BUN 35* 34* 34* 38* 34*  CREATININE 0.90 0.90 0.74 0.72 0.84  CALCIUM 8.3* 8.2* 8.2* 8.5* 8.5*   Liver Function Tests: Recent Labs  Lab 07/10/21 0814 07/11/21 0520  07/12/21 0524 07/13/21 0534 07/14/21 0515  AST 13* 16 14* 11* 19  ALT 6 11 6  <5 6  ALKPHOS 52 48 43 40 49  BILITOT 1.1 0.8 0.8 0.6 0.8  PROT 7.3 6.8 6.4* 6.1* 6.3*  ALBUMIN 3.5 3.2* 3.0* 3.1* 3.1*   No results for input(s): LIPASE, AMYLASE in the last 168 hours. No results for input(s): AMMONIA in the last 168 hours. CBC: Recent Labs  Lab 07/10/21 0814 07/11/21 0520 07/12/21 0524 07/13/21 0534 07/14/21 0515  WBC 7.0 7.4 8.1 8.0 9.3  NEUTROABS 6.2 6.4 7.2 6.0 6.4  HGB 13.7 12.8* 12.7* 12.3* 12.5*  HCT 41.5 39.1 38.1* 37.3* 37.5*  MCV 94.5 94.7 96.7 95.6 94.2  PLT 160 171 174 182 180   Cardiac Enzymes: No results for input(s): CKTOTAL, CKMB, CKMBINDEX, TROPONINI in the last 168 hours. BNP: Invalid input(s): POCBNP CBG: Recent Labs  Lab 07/13/21 1229 07/13/21 1744 07/13/21 2138 07/14/21 0822 07/14/21 1148  GLUCAP 143* 265* 273* 118* 138*   D-Dimer No results for input(s): DDIMER in the last 72 hours. Hgb A1c No results for input(s): HGBA1C in the last 72 hours. Lipid Profile No results for input(s): CHOL, HDL, LDLCALC, TRIG, CHOLHDL, LDLDIRECT in the last 72 hours. Thyroid function studies No results for input(s): TSH, T4TOTAL, T3FREE, THYROIDAB in the last 72 hours.  Invalid input(s): FREET3 Anemia work up No results for input(s): VITAMINB12, FOLATE, FERRITIN, TIBC, IRON, RETICCTPCT in the last 72 hours. Urinalysis    Component Value Date/Time   COLORURINE BROWN (A) 07/12/2021 1542   APPEARANCEUR TURBID (A) 07/12/2021 1542   APPEARANCEUR Cloudy (A) 06/25/2021 1547   LABSPEC 1.023 07/12/2021 1542   LABSPEC 1.013 03/21/2014 1433   PHURINE 9.0 (H) 07/12/2021 1542   GLUCOSEU NEGATIVE 07/12/2021 1542   GLUCOSEU Negative 03/21/2014 1433   HGBUR NEGATIVE 07/12/2021 1542   BILIRUBINUR MODERATE (A) 07/12/2021 1542   BILIRUBINUR Negative 06/25/2021 1547   BILIRUBINUR Negative 03/21/2014 1433   KETONESUR NEGATIVE 07/12/2021 1542   PROTEINUR >=300 (A)  07/12/2021 1542   UROBILINOGEN 0.2 06/02/2018 1601   NITRITE NEGATIVE 07/12/2021 1542   LEUKOCYTESUR LARGE (A) 07/12/2021 1542   LEUKOCYTESUR Trace 03/21/2014 1433   Sepsis Labs  Invalid input(s): PROCALCITONIN,  WBC,  LACTICIDVEN Microbiology Recent Results (from the past 240 hour(s))  Resp Panel by RT-PCR (Flu A&B, Covid) Nasopharyngeal Swab     Status: Abnormal   Collection Time: 07/10/21  3:44 AM   Specimen: Nasopharyngeal Swab; Nasopharyngeal(NP) swabs in vial transport medium  Result Value Ref Range Status   SARS Coronavirus 2 by RT PCR POSITIVE (A) NEGATIVE Final    Comment: (NOTE) SARS-CoV-2 target nucleic acids are DETECTED.  The SARS-CoV-2 RNA is generally detectable in upper respiratory specimens during the acute phase of infection. Positive results are indicative of the presence of the identified virus, but do not rule out bacterial infection or co-infection with other pathogens not detected by the test. Clinical correlation with patient history and other diagnostic information is necessary to determine patient infection status. The expected result is Negative.  Fact Sheet for Patients: EntrepreneurPulse.com.au  Fact Sheet for Healthcare Providers: IncredibleEmployment.be  This test is not yet approved or cleared by the [place] FDA and  has been authorized for detection and/or diagnosis of SARS-CoV-2 by FDA under an Emergency Use Authorization (EUA).  This EUA will remain in effect (meaning this test can be used) for the duration of  the COVID-19 declaration under Section 564(b)(1) of the A ct, 21 U.S.C. section 360bbb-3(b)(1), unless the authorization is terminated or revoked sooner.     Influenza A by PCR NEGATIVE NEGATIVE Final   Influenza B by PCR NEGATIVE NEGATIVE Final    Comment: (NOTE) The Xpert Xpress SARS-CoV-2/FLU/RSV plus assay is intended as an aid in the diagnosis of influenza from Nasopharyngeal swab  specimens and should not be used as a sole basis for treatment. Nasal washings and aspirates are unacceptable for Xpert Xpress SARS-CoV-2/FLU/RSV testing.  Fact Sheet for Patients: EntrepreneurPulse.com.au  Fact Sheet for Healthcare Providers: IncredibleEmployment.be  This test is not yet approved or cleared by the [place] FDA and has been authorized for detection and/or diagnosis of SARS-CoV-2 by FDA under an Emergency Use Authorization (EUA). This EUA will remain in effect (meaning this test can be used) for the duration of the COVID-19 declaration under Section 564(b)(1) of the Act, 21 U.S.C. section 360bbb-3(b)(1), unless the authorization is terminated or revoked.  Performed at [facility], [address], [place], [place] [address]   Urine Culture     Status: Abnormal   Collection Time: 07/12/21  3:42 PM   Specimen: Urine, Random  Result Value Ref Range Status   Specimen Description   Final    URINE, RANDOM Performed at [facility], [address], [place], [place] [address]    Special Requests   Final    NONE Performed at [facility], [address] [place], [place] [address]    Culture MULTIPLE SPECIES PRESENT, SUGGEST RECOLLECTION (A)  Final   Report Status 07/14/2021 FINAL  Final   Time spent: 81min  SIGNED:   Marylu Lund, MD  [facility] 07/14/2021, 2:27 PM  If 7PM-7AM, please contact night-coverage

## 2021-07-14 NOTE — Plan of Care (Signed)
Reviewed discharge instructions with son via phone. Suprapubic catheter in place and draining freely at time of discharge. All home meds, patients glasses, and other miscellaneous things were sent with patient. See documentation for discharge vitals. Patient discharged home via EMS.

## 2021-07-14 NOTE — TOC Initial Note (Signed)
Transition of Care [facility]) - Initial/Assessment Note    Patient Details  Name: Roger Rodriguez MRN: [id number] Date of Birth: 01-14-1941  [facility] CM/SW Contact:    Beverly Sessions, RN Phone Number: 07/14/2021, 2:52 PM  Clinical Narrative:                  Patient to discharge today EMS forms printed and EMS called for 5pm  Patient will be returning home with his son and his private caregiver. Patient states that he has all DME needed        Patient Goals and CMS Choice        Expected Discharge Plan and Services           Expected Discharge Date: 07/14/21                                    Prior Living Arrangements/Services                       Activities of Daily Living Home Assistive Devices/Equipment: [facility] bed, Wheelchair, Other (Comment) (manual hoyer) ADL Screening (condition at time of admission) Patient's cognitive ability adequate to safely complete daily activities?: Yes Is the patient deaf or have difficulty hearing?: Yes Does the patient have difficulty seeing, even when wearing glasses/contacts?: No Does the patient have difficulty concentrating, remembering, or making decisions?: Yes Patient able to express need for assistance with ADLs?: Yes Does the patient have difficulty dressing or bathing?: Yes Independently performs ADLs?: No Communication: Independent Dressing (OT): Dependent Is this a change from baseline?: Pre-admission baseline Grooming: Dependent Is this a change from baseline?: Pre-admission baseline Feeding: Dependent Is this a change from baseline?: Pre-admission baseline Bathing: Dependent Is this a change from baseline?: Pre-admission baseline Toileting: Dependent Is this a change from baseline?: Pre-admission baseline In/Out Bed: Dependent Is this a change from baseline?: Pre-admission baseline Walks in Home: Dependent Is this a change from baseline?: Pre-admission  baseline Does the patient have difficulty walking or climbing stairs?: Yes Weakness of Legs: Both Weakness of Arms/Hands: Both  Permission Sought/Granted                  Emotional Assessment              Admission diagnosis:  Acute respiratory failure with hypoxemia ([place]) [J96.01] Acute hypoxemic respiratory failure due to COVID-19 ([place]) [U07.1, J96.01] COVID-19 [U07.1] Patient Active Problem List   Diagnosis Date Noted   COVID-19 07/10/2021   Acute hypoxemic respiratory failure due to COVID-19 ([place]) 07/09/2021   Abnormal gait 01/12/2020   Knee stiff 01/12/2020   S/P revision of total knee, 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[place]) 03/17/2018   Prosthetic joint infection ([place]) 03/16/2018   Infection or inflammatory reaction due to internal joint prosthesis ([place]) 03/16/2018   Chronic venous insufficiency 11/22/2017   ARF (acute renal failure) ([place]) 10/12/2017   RUQ pain 10/22/2016   Depression, major, single episode, moderate ([place]) 10/22/2016   Renal cyst, right 09/28/2016   Hepatic steatosis 09/28/2016   Parkinson's disease ([place]) 09/18/2015   Restless leg 09/18/2015   Malignant neoplasm of prostate ([place]) 05/22/2015   Allergic rhinitis 04/02/2015   Arthritis 04/02/2015   Narrowing of intervertebral disc space 04/02/2015   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   Essential (primary) hypertension 04/02/2015   Borderline diabetes 04/02/2015   Hyperlipidemia 04/02/2015  BP (high blood pressure) 04/02/2015   Infected sebaceous cyst 04/02/2015   Hernia, inguinal 04/02/2015   Knee pain 04/02/2015   Lumbar canal stenosis 04/02/2015   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   Malignant melanoma ([place]) 04/02/2015   Arthritis, degenerative 04/02/2015   Fatigue 04/02/2015   Idiopathic Parkinson's disease ([place]) 04/02/2015   Plantar fasciitis 04/02/2015   CA of prostate ([place]) 04/02/2015   Benign essential tremor 04/02/2015   Lower urinary tract  infection 04/02/2015   Degeneration of intervertebral disc of lumbosacral region 05/10/2012   PCP:  [name]., MD Pharmacy:   [id number] [address], [place] [address] Phone: [phone] Fax: [place], [place] [address] Phone: [phone] Fax: [phone]     Social Determinants of Health (SDOH) Interventions    Readmission Risk Interventions No flowsheet data found.

## 2021-07-14 NOTE — Care Management Important Message (Signed)
Important Message  Patient Details  Name: DAMASO LADAY MRN: [id number] Date of Birth: [birth year]   Medicare Important Message Given:  Yes  Reviewed with patient via room phone due to isolation status.  Copy of Medicare IM to be delivered to patient's room via nursing staff.     Dannette Barbara 07/14/2021, 3:12 PM

## 2021-07-15 ENCOUNTER — Telehealth: Payer: Self-pay

## 2021-07-15 NOTE — Telephone Encounter (Signed)
Transition Care Management Follow-up Telephone Call Date of discharge and from where: 07/14/21 [facility] How have you been since you were released from the hospital? Pt states he feels  Any questions or concerns? Yes - pt states his catheter may need to be adjusted by urologist but it is currently functioning. Advised to contact their office.   Items Reviewed: Did the pt receive and understand the discharge instructions provided? Yes  Medications obtained and verified? Yes  Other? No  Any new allergies since your discharge? No  Dietary orders reviewed? Yes Do you have support at home? Yes   Home Care and Equipment/Supplies: Were home health services ordered? No - patient has private caregiver  Were any new equipment or medical supplies ordered?  No   Functional Questionnaire: (I = Independent and D = Dependent) ADLs: D  Bathing/Dressing- D  Meal Prep- D  Eating- I  Maintaining continence- D - catheter  Transferring/Ambulation- D  Managing Meds- I  Follow up appointments reviewed:  PCP Hospital f/u appt confirmed? No  PCP to advise due to schedule. [facility] f/u appt confirmed? No   Are transportation arrangements needed? No  - pt to schedule if needed If their condition worsens, is the pt aware to call PCP or go to the Emergency Dept.? Yes Was the patient provided with contact information for the PCP's office or ED? Yes Was to pt encouraged to call back with questions or concerns? Yes

## 2021-07-16 ENCOUNTER — Telehealth: Payer: Self-pay

## 2021-07-16 NOTE — Telephone Encounter (Signed)
Transition Care Management Follow-up Telephone Call Date of discharge and from where: [facility] 07-14-21 Dx Hypoxic respiratory failure secondary to COVID How have you been since you were released from the hospital? Still weak  Any questions or concerns? No  Items Reviewed: Did the pt receive and understand the discharge instructions provided? Yes  Medications obtained and verified? Yes  Other? No  Any new allergies since your discharge? No  Dietary orders reviewed? Yes Do you have support at home? Yes   Home Care and Equipment/Supplies: Were home health services ordered? no If so, what is the name of the agency? na  Has the agency set up a time to come to the patient's home? not applicable Were any new equipment or medical supplies ordered?  No What is the name of the medical supply agency? na Were you able to get the supplies/equipment? not applicable Do you have any questions related to the use of the equipment or supplies? No  Functional Questionnaire: (I = Independent and D = Dependent) ADLs: D  Bathing/Dressing- D  Meal Prep- D  Eating- I  Maintaining continence- D  Transferring/Ambulation- D  Managing Meds- D  Follow up appointments reviewed:  PCP Hospital f/u appt confirmed? Yes  Scheduled to see Dr Ky Barban on 07-22-21 @ [facility] f/u appt confirmed? No . Are transportation arrangements needed? No  If their condition worsens, is the pt aware to call PCP or go to the Emergency Dept.? Yes Was the patient provided with contact information for the PCP's office or ED? Yes Was to pt encouraged to call back with questions or concerns? Yes

## 2021-07-16 NOTE — Telephone Encounter (Signed)
Transition Care Management Unsuccessful Follow-up Telephone Call  Date of discharge and from where:  TCM [facility] 07-14-21 Dx: hypoxic respiratory failure secondary to COVID  Attempts:  1st Attempt  Reason for unsuccessful TCM follow-up call:  Unable to leave message- mailbox full

## 2021-07-21 ENCOUNTER — Ambulatory Visit (INDEPENDENT_AMBULATORY_CARE_PROVIDER_SITE_OTHER): Payer: PPO | Admitting: Family Medicine

## 2021-07-21 ENCOUNTER — Encounter: Payer: Self-pay | Admitting: Family Medicine

## 2021-07-21 VITALS — BP 95/58

## 2021-07-21 DIAGNOSIS — J9601 Acute respiratory failure with hypoxia: Secondary | ICD-10-CM

## 2021-07-21 DIAGNOSIS — U071 COVID-19: Secondary | ICD-10-CM

## 2021-07-21 DIAGNOSIS — I10 Essential (primary) hypertension: Secondary | ICD-10-CM | POA: Diagnosis not present

## 2021-07-21 DIAGNOSIS — G20 Parkinson's disease: Secondary | ICD-10-CM | POA: Diagnosis not present

## 2021-07-21 NOTE — Assessment & Plan Note (Signed)
Now back on home antihypertensives with good control though today's reading low in SBP 90s. Hold tonight's dose of enalapril, resume as normal tomorrow. Continue to monitor BP at home and call for consistent measurements <100 SBP.

## 2021-07-21 NOTE — Assessment & Plan Note (Signed)
Improved s/p remdesivir.

## 2021-07-21 NOTE — Progress Notes (Signed)
Virtual Visit via Telephone Note  I connected with Roger Rodriguez on 07/21/21 at  3:40 PM EST by telephone and verified that I am speaking with the correct person using two identifiers.  Location: Patient: home Provider: [facility] Son, Roger Rodriguez also on call.   I discussed the limitations, risks, security and privacy concerns of performing an evaluation and management service by telephone and the availability of in person appointments. I also discussed with the patient that there may be a patient responsible charge related to this service. The patient expressed understanding and agreed to proceed.   History of Present Illness:  HOSPITAL FOLLOW UP Hospital/facility: [facility] 12/28-1/2 Diagnosis: Acute resp failure with hypoxia 2/2 COVID-19 Procedures/tests:  Consultants:  New medications:  Discharge instructions:   Status: better - still weak.  - breathing ok, SpO2 97% - blood pressure slightly low today but otherwise wnl 120s SBP.  - s/p antibiotics for UTI   Observations/Objective:  Patient had trouble connecting to video visit, entirety of visit conducted over the phone.  Garbled speech. No respiratory distress.  Much of the discussion had with son, [name].  Assessment and Plan:  Problem List Items Addressed This Visit       Cardiovascular and Mediastinum   BP (high blood pressure)    Now back on home antihypertensives with good control though today's reading low in SBP 90s. Hold tonight's dose of enalapril, resume as normal tomorrow. Continue to monitor BP at home and call for consistent measurements <100 SBP.         Respiratory   Acute hypoxemic respiratory failure due to COVID-19 [facility])    Improved s/p remdesivir.         Nervous and Auditory   Idiopathic Parkinson's disease ([place]) - Primary    Gait and strength worsened by recent COVID. At baseline is dependent for ADLs. Referral placed for HHPT today.      Relevant Orders   Ambulatory referral to Home Health       I discussed the assessment and treatment plan with the patient. The patient was provided an opportunity to ask questions and all were answered. The patient agreed with the plan and demonstrated an understanding of the instructions.   The patient was advised to call back or seek an in-person evaluation if the symptoms worsen or if the condition fails to improve as anticipated.  I provided 13 minutes of non-face-to-face time during this encounter.   Myles Gip, DO

## 2021-07-21 NOTE — Assessment & Plan Note (Signed)
Gait and strength worsened by recent COVID. At baseline is dependent for ADLs. Referral placed for HHPT today.

## 2021-07-22 ENCOUNTER — Telehealth: Payer: PPO | Admitting: Family Medicine

## 2021-07-25 ENCOUNTER — Telehealth: Payer: Self-pay

## 2021-07-25 NOTE — Telephone Encounter (Signed)
Copied from [place] [id number]. Topic: General - Inquiry >> Jul 25, 2021 10:35 AM Oneta Rack wrote: Caller name: Sina  Relation to pt: PT from [facility]  Call back number: [phone]   Reason for call:  Caller wanted to inform PCP, patient would like to [facility] his PT until next week

## 2021-07-28 ENCOUNTER — Telehealth: Payer: Self-pay | Admitting: Family Medicine

## 2021-07-28 DIAGNOSIS — F321 Major depressive disorder, single episode, moderate: Secondary | ICD-10-CM | POA: Diagnosis not present

## 2021-07-28 DIAGNOSIS — E119 Type 2 diabetes mellitus without complications: Secondary | ICD-10-CM | POA: Diagnosis not present

## 2021-07-28 DIAGNOSIS — K760 Fatty (change of) liver, not elsewhere classified: Secondary | ICD-10-CM | POA: Diagnosis not present

## 2021-07-28 DIAGNOSIS — E7800 Pure hypercholesterolemia, unspecified: Secondary | ICD-10-CM | POA: Diagnosis not present

## 2021-07-28 DIAGNOSIS — Z87440 Personal history of urinary (tract) infections: Secondary | ICD-10-CM | POA: Diagnosis not present

## 2021-07-28 DIAGNOSIS — I10 Essential (primary) hypertension: Secondary | ICD-10-CM | POA: Diagnosis not present

## 2021-07-28 DIAGNOSIS — U099 Post covid-19 condition, unspecified: Secondary | ICD-10-CM | POA: Diagnosis not present

## 2021-07-28 DIAGNOSIS — Z9181 History of falling: Secondary | ICD-10-CM | POA: Diagnosis not present

## 2021-07-28 DIAGNOSIS — Z7901 Long term (current) use of anticoagulants: Secondary | ICD-10-CM | POA: Diagnosis not present

## 2021-07-28 DIAGNOSIS — G2581 Restless legs syndrome: Secondary | ICD-10-CM | POA: Diagnosis not present

## 2021-07-28 DIAGNOSIS — M6281 Muscle weakness (generalized): Secondary | ICD-10-CM | POA: Diagnosis not present

## 2021-07-28 DIAGNOSIS — G20 Parkinson's disease: Secondary | ICD-10-CM | POA: Diagnosis not present

## 2021-07-28 NOTE — Progress Notes (Signed)
Suprapubic Cath Change  Patient is present today for a suprapubic catheter change due to urinary retention.  8 ml of water was drained from the balloon, a 16 FR Silicone foley cath was removed from the tract with out difficulty.  Site was cleaned and prepped in a sterile fashion with betadine.  A 16 FR foley cath was replaced into the tract no complications were noted. Urine return was noted, 10 ml of sterile water was inflated into the balloon and a leg bag was attached for drainage.  Patient tolerated well.   Performed by: Zara Council, PA-C  and Crystal S. Albright, CMA  Follow up: One month follow up for SPT exchange

## 2021-07-28 NOTE — Telephone Encounter (Signed)
That's fine

## 2021-07-28 NOTE — Telephone Encounter (Signed)
Home Health Verbal Orders - Caller/Agency:Shamina physical Therapist from [place] Requesting OT/PT Frequency:OT/1x1 and PT/ 2x4 and 1 every 2 weeks X 2

## 2021-07-29 ENCOUNTER — Ambulatory Visit: Payer: PPO | Admitting: Urology

## 2021-07-29 ENCOUNTER — Other Ambulatory Visit: Payer: Self-pay

## 2021-07-29 DIAGNOSIS — Z9359 Other cystostomy status: Secondary | ICD-10-CM | POA: Diagnosis not present

## 2021-07-29 NOTE — Telephone Encounter (Signed)
[facility] called, left VM to return the call to the office for home health orders.

## 2021-07-29 NOTE — Telephone Encounter (Signed)
Tried returning [employer] call. I called the number listed below and the phone call was directed to a voice message system of another person with [facility]. The voice message system stated that they would be out of the office in August. Im not sure if this is the correct number so I called [facility]'s main number [phone] and left a message for someone to callback. OK for [facility] triage to advise.

## 2021-07-30 ENCOUNTER — Telehealth: Payer: Self-pay

## 2021-07-30 ENCOUNTER — Telehealth: Payer: Self-pay | Admitting: Family Medicine

## 2021-07-30 NOTE — Telephone Encounter (Signed)
Roger Rodriguez contacted me back and I requested clarification as to what she is asking for. She states that on 05/28/21 Dr. Rosanna Randy sent order to them for alternating pressure pads for patient, she states that office notes since from his visit did not show that patient has a pressure ulcer or has ever had a pressure ulcer. She stats that this will not be covered for this reason and wants to know if we will put in order for gel overlay? She states that this has to be a written order, I will fill out form and leave in your box to sign if you do approve.  Form has to be faxed back to attn: Roger Rodriguez [phone]. KW

## 2021-07-30 NOTE — Telephone Encounter (Signed)
Contacted Debbie and advised her of Daneil Dan message and will reach out to contact patient in the morning to advise him an office visit would be needed for evaluation or need. KW

## 2021-07-30 NOTE — Telephone Encounter (Signed)
LMTCB I need clarification. I do not see it mentioned in note or previous telephone call about alternating pressure pad and now she is requesting gel overylay. Need clarification as need for and can this be a verbal order or does it have to written ? If so I would also need fax number to fax order to. KW

## 2021-07-30 NOTE — Telephone Encounter (Signed)
I called and spoke with Shamina. Verbal orders given.

## 2021-07-30 NOTE — Telephone Encounter (Signed)
Copied from [place] [phone]. Topic: General - Other >> Jul 30, 2021  2:57 PM Fields, Museum/gallery conservator R wrote: Reason for CRM: Colletta Maryland (OT) from [employer] hh seeking orders for patients OT 1w5

## 2021-07-30 NOTE — Telephone Encounter (Signed)
Debbie from [place] called in requested an alternating pressure pad, but it was denied because she was told he doesn't have any pressure ulcers so now she is requesting a gel overlay

## 2021-07-31 ENCOUNTER — Telehealth: Payer: Self-pay | Admitting: Family Medicine

## 2021-07-31 NOTE — Telephone Encounter (Signed)
Nurse has been advised of approval. Amparo Bristol

## 2021-07-31 NOTE — Telephone Encounter (Signed)
Please review chart and advise. KW 

## 2021-07-31 NOTE — Telephone Encounter (Signed)
Home Health Verbal Orders - Caller/Agency: [place] Number: [phone]  Requesting OT/PT/Skilled Nursing/Social Work/Speech Therapy: OT  Frequency: 1w5

## 2021-08-01 DIAGNOSIS — K760 Fatty (change of) liver, not elsewhere classified: Secondary | ICD-10-CM | POA: Diagnosis not present

## 2021-08-01 DIAGNOSIS — E7800 Pure hypercholesterolemia, unspecified: Secondary | ICD-10-CM | POA: Diagnosis not present

## 2021-08-01 DIAGNOSIS — F321 Major depressive disorder, single episode, moderate: Secondary | ICD-10-CM | POA: Diagnosis not present

## 2021-08-01 DIAGNOSIS — I10 Essential (primary) hypertension: Secondary | ICD-10-CM | POA: Diagnosis not present

## 2021-08-01 DIAGNOSIS — G2581 Restless legs syndrome: Secondary | ICD-10-CM | POA: Diagnosis not present

## 2021-08-01 DIAGNOSIS — G20 Parkinson's disease: Secondary | ICD-10-CM | POA: Diagnosis not present

## 2021-08-01 DIAGNOSIS — U099 Post covid-19 condition, unspecified: Secondary | ICD-10-CM | POA: Diagnosis not present

## 2021-08-01 DIAGNOSIS — E119 Type 2 diabetes mellitus without complications: Secondary | ICD-10-CM | POA: Diagnosis not present

## 2021-08-01 DIAGNOSIS — Z87440 Personal history of urinary (tract) infections: Secondary | ICD-10-CM | POA: Diagnosis not present

## 2021-08-01 DIAGNOSIS — M6281 Muscle weakness (generalized): Secondary | ICD-10-CM | POA: Diagnosis not present

## 2021-08-01 NOTE — Telephone Encounter (Signed)
Verbal order was given. 

## 2021-08-01 NOTE — Telephone Encounter (Signed)
Patient advised and appt made for 08/06/21 telephone visit. KW

## 2021-08-04 DIAGNOSIS — E7800 Pure hypercholesterolemia, unspecified: Secondary | ICD-10-CM | POA: Diagnosis not present

## 2021-08-04 DIAGNOSIS — Z9181 History of falling: Secondary | ICD-10-CM | POA: Diagnosis not present

## 2021-08-04 DIAGNOSIS — M6281 Muscle weakness (generalized): Secondary | ICD-10-CM | POA: Diagnosis not present

## 2021-08-04 DIAGNOSIS — G2581 Restless legs syndrome: Secondary | ICD-10-CM | POA: Diagnosis not present

## 2021-08-04 DIAGNOSIS — F321 Major depressive disorder, single episode, moderate: Secondary | ICD-10-CM | POA: Diagnosis not present

## 2021-08-04 DIAGNOSIS — U099 Post covid-19 condition, unspecified: Secondary | ICD-10-CM | POA: Diagnosis not present

## 2021-08-04 DIAGNOSIS — K760 Fatty (change of) liver, not elsewhere classified: Secondary | ICD-10-CM | POA: Diagnosis not present

## 2021-08-04 DIAGNOSIS — G20 Parkinson's disease: Secondary | ICD-10-CM | POA: Diagnosis not present

## 2021-08-04 DIAGNOSIS — E119 Type 2 diabetes mellitus without complications: Secondary | ICD-10-CM | POA: Diagnosis not present

## 2021-08-04 DIAGNOSIS — Z7901 Long term (current) use of anticoagulants: Secondary | ICD-10-CM | POA: Diagnosis not present

## 2021-08-04 DIAGNOSIS — Z87440 Personal history of urinary (tract) infections: Secondary | ICD-10-CM | POA: Diagnosis not present

## 2021-08-04 DIAGNOSIS — I10 Essential (primary) hypertension: Secondary | ICD-10-CM | POA: Diagnosis not present

## 2021-08-05 ENCOUNTER — Ambulatory Visit: Payer: Self-pay

## 2021-08-05 NOTE — Telephone Encounter (Signed)
°  Chief Complaint: hand pain Symptoms: hand pain, swelling, tenderness to touch and warmth Frequency: 3-4 days Pertinent Negatives:NA Disposition: [] ED /[] Urgent Care (no appt availability in office) / [x] Appointment(In office/virtual)/ []  [place] [facility]/ [] Home Care/ [] Refused Recommended Disposition /[] [place] [facility]/ []  Follow-up with PCP Additional Notes: Helene Kelp, caregiver has been giving pt Ibuprofen and hasn't improved. Pt has telephone visit for tomorrow at 1540 so Helene Kelp going to call back later to give [place] best number to reach tomorrow.    Summary: pt has a gout complication on top of existing condtion advice   Roger Rodriguez calling in states she and [name] are caring for pt and she is calling for pt he has gout in both hands,  swollen and wanting  possible medication/advice. States they are with pt and want a call back to discuss how to help. States has Parkinson's and can not take much med. States don't know if could even get in car as can't easily move/help as hands are hurting so bad.  Want a fu call to home to advise. [phone]      Reason for Disposition  [1] MODERATE pain (e.g., interferes with normal activities) AND [2] present > 3 days  Answer Assessment - Initial Assessment Questions 1. ONSET: "When did the pain start?"     3-4 days 2. LOCATION: "Where is the pain located?"     Both hands, L hand worse 3. PAIN: "How bad is the pain?" (Scale 1-10; or mild, moderate, severe)   - MILD (1-3): doesn't interfere with normal activities   - MODERATE (4-7): interferes with normal activities (e.g., work or school) or awakens from sleep   - SEVERE (8-10): excruciating pain, unable to use hand at all     Mild to moderate 6. AGGRAVATING FACTORS: "What makes the pain worse?" (e.g., using computer)     Touch and movement 7. OTHER SYMPTOMS: "Do you have any other symptoms?" (e.g., neck pain, swelling, rash, numbness, fever)     Swelling, tender to touch,  warmth  Protocols used: Hand and Wrist Pain-A-AH

## 2021-08-06 ENCOUNTER — Other Ambulatory Visit: Payer: PPO

## 2021-08-06 ENCOUNTER — Ambulatory Visit (INDEPENDENT_AMBULATORY_CARE_PROVIDER_SITE_OTHER): Payer: PPO | Admitting: Family Medicine

## 2021-08-06 ENCOUNTER — Other Ambulatory Visit: Payer: Self-pay

## 2021-08-06 ENCOUNTER — Encounter: Payer: Self-pay | Admitting: Family Medicine

## 2021-08-06 DIAGNOSIS — R29898 Other symptoms and signs involving the musculoskeletal system: Secondary | ICD-10-CM

## 2021-08-06 DIAGNOSIS — M79642 Pain in left hand: Secondary | ICD-10-CM | POA: Diagnosis not present

## 2021-08-06 DIAGNOSIS — L8942 Pressure ulcer of contiguous site of back, buttock and hip, stage 2: Secondary | ICD-10-CM | POA: Diagnosis not present

## 2021-08-06 DIAGNOSIS — M79641 Pain in right hand: Secondary | ICD-10-CM | POA: Insufficient documentation

## 2021-08-06 DIAGNOSIS — M7989 Other specified soft tissue disorders: Secondary | ICD-10-CM | POA: Diagnosis not present

## 2021-08-06 DIAGNOSIS — M79643 Pain in unspecified hand: Secondary | ICD-10-CM | POA: Diagnosis not present

## 2021-08-06 DIAGNOSIS — Z515 Encounter for palliative care: Secondary | ICD-10-CM

## 2021-08-06 NOTE — Assessment & Plan Note (Signed)
Recommend gel pad and geo matt pad or similar as well as q1 hr turning in chair and g2 hr turning in bed Continue use of cream to assist with skin protection Denies blistering or breakdown

## 2021-08-06 NOTE — Assessment & Plan Note (Signed)
Unsure of cause; pt reports adequate water intake CTM Recommend NSAIDs and elevation to assist

## 2021-08-06 NOTE — Progress Notes (Signed)
Virtual telephone visit    Virtual Visit via Telephone Note   This visit type was conducted due to national recommendations for restrictions regarding the COVID-19 Pandemic (e.g. social distancing) in an effort to limit this patient's exposure and mitigate transmission in our community. Due to his co-morbid illnesses, this patient is at least at moderate risk for complications without adequate follow up. This format is felt to be most appropriate for this patient at this time. The patient did not have access to video technology or had technical difficulties with video requiring transitioning to audio format only (telephone). Physical exam was limited to content and character of the telephone converstion.    Patient location: home, conversation with pt and sons and caregiver Provider location: [facility]  I discussed the limitations of evaluation and management by telemedicine and the availability of in person appointments. The patient expressed understanding and agreed to proceed.   Visit Date: 08/06/2021  Today's healthcare provider: Gwyneth Sprout, FNP   Chief Complaint  Patient presents with   Consult    Discuss need for gel overlay, previously order was sent to [facility] originally for alternating pressure pads   Hand Pain   Subjective    Hand Pain  There was no injury mechanism (began 1 week ago). The pain is present in the left hand and right hand. The quality of the pain is described as aching (throbbing). The pain is moderate. The pain has been Constant since the incident. Pertinent negatives include no chest pain, muscle weakness, numbness or tingling. He has tried NSAIDs for the symptoms. The treatment provided mild relief.  HPI     Consult    Additional comments: Discuss need for gel overlay, previously order was sent to [facility] originally for alternating pressure pads      Last edited by Minette Headland, CMA on 08/06/2021  2:54 PM.          Medications: Outpatient Medications Prior to Visit  Medication Sig   amantadine (SYMMETREL) 100 MG capsule Take 100 mg by mouth daily as needed.   atropine 1 % ophthalmic solution Place 1 drop under the tongue at bedtime.   benzonatate (TESSALON PERLES) 100 MG capsule Take 1 capsule (100 mg total) by mouth 3 (three) times daily as needed for cough.   budesonide (RHINOCORT AQUA) 32 MCG/ACT nasal spray USE 1 PUFF IN EACH NOSTRIL EVERY DAY AS NEEDED (Patient taking differently: Place 1 spray into both nostrils daily as needed for allergies.)   busPIRone (BUSPAR) 7.5 MG tablet Take 7.5 mg by mouth 2 (two) times daily. To 3 times daily   celecoxib (CELEBREX) 100 MG capsule Take 100 mg by mouth 2 (two) times daily.   cholecalciferol (VITAMIN D) 1000 units tablet Take 1,000 Units by mouth daily.    enalapril (VASOTEC) 2.5 MG tablet Take 1 tablet (2.5 mg total) by mouth 2 (two) times daily.   gabapentin (NEURONTIN) 100 MG capsule Take 200 mg by mouth 3 (three) times daily.   glucose blood (QUINTET AC BLOOD GLUCOSE TEST) test strip Use as instructed   hydrocortisone 2.5 % lotion Apply to the eyebrows, mid face, and beard area QHS on Tuesday, Thursday, and Saturday.   Hypromellose (ARTIFICIAL TEARS OP) Place 1 drop into both eyes 4 (four) times daily as needed (for dry eyes).    ketoconazole (NIZORAL) 2 % cream Apply 1 application topically 2 (two) times daily.   loratadine (CLARITIN) 10 MG tablet Take 10 mg by mouth daily.  metFORMIN (GLUCOPHAGE) 500 MG tablet TAKE 1 TABLET BY MOUTH 2 TIMES DAILY WITH A MEAL.   mometasone (ELOCON) 0.1 % cream Apply to scaly rash area on neck once to twice daily as needed. Avoid the face, groin, and axilla.   Multiple Vitamin (MULTIVITAMIN WITH MINERALS) TABS tablet Take 1 tablet by mouth daily.   polyethylene glycol (MIRALAX / GLYCOLAX) packet Take 17 g by mouth daily as needed.    pramipexole (MIRAPEX) 0.5 MG tablet Take 2 tablets (1 mg total) by mouth at bedtime.    PRESCRIPTION MEDICATION Take 2 tablets by mouth in the morning, at noon, in the evening, and at bedtime.   QUEtiapine (SEROQUEL) 25 MG tablet Take 25 mg by mouth at bedtime.   RYTARY 23.75-95 MG CPCR Take 2 capsules by mouth 2 (two) times daily.   sertraline (ZOLOFT) 25 MG tablet Take 25 mg by mouth daily.   vitamin B-12 (CYANOCOBALAMIN) 1000 MCG tablet Take 1,000 mcg by mouth daily.   vitamin C (ASCORBIC ACID) 500 MG tablet Take 1,000 mg by mouth daily.    vitamin E 180 MG (400 UNITS) capsule Take 400 Units by mouth daily.    zinc sulfate 220 (50 Zn) MG capsule Take 1 capsule (220 mg total) by mouth daily.   No facility-administered medications prior to visit.    Review of Systems  Cardiovascular:  Negative for chest pain.  Neurological:  Negative for tingling and numbness.     Objective    There were no vitals taken for this visit.     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    I discussed the assessment and treatment plan with the patient. The patient was provided an opportunity to ask questions and all were answered. The patient agreed with the plan and demonstrated an understanding of the  instructions.   The patient was advised to call back or seek an in-person evaluation if the symptoms worsen or if the condition fails to improve as anticipated.  I provided 12 minutes of non-face-to-face time during this encounter.  Vonna Kotyk, FNP, have reviewed all documentation for this visit. The documentation on 08/06/21 for the exam, diagnosis, procedures, and orders are all accurate and complete.  Patient seen and examined by Tally Joe,  FNP note scribed by Jennings Books, [place], [place] [phone] (phone) [phone] (fax)  [place]

## 2021-08-06 NOTE — Assessment & Plan Note (Signed)
Improving; had tried 200 mg ibuprofen OTC and tylenol Encourage hot packs 20 mins on/off encourage elevation

## 2021-08-06 NOTE — Assessment & Plan Note (Signed)
Improved with decreased pain and decreased swelling CTM

## 2021-08-07 NOTE — Progress Notes (Signed)
Palliative care RN telephonic encounter completed today. Spoke with patient and also with [place], [facility] PT. Patient's verbalizations difficult to understand at times. Sounds fatigued. Per Colletta Maryland, patient continues to receive PT/OT and is motivated to regain strength. Noted to have increased weakness and more easily fatigued since hospitalization on 12/29-1/2 for acute hypoxemic respiratory failure r/t COVID. Patient has caregivers around the clock. He is wheel chair dependent and is requiring a sit to stand list currently. Suprapubic catheter in place and patent. Skin is intact. Patient's goal is to be able to regain strength to be able to transfer self. Plan is for Palliative NP to follow up with an in home visit. Visit scheduled.

## 2021-08-14 ENCOUNTER — Ambulatory Visit (INDEPENDENT_AMBULATORY_CARE_PROVIDER_SITE_OTHER): Payer: PPO | Admitting: Urology

## 2021-08-14 ENCOUNTER — Other Ambulatory Visit: Payer: Self-pay

## 2021-08-14 ENCOUNTER — Ambulatory Visit (INDEPENDENT_AMBULATORY_CARE_PROVIDER_SITE_OTHER): Payer: PPO

## 2021-08-14 ENCOUNTER — Ambulatory Visit: Payer: PPO | Admitting: Dermatology

## 2021-08-14 DIAGNOSIS — Z0000 Encounter for general adult medical examination without abnormal findings: Secondary | ICD-10-CM | POA: Diagnosis not present

## 2021-08-14 DIAGNOSIS — T83090A Other mechanical complication of cystostomy catheter, initial encounter: Secondary | ICD-10-CM | POA: Diagnosis not present

## 2021-08-14 NOTE — Patient Instructions (Signed)
Roger Rodriguez , Thank you for taking time to come for your Medicare Wellness Visit. I appreciate your ongoing commitment to your health goals. Please review the following plan we discussed and let me know if I can assist you in the future.   Screening recommendations/referrals: Colonoscopy: aged out Recommended yearly ophthalmology/optometry visit for glaucoma screening and checkup Recommended yearly dental visit for hygiene and checkup  Vaccinations: Influenza vaccine: 05/20/21 Pneumococcal vaccine: 10/22/16 Tdap vaccine: 11/30/06 due Shingles vaccine: Zostavax 04/06/13   Covid-19: 07/27/19, 08/27/19, 10/01/19, 11/01/19  Advanced directives: yes  Conditions/risks identified: none  Next appointment: Follow up in one year for your annual wellness visit.   Preventive Care 82 Years and Older, Male Preventive care refers to lifestyle choices and visits with your health care provider that can promote health and wellness. What does preventive care include? A yearly physical exam. This is also called an annual well check. Dental exams once or twice a year. Routine eye exams. Ask your health care provider how often you should have your eyes checked. Personal lifestyle choices, including: Daily care of your teeth and gums. Regular physical activity. Eating a healthy diet. Avoiding tobacco and drug use. Limiting alcohol use. Practicing safe sex. Taking low doses of aspirin every day. Taking vitamin and mineral supplements as recommended by your health care provider. What happens during an annual well check? The services and screenings done by your health care provider during your annual well check will depend on your age, overall health, lifestyle risk factors, and family history of disease. Counseling  Your health care provider may ask you questions about your: Alcohol use. Tobacco use. Drug use. Emotional well-being. Home and relationship well-being. Sexual activity. Eating  habits. History of falls. Memory and ability to understand (cognition). Work and work Statistician. Screening  You may have the following tests or measurements: Height, weight, and BMI. Blood pressure. Lipid and cholesterol levels. These may be checked every 5 years, or more frequently if you are over 82 years old. Skin check. Lung cancer screening. You may have this screening every year starting at age 18 if you have a 30-pack-year history of smoking and currently smoke or have quit within the past 15 years. Fecal occult blood test (FOBT) of the stool. You may have this test every year starting at age 5. Flexible sigmoidoscopy or colonoscopy. You may have a sigmoidoscopy every 5 years or a colonoscopy every 10 years starting at age 82. Prostate cancer screening. Recommendations will vary depending on your family history and other risks. Hepatitis C blood test. Hepatitis B blood test. Sexually transmitted disease (STD) testing. Diabetes screening. This is done by checking your blood sugar (glucose) after you have not eaten for a while (fasting). You may have this done every 1-3 years. Abdominal aortic aneurysm (AAA) screening. You may need this if you are a current or former smoker. Osteoporosis. You may be screened starting at age 46 if you are at high risk. Talk with your health care provider about your test results, treatment options, and if necessary, the need for more tests. Vaccines  Your health care provider may recommend certain vaccines, such as: Influenza vaccine. This is recommended every year. Tetanus, diphtheria, and acellular pertussis (Tdap, Td) vaccine. You may need a Td booster every 10 years. Zoster vaccine. You may need this after age 65. Pneumococcal 13-valent conjugate (PCV13) vaccine. One dose is recommended after age 66. Pneumococcal polysaccharide (PPSV23) vaccine. One dose is recommended after age 6. Talk to your health care provider  about which screenings and  vaccines you need and how often you need them. This information is not intended to replace advice given to you by your health care provider. Make sure you discuss any questions you have with your health care provider. Document Released: 07/26/2015 Document Revised: 03/18/2016 Document Reviewed: 04/30/2015 Elsevier Interactive Patient Education  2017 [place] Prevention in the Home Falls can cause injuries. They can happen to people of all ages. There are many things you can do to make your home safe and to help prevent falls. What can I do on the outside of my home? Regularly fix the edges of walkways and driveways and fix any cracks. Remove anything that might make you trip as you walk through a door, such as a raised step or threshold. Trim any bushes or trees on the path to your home. Use bright outdoor lighting. Clear any walking paths of anything that might make someone trip, such as rocks or tools. Regularly check to see if handrails are loose or broken. Make sure that both sides of any steps have handrails. Any raised decks and porches should have guardrails on the edges. Have any leaves, snow, or ice cleared regularly. Use sand or salt on walking paths during winter. Clean up any spills in your garage right away. This includes oil or grease spills. What can I do in the bathroom? Use night lights. Install grab bars by the toilet and in the tub and shower. Do not use towel bars as grab bars. Use non-skid mats or decals in the tub or shower. If you need to sit down in the shower, use a plastic, non-slip stool. Keep the floor dry. Clean up any water that spills on the floor as soon as it happens. Remove soap buildup in the tub or shower regularly. Attach bath mats securely with double-sided non-slip rug tape. Do not have throw rugs and other things on the floor that can make you trip. What can I do in the bedroom? Use night lights. Make sure that you have a light by your  bed that is easy to reach. Do not use any sheets or blankets that are too big for your bed. They should not hang down onto the floor. Have a firm chair that has side arms. You can use this for support while you get dressed. Do not have throw rugs and other things on the floor that can make you trip. What can I do in the kitchen? Clean up any spills right away. Avoid walking on wet floors. Keep items that you use a lot in easy-to-reach places. If you need to reach something above you, use a strong step stool that has a grab bar. Keep electrical cords out of the way. Do not use floor polish or wax that makes floors slippery. If you must use wax, use non-skid floor wax. Do not have throw rugs and other things on the floor that can make you trip. What can I do with my stairs? Do not leave any items on the stairs. Make sure that there are handrails on both sides of the stairs and use them. Fix handrails that are broken or loose. Make sure that handrails are as long as the stairways. Check any carpeting to make sure that it is firmly attached to the stairs. Fix any carpet that is loose or worn. Avoid having throw rugs at the top or bottom of the stairs. If you do have throw rugs, attach them to the floor  with carpet tape. Make sure that you have a light switch at the top of the stairs and the bottom of the stairs. If you do not have them, ask someone to add them for you. What else can I do to help prevent falls? Wear shoes that: Do not have high heels. Have rubber bottoms. Are comfortable and fit you well. Are closed at the toe. Do not wear sandals. If you use a stepladder: Make sure that it is fully opened. Do not climb a closed stepladder. Make sure that both sides of the stepladder are locked into place. Ask someone to hold it for you, if possible. Clearly mark and make sure that you can see: Any grab bars or handrails. First and last steps. Where the edge of each step is. Use tools that  help you move around (mobility aids) if they are needed. These include: Canes. Walkers. Scooters. Crutches. Turn on the lights when you go into a dark area. Replace any light bulbs as soon as they burn out. Set up your furniture so you have a clear path. Avoid moving your furniture around. If any of your floors are uneven, fix them. If there are any pets around you, be aware of where they are. Review your medicines with your doctor. Some medicines can make you feel dizzy. This can increase your chance of falling. Ask your doctor what other things that you can do to help prevent falls. This information is not intended to replace advice given to you by your health care provider. Make sure you discuss any questions you have with your health care provider. Document Released: 04/25/2009 Document Revised: 12/05/2015 Document Reviewed: 08/03/2014 Elsevier Interactive Patient Education  2017 [employer].

## 2021-08-14 NOTE — Progress Notes (Signed)
Virtual Visit via Telephone Note  I connected with  Roger Rodriguez on 08/14/21 at  3:00 PM EST by telephone and verified that I am speaking with the correct person using two identifiers.  Location: Patient: home  Provider: [facility] Persons participating in the virtual visit: [place]   I discussed the limitations, risks, security and privacy concerns of performing an evaluation and management service by telephone and the availability of in person appointments. The patient expressed understanding and agreed to proceed.  Interactive audio and video telecommunications were attempted between this nurse and patient, however failed, due to patient having technical difficulties OR patient did not have access to video capability.  We continued and completed visit with audio only.  Some vital signs may be absent or patient reported.   Roger David, LPN  Subjective:   Roger Rodriguez is a 81 y.o. male who presents for Medicare Annual/Subsequent preventive examination.  Review of Systems           Objective:    There were no vitals filed for this visit. There is no height or weight on file to calculate BMI.  Advanced Directives 07/09/2021 11/15/2019 10/10/2018 06/08/2018 06/08/2018 06/03/2018 03/17/2018  Does Patient Have a Medical Advance Directive? Yes Yes Yes Yes Yes Yes Yes  Type of Advance Directive Living will;Healthcare Power of [name];Living will [place];Living will;Out of facility DNR (pink MOST or yellow form) [place];Living will [place];Living will - [place]  Does patient want to make changes to medical advance directive? No - Patient declined - No - Patient declined No - Patient declined - - No - Patient declined  Copy of [place] in Chart? No - copy requested No - copy requested - Yes - validated most recent copy scanned in chart (See  row information) - - No - copy requested    Current Medications (verified) Outpatient Encounter Medications as of 08/14/2021  Medication Sig   amantadine (SYMMETREL) 100 MG capsule Take 100 mg by mouth daily as needed.   atropine 1 % ophthalmic solution Place 1 drop under the tongue at bedtime.   benzonatate (TESSALON PERLES) 100 MG capsule Take 1 capsule (100 mg total) by mouth 3 (three) times daily as needed for cough.   budesonide (RHINOCORT AQUA) 32 MCG/ACT nasal spray USE 1 PUFF IN EACH NOSTRIL EVERY DAY AS NEEDED (Patient taking differently: Place 1 spray into both nostrils daily as needed for allergies.)   busPIRone (BUSPAR) 7.5 MG tablet Take 7.5 mg by mouth 2 (two) times daily. To 3 times daily   celecoxib (CELEBREX) 100 MG capsule Take 100 mg by mouth 2 (two) times daily.   cholecalciferol (VITAMIN D) 1000 units tablet Take 1,000 Units by mouth daily.    enalapril (VASOTEC) 2.5 MG tablet Take 1 tablet (2.5 mg total) by mouth 2 (two) times daily.   gabapentin (NEURONTIN) 100 MG capsule Take 200 mg by mouth 3 (three) times daily.   glucose blood (QUINTET AC BLOOD GLUCOSE TEST) test strip Use as instructed   hydrocortisone 2.5 % lotion Apply to the eyebrows, mid face, and beard area QHS on Tuesday, Thursday, and Saturday.   Hypromellose (ARTIFICIAL TEARS OP) Place 1 drop into both eyes 4 (four) times daily as needed (for dry eyes).    ketoconazole (NIZORAL) 2 % cream Apply 1 application topically 2 (two) times daily.   loratadine (CLARITIN) 10 MG tablet Take  10 mg by mouth daily.   metFORMIN (GLUCOPHAGE) 500 MG tablet TAKE 1 TABLET BY MOUTH 2 TIMES DAILY WITH A MEAL.   mometasone (ELOCON) 0.1 % cream Apply to scaly rash area on neck once to twice daily as needed. Avoid the face, groin, and axilla.   Multiple Vitamin (MULTIVITAMIN WITH MINERALS) TABS tablet Take 1 tablet by mouth daily.   polyethylene glycol (MIRALAX / GLYCOLAX) packet Take 17 g by mouth daily as needed.    pramipexole  (MIRAPEX) 0.5 MG tablet Take 2 tablets (1 mg total) by mouth at bedtime.   PRESCRIPTION MEDICATION Take 2 tablets by mouth in the morning, at noon, in the evening, and at bedtime.   QUEtiapine (SEROQUEL) 25 MG tablet Take 25 mg by mouth at bedtime.   RYTARY 23.75-95 MG CPCR Take 2 capsules by mouth 2 (two) times daily.   RYTARY 61.25-245 MG CPCR Take by mouth.   sertraline (ZOLOFT) 25 MG tablet Take 25 mg by mouth daily.   vitamin B-12 (CYANOCOBALAMIN) 1000 MCG tablet Take 1,000 mcg by mouth daily.   vitamin C (ASCORBIC ACID) 500 MG tablet Take 1,000 mg by mouth daily.    vitamin E 180 MG (400 UNITS) capsule Take 400 Units by mouth daily.    zinc sulfate 220 (50 Zn) MG capsule Take 1 capsule (220 mg total) by mouth daily.   No facility-administered encounter medications on file as of 08/14/2021.    Allergies (verified) Sulfa antibiotics   History: Past Medical History:  Diagnosis Date   Allergy    allergic rhinitis   Arthritis    OA   Chronic kidney disease    stones   Depression    Diabetes mellitus without complication (HCC)    Family history of adverse reaction to anesthesia    Hematuria    History of kidney stones    Hyperlipidemia    Hypertension    no meds since parkinsons meds started   Incontinence of urine    Inguinal hernia    Neuromuscular disorder ([place])    parkinsons   Overactive bladder    Parkinson disease ([place])    Parkinson's disease ([place])    Prostate cancer ([place])    Rhinitis, allergic    Shortness of breath dyspnea    doe secondary to parkinsons   Past Surgical History:  Procedure Laterality Date   BACK SURGERY  2014   CYSTOSCOPY WITH STENT PLACEMENT Right 01/13/2016   Procedure: CYSTOSCOPY WITH STENT PLACEMENT;  Surgeon: Hollice Espy, MD;  Location: [facility];  Service: Urology;  Laterality: Right;   CYSTOSCOPY/URETEROSCOPY/HOLMIUM LASER/STENT PLACEMENT Left 11/20/2015   Procedure: CYSTOSCOPY/URETEROSCOPY/HOLMIUM LASER/STENT PLACEMENT;  Surgeon: Hollice Espy, MD;  Location: [facility];  Service: Urology;  Laterality: Left;   EYE SURGERY     FRACTURE SURGERY      ankle    H/O arthroscopy of right knee  2015   H/O radical protatectomy     HERNIA REPAIR     JOINT REPLACEMENT     right knee   LITHOTRIPSY  2010   Rotator Cuff tear  1993   Repaired   TOTAL KNEE REVISION Right 03/17/2018   Procedure: TOTAL KNEE REVISION;  Surgeon: Lovell Sheehan, MD;  Location: [facility];  Service: Orthopedics;  Laterality: Right;   TOTAL KNEE REVISION Right 06/08/2018   Procedure: TOTAL KNEE REVISION;  Surgeon: Lovell Sheehan, MD;  Location: [facility];  Service: Orthopedics;  Laterality: Right;   URETEROSCOPY WITH HOLMIUM LASER LITHOTRIPSY  2002?  URETEROSCOPY WITH HOLMIUM LASER LITHOTRIPSY Right 01/13/2016   Procedure: URETEROSCOPY WITH HOLMIUM LASER LITHOTRIPSY;  Surgeon: Hollice Espy, MD;  Location: [facility];  Service: Urology;  Laterality: Right;   VASECTOMY     History   Family History  Problem Relation Age of Onset   Dementia Mother    Cerebral aneurysm Father    Bladder Cancer Paternal Uncle    Liver cancer Sister    Prostate cancer Neg Hx    Kidney cancer Neg Hx    Social History   Socioeconomic History   Marital status: Widowed    Spouse name: Not on file   Number of children: 2   Years of education: Not on file   Highest education level: Some college, no degree  Occupational History   Occupation: retired  Tobacco Use   Smoking status: Former    Types: Cigars    Quit date: 11/11/1968    Years since quitting: 52.7   Smokeless tobacco: Former    Types: Chew    Quit date: 11/12/2003   Tobacco comments:    Occassionally, also chewed tobacco till 2005. Quit smoking 1973  Vaping Use   Vaping Use: Never used  Substance and Sexual Activity   Alcohol use: No    Alcohol/week: 0.0 standard drinks   Drug use: No   Sexual activity: Not Currently  Other Topics Concern   Not on file  Social History Narrative   Not on file   Social  Determinants of Health   Financial Resource Strain: Not on file  Food Insecurity: Not on file  Transportation Needs: Not on file  Physical Activity: Not on file  Stress: Not on file  Social Connections: Not on file    Tobacco Counseling Counseling given: Not Answered Tobacco comments: Occassionally, also chewed tobacco till 2005. Quit smoking 1973   Clinical Intake:  Pre-visit preparation completed: Yes  Pain : No/denies pain     Nutritional Risks: None Diabetes: No  How often do you need to have someone help you when you read instructions, pamphlets, or other written materials from your doctor or pharmacy?: 1 - Never  Diabetic?no  Interpreter Needed?: No  Information entered by :: Kirke Shaggy, LPN   Activities of Daily Living In your present state of health, do you have any difficulty performing the following activities: 07/10/2021 05/28/2021  Hearing? Y N  Vision? N N  Difficulty concentrating or making decisions? Tempie Donning  Walking or climbing stairs? Y Y  Dressing or bathing? Y Y  Doing errands, shopping? N Y  Some recent data might be hidden    Patient Care Team: [name]., MD as PCP - General (Family Medicine) Hollice Espy, MD as Consulting Physician (Urology) Vladimir Crofts, MD as Consulting Physician (Neurology) Leandrew Koyanagi, MD as Referring Physician (Ophthalmology) Sunday Corn Mervin Kung (Neurology) Ralene Bathe, MD (Dermatology)  Indicate any recent Medical Services you may have received from other than [facility] providers in the past year (date may be approximate).     Assessment:   This is a routine wellness examination for Roger Rodriguez.  Hearing/Vision screen No results found.  Dietary issues and exercise activities discussed:     Goals Addressed   None    Depression Screen PHQ 2/9 Scores 05/28/2021 11/15/2019 09/01/2018 07/25/2018 05/26/2018 02/02/2018 11/18/2017  PHQ - 2 Score 6 1 2 1 2 2 2   PHQ- 9 Score 24 - 5 - 5 12 11      Fall Risk Fall Risk  05/28/2021 11/15/2019 07/25/2018 05/26/2018 02/02/2018  Falls in the past year? 0 0 0 1 Yes  Number falls in past yr: 0 0 0 1 2 or more  Injury with Fall? 0 0 - 1 Yes  Comment - - - - damaged artifical replacement in knee and had to have sx.  Risk Factor Category  - - - - -  Risk for fall due to : Impaired balance/gait;Impaired mobility - History of fall(s);Impaired mobility;Impaired balance/gait Impaired balance/gait;History of fall(s);Impaired mobility -  Follow up Falls evaluation completed - Falls prevention discussed Falls evaluation completed;Education provided Falls prevention discussed    FALL RISK PREVENTION PERTAINING TO THE HOME:  Any stairs in or around the home? No  If so, are there any without handrails? No  Home free of loose throw rugs in walkways, pet beds, electrical cords, etc? Yes  Adequate lighting in your home to reduce risk of falls? Yes   ASSISTIVE DEVICES UTILIZED TO PREVENT FALLS:  Life alert? No  Use of a cane, walker or w/c? Yes  Grab bars in the bathroom? Yes  Shower chair or bench in shower? Yes  Elevated toilet seat or a handicapped toilet? Yes   Cognitive Function:     6CIT Screen 11/15/2019 02/02/2018 10/22/2016  What Year? 0 points 0 points 0 points  What month? 0 points 0 points 0 points  What time? 0 points 0 points 0 points  Count back from 20 0 points 0 points 0 points  Months in reverse 0 points 0 points 0 points  Repeat phrase 0 points 2 points 2 points  Total Score 0 2 2    Immunizations Immunization History  Administered Date(s) Administered   Fluad Quad(high Dose 65+) 04/05/2019   Influenza, High Dose Seasonal PF 04/02/2015, 04/02/2016, 04/15/2017, 06/02/2018, 05/20/2021   Influenza,inj,Quad PF,6+ Mos 04/03/2014   Influenza-Unspecified 05/20/2021   PFIZER Comirnaty(Gray Top)Covid-19 Tri-Sucrose Vaccine 10/01/2019, 11/01/2019   PFIZER(Purple Top)SARS-COV-2 Vaccination 07/27/2019, 08/27/2019   Pfizer Covid-19  Vaccine Bivalent Booster 44yrs & up 05/20/2021   Pneumococcal Conjugate-13 10/22/2016   Pneumococcal Polysaccharide-23 04/06/2013, 04/05/2014   Td 11/30/2006   Zoster, Live 04/06/2013    TDAP status: Due, Education has been provided regarding the importance of this vaccine. Advised may receive this vaccine at local pharmacy or Health Dept. Aware to provide a copy of the vaccination record if obtained from local pharmacy or Health Dept. Verbalized acceptance and understanding.  Flu Vaccine status: Up to date  Pneumococcal vaccine status: Up to date  Covid-19 vaccine status: Completed vaccines  Qualifies for Shingles Vaccine? Yes   Zostavax completed Yes   Shingrix Completed?: No.    Education has been provided regarding the importance of this vaccine. Patient has been advised to call insurance company to determine out of pocket expense if they have not yet received this vaccine. Advised may also receive vaccine at local pharmacy or Health Dept. Verbalized acceptance and understanding.  Screening Tests Health Maintenance  Topic Date Due   Zoster Vaccines- Shingrix (1 of 2) Never done   TETANUS/TDAP  11/29/2016   Pneumonia Vaccine 56+ Years old  Completed   INFLUENZA VACCINE  Completed   COVID-19 Vaccine  Completed   HPV VACCINES  Aged Out    Health Maintenance  Health Maintenance Due  Topic Date Due   Zoster Vaccines- Shingrix (1 of 2) Never done   TETANUS/TDAP  11/29/2016    Colorectal cancer screening: No longer required.   Lung Cancer Screening: (Low Dose CT Chest recommended if  Age 49-80 years, 30 pack-year currently smoking OR have quit w/in 15years.) does not qualify.    Additional Screening:  Hepatitis C Screening: does not qualify; Completed no  Vision Screening: Recommended annual ophthalmology exams for early detection of glaucoma and other disorders of the eye. Is the patient up to date with their annual eye exam?  Yes  Who is the provider or what is the name  of the office in which the patient attends annual eye exams? [facility] If pt is not established with a provider, would they like to be referred to a provider to establish care? No .   Dental Screening: Recommended annual dental exams for proper oral hygiene  Community Resource Referral / Chronic Care Management: CRR required this visit?  No   CCM required this visit?  No      Plan:     I have personally reviewed and noted the following in the patients chart:   Medical and social history Use of alcohol, tobacco or illicit drugs  Current medications and supplements including opioid prescriptions. Patient is not currently taking opioid prescriptions. Functional ability and status Nutritional status Physical activity Advanced directives List of other physicians Hospitalizations, surgeries, and ER visits in previous 12 months Vitals Screenings to include cognitive, depression, and falls Referrals and appointments  In addition, I have reviewed and discussed with patient certain preventive protocols, quality metrics, and best practice recommendations. A written personalized care plan for preventive services as well as general preventive health recommendations were provided to patient.     Roger David, LPN   10/12/7060   Nurse Notes: none

## 2021-08-15 DIAGNOSIS — Z9181 History of falling: Secondary | ICD-10-CM | POA: Diagnosis not present

## 2021-08-15 DIAGNOSIS — I10 Essential (primary) hypertension: Secondary | ICD-10-CM | POA: Diagnosis not present

## 2021-08-15 DIAGNOSIS — K760 Fatty (change of) liver, not elsewhere classified: Secondary | ICD-10-CM | POA: Diagnosis not present

## 2021-08-15 DIAGNOSIS — G2581 Restless legs syndrome: Secondary | ICD-10-CM | POA: Diagnosis not present

## 2021-08-15 DIAGNOSIS — U099 Post covid-19 condition, unspecified: Secondary | ICD-10-CM | POA: Diagnosis not present

## 2021-08-15 DIAGNOSIS — E119 Type 2 diabetes mellitus without complications: Secondary | ICD-10-CM | POA: Diagnosis not present

## 2021-08-15 DIAGNOSIS — E7800 Pure hypercholesterolemia, unspecified: Secondary | ICD-10-CM | POA: Diagnosis not present

## 2021-08-15 DIAGNOSIS — M6281 Muscle weakness (generalized): Secondary | ICD-10-CM | POA: Diagnosis not present

## 2021-08-15 DIAGNOSIS — Z7901 Long term (current) use of anticoagulants: Secondary | ICD-10-CM | POA: Diagnosis not present

## 2021-08-15 DIAGNOSIS — Z87440 Personal history of urinary (tract) infections: Secondary | ICD-10-CM | POA: Diagnosis not present

## 2021-08-15 DIAGNOSIS — F321 Major depressive disorder, single episode, moderate: Secondary | ICD-10-CM | POA: Diagnosis not present

## 2021-08-15 DIAGNOSIS — G20 Parkinson's disease: Secondary | ICD-10-CM | POA: Diagnosis not present

## 2021-08-17 ENCOUNTER — Encounter: Payer: Self-pay | Admitting: Urology

## 2021-08-17 NOTE — Progress Notes (Signed)
08/14/2021 8:44 PM   Roger Rodriguez 1941-02-05 [id number]  Referring provider: [name]., MD [address] [place],  [place] [address]  No chief complaint on file.  Urological history: 1. Epididymo orchitis -last occurrence 2019  2. Prostate cancer -s/p prostatectomy 2006  3. Incontinence -contributing factors of age, Parkinson's disease -managed with a SPT exchanged monthly   4. Nephrolithiasis -RUS 2022-no stones visualized  5. Renal cysts -RUS 2022 - Bilateral simple renal cysts are noted.  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 Follow-up ultrasound in 1 year is recommended to ensure stability.  HPI: Roger Rodriguez is a 81 y.o. male who presents today at the request of his care giver, [name] for SPT not draining.  She states the catheter has not put out urine all day.  Patient denies any modifying or aggravating factors.  Patient denies any gross hematuria, dysuria or suprapubic/flank pain.  Patient denies any fevers, chills, nausea or vomiting.    Leg bag with scant amount of urine present.      PMH: Past Medical History:  Diagnosis Date   Allergy    allergic rhinitis   Arthritis    OA   Chronic kidney disease    stones   Depression    Diabetes mellitus without complication (HCC)    Family history of adverse reaction to anesthesia    Hematuria    History of kidney stones    Hyperlipidemia    Hypertension    no meds since parkinsons meds started   Incontinence of urine    Inguinal hernia    Neuromuscular disorder ([place])    parkinsons   Overactive bladder    Parkinson disease ([place])    Parkinson's disease ([place])    Prostate cancer ([place])    Rhinitis, allergic    Shortness of breath dyspnea    doe secondary to parkinsons    Surgical History: Past Surgical History:  Procedure Laterality Date   BACK SURGERY  2014   CYSTOSCOPY WITH STENT PLACEMENT Right 01/13/2016    Procedure: CYSTOSCOPY WITH STENT PLACEMENT;  Surgeon: Hollice Espy, MD;  Location: [facility];  Service: Urology;  Laterality: Right;   CYSTOSCOPY/URETEROSCOPY/HOLMIUM LASER/STENT PLACEMENT Left 11/20/2015   Procedure: CYSTOSCOPY/URETEROSCOPY/HOLMIUM LASER/STENT PLACEMENT;  Surgeon: Hollice Espy, MD;  Location: [facility];  Service: Urology;  Laterality: Left;   EYE SURGERY     FRACTURE SURGERY      ankle    H/O arthroscopy of right knee  2015   H/O radical protatectomy     HERNIA REPAIR     JOINT REPLACEMENT     right knee   LITHOTRIPSY  2010   Rotator Cuff tear  1993   Repaired   TOTAL KNEE REVISION Right 03/17/2018   Procedure: TOTAL KNEE REVISION;  Surgeon: Lovell Sheehan, MD;  Location: [facility];  Service: Orthopedics;  Laterality: Right;   TOTAL KNEE REVISION Right 06/08/2018   Procedure: TOTAL KNEE REVISION;  Surgeon: Lovell Sheehan, MD;  Location: [facility];  Service: Orthopedics;  Laterality: Right;   URETEROSCOPY WITH HOLMIUM LASER LITHOTRIPSY  2002?   URETEROSCOPY WITH HOLMIUM LASER LITHOTRIPSY Right 01/13/2016   Procedure: URETEROSCOPY WITH HOLMIUM LASER LITHOTRIPSY;  Surgeon: Hollice Espy, MD;  Location: [facility];  Service: Urology;  Laterality: Right;   VASECTOMY     History    Home Medications:  Allergies as of 08/14/2021  Reactions   Sulfa Antibiotics Swelling   Lip swelling        Medication List        Accurate as of August 14, 2021 11:59 PM. If you have any questions, ask your nurse or doctor.          amantadine 100 MG capsule Commonly known as: SYMMETREL Take 100 mg by mouth daily as needed.   ARTIFICIAL TEARS OP Place 1 drop into both eyes 4 (four) times daily as needed (for dry eyes).   atropine 1 % ophthalmic solution Place 1 drop under the tongue at bedtime.   benzonatate 100 MG capsule Commonly known as: Tessalon Perles Take 1 capsule (100 mg total) by mouth 3 (three) times daily as needed for cough.   budesonide 32 MCG/ACT nasal  spray Commonly known as: RHINOCORT AQUA USE 1 PUFF IN EACH NOSTRIL EVERY DAY AS NEEDED What changed: See the new instructions.   busPIRone 7.5 MG tablet Commonly known as: BUSPAR Take 7.5 mg by mouth 2 (two) times daily. To 3 times daily   celecoxib 100 MG capsule Commonly known as: CELEBREX Take 100 mg by mouth 2 (two) times daily.   cholecalciferol 1000 units tablet Commonly known as: VITAMIN D Take 1,000 Units by mouth daily.   enalapril 2.5 MG tablet Commonly known as: VASOTEC Take 1 tablet (2.5 mg total) by mouth 2 (two) times daily.   gabapentin 100 MG capsule Commonly known as: NEURONTIN Take 200 mg by mouth 3 (three) times daily.   glucose blood test strip Commonly known as: Quintet AC Blood Glucose Test Use as instructed   hydrocortisone 2.5 % lotion Apply to the eyebrows, mid face, and beard area QHS on Tuesday, Thursday, and Saturday.   ketoconazole 2 % cream Commonly known as: NIZORAL Apply 1 application topically 2 (two) times daily.   loratadine 10 MG tablet Commonly known as: CLARITIN Take 10 mg by mouth daily.   metFORMIN 500 MG tablet Commonly known as: GLUCOPHAGE TAKE 1 TABLET BY MOUTH 2 TIMES DAILY WITH A MEAL.   mometasone 0.1 % cream Commonly known as: ELOCON Apply to scaly rash area on neck once to twice daily as needed. Avoid the face, groin, and axilla.   multivitamin with minerals Tabs tablet Take 1 tablet by mouth daily.   polyethylene glycol 17 g packet Commonly known as: MIRALAX / GLYCOLAX Take 17 g by mouth daily as needed.   pramipexole 0.5 MG tablet Commonly known as: MIRAPEX Take 2 tablets (1 mg total) by mouth at bedtime.   PRESCRIPTION MEDICATION Take 2 tablets by mouth in the morning, at noon, in the evening, and at bedtime.   QUEtiapine 25 MG tablet Commonly known as: SEROQUEL Take 25 mg by mouth at bedtime.   Rytary 23.75-95 MG Cpcr Generic drug: Carbidopa-Levodopa ER Take 2 capsules by mouth 2 (two) times  daily.   Rytary 61.25-245 MG Cpcr Generic drug: Carbidopa-Levodopa ER Take by mouth.   sertraline 25 MG tablet Commonly known as: ZOLOFT Take 25 mg by mouth daily.   vitamin B-12 1000 MCG tablet Commonly known as: CYANOCOBALAMIN Take 1,000 mcg by mouth daily.   vitamin C 500 MG tablet Commonly known as: ASCORBIC ACID Take 1,000 mg by mouth daily.   vitamin E 180 MG (400 UNITS) capsule Take 400 Units by mouth daily.   zinc sulfate 220 (50 Zn) MG capsule Take 1 capsule (220 mg total) by mouth daily.        Allergies:  Allergies  Allergen Reactions   Sulfa Antibiotics Swelling    Lip swelling    Family History: Family History  Problem Relation Age of Onset   Dementia Mother    Cerebral aneurysm Father    Bladder Cancer Paternal Uncle    Liver cancer Sister    Prostate cancer Neg Hx    Kidney cancer Neg Hx     Social History:  reports that he quit smoking about 52 years ago. His smoking use included cigars. He quit smokeless tobacco use about 17 years ago.  His smokeless tobacco use included chew. He reports that he does not drink alcohol and does not use drugs.  ROS: Pertinent ROS in HPI  Physical Exam: Constitutional:  Well nourished. Alert and oriented, No acute distress. HEENT: [place] AT, mask in place.  Trachea midline Cardiovascular: No clubbing, cyanosis, or edema. Respiratory: Normal respiratory effort, no increased work of breathing. Neurologic: Grossly intact, no focal deficits, moving all 4 extremities. Psychiatric: Normal mood and affect.  Laboratory Data: Lab Results  Component Value Date   WBC 9.3 07/14/2021   HGB 12.5 (L) 07/14/2021   HCT 37.5 (L) 07/14/2021   MCV 94.2 07/14/2021   PLT 180 07/14/2021    Lab Results  Component Value Date   CREATININE 0.84 07/14/2021    Lab Results  Component Value Date   HGBA1C 7.1 (A) 05/28/2021    Lab Results  Component Value Date   TSH 1.970 12/31/2020       Component Value Date/Time    CHOL 225 (H) 12/31/2020 1516   HDL 31 (L) 12/31/2020 1516   CHOLHDL 7.3 (H) 12/31/2020 1516   LDLCALC 156 (H) 12/31/2020 1516    Lab Results  Component Value Date   AST 19 07/14/2021   Lab Results  Component Value Date   ALT 6 07/14/2021  I have reviewed the labs.   Pertinent Imaging: N/A  Suprapubic Cath Change  Patient is present today for a suprapubic catheter change due to urinary retention.  9 ml of water was drained from the balloon, a 16 FR foley cath was removed from the tract with out difficulty.  Site was cleaned and prepped in a sterile fashion with betadine.  A 16 FR Silastic foley cath was replaced into the tract no complications were noted. Urine return was noted 300 cc of cloudy urine.  SPT was irrigated with 500 cc sterile water until free of sediment, 10 ml of sterile water was inflated into the balloon and a leg bag and extender was attached for drainage.  Patient tolerated well.    Performed by: Zara Council, PA-C and Rebeca Linward Natal, CMA  Assessment & Plan:    1. Clogged SPT -as patient's catheter was not draining I decided to exchange the catheter -catheter was found to be clogged with sediment -Silastic catheter was placed in hopes of preventing further clogging episodes  Return for as scheduld for SPT exchange will also need RUS scheduled .  These notes generated with voice recognition software. I apologize for typographical errors.  Zara Council, PA-C  [facility] [address]  [place], [place] [address] [phone]

## 2021-08-18 ENCOUNTER — Other Ambulatory Visit: Payer: Self-pay

## 2021-08-18 ENCOUNTER — Other Ambulatory Visit: Payer: PPO | Admitting: Student

## 2021-08-18 DIAGNOSIS — G20A1 Parkinson's disease without dyskinesia, without mention of fluctuations: Secondary | ICD-10-CM

## 2021-08-18 DIAGNOSIS — E119 Type 2 diabetes mellitus without complications: Secondary | ICD-10-CM

## 2021-08-18 DIAGNOSIS — Z515 Encounter for palliative care: Secondary | ICD-10-CM

## 2021-08-18 DIAGNOSIS — Z9359 Other cystostomy status: Secondary | ICD-10-CM

## 2021-08-18 DIAGNOSIS — R600 Localized edema: Secondary | ICD-10-CM

## 2021-08-18 DIAGNOSIS — G20 Parkinson's disease: Secondary | ICD-10-CM

## 2021-08-18 DIAGNOSIS — R531 Weakness: Secondary | ICD-10-CM

## 2021-08-18 NOTE — Progress Notes (Signed)
[facility] Consult Note Telephone: [phone]  Fax: [phone]    Date of encounter: 08/18/21 12:39 PM PATIENT NAME: Roger Rodriguez [place] [id number]   [phone] (home)  DOB: [birth year] MRN: [id number] PRIMARY CARE PROVIDER:    [name]., MD,  [address] [place] [address] [phone]  REFERRING PROVIDER:   [name]., MD [address] [place],  [place] [address] [phone]  RESPONSIBLE PARTY:    Contact Information     Name Relation Home Work [place] Son [phone]     [place], [place] [phone]          I met face to face with patient and caregiver in the home. Palliative Care was asked to follow this patient by consultation request of  [name].,* to address advance care planning and complex medical decision making. This is a follow up visit.                                   ASSESSMENT AND PLAN / RECOMMENDATIONS:   Advance Care Planning/Goals of Care: Goals include to maximize quality of life and symptom management. Patient/health care surrogate gave his/her permission to discuss. Our advance care planning conversation included a discussion about:    The value and importance of advance care planning  Experiences with loved ones who have been seriously ill or have died  Exploration of personal, cultural or spiritual beliefs that might influence medical decisions  CODE STATUS: DNR  Symptom Management/Plan:  Parkinson's Disease-Continue Rytary as directed. No worsening of freezing episodes. He is currently receiving PT/OT with [facility] home [facility]. Follow up with Neurologist as scheduled. Patient did receive mechanical lift, but it is not motorized. Son will be sending back and plan on getting lift replaced.  Generalized weakness-currently receiving PT/OT. Recommend gait belt for  transfers. Monitor for falls and safety.   Suprapubic catheter-catheter changed out last week per Urology due to blockage. Denies any concerns with catheter. Follow up with Urology as scheduled.  T2DM-patient's blood sugars have been around 17m /dL. Continue metformin Bid w/meals. Hemoglobin A1c 7.1 05/2021.  Left upper extremity edema-patient reports pain and mild swelling to left wrist/arm in the past week. No recent injury reported. He is encouraged to elevate arm. Take naproxen BID x 3 days, then resume taking PRN.   Follow up Palliative Care Visit: Palliative care will continue to follow for complex medical decision making, advance care planning, and clarification of goals. Return in 8 weeks or prn.  This visit was coded based on medical decision making (MDM).  PPS: 30%  HOSPICE ELIGIBILITY/DIAGNOSIS: TBD  Chief Complaint: Palliative Medicine follow up visit.   HISTORY OF PRESENT ILLNESS:  Roger BECKERMANis a 81y.o. year old male  with Parkinson's disease, T2DM, OA, CKD, hypertension, depression.   Patient resides at home; has 24/7 caregivers. He was in the hospital in January 2023 due to Covid. He reports being at his baseline except for still being tired. He endorses a good appetite; no worsening swallowing difficulty. He is receiving both OT and PT through [facility]. He has a new lift in the home, but they are not currently using as it is a motorized and they are going to send it back. His caregivers are currently using a gait belt to stand/pivot  for transfers. Patient had his suprapubic catheter changed out last week due to blockage; no current issues. He states he hasn't take blood pressure medications in the past 2 weeks due to his blood pressures being managed and sometimes "too low." Home readings are usually 82-392'C systolic per patient and caregiver. Blood sugars have been around 100 mg/dL; continues on metformin BID w/meals. He has some pain and mild swelling to left wrist  and arm, which started in the past week. He has been taking naproxen PRN. A 10-point review of systems is negative, except for the pertinent positives and negatives detailed in the HPI.   History obtained from review of EMR, discussion with primary team, and interview with family, facility staff/caregiver and/or Mr. Meaders.  I reviewed available labs, medications, imaging, studies and related documents from the EMR.  Records reviewed and summarized above.    Physical Exam:  Pulse 88, resp 20, b/p 118/86, sats 96% on room air Constitutional: NAD General: frail appearing, WNWD EYES: anicteric sclera, lids intact, no discharge  ENMT: intact hearing, oral mucous membranes moist, dentition intact CV: S1S2, RRR, no LE edema Pulmonary: LCTA, no increased work of breathing, no cough, room air Abdomen: normo-active BS + 4 quadrants, soft and non tender, no ascites GU: deferred MSK: moves all extremities Skin: warm and dry, no rashes or wounds on visible skin, mild edema to LUE Neuro: generalized weakness, A & O x 3, speech mumbled, low in tone Psych: non-anxious affect, pleasant Hem/lymph/immuno: no widespread bruising   Thank you for the opportunity to participate in the care of Mr. Corbo.  The palliative care team will continue to follow. Please call our office at [phone] if we can be of additional assistance.   [name], NP   COVID-19 PATIENT SCREENING TOOL Asked and negative response unless otherwise noted:   Have you had symptoms of covid, tested positive or been in contact with someone with symptoms/positive test in the past 5-10 days? No

## 2021-08-21 ENCOUNTER — Ambulatory Visit: Payer: PPO | Admitting: Urology

## 2021-08-21 NOTE — Progress Notes (Deleted)
08/22/2021 8:36 PM   Roger Rodriguez Mar 02, 1941 [id number]  Referring provider: Jerrol Rodriguez., MD [address] [place],  [place] [address]  No chief complaint on file.  Urological history: 1. Epididymo orchitis -last occurrence 2019  2. Prostate cancer -s/p prostatectomy 2006  3. Incontinence -contributing factors of age, Parkinson's disease -managed with a SPT exchanged monthly   4. Nephrolithiasis -RUS 2022-no stones visualized  5. Renal cysts -RUS 2022 - Bilateral simple renal cysts are noted.  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 Follow-up ultrasound in 1 year is recommended to ensure stability.  HPI: Roger Rodriguez is a 81 y.o. male who presents today at the request of his care giver, Roger Rodriguez for SPT not draining.  She called the office yesterday stated that the catheter did not drain all day.  When she was cleaning Roger Rodriguez, he had a large lose bowel movement and the SPT started to drain again.     PMH: Past Medical History:  Diagnosis Date   Allergy    allergic rhinitis   Arthritis    OA   Chronic kidney disease    stones   Depression    Diabetes mellitus without complication (HCC)    Family history of adverse reaction to anesthesia    Hematuria    History of kidney stones    Hyperlipidemia    Hypertension    no meds since parkinsons meds started   Incontinence of urine    Inguinal hernia    Neuromuscular disorder ([place])    parkinsons   Overactive bladder    Parkinson disease ([place])    Parkinson's disease ([place])    Prostate cancer ([place])    Rhinitis, allergic    Shortness of breath dyspnea    doe secondary to parkinsons    Surgical History: Past Surgical History:  Procedure Laterality Date   BACK SURGERY  2014   CYSTOSCOPY WITH STENT PLACEMENT Right 01/13/2016   Procedure: CYSTOSCOPY WITH STENT PLACEMENT;  Surgeon: Roger Espy, MD;  Location: [facility];  Service:  Urology;  Laterality: Right;   CYSTOSCOPY/URETEROSCOPY/HOLMIUM LASER/STENT PLACEMENT Left 11/20/2015   Procedure: CYSTOSCOPY/URETEROSCOPY/HOLMIUM LASER/STENT PLACEMENT;  Surgeon: Roger Espy, MD;  Location: [facility];  Service: Urology;  Laterality: Left;   EYE SURGERY     FRACTURE SURGERY      ankle    H/O arthroscopy of right knee  2015   H/O radical protatectomy     HERNIA REPAIR     JOINT REPLACEMENT     right knee   LITHOTRIPSY  2010   Rotator Cuff tear  1993   Repaired   TOTAL KNEE REVISION Right 03/17/2018   Procedure: TOTAL KNEE REVISION;  Surgeon: Roger Sheehan, MD;  Location: [facility];  Service: Orthopedics;  Laterality: Right;   TOTAL KNEE REVISION Right 06/08/2018   Procedure: TOTAL KNEE REVISION;  Surgeon: Roger Sheehan, MD;  Location: [facility];  Service: Orthopedics;  Laterality: Right;   URETEROSCOPY WITH HOLMIUM LASER LITHOTRIPSY  2002?   URETEROSCOPY WITH HOLMIUM LASER LITHOTRIPSY Right 01/13/2016   Procedure: URETEROSCOPY WITH HOLMIUM LASER LITHOTRIPSY;  Surgeon: Roger Espy, MD;  Location: [facility];  Service: Urology;  Laterality: Right;   VASECTOMY     History    Home Medications:  Allergies as of 08/22/2021       Reactions   Sulfa Antibiotics Swelling   Lip swelling  Medication List        Accurate as of August 21, 2021  8:36 PM. If you have any questions, ask your nurse or doctor.          amantadine 100 MG capsule Commonly known as: SYMMETREL Take 100 mg by mouth daily as needed.   ARTIFICIAL TEARS OP Place 1 drop into both eyes 4 (four) times daily as needed (for dry eyes).   atropine 1 % ophthalmic solution Place 1 drop under the tongue at bedtime.   benzonatate 100 MG capsule Commonly known as: Tessalon Perles Take 1 capsule (100 mg total) by mouth 3 (three) times daily as needed for cough.   budesonide 32 MCG/ACT nasal spray Commonly known as: RHINOCORT AQUA USE 1 PUFF IN EACH NOSTRIL EVERY DAY AS NEEDED What changed:  See the new instructions.   busPIRone 7.5 MG tablet Commonly known as: BUSPAR Take 7.5 mg by mouth 2 (two) times daily. To 3 times daily   celecoxib 100 MG capsule Commonly known as: CELEBREX Take 100 mg by mouth 2 (two) times daily.   cholecalciferol 1000 units tablet Commonly known as: VITAMIN D Take 1,000 Units by mouth daily.   enalapril 2.5 MG tablet Commonly known as: VASOTEC Take 1 tablet (2.5 mg total) by mouth 2 (two) times daily.   fexofenadine 180 MG tablet Commonly known as: ALLEGRA Take 180 mg by mouth daily.   gabapentin 100 MG capsule Commonly known as: NEURONTIN Take 200 mg by mouth 3 (three) times daily.   glucose blood test strip Commonly known as: Quintet AC Blood Glucose Test Use as instructed   hydrocortisone 2.5 % lotion Apply to the eyebrows, mid face, and beard area QHS on Tuesday, Thursday, and Saturday.   ketoconazole 2 % cream Commonly known as: NIZORAL Apply 1 application topically 2 (two) times daily.   loratadine 10 MG tablet Commonly known as: CLARITIN Take 10 mg by mouth daily.   metFORMIN 500 MG tablet Commonly known as: GLUCOPHAGE TAKE 1 TABLET BY MOUTH 2 TIMES DAILY WITH A MEAL.   mometasone 0.1 % cream Commonly known as: ELOCON Apply to scaly rash area on neck once to twice daily as needed. Avoid the face, groin, and axilla.   multivitamin with minerals Tabs tablet Take 1 tablet by mouth daily.   polyethylene glycol 17 g packet Commonly known as: MIRALAX / GLYCOLAX Take 17 g by mouth daily as needed.   pramipexole 0.5 MG tablet Commonly known as: MIRAPEX Take 2 tablets (1 mg total) by mouth at bedtime.   PRESCRIPTION MEDICATION Take 2 tablets by mouth in the morning, at noon, in the evening, and at bedtime.   QUEtiapine 25 MG tablet Commonly known as: SEROQUEL Take 25 mg by mouth at bedtime.   Rytary 23.75-95 MG Cpcr Generic drug: Carbidopa-Levodopa ER Take 2 capsules by mouth 2 (two) times daily.   Rytary  61.25-245 MG Cpcr Generic drug: Carbidopa-Levodopa ER Take by mouth.   sertraline 25 MG tablet Commonly known as: ZOLOFT Take 25 mg by mouth daily.   vitamin B-12 1000 MCG tablet Commonly known as: CYANOCOBALAMIN Take 1,000 mcg by mouth daily.   vitamin C 500 MG tablet Commonly known as: ASCORBIC ACID Take 1,000 mg by mouth daily.   vitamin E 180 MG (400 UNITS) capsule Take 400 Units by mouth daily.   zinc sulfate 220 (50 Zn) MG capsule Take 1 capsule (220 mg total) by mouth daily.        Allergies:  Allergies  Allergen Reactions   Sulfa Antibiotics Swelling    Lip swelling    Family History: Family History  Problem Relation Age of Onset   Dementia Mother    Cerebral aneurysm Father    Bladder Cancer Paternal Uncle    Liver cancer Sister    Prostate cancer Neg Hx    Kidney cancer Neg Hx     Social History:  reports that he quit smoking about 52 years ago. His smoking use included cigars. He quit smokeless tobacco use about 17 years ago.  His smokeless tobacco use included chew. He reports that he does not drink alcohol and does not use drugs.  ROS: Pertinent ROS in HPI  Physical Exam: .vitals Constitutional:  Well nourished. Alert and oriented, No acute distress. HEENT: [place] AT, moist mucus membranes.  Trachea midline Cardiovascular: No clubbing, cyanosis, or edema. Respiratory: Normal respiratory effort, no increased work of breathing. GU: No CVA tenderness.  No bladder fullness or masses.  Patient with circumcised/uncircumcised phallus. ***Foreskin easily retracted***  Urethral meatus is patent.  No penile discharge. No penile lesions or rashes. Scrotum without lesions, cysts, rashes and/or edema.  Testicles are located scrotally bilaterally. No masses are appreciated in the testicles. Left and right epididymis are normal. Rectal: Patient with  normal sphincter tone. Anus and perineum without scarring or rashes. No rectal masses are appreciated. Prostate is  approximately *** grams, *** nodules are appreciated. Seminal vesicles are normal. Neurologic: Grossly intact, no focal deficits, moving all 4 extremities. Psychiatric: Normal mood and affect.   Laboratory Data: N/A  Pertinent Imaging: N/A  Bladder Irrigation  Due to *** patient is present today for a bladder irrigation. Patient was cleaned and prepped in a sterile fashion. *** ml of saline/sterile water was instilled and irrigated into the bladder with a 64ml [place] syringe through the catheter in place.  After irrigation urine flow was noted {dnt complications:20057} catheter is now draining fine.  Catheter was reattached to the *** bag for drainage. Patient tolerated well.   Performed by: *** Additional notes/ Follow up: ***   Assessment & Plan:    1. Clogged SPT -as patient's catheter was not draining I decided to exchange the catheter -catheter was found to be clogged with sediment -Silastic catheter was placed in hopes of preventing further clogging episodes  No follow-ups on file.  These notes generated with voice recognition software. I apologize for typographical errors.  Zara Council, PA-C  [facility] [address]  [place], [place] [address] [phone]

## 2021-08-22 ENCOUNTER — Other Ambulatory Visit: Payer: Self-pay

## 2021-08-22 ENCOUNTER — Ambulatory Visit: Payer: PPO | Admitting: Urology

## 2021-08-22 ENCOUNTER — Inpatient Hospital Stay
Admission: EM | Admit: 2021-08-22 | Discharge: 2021-08-27 | DRG: 698 | Disposition: A | Discharge status: Home Health | Payer: PPO | Attending: Internal Medicine | Admitting: Internal Medicine

## 2021-08-22 ENCOUNTER — Encounter: Payer: Self-pay | Admitting: Internal Medicine

## 2021-08-22 ENCOUNTER — Emergency Department: Payer: PPO

## 2021-08-22 DIAGNOSIS — R609 Edema, unspecified: Secondary | ICD-10-CM

## 2021-08-22 DIAGNOSIS — N1330 Unspecified hydronephrosis: Secondary | ICD-10-CM

## 2021-08-22 DIAGNOSIS — Z743 Need for continuous supervision: Secondary | ICD-10-CM | POA: Diagnosis not present

## 2021-08-22 DIAGNOSIS — A419 Sepsis, unspecified organism: Secondary | ICD-10-CM | POA: Diagnosis not present

## 2021-08-22 DIAGNOSIS — L8990 Pressure ulcer of unspecified site, unspecified stage: Secondary | ICD-10-CM | POA: Insufficient documentation

## 2021-08-22 DIAGNOSIS — T83090A Other mechanical complication of cystostomy catheter, initial encounter: Secondary | ICD-10-CM | POA: Diagnosis present

## 2021-08-22 DIAGNOSIS — Z87891 Personal history of nicotine dependence: Secondary | ICD-10-CM

## 2021-08-22 DIAGNOSIS — R7881 Bacteremia: Secondary | ICD-10-CM | POA: Diagnosis not present

## 2021-08-22 DIAGNOSIS — N390 Urinary tract infection, site not specified: Secondary | ICD-10-CM | POA: Diagnosis present

## 2021-08-22 DIAGNOSIS — Z7401 Bed confinement status: Secondary | ICD-10-CM | POA: Diagnosis not present

## 2021-08-22 DIAGNOSIS — R600 Localized edema: Secondary | ICD-10-CM

## 2021-08-22 DIAGNOSIS — Z8546 Personal history of malignant neoplasm of prostate: Secondary | ICD-10-CM | POA: Diagnosis not present

## 2021-08-22 DIAGNOSIS — A4159 Other Gram-negative sepsis: Secondary | ICD-10-CM | POA: Diagnosis not present

## 2021-08-22 DIAGNOSIS — G20 Parkinson's disease: Secondary | ICD-10-CM | POA: Diagnosis not present

## 2021-08-22 DIAGNOSIS — R5381 Other malaise: Secondary | ICD-10-CM | POA: Diagnosis not present

## 2021-08-22 DIAGNOSIS — D529 Folate deficiency anemia, unspecified: Secondary | ICD-10-CM | POA: Diagnosis not present

## 2021-08-22 DIAGNOSIS — Z66 Do not resuscitate: Secondary | ICD-10-CM | POA: Diagnosis not present

## 2021-08-22 DIAGNOSIS — N179 Acute kidney failure, unspecified: Secondary | ICD-10-CM

## 2021-08-22 DIAGNOSIS — I82611 Acute embolism and thrombosis of superficial veins of right upper extremity: Secondary | ICD-10-CM | POA: Diagnosis not present

## 2021-08-22 DIAGNOSIS — E785 Hyperlipidemia, unspecified: Secondary | ICD-10-CM | POA: Diagnosis present

## 2021-08-22 DIAGNOSIS — B9689 Other specified bacterial agents as the cause of diseases classified elsewhere: Secondary | ICD-10-CM | POA: Diagnosis not present

## 2021-08-22 DIAGNOSIS — L89151 Pressure ulcer of sacral region, stage 1: Secondary | ICD-10-CM | POA: Diagnosis not present

## 2021-08-22 DIAGNOSIS — R9431 Abnormal electrocardiogram [ECG] [EKG]: Secondary | ICD-10-CM | POA: Diagnosis not present

## 2021-08-22 DIAGNOSIS — R6520 Severe sepsis without septic shock: Secondary | ICD-10-CM | POA: Diagnosis not present

## 2021-08-22 DIAGNOSIS — Z20822 Contact with and (suspected) exposure to covid-19: Secondary | ICD-10-CM | POA: Diagnosis present

## 2021-08-22 DIAGNOSIS — E119 Type 2 diabetes mellitus without complications: Secondary | ICD-10-CM | POA: Diagnosis not present

## 2021-08-22 DIAGNOSIS — Z800 Family history of malignant neoplasm of digestive organs: Secondary | ICD-10-CM

## 2021-08-22 DIAGNOSIS — Y846 Urinary catheterization as the cause of abnormal reaction of the patient, or of later complication, without mention of misadventure at the time of the procedure: Secondary | ICD-10-CM | POA: Diagnosis present

## 2021-08-22 DIAGNOSIS — I10 Essential (primary) hypertension: Secondary | ICD-10-CM | POA: Diagnosis not present

## 2021-08-22 DIAGNOSIS — D539 Nutritional anemia, unspecified: Secondary | ICD-10-CM | POA: Diagnosis not present

## 2021-08-22 DIAGNOSIS — R339 Retention of urine, unspecified: Secondary | ICD-10-CM | POA: Diagnosis not present

## 2021-08-22 DIAGNOSIS — Z8052 Family history of malignant neoplasm of bladder: Secondary | ICD-10-CM | POA: Diagnosis not present

## 2021-08-22 DIAGNOSIS — I808 Phlebitis and thrombophlebitis of other sites: Secondary | ICD-10-CM | POA: Diagnosis not present

## 2021-08-22 DIAGNOSIS — T83510A Infection and inflammatory reaction due to cystostomy catheter, initial encounter: Principal | ICD-10-CM | POA: Diagnosis present

## 2021-08-22 DIAGNOSIS — R000 Tachycardia, unspecified: Secondary | ICD-10-CM | POA: Diagnosis not present

## 2021-08-22 DIAGNOSIS — I959 Hypotension, unspecified: Secondary | ICD-10-CM | POA: Diagnosis not present

## 2021-08-22 DIAGNOSIS — B957 Other staphylococcus as the cause of diseases classified elsewhere: Secondary | ICD-10-CM | POA: Diagnosis present

## 2021-08-22 DIAGNOSIS — G9341 Metabolic encephalopathy: Secondary | ICD-10-CM | POA: Diagnosis not present

## 2021-08-22 DIAGNOSIS — J9811 Atelectasis: Secondary | ICD-10-CM | POA: Diagnosis not present

## 2021-08-22 DIAGNOSIS — D509 Iron deficiency anemia, unspecified: Secondary | ICD-10-CM | POA: Diagnosis present

## 2021-08-22 DIAGNOSIS — G20A1 Parkinson's disease without dyskinesia, without mention of fluctuations: Secondary | ICD-10-CM | POA: Diagnosis present

## 2021-08-22 DIAGNOSIS — R531 Weakness: Secondary | ICD-10-CM | POA: Diagnosis not present

## 2021-08-22 DIAGNOSIS — Z881 Allergy status to other antibiotic agents status: Secondary | ICD-10-CM

## 2021-08-22 LAB — RESP PANEL BY RT-PCR (FLU A&B, COVID) ARPGX2
Influenza A by PCR: NEGATIVE
Influenza B by PCR: NEGATIVE
SARS Coronavirus 2 by RT PCR: NEGATIVE

## 2021-08-22 LAB — CBC WITH DIFFERENTIAL/PLATELET
Abs Immature Granulocytes: 0.06 10*3/uL (ref 0.00–0.07)
Basophils Absolute: 0 10*3/uL (ref 0.0–0.1)
Basophils Relative: 0 %
Eosinophils Absolute: 0.1 10*3/uL (ref 0.0–0.5)
Eosinophils Relative: 1 %
HCT: 41.4 % (ref 39.0–52.0)
Hemoglobin: 13 g/dL (ref 13.0–17.0)
Immature Granulocytes: 1 %
Lymphocytes Relative: 9 %
Lymphs Abs: 1.1 10*3/uL (ref 0.7–4.0)
MCH: 31.9 pg (ref 26.0–34.0)
MCHC: 31.4 g/dL (ref 30.0–36.0)
MCV: 101.7 fL — ABNORMAL HIGH (ref 80.0–100.0)
Monocytes Absolute: 1.5 10*3/uL — ABNORMAL HIGH (ref 0.1–1.0)
Monocytes Relative: 12 %
Neutro Abs: 9.6 10*3/uL — ABNORMAL HIGH (ref 1.7–7.7)
Neutrophils Relative %: 77 %
Platelets: 208 10*3/uL (ref 150–400)
RBC: 4.07 MIL/uL — ABNORMAL LOW (ref 4.22–5.81)
RDW: 15.3 % (ref 11.5–15.5)
WBC: 12.4 10*3/uL — ABNORMAL HIGH (ref 4.0–10.5)
nRBC: 0 % (ref 0.0–0.2)

## 2021-08-22 LAB — TROPONIN I (HIGH SENSITIVITY)
Troponin I (High Sensitivity): 18 ng/L — ABNORMAL HIGH (ref <18)
Troponin I (High Sensitivity): 18 ng/L — ABNORMAL HIGH (ref <18)

## 2021-08-22 LAB — URINALYSIS, ROUTINE W REFLEX MICROSCOPIC
Bilirubin Urine: NEGATIVE
Glucose, UA: NEGATIVE mg/dL
Ketones, ur: 5 mg/dL — AB
Nitrite: NEGATIVE
Protein, ur: 100 mg/dL — AB
Specific Gravity, Urine: 1.013 (ref 1.005–1.030)
Squamous Epithelial / HPF: NONE SEEN (ref 0–5)
WBC, UA: >50 WBC/hpf — ABNORMAL HIGH (ref 0–5)
pH: 7 (ref 5.0–8.0)

## 2021-08-22 LAB — COMPREHENSIVE METABOLIC PANEL
ALT: <5 U/L (ref 0–44)
AST: 12 U/L — ABNORMAL LOW (ref 15–41)
Albumin: 3.4 g/dL — ABNORMAL LOW (ref 3.5–5.0)
Alkaline Phosphatase: 61 U/L (ref 38–126)
Anion gap: 14 (ref 5–15)
BUN: 31 mg/dL — ABNORMAL HIGH (ref 8–23)
CO2: 21 mmol/L — ABNORMAL LOW (ref 22–32)
Calcium: 8.4 mg/dL — ABNORMAL LOW (ref 8.9–10.3)
Chloride: 102 mmol/L (ref 98–111)
Creatinine, Ser: 2.25 mg/dL — ABNORMAL HIGH (ref 0.61–1.24)
GFR, Estimated: 29 mL/min — ABNORMAL LOW (ref >60)
Glucose, Bld: 132 mg/dL — ABNORMAL HIGH (ref 70–99)
Potassium: 4.6 mmol/L (ref 3.5–5.1)
Sodium: 137 mmol/L (ref 135–145)
Total Bilirubin: 1.1 mg/dL (ref 0.3–1.2)
Total Protein: 7.3 g/dL (ref 6.5–8.1)

## 2021-08-22 LAB — CBC
HCT: 36.5 % — ABNORMAL LOW (ref 39.0–52.0)
Hemoglobin: 11.4 g/dL — ABNORMAL LOW (ref 13.0–17.0)
MCH: 32.2 pg (ref 26.0–34.0)
MCHC: 31.2 g/dL (ref 30.0–36.0)
MCV: 103.1 fL — ABNORMAL HIGH (ref 80.0–100.0)
Platelets: 171 10*3/uL (ref 150–400)
RBC: 3.54 MIL/uL — ABNORMAL LOW (ref 4.22–5.81)
RDW: 15.3 % (ref 11.5–15.5)
WBC: 9.5 10*3/uL (ref 4.0–10.5)
nRBC: 0 % (ref 0.0–0.2)

## 2021-08-22 LAB — GLUCOSE, CAPILLARY: Glucose-Capillary: 147 mg/dL — ABNORMAL HIGH (ref 70–99)

## 2021-08-22 LAB — PROTIME-INR
INR: 1.1 (ref 0.8–1.2)
Prothrombin Time: 13.7 seconds (ref 11.4–15.2)

## 2021-08-22 LAB — CREATININE, SERUM
Creatinine, Ser: 2.15 mg/dL — ABNORMAL HIGH (ref 0.61–1.24)
GFR, Estimated: 30 mL/min — ABNORMAL LOW (ref >60)

## 2021-08-22 LAB — LACTIC ACID, PLASMA
Lactic Acid, Venous: 2.2 mmol/L (ref 0.5–1.9)
Lactic Acid, Venous: 3.4 mmol/L (ref 0.5–1.9)
Lactic Acid, Venous: 2.9 mmol/L (ref 0.5–1.9)
Lactic Acid, Venous: 2.7 mmol/L (ref 0.5–1.9)

## 2021-08-22 LAB — PROCALCITONIN: Procalcitonin: 29.48 ng/mL

## 2021-08-22 MED ORDER — LACTATED RINGERS IV SOLN: INTRAVENOUS | Status: AC

## 2021-08-22 MED ORDER — LACTATED RINGERS IV BOLUS (SEPSIS)
500.0000 mL | Freq: Once | INTRAVENOUS | Status: AC
  Administered 2021-08-22: 500 mL via INTRAVENOUS

## 2021-08-22 MED ORDER — LACTATED RINGERS IV BOLUS (SEPSIS)
1000.0000 mL | Freq: Once | INTRAVENOUS | Status: AC
  Administered 2021-08-22: 1000 mL via INTRAVENOUS

## 2021-08-22 MED ORDER — SODIUM CHLORIDE 0.9 % IV SOLN: 2.0000 g | Freq: Once | INTRAVENOUS | Status: DC

## 2021-08-22 MED ORDER — ENOXAPARIN SODIUM 40 MG/0.4ML IJ SOSY
40.0000 mg | PREFILLED_SYRINGE | INTRAMUSCULAR | Status: DC
  Administered 2021-08-22 – 2021-08-26 (×5): 40 mg via SUBCUTANEOUS
  Filled 2021-08-22 (×5): qty 0.4

## 2021-08-22 MED ORDER — ACETAMINOPHEN 650 MG RE SUPP: 650.0000 mg | Freq: Four times a day (QID) | RECTAL | Status: DC | PRN

## 2021-08-22 MED ORDER — SODIUM CHLORIDE 0.9 % IV SOLN
1.0000 g | INTRAVENOUS | Status: DC
  Filled 2021-08-22: qty 1

## 2021-08-22 MED ORDER — CARBIDOPA-LEVODOPA ER 23.75-95 MG PO CPCR: 2.0000 | ORAL_CAPSULE | Freq: Two times a day (BID) | ORAL | Status: DC

## 2021-08-22 MED ORDER — LACTATED RINGERS IV BOLUS
1000.0000 mL | Freq: Once | INTRAVENOUS | Status: AC
  Administered 2021-08-22: 1000 mL via INTRAVENOUS

## 2021-08-22 MED ORDER — ACETAMINOPHEN 325 MG PO TABS
650.0000 mg | ORAL_TABLET | Freq: Four times a day (QID) | ORAL | Status: DC | PRN
  Administered 2021-08-27: 650 mg via ORAL
  Filled 2021-08-22 (×3): qty 2

## 2021-08-22 MED ORDER — INSULIN ASPART 100 UNIT/ML IJ SOLN
0.0000 [IU] | Freq: Three times a day (TID) | INTRAMUSCULAR | Status: DC
  Administered 2021-08-23 – 2021-08-26 (×3): 1 [IU] via SUBCUTANEOUS
  Filled 2021-08-22 (×3): qty 1

## 2021-08-22 MED ORDER — SODIUM CHLORIDE 0.9 % IV SOLN
2.0000 g | Freq: Once | INTRAVENOUS | Status: AC
  Administered 2021-08-22: 2 g via INTRAVENOUS
  Filled 2021-08-22: qty 20

## 2021-08-22 NOTE — Sepsis Progress Note (Signed)
Notified provider of need to order another repeat lactic acid as the second was higher than the first. ° °

## 2021-08-22 NOTE — Consult Note (Signed)
CODE SEPSIS - PHARMACY COMMUNICATION  **Broad Spectrum Antibiotics should be administered within 1 hour of Sepsis diagnosis**  Time Code Sepsis Called/Page Received: 1251  Antibiotics Ordered: ceftriaxone  Time of 1st antibiotic administration: 1405  Additional action taken by pharmacy: consulted with physician as antibiotics were not present during code sepsis initially.  If necessary, Name of Provider/Nurse Contacted: Katheran Awe ,PharmD Clinical Pharmacist  08/22/2021  2:07 PM

## 2021-08-22 NOTE — H&P (Signed)
History and Physical    Roger Rodriguez GLO:[id number] DOB: 04/06/1941 DOA: 08/22/2021  PCP: [name]., MD  Patient coming from: Home.  Chief Complaint: Confusion and decreased urine output from the suprapubic catheter.  History obtained from patient's son and patient and ER physician.  HPI: Roger Rodriguez is a 81 y.o. male with known history of Parkinson's disease, chronically bedbound, hypertension, diabetes mellitus, chronic suprapubic catheter was found to have decreased urine output from the urinary catheter and has an appointment with the urologist today.  Per patient's son patient was getting more lethargic since yesterday but today became more lethargic and was brought to the ER.  Did not have any nausea vomiting chest pain productive cough fever chills or diarrhea.  ED Course: In the ER patient was confused hypotensive.  Patient was given fluid bolus for possible sepsis likely source urinary tract infection.  Patient's suprapubic catheter was obstructed which was removed and replaced by ER physician.  Following which pus was draining from the urine.  Cultures were sent and started on empiric antibiotics.  COVID test negative.  Labs also shows acute renal failure.  EKG shows normal sinus rhythm I sensitive troponins flat.  At the time of my exam patient has become more alert and awake.  Review of Systems: As per HPI, rest all negative.   Past Medical History:  Diagnosis Date   Allergy    allergic rhinitis   Arthritis    OA   Chronic kidney disease    stones   Depression    Diabetes mellitus without complication (HCC)    Family history of adverse reaction to anesthesia    Hematuria    History of kidney stones    Hyperlipidemia    Hypertension    no meds since parkinsons meds started   Incontinence of urine    Inguinal hernia    Neuromuscular disorder ([place])    parkinsons   Overactive bladder    Parkinson disease ([place])    Parkinson's disease ([place])    Prostate  cancer ([place])    Rhinitis, allergic    Shortness of breath dyspnea    doe secondary to parkinsons    Past Surgical History:  Procedure Laterality Date   BACK SURGERY  2014   CYSTOSCOPY WITH STENT PLACEMENT Right 01/13/2016   Procedure: CYSTOSCOPY WITH STENT PLACEMENT;  Surgeon: Hollice Espy, MD;  Location: [facility];  Service: Urology;  Laterality: Right;   CYSTOSCOPY/URETEROSCOPY/HOLMIUM LASER/STENT PLACEMENT Left 11/20/2015   Procedure: CYSTOSCOPY/URETEROSCOPY/HOLMIUM LASER/STENT PLACEMENT;  Surgeon: Hollice Espy, MD;  Location: [facility];  Service: Urology;  Laterality: Left;   EYE SURGERY     FRACTURE SURGERY      ankle    H/O arthroscopy of right knee  2015   H/O radical protatectomy     HERNIA REPAIR     JOINT REPLACEMENT     right knee   LITHOTRIPSY  2010   Rotator Cuff tear  1993   Repaired   TOTAL KNEE REVISION Right 03/17/2018   Procedure: TOTAL KNEE REVISION;  Surgeon: Lovell Sheehan, MD;  Location: [facility];  Service: Orthopedics;  Laterality: Right;   TOTAL KNEE REVISION Right 06/08/2018   Procedure: TOTAL KNEE REVISION;  Surgeon: Lovell Sheehan, MD;  Location: [facility];  Service: Orthopedics;  Laterality: Right;   URETEROSCOPY WITH HOLMIUM LASER LITHOTRIPSY  2002?   URETEROSCOPY WITH HOLMIUM LASER LITHOTRIPSY Right 01/13/2016   Procedure: URETEROSCOPY WITH HOLMIUM LASER LITHOTRIPSY;  Surgeon: Hollice Espy, MD;  Location: [facility];  Service: Urology;  Laterality: Right;   VASECTOMY     History     reports that he quit smoking about 52 years ago. His smoking use included cigars. He quit smokeless tobacco use about 17 years ago.  His smokeless tobacco use included chew. He reports that he does not drink alcohol and does not use drugs.  Allergies  Allergen Reactions   Sulfa Antibiotics Swelling    Lip swelling    Family History  Problem Relation Age of Onset   Dementia Mother    Cerebral aneurysm Father    Bladder Cancer Paternal Uncle    Liver cancer Sister     Prostate cancer Neg Hx    Kidney cancer Neg Hx     Prior to Admission medications   Medication Sig Start Date End Date Taking? Authorizing Provider  amantadine (SYMMETREL) 100 MG capsule Take 100 mg by mouth daily as needed. 04/13/21   [provider]  atropine 1 % ophthalmic solution Place 1 drop under the tongue at bedtime.    [provider]  benzonatate (TESSALON PERLES) 100 MG capsule Take 1 capsule (100 mg total) by mouth 3 (three) times daily as needed for cough. 07/14/21   Donne Hazel, MD  budesonide (RHINOCORT AQUA) 32 MCG/ACT nasal spray USE 1 PUFF IN EACH NOSTRIL EVERY DAY AS NEEDED Patient taking differently: Place 1 spray into both nostrils daily as needed for allergies. 02/07/18   [name]., MD  busPIRone (BUSPAR) 7.5 MG tablet Take 7.5 mg by mouth 2 (two) times daily. To 3 times daily Patient not taking: Reported on 08/18/2021 09/07/19   [provider]  celecoxib (CELEBREX) 100 MG capsule Take 100 mg by mouth 2 (two) times daily. 07/28/21   [provider]  cholecalciferol (VITAMIN D) 1000 units tablet Take 1,000 Units by mouth daily.  05/19/12   [provider]  enalapril (VASOTEC) 2.5 MG tablet Take 1 tablet (2.5 mg total) by mouth 2 (two) times daily. Patient not taking: Reported on 08/18/2021 02/19/21   [name]., MD  fexofenadine (ALLEGRA) 180 MG tablet Take 180 mg by mouth daily.    [provider]  gabapentin (NEURONTIN) 100 MG capsule Take 200 mg by mouth 3 (three) times daily. 01/08/20   [provider]  glucose blood (QUINTET AC BLOOD GLUCOSE TEST) test strip Use as instructed 08/15/18   [name]., MD  hydrocortisone 2.5 % lotion Apply to the eyebrows, mid face, and beard area QHS on Tuesday, Thursday, and Saturday. 05/05/21   Ralene Bathe, MD  Hypromellose (ARTIFICIAL TEARS OP) Place 1 drop into both eyes 4 (four) times daily as needed (for dry eyes).     [provider]  ketoconazole (NIZORAL) 2 % cream Apply 1 application topically 2 (two) times daily. 01/30/21   Vaillancourt, Aldona Bar, PA-C  loratadine (CLARITIN) 10 MG tablet Take 10 mg by mouth daily. 05/19/12   [provider]  metFORMIN (GLUCOPHAGE) 500 MG tablet TAKE 1 TABLET BY MOUTH 2 TIMES DAILY WITH A MEAL. 05/23/21   [name]., MD  mometasone (ELOCON) 0.1 % cream Apply to scaly rash area on neck once to twice daily as needed. Avoid the face, groin, and axilla. 05/05/21   Ralene Bathe, MD  Multiple Vitamin (MULTIVITAMIN WITH MINERALS) TABS tablet Take 1 tablet by mouth daily. Patient not taking: Reported on 08/14/2021    [provider]  polyethylene glycol (  MIRALAX / GLYCOLAX) packet Take 17 g by mouth daily as needed.     [provider]  pramipexole (MIRAPEX) 0.5 MG tablet Take 2 tablets (1 mg total) by mouth at bedtime. 10/17/17   Bettey Costa, MD  PRESCRIPTION MEDICATION Take 2 tablets by mouth in the morning, at noon, in the evening, and at bedtime.    [provider]  QUEtiapine (SEROQUEL) 25 MG tablet Take 25 mg by mouth at bedtime. Patient not taking: Reported on 08/14/2021 08/10/20   [provider]  RYTARY 23.75-95 MG CPCR Take 2 capsules by mouth 2 (two) times daily. 06/08/21   [provider]  [place] 61.25-245 MG CPCR Take by mouth. 08/12/21   [provider]  sertraline (ZOLOFT) 25 MG tablet Take 25 mg by mouth daily. Patient not taking: Reported on 08/18/2021 12/03/19   [provider]  vitamin B-12 (CYANOCOBALAMIN) 1000 MCG tablet Take 1,000 mcg by mouth daily.    [provider]  vitamin C (ASCORBIC ACID) 500 MG tablet Take 1,000 mg by mouth daily.     [provider]  vitamin E 180 MG (400 UNITS) capsule Take 400 Units by mouth daily.     [provider]  zinc sulfate 220 (50 Zn) MG capsule Take 1 capsule (220 mg total) by mouth daily. Patient not taking: Reported on  08/14/2021 07/15/21   Donne Hazel, MD    Physical Exam: Constitutional: Moderately built and nourished. Vitals:   08/22/21 1135 08/22/21 1136 08/22/21 1222 08/22/21 1406  BP:   (!) 88/57 (!) 97/59  Pulse:    83  Resp:    19  Temp:    98.9 F (37.2 C)  TempSrc:    Oral  SpO2:    91%  Weight: 99.8 kg 81.6 kg    Height: 6' (1.829 m)      Eyes: Anicteric no pallor. ENMT: No discharge from the ears eyes nose and mouth. Neck: No mass felt.  No neck rigidity. Respiratory: No rhonchi or crepitations. Cardiovascular: S1-S2 heard. Abdomen: Soft nontender bowel sounds present.  Suprapubic catheter seen. Musculoskeletal: No edema.  Patient is wearing compression stockings. Skin: No rash. Neurologic: Alert awake oriented to name place moving all extremities but more weak on the right upper and lower extremity which patient and patient's son states is chronic. Psychiatric: Appears normal.  Normal affect.   Labs on Admission: I have personally reviewed following labs and imaging studies  CBC: Recent Labs  Lab 08/22/21 1304  WBC 12.4*  NEUTROABS 9.6*  HGB 13.0  HCT 41.4  MCV 101.7*  PLT 355   Basic Metabolic Panel: Recent Labs  Lab 08/22/21 1304  NA 137  K 4.6  CL 102  CO2 21*  GLUCOSE 132*  BUN 31*  CREATININE 2.25*  CALCIUM 8.4*   GFR: Estimated Creatinine Clearance: 28.3 mL/min (A) (by C-G formula based on SCr of 2.25 mg/dL (H)). Liver Function Tests: Recent Labs  Lab 08/22/21 1304  AST 12*  ALT <5  ALKPHOS 61  BILITOT 1.1  PROT 7.3  ALBUMIN 3.4*   No results for input(s): LIPASE, AMYLASE in the last 168 hours. No results for input(s): AMMONIA in the last 168 hours. Coagulation Profile: Recent Labs  Lab 08/22/21 1304  INR 1.1   Cardiac Enzymes: No results for input(s): CKTOTAL, CKMB, CKMBINDEX, TROPONINI in the last 168 hours. BNP (last 3 results) No results for input(s): PROBNP in the last 8760 hours. HbA1C: No results for input(s): HGBA1C in  the  last 72 hours. CBG: No results for input(s): GLUCAP in the last 168 hours. Lipid Profile: No results for input(s): CHOL, HDL, LDLCALC, TRIG, CHOLHDL, LDLDIRECT in the last 72 hours. Thyroid Function Tests: No results for input(s): TSH, T4TOTAL, FREET4, T3FREE, THYROIDAB in the last 72 hours. Anemia Panel: No results for input(s): VITAMINB12, FOLATE, FERRITIN, TIBC, IRON, RETICCTPCT in the last 72 hours. Urine analysis:    Component Value Date/Time   COLORURINE YELLOW (A) 08/22/2021 1137   APPEARANCEUR TURBID (A) 08/22/2021 1137   APPEARANCEUR Cloudy (A) 06/25/2021 1547   LABSPEC 1.013 08/22/2021 1137   LABSPEC 1.013 03/21/2014 1433   PHURINE 7.0 08/22/2021 1137   GLUCOSEU NEGATIVE 08/22/2021 1137   GLUCOSEU Negative 03/21/2014 1433   HGBUR SMALL (A) 08/22/2021 1137   BILIRUBINUR NEGATIVE 08/22/2021 1137   BILIRUBINUR Negative 06/25/2021 1547   BILIRUBINUR Negative 03/21/2014 1433   KETONESUR 5 (A) 08/22/2021 1137   PROTEINUR 100 (A) 08/22/2021 1137   UROBILINOGEN 0.2 06/02/2018 1601   NITRITE NEGATIVE 08/22/2021 1137   LEUKOCYTESUR MODERATE (A) 08/22/2021 1137   LEUKOCYTESUR Trace 03/21/2014 1433   Sepsis Labs: @LABRCNTIP (procalcitonin:4,lacticidven:4) ) Recent Results (from the past 240 hour(s))  Resp Panel by RT-PCR (Flu A&B, Covid) Nasopharyngeal Swab     Status: None   Collection Time: 08/22/21  1:04 PM   Specimen: Nasopharyngeal Swab; Nasopharyngeal(NP) swabs in vial transport medium  Result Value Ref Range Status   SARS Coronavirus 2 by RT PCR NEGATIVE NEGATIVE Final    Comment: (NOTE) SARS-CoV-2 target nucleic acids are NOT DETECTED.  The SARS-CoV-2 RNA is generally detectable in upper respiratory specimens during the acute phase of infection. The lowest concentration of SARS-CoV-2 viral copies this assay can detect is 138 copies/mL. A negative result does not preclude SARS-Cov-2 infection and should not be used as the sole basis for treatment or other patient  management decisions. A negative result may occur with  improper specimen collection/handling, submission of specimen other than nasopharyngeal swab, presence of viral mutation(s) within the areas targeted by this assay, and inadequate number of viral copies(<138 copies/mL). A negative result must be combined with clinical observations, patient history, and epidemiological information. The expected result is Negative.  Fact Sheet for Patients:  EntrepreneurPulse.com.au  Fact Sheet for Healthcare Providers:  IncredibleEmployment.be  This test is no t yet approved or cleared by the [place] FDA and  has been authorized for detection and/or diagnosis of SARS-CoV-2 by FDA under an Emergency Use Authorization (EUA). This EUA will remain  in effect (meaning this test can be used) for the duration of the COVID-19 declaration under Section 564(b)(1) of the Act, 21 U.S.C.section 360bbb-3(b)(1), unless the authorization is terminated  or revoked sooner.       Influenza A by PCR NEGATIVE NEGATIVE Final   Influenza B by PCR NEGATIVE NEGATIVE Final    Comment: (NOTE) The Xpert Xpress SARS-CoV-2/FLU/RSV plus assay is intended as an aid in the diagnosis of influenza from Nasopharyngeal swab specimens and should not be used as a sole basis for treatment. Nasal washings and aspirates are unacceptable for Xpert Xpress SARS-CoV-2/FLU/RSV testing.  Fact Sheet for Patients: EntrepreneurPulse.com.au  Fact Sheet for Healthcare Providers: IncredibleEmployment.be  This test is not yet approved or cleared by the [place] FDA and has been authorized for detection and/or diagnosis of SARS-CoV-2 by FDA under an Emergency Use Authorization (EUA). This EUA will remain in effect (meaning this test can be used) for the duration of the COVID-19 declaration under Section 564(b)(1)  of the Act, 21 U.S.C. section 360bbb-3(b)(1),  unless the authorization is terminated or revoked.  Performed at [facility], [address], [place], [place] [address]      Radiological Exams on Admission: DG Chest Main Line [facility] 1 View  Result Date: 08/22/2021 CLINICAL DATA:  Questionable sepsis. Evaluate for abnormality. History of diabetes and hypertension. EXAM: PORTABLE CHEST 1 VIEW COMPARISON:  Radiographs 06/19/2021 and 10/15/2017. FINDINGS: 1307 hours. Persistent low lung volumes with patchy bibasilar pulmonary opacities, probably reflecting atelectasis. There is no confluent airspace opacity, edema, significant pleural effusion or pneumothorax. The heart size and mediastinal contours are stable. No acute osseous findings are identified. 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 Telemetry leads overlie the chest. IMPRESSION: Persistent low lung volumes with probable patchy bibasilar atelectasis, similar to priors. No definite acute findings. Electronically Signed   By: Richardean Sale M.D.   On: 08/22/2021 13:22    EKG: Independently reviewed.  Normal sinus rhythm.  Assessment/Plan Principal Problem:   Sepsis ([place]) Active Problems:   Essential (primary) hypertension   Parkinson's disease ([place])   AKI (acute kidney injury) ([place])   Complicated UTI (urinary tract infection)   Diabetes mellitus type 2 in nonobese ([place])    Sepsis secondary to complicated UTI with catheter associated urinary tract infection presently on cefepime.  Continue with hydration follow lactic acid and blood cultures urine cultures. Acute renal failure likely from obstruction from the urinary catheter which at the ER has been replaced and is draining at this time.  I think patient's creatinine will improve with hydration and the obstruction being relieved.  If still persist to be elevated may consider further imaging.  We will hold patient's enalapril due to acute renal failure and hypotension at  presentation. Acute encephalopathy likely from sepsis which is improving.  Closely monitor. Right-sided weakness noted on exam which patient and patient's son states is chronic. Parkinson's disease on Rytary which I discussed with pharmacy for dosing. Hypertension holding enalapril due to hypotension and acute renal failure.  We will keep patient n.p.o. and IV hydralazine.  Follow blood pressure trends. Diabetes mellitus type 2 we will keep patient on sliding scale coverage. Macrocytosis check anemia panel with next blood draw. Chronically bedbound. Prior history of prostate cancer.  Home medications to be reconciled.  I did discuss with patient's son and confirm the dose of Rytary which I discussed with pharmacy.   Since patient has sepsis with acute renal failure will need close monitoring for any further worsening inpatient status.   DVT prophylaxis: Lovenox. Code Status: DNR confirmed with patient's son. Family Communication: Patient's son. Disposition Plan: Home when stable. Consults called: None. Admission status: Inpatient.   Rise Patience MD [facility] Pager [phone].  If 7PM-7AM, please contact night-coverage [email or web address] Password [facility]  08/22/2021, 5:23 PM

## 2021-08-22 NOTE — ED Notes (Signed)
Notified both techs in the ER to take patient upstairs. One will be available shortly.

## 2021-08-22 NOTE — Sepsis Progress Note (Signed)
Secure chat sent to bedside RN to inquire if repeat lactic acid has been drawn yet.

## 2021-08-22 NOTE — ED Provider Notes (Signed)
[facility] Provider Note    Event Date/Time   First MD Initiated Contact with Patient 08/22/21 1231     (approximate)   History   Chief Complaint Urinary Retention   HPI  Roger Rodriguez is a 81 y.o. male with past medical history of hyperlipidemia, diabetes, Parkinson disease, recurrent joint infections, and suprapubic catheter who presents to the ED complaining of urinary retention.  Majority of history is obtained from patient's home health aide, who states that she initially noted the suprapubic catheter to be blocked yesterday in the middle of the day.  She states that she spoke with his urologist office, and was scheduled to go there this morning for placement of catheter, however patient has become increasingly weak and less responsive since that time.  She states that he has not had any fevers and has not complained of any pain, apparently was eating and drinking normally yesterday.  She states that the suprapubic catheter had similar issues last week and was replaced in the urologist office on February 2.  She does report that patient has had significant amount of sediment in catheter collecting bag.     Physical Exam   Triage Vital Signs: ED Triage Vitals  Enc Vitals Group     BP 08/22/21 1128 (!) 163/145     Pulse Rate 08/22/21 1128 87     Resp 08/22/21 1128 17     Temp 08/22/21 1128 98.9 F (37.2 C)     Temp Source 08/22/21 1128 Oral     SpO2 08/22/21 1128 91 %     Weight 08/22/21 1135 220 lb (99.8 kg)     Height 08/22/21 1135 6' (1.829 m)     Head Circumference --      Peak Flow --      Pain Score 08/22/21 1135 0     Pain Loc --      Pain Edu? --      Excl. in [place]? --     Most recent vital signs: Vitals:   08/22/21 1222 08/22/21 1406  BP: (!) 88/57 (!) 97/59  Pulse:  83  Resp:  19  Temp:  98.9 F (37.2 C)  SpO2:  91%    Constitutional: Somnolent but arouses to voice, speaks very softly. Eyes: Conjunctivae are normal.  Pupils  equal, round, and reactive to light bilaterally. Head: Atraumatic. Nose: No congestion/rhinnorhea. Mouth/Throat: Mucous membranes are dry.  Cardiovascular: Normal rate, regular rhythm. Grossly normal heart sounds.  2+ radial pulses bilaterally. Respiratory: Normal respiratory effort.  No retractions. Lungs CTAB. Gastrointestinal: Soft and nontender. No distention.  Suprapubic catheter in place with site clean, dry, and intact.  Dark cloudy urine noted in collecting bag. Musculoskeletal: No lower extremity tenderness nor edema.  Neurologic:  Normal speech and language.  Global weakness noted with no gross focal neurologic deficits appreciated.    ED Results / Procedures / Treatments   Labs (all labs ordered are listed, but only abnormal results are displayed) Labs Reviewed  URINALYSIS, ROUTINE W REFLEX MICROSCOPIC - Abnormal; Notable for the following components:      Result Value   Color, Urine YELLOW (*)    APPearance TURBID (*)    Hgb urine dipstick SMALL (*)    Ketones, ur 5 (*)    Protein, ur 100 (*)    Leukocytes,Ua MODERATE (*)    WBC, UA >50 (*)    Bacteria, UA MANY (*)    All other components within normal limits  LACTIC ACID, PLASMA - Abnormal; Notable for the following components:   Lactic Acid, Venous 2.2 (*)    All other components within normal limits  COMPREHENSIVE METABOLIC PANEL - Abnormal; Notable for the following components:   CO2 21 (*)    Glucose, Bld 132 (*)    BUN 31 (*)    Creatinine, Ser 2.25 (*)    Calcium 8.4 (*)    Albumin 3.4 (*)    AST 12 (*)    GFR, Estimated 29 (*)    All other components within normal limits  CBC WITH DIFFERENTIAL/PLATELET - Abnormal; Notable for the following components:   WBC 12.4 (*)    RBC 4.07 (*)    MCV 101.7 (*)    Neutro Abs 9.6 (*)    Monocytes Absolute 1.5 (*)    All other components within normal limits  TROPONIN I (HIGH SENSITIVITY) - Abnormal; Notable for the following components:   Troponin I (High  Sensitivity) 18 (*)    All other components within normal limits  RESP PANEL BY RT-PCR (FLU A&B, COVID) ARPGX2  CULTURE, BLOOD (ROUTINE X 2)  CULTURE, BLOOD (ROUTINE X 2)  URINE CULTURE  PROTIME-INR  LACTIC ACID, PLASMA  PROCALCITONIN  CBG MONITORING, ED  TROPONIN I (HIGH SENSITIVITY)     EKG  ED ECG REPORT I, Blake Divine, the attending physician, personally viewed and interpreted this ECG.   Date: 08/22/2021  EKG Time: 11:36  Rate: 88  Rhythm: normal sinus rhythm  Axis: LAD  Intervals:none  ST&T Change: None  RADIOLOGY Chest x-ray reviewed by me with no infiltrate, edema, or effusion.  PROCEDURES:  Critical Care performed: Yes, see critical care procedure note(s)  .Critical Care Performed by: Blake Divine, MD Authorized by: Blake Divine, MD   Critical care provider statement:    Critical care time (minutes):  45   Critical care time was exclusive of:  Separately billable procedures and treating other patients and teaching time   Critical care was necessary to treat or prevent imminent or life-threatening deterioration of the following conditions:  Sepsis   Critical care was time spent personally by me on the following activities:  Development of treatment plan with patient or surrogate, discussions with consultants, evaluation of patient's response to treatment, examination of patient, ordering and review of laboratory studies, ordering and review of radiographic studies, ordering and performing treatments and interventions, pulse oximetry, re-evaluation of patient's condition and review of old charts   I assumed direction of critical care for this patient from another provider in my specialty: no     Care discussed with: admitting provider   .1-3 Lead EKG Interpretation Performed by: Blake Divine, MD Authorized by: Blake Divine, MD     Interpretation: normal     ECG rate:  75-90   ECG rate assessment: normal     Rhythm: sinus rhythm     Ectopy:  none     Conduction: normal   SUPRAPUBIC TUBE PLACEMENT  Date/Time: 08/22/2021 3:23 PM Performed by: Blake Divine, MD Authorized by: Blake Divine, MD   Consent:    Consent obtained:  Verbal   Consent given by:  Patient   Risks, benefits, and alternatives were discussed: yes   Universal protocol:    Patient identity confirmed:  Verbally with patient and arm band Sedation:    Sedation type:  None Anesthesia:    Anesthesia method:  None Procedure details:    Complexity:  Simple   Catheter type:  Foley   Catheter size:  16 Fr   Ultrasound guidance: no     Number of attempts:  1   Urine characteristics:  Cloudy and dark Post-procedure details:    Procedure completion:  Tolerated well, no immediate complications   MEDICATIONS ORDERED IN ED: Medications  lactated ringers bolus 1,000 mL (0 mLs Intravenous Stopped 08/22/21 1350)    And  lactated ringers bolus 1,000 mL (1,000 mLs Intravenous New Bag/Given 08/22/21 1314)    And  lactated ringers bolus 500 mL (0 mLs Intravenous Stopped 08/22/21 1350)  cefTRIAXone (ROCEPHIN) 2 g in sodium chloride 0.9 % 100 mL IVPB (2 g Intravenous New Bag/Given 08/22/21 1405)     IMPRESSION / MDM / [place] / ED COURSE  I reviewed the triage vital signs and the nursing notes.                              81 y.o. male with past medical history of hyperlipidemia, diabetes, Parkinson disease, joint infections, and suprapubic catheter who presents to the ED for malfunctioning suprapubic catheter over the past 24 hours now with increasing weakness and somnolence.  Differential diagnosis includes, but is not limited to, sepsis, UTI, pyelonephritis, pneumonia, dehydration, electrolyte abnormality, uremia, ACS.  Patient is ill-appearing but in no acute distress, vital signs remarkable for borderline hypotension, potentially related to dehydration but given his malfunctioning catheter and cloudy urine there is concern for sepsis.  We will  start patient on IV Rocephin and resuscitate with IV fluids, blood cultures and lactic acid obtained.  We will check bladder scan and attempt to flush catheter, but if this is unsuccessful he would require replacement of suprapubic catheter.  The patient is on the cardiac monitor to evaluate for evidence of arrhythmia and/or significant heart rate changes.  Bladder scan showed only 150 cc of urine however attempts to flush catheter were unsuccessful and catheter appears blocked.  Suprapubic catheter was replaced without difficulty, patient now draining cloudy and almost milky appearing urine.  He was started on Rocephin due to concern for sepsis related to UTI, blood pressure gradually improving following IV fluid administration.  Labs unremarkable for AKI, likely secondary to sepsis and urinary retention.  CBC remarkable for mild leukocytosis and lactic acidosis also mildly elevated, consistent with sepsis.  Case discussed with hospitalist for admission.      FINAL CLINICAL IMPRESSION(S) / ED DIAGNOSES   Final diagnoses:  Sepsis without acute organ dysfunction, due to unspecified organism [facility])  Urinary tract infection associated with cystostomy catheter, initial encounter [facility])  Urinary retention  AKI (acute kidney injury) ([place])     Rx / DC Orders   ED Discharge Orders     None        Note:  This document was prepared using Dragon voice recognition software and may include unintentional dictation errors.   Blake Divine, MD 08/22/21 [phone]

## 2021-08-22 NOTE — Sepsis Progress Note (Signed)
eLink is following this Code Sepsis. °

## 2021-08-22 NOTE — ED Triage Notes (Signed)
Pt here with caregiver after pt's foley catheter stopped draining on yesterday. Pt's foley has small amount of urine in bag. Pt also having weakness as well. Pt denies pain. Pt in NAD in triage.

## 2021-08-22 NOTE — ED Notes (Signed)
Per MD Gean Birchwood patient can have a cardiac diet placed and aspiration precautions

## 2021-08-22 NOTE — ED Notes (Signed)
Patient report given to inpatient nurse Baldo Ash, RN. Patient is A&Ox4 but with garbled speech at times. Nurse has been made aware.

## 2021-08-22 NOTE — ED Notes (Signed)
First Nurse Note: Pt to ED via [facility] from home for generalized weakness. Per EMS pts home health nurse states that pts urinary catheter clogged, pt is urinating around catheter. EMS reports that pts initial BP was 77/40- Pt has 20 G IV and was given 400 cc of NS in route. Pts most recent BP was 104/44. EMS reports pts HR is 90 and CBG is 147. Pts initial SpO2 was 90% on room air. Pt was placed on 2 liters via [place] and his SpO2 increased to 96%. Pt is in NAD at this time.

## 2021-08-22 NOTE — Consult Note (Signed)
Pharmacy Antibiotic Note  Roger Rodriguez is a 81 y.o. male admitted on 08/22/2021 with pneumonia and UTI.  Pharmacy has been consulted for Cefepime dosing.  Plan: Cefepime 1g IV Q24 hours  Will continue to monitor renal function and adjust dose as appropriate.  Height: 6' (182.9 cm) Weight: 81.6 kg (180 lb) IBW/kg (Calculated) : 77.6  Temp (24hrs), Avg:98.9 F (37.2 C), Min:98.9 F (37.2 C), Max:98.9 F (37.2 C)  Recent Labs  Lab 08/22/21 1304 08/22/21 1309 08/22/21 1530  WBC 12.4*  --   --   CREATININE 2.25*  --   --   LATICACIDVEN  --  2.2* 3.4*    Estimated Creatinine Clearance: 28.3 mL/min (A) (by C-G formula based on SCr of 2.25 mg/dL (H)).    Allergies  Allergen Reactions   Sulfa Antibiotics Swelling    Lip swelling    Antimicrobials this admission: Ceftriaxone 2/10 x1 Cefepime 2/10 >>   Microbiology results: 2/10 BCx: pending 2/10 UCx: pending   Thank you for allowing pharmacy to be a part of this patients care.  Lance Coon A Zalika Tieszen 08/22/2021 5:27 PM

## 2021-08-22 NOTE — Plan of Care (Signed)
The patient is admitted from ER to 250. A & O x 4 with forgetfulness. His speech is garbled. The patient is oriented to staff, call bell and other safety measures. Patient ane son verbalized understanding. Will continue to monitor

## 2021-08-23 DIAGNOSIS — E119 Type 2 diabetes mellitus without complications: Secondary | ICD-10-CM

## 2021-08-23 DIAGNOSIS — L8990 Pressure ulcer of unspecified site, unspecified stage: Secondary | ICD-10-CM | POA: Insufficient documentation

## 2021-08-23 DIAGNOSIS — D539 Nutritional anemia, unspecified: Secondary | ICD-10-CM

## 2021-08-23 DIAGNOSIS — D529 Folate deficiency anemia, unspecified: Secondary | ICD-10-CM

## 2021-08-23 DIAGNOSIS — I10 Essential (primary) hypertension: Secondary | ICD-10-CM

## 2021-08-23 LAB — VITAMIN B12: Vitamin B-12: 1066 pg/mL — ABNORMAL HIGH (ref 180–914)

## 2021-08-23 LAB — GLUCOSE, CAPILLARY
Glucose-Capillary: 125 mg/dL — ABNORMAL HIGH (ref 70–99)
Glucose-Capillary: 151 mg/dL — ABNORMAL HIGH (ref 70–99)
Glucose-Capillary: 121 mg/dL — ABNORMAL HIGH (ref 70–99)
Glucose-Capillary: 140 mg/dL — ABNORMAL HIGH (ref 70–99)

## 2021-08-23 LAB — IRON AND TIBC
Iron: 29 ug/dL — ABNORMAL LOW (ref 45–182)
Saturation Ratios: 12 % — ABNORMAL LOW (ref 17.9–39.5)
TIBC: 241 ug/dL — ABNORMAL LOW (ref 250–450)
UIBC: 212 ug/dL

## 2021-08-23 LAB — BLOOD CULTURE ID PANEL (REFLEXED) - BCID2

## 2021-08-23 LAB — CBC WITH DIFFERENTIAL/PLATELET
Abs Immature Granulocytes: 0.05 10*3/uL (ref 0.00–0.07)
Basophils Absolute: 0 10*3/uL (ref 0.0–0.1)
Basophils Relative: 0 %
Eosinophils Absolute: 0.1 10*3/uL (ref 0.0–0.5)
Eosinophils Relative: 1 %
HCT: 36.1 % — ABNORMAL LOW (ref 39.0–52.0)
Hemoglobin: 11.4 g/dL — ABNORMAL LOW (ref 13.0–17.0)
Immature Granulocytes: 1 %
Lymphocytes Relative: 8 %
Lymphs Abs: 0.8 10*3/uL (ref 0.7–4.0)
MCH: 31.4 pg (ref 26.0–34.0)
MCHC: 31.6 g/dL (ref 30.0–36.0)
MCV: 99.4 fL (ref 80.0–100.0)
Monocytes Absolute: 1.2 10*3/uL — ABNORMAL HIGH (ref 0.1–1.0)
Monocytes Relative: 11 %
Neutro Abs: 8.1 10*3/uL — ABNORMAL HIGH (ref 1.7–7.7)
Neutrophils Relative %: 79 %
Platelets: 162 10*3/uL (ref 150–400)
RBC: 3.63 MIL/uL — ABNORMAL LOW (ref 4.22–5.81)
RDW: 15.1 % (ref 11.5–15.5)
WBC: 10.3 10*3/uL (ref 4.0–10.5)
nRBC: 0 % (ref 0.0–0.2)

## 2021-08-23 LAB — COMPREHENSIVE METABOLIC PANEL
ALT: <5 U/L (ref 0–44)
AST: 9 U/L — ABNORMAL LOW (ref 15–41)
Albumin: 3.2 g/dL — ABNORMAL LOW (ref 3.5–5.0)
Alkaline Phosphatase: 56 U/L (ref 38–126)
Anion gap: 6 (ref 5–15)
BUN: 30 mg/dL — ABNORMAL HIGH (ref 8–23)
CO2: 28 mmol/L (ref 22–32)
Calcium: 8.3 mg/dL — ABNORMAL LOW (ref 8.9–10.3)
Chloride: 99 mmol/L (ref 98–111)
Creatinine, Ser: 1.53 mg/dL — ABNORMAL HIGH (ref 0.61–1.24)
GFR, Estimated: 45 mL/min — ABNORMAL LOW (ref >60)
Glucose, Bld: 130 mg/dL — ABNORMAL HIGH (ref 70–99)
Potassium: 4.1 mmol/L (ref 3.5–5.1)
Sodium: 133 mmol/L — ABNORMAL LOW (ref 135–145)
Total Bilirubin: 0.8 mg/dL (ref 0.3–1.2)
Total Protein: 6.6 g/dL (ref 6.5–8.1)

## 2021-08-23 LAB — PROCALCITONIN: Procalcitonin: 11.63 ng/mL

## 2021-08-23 LAB — LACTIC ACID, PLASMA: Lactic Acid, Venous: 1.1 mmol/L (ref 0.5–1.9)

## 2021-08-23 LAB — RETICULOCYTES
Immature Retic Fract: 14 % (ref 2.3–15.9)
RBC.: 3.67 MIL/uL — ABNORMAL LOW (ref 4.22–5.81)
Retic Count, Absolute: 61.7 10*3/uL (ref 19.0–186.0)
Retic Ct Pct: 1.7 % (ref 0.4–3.1)

## 2021-08-23 LAB — FOLATE: Folate: 4.5 ng/mL — ABNORMAL LOW (ref >5.9)

## 2021-08-23 LAB — FERRITIN: Ferritin: 156 ng/mL (ref 24–336)

## 2021-08-23 MED ORDER — CARBIDOPA-LEVODOPA ER 23.75-95 MG PO CPCR
2.0000 | ORAL_CAPSULE | Freq: Two times a day (BID) | ORAL | Status: DC
  Administered 2021-08-23: 2 via ORAL
  Filled 2021-08-23 (×2): qty 2

## 2021-08-23 MED ORDER — CARBIDOPA-LEVODOPA ER 61.25-245 MG PO CPCR
1.0000 | ORAL_CAPSULE | Freq: Four times a day (QID) | ORAL | Status: DC
  Administered 2021-08-23 – 2021-08-27 (×14): 1 via ORAL
  Filled 2021-08-23 (×13): qty 1

## 2021-08-23 MED ORDER — SODIUM CHLORIDE 0.9 % IV SOLN
2.0000 g | Freq: Two times a day (BID) | INTRAVENOUS | Status: DC
  Administered 2021-08-23 (×2): 2 g via INTRAVENOUS
  Filled 2021-08-23 (×4): qty 2

## 2021-08-23 MED ORDER — CARBIDOPA-LEVODOPA ER 23.75-95 MG PO CPCR
1.0000 | ORAL_CAPSULE | Freq: Four times a day (QID) | ORAL | Status: DC
  Administered 2021-08-23 – 2021-08-27 (×14): 1 via ORAL
  Filled 2021-08-23 (×13): qty 1

## 2021-08-23 MED ORDER — VANCOMYCIN HCL 2000 MG/400ML IV SOLN
2000.0000 mg | Freq: Once | INTRAVENOUS | Status: AC
  Administered 2021-08-23: 2000 mg via INTRAVENOUS
  Filled 2021-08-23: qty 400

## 2021-08-23 MED ORDER — CHLORHEXIDINE GLUCONATE CLOTH 2 % EX PADS
6.0000 | MEDICATED_PAD | Freq: Every day | CUTANEOUS | Status: DC
  Administered 2021-08-23 – 2021-08-27 (×5): 6 via TOPICAL

## 2021-08-23 MED ORDER — VANCOMYCIN HCL 1250 MG/250ML IV SOLN
1250.0000 mg | INTRAVENOUS | Status: DC
  Filled 2021-08-23: qty 250

## 2021-08-23 MED ORDER — FOLIC ACID 1 MG PO TABS
1.0000 mg | ORAL_TABLET | Freq: Every day | ORAL | Status: DC
  Administered 2021-08-23 – 2021-08-27 (×5): 1 mg via ORAL
  Filled 2021-08-23 (×5): qty 1

## 2021-08-23 NOTE — Assessment & Plan Note (Signed)
Pressure Injury 08/23/21 Sacrum Stage 1 -  Intact skin with non-blanchable redness of a localized area usually over a bony prominence. (Active)  08/23/21 0755  Location: Sacrum  Location Orientation:   Staging: Stage 1 -  Intact skin with non-blanchable redness of a localized area usually over a bony prominence.  Wound Description (Comments):   Present on Admission: Yes

## 2021-08-23 NOTE — Assessment & Plan Note (Signed)
Most likely secondary to complicated UTI and clogged suprapubic catheter. Renal function seems improving, creatinine at 1.53 with baseline less than 1 -Monitor renal function -Continue with gentle hydration -Avoid nephrotoxins

## 2021-08-23 NOTE — Consult Note (Signed)
Pharmacy Antibiotic Note  Roger Rodriguez is a 81 y.o. male admitted on 08/22/2021 with pneumonia and UTI.  Pharmacy has been consulted for Cefepime dosing.  Plan: Will adjust Cefepime 2g IV Q12 hours  Will continue to monitor renal function and adjust dose as appropriate.  Height: 6' (182.9 cm) Weight: 93.4 kg (205 lb 14.6 oz) IBW/kg (Calculated) : 77.6  Temp (24hrs), Avg:98.3 F (36.8 C), Min:97.8 F (36.6 C), Max:98.9 F (37.2 C)  Recent Labs  Lab 08/22/21 1304 08/22/21 1309 08/22/21 1530 08/22/21 1757 08/22/21 1850 08/22/21 2052 08/23/21 0518  WBC 12.4*  --   --  9.5  --   --  10.3  CREATININE 2.25*  --   --  2.15*  --   --  1.53*  LATICACIDVEN  --  2.2* 3.4*  --  2.9* 2.7*  --      Estimated Creatinine Clearance: 44.9 mL/min (A) (by C-G formula based on SCr of 1.53 mg/dL (H)).    Allergies  Allergen Reactions   Sulfa Antibiotics Swelling    Lip swelling    Antimicrobials this admission: Ceftriaxone 2/10 x1 Cefepime 2/10 >>   Microbiology results: 2/10 BCx: pending 2/10 UCx: pending   Thank you for allowing pharmacy to be a part of this patients care.  Ashtan Laton A Jalexis Breed 08/23/2021 7:28 AM

## 2021-08-23 NOTE — Progress Notes (Signed)
Progress Note   Patient: Roger Rodriguez:[id number] DOB: 10-22-1940 DOA: 08/22/2021     1 DOS: the patient was seen and examined on 08/23/2021   Brief hospital course: Taken from H&P.  Roger Rodriguez is a 81 y.o. male with known history of Parkinson's disease, chronically bedbound, hypertension, diabetes mellitus, chronic suprapubic catheter was found to have decreased urine output from the urinary catheter and has an appointment with the urologist today.  Per patient's son patient was getting more lethargic since yesterday but today became more lethargic and was brought to the ER.  On arrival he was confused and hypotensive.  Received fluid bolus for concern of sepsis secondary to UTI.  Suprapubic catheter was clogged which was replaced by ER physician.  Frank pus was draining.  Blood and urine cultures were sent and he was started on cefepime.  Blood cultures came back positive for Staphylococcus species-pending identification. Vancomycin was added until more data is available.   Assessment and Plan: * Sepsis ([place])- (present on admission) Patient met sepsis criteria with leukocytosis, hypothermia and tachycardia.  Most likely secondary to UTI.  Preliminary blood cultures with Staphylococcus species in both bottles. -Continue cefepime -Add vancomycin till more culture data is available  Complicated UTI (urinary tract infection)- (present on admission) Patient is bedbound at baseline with a suprapubic catheter. Frank pus was draining-pending urine cultures. -Continue cefepime and vancomycin for now  AKI (acute kidney injury) ([place]) Most likely secondary to complicated UTI and clogged suprapubic catheter. Renal function seems improving, creatinine at 1.53 with baseline less than 1 -Monitor renal function -Continue with gentle hydration -Avoid nephrotoxins  Essential (primary) hypertension- (present on admission) Blood pressure now within normal limit.  Home dose of enalapril is  being held due to AKI and initial softer blood pressure. -Continue holding enalapril until renal function stabilizes. -Continue with hydralazine as needed  Parkinson's disease ([place])- (present on admission) - Continue with carbidopa-levodopa and pramipexole  Diabetes mellitus type 2 in nonobese ([place]) - SSI  Pressure injury of skin Pressure Injury 08/23/21 Sacrum Stage 1 -  Intact skin with non-blanchable redness of a localized area usually over a bony prominence. (Active)  08/23/21 0755  Location: Sacrum  Location Orientation:   Staging: Stage 1 -  Intact skin with non-blanchable redness of a localized area usually over a bony prominence.  Wound Description (Comments):   Present on Admission: Yes       Macrocytic anemia Anemia panel with folic acid deficiency, mild iron deficiency. -Start her on folic acid supplement   Subjective: Patient was little alert and following commands when seen today.  I was just able to understand few words, mostly mumbling.  Stating that he was feeling weak.  Physical Exam: Vitals:   08/23/21 0509 08/23/21 0742 08/23/21 1208 08/23/21 1617  BP: 122/66 126/70 119/64 (!) 146/81  Pulse: 90 91 88 91  Resp: _0 Temp: 97.8 F (36.6 C) 97.7 F (36.5 C) 97.8 F (36.6 C) 97.9 F (36.6 C)  TempSrc:      SpO2: 99% 95% 96% 94%  Weight:      Height:       General.  Lethargic elderly man, in no acute distress. Pulmonary.  Lungs clear bilaterally, normal respiratory effort. CV.  Regular rate and rhythm, no JVD, rub or murmur. Abdomen.  Soft, nontender, nondistended, BS positive. CNS.  Alert, No focal neurologic deficit. Extremities.  No edema, no cyanosis, pulses intact and symmetrical. Psychiatry.  Judgment and insight appears  impaired.  Data Reviewed: I reviewed prior notes, labs and imaging.  Family Communication: Son was notified  Disposition: Status is: Inpatient Remains inpatient appropriate because: Severity of illness    Planned Discharge Destination: Home with Home Health  DVT prophylaxis-Lovenox  Time spent: 45 minutes  This record has been created using Systems analyst. Errors have been sought and corrected,but may not always be located. Such creation errors do not reflect on the standard of care.  Author: Lorella Nimrod, MD 08/23/2021 4:29 PM  For on call review [email or web address].

## 2021-08-23 NOTE — Assessment & Plan Note (Signed)
SSI

## 2021-08-23 NOTE — Assessment & Plan Note (Signed)
Patient is bedbound at baseline with a suprapubic catheter. Frank pus was draining-pending urine cultures. -Continue cefepime and vancomycin for now

## 2021-08-23 NOTE — Assessment & Plan Note (Signed)
Blood pressure now within normal limit.  Home dose of enalapril is being held due to AKI and initial softer blood pressure. -Continue holding enalapril until renal function stabilizes. -Continue with hydralazine as needed

## 2021-08-23 NOTE — Progress Notes (Signed)
°   08/23/21 1620  Hand-Off documentation  Handoff Given Given to Transfer Unit/facility  Report given to (Full Name) [name] RN  Handoff Received Received from Transfer Unit/facility  Report received from (Full Name) Doreen Beam RN   Patient level of care decreased to Med-Surg. Patient with new bed assignment of 1C -130. Report called at this time to [place].  Patient's son Roger Rodriguez [phone] called to update family about patient moving rooms due to a lower level of care needed.

## 2021-08-23 NOTE — Progress Notes (Signed)
Patient transported to 1C Room 130. Family at bedside during transport. Bedside handoff given to [facility]. Care turned over at this time.

## 2021-08-23 NOTE — Assessment & Plan Note (Signed)
-   Continue with carbidopa-levodopa and pramipexole

## 2021-08-23 NOTE — Progress Notes (Signed)
PHARMACY - PHYSICIAN COMMUNICATION CRITICAL VALUE ALERT - BLOOD CULTURE IDENTIFICATION (BCID)  Roger Rodriguez is an 81 y.o. male who presented to [facility] on 08/22/2021  Assessment:  2/4 bottles (2/2 sets) staph species  Name of physician (or Provider) Contacted: Dr. Reesa Chew  Current antibiotics: cefepime  Changes to prescribed antibiotics recommended: add vancomycin pending identification and clinical course  Results for orders placed or performed during the hospital encounter of 08/22/21  Blood Culture ID Panel (Reflexed) (Collected: 08/22/2021  1:04 PM)  Result Value Ref Range   Enterococcus faecalis NOT DETECTED NOT DETECTED   Enterococcus Faecium NOT DETECTED NOT DETECTED   Listeria monocytogenes NOT DETECTED NOT DETECTED   Staphylococcus species DETECTED (A) NOT DETECTED   Staphylococcus aureus (BCID) NOT DETECTED NOT DETECTED   Staphylococcus epidermidis NOT DETECTED NOT DETECTED   Staphylococcus lugdunensis NOT DETECTED NOT DETECTED   Streptococcus species NOT DETECTED NOT DETECTED   Streptococcus agalactiae NOT DETECTED NOT DETECTED   Streptococcus pneumoniae NOT DETECTED NOT DETECTED   Streptococcus pyogenes NOT DETECTED NOT DETECTED   A.calcoaceticus-baumannii NOT DETECTED NOT DETECTED   Bacteroides fragilis NOT DETECTED NOT DETECTED   Enterobacterales NOT DETECTED NOT DETECTED   Enterobacter cloacae complex NOT DETECTED NOT DETECTED   Escherichia coli NOT DETECTED NOT DETECTED   Klebsiella aerogenes NOT DETECTED NOT DETECTED   Klebsiella oxytoca NOT DETECTED NOT DETECTED   Klebsiella pneumoniae NOT DETECTED NOT DETECTED   Proteus species NOT DETECTED NOT DETECTED   Salmonella species NOT DETECTED NOT DETECTED   Serratia marcescens NOT DETECTED NOT DETECTED   Haemophilus influenzae NOT DETECTED NOT DETECTED   Neisseria meningitidis NOT DETECTED NOT DETECTED   Pseudomonas aeruginosa NOT DETECTED NOT DETECTED   Stenotrophomonas maltophilia NOT DETECTED NOT DETECTED    Candida albicans NOT DETECTED NOT DETECTED   Candida auris NOT DETECTED NOT DETECTED   Candida glabrata NOT DETECTED NOT DETECTED   Candida krusei NOT DETECTED NOT DETECTED   Candida parapsilosis NOT DETECTED NOT DETECTED   Candida tropicalis NOT DETECTED NOT DETECTED   Cryptococcus neoformans/gattii NOT DETECTED NOT DETECTED    Tawnya Crook, PharmD, BCPS Clinical Pharmacist 08/23/2021 11:16 AM

## 2021-08-23 NOTE — Assessment & Plan Note (Signed)
Patient met sepsis criteria with leukocytosis, hypothermia and tachycardia.  Most likely secondary to UTI.  Preliminary blood cultures with Staphylococcus species in both bottles. -Continue cefepime -Add vancomycin till more culture data is available

## 2021-08-23 NOTE — Assessment & Plan Note (Signed)
Anemia panel with folic acid deficiency, mild iron deficiency. -Start her on folic acid supplement

## 2021-08-23 NOTE — Hospital Course (Addendum)
Taken from H&P.  Roger Rodriguez is a 81 y.o. male with known history of Parkinson's disease, chronically bedbound, hypertension, diabetes mellitus, chronic suprapubic catheter was found to have decreased urine output from the urinary catheter and has an appointment with the urologist today.  Per patient's son patient was getting more lethargic since yesterday but today became more lethargic and was brought to the ER.  On arrival he was confused and hypotensive.  Received fluid bolus for concern of sepsis secondary to UTI.  Suprapubic catheter was clogged which was replaced by ER physician.  Frank pus was draining.  Blood and urine cultures were sent and he was started on cefepime.  Blood cultures came back positive for Staphylococcus species-pending identification. Vancomycin was added until more data is available.  2/12: 2/4 blood culture with staph hominis Urine cultures with gram-negative rods and Providencia rettgeri-pending susceptibility. Ordered repeat blood cultures  2/13: Repeat blood cultures remain negative so far.  Final urine culture results still pending. Patient with some upper extremity edema, left more than right-ordered venous Doppler.

## 2021-08-23 NOTE — Consult Note (Signed)
Pharmacy Antibiotic Note  Roger Rodriguez is a 81 y.o. male admitted on 08/22/2021 with pneumonia and UTI.  Pharmacy has been consulted for Cefepime dosing.  Blood cultures with 2/4 bottles (both sets) with staph species. Will start vancomycin pending further identification and clinical course.  Plan: Vancomycin 2 g followed by vancomycin 1250 mg IV q24h  Goal AUC 400-550 Expected AUC: 477 SCr used: 1.53  Adjust Cefepime 2g IV Q12 hours  Will continue to monitor renal function and adjust dose as appropriate.  Height: 6' (182.9 cm) Weight: 93.4 kg (205 lb 14.6 oz) IBW/kg (Calculated) : 77.6  Temp (24hrs), Avg:98.2 F (36.8 C), Min:97.7 F (36.5 C), Max:98.9 F (37.2 C)  Recent Labs  Lab 08/22/21 1304 08/22/21 1309 08/22/21 1530 08/22/21 1757 08/22/21 1850 08/22/21 2052 08/23/21 0518 08/23/21 0907  WBC 12.4*  --   --  9.5  --   --  10.3  --   CREATININE 2.25*  --   --  2.15*  --   --  1.53*  --   LATICACIDVEN  --  2.2* 3.4*  --  2.9* 2.7*  --  1.1     Estimated Creatinine Clearance: 44.9 mL/min (A) (by C-G formula based on SCr of 1.53 mg/dL (H)).    Allergies  Allergen Reactions   Sulfa Antibiotics Swelling    Lip swelling    Antimicrobials this admission: Ceftriaxone 2/10 x1 Cefepime 2/10 >>  Vancomycin 2/11 >>  Microbiology results: 2/10 BCx: 2/4 (both sets) staph species 2/10 UCx: pending   Thank you for allowing pharmacy to be a part of this patients care.  Tawnya Crook, PharmD, BCPS Clinical Pharmacist 08/23/2021 10:29 AM

## 2021-08-24 LAB — CBC
HCT: 37.5 % — ABNORMAL LOW (ref 39.0–52.0)
Hemoglobin: 12.3 g/dL — ABNORMAL LOW (ref 13.0–17.0)
MCH: 32.1 pg (ref 26.0–34.0)
MCHC: 32.8 g/dL (ref 30.0–36.0)
MCV: 97.9 fL (ref 80.0–100.0)
Platelets: 179 10*3/uL (ref 150–400)
RBC: 3.83 MIL/uL — ABNORMAL LOW (ref 4.22–5.81)
RDW: 14.6 % (ref 11.5–15.5)
WBC: 8.7 10*3/uL (ref 4.0–10.5)
nRBC: 0 % (ref 0.0–0.2)

## 2021-08-24 LAB — BASIC METABOLIC PANEL
Anion gap: 8 (ref 5–15)
BUN: 22 mg/dL (ref 8–23)
CO2: 27 mmol/L (ref 22–32)
Calcium: 8.7 mg/dL — ABNORMAL LOW (ref 8.9–10.3)
Chloride: 101 mmol/L (ref 98–111)
Creatinine, Ser: 0.98 mg/dL (ref 0.61–1.24)
GFR, Estimated: >60 mL/min (ref >60)
Glucose, Bld: 126 mg/dL — ABNORMAL HIGH (ref 70–99)
Potassium: 3.7 mmol/L (ref 3.5–5.1)
Sodium: 136 mmol/L (ref 135–145)

## 2021-08-24 LAB — GLUCOSE, CAPILLARY
Glucose-Capillary: 145 mg/dL — ABNORMAL HIGH (ref 70–99)
Glucose-Capillary: 180 mg/dL — ABNORMAL HIGH (ref 70–99)
Glucose-Capillary: 128 mg/dL — ABNORMAL HIGH (ref 70–99)
Glucose-Capillary: 148 mg/dL — ABNORMAL HIGH (ref 70–99)

## 2021-08-24 MED ORDER — QUETIAPINE FUMARATE 25 MG PO TABS
25.0000 mg | ORAL_TABLET | Freq: Every day | ORAL | Status: DC
  Administered 2021-08-24 – 2021-08-26 (×3): 25 mg via ORAL
  Filled 2021-08-24 (×3): qty 1

## 2021-08-24 MED ORDER — VANCOMYCIN HCL 1750 MG/350ML IV SOLN
1750.0000 mg | INTRAVENOUS | Status: DC
  Administered 2021-08-24 – 2021-08-25 (×2): 1750 mg via INTRAVENOUS
  Filled 2021-08-24 (×3): qty 350

## 2021-08-24 MED ORDER — PRAMIPEXOLE DIHYDROCHLORIDE 1 MG PO TABS
1.0000 mg | ORAL_TABLET | Freq: Every day | ORAL | Status: DC
  Administered 2021-08-24 – 2021-08-26 (×3): 1 mg via ORAL
  Filled 2021-08-24 (×5): qty 1

## 2021-08-24 MED ORDER — SODIUM CHLORIDE 0.9 % IV SOLN
2.0000 g | Freq: Three times a day (TID) | INTRAVENOUS | Status: DC
  Administered 2021-08-24 – 2021-08-26 (×6): 2 g via INTRAVENOUS
  Filled 2021-08-24 (×11): qty 2

## 2021-08-24 NOTE — Assessment & Plan Note (Signed)
SSI

## 2021-08-24 NOTE — Progress Notes (Signed)
Progress Note   Patient: Roger Rodriguez BLT:[id number] DOB: 1941-07-07 DOA: 08/22/2021     2 DOS: the patient was seen and examined on 08/24/2021   Brief hospital course: Taken from H&P.  Roger Rodriguez is a 81 y.o. male with known history of Parkinson's disease, chronically bedbound, hypertension, diabetes mellitus, chronic suprapubic catheter was found to have decreased urine output from the urinary catheter and has an appointment with the urologist today.  Per patient's son patient was getting more lethargic since yesterday but today became more lethargic and was brought to the ER.  On arrival he was confused and hypotensive.  Received fluid bolus for concern of sepsis secondary to UTI.  Suprapubic catheter was clogged which was replaced by ER physician.  Frank pus was draining.  Blood and urine cultures were sent and he was started on cefepime.  Blood cultures came back positive for Staphylococcus species-pending identification. Vancomycin was added until more data is available.  2/12: 2/4 blood culture with staph hominis Urine cultures with gram-negative rods and Providencia rettgeri-pending susceptibility. Ordered repeat blood cultures   Assessment and Plan: * Sepsis ([place])- (present on admission) Patient met sepsis criteria with leukocytosis, hypothermia and tachycardia.  Most likely secondary to UTI.  Preliminary blood cultures with Staphylococcus hominis in both bottles.  Urine cultures with gram-negative rods and Providencia rettgeri-pending susceptibility. -Continue cefepime -Add vancomycin till more culture data is available -Repeat blood cultures  Complicated UTI (urinary tract infection)- (present on admission) Patient is bedbound at baseline with a suprapubic catheter. Frank pus was draining-pending urine cultures. -Continue cefepime and vancomycin for now  AKI (acute kidney injury) ([place]) Resolved. Most likely secondary to complicated UTI and clogged suprapubic  catheter. Renal function seems improving, creatinine at 1.53 with baseline less than 1 -Monitor renal function -Avoid nephrotoxins  Essential (primary) hypertension- (present on admission) Blood pressure now within normal limit.  Home dose of enalapril is being held due to AKI and initial softer blood pressure. -Continue holding enalapril-we will restart if needed -Continue with hydralazine as needed  Parkinson's disease ([place])- (present on admission) - Continue with carbidopa-levodopa and pramipexole  Diabetes mellitus type 2 in nonobese (HCC) - SSI  Pressure injury of skin Pressure Injury 08/23/21 Sacrum Stage 1 -  Intact skin with non-blanchable redness of a localized area usually over a bony prominence. (Active)  08/23/21 0755  Location: Sacrum  Location Orientation:   Staging: Stage 1 -  Intact skin with non-blanchable redness of a localized area usually over a bony prominence.  Wound Description (Comments):   Present on Admission: Yes       Macrocytic anemia Anemia panel with folic acid deficiency, mild iron deficiency. -Start her on folic acid supplement   Subjective: Patient seems improving, able to eat his breakfast with the help of his son.  Stating that appetite is improving.  Physical Exam: Vitals:   08/24/21 0341 08/24/21 0922 08/24/21 1133 08/24/21 1149  BP: 135/73 (!) 142/81 (!) 145/87 102/72  Pulse: 98 89 90 84  Resp: '16 18 18 18  ' Temp: 99 F (37.2 C) 98.2 F (36.8 C) 98.4 F (36.9 C) 98 F (36.7 C)  TempSrc: Axillary     SpO2: 93% 94% 95% 100%  Weight:      Height:       General.  Elderly gentleman with a flat affect, in no acute distress. Pulmonary.  Lungs clear bilaterally, normal respiratory effort. CV.  Regular rate and rhythm, no JVD, rub or murmur. Abdomen.  Soft,  nontender, nondistended, BS positive. CNS.  Alert and oriented .  No focal neurologic deficit. Extremities.  No edema, no cyanosis, pulses intact and symmetrical. Psychiatry.   Judgment and insight appears impaired.  Data Reviewed: Prior notes and labs reviewed  Family Communication: Discussed with son at bedside  Disposition: Status is: Inpatient Remains inpatient appropriate because: Severity of illness  Planned Discharge Destination: Home with Home Health  DVT prophylaxis.  Lovenox  Time spent: 45 minutes  This record has been created using Systems analyst. Errors have been sought and corrected,but may not always be located. Such creation errors do not reflect on the standard of care.  Author: Lorella Nimrod, MD 08/24/2021 4:19 PM  For on call review [email or web address].

## 2021-08-24 NOTE — Assessment & Plan Note (Signed)
Resolved. Most likely secondary to complicated UTI and clogged suprapubic catheter. Renal function seems improving, creatinine at 1.53 with baseline less than 1 -Monitor renal function -Avoid nephrotoxins

## 2021-08-24 NOTE — Consult Note (Signed)
Pharmacy Antibiotic Note  Roger Rodriguez is a 81 y.o. male admitted on 08/22/2021 with pneumonia and UTI.  Pharmacy has been consulted for Cefepime dosing.  Blood cultures with 2/4 bottles (both sets) with staph species. Will start vancomycin pending further identification and clinical course.  Plan: Will adjust Vancomycin to 1750 mg IV q24h  Goal AUC 400-550 Expected AUC: 446.7 SCr used: 0.98  Adjust Cefepime 2g IV Q8 hours  Will continue to monitor renal function and adjust dose as appropriate.  Height: 6' (182.9 cm) Weight: 93.4 kg (205 lb 14.6 oz) IBW/kg (Calculated) : 77.6  Temp (24hrs), Avg:98.3 F (36.8 C), Min:97.8 F (36.6 C), Max:99 F (37.2 C)  Recent Labs  Lab 08/22/21 1304 08/22/21 1309 08/22/21 1530 08/22/21 1757 08/22/21 1850 08/22/21 2052 08/23/21 0518 08/23/21 0907 08/24/21 0336  WBC 12.4*  --   --  9.5  --   --  10.3  --  8.7  CREATININE 2.25*  --   --  2.15*  --   --  1.53*  --  0.98  LATICACIDVEN  --  2.2* 3.4*  --  2.9* 2.7*  --  1.1  --      Estimated Creatinine Clearance: 70.2 mL/min (by C-G formula based on SCr of 0.98 mg/dL).    Allergies  Allergen Reactions   Sulfa Antibiotics Swelling    Lip swelling    Antimicrobials this admission: Ceftriaxone 2/10 x1 Cefepime 2/10 >>  Vancomycin 2/11 >>  Microbiology results: 2/10 BCx: 2/4 (both sets) staph species 2/10 UCx: pending   Thank you for allowing pharmacy to be a part of this patients care.  Pearla Dubonnet, PharmD Clinical Pharmacist 08/24/2021 10:01 AM

## 2021-08-24 NOTE — Assessment & Plan Note (Signed)
Anemia panel with folic acid deficiency, mild iron deficiency. -Start her on folic acid supplement

## 2021-08-24 NOTE — Assessment & Plan Note (Signed)
Blood pressure now within normal limit.  Home dose of enalapril is being held due to AKI and initial softer blood pressure. -Continue holding enalapril-we will restart if needed -Continue with hydralazine as needed

## 2021-08-24 NOTE — Assessment & Plan Note (Signed)
Patient is bedbound at baseline with a suprapubic catheter. Frank pus was draining-pending urine cultures. -Continue cefepime and vancomycin for now

## 2021-08-24 NOTE — Assessment & Plan Note (Signed)
-   Continue with carbidopa-levodopa and pramipexole

## 2021-08-24 NOTE — Assessment & Plan Note (Signed)
Patient met sepsis criteria with leukocytosis, hypothermia and tachycardia.  Most likely secondary to UTI.  Preliminary blood cultures with Staphylococcus hominis in both bottles.  Urine cultures with gram-negative rods and Providencia rettgeri-pending susceptibility. -Continue cefepime -Add vancomycin till more culture data is available -Repeat blood cultures

## 2021-08-25 ENCOUNTER — Inpatient Hospital Stay: Payer: PPO

## 2021-08-25 DIAGNOSIS — R339 Retention of urine, unspecified: Secondary | ICD-10-CM

## 2021-08-25 DIAGNOSIS — R7881 Bacteremia: Secondary | ICD-10-CM | POA: Diagnosis not present

## 2021-08-25 DIAGNOSIS — N179 Acute kidney failure, unspecified: Secondary | ICD-10-CM

## 2021-08-25 DIAGNOSIS — R600 Localized edema: Secondary | ICD-10-CM

## 2021-08-25 DIAGNOSIS — G20 Parkinson's disease: Secondary | ICD-10-CM

## 2021-08-25 DIAGNOSIS — B957 Other staphylococcus as the cause of diseases classified elsewhere: Secondary | ICD-10-CM

## 2021-08-25 LAB — CULTURE, BLOOD (ROUTINE X 2)
Special Requests: ADEQUATE
Special Requests: ADEQUATE

## 2021-08-25 LAB — GLUCOSE, CAPILLARY
Glucose-Capillary: 119 mg/dL — ABNORMAL HIGH (ref 70–99)
Glucose-Capillary: 125 mg/dL — ABNORMAL HIGH (ref 70–99)
Glucose-Capillary: 124 mg/dL — ABNORMAL HIGH (ref 70–99)
Glucose-Capillary: 154 mg/dL — ABNORMAL HIGH (ref 70–99)

## 2021-08-25 MED ORDER — GABAPENTIN 100 MG PO CAPS
200.0000 mg | ORAL_CAPSULE | Freq: Three times a day (TID) | ORAL | Status: DC
  Administered 2021-08-26 – 2021-08-27 (×4): 200 mg via ORAL
  Filled 2021-08-25 (×4): qty 2

## 2021-08-25 MED ORDER — ENALAPRIL MALEATE 2.5 MG PO TABS
2.5000 mg | ORAL_TABLET | Freq: Two times a day (BID) | ORAL | Status: DC
  Administered 2021-08-25 – 2021-08-27 (×5): 2.5 mg via ORAL
  Filled 2021-08-25 (×7): qty 1

## 2021-08-25 MED ORDER — POLYVINYL ALCOHOL 1.4 % OP SOLN
1.0000 [drp] | OPHTHALMIC | Status: DC | PRN
  Filled 2021-08-25: qty 15

## 2021-08-25 NOTE — Clinical Social Work Note (Signed)
°  Transition of Care [facility]) Screening Note   Patient Details  Name: Roger Rodriguez Date of Birth: [birth year]   [facility] CM/SW Contact:    Eileen Stanford, LCSW Phone [place] 08/25/2021, 3:38 PM    Transition of Care Department [facility]) has reviewed patient and no TOC needs have been identified at this time. We will continue to monitor patient advancement through interdisciplinary progression rounds. If new patient transition needs arise, please place a TOC consult.

## 2021-08-25 NOTE — Assessment & Plan Note (Signed)
Patient met sepsis criteria with leukocytosis, hypothermia and tachycardia.  Most likely secondary to UTI.  Preliminary blood cultures with Staphylococcus hominis in both bottles.  Urine cultures with gram-negative rods and Providencia rettgeri-pending susceptibility.  Repeat blood cultures negative so far. -Continue cefepime -Continue vancomycin -ID consult

## 2021-08-25 NOTE — Care Management Important Message (Signed)
Important Message  Patient Details  Name: Roger Rodriguez MRN: [id number] Date of Birth: [birth year]   Medicare Important Message Given:  N/A - LOS <3 / Initial given by admissions     [name] [name] 08/25/2021, 8:26 AM

## 2021-08-25 NOTE — Assessment & Plan Note (Signed)
Blood pressure started trending up.  Home dose of enalapril is being held due to AKI and initial softer blood pressure. -Resume home enalapril -Continue with hydralazine as needed

## 2021-08-25 NOTE — Consult Note (Signed)
NAME: Roger Rodriguez  DOB: 21-Mar-1941  MRN: [id number]  Date/Time: 08/25/2021 7:21 PM  REQUESTING PROVIDER:Dr>Amin Subjective:  REASON FOR CONSULT: Suprapubic cath related UTI, Staph hominis bacteremia Not able to get a history from patient.  Chart reviewed. Roger Rodriguez is a 81 y.o.male with a history of Parkinson's disease, hyperlipidemia, diabetes mellitus a suprapubic catheter presented to the ED on 08/22/2021 with decrease output.  The suprapubic catheter was blocked the day before he presented to the ED.  The home health aide had spoken to the urologist office and the patient was scheduled to go to the the next day for change of catheter.  But he became increasingly weak and confused and less responsive and hence was brought to the ED.Roger Rodriguez  Patient has had similar problems and the catheter had to be replaced on 08/14/2021. There was no fever. In the ED BP 163/145, pulse 87, temperature 98.9 and sats of 91% Labs revealed WBC of 12.4, creatinine 2.05, UA had more than 50 WBC.  Blood culture was sent and urine culture was sent and patient was started on ceftriaxone. The blood culture was positive for staph hominis and repeat cultures were sent and he was started on vancomycin.  The urine culture came back as Providencia rettgeri.  I am asked to see the patient for management.  Patient is currently on vancomycin and cefepime.  Past Medical History:  Diagnosis Date   Allergy    allergic rhinitis   Arthritis    OA   Chronic kidney disease    stones   Depression    Diabetes mellitus without complication (HCC)    Family history of adverse reaction to anesthesia    Hematuria    History of kidney stones    Hyperlipidemia    Hypertension    no meds since parkinsons meds started   Incontinence of urine    Inguinal hernia    Neuromuscular disorder ([place])    parkinsons   Overactive bladder    Parkinson disease ([place])    Parkinson's disease ([place])    Prostate cancer ([place])    Rhinitis, allergic     Shortness of breath dyspnea    doe secondary to parkinsons    Past Surgical History:  Procedure Laterality Date   BACK SURGERY  2014   CYSTOSCOPY WITH STENT PLACEMENT Right 01/13/2016   Procedure: CYSTOSCOPY WITH STENT PLACEMENT;  Surgeon: Hollice Espy, MD;  Location: [facility];  Service: Urology;  Laterality: Right;   CYSTOSCOPY/URETEROSCOPY/HOLMIUM LASER/STENT PLACEMENT Left 11/20/2015   Procedure: CYSTOSCOPY/URETEROSCOPY/HOLMIUM LASER/STENT PLACEMENT;  Surgeon: Hollice Espy, MD;  Location: [facility];  Service: Urology;  Laterality: Left;   EYE SURGERY     FRACTURE SURGERY      ankle    H/O arthroscopy of right knee  2015   H/O radical protatectomy     HERNIA REPAIR     JOINT REPLACEMENT     right knee   LITHOTRIPSY  2010   Rotator Cuff tear  1993   Repaired   TOTAL KNEE REVISION Right 03/17/2018   Procedure: TOTAL KNEE REVISION;  Surgeon: Lovell Sheehan, MD;  Location: [facility];  Service: Orthopedics;  Laterality: Right;   TOTAL KNEE REVISION Right 06/08/2018   Procedure: TOTAL KNEE REVISION;  Surgeon: Lovell Sheehan, MD;  Location: [facility];  Service: Orthopedics;  Laterality: Right;   URETEROSCOPY WITH HOLMIUM LASER LITHOTRIPSY  2002?   URETEROSCOPY WITH HOLMIUM LASER LITHOTRIPSY Right 01/13/2016   Procedure: URETEROSCOPY WITH HOLMIUM LASER LITHOTRIPSY;  Surgeon: Hollice Espy, MD;  Location: [facility];  Service: Urology;  Laterality: Right;   VASECTOMY     History    Social History   Socioeconomic History   Marital status: Widowed    Spouse name: Not on file   Number of children: 2   Years of education: Not on file   Highest education level: Some college, no degree  Occupational History   Occupation: retired  Tobacco Use   Smoking status: Former    Types: Cigars    Quit date: 11/11/1968    Years since quitting: 52.8   Smokeless tobacco: Former    Types: Chew    Quit date: 11/12/2003   Tobacco comments:    Occassionally, also chewed tobacco till 2005. Quit smoking  1973  Vaping Use   Vaping Use: Never used  Substance and Sexual Activity   Alcohol use: No    Alcohol/week: 0.0 standard drinks   Drug use: No   Sexual activity: Not Currently  Other Topics Concern   Not on file  Social History Narrative   Not on file   Social Determinants of Health   Financial Resource Strain: Low Risk    Difficulty of Paying Living Expenses: Not hard at all  Food Insecurity: No Food Insecurity   Worried About Charity fundraiser in the Last Year: Never true   [place] in the Last Year: Never true  Transportation Needs: No Transportation Needs   Lack of Transportation (Medical): No   Lack of Transportation (Non-Medical): No  Physical Activity: Inactive   Days of Exercise per Week: 0 days   Minutes of Exercise per Session: 0 min  Stress: No Stress Concern Present   Feeling of Stress : Only a little  Social Connections: Socially Isolated   Frequency of Communication with Friends and Family: More than three times a week   Frequency of Social Gatherings with Friends and Family: More than three times a week   Attends Religious Services: Never   Marine scientist or Organizations: No   Attends Archivist Meetings: Never   Marital Status: Widowed  Human resources officer Violence: Not At Risk   Fear of Current or Ex-Partner: No   Emotionally Abused: No   Physically Abused: No   Sexually Abused: No    Family History  Problem Relation Age of Onset   Dementia Mother    Cerebral aneurysm Father    Bladder Cancer Paternal Uncle    Liver cancer Sister    Prostate cancer Neg Hx    Kidney cancer Neg Hx    Allergies  Allergen Reactions   Sulfa Antibiotics Swelling    Lip swelling   I? Current Facility-Administered Medications  Medication Dose Route Frequency Provider Last Rate Last Admin   acetaminophen (TYLENOL) tablet 650 mg  650 mg Oral Q6H PRN Rise Patience, MD       Or   acetaminophen (TYLENOL) suppository 650 mg  650 mg Rectal  Q6H PRN Rise Patience, MD       Carbidopa-Levodopa ER 23.75-95 MG CPCR 1 capsule  1 capsule Oral QID Sharion Settler, NP   1 capsule at 08/25/21 1759   Carbidopa-Levodopa ER 61.25-245 MG CPCR 1 capsule  1 capsule Oral QID Sharion Settler, NP   1 capsule at 08/25/21 1758   ceFEPIme (MAXIPIME) 2 g in sodium chloride 0.9 % 100 mL IVPB  2 g Intravenous Q8H Nazari, Walid A, RPH   Stopped at 08/25/21 1416  Chlorhexidine Gluconate Cloth 2 % PADS 6 each  6 each Topical Daily Rise Patience, MD   6 each at 08/25/21 1305   enalapril (VASOTEC) tablet 2.5 mg  2.5 mg Oral BID Lorella Nimrod, MD   2.5 mg at 08/25/21 1758   enoxaparin (LOVENOX) injection 40 mg  40 mg Subcutaneous Q24H Rise Patience, MD   40 mg at 30/07/62 2633   folic acid (FOLVITE) tablet 1 mg  1 mg Oral Daily Lorella Nimrod, MD   1 mg at 08/25/21 1001   insulin aspart (novoLOG) injection 0-6 Units  0-6 Units Subcutaneous TID WC Rise Patience, MD   1 Units at 08/24/21 1159   polyvinyl alcohol (LIQUIFILM TEARS) 1.4 % ophthalmic solution 1 drop  1 drop Both Eyes PRN Lorella Nimrod, MD       pramipexole (MIRAPEX) tablet 1 mg  1 mg Oral QHS Lorella Nimrod, MD   1 mg at 08/24/21 2102   QUEtiapine (SEROQUEL) tablet 25 mg  25 mg Oral QHS Lorella Nimrod, MD   25 mg at 08/24/21 2102   vancomycin (VANCOREADY) IVPB 1750 mg/350 mL  1,750 mg Intravenous Q24H Rito Ehrlich A, RPH   Stopped at 08/25/21 1222     Abtx:  Anti-infectives (From admission, onward)    Start     Dose/Rate Route Frequency Ordered Stop   08/24/21 1100  vancomycin (VANCOREADY) IVPB 1750 mg/350 mL        1,750 mg 175 mL/hr over 120 Minutes Intravenous Every 24 hours 08/24/21 0957     08/24/21 1100  ceFEPIme (MAXIPIME) 2 g in sodium chloride 0.9 % 100 mL IVPB        2 g 200 mL/hr over 30 Minutes Intravenous Every 8 hours 08/24/21 0959     08/24/21 1000  vancomycin (VANCOREADY) IVPB 1250 mg/250 mL  Status:  Discontinued        1,250 mg 166.7 mL/hr over 90  Minutes Intravenous Every 24 hours 08/23/21 1025 08/24/21 0957   08/23/21 1000  ceFEPIme (MAXIPIME) 1 g in sodium chloride 0.9 % 100 mL IVPB  Status:  Discontinued        1 g 200 mL/hr over 30 Minutes Intravenous Every 24 hours 08/22/21 1726 08/23/21 0727   08/23/21 1000  ceFEPIme (MAXIPIME) 2 g in sodium chloride 0.9 % 100 mL IVPB  Status:  Discontinued        2 g 200 mL/hr over 30 Minutes Intravenous Every 12 hours 08/23/21 0727 08/24/21 0959   08/23/21 0900  vancomycin (VANCOREADY) IVPB 2000 mg/400 mL        2,000 mg 200 mL/hr over 120 Minutes Intravenous  Once 08/23/21 0808 08/23/21 1155   08/22/21 1730  ceFEPIme (MAXIPIME) 2 g in sodium chloride 0.9 % 100 mL IVPB  Status:  Discontinued        2 g 200 mL/hr over 30 Minutes Intravenous  Once 08/22/21 1722 08/22/21 1725   08/22/21 1330  cefTRIAXone (ROCEPHIN) 2 g in sodium chloride 0.9 % 100 mL IVPB        2 g 200 mL/hr over 30 Minutes Intravenous  Once 08/22/21 1315 08/22/21 1702       REVIEW OF SYSTEMS:  ,NA Objective:  VITALS:  BP (!) 145/78 (BP Location: Left Arm)    Pulse 84    Temp 97.7 F (36.5 C)    Resp 16    Ht 6' (1.829 m)    Wt 93.4 kg    SpO2 95%  BMI 27.93 kg/m  PHYSICAL EXAM:  General: Awake, speech soft, nasal and stuttering and difficult to understand Head: Normocephalic, without obvious abnormality, atraumatic. Eyes: Conjunctivae clear, anicteric sclerae. Pupils are equal ENT Nares normal. No drainage or sinus tenderness. Lips, mucosa, and tongue normal. No Thrush Neck:  symmetrical, no adenopathy, thyroid: non tender no carotid bruit and no JVD. Back: did not examine Lungs:b/l air entry Heart: Regular rate and rhythm, no murmur, rub or gallop. Abdomen: Soft, SPC cath Extremities: atraumatic, no cyanosis. No edema. No clubbing Skin: No rashes or lesions. Or bruising Lymph: Cervical, supraclavicular normal. Neurologic: did not assess Involuntary movts Pertinent Labs Lab Results CBC    Component  Value Date/Time   WBC 8.7 08/24/2021 0336   RBC 3.83 (L) 08/24/2021 0336   HGB 12.3 (L) 08/24/2021 0336   HGB 14.8 12/31/2020 1516   HCT 37.5 (L) 08/24/2021 0336   HCT 42.5 12/31/2020 1516   PLT 179 08/24/2021 0336   PLT 240 12/31/2020 1516   MCV 97.9 08/24/2021 0336   MCV 90 12/31/2020 1516   MCV 95 04/03/2014 0509   MCH 32.1 08/24/2021 0336   MCHC 32.8 08/24/2021 0336   RDW 14.6 08/24/2021 0336   RDW 12.5 12/31/2020 1516   RDW 13.9 04/03/2014 0509   LYMPHSABS 0.8 08/23/2021 0518   LYMPHSABS 1.1 12/31/2020 1516   LYMPHSABS 0.8 (L) 04/03/2014 0509   MONOABS 1.2 (H) 08/23/2021 0518   MONOABS 1.2 (H) 04/03/2014 0509   EOSABS 0.1 08/23/2021 0518   EOSABS 0.1 12/31/2020 1516   EOSABS 0.2 04/03/2014 0509   BASOSABS 0.0 08/23/2021 0518   BASOSABS 0.0 12/31/2020 1516   BASOSABS 0.0 04/03/2014 0509   BASOSABS 0 11/24/2012 1220    CMP Latest Ref Rng & Units 08/24/2021 08/23/2021 08/22/2021  Glucose 70 - 99 mg/dL 126(H) 130(H) -  BUN 8 - 23 mg/dL 22 30(H) -  Creatinine 0.61 - 1.24 mg/dL 0.98 1.53(H) 2.15(H)  Sodium 135 - 145 mmol/L 136 133(L) -  Potassium 3.5 - 5.1 mmol/L 3.7 4.1 -  Chloride 98 - 111 mmol/L 101 99 -  CO2 22 - 32 mmol/L 27 28 -  Calcium 8.9 - 10.3 mg/dL 8.7(L) 8.3(L) -  Total Protein 6.5 - 8.1 g/dL - 6.6 -  Total Bilirubin 0.3 - 1.2 mg/dL - 0.8 -  Alkaline Phos 38 - 126 U/L - 56 -  AST 15 - 41 U/L - 9(L) -  ALT 0 - 44 U/L - <5 -      Microbiology: Recent Results (from the past 240 hour(s))  Resp Panel by RT-PCR (Flu A&B, Covid) Nasopharyngeal Swab     Status: None   Collection Time: 08/22/21  1:04 PM   Specimen: Nasopharyngeal Swab; Nasopharyngeal(NP) swabs in vial transport medium  Result Value Ref Range Status   SARS Coronavirus 2 by RT PCR NEGATIVE NEGATIVE Final    Comment: (NOTE) SARS-CoV-2 target nucleic acids are NOT DETECTED.  The SARS-CoV-2 RNA is generally detectable in upper respiratory specimens during the acute phase of infection. The  lowest concentration of SARS-CoV-2 viral copies this assay can detect is 138 copies/mL. A negative result does not preclude SARS-Cov-2 infection and should not be used as the sole basis for treatment or other patient management decisions. A negative result may occur with  improper specimen collection/handling, submission of specimen other than nasopharyngeal swab, presence of viral mutation(s) within the areas targeted by this assay, and inadequate number of viral copies(<138 copies/mL). A negative result must be combined with  clinical observations, patient history, and epidemiological information. The expected result is Negative.  Fact Sheet for Patients:  EntrepreneurPulse.com.au  Fact Sheet for Healthcare Providers:  IncredibleEmployment.be  This test is no t yet approved or cleared by the [place] FDA and  has been authorized for detection and/or diagnosis of SARS-CoV-2 by FDA under an Emergency Use Authorization (EUA). This EUA will remain  in effect (meaning this test can be used) for the duration of the COVID-19 declaration under Section 564(b)(1) of the Act, 21 U.S.C.section 360bbb-3(b)(1), unless the authorization is terminated  or revoked sooner.       Influenza A by PCR NEGATIVE NEGATIVE Final   Influenza B by PCR NEGATIVE NEGATIVE Final    Comment: (NOTE) The Xpert Xpress SARS-CoV-2/FLU/RSV plus assay is intended as an aid in the diagnosis of influenza from Nasopharyngeal swab specimens and should not be used as a sole basis for treatment. Nasal washings and aspirates are unacceptable for Xpert Xpress SARS-CoV-2/FLU/RSV testing.  Fact Sheet for Patients: EntrepreneurPulse.com.au  Fact Sheet for Healthcare Providers: IncredibleEmployment.be  This test is not yet approved or cleared by the [place] FDA and has been authorized for detection and/or diagnosis of SARS-CoV-2 by FDA under  an Emergency Use Authorization (EUA). This EUA will remain in effect (meaning this test can be used) for the duration of the COVID-19 declaration under Section 564(b)(1) of the Act, 21 U.S.C. section 360bbb-3(b)(1), unless the authorization is terminated or revoked.  Performed at [facility], [address] [place], [place] [address]   Blood Culture (routine x 2)     Status: Abnormal   Collection Time: 08/22/21  1:04 PM   Specimen: BLOOD  Result Value Ref Range Status   Specimen Description   Final    BLOOD RIGHT ANTECUBITAL Performed at [facility], [address] [place], [place] [address]    Special Requests   Final    BOTTLES DRAWN AEROBIC AND ANAEROBIC Blood Culture adequate volume Performed at [facility], [address] [place], [place] [address]    Culture  Setup Time   Final    GRAM POSITIVE COCCI AEROBIC BOTTLE ONLY CRITICAL RESULT CALLED TO, READ BACK BY AND VERIFIED WITH: Herbert Pun AT [place] 08/23/21.PMF Performed at [facility], [place] [address], [place], [place] [address]    Culture STAPHYLOCOCCUS HOMINIS (A)  Final   Report Status 08/25/2021 FINAL  Final   Organism ID, Bacteria STAPHYLOCOCCUS HOMINIS  Final      Susceptibility   Staphylococcus hominis - MIC*    CIPROFLOXACIN <=0.5 SENSITIVE Sensitive     ERYTHROMYCIN >=8 RESISTANT Resistant     GENTAMICIN <=0.5 SENSITIVE Sensitive     OXACILLIN >=4 RESISTANT Resistant     TETRACYCLINE 2 SENSITIVE Sensitive     VANCOMYCIN <=0.5 SENSITIVE Sensitive     TRIMETH/SULFA 80 RESISTANT Resistant     CLINDAMYCIN <=0.25 SENSITIVE Sensitive     RIFAMPIN <=0.5 SENSITIVE Sensitive     Inducible Clindamycin NEGATIVE Sensitive     * STAPHYLOCOCCUS HOMINIS  Urine Culture     Status: Abnormal (Preliminary result)   Collection Time: 08/22/21  1:04 PM   Specimen: In/Out Cath Urine  Result Value Ref Range Status   Specimen Description   Final    IN/OUT CATH URINE Performed at  [facility], [address] [place], [place] [address]    Special Requests   Final    NONE Performed at [facility], [address],  [place], [place] [address]    Culture (A)  Final    >=100,000 COLONIES/mL GRAM NEGATIVE RODS >=100,000 COLONIES/mL PROVIDENCIA RETTGERI IDENTIFICATION AND SUSCEPTIBILITIES TO FOLLOW Performed at [facility] [address], [place], [place] [address]    Report Status PENDING  Incomplete  Blood Culture ID Panel (Reflexed)     Status: Abnormal   Collection Time: 08/22/21  1:04 PM  Result Value Ref Range Status   Enterococcus faecalis NOT DETECTED NOT DETECTED Final   Enterococcus Faecium NOT DETECTED NOT DETECTED Final   Listeria monocytogenes NOT DETECTED NOT DETECTED Final   Staphylococcus species DETECTED (A) NOT DETECTED Final    Comment: CRITICAL RESULT CALLED TO, READ BACK BY AND VERIFIED WITH: Herbert Pun AT 0710 08/23/21.PMF    Staphylococcus aureus (BCID) NOT DETECTED NOT DETECTED Final   Staphylococcus epidermidis NOT DETECTED NOT DETECTED Final   Staphylococcus lugdunensis NOT DETECTED NOT DETECTED Final   Streptococcus species NOT DETECTED NOT DETECTED Final   Streptococcus agalactiae NOT DETECTED NOT DETECTED Final   Streptococcus pneumoniae NOT DETECTED NOT DETECTED Final   Streptococcus pyogenes NOT DETECTED NOT DETECTED Final   A.calcoaceticus-baumannii NOT DETECTED NOT DETECTED Final   Bacteroides fragilis NOT DETECTED NOT DETECTED Final   Enterobacterales NOT DETECTED NOT DETECTED Final   Enterobacter cloacae complex NOT DETECTED NOT DETECTED Final   Escherichia coli NOT DETECTED NOT DETECTED Final   Klebsiella aerogenes NOT DETECTED NOT DETECTED Final   Klebsiella oxytoca NOT DETECTED NOT DETECTED Final   Klebsiella pneumoniae NOT DETECTED NOT DETECTED Final   Proteus species NOT DETECTED NOT DETECTED Final   Salmonella species NOT DETECTED NOT DETECTED Final   Serratia marcescens NOT DETECTED NOT  DETECTED Final   Haemophilus influenzae NOT DETECTED NOT DETECTED Final   Neisseria meningitidis NOT DETECTED NOT DETECTED Final   Pseudomonas aeruginosa NOT DETECTED NOT DETECTED Final   Stenotrophomonas maltophilia NOT DETECTED NOT DETECTED Final   Candida albicans NOT DETECTED NOT DETECTED Final   Candida auris NOT DETECTED NOT DETECTED Final   Candida glabrata NOT DETECTED NOT DETECTED Final   Candida krusei NOT DETECTED NOT DETECTED Final   Candida parapsilosis NOT DETECTED NOT DETECTED Final   Candida tropicalis NOT DETECTED NOT DETECTED Final   Cryptococcus neoformans/gattii NOT DETECTED NOT DETECTED Final    Comment: Performed at [facility], [address] [place], [place] [address]  Blood Culture (routine x 2)     Status: Abnormal   Collection Time: 08/22/21  1:08 PM   Specimen: BLOOD  Result Value Ref Range Status   Specimen Description   Final    BLOOD BLOOD RIGHT FOREARM Performed at [facility], [address], [place], [place] [address]    Special Requests   Final    BOTTLES DRAWN AEROBIC AND ANAEROBIC Blood Culture adequate volume Performed at [facility], [address] [place], [place] [address]    Culture  Setup Time   Final    GRAM POSITIVE COCCI AEROBIC BOTTLE ONLY CRITICAL RESULT CALLED TO, READ BACK BY AND VERIFIED WITH: Herbert Pun AT 0710 08/23/21.PMF Performed at [facility], [address] [place], [place] [address]    Culture (A)  Final    STAPHYLOCOCCUS HOMINIS SUSCEPTIBILITIES PERFORMED ON PREVIOUS CULTURE WITHIN THE LAST 5 DAYS. Performed at [facility], [place] [address], [place], [place] [address]    Report Status 08/25/2021 FINAL  Final  CULTURE, BLOOD (ROUTINE X 2) w Reflex to ID Panel  Status: None (Preliminary result)   Collection Time: 08/24/21 12:38 PM   Specimen: BLOOD  Result Value Ref Range Status   Specimen Description BLOOD [facility]  Final   Special Requests BOTTLES DRAWN AEROBIC  AND ANAEROBIC BCAV  Final   Culture   Final    NO GROWTH < 24 HOURS Performed at [facility], [address] [place], [place] [address]    Report Status PENDING  Incomplete  CULTURE, BLOOD (ROUTINE X 2) w Reflex to ID Panel     Status: None (Preliminary result)   Collection Time: 08/24/21 12:49 PM   Specimen: BLOOD  Result Value Ref Range Status   Specimen Description BLOOD LAC  Final   Special Requests BOTTLES DRAWN AEROBIC AND ANAEROBIC BCAV  Final   Culture   Final    NO GROWTH < 24 HOURS Performed at [facility], [address], [place], [place] [address]    Report Status PENDING  Incomplete    IMAGING RESULTS: CXR - Persistent low lung volumes with probable patchy bibasilar atelectasis, similar to priors. No definite acute findings I have personally reviewed the films ? Impression/Recommendation ? Staphylococcus hominis positive blood cultures are contamination.  Hence we will discontinue vancomycin  Acute urinary retention secondary to obstructed suprapubic catheter.  It has been exchanged.  Providencia rettgeri in the urine culture.  Very likely was colonizing the catheter. Patient is currently on cefepime.  We will do a total of 3 days.  Can be discontinued after tomorrow. We will do an ultrasound of the kidneys to look for hydronephrosis.  Parkinson's disease on treatment   Discussed the management with the requesting provider note:  This document was prepared using Dragon voice recognition software and may include unintentional dictation errors.

## 2021-08-25 NOTE — Progress Notes (Signed)
Progress Note   Patient: Roger Rodriguez:[id number] DOB: [birth year] DOA: 08/22/2021     3 DOS: the patient was seen and examined on 08/25/2021   Brief hospital course: Taken from H&P.  Roger Rodriguez is a 81 y.o. male with known history of Parkinson's disease, chronically bedbound, hypertension, diabetes mellitus, chronic suprapubic catheter was found to have decreased urine output from the urinary catheter and has an appointment with the urologist today.  Per patient's son patient was getting more lethargic since yesterday but today became more lethargic and was brought to the ER.  On arrival he was confused and hypotensive.  Received fluid bolus for concern of sepsis secondary to UTI.  Suprapubic catheter was clogged which was replaced by ER physician.  Frank pus was draining.  Blood and urine cultures were sent and he was started on cefepime.  Blood cultures came back positive for Staphylococcus species-pending identification. Vancomycin was added until more data is available.  2/12: 2/4 blood culture with staph hominis Urine cultures with gram-negative rods and Providencia rettgeri-pending susceptibility. Ordered repeat blood cultures  2/13: Repeat blood cultures remain negative so far.  Final urine culture results still pending. Patient with some upper extremity edema, left more than right-ordered venous Doppler.   Assessment and Plan: * Sepsis ([place])- (present on admission) Patient met sepsis criteria with leukocytosis, hypothermia and tachycardia.  Most likely secondary to UTI.  Preliminary blood cultures with Staphylococcus hominis in both bottles.  Urine cultures with gram-negative rods and Providencia rettgeri-pending susceptibility.  Repeat blood cultures negative so far. -Continue cefepime -Continue vancomycin -ID consult  Complicated UTI (urinary tract infection)- (present on admission) Patient is bedbound at baseline with a suprapubic catheter. Frank pus was  draining-pending urine cultures. -Continue cefepime and vancomycin for now  AKI (acute kidney injury) ([place]) Resolved. Most likely secondary to complicated UTI and clogged suprapubic catheter. Renal function seems improving, creatinine at 1.53 with baseline less than 1 -Monitor renal function -Avoid nephrotoxins  Essential (primary) hypertension- (present on admission) Blood pressure started trending up.  Home dose of enalapril is being held due to AKI and initial softer blood pressure. -Resume home enalapril -Continue with hydralazine as needed  Parkinson's disease ([place])- (present on admission) - Continue with carbidopa-levodopa and pramipexole  Diabetes mellitus type 2 in nonobese (HCC) - SSI  Pressure injury of skin Pressure Injury 08/23/21 Sacrum Stage 1 -  Intact skin with non-blanchable redness of a localized area usually over a bony prominence. (Active)  08/23/21 0755  Location: Sacrum  Location Orientation:   Staging: Stage 1 -  Intact skin with non-blanchable redness of a localized area usually over a bony prominence.  Wound Description (Comments):   Present on Admission: Yes       Macrocytic anemia Anemia panel with folic acid deficiency, mild iron deficiency. -Start her on folic acid supplement  Edema of left upper extremity Patient developed worsening left upper extremity edema. -Venous Doppler studies to rule out DVT   Subjective: Patient was seen and examined today.  No family member at bedside.  No new complaints.  He wants to go home.  Continue to mumble, I was able to understand few sentences.  Physical Exam: Vitals:   08/24/21 1632 08/24/21 2043 08/25/21 0621 08/25/21 0816  BP: 136/80 131/70 128/75 (!) 145/78  Pulse: 79 83 80 84  Resp: '18 18 18 16  ' Temp: 98.4 F (36.9 C) 98.7 F (37.1 C) 98 F (36.7 C) 97.7 F (36.5 C)  TempSrc:  SpO2: 92% 95% 95% 95%  Weight:      Height:       General.  Chronically ill-appearing gentleman, in no  acute distress. Pulmonary.  Lungs clear bilaterally, normal respiratory effort. CV.  Regular rate and rhythm, no JVD, rub or murmur. Abdomen.  Soft, nontender, nondistended, BS positive. CNS.  Alert and oriented .  No focal neurologic deficit. Extremities.  UE edema, left more than right, no cyanosis, pulses intact and symmetrical. Psychiatry.  Judgment and insight appears impaired.  Data Reviewed: Prior notes and labs reviewed.  Family Communication: Discussed with son on phone.  Disposition: Status is: Inpatient Remains inpatient appropriate because: Severity of illness   Planned Discharge Destination: Home with Home Health  DVT prophylaxis.  Lovenox  Time spent: 45 minutes  This record has been created using Systems analyst. Errors have been sought and corrected,but may not always be located. Such creation errors do not reflect on the standard of care.  Author: Lorella Nimrod, MD 08/25/2021 2:09 PM  For on call review [email or web address].

## 2021-08-25 NOTE — Assessment & Plan Note (Signed)
Patient is bedbound at baseline with a suprapubic catheter. Frank pus was draining-pending urine cultures. -Continue cefepime and vancomycin for now

## 2021-08-25 NOTE — Assessment & Plan Note (Signed)
Patient developed worsening left upper extremity edema. -Venous Doppler studies to rule out DVT

## 2021-08-26 ENCOUNTER — Inpatient Hospital Stay: Payer: PPO

## 2021-08-26 ENCOUNTER — Other Ambulatory Visit: Payer: Self-pay | Admitting: Family Medicine

## 2021-08-26 LAB — CREATININE, SERUM
Creatinine, Ser: 0.85 mg/dL (ref 0.61–1.24)
GFR, Estimated: >60 mL/min (ref >60)

## 2021-08-26 LAB — GLUCOSE, CAPILLARY
Glucose-Capillary: 115 mg/dL — ABNORMAL HIGH (ref 70–99)
Glucose-Capillary: 135 mg/dL — ABNORMAL HIGH (ref 70–99)
Glucose-Capillary: 163 mg/dL — ABNORMAL HIGH (ref 70–99)
Glucose-Capillary: 171 mg/dL — ABNORMAL HIGH (ref 70–99)

## 2021-08-26 LAB — URINE CULTURE: Culture: >=100000 — AB

## 2021-08-26 MED ORDER — FOLIC ACID 1 MG PO TABS: 1.0000 mg | ORAL_TABLET | Freq: Every day | ORAL | 1 refills | Status: DC

## 2021-08-26 NOTE — Care Management Important Message (Signed)
Important Message  Patient Details  Name: Roger Rodriguez MRN: [id number] Date of Birth: July 09, 1941   Medicare Important Message Given:  Yes     [name] A [place] Coach 08/26/2021, 1:59 PM

## 2021-08-26 NOTE — Discharge Summary (Signed)
Physician Discharge Summary   Patient: Roger Rodriguez MRN: [id number] DOB: 1940-12-06  Admit date:     08/22/2021  Discharge date: 08/26/21  Discharge Physician: Lorella Nimrod   PCP: [name]., MD   Recommendations at discharge:  Follow-up with primary care provider in 1 week Follow up with your urologist Follow-up with your neurologist  Discharge Diagnoses: Principal Problem:   Sepsis ([place]) Active Problems:   Complicated UTI (urinary tract infection)   AKI (acute kidney injury) ([place])   Essential (primary) hypertension   Parkinson's disease ([place])   Diabetes mellitus type 2 in nonobese ([place])   Pressure injury of skin   Macrocytic anemia   Edema of left upper extremity   Hospital Course: Taken from H&P.  Roger Rodriguez is a 81 y.o. male with known history of Parkinson's disease, chronically bedbound, hypertension, diabetes mellitus, chronic suprapubic catheter was found to have decreased urine output from the urinary catheter and has an appointment with the urologist today.  Per patient's son patient was getting more lethargic since yesterday but today became more lethargic and was brought to the ER.  On arrival he was confused and hypotensive.  Received fluid bolus for concern of sepsis secondary to UTI.  Suprapubic catheter was clogged which was replaced by ER physician.  Frank pus was draining.  Blood and urine cultures were sent and he was started on cefepime.  Blood cultures came back positive for Staphylococcus species-pending identification. Vancomycin was added until more data is available.  2/12: 2/4 blood culture with staph hominis Urine cultures with gram-negative rods and Providencia rettgeri-pending susceptibility. Ordered repeat blood cultures  2/13: Repeat blood cultures remain negative so far.  Final urine culture results still pending. Patient with some upper extremity edema, left more than right-ordered venous Doppler.  Upper extremity venous  Doppler with a small superficial thrombus/thrombophlebitis around the IV site.  No sign of any DVT.  Infectious disease was also consulted based on his history of complicated UTI.  Blood cultures with staph hominis was thought to be a contaminant.  Urine cultures can be a colonization but patient received enough antibiotics to cover his positive culture.  There was no need to continuation of antibiotics.  He was also found to have folic acid deficiency-started on supplement.  He will continue with rest of his home medications and follow-up with his providers.  Assessment and Plan: * Sepsis ([place])- (present on admission) Patient met sepsis criteria with leukocytosis, hypothermia and tachycardia.  Most likely secondary to UTI.  Preliminary blood cultures with Staphylococcus hominis in both bottles.  Urine cultures with gram-negative rods and Providencia rettgeri-pending susceptibility.  Repeat blood cultures negative so far. -Continue cefepime -Continue vancomycin -ID consult  Complicated UTI (urinary tract infection)- (present on admission) Patient is bedbound at baseline with a suprapubic catheter. Frank pus was draining-pending urine cultures. -Continue cefepime and vancomycin for now  AKI (acute kidney injury) ([place]) Resolved. Most likely secondary to complicated UTI and clogged suprapubic catheter. Renal function seems improving, creatinine at 1.53 with baseline less than 1 -Monitor renal function -Avoid nephrotoxins  Essential (primary) hypertension- (present on admission) Blood pressure started trending up.  Home dose of enalapril is being held due to AKI and initial softer blood pressure. -Resume home enalapril -Continue with hydralazine as needed  Parkinson's disease ([place])- (present on admission) - Continue with carbidopa-levodopa and pramipexole  Diabetes mellitus type 2 in nonobese (HCC) - SSI  Pressure injury of skin Pressure Injury 08/23/21 Sacrum Stage 1 -  Intact  skin with non-blanchable redness of a localized area usually over a bony prominence. (Active)  08/23/21 0755  Location: Sacrum  Location Orientation:   Staging: Stage 1 -  Intact skin with non-blanchable redness of a localized area usually over a bony prominence.  Wound Description (Comments):   Present on Admission: Yes       Macrocytic anemia Anemia panel with folic acid deficiency, mild iron deficiency. -Start her on folic acid supplement  Edema of left upper extremity Patient developed worsening left upper extremity edema. -Venous Doppler studies to rule out DVT    Consultants: Infectious disease Procedures performed: None Disposition: Home Diet recommendation:  Discharge Diet Orders (From admission, onward)     Start     Ordered   08/26/21 0000  Diet - low sodium heart healthy        08/26/21 1240           Cardiac and Carb modified diet  DISCHARGE MEDICATION: Allergies as of 08/26/2021       Reactions   Sulfa Antibiotics Swelling   Lip swelling        Medication List     STOP taking these medications    amoxicillin 500 MG capsule Commonly known as: AMOXIL   benzonatate 100 MG capsule Commonly known as: Tessalon Perles   loratadine 10 MG tablet Commonly known as: CLARITIN   multivitamin with minerals Tabs tablet   PRESCRIPTION MEDICATION       TAKE these medications    amantadine 100 MG capsule Commonly known as: SYMMETREL Take 100 mg by mouth daily as needed.   ARTIFICIAL TEARS OP Place 1 drop into both eyes 4 (four) times daily as needed (for dry eyes).   atropine 1 % ophthalmic solution Place 1 drop under the tongue at bedtime.   budesonide 32 MCG/ACT nasal spray Commonly known as: RHINOCORT AQUA USE 1 PUFF IN EACH NOSTRIL EVERY DAY AS NEEDED What changed: See the new instructions.   busPIRone 7.5 MG tablet Commonly known as: BUSPAR Take 7.5 mg by mouth 2 (two) times daily. To 3 times daily   celecoxib 100 MG  capsule Commonly known as: CELEBREX Take 100 mg by mouth 2 (two) times daily.   cholecalciferol 1000 units tablet Commonly known as: VITAMIN D Take 1,000 Units by mouth daily.   enalapril 2.5 MG tablet Commonly known as: VASOTEC Take 1 tablet (2.5 mg total) by mouth 2 (two) times daily.   fexofenadine 180 MG tablet Commonly known as: ALLEGRA Take 180 mg by mouth daily.   folic acid 1 MG tablet Commonly known as: FOLVITE Take 1 tablet (1 mg total) by mouth daily.   gabapentin 100 MG capsule Commonly known as: NEURONTIN Take 200 mg by mouth 3 (three) times daily.   glucose blood test strip Commonly known as: Quintet AC Blood Glucose Test Use as instructed   hydrocortisone 2.5 % lotion Apply to the eyebrows, mid face, and beard area QHS on Tuesday, Thursday, and Saturday.   ketoconazole 2 % cream Commonly known as: NIZORAL Apply 1 application topically 2 (two) times daily.   metFORMIN 500 MG tablet Commonly known as: GLUCOPHAGE TAKE 1 TABLET BY MOUTH 2 TIMES DAILY WITH A MEAL.   mometasone 0.1 % cream Commonly known as: ELOCON Apply to scaly rash area on neck once to twice daily as needed. Avoid the face, groin, and axilla.   polyethylene glycol 17 g packet Commonly known as: MIRALAX / GLYCOLAX Take 17 g by mouth daily  as needed.   pramipexole 0.5 MG tablet Commonly known as: MIRAPEX Take 2 tablets (1 mg total) by mouth at bedtime.   QUEtiapine 25 MG tablet Commonly known as: SEROQUEL Take 25 mg by mouth at bedtime.   Rytary 23.75-95 MG Cpcr Generic drug: Carbidopa-Levodopa ER Take 2 capsules by mouth 2 (two) times daily.   Rytary 61.25-245 MG Cpcr Generic drug: Carbidopa-Levodopa ER Take by mouth.   sertraline 25 MG tablet Commonly known as: ZOLOFT Take 25 mg by mouth daily.   vitamin B-12 1000 MCG tablet Commonly known as: CYANOCOBALAMIN Take 1,000 mcg by mouth daily.   vitamin C 500 MG tablet Commonly known as: ASCORBIC ACID Take 1,000 mg by  mouth 2 (two) times daily.   vitamin E 180 MG (400 UNITS) capsule Take 400 Units by mouth daily.   zinc sulfate 220 (50 Zn) MG capsule Take 1 capsule (220 mg total) by mouth daily.               Durable Medical Equipment  (From admission, onward)           Start     Ordered   08/26/21 1237  For home use only DME Other see comment  Once       Comments: Harrel Lemon Lift  Question:  Length of Need  Answer:  Lifetime   08/26/21 1237              Discharge Care Instructions  (From admission, onward)           Start     Ordered   08/26/21 0000  No dressing needed        08/26/21 1240            Follow-up Information     [name]., MD. Schedule an appointment as soon as possible for a visit in 1 week(s).   Specialty: Family Medicine Contact information: [address] [place] [address] [phone]                 Discharge Exam: Danley Danker Weights   08/22/21 1135 08/22/21 1136 08/22/21 2100  Weight: 99.8 kg 81.6 kg 93.4 kg   General.  Chronically ill-appearing elderly man, in no acute distress. Pulmonary.  Lungs clear bilaterally, normal respiratory effort. CV.  Regular rate and rhythm, no JVD, rub or murmur. Abdomen.  Soft, nontender, nondistended, BS positive. CNS.  Alert .  No apparent focal neurologic deficit. Extremities. UE edema, no cyanosis, pulses intact and symmetrical. Psychiatry.  Judgment and insight appears impaired.  Condition at discharge: stable  The results of significant diagnostics from this hospitalization (including imaging, microbiology, ancillary and laboratory) are listed below for reference.   Imaging Studies: US RENAL  Result Date: 08/26/2021 CLINICAL DATA:  Hydronephrosis EXAM: RENAL / URINARY TRACT ULTRASOUND COMPLETE COMPARISON:  None. FINDINGS: Right Kidney: Renal measurements: 18.9 x 5.5 x 5.3 cm = volume: 287 mL. Echogenicity within normal limits. No mass or hydronephrosis  visualized. Multiple benign simple exophytic cysts. Left Kidney: Renal measurements: 13.6 x 5.7 x 5.3 cm = volume: 215 mL. Echogenicity within normal limits. No mass or hydronephrosis visualized. Benign, simple exophytic cyst. Bladder: Decompressed by suprapubic catheter. Other: None. IMPRESSION: 1.  No hydronephrosis. 2.  Suprapubic catheter. Electronically Signed   By: Delanna Ahmadi M.D.   On: 08/26/2021 09:36   US Venous Img Upper Bilat (DVT)  Result Date: 08/25/2021 CLINICAL DATA:  Upper extremity edema worse on the left 1 day EXAM: BILATERAL  UPPER EXTREMITY VENOUS DOPPLER ULTRASOUND TECHNIQUE: Gray-scale sonography with graded compression, as well as color Doppler and duplex ultrasound were performed to evaluate the bilateral upper extremity deep venous systems from the level of the subclavian vein and including the jugular, axillary, basilic, radial, ulnar and upper cephalic vein. Spectral Doppler was utilized to evaluate flow at rest and with distal augmentation maneuvers. COMPARISON:  None. FINDINGS: RIGHT UPPER EXTREMITY Internal Jugular Vein: No evidence of thrombus. Normal compressibility, respiratory phasicity and response to augmentation. Subclavian Vein: No evidence of thrombus. Normal compressibility, respiratory phasicity and response to augmentation. Axillary Vein: No evidence of thrombus. Normal compressibility, respiratory phasicity and response to augmentation. Cephalic Vein: No evidence of thrombus. Normal compressibility, respiratory phasicity and response to augmentation. Basilic Vein: Upper arm mid right basilic vein demonstrates intraluminal thrombus and is noncompressible where there is a peripheral IV compatible with superficial thrombosis/thrombophlebitis. No propagation into the deep veins. Brachial Veins: No evidence of thrombus. Normal compressibility, respiratory phasicity and response to augmentation. Radial Veins: No evidence of thrombus. Normal compressibility, respiratory  phasicity and response to augmentation. Ulnar Veins: No evidence of thrombus. Normal compressibility, respiratory phasicity and response to augmentation. LEFT UPPER EXTREMITY Internal Jugular Vein: No evidence of thrombus. Normal compressibility, respiratory phasicity and response to augmentation. Subclavian Vein: No evidence of thrombus. Normal compressibility, respiratory phasicity and response to augmentation. Axillary Vein: No evidence of thrombus. Normal compressibility, respiratory phasicity and response to augmentation. Cephalic Vein: No evidence of thrombus. Normal compressibility, respiratory phasicity and response to augmentation. Basilic Vein: No evidence of thrombus. Normal compressibility, respiratory phasicity and response to augmentation. Brachial Veins: No evidence of thrombus. Normal compressibility, respiratory phasicity and response to augmentation. Radial Veins: No evidence of thrombus. Normal compressibility, respiratory phasicity and response to augmentation. Ulnar Veins: No evidence of thrombus. Normal compressibility, respiratory phasicity and response to augmentation. IMPRESSION: Right upper arm mid basilic vein localized superficial thrombosis/thrombophlebitis at a peripheral IV site. Very minor thrombus burden. No propagation into the deep veins. Negative for upper extremity significant DVT bilaterally. Electronically Signed   By: Jerilynn Mages.  Shick M.D.   On: 08/25/2021 17:27   DG Chest Port 1 View  Result Date: 08/22/2021 CLINICAL DATA:  Questionable sepsis. Evaluate for abnormality. History of diabetes and hypertension. EXAM: PORTABLE CHEST 1 VIEW COMPARISON:  Radiographs 06/19/2021 and 10/15/2017. FINDINGS: 1307 hours. Persistent low lung volumes with patchy bibasilar pulmonary opacities, probably reflecting atelectasis. There is no confluent airspace opacity, edema, significant pleural effusion or pneumothorax. The heart size and mediastinal contours are stable. No acute osseous findings  are identified. 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 Telemetry leads overlie the chest. IMPRESSION: Persistent low lung volumes with probable patchy bibasilar atelectasis, similar to priors. No definite acute findings. Electronically Signed   By: Richardean Sale M.D.   On: 08/22/2021 13:22    Microbiology: Results for orders placed or performed during the hospital encounter of 08/22/21  Resp Panel by RT-PCR (Flu A&B, Covid) Nasopharyngeal Swab     Status: None   Collection Time: 08/22/21  1:04 PM   Specimen: Nasopharyngeal Swab; Nasopharyngeal(NP) swabs in vial transport medium  Result Value Ref Range Status   SARS Coronavirus 2 by RT PCR NEGATIVE NEGATIVE Final    Comment: (NOTE) SARS-CoV-2 target nucleic acids are NOT DETECTED.  The SARS-CoV-2 RNA is generally detectable in upper respiratory specimens during the acute phase of infection. The lowest concentration of SARS-CoV-2 viral copies this assay can detect is  138 copies/mL. A negative result does not preclude SARS-Cov-2 infection and should not be used as the sole basis for treatment or other patient management decisions. A negative result may occur with  improper specimen collection/handling, submission of specimen other than nasopharyngeal swab, presence of viral mutation(s) within the areas targeted by this assay, and inadequate number of viral copies(<138 copies/mL). A negative result must be combined with clinical observations, patient history, and epidemiological information. The expected result is Negative.  Fact Sheet for Patients:  EntrepreneurPulse.com.au  Fact Sheet for Healthcare Providers:  IncredibleEmployment.be  This test is no t yet approved or cleared by the [place] FDA and  has been authorized for detection and/or diagnosis of SARS-CoV-2 by FDA under an Emergency Use Authorization (EUA). This EUA will remain   in effect (meaning this test can be used) for the duration of the COVID-19 declaration under Section 564(b)(1) of the Act, 21 U.S.C.section 360bbb-3(b)(1), unless the authorization is terminated  or revoked sooner.       Influenza A by PCR NEGATIVE NEGATIVE Final   Influenza B by PCR NEGATIVE NEGATIVE Final    Comment: (NOTE) The Xpert Xpress SARS-CoV-2/FLU/RSV plus assay is intended as an aid in the diagnosis of influenza from Nasopharyngeal swab specimens and should not be used as a sole basis for treatment. Nasal washings and aspirates are unacceptable for Xpert Xpress SARS-CoV-2/FLU/RSV testing.  Fact Sheet for Patients: EntrepreneurPulse.com.au  Fact Sheet for Healthcare Providers: IncredibleEmployment.be  This test is not yet approved or cleared by the [place] FDA and has been authorized for detection and/or diagnosis of SARS-CoV-2 by FDA under an Emergency Use Authorization (EUA). This EUA will remain in effect (meaning this test can be used) for the duration of the COVID-19 declaration under Section 564(b)(1) of the Act, 21 U.S.C. section 360bbb-3(b)(1), unless the authorization is terminated or revoked.  Performed at [facility], [address] [place], [place] [address]   Blood Culture (routine x 2)     Status: Abnormal   Collection Time: 08/22/21  1:04 PM   Specimen: BLOOD  Result Value Ref Range Status   Specimen Description   Final    BLOOD RIGHT ANTECUBITAL Performed at [facility], [address] [place], [place] [address]    Special Requests   Final    BOTTLES DRAWN AEROBIC AND ANAEROBIC Blood Culture adequate volume Performed at [facility], [address] [place], [place] [address]    Culture  Setup Time   Final    GRAM POSITIVE COCCI AEROBIC BOTTLE ONLY CRITICAL RESULT CALLED TO, READ BACK BY AND VERIFIED WITH: Herbert Pun AT [place] 08/23/21.PMF Performed at [facility], [place] [address], [place], [place] [address]    Culture STAPHYLOCOCCUS HOMINIS (A)  Final   Report Status 08/25/2021 FINAL  Final   Organism ID, Bacteria STAPHYLOCOCCUS HOMINIS  Final      Susceptibility   Staphylococcus hominis - MIC*    CIPROFLOXACIN <=0.5 SENSITIVE Sensitive     ERYTHROMYCIN >=8 RESISTANT Resistant     GENTAMICIN <=0.5 SENSITIVE Sensitive     OXACILLIN >=4 RESISTANT Resistant     TETRACYCLINE 2 SENSITIVE Sensitive     VANCOMYCIN <=0.5 SENSITIVE Sensitive     TRIMETH/SULFA 80 RESISTANT Resistant     CLINDAMYCIN <=0.25 SENSITIVE Sensitive     RIFAMPIN <=0.5 SENSITIVE Sensitive     Inducible Clindamycin NEGATIVE Sensitive     * STAPHYLOCOCCUS HOMINIS  Urine Culture     Status: Abnormal (  Preliminary result)   Collection Time: 08/22/21  1:04 PM   Specimen: In/Out Cath Urine  Result Value Ref Range Status   Specimen Description IN/OUT CATH URINE  Final   Special Requests NONE  Final   Culture (A)  Final    >=100,000 COLONIES/mL GRAM NEGATIVE RODS >=100,000 COLONIES/mL PROVIDENCIA RETTGERI IDENTIFICATION AND SUSCEPTIBILITIES TO FOLLOW    Report Status PENDING  Incomplete   Organism ID, Bacteria PROVIDENCIA RETTGERI (A)  Final      Susceptibility   Providencia rettgeri - MIC*    AMPICILLIN RESISTANT Resistant     CEFAZOLIN >=64 RESISTANT Resistant     CEFEPIME 0.5 SENSITIVE Sensitive     CEFTRIAXONE <=0.25 SENSITIVE Sensitive     CIPROFLOXACIN <=0.25 SENSITIVE Sensitive     GENTAMICIN <=1 SENSITIVE Sensitive     IMIPENEM 1 SENSITIVE Sensitive     NITROFURANTOIN 256 RESISTANT Resistant     TRIMETH/SULFA <=20 SENSITIVE Sensitive     AMPICILLIN/SULBACTAM >=32 RESISTANT Resistant     PIP/TAZO Value in next row Sensitive      <=4 SENSITIVEPerformed at [facility], [address], [place], [place] [address]    * >=100,000 COLONIES/mL PROVIDENCIA RETTGERI  Blood Culture ID Panel (Reflexed)     Status: Abnormal   Collection Time: 08/22/21  1:04 PM   Result Value Ref Range Status   Enterococcus faecalis NOT DETECTED NOT DETECTED Final   Enterococcus Faecium NOT DETECTED NOT DETECTED Final   Listeria monocytogenes NOT DETECTED NOT DETECTED Final   Staphylococcus species DETECTED (A) NOT DETECTED Final    Comment: CRITICAL RESULT CALLED TO, READ BACK BY AND VERIFIED WITH: Herbert Pun AT 0710 08/23/21.PMF    Staphylococcus aureus (BCID) NOT DETECTED NOT DETECTED Final   Staphylococcus epidermidis NOT DETECTED NOT DETECTED Final   Staphylococcus lugdunensis NOT DETECTED NOT DETECTED Final   Streptococcus species NOT DETECTED NOT DETECTED Final   Streptococcus agalactiae NOT DETECTED NOT DETECTED Final   Streptococcus pneumoniae NOT DETECTED NOT DETECTED Final   Streptococcus pyogenes NOT DETECTED NOT DETECTED Final   A.calcoaceticus-baumannii NOT DETECTED NOT DETECTED Final   Bacteroides fragilis NOT DETECTED NOT DETECTED Final   Enterobacterales NOT DETECTED NOT DETECTED Final   Enterobacter cloacae complex NOT DETECTED NOT DETECTED Final   Escherichia coli NOT DETECTED NOT DETECTED Final   Klebsiella aerogenes NOT DETECTED NOT DETECTED Final   Klebsiella oxytoca NOT DETECTED NOT DETECTED Final   Klebsiella pneumoniae NOT DETECTED NOT DETECTED Final   Proteus species NOT DETECTED NOT DETECTED Final   Salmonella species NOT DETECTED NOT DETECTED Final   Serratia marcescens NOT DETECTED NOT DETECTED Final   Haemophilus influenzae NOT DETECTED NOT DETECTED Final   Neisseria meningitidis NOT DETECTED NOT DETECTED Final   Pseudomonas aeruginosa NOT DETECTED NOT DETECTED Final   Stenotrophomonas maltophilia NOT DETECTED NOT DETECTED Final   Candida albicans NOT DETECTED NOT DETECTED Final   Candida auris NOT DETECTED NOT DETECTED Final   Candida glabrata NOT DETECTED NOT DETECTED Final   Candida krusei NOT DETECTED NOT DETECTED Final   Candida parapsilosis NOT DETECTED NOT DETECTED Final   Candida tropicalis NOT DETECTED NOT  DETECTED Final   Cryptococcus neoformans/gattii NOT DETECTED NOT DETECTED Final    Comment: Performed at [facility], [address] [place], [place] [address]  Blood Culture (routine x 2)     Status: Abnormal   Collection Time: 08/22/21  1:08 PM   Specimen: BLOOD  Result Value Ref Range Status   Specimen  Description   Final    BLOOD BLOOD RIGHT FOREARM Performed at [facility], [address] [place], [place] [address]    Special Requests   Final    BOTTLES DRAWN AEROBIC AND ANAEROBIC Blood Culture adequate volume Performed at [name], [address] [place], [place] [address]    Culture  Setup Time   Final    GRAM POSITIVE COCCI AEROBIC BOTTLE ONLY CRITICAL RESULT CALLED TO, READ BACK BY AND VERIFIED WITH: Herbert Pun AT [place] 08/23/21.PMF Performed at [facility], [address] [place], [place] [address]    Culture (A)  Final    STAPHYLOCOCCUS HOMINIS SUSCEPTIBILITIES PERFORMED ON PREVIOUS CULTURE WITHIN THE LAST 5 DAYS. Performed at [facility], [place] [address], [place], [place] [address]    Report Status 08/25/2021 FINAL  Final  CULTURE, BLOOD (ROUTINE X 2) w Reflex to ID Panel     Status: None (Preliminary result)   Collection Time: 08/24/21 12:38 PM   Specimen: BLOOD  Result Value Ref Range Status   Specimen Description BLOOD [facility]  Final   Special Requests BOTTLES DRAWN AEROBIC AND ANAEROBIC BCAV  Final   Culture   Final    NO GROWTH 2 DAYS Performed at [facility], [address] [place], [place] [address]    Report Status PENDING  Incomplete  CULTURE, BLOOD (ROUTINE X 2) w Reflex to ID Panel     Status: None (Preliminary result)   Collection Time: 08/24/21 12:49 PM   Specimen: BLOOD  Result Value Ref Range Status   Specimen Description BLOOD LAC  Final   Special Requests BOTTLES DRAWN AEROBIC AND ANAEROBIC BCAV  Final   Culture   Final    NO GROWTH 2 DAYS Performed at [facility],  [address] [place], [place] [address]    Report Status PENDING  Incomplete    Labs: CBC: Recent Labs  Lab 08/22/21 1304 08/22/21 1757 08/23/21 0518 08/24/21 0336  WBC 12.4* 9.5 10.3 8.7  NEUTROABS 9.6*  --  8.1*  --   HGB 13.0 11.4* 11.4* 12.3*  HCT 41.4 36.5* 36.1* 37.5*  MCV 101.7* 103.1* 99.4 97.9  PLT 208 171 162 371   Basic Metabolic Panel: Recent Labs  Lab 08/22/21 1304 08/22/21 1757 08/23/21 0518 08/24/21 0336 08/26/21 0405  NA 137  --  133* 136  --   K 4.6  --  4.1 3.7  --   CL 102  --  99 101  --   CO2 21*  --  28 27  --   GLUCOSE 132*  --  130* 126*  --   BUN 31*  --  30* 22  --   CREATININE 2.25* 2.15* 1.53* 0.98 0.85  CALCIUM 8.4*  --  8.3* 8.7*  --    Liver Function Tests: Recent Labs  Lab 08/22/21 1304 08/23/21 0518  AST 12* 9*  ALT <5 <5  ALKPHOS 61 56  BILITOT 1.1 0.8  PROT 7.3 6.6  ALBUMIN 3.4* 3.2*   CBG: Recent Labs  Lab 08/25/21 0818 08/25/21 1237 08/25/21 1737 08/25/21 2130 08/26/21 0819  GLUCAP 119* 125* 124* 154* 115*    Discharge time spent: greater than 30 minutes.  Signed: Lorella Nimrod, MD [facility] 08/26/2021

## 2021-08-26 NOTE — Telephone Encounter (Signed)
Requested medication (s) are due for refill today - yes  Requested medication (s) are on the active medication list -yes  Future visit scheduled -yes  Last refill: 07/28/21  Notes to clinic: Request RF: listed as historical provider, fails lab protocol-abnormal lab results  Requested Prescriptions  Pending Prescriptions Disp Refills   celecoxib (CELEBREX) 100 MG capsule [Pharmacy Med Name: CELECOXIB 100 MG CAPSULE] 60 capsule 2    Sig: TAKE 1 CAPSULE BY MOUTH TWICE A DAY     Analgesics:  COX2 Inhibitors Failed - 08/26/2021  1:45 AM      Failed - Manual Review: Labs are only required if the patient has taken medication for more than 8 weeks.      Failed - HGB in normal range and within 360 days    Hemoglobin  Date Value Ref Range Status  08/24/2021 12.3 (L) 13.0 - 17.0 g/dL Final  12/31/2020 14.8 13.0 - 17.7 g/dL Final          Failed - HCT in normal range and within 360 days    HCT  Date Value Ref Range Status  08/24/2021 37.5 (L) 39.0 - 52.0 % Final   Hematocrit  Date Value Ref Range Status  12/31/2020 42.5 37.5 - 51.0 % Final          Failed - AST in normal range and within 360 days    AST  Date Value Ref Range Status  08/23/2021 9 (L) 15 - 41 U/L Final          Passed - Cr in normal range and within 360 days    Creatinine  Date Value Ref Range Status  04/06/2014 0.94 0.60 - 1.30 mg/dL Final   Creatinine, Ser  Date Value Ref Range Status  08/26/2021 0.85 0.61 - 1.24 mg/dL Final          Passed - ALT in normal range and within 360 days    ALT  Date Value Ref Range Status  08/23/2021 <5 0 - 44 U/L Final          Passed - eGFR is 30 or above and within 360 days    EGFR (African American)  Date Value Ref Range Status  04/06/2014 >60 >11mL/min Final  04/04/2014 >60  Final   GFR calc Af Amer  Date Value Ref Range Status  08/15/2018 78 >59 mL/min/1.73 Final   EGFR (Non-African Amer.)  Date Value Ref Range Status  04/06/2014 >60 >71mL/min Final     Comment:    eGFR values <66mL/min/1.73 m2 may be an indication of chronic kidney disease (CKD). Calculated eGFR, using the MRDR Study equation, is useful in  patients with stable renal function. The eGFR calculation will not be reliable in acutely ill patients when serum creatinine is changing rapidly. It is not useful in patients on dialysis. The eGFR calculation may not be applicable to patients at the low and high extremes of body sizes, pregnant women, and vegetarians.   04/04/2014 54 (L)  Final    Comment:    eGFR values <53mL/min/1.73 m2 may be an indication of chronic kidney disease (CKD). Calculated eGFR is useful in patients with stable renal function. The eGFR calculation will not be reliable in acutely ill patients when serum creatinine is changing rapidly. It is not useful in  patients on dialysis. The eGFR calculation may not be applicable to patients at the low and high extremes of body sizes, pregnant women, and vegetarians.    GFR, Estimated  Date Value  Ref Range Status  08/26/2021 >60 >60 mL/min Final    Comment:    (NOTE) Calculated using the CKD-EPI Creatinine Equation (2021) Performed at [facility], [address] [place], [place] [address]    eGFR  Date Value Ref Range Status  12/31/2020 88 >59 mL/min/1.73 Final          Passed - Patient is not pregnant      Passed - Valid encounter within last 12 months    Recent Outpatient Visits           2 weeks ago Pressure injury of contiguous region involving back and buttock, stage 2, unspecified laterality [facility])   [facility] Tally Joe T, FNP   1 month ago Idiopathic Parkinson's disease [facility])   [place], [place], DO   3 months ago Diabetes mellitus without complication [facility])   [facility] [name]., MD   7 months ago Idiopathic Parkinson's disease [facility])   [facility] [name]., MD   1 year  ago Idiopathic Parkinson's disease [facility])   [facility] [name]., MD       Future Appointments             In 1 week McGowan, Gordan Payment [place]   In 1 week Mikey Kirschner, [place], [place]   In 3 months [name]., MD [facility]               Requested Prescriptions  Pending Prescriptions Disp Refills   celecoxib (CELEBREX) 100 MG capsule [Pharmacy Med Name: CELECOXIB 100 MG CAPSULE] 60 capsule 2    Sig: TAKE 1 CAPSULE BY MOUTH TWICE A DAY     Analgesics:  COX2 Inhibitors Failed - 08/26/2021  1:45 AM      Failed - Manual Review: Labs are only required if the patient has taken medication for more than 8 weeks.      Failed - HGB in normal range and within 360 days    Hemoglobin  Date Value Ref Range Status  08/24/2021 12.3 (L) 13.0 - 17.0 g/dL Final  12/31/2020 14.8 13.0 - 17.7 g/dL Final          Failed - HCT in normal range and within 360 days    HCT  Date Value Ref Range Status  08/24/2021 37.5 (L) 39.0 - 52.0 % Final   Hematocrit  Date Value Ref Range Status  12/31/2020 42.5 37.5 - 51.0 % Final          Failed - AST in normal range and within 360 days    AST  Date Value Ref Range Status  08/23/2021 9 (L) 15 - 41 U/L Final          Passed - Cr in normal range and within 360 days    Creatinine  Date Value Ref Range Status  04/06/2014 0.94 0.60 - 1.30 mg/dL Final   Creatinine, Ser  Date Value Ref Range Status  08/26/2021 0.85 0.61 - 1.24 mg/dL Final          Passed - ALT in normal range and within 360 days    ALT  Date Value Ref Range Status  08/23/2021 <5 0 - 44 U/L Final          Passed - eGFR is 30 or above and within 360 days    EGFR (African American)  Date Value Ref  Range Status  04/06/2014 >60 >32m/min Final  04/04/2014 >60  Final   GFR calc Af Amer  Date Value Ref Range Status  08/15/2018 78 >59 mL/min/1.73 Final    EGFR (Non-African Amer.)  Date Value Ref Range Status  04/06/2014 >60 >633mmin Final    Comment:    eGFR values <6076min/1.73 m2 may be an indication of chronic kidney disease (CKD). Calculated eGFR, using the MRDR Study equation, is useful in  patients with stable renal function. The eGFR calculation will not be reliable in acutely ill patients when serum creatinine is changing rapidly. It is not useful in patients on dialysis. The eGFR calculation may not be applicable to patients at the low and high extremes of body sizes, pregnant women, and vegetarians.   04/04/2014 54 (L)  Final    Comment:    eGFR values <52m31mn/1.73 m2 may be an indication of chronic kidney disease (CKD). Calculated eGFR is useful in patients with stable renal function. The eGFR calculation will not be reliable in acutely ill patients when serum creatinine is changing rapidly. It is not useful in  patients on dialysis. The eGFR calculation may not be applicable to patients at the low and high extremes of body sizes, pregnant women, and vegetarians.    GFR, Estimated  Date Value Ref Range Status  08/26/2021 >60 >60 mL/min Final    Comment:    (NOTE) Calculated using the CKD-EPI Creatinine Equation (2021) [facility] [id number]  Date Value Ref Range Status  12/31/2020 88 >59 mL/min/1.73 Final          Passed - Patient is not pregnant      Passed - Valid encounter within last 12 months    Recent Outpatient Visits           2 weeks ago Pressure injury of contiguous region involving back and buttock, stage 2, unspecified laterality [facility]   1 month ago [facility]'s [facility]   3 months ago [facility] mellitus without complication [facility]   7 months ago  [facility]'s [facility]   1 year ago Idiopathic Parkinson's disease ([facility]D       Future Appointments             In 1 week McGowan, ShanGordan PaymentlKinneyn 1 week DrubMikey Kirschner-CJeffersonvilleC [place] 3 months GilbJerrol BananaD [facility] [place]

## 2021-08-26 NOTE — Progress Notes (Signed)
Patient was c/o pain to his left arm. I brought Tylenol to the room for him and he refused he said, "That will not help my pain." I wasted the Tylenol. Messaged MD made her aware. Patient also stated that he did not feel well that he was feeling worse I let the MD know this as well. Patient still being discharged home. Caregiver at the bedside. Patient will be transported via EMS. Ivs removed.

## 2021-08-26 NOTE — Progress Notes (Addendum)
This nurse called EMS to check on pickup status. Transport stated that they were backed up with two more patients ahead of him so it would be awhile. Communicated this information to charge nurse Stanton Kidney who asked that I ensure the patient would have someone at home. His caregiver at bedside, [name] states the son lives next door, so he could let transport in. I called the son, Roger Rodriguez, who expressed his uneasiness with his father discharging home this late in the evening. Roger Rodriguez states, " I don't feel like it's safe and I want to know if there's anyway to keep him there until tomorrow, so he's not so thrown off." I communicated the son's wishes to charge nurse, Stanton Kidney who updated the [facility], Rob. Notified Neomia Glass, FNP of the above and she states "if his son is refusing him, we have no choice but to keep him until tomorrow." Updated all of the above parties of the decision to have patient remain overnight. Mary called EMS and cancelled transport for tonight.   Pt son requests that when EMS is called again tomorrow, that the caregiver, Helene Kelp, be called since he might be at work.   Teresa: [phone]

## 2021-08-26 NOTE — TOC Progression Note (Signed)
Transition of Care [facility]) - Progression Note    Patient Details  Name: Roger Rodriguez MRN: [id number] Date of Birth: 02-Jan-1941  Transition of Care [facility]) CM/SW Contact  Eileen Stanford, LCSW Phone Number: 08/26/2021, 2:15 PM  Clinical Narrative:   Pt is active with [facility]. They are notified of pt dc today. [place] list has been ordered through [employer]. EMS has been called and arranged.         Expected Discharge Plan and Services           Expected Discharge Date: 08/26/21                                     Social Determinants of Health (SDOH) Interventions    Readmission Risk Interventions No flowsheet data found.

## 2021-08-27 DIAGNOSIS — R339 Retention of urine, unspecified: Secondary | ICD-10-CM

## 2021-08-27 LAB — GLUCOSE, CAPILLARY: Glucose-Capillary: 138 mg/dL — ABNORMAL HIGH (ref 70–99)

## 2021-08-27 NOTE — Progress Notes (Signed)
Patient was to be discharged yesterday however due to transportation issues discharge was delayed until this morning.  Patient currently stable.  Vital signs stable.  Patient stable for discharge today.  No charge.

## 2021-08-28 ENCOUNTER — Ambulatory Visit: Payer: Self-pay

## 2021-08-28 ENCOUNTER — Telehealth: Payer: Self-pay

## 2021-08-28 NOTE — Telephone Encounter (Signed)
Transition Care Management Follow-up Telephone Call Date of discharge and from where: TCM [facility] 08-27-21 Dx: Sepsis How have you been since you were released from the hospital? Not doing too good  Any questions or concerns? No  Items Reviewed: Did the pt receive and understand the discharge instructions provided? Yes  Medications obtained and verified? Yes  Other? No  Any new allergies since your discharge? No  Dietary orders reviewed? Yes Do you have support at home? Yes   Home Care and Equipment/Supplies: Were home health services ordered? no If so, what is the name of the agency? na  Has the agency set up a time to come to the patient's home? not applicable Were any new equipment or medical supplies ordered?  Yes- hoyer lift  What is the name of the medical supply agency? Unknown  Were you able to get the supplies/equipment? yes Do you have any questions related to the use of the equipment or supplies? No  Functional Questionnaire: (I = Independent and D = Dependent) ADLs: D  Bathing/Dressing- D  Meal Prep- D  Eating- D  Maintaining continence- D  Transferring/Ambulation- D  Managing Meds- D  Follow up appointments reviewed:  PCP Hospital f/u appt confirmed? Yes  Scheduled to see Dr Thedore Mins  on 09-05-21 @ 220pm. [facility] f/u appt confirmed? Yes  Scheduled to see Dr Ernestine Conrad on 09-03-21 @ 130pm. Are transportation arrangements needed? No  If their condition worsens, is the pt aware to call PCP or go to the Emergency Dept.? Yes Was the patient provided with contact information for the PCP's office or ED? Yes Was to pt encouraged to call back with questions or concerns? Yes

## 2021-08-28 NOTE — Telephone Encounter (Signed)
Pts son Erline Levine called and stated that the pt has swelling of both wrist / the right wrist is worse / pt thinks it could be gout / pt was just discharged from the hospital and they advised it could be gout but told him to follow up with PCP to get tested for it / I scheduled appt for the time son asked for tomorrow / please advise    Chief Complaint: Both wrists swollen ,painful Symptoms: Pain, swelling Frequency: Started 2 weeks ago Pertinent Negatives: Patient denies fever Disposition: [] ED /[] Urgent Care (no appt availability in office) / [] Appointment(In office/virtual)/ []  [place] [facility]/ [] Home Care/ [] Refused Recommended Disposition /[]  Mobile Bus/ [x]  Follow-up with PCP Additional Notes: Appointment tomorrow.   Answer Assessment - Initial Assessment Questions 1. ONSET: "When did the swelling start?" (e.g., minutes, hours, days, weeks)     2 weeks ago 2. LOCATION: "What part of the wrist is swollen?"  "Are both wrists swollen or just one wrist?"     Both wrists 3. SEVERITY: "How bad is the swelling?"    - BALL OR LUMP: small ball or lump   - SKIN ONLY: localized; puffy or swollen area or patch of skin   - MILD JOINT SWELLING: joint feels or looks mildly swollen or puffy   - MODERATE JOINT SWELLING: moderate joint swelling; looks swollen   - SEVERE JOINT SWELLING:  severe joint swelling; can barely bend or move joint     Moderate 4. RECURRENT SYMPTOM: "Have you had wrist swelling before?" If Yes, ask: "When was the last time?" "What happened that time?"     Yes 5. CAUSE: "What do you think is causing the wrist swelling?" (e.g., arthritis, ganglion cyst, insect bite, recent injury)     Gout 6. OTHER SYMPTOMS: "Do you have any other symptoms?" (e.g., fever, hand pain)     Pain 7. PREGNANCY: "Is there any chance you are pregnant?" "When was your last menstrual period?"     N/a  Protocols used: Wrist Swelling-A-AH

## 2021-08-29 ENCOUNTER — Ambulatory Visit: Payer: PPO | Admitting: Physician Assistant

## 2021-08-29 DIAGNOSIS — Z9181 History of falling: Secondary | ICD-10-CM | POA: Diagnosis not present

## 2021-08-29 DIAGNOSIS — K760 Fatty (change of) liver, not elsewhere classified: Secondary | ICD-10-CM | POA: Diagnosis not present

## 2021-08-29 DIAGNOSIS — F321 Major depressive disorder, single episode, moderate: Secondary | ICD-10-CM | POA: Diagnosis not present

## 2021-08-29 DIAGNOSIS — M6281 Muscle weakness (generalized): Secondary | ICD-10-CM | POA: Diagnosis not present

## 2021-08-29 DIAGNOSIS — E119 Type 2 diabetes mellitus without complications: Secondary | ICD-10-CM | POA: Diagnosis not present

## 2021-08-29 DIAGNOSIS — E7800 Pure hypercholesterolemia, unspecified: Secondary | ICD-10-CM | POA: Diagnosis not present

## 2021-08-29 DIAGNOSIS — G20 Parkinson's disease: Secondary | ICD-10-CM | POA: Diagnosis not present

## 2021-08-29 DIAGNOSIS — U099 Post covid-19 condition, unspecified: Secondary | ICD-10-CM | POA: Diagnosis not present

## 2021-08-29 DIAGNOSIS — G2581 Restless legs syndrome: Secondary | ICD-10-CM | POA: Diagnosis not present

## 2021-08-29 DIAGNOSIS — I10 Essential (primary) hypertension: Secondary | ICD-10-CM | POA: Diagnosis not present

## 2021-08-29 DIAGNOSIS — Z7901 Long term (current) use of anticoagulants: Secondary | ICD-10-CM | POA: Diagnosis not present

## 2021-08-29 DIAGNOSIS — Z87440 Personal history of urinary (tract) infections: Secondary | ICD-10-CM | POA: Diagnosis not present

## 2021-08-29 LAB — CULTURE, BLOOD (ROUTINE X 2)
Culture: NO GROWTH
Culture: NO GROWTH

## 2021-09-01 DIAGNOSIS — L89612 Pressure ulcer of right heel, stage 2: Secondary | ICD-10-CM | POA: Diagnosis not present

## 2021-09-01 DIAGNOSIS — L8990 Pressure ulcer of unspecified site, unspecified stage: Secondary | ICD-10-CM | POA: Diagnosis not present

## 2021-09-01 DIAGNOSIS — R600 Localized edema: Secondary | ICD-10-CM | POA: Diagnosis not present

## 2021-09-01 DIAGNOSIS — G20 Parkinson's disease: Secondary | ICD-10-CM | POA: Diagnosis not present

## 2021-09-02 ENCOUNTER — Ambulatory Visit: Payer: Self-pay | Admitting: *Deleted

## 2021-09-02 ENCOUNTER — Ambulatory Visit: Payer: PPO | Admitting: Urology

## 2021-09-02 NOTE — Telephone Encounter (Signed)
Patient's caregiver was advised.

## 2021-09-02 NOTE — Telephone Encounter (Signed)
I  returned pt's call to care giver Helene Kelp.  Pt is having severe constipation.  Not responsive to medication.   Reason for Disposition  Last bowel movement (BM) > 4 days ago    In hospital 2/10-2/15, 2023.  Doesn't ambulate.  Answer Assessment - Initial Assessment Questions 1. STOOL PATTERN OR FREQUENCY: "How often do you have a bowel movement (BM)?"  (Normal range: 3 times a day to every 3 days)  "When was your last BM?"       [facility] answered.   He hasn't had a BM since last Tuesday.    Gave him Miralax yesterday and this morning.    He usually goes daily.     His abd is hard and swollen.    2. STRAINING: "Do you have to strain to have a BM?"      Yes 3. RECTAL PAIN: "Does your rectum hurt when the stool comes out?" If Yes, ask: "Do you have hemorrhoids? How bad is the pain?"  (Scale 1-10; or mild, moderate, severe)     Transportation coming Friday to bring him into the office.    He doesn't walk.   He has been in the hospital and he gets constipated every time he is in the hospital.   He is so weak from having Covid but he is getting stronger.  He is drinking plenty of fluids and eating fine per Helene Kelp his care giver. 4. STOOL COMPOSITION: "Are the stools hard?"      When he has one they are. 5. BLOOD ON STOOLS: "Has there been any blood on the toilet tissue or on the surface of the BM?" If Yes, ask: "When was the last time?"      o 6. CHRONIC CONSTIPATION: "Is this a new problem for you?"  If no, ask: "How long have you had this problem?" (days, weeks, months)      No He gets constipated every time he is in the hospital.   He was just in recently. 7. CHANGES IN DIET OR HYDRATION: "Have there been any recent changes in your diet?" "How much fluids are you drinking on a daily basis?"  "How much have you had to drink today?"     Been in the hospital.   8. MEDICATIONS: "Have you been taking any new medications?" "Are you taking any narcotic pain medications?" (e.g., Vicodin, Percocet, morphine,  Dilaudid)     Not asked 9. LAXATIVES: "Have you been using any stool softeners, laxatives, or enemas?"  If yes, ask "What, how often, and when was the last time?"     Given Miralax yesterday and this morning that's all.   10. ACTIVITY:  "How much walking do you do every day?"  "Has your activity level decreased in the past week?"        He doesn't walk due to being so weak since having Covid. 11. CAUSE: "What do you think is causing the constipation?"        Not asked 12. OTHER SYMPTOMS: "Do you have any other symptoms?" (e.g., abdominal pain, bloating, fever, vomiting)       Abd is swollen 13. MEDICAL HISTORY: "Do you have a history of hemorrhoids, rectal fissures, or rectal surgery or rectal abscess?"         Not asked 14. PREGNANCY: "Is there any chance you are pregnant?" "When was your last menstrual period?"       N/A  Protocols used: Constipation-A-AH

## 2021-09-02 NOTE — Progress Notes (Deleted)
Suprapubic Cath Change  Patient is present today for a suprapubic catheter change due to urinary retention.  ***ml of water was drained from the balloon, a ***FR foley cath was removed from the tract with out difficulty.  Site was cleaned and prepped in a sterile fashion with betadine.  A ***FR foley cath was replaced into the tract {dnt complications:20057}. Urine return was noted, 10 ml of sterile water was inflated into the balloon and a *** bag was attached for drainage.  Patient tolerated well. A night bag was given to patient and proper instruction was given on how to switch bags.    Performed by: ***  Follow up: Patient was recently hospitalized for sepsis.  RUS 08/2021 no hydronephrosis and with multiple benign renal cysts.

## 2021-09-02 NOTE — Telephone Encounter (Signed)
°  Chief Complaint: constipation  Recently in the hospital. Symptoms: constipated since last Tuesday, abd swollen Frequency: usually goes daily Pertinent Negatives: Patient denies  Disposition: [] ED /[] Urgent Care (no appt availability in office) / [] Appointment(In office/virtual)/ []  [place] Virtual [facility]/ [] Home Care/ [] Refused Recommended Disposition /[] [place] Mobile Bus/ []  Follow-up with PCP Additional Notes: Message sent to Dr. Rosanna Randy at [facility].   Transportation Services has to bring him for appts because he doesn't walk due to weakness from having Covid a while back.   He does not ambulate per Helene Kelp, his care giver.   Pt does have an appt this Friday with Dr. Rosanna Randy.     Care givers are needing advice what to do for the constipation.   Continue giving the Miralax or try something else?   Pt can be reached at [phone].   One of his care givers will probably answer the phone.  [name] will be coming on duty this evening.

## 2021-09-03 ENCOUNTER — Ambulatory Visit: Payer: PPO | Admitting: Urology

## 2021-09-03 ENCOUNTER — Encounter: Payer: Self-pay | Admitting: Urology

## 2021-09-03 DIAGNOSIS — Z435 Encounter for attention to cystostomy: Secondary | ICD-10-CM

## 2021-09-05 ENCOUNTER — Other Ambulatory Visit: Payer: Self-pay

## 2021-09-05 ENCOUNTER — Encounter: Payer: Self-pay | Admitting: Physician Assistant

## 2021-09-05 ENCOUNTER — Ambulatory Visit (INDEPENDENT_AMBULATORY_CARE_PROVIDER_SITE_OTHER): Payer: PPO | Admitting: Physician Assistant

## 2021-09-05 VITALS — BP 129/71 | HR 76 | Ht 72.0 in | Wt 220.0 lb

## 2021-09-05 DIAGNOSIS — D520 Dietary folate deficiency anemia: Secondary | ICD-10-CM

## 2021-09-05 DIAGNOSIS — I10 Essential (primary) hypertension: Secondary | ICD-10-CM

## 2021-09-05 DIAGNOSIS — F321 Major depressive disorder, single episode, moderate: Secondary | ICD-10-CM | POA: Diagnosis not present

## 2021-09-05 DIAGNOSIS — G20 Parkinson's disease: Secondary | ICD-10-CM | POA: Diagnosis not present

## 2021-09-05 DIAGNOSIS — Z9359 Other cystostomy status: Secondary | ICD-10-CM | POA: Diagnosis not present

## 2021-09-05 DIAGNOSIS — R197 Diarrhea, unspecified: Secondary | ICD-10-CM | POA: Diagnosis not present

## 2021-09-05 DIAGNOSIS — G20A1 Parkinson's disease without dyskinesia, without mention of fluctuations: Secondary | ICD-10-CM

## 2021-09-05 NOTE — Assessment & Plan Note (Signed)
D/c colace. Advised use miralax as needed.

## 2021-09-05 NOTE — Assessment & Plan Note (Addendum)
Advised pt follow with urology, he was a no show to last appt Regular catheter checks w/ uro and managed by home health--advised if any dec in output, cloudiness, to bring in urine sample here if they can't reach urology. Gave pt a few sterile urine containers.

## 2021-09-05 NOTE — Assessment & Plan Note (Signed)
Continue taking folic acid.  Will recheck folic acid levels and cbc in 8 weeks

## 2021-09-05 NOTE — Assessment & Plan Note (Signed)
BP normal in office, reported similar or lower at home.  Stay off enalapril and continue to monitor BP

## 2021-09-05 NOTE — Assessment & Plan Note (Signed)
Son filled out PHq9 and GAD 7 for pt will not include.  Discussed zoloft w/ pt, prefers to stay off medication for now.  Advised of benefits.  Will monitor

## 2021-09-05 NOTE — Progress Notes (Signed)
I,Sha'taria Tyson,acting as a Education administrator for [employer], PA-C.,have documented all relevant documentation on the behalf of Mikey Kirschner, PA-C,as directed by  Mikey Kirschner, PA-C while in the presence of Mikey Kirschner, PA-C.  Established Patient Office Visit  Subjective:  Patient ID: Roger Rodriguez, male    DOB: 1941-03-22  Age: 81 y.o. MRN: [id number]  CC: hospital follow up  HPI Roger Rodriguez presents for hospital follow up.. Follow up Hospitalization  Patient was admitted to [facility] on 08/22/21 and discharged on 08/27/2021. He was treated for Sepsis d/t complicated UTI w/ AKI. Treatment for this included IV abx. Telephone follow up was done on 08/28/21 He reports this condition is improved. Any questions? Questions about medications -- should he restart BP medication ? Which carbidopa-levodopa should he take? Should he be taking Zoloft?  Questions about wrist swelling in early Feb, resolved but was edematous, erythematous and very tender to touch.  Reports increase in diarrhea, more difficult time controlling bowels post hospital. Reports taking colace and miralax.   ----------------------------------------------------------------------------------------- -   Past Medical History:  Diagnosis Date   Allergy    allergic rhinitis   Arthritis    OA   Chronic kidney disease    stones   Depression    Diabetes mellitus without complication (HCC)    Family history of adverse reaction to anesthesia    Hematuria    History of kidney stones    Hyperlipidemia    Hypertension    no meds since parkinsons meds started   Incontinence of urine    Inguinal hernia    Neuromuscular disorder ([place])    parkinsons   Overactive bladder    Parkinson disease ([place])    Parkinson's disease ([place])    Prostate cancer ([place])    Rhinitis, allergic    Shortness of breath dyspnea    doe secondary to parkinsons    Past Surgical History:  Procedure Laterality Date   BACK SURGERY  2014    CYSTOSCOPY WITH STENT PLACEMENT Right 01/13/2016   Procedure: CYSTOSCOPY WITH STENT PLACEMENT;  Surgeon: Hollice Espy, MD;  Location: [facility] OR[facility];  Service: Urology;  Laterality: Right;   CYSTOSCOPY/URETEROSCOPY/HOLMIUM LASER/STENT PLACEMENT Left 11/20/2015   Procedure: CYSTOSCOPY/URETEROSCOPY/HOLMIUM LASER/STENT PLACEMENT;  Surgeon: Hollice Espy, MD;  Location: [facility];  Service: Urology;  Laterality: Left;   EYE SURGERY     FRACTURE SURGERY      ankle    H/O arthroscopy of right knee  2015   H/O radical protatectomy     HERNIA REPAIR     JOINT REPLACEMENT     right knee   LITHOTRIPSY  2010   Rotator Cuff tear  1993   Repaired   TOTAL KNEE REVISION Right 03/17/2018   Procedure: TOTAL KNEE REVISION;  Surgeon: Lovell Sheehan, MD;  Location: [facility];  Service: Orthopedics;  Laterality: Right;   TOTAL KNEE REVISION Right 06/08/2018   Procedure: TOTAL KNEE REVISION;  Surgeon: Lovell Sheehan, MD;  Location: [facility];  Service: Orthopedics;  Laterality: Right;   URETEROSCOPY WITH HOLMIUM LASER LITHOTRIPSY  2002?   URETEROSCOPY WITH HOLMIUM LASER LITHOTRIPSY Right 01/13/2016   Procedure: URETEROSCOPY WITH HOLMIUM LASER LITHOTRIPSY;  Surgeon: Hollice Espy, MD;  Location: [facility];  Service: Urology;  Laterality: Right;   VASECTOMY     History    Family History  Problem Relation Age of Onset   Dementia Mother    Cerebral aneurysm Father    Bladder Cancer Paternal Uncle    Liver  cancer Sister    Prostate cancer Neg Hx    Kidney cancer Neg Hx     Social History   Socioeconomic History   Marital status: Widowed    Spouse name: Not on file   Number of children: 2   Years of education: Not on file   Highest education level: Some college, no degree  Occupational History   Occupation: retired  Tobacco Use   Smoking status: Former    Types: Cigars    Quit date: 11/11/1968    Years since quitting: 52.8   Smokeless tobacco: Former    Types: Chew    Quit date: 11/12/2003   Tobacco  comments:    Occassionally, also chewed tobacco till 2005. Quit smoking 1973  Vaping Use   Vaping Use: Never used  Substance and Sexual Activity   Alcohol use: No    Alcohol/week: 0.0 standard drinks   Drug use: No   Sexual activity: Not Currently  Other Topics Concern   Not on file  Social History Narrative   Not on file   Social Determinants of Health   Financial Resource Strain: Low Risk    Difficulty of Paying Living Expenses: Not hard at all  Food Insecurity: No Food Insecurity   Worried About Charity fundraiser in the Last Year: Never true   [place] in the Last Year: Never true  Transportation Needs: No Transportation Needs   Lack of Transportation (Medical): No   Lack of Transportation (Non-Medical): No  Physical Activity: Inactive   Days of Exercise per Week: 0 days   Minutes of Exercise per Session: 0 min  Stress: No Stress Concern Present   Feeling of Stress : Only a little  Social Connections: Socially Isolated   Frequency of Communication with Friends and Family: More than three times a week   Frequency of Social Gatherings with Friends and Family: More than three times a week   Attends Religious Services: Never   Marine scientist or Organizations: No   Attends Archivist Meetings: Never   Marital Status: Widowed  Human resources officer Violence: Not At Risk   Fear of Current or Ex-Partner: No   Emotionally Abused: No   Physically Abused: No   Sexually Abused: No    Outpatient Medications Prior to Visit  Medication Sig Dispense Refill   amantadine (SYMMETREL) 100 MG capsule Take 100 mg by mouth daily as needed.     atropine 1 % ophthalmic solution Place 1 drop under the tongue at bedtime.     budesonide (RHINOCORT AQUA) 32 MCG/ACT nasal spray USE 1 PUFF IN EACH NOSTRIL EVERY DAY AS NEEDED (Patient taking differently: Place 1 spray into both nostrils daily as needed for allergies.) 1 Bottle 5   busPIRone (BUSPAR) 7.5 MG tablet Take 7.5 mg  by mouth 2 (two) times daily. To 3 times daily     carbidopa-levodopa (SINEMET CR) 50-200 MG tablet Take 1 tablet by mouth at bedtime.     celecoxib (CELEBREX) 100 MG capsule TAKE 1 CAPSULE BY MOUTH TWICE A DAY 60 capsule 2   cholecalciferol (VITAMIN D) 1000 units tablet Take 1,000 Units by mouth daily.      fexofenadine (ALLEGRA) 180 MG tablet Take 180 mg by mouth daily.     folic acid (FOLVITE) 1 MG tablet Take 1 tablet (1 mg total) by mouth daily. 90 tablet 1   gabapentin (NEURONTIN) 100 MG capsule Take 200 mg by mouth 3 (three) times daily.  glucose blood (QUINTET AC BLOOD GLUCOSE TEST) test strip Use as instructed 100 each 12   hydrocortisone 2.5 % lotion Apply to the eyebrows, mid face, and beard area QHS on Tuesday, Thursday, and Saturday. 59 mL 6   Hypromellose (ARTIFICIAL TEARS OP) Place 1 drop into both eyes 4 (four) times daily as needed (for dry eyes).      ketoconazole (NIZORAL) 2 % cream Apply 1 application topically 2 (two) times daily. 30 g 1   metFORMIN (GLUCOPHAGE) 500 MG tablet TAKE 1 TABLET BY MOUTH 2 TIMES DAILY WITH A MEAL. 180 tablet 1   mometasone (ELOCON) 0.1 % cream Apply to scaly rash area on neck once to twice daily as needed. Avoid the face, groin, and axilla. 50 g 1   polyethylene glycol (MIRALAX / GLYCOLAX) packet Take 17 g by mouth daily as needed.      pramipexole (MIRAPEX) 0.5 MG tablet Take 2 tablets (1 mg total) by mouth at bedtime. 30 tablet 0   RYTARY 23.75-95 MG CPCR Take 2 capsules by mouth 2 (two) times daily.     RYTARY 61.25-245 MG CPCR Take by mouth.     vitamin B-12 (CYANOCOBALAMIN) 1000 MCG tablet Take 1,000 mcg by mouth daily.     vitamin C (ASCORBIC ACID) 500 MG tablet Take 1,000 mg by mouth 2 (two) times daily.     vitamin E 180 MG (400 UNITS) capsule Take 400 Units by mouth daily.      enalapril (VASOTEC) 2.5 MG tablet Take 1 tablet (2.5 mg total) by mouth 2 (two) times daily. (Patient not taking: Reported on 08/18/2021) 180 tablet 1    QUEtiapine (SEROQUEL) 25 MG tablet Take 25 mg by mouth at bedtime. (Patient not taking: Reported on 08/14/2021)     sertraline (ZOLOFT) 25 MG tablet Take 25 mg by mouth daily.     zinc sulfate 220 (50 Zn) MG capsule Take 1 capsule (220 mg total) by mouth daily. (Patient not taking: Reported on 08/14/2021) 30 capsule 0   No facility-administered medications prior to visit.    Allergies  Allergen Reactions   Sulfa Antibiotics Swelling    Lip swelling    ROS Review of Systems  Constitutional:  Positive for fatigue. Negative for fever.  Respiratory:  Negative for cough and shortness of breath.   Cardiovascular:  Negative for chest pain, palpitations and leg swelling.  Gastrointestinal:  Positive for diarrhea.  Neurological:  Negative for dizziness and headaches.  Psychiatric/Behavioral:  The patient is nervous/anxious.      Objective:    Physical Exam Constitutional:      Appearance: Normal appearance. He is not ill-appearing.  Eyes:     Conjunctiva/sclera: Conjunctivae normal.  Cardiovascular:     Rate and Rhythm: Normal rate and regular rhythm.     Pulses: Normal pulses.     Heart sounds: Normal heart sounds.  Pulmonary:     Effort: Pulmonary effort is normal.     Breath sounds: Normal breath sounds.  Musculoskeletal:     Right lower leg: No edema.     Left lower leg: No edema.  Skin:    General: Skin is warm.  Neurological:     Mental Status: He is oriented to person, place, and time.     Comments: Wheelchair bound, difficult time sitting up on own.   Psychiatric:        Behavior: Behavior normal.    BP 129/71 (BP Location: Left Arm, Patient Position: Sitting, Cuff Size: Normal)  Pulse 76    Ht 6' (1.829 m)    Wt 220 lb (99.8 kg)    SpO2 100%    BMI 29.84 kg/m  Wt Readings from Last 3 Encounters:  09/05/21 220 lb (99.8 kg)  08/22/21 205 lb 14.6 oz (93.4 kg)  07/09/21 165 lb (74.8 kg)     Health Maintenance Due  Topic Date Due   FOOT EXAM  Never done    OPHTHALMOLOGY EXAM  Never done    There are no preventive care reminders to display for this patient.  Lab Results  Component Value Date   TSH 1.970 12/31/2020   Lab Results  Component Value Date   WBC 8.7 08/24/2021   HGB 12.3 (L) 08/24/2021   HCT 37.5 (L) 08/24/2021   MCV 97.9 08/24/2021   PLT 179 08/24/2021   Lab Results  Component Value Date   NA 136 08/24/2021   K 3.7 08/24/2021   CO2 27 08/24/2021   GLUCOSE 126 (H) 08/24/2021   BUN 22 08/24/2021   CREATININE 0.85 08/26/2021   BILITOT 0.8 08/23/2021   ALKPHOS 56 08/23/2021   AST 9 (L) 08/23/2021   ALT <5 08/23/2021   PROT 6.6 08/23/2021   ALBUMIN 3.2 (L) 08/23/2021   CALCIUM 8.7 (L) 08/24/2021   ANIONGAP 8 08/24/2021   EGFR 88 12/31/2020   Lab Results  Component Value Date   CHOL 225 (H) 12/31/2020   Lab Results  Component Value Date   HDL 31 (L) 12/31/2020   Lab Results  Component Value Date   LDLCALC 156 (H) 12/31/2020   Lab Results  Component Value Date   TRIG 204 (H) 12/31/2020   Lab Results  Component Value Date   CHOLHDL 7.3 (H) 12/31/2020   Lab Results  Component Value Date   HGBA1C 7.1 (A) 05/28/2021      Assessment & Plan:   Problem List Items Addressed This Visit       Cardiovascular and Mediastinum   Essential (primary) hypertension    BP normal in office, reported similar or lower at home.  Stay off enalapril and continue to monitor BP        Nervous and Auditory   Parkinson's disease ([place]) - Primary    Advised to continue with the two prescriptions of Rytary QID as last prescribed by neuro. Unclear from neuro notes if he should be taking an additional dose at bedtime. Advised for now to stay off of that additional dose and follow up with neurology for more instructions        Other   Depression, major, single episode, moderate ([place])    Son filled out PHq9 and GAD 7 for pt will not include.  Discussed zoloft w/ pt, prefers to stay off medication for now.  Advised of  benefits.  Will monitor      Folate deficiency anemia    Continue taking folic acid.  Will recheck folic acid levels and cbc in 8 weeks      Diarrhea    D/c colace. Advised use miralax as needed.        Suprapubic catheter ([place])    Advised pt follow with urology, he was a no show to last appt Regular catheter checks w/ uro and managed by home health--advised if any dec in output, cloudiness, to bring in urine sample here if they can't reach urology. Gave pt a few sterile urine containers.          I, Mikey Kirschner, PA-C have reviewed  all documentation for this visit. The documentation on  09/05/2021  for the exam, diagnosis, procedures, and orders are all accurate and complete.   Mikey Kirschner, PA-C [facility] [address] [place], [place], [address] Office: [phone] Fax: [phone]

## 2021-09-05 NOTE — Assessment & Plan Note (Signed)
Advised to continue with the two prescriptions of Rytary QID as last prescribed by neuro. Unclear from neuro notes if he should be taking an additional dose at bedtime. Advised for now to stay off of that additional dose and follow up with neurology for more instructions

## 2021-09-09 ENCOUNTER — Telehealth: Payer: Self-pay

## 2021-09-09 ENCOUNTER — Other Ambulatory Visit: Payer: Self-pay

## 2021-09-09 DIAGNOSIS — R3989 Other symptoms and signs involving the genitourinary system: Secondary | ICD-10-CM | POA: Diagnosis not present

## 2021-09-09 NOTE — Telephone Encounter (Signed)
Copied from [place] [id number]. Topic: General - Other >> Sep 09, 2021  3:06 PM Pawlus, Brayton Layman A wrote: Reason for CRM: Pts son called in asking if he could drop off a urine sample from the pt,  pt stated he was given samples of cups to use if they believed the pt had a uti, please advise if pts son can drop this off today.

## 2021-09-11 ENCOUNTER — Telehealth: Payer: Self-pay | Admitting: Urology

## 2021-09-11 NOTE — Telephone Encounter (Signed)
Mailbox is full, cannot leave message

## 2021-09-11 NOTE — Telephone Encounter (Signed)
Roger Rodriguez, patient's home health nurse called the office today with concerns. ? ?Patient was recently hospitalized with sepsis.   ? ?Nurse states that there is blood around the SPT tube site and on the gauze covering the insertion site.  ? ?Nurse also states that the urine looks dark and cloudy. ? ?Patient states that he has not been feeling well.  ? ?They took a urine specimen to the PCP office and culture results are pending.  ? ?Please advise on how to proceed at this point. ?

## 2021-09-15 ENCOUNTER — Ambulatory Visit: Payer: PPO | Admitting: Urology

## 2021-09-15 ENCOUNTER — Telehealth: Payer: Self-pay

## 2021-09-15 ENCOUNTER — Other Ambulatory Visit: Payer: Self-pay

## 2021-09-15 ENCOUNTER — Encounter: Payer: Self-pay | Admitting: Urology

## 2021-09-15 VITALS — BP 109/63 | HR 55 | Ht 72.0 in

## 2021-09-15 DIAGNOSIS — T83090A Other mechanical complication of cystostomy catheter, initial encounter: Secondary | ICD-10-CM

## 2021-09-15 DIAGNOSIS — N390 Urinary tract infection, site not specified: Secondary | ICD-10-CM

## 2021-09-15 LAB — URINE CULTURE

## 2021-09-15 MED ORDER — CIPROFLOXACIN HCL 250 MG PO TABS: 250.0000 mg | ORAL_TABLET | Freq: Two times a day (BID) | ORAL | 0 refills | Status: DC

## 2021-09-15 NOTE — Telephone Encounter (Signed)
Copied from [place] [id number]. Topic: General - Inquiry >> Sep 15, 2021  9:58 AM Scherrie Gerlach wrote: Reason for CRM: Diann w/ [facility] is going to resend 3 orders for resumption of care .   Please be on the lookout as she sent them 2/23 and not gotten back.

## 2021-09-15 NOTE — Telephone Encounter (Signed)
Pt scheduled for OV today.  

## 2021-09-15 NOTE — Patient Instructions (Signed)
Vinegar Bladder Irrigation Protocol patient education  Patient's on intermittent catheterization with chronic bacteriuria and/or chronic bladder stones, irrigating the bladder with a dilute vinegar solution can be beneficial.  The recommended concentration is 0.25% acetic acid. Most grocery stores carry white vinegar as a 5% solution.  Therefore, to make appropriate bladder irrigations, it needs to be diluted at a ratio of roughly 20:1.  To achieve this, see the chart below to determine what amount of 5% white vinegar solution you should mix with your normal bladder irrigation (homemade saline or sterile sodium chloride from the pharmacy).  Amount of 5% White Vinegar Solution to Mix In: If Your Normal Bladder Irrigation Amount Is: 2.5 teaspoons (12.5 mL) 250 mL irrigation 5 teaspoons (25 mL) 500 mL irrigation 10 teaspoons (50 mL) 1000 mL irrigation About 6 ounces 1 gallon irrigation  It might be helpful to remember:  One teaspoon = 5 mL  One tablespoon = 15 mL  Irrigate the bladder with this solution using the volumes and techniques you've discussed with your health care provider. In certain circumstances, your provider might instruct you to leave some irrigation in the bladder for a period of time to dissolve debris/mucous. Discontinue irrigation if it causes pain or discomfort. Do not use this solution if you believe your child has an acute urinary tract infection.  

## 2021-09-16 ENCOUNTER — Other Ambulatory Visit: Payer: Self-pay | Admitting: *Deleted

## 2021-09-16 NOTE — Telephone Encounter (Signed)
Provider has completed and sent to medical records. ?

## 2021-09-18 DIAGNOSIS — M6281 Muscle weakness (generalized): Secondary | ICD-10-CM | POA: Diagnosis not present

## 2021-09-18 DIAGNOSIS — G2581 Restless legs syndrome: Secondary | ICD-10-CM | POA: Diagnosis not present

## 2021-09-18 DIAGNOSIS — Z9181 History of falling: Secondary | ICD-10-CM | POA: Diagnosis not present

## 2021-09-18 DIAGNOSIS — Z7901 Long term (current) use of anticoagulants: Secondary | ICD-10-CM | POA: Diagnosis not present

## 2021-09-18 DIAGNOSIS — U099 Post covid-19 condition, unspecified: Secondary | ICD-10-CM | POA: Diagnosis not present

## 2021-09-18 DIAGNOSIS — F321 Major depressive disorder, single episode, moderate: Secondary | ICD-10-CM | POA: Diagnosis not present

## 2021-09-18 DIAGNOSIS — K760 Fatty (change of) liver, not elsewhere classified: Secondary | ICD-10-CM | POA: Diagnosis not present

## 2021-09-18 DIAGNOSIS — E7800 Pure hypercholesterolemia, unspecified: Secondary | ICD-10-CM | POA: Diagnosis not present

## 2021-09-18 DIAGNOSIS — E119 Type 2 diabetes mellitus without complications: Secondary | ICD-10-CM | POA: Diagnosis not present

## 2021-09-18 DIAGNOSIS — G20 Parkinson's disease: Secondary | ICD-10-CM | POA: Diagnosis not present

## 2021-09-18 DIAGNOSIS — Z87440 Personal history of urinary (tract) infections: Secondary | ICD-10-CM | POA: Diagnosis not present

## 2021-09-18 DIAGNOSIS — I10 Essential (primary) hypertension: Secondary | ICD-10-CM | POA: Diagnosis not present

## 2021-09-24 NOTE — Progress Notes (Signed)
? ? ?09/15/2021 ?4:21 PM  ? ?Roger Rodriguez ?April 22, 1941 ?[id number] ? ?Referring provider: [name]., MD ?[address] ?[address] ?[place],  [place] [address] ? ?Chief Complaint  ?Patient presents with  ? Acute Visit  ?  SPT issues   ? ?Urological history: ?1. 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 ?2. Prostate cancer ?-s/p prostatectomy 2006 ? ?3. Incontinence ?-contributing factors of age, Parkinson's disease ?-managed with a SPT exchanged monthly  ? ?4. Nephrolithiasis ?-RUS 2022-no stones visualized ? ?5. Renal cysts ?-RUS 2022 - Bilateral simple renal cysts are noted.  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 Follow-up ultrasound in 1 year is recommended to ensure stability. ? ?6. Septic UTI ?-Hospitalized over Valentine's Day 2023 ? ?HPI: ?Roger Rodriguez is a 82 y.o. male who presents today at the request of his son, Roger Rodriguez, care giver, Roger Rodriguez, for SPT issues. ? ?They state that there was blood oozing around the SPT tube site, urine was looking dark and cloudy and patient stated he was not feeling well.  They did take a specimen to their PCPs office.  They state the specimen was obtained by clamping the Foley catheter. ? ?Urine culture was positive for Proteus vulgaris and Pseudomonas aeruginosa. ? ?  ? ?PMH: ?Past Medical History:  ?Diagnosis Date  ? Allergy   ? allergic rhinitis  ? Arthritis   ? OA  ? Chronic kidney disease   ? stones  ? Depression   ? Diabetes mellitus without complication ([place])   ? Family history of adverse reaction to anesthesia   ? Hematuria   ? History of kidney stones   ? Hyperlipidemia   ? Hypertension   ? no meds since parkinsons meds started  ? Incontinence of urine   ? Inguinal hernia   ? Neuromuscular disorder ([place])   ? parkinsons  ? Overactive bladder   ? Parkinson disease ([place])   ? Parkinson's disease ([place])   ? Prostate cancer ([place])   ? Rhinitis, allergic   ? Shortness of breath dyspnea   ? doe secondary to  parkinsons  ? ? ?Surgical History: ?Past Surgical History:  ?Procedure Laterality Date  ? BACK SURGERY  2014  ? CYSTOSCOPY WITH STENT PLACEMENT Right 01/13/2016  ? Procedure: CYSTOSCOPY WITH STENT PLACEMENT;  Surgeon: Hollice Espy, MD;  Location: [facility];  Service: Urology;  Laterality: Right;  ? CYSTOSCOPY/URETEROSCOPY/HOLMIUM LASER/STENT PLACEMENT Left 11/20/2015  ? Procedure: CYSTOSCOPY/URETEROSCOPY/HOLMIUM LASER/STENT PLACEMENT;  Surgeon: Hollice Espy, MD;  Location: [facility];  Service: Urology;  Laterality: Left;  ? EYE SURGERY    ? FRACTURE SURGERY    ?  ankle   ? H/O arthroscopy of right knee  2015  ? H/O radical protatectomy    ? HERNIA REPAIR    ? JOINT REPLACEMENT    ? right knee  ? LITHOTRIPSY  2010  ? Rotator Cuff tear  1993  ? Repaired  ? TOTAL KNEE REVISION Right 03/17/2018  ? Procedure: TOTAL KNEE REVISION;  Surgeon: Lovell Sheehan, MD;  Location: [facility];  Service: Orthopedics;  Laterality: Right;  ? TOTAL KNEE REVISION Right 06/08/2018  ? Procedure: TOTAL KNEE REVISION;  Surgeon: Lovell Sheehan, MD;  Location: [facility];  Service: Orthopedics;  Laterality: Right;  ? URETEROSCOPY WITH HOLMIUM LASER LITHOTRIPSY  2002?  ? URETEROSCOPY WITH HOLMIUM LASER LITHOTRIPSY Right 01/13/2016  ? Procedure: URETEROSCOPY WITH HOLMIUM LASER LITHOTRIPSY;  Surgeon: Hollice Espy, MD;  Location[facility];  Service: Urology;  Laterality: Right;  ? VASECTOMY    ? History  ? ? ?Home Medications:  ?Allergies as of 09/15/2021   ? ?   Reactions  ? Sulfa Antibiotics Swelling  ? Lip swelling  ? ?  ? ?  ?Medication List  ?  ? ?  ? Accurate as of September 15, 2021 11:59 PM. If you have any questions, ask your nurse or doctor.  ?  ?  ? ?  ? ?amantadine 100 MG capsule ?Commonly known as: SYMMETREL ?Take 100 mg by mouth daily as needed. ?  ?ARTIFICIAL TEARS OP ?Place 1 drop into both eyes 4 (four) times daily as needed (for dry eyes). ?  ?atropine 1 % ophthalmic solution ?Place 1 drop under the tongue at bedtime. ?  ?budesonide 32  MCG/ACT nasal spray ?Commonly known as: RHINOCORT AQUA ?USE 1 PUFF IN EACH NOSTRIL EVERY DAY AS NEEDED ?  ?busPIRone 7.5 MG tablet ?Commonly known as: BUSPAR ?Take 7.5 mg by mouth 2 (two) times daily. To 3 times daily ?  ?celecoxib 100 MG capsule ?Commonly known as: CELEBREX ?TAKE 1 CAPSULE BY MOUTH TWICE A DAY ?  ?cholecalciferol 1000 units tablet ?Commonly known as: VITAMIN D ?Take 1,000 Units by mouth daily. ?  ?ciprofloxacin 250 MG tablet ?Commonly known as: Cipro ?Take 1 tablet (250 mg total) by mouth 2 (two) times daily. ?Started by: Zara Council, PA-C ?  ?fexofenadine 180 MG tablet ?Commonly known as: ALLEGRA ?Take 180 mg by mouth daily. ?  ?folic acid 1 MG tablet ?Commonly known as: FOLVITE ?Take 1 tablet (1 mg total) by mouth daily. ?  ?gabapentin 100 MG capsule ?Commonly known as: NEURONTIN ?Take 200 mg by mouth 3 (three) times daily. ?  ?glucose blood test strip ?Commonly known as: Quintet AC Blood Glucose Test ?Use as instructed ?  ?hydrocortisone 2.5 % lotion ?Apply to the eyebrows, mid face, and beard area QHS on Tuesday, Thursday, and Saturday. ?  ?ketoconazole 2 % cream ?Commonly known as: NIZORAL ?Apply 1 application topically 2 (two) times daily. ?  ?metFORMIN 500 MG tablet ?Commonly known as: GLUCOPHAGE ?TAKE 1 TABLET BY MOUTH 2 TIMES DAILY WITH A MEAL. ?  ?mometasone 0.1 % cream ?Commonly known as: ELOCON ?Apply to scaly rash area on neck once to twice daily as needed. Avoid the face, groin, and axilla. ?  ?polyethylene glycol 17 g packet ?Commonly known as: MIRALAX / GLYCOLAX ?Take 17 g by mouth daily as needed. ?  ?pramipexole 0.5 MG tablet ?Commonly known as: MIRAPEX ?Take 2 tablets (1 mg total) by mouth at bedtime. ?  ?Rytary 23.75-95 MG Cpcr ?Generic drug: Carbidopa-Levodopa ER ?Take 2 capsules by mouth 2 (two) times daily. ?  ?Rytary 61.25-245 MG Cpcr ?Generic drug: Carbidopa-Levodopa ER ?Take by mouth. ?  ?carbidopa-levodopa 50-200 MG tablet ?Commonly known as: SINEMET CR ?Take 1 tablet  by mouth at bedtime. ?  ?vitamin B-12 1000 MCG tablet ?Commonly known as: CYANOCOBALAMIN ?Take 1,000 mcg by mouth daily. ?  ?vitamin C 500 MG tablet ?Commonly known as: ASCORBIC ACID ?Take 1,000 mg by mouth 2 (two) times daily. ?  ?vitamin E 180 MG (400 UNITS) capsule ?Take 400 Units by mouth daily. ?  ? ?  ? ? ?Allergies:  ?Allergies  ?Allergen Reactions  ? Sulfa Antibiotics Swelling  ?  Lip swelling  ? ? ?Family History: ?Family History  ?Problem Relation Age of Onset  ? Dementia Mother   ? Cerebral aneurysm Father   ? Bladder Cancer  Paternal Uncle   ? Liver cancer Sister   ? Prostate cancer Neg Hx   ? Kidney cancer Neg Hx   ? ? ?Social History:  reports that he quit smoking about 52 years ago. His smoking use included cigars. He has been exposed to tobacco smoke. He quit smokeless tobacco use about 17 years ago.  His smokeless tobacco use included chew. He reports that he does not drink alcohol and does not use drugs. ? ?ROS: ?Pertinent ROS in HPI ? ?Physical Exam: ?Constitutional:  Well nourished. Alert and oriented, No acute distress. ?HEENT: [place] AT, mask in place.  Trachea midline ?Cardiovascular: No clubbing, cyanosis, or edema. ?Respiratory: Normal respiratory effort, no increased work of breathing. ?Neurologic: Grossly intact, no focal deficits, moving all 4 extremities. ?Psychiatric: Normal mood and affect. ? ?Laboratory Data: ?Lab Results  ?Component Value Date  ? WBC 8.7 08/24/2021  ? HGB 12.3 (L) 08/24/2021  ? HCT 37.5 (L) 08/24/2021  ? MCV 97.9 08/24/2021  ? PLT 179 08/24/2021  ? ? ?Lab Results  ?Component Value Date  ? CREATININE 0.85 08/26/2021  ? ? ?Lab Results  ?Component Value Date  ? HGBA1C 7.1 (A) 05/28/2021  ? ? ?Lab Results  ?Component Value Date  ? TSH 1.970 12/31/2020  ? ? ?   ?Component Value Date/Time  ? CHOL 225 (H) 12/31/2020 1516  ? HDL 31 (L) 12/31/2020 1516  ? CHOLHDL 7.3 (H) 12/31/2020 1516  ? [place] 156 (H) 12/31/2020 1516  ? ? ?Lab Results  ?Component Value Date  ? AST 9 (L)  08/23/2021  ? ?Lab Results  ?Component Value Date  ? ALT <5 08/23/2021  ? ?Component 2 wk ago  ?Urine Culture, Routine Final report Abnormal    ?Organism ID, Bacteria Proteus vulgaris Abnormal    ?Comment: Multi

## 2021-09-26 ENCOUNTER — Ambulatory Visit: Payer: PPO | Admitting: Urology

## 2021-09-26 ENCOUNTER — Other Ambulatory Visit: Payer: Self-pay

## 2021-09-26 DIAGNOSIS — R339 Retention of urine, unspecified: Secondary | ICD-10-CM

## 2021-09-26 DIAGNOSIS — T83090A Other mechanical complication of cystostomy catheter, initial encounter: Secondary | ICD-10-CM

## 2021-09-26 MED ORDER — RENACIDIN IR SOLN: 30.0000 mL | Freq: Once | Status: DC

## 2021-09-26 NOTE — Progress Notes (Signed)
? ? ?09/26/2021 ?10:55 AM  ? ?Roger Rodriguez ?1940-11-15 ?[id number] ? ?Referring provider: [name]., MD ?[address] ?[address] ?[place],  [place] [address] ? ?Chief Complaint  ?Patient presents with  ? Urinary Retention  ? ?Urological history: ?1. 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 ?2. Prostate cancer ?-s/p prostatectomy 2006 ? ?3. Incontinence ?-contributing factors of age, Parkinson's disease ?-managed with a SPT exchanged monthly  ? ?4. Nephrolithiasis ?-RUS 2022-no stones visualized ? ?5. Renal cysts ?-RUS 2022 - Bilateral simple renal cysts are noted.  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 Follow-up ultrasound in 1 year is recommended to ensure stability. ? ?6. Septic UTI ?-Hospitalized over Valentine's Day 2023 ? ?HPI: ?Roger Rodriguez is a 81 y.o. male who presents today at the request of his son, [name], and care giver for SPT clogged and not draining.   ? ?They state that his SPT stopped draining during the night.  He has been leaking around the SPT site and through the penis.  They have been unable to irrigate the catheter.  ? ?PMH: ?Past Medical History:  ?Diagnosis Date  ? Allergy   ? allergic rhinitis  ? Arthritis   ? OA  ? Chronic kidney disease   ? stones  ? Depression   ? Diabetes mellitus without complication ([place])   ? Family history of adverse reaction to anesthesia   ? Hematuria   ? History of kidney stones   ? Hyperlipidemia   ? Hypertension   ? no meds since parkinsons meds started  ? Incontinence of urine   ? Inguinal hernia   ? Neuromuscular disorder ([place])   ? parkinsons  ? Overactive bladder   ? Parkinson disease ([place])   ? Parkinson's disease ([place])   ? Prostate cancer ([place])   ? Rhinitis, allergic   ? Shortness of breath dyspnea   ? doe secondary to parkinsons  ? ? ?Surgical History: ?Past Surgical History:  ?Procedure Laterality Date  ? BACK SURGERY  2014  ? CYSTOSCOPY WITH STENT PLACEMENT Right 01/13/2016  ?  Procedure: CYSTOSCOPY WITH STENT PLACEMENT;  Surgeon: Hollice Espy, MD;  Location: [facility];  Service: Urology;  Laterality: Right;  ? CYSTOSCOPY/URETEROSCOPY/HOLMIUM LASER/STENT PLACEMENT Left 11/20/2015  ? Procedure: CYSTOSCOPY/URETEROSCOPY/HOLMIUM LASER/STENT PLACEMENT;  Surgeon: Hollice Espy, MD;  Location: [facility];  Service: Urology;  Laterality: Left;  ? EYE SURGERY    ? FRACTURE SURGERY    ?  ankle   ? H/O arthroscopy of right knee  2015  ? H/O radical protatectomy    ? HERNIA REPAIR    ? JOINT REPLACEMENT    ? right knee  ? LITHOTRIPSY  2010  ? Rotator Cuff tear  1993  ? Repaired  ? TOTAL KNEE REVISION Right 03/17/2018  ? Procedure: TOTAL KNEE REVISION;  Surgeon: Lovell Sheehan, MD;  Location: [facility];  Service: Orthopedics;  Laterality: Right;  ? TOTAL KNEE REVISION Right 06/08/2018  ? Procedure: TOTAL KNEE REVISION;  Surgeon: Lovell Sheehan, MD;  Location: [facility];  Service: Orthopedics;  Laterality: Right;  ? URETEROSCOPY WITH HOLMIUM LASER LITHOTRIPSY  2002?  ? URETEROSCOPY WITH HOLMIUM LASER LITHOTRIPSY Right 01/13/2016  ? Procedure: URETEROSCOPY WITH HOLMIUM LASER LITHOTRIPSY;  Surgeon: Hollice Espy, MD;  Location: [facility];  Service: Urology;  Laterality: Right;  ? VASECTOMY    ? History  ? ? ?Home Medications:  ?Allergies as of 09/26/2021   ? ?  Reactions  ? Sulfa Antibiotics Swelling  ? Lip swelling  ? ?  ? ?  ?Medication List  ?  ? ?  ? Accurate as of September 26, 2021 11:59 PM. If you have any questions, ask your nurse or doctor.  ?  ?  ? ?  ? ?amantadine 100 MG capsule ?Commonly known as: SYMMETREL ?Take 100 mg by mouth daily as needed. ?  ?ARTIFICIAL TEARS OP ?Place 1 drop into both eyes 4 (four) times daily as needed (for dry eyes). ?  ?atropine 1 % ophthalmic solution ?Place 1 drop under the tongue at bedtime. ?  ?budesonide 32 MCG/ACT nasal spray ?Commonly known as: RHINOCORT AQUA ?USE 1 PUFF IN EACH NOSTRIL EVERY DAY AS NEEDED ?  ?busPIRone 7.5 MG tablet ?Commonly known as: BUSPAR ?Take  7.5 mg by mouth 2 (two) times daily. To 3 times daily ?  ?celecoxib 100 MG capsule ?Commonly known as: CELEBREX ?TAKE 1 CAPSULE BY MOUTH TWICE A DAY ?  ?cholecalciferol 1000 units tablet ?Commonly known as: VITAMIN D ?Take 1,000 Units by mouth daily. ?  ?ciprofloxacin 250 MG tablet ?Commonly known as: Cipro ?Take 1 tablet (250 mg total) by mouth 2 (two) times daily. ?  ?fexofenadine 180 MG tablet ?Commonly known as: ALLEGRA ?Take 180 mg by mouth daily. ?  ?folic acid 1 MG tablet ?Commonly known as: FOLVITE ?Take 1 tablet (1 mg total) by mouth daily. ?  ?gabapentin 100 MG capsule ?Commonly known as: NEURONTIN ?Take 200 mg by mouth 3 (three) times daily. ?  ?glucose blood test strip ?Commonly known as: Quintet AC Blood Glucose Test ?Use as instructed ?  ?hydrocortisone 2.5 % lotion ?Apply to the eyebrows, mid face, and beard area QHS on Tuesday, Thursday, and Saturday. ?  ?ketoconazole 2 % cream ?Commonly known as: NIZORAL ?Apply 1 application topically 2 (two) times daily. ?  ?metFORMIN 500 MG tablet ?Commonly known as: GLUCOPHAGE ?TAKE 1 TABLET BY MOUTH 2 TIMES DAILY WITH A MEAL. ?  ?mometasone 0.1 % cream ?Commonly known as: ELOCON ?Apply to scaly rash area on neck once to twice daily as needed. Avoid the face, groin, and axilla. ?  ?polyethylene glycol 17 g packet ?Commonly known as: MIRALAX / GLYCOLAX ?Take 17 g by mouth daily as needed. ?  ?pramipexole 0.5 MG tablet ?Commonly known as: MIRAPEX ?Take 2 tablets (1 mg total) by mouth at bedtime. ?  ?Rytary 23.75-95 MG Cpcr ?Generic drug: Carbidopa-Levodopa ER ?Take 2 capsules by mouth 2 (two) times daily. ?  ?Rytary 61.25-245 MG Cpcr ?Generic drug: Carbidopa-Levodopa ER ?Take by mouth. ?  ?carbidopa-levodopa 50-200 MG tablet ?Commonly known as: SINEMET CR ?Take 1 tablet by mouth at bedtime. ?  ?vitamin B-12 1000 MCG tablet ?Commonly known as: CYANOCOBALAMIN ?Take 1,000 mcg by mouth daily. ?  ?vitamin C 500 MG tablet ?Commonly known as: ASCORBIC ACID ?Take 1,000 mg  by mouth 2 (two) times daily. ?  ?vitamin E 180 MG (400 UNITS) capsule ?Take 400 Units by mouth daily. ?  ? ?  ? ? ?Allergies:  ?Allergies  ?Allergen Reactions  ? Sulfa Antibiotics Swelling  ?  Lip swelling  ? ? ?Family History: ?Family History  ?Problem Relation Age of Onset  ? Dementia Mother   ? Cerebral aneurysm Father   ? Bladder Cancer Paternal Uncle   ? Liver cancer Sister   ? Prostate cancer Neg Hx   ? Kidney cancer Neg Hx   ? ? ?Social History:  reports that he quit smoking about 61  years ago. His smoking use included cigars. He has been exposed to tobacco smoke. He quit smokeless tobacco use about 17 years ago.  His smokeless tobacco use included chew. He reports that he does not drink alcohol and does not use drugs. ? ?ROS: ?Pertinent ROS in HPI ? ?Physical Exam: ?Constitutional:  Well nourished. Alert and oriented, No acute distress. ?HEENT: [place] AT, mask in place.  Trachea midline ?Cardiovascular: No clubbing, cyanosis, or edema. ?Respiratory: Normal respiratory effort, no increased work of breathing. ?GU: No CVA tenderness.  SPT not draining.  Unable to irrigate.  ?Neurologic: Grossly intact, no focal deficits, moving all 4 extremities. ?Psychiatric: Normal mood and affect.  ? ?Laboratory Data: ?N/A ? ? ?Pertinent Imaging: ?N/A ? ?Suprapubic Cath Change ?Patient is present today for a suprapubic catheter change due to urinary retention.  9 ml of water was drained from the balloon, a 16 Silastic FR foley cath was removed from the tract with out difficulty.  Site was cleaned and prepped in a sterile fashion with betadine.  An 18 Silastic FR foley cath was replaced into the tract no complications were noted.  Urine return was noted, 10 ml of sterile water was inflated into the balloon Clear yellow urine drained from the bladder.  ? ?Bladder Irrigation ? ?Due to clogging of SPT patient is present today for a bladder irrigation. Patient was cleaned and prepped in a sterile fashion. 210 ml of saline/sterile  water was instilled and irrigated into the bladder with a 35m [place]y syringe through the catheter in place.  After irrigation urine flow was noted no complications were noted.   30 mL of Renacidin was instill

## 2021-09-29 DIAGNOSIS — L89612 Pressure ulcer of right heel, stage 2: Secondary | ICD-10-CM | POA: Diagnosis not present

## 2021-09-29 DIAGNOSIS — R600 Localized edema: Secondary | ICD-10-CM | POA: Diagnosis not present

## 2021-09-29 DIAGNOSIS — L8990 Pressure ulcer of unspecified site, unspecified stage: Secondary | ICD-10-CM | POA: Diagnosis not present

## 2021-09-29 DIAGNOSIS — G20 Parkinson's disease: Secondary | ICD-10-CM | POA: Diagnosis not present

## 2021-09-30 ENCOUNTER — Ambulatory Visit: Payer: PPO | Admitting: Urology

## 2021-10-13 NOTE — Progress Notes (Signed)
Suprapubic Cath Change ? ?Patient is present today for a suprapubic catheter change due to urinary retention.  8 ml of water was drained from the balloon, a 18 FR Silastic foley cath was removed from the tract with out difficulty.  Site was cleaned and prepped in a sterile fashion with betadine.  A 20 FR Silastic foley cath was replaced into the tract no complications were noted. Urine return was noted, 10 ml of sterile water was inflated into the balloon and a leg bag was attached for drainage.  Patient tolerated well.    ? ?Performed by: Zara Council, PA-C and Evelina Bucy, CMA ? ?Follow up: One month for SPT exchange   ?

## 2021-10-14 ENCOUNTER — Ambulatory Visit: Payer: PPO | Admitting: Urology

## 2021-10-14 DIAGNOSIS — Z435 Encounter for attention to cystostomy: Secondary | ICD-10-CM | POA: Diagnosis not present

## 2021-10-20 ENCOUNTER — Other Ambulatory Visit: Payer: PPO | Admitting: Student

## 2021-10-20 DIAGNOSIS — Z9359 Other cystostomy status: Secondary | ICD-10-CM | POA: Diagnosis not present

## 2021-10-20 DIAGNOSIS — R6889 Other general symptoms and signs: Secondary | ICD-10-CM

## 2021-10-20 DIAGNOSIS — G20 Parkinson's disease: Secondary | ICD-10-CM

## 2021-10-20 DIAGNOSIS — Z515 Encounter for palliative care: Secondary | ICD-10-CM | POA: Diagnosis not present

## 2021-10-20 NOTE — Progress Notes (Signed)
? ? ?[facility] ?Community Palliative Care Consult Note ?Telephone: [phone]  ?Fax: [phone]  ? ? ?Date of encounter: 10/20/21 ?12:28 PM ?PATIENT NAME: Roger Rodriguez ?Roger Rodriguez [id number]   ?[phone] (home)  ?DOB: Nov 30, 1940 ?MRN: [id number] ?PRIMARY CARE PROVIDER:    ?Roger Rodriguez., MD,  ?[address] ?[place] [address] ?[id number] ? ?REFERRING PROVIDER:   ?Roger Rodriguez., MD ?[address] ?[address] ?[place],  [place] [address] ?[phone] ? ?RESPONSIBLE PARTY:    ?Contact Information   ? ? Name Relation Home Work Mobile  ? Roger, Rodriguez Son [phone]    ? Roger, Rodriguez Son [phone]    ? ?  ? ? ? ?I met face to face with patient and caregiver in the home. Palliative Care was asked to follow this patient by consultation request of  Roger Rodriguez.,* to address advance care planning and complex medical decision making. This is a follow up visit. ? ?                                 ASSESSMENT AND PLAN / RECOMMENDATIONS:  ? ?Advance Care Planning/Goals of Care: Goals include to maximize quality of life and symptom management. Patient/health care surrogate gave his/her permission to discuss. ?Our advance care planning conversation included a discussion about:    ?The value and importance of advance care planning  ?Experiences with loved ones who have been seriously ill or have died  ?Exploration of personal, cultural or spiritual beliefs that might influence medical decisions  ?Exploration of goals of care in the event of a sudden injury or illness  ?CODE STATUS: DNR ? ?Education on Palliative Medicine. Will continue to provide supportive care; symptom management.  ? ?Symptom Management/Plan: ? ?Parkinson's Disease-Continue Rytary as directed. Endorses stiffness, no worsening dysphagia. Encourage use of mechanical lift for transfers. Follow up with Neurologist as scheduled. ? ?Suprapubic catheter-catheter  changed out last week per Urology due to blockage. Denies any concerns with catheter. Follow up with Urology as scheduled. ? ?Suspected Deep Tissue injury-left great toe. Apply skin prep BID, encourage to keep anything off foot that provides friction.  ? ?Follow up Palliative Care Visit: Palliative care will continue to follow for complex medical decision making, advance care planning, and clarification of goals. Return in 8 weeks or prn. ? ? ?This visit was coded based on medical decision making (MDM). ? ?PPS: 30% ? ?HOSPICE ELIGIBILITY/DIAGNOSIS: TBD ? ?Chief Complaint: Palliative Medicine Follow up visit. ? ?HISTORY OF PRESENT ILLNESS:  Roger Rodriguez is a 81 y.o. year old male  with Parkinson's disease, T2DM, OA, CKD, hypertension, depression. Hospitalized 2/10-2/14/23 due to sepsis, UTI, AKI.  ? ?Patient endorses generalized weakness; no longer receiving therapy. He is staying in bed more days per primary caregiver.  He denies having pain, shortness of breath, nausea or constipation.  He endorses fair appetite eating at least 2 good meals a day.  Caregiver states he could drink fluids better. Occasional coughing with solids and liquids. Patient has had suprapubic catheter changed out every 2 weeks recently due to blockages. Caregiver reports purple area to tip of left great toe. A 10 point review of systems is negative, except for the pertinent positives and negatives detailed in the HPI. ? ? ?History obtained from review of EMR, discussion with primary team, and interview with family, facility staff/caregiver and/or Roger Rodriguez.  ?I reviewed  available labs, medications, imaging, studies and related documents from the EMR.  Records reviewed and summarized above.  ? ? ?Physical Exam: ?Pulse 92, resp 20, b/p 138/84, sats 96%  ?Constitutional: NAD ?General: frail appearing, thin ?EYES: anicteric sclera, lids intact, no discharge  ?ENMT: intact hearing, oral mucous membranes moist, dentition intact ?CV: S1S2, RRR,  trace pedal edema ?Pulmonary: LCTA, no increased work of breathing, no cough, room air ?Abdomen:  normo-active BS + 4 quadrants, soft and non tender, no ascites ?GU: deferred ?MSK: moves all extremities, non-ambulatory ?Skin: warm and dry, no rashes, buttocks intact, Suspected DTI to left great toe, purple, does not blanch  ?Neuro: +generalized weakness, A & O x 3, speech mumbled, low in tone ?Psych: non-anxious affect, pleasant ?Hem/lymph/immuno: no widespread bruising ? ? ?Thank you for the opportunity to participate in the care of Roger Rodriguez.  The palliative care team will continue to follow. Please call our office at [phone] if we can be of additional assistance.  ? ?Roger Slocumb, NP  ? ?COVID-19 PATIENT SCREENING TOOL ?Asked and negative response unless otherwise noted:  ? ?Have you had symptoms of covid, tested positive or been in contact with someone with symptoms/positive test in the past 5-10 days? No ? ?

## 2021-10-21 ENCOUNTER — Emergency Department
Admission: EM | Admit: 2021-10-21 | Discharge: 2021-10-21 | Disposition: A | Discharge status: Home / Self Care | Payer: PPO | Attending: Emergency Medicine | Admitting: Emergency Medicine

## 2021-10-21 ENCOUNTER — Ambulatory Visit: Payer: PPO | Admitting: Urology

## 2021-10-21 ENCOUNTER — Other Ambulatory Visit: Payer: Self-pay

## 2021-10-21 ENCOUNTER — Encounter: Payer: Self-pay | Admitting: Intensive Care

## 2021-10-21 DIAGNOSIS — T83091A Other mechanical complication of indwelling urethral catheter, initial encounter: Secondary | ICD-10-CM | POA: Diagnosis not present

## 2021-10-21 DIAGNOSIS — Y69 Unspecified misadventure during surgical and medical care: Secondary | ICD-10-CM | POA: Diagnosis not present

## 2021-10-21 DIAGNOSIS — T83198A Other mechanical complication of other urinary devices and implants, initial encounter: Secondary | ICD-10-CM | POA: Diagnosis not present

## 2021-10-21 DIAGNOSIS — G20 Parkinson's disease: Secondary | ICD-10-CM | POA: Insufficient documentation

## 2021-10-21 DIAGNOSIS — L299 Pruritus, unspecified: Secondary | ICD-10-CM | POA: Diagnosis not present

## 2021-10-21 DIAGNOSIS — T83010A Breakdown (mechanical) of cystostomy catheter, initial encounter: Secondary | ICD-10-CM

## 2021-10-21 MED ORDER — CIPROFLOXACIN HCL 500 MG PO TABS: 500.0000 mg | ORAL_TABLET | Freq: Two times a day (BID) | ORAL | 0 refills | Status: DC

## 2021-10-21 NOTE — ED Notes (Signed)
Bladder scan 286 ml ?

## 2021-10-21 NOTE — ED Triage Notes (Signed)
Patient arrives by EMS from home with c/o suprapubic cath leaking. Reports leaking started last night. Catheter was replaced last week. HX parkinsons. Reports he lives at home with a 24/7 caregiver ?

## 2021-10-21 NOTE — ED Notes (Signed)
Suprapubic cath with leakage around the catheter and ?from penis.  New diaper applied.   ?Suprapubic cath replaced by dr Corky Downs with 20 fr--10 ml in balloon.  Patient tolerated well.  No leakage seen after cath change. ?

## 2021-10-21 NOTE — ED Triage Notes (Signed)
First nurse note: pt comes ems from home with catheter leaking. Hx of parkinson's. 113/66, HR 92, 98% RA.  ?

## 2021-10-21 NOTE — ED Notes (Signed)
Dc instructions and scripts reviewed with caregiver. No questions or concerns at this time. Will follow up with urology  ?

## 2021-10-21 NOTE — ED Provider Notes (Signed)
? ?[facility] ?Provider Note ? ? ? Event Date/Time  ? First MD Initiated Contact with Patient 10/21/21 1453   ?  (approximate) ? ? ?History  ? ?suprapubic cath leaking ? ? ?HPI ? ?Roger Rodriguez is a 81 y.o. male with a history of Parkinson's, suprapubic catheter who presents with suprapubic catheter dysfunction.  Aide reports that he has been complaining of swelling around his bladder, leaking around the catheter site for drainage from the catheter.  No reports of fevers.  Review of medical records demonstrates the patient had new catheter placed on April 4 ?  ? ? ?Physical Exam  ? ?Triage Vital Signs: ?ED Triage Vitals  ?Enc Vitals Group  ?   BP 10/21/21 1224 113/71  ?   Pulse Rate 10/21/21 1224 87  ?   Resp 10/21/21 1224 16  ?   Temp 10/21/21 1224 98.3 ?F (36.8 ?C)  ?   Temp Source 10/21/21 1224 Oral  ?   SpO2 10/21/21 1224 94 %  ?   Weight 10/21/21 1225 79.4 kg (175 lb)  ?   Height 10/21/21 1225 1.829 m (6')  ?   Head Circumference --   ?   Peak Flow --   ?   Pain Score 10/21/21 1225 6  ?   Pain Loc --   ?   Pain Edu? --   ?   Excl. in [place]? --   ? ? ?Most recent vital signs: ?Vitals:  ? 10/21/21 1650 10/21/21 1650  ?BP: 108/66 108/66  ?Pulse: 88 90  ?Resp: 16 16  ?Temp:  98.8 ?F (37.1 ?C)  ?SpO2: 94% 92%  ? ? ? ?General: Awake, no distress.  ?CV:  Good peripheral perfusion.  ?Resp:  Normal effort.  ?Abd:  No distention.  Suprapubic catheter with small amount of urine leaking around it.  Small amount of yellow urine in the urine bag ?Other:   ? ? ?ED Results / Procedures / Treatments  ? ?Labs ?(all labs ordered are listed, but only abnormal results are displayed) ?Labs Reviewed - No data to display ? ? ?EKG ? ? ? ? ?RADIOLOGY ?Approximately 300 cc on bladder scan ? ? ? ?PROCEDURES: ? ?Critical Care performed:  ? ?SUPRAPUBIC TUBE PLACEMENT ? ?Date/Time: 10/21/2021 10:51 PM ?Performed by: Lavonia Drafts, MD ?Authorized by: Lavonia Drafts, MD  ? ?Consent:  ?  Consent obtained:  Verbal ?  Consent  given by:  Patient ?  Risks, benefits, and alternatives were discussed: yes   ?Universal protocol:  ?  Patient identity confirmed:  Arm band ?Sedation:  ?  Sedation type:  None ?Anesthesia:  ?  Anesthesia method:  None ?Procedure details:  ?  Complexity:  Simple ?  Catheter type:  Foley ?  Catheter size:  18 Fr ?  Ultrasound guidance: no   ?  Number of attempts:  1 ?  Urine characteristics:  Cloudy ?Post-procedure details:  ?  Procedure completion:  Tolerated ? ? ?MEDICATIONS ORDERED IN ED: ?Medications - No data to display ? ? ?IMPRESSION / MDM / ASSESSMENT AND PLAN / ED COURSE  ?I reviewed the triage vital signs and the nursing notes. ? ? ?Patient with suprapubic catheter dysfunction, likely clogged, unable to flush.  Only about 300 cc but patient with discomfort around the bladder. ? ?Suprapubic catheter replaced by me, patient tolerated well and had relief with significant urine outflow.  Cloudy urine, will start the patient on antibiotics ? ? ? ? ?  ? ? ?  FINAL CLINICAL IMPRESSION(S) / ED DIAGNOSES  ? ?Final diagnoses:  ?Suprapubic catheter dysfunction, initial encounter ([place])  ? ? ? ?Rx / DC Orders  ? ?ED Discharge Orders   ? ?      Ordered  ?  ciprofloxacin (CIPRO) 500 MG tablet  2 times daily       ? 10/21/21 1638  ? ?  ?  ? ?  ? ? ? ?Note:  This document was prepared using Dragon voice recognition software and may include unintentional dictation errors. ?  ?Lavonia Drafts, MD ?10/21/21 2252 ? ?

## 2021-10-30 DIAGNOSIS — R600 Localized edema: Secondary | ICD-10-CM | POA: Diagnosis not present

## 2021-10-30 DIAGNOSIS — L89612 Pressure ulcer of right heel, stage 2: Secondary | ICD-10-CM | POA: Diagnosis not present

## 2021-10-30 DIAGNOSIS — L8990 Pressure ulcer of unspecified site, unspecified stage: Secondary | ICD-10-CM | POA: Diagnosis not present

## 2021-10-30 DIAGNOSIS — G20 Parkinson's disease: Secondary | ICD-10-CM | POA: Diagnosis not present

## 2021-10-31 ENCOUNTER — Ambulatory Visit: Payer: PPO | Admitting: Physician Assistant

## 2021-11-12 ENCOUNTER — Ambulatory Visit: Payer: PPO | Admitting: Physician Assistant

## 2021-11-12 ENCOUNTER — Other Ambulatory Visit: Payer: Self-pay | Admitting: Family Medicine

## 2021-11-12 NOTE — Progress Notes (Signed)
Suprapubic Cath Change ? ?Patient is present today for a suprapubic catheter change due to urinary retention.  3m of water was drained from the balloon, a 20 FR foley cath was removed from the tract with out difficulty.  Site was cleaned and prepped in a sterile fashion with betadine.  A 22 FR foley cath was replaced into the tract no complications were noted. 20 ml Urine return was noted, 10 ml of sterile water was inflated into the balloon and a  bag was attached for drainage.  Patient tolerated well. A night bag was given to patient and proper instruction was given on how to switch bags.   ? ?Performed by: SZara Council PA-C  ? ?Follow up: One month follow up for SPT exchange ? ?I,Kailey Littlejohn,acting as a sEducation administratorfor [employer] PA-C.,have documented all relevant documentation on the behalf of [place] PA-C,as directed by  [facility] PA-C while in the presence of SR[place] PA-C. ? ?

## 2021-11-13 ENCOUNTER — Ambulatory Visit: Payer: PPO | Admitting: Urology

## 2021-11-13 DIAGNOSIS — Z435 Encounter for attention to cystostomy: Secondary | ICD-10-CM | POA: Diagnosis not present

## 2021-11-13 DIAGNOSIS — R339 Retention of urine, unspecified: Secondary | ICD-10-CM | POA: Diagnosis not present

## 2021-11-13 NOTE — Telephone Encounter (Signed)
Requested Prescriptions  ?Pending Prescriptions Disp Refills  ?? gabapentin (NEURONTIN) 100 MG capsule [Pharmacy Med Name: GABAPENTIN 100 MG CAPSULE] 540 capsule 3  ?  Sig: TAKE 2 CAPSULES (200 MG TOTAL) BY MOUTH 3 (THREE) TIMES DAILY FOR 90 DAYS  ?  ? Neurology: Anticonvulsants - gabapentin Passed - 11/12/2021  3:37 PM  ?  ?  Passed - Cr in normal range and within 360 days  ?  Creatinine  ?Date Value Ref Range Status  ?04/06/2014 0.94 0.60 - 1.30 mg/dL Final  ? ?Creatinine, Ser  ?Date Value Ref Range Status  ?08/26/2021 0.85 0.61 - 1.24 mg/dL Final  ?   ?  ?  Passed - Completed PHQ-2 or PHQ-9 in the last 360 days  ?  ?  Passed - Valid encounter within last 12 months  ?  Recent Outpatient Visits   ?      ? 2 months ago Parkinson's disease ([place])  ? [facility] Thedore Mins, Ria Comment, PA-C  ? 3 months ago Pressure injury of contiguous region involving back and buttock, stage 2, unspecified laterality ([place])  ? [facility] Tally Joe T, FNP  ? 3 months ago Idiopathic Parkinson's disease [facility])  ? [place], DO  ? 5 months ago Diabetes mellitus without complication ([place])  ? [facility] [name]., MD  ? 10 months ago Idiopathic Parkinson's disease [name])  ? [facility] [name]., MD  ?  ?  ?Future Appointments   ?        ? Today [place], Gordan Payment [name]  ? In 1 week [name]., MD [facility]  ?  ? ?  ?  ?  ? ? ? ?

## 2021-11-14 ENCOUNTER — Other Ambulatory Visit: Payer: Self-pay | Admitting: Family Medicine

## 2021-11-14 DIAGNOSIS — E119 Type 2 diabetes mellitus without complications: Secondary | ICD-10-CM

## 2021-11-14 NOTE — Telephone Encounter (Signed)
Requested Prescriptions  ?Pending Prescriptions Disp Refills  ?? metFORMIN (GLUCOPHAGE) 500 MG tablet [Pharmacy Med Name: METFORMIN HCL 500 MG TABLET] 180 tablet 1  ?  Sig: TAKE 1 TABLET BY MOUTH 2 TIMES DAILY WITH A MEAL.  ?  ? Endocrinology:  Diabetes - Biguanides Failed - 11/14/2021  2:19 AM  ?  ?  Failed - B12 Level in normal range and within 720 days  ?  Vitamin B-12  ?Date Value Ref Range Status  ?08/23/2021 1,066 (H) 180 - 914 pg/mL Final  ?  Comment:  ?  (NOTE) ?This assay is not validated for testing neonatal or ?myeloproliferative syndrome specimens for Vitamin B12 levels. ?Performed at [facility], [place] [address] [place], [place] ?[address] ?  ?   ?  ?  Passed - Cr in normal range and within 360 days  ?  Creatinine  ?Date Value Ref Range Status  ?04/06/2014 0.94 0.60 - 1.30 mg/dL Final  ? ?Creatinine, Ser  ?Date Value Ref Range Status  ?08/26/2021 0.85 0.61 - 1.24 mg/dL Final  ?   ?  ?  Passed - HBA1C is between 0 and 7.9 and within 180 days  ?  Hemoglobin A1C  ?Date Value Ref Range Status  ?05/28/2021 7.1 (A) 4.0 - 5.6 % Final  ? ?Hgb A1c MFr Bld  ?Date Value Ref Range Status  ?12/31/2020 7.4 (H) 4.8 - 5.6 % Final  ?  Comment:  ?           Prediabetes: 5.7 - 6.4 ?         Diabetes: >6.4 ?         Glycemic control for adults with diabetes: <7.0 ?  ?   ?  ?  Passed - eGFR in normal range and within 360 days  ?  EGFR (African American)  ?Date Value Ref Range Status  ?04/06/2014 >60 >93m/min Final  ?04/04/2014 >60  Final  ? ?GFR calc Af AWyvonnia Lora ?Date Value Ref Range Status  ?08/15/2018 78 >59 mL/min/1.73 Final  ? ?EGFR (Non-African Amer.)  ?Date Value Ref Range Status  ?04/06/2014 >60 >646mmin Final  ?  Comment:  ?  eGFR values <6065min/1.73 m2 may be an indication of chronic ?kidney disease (CKD). ?Calculated eGFR, using the MRDR Study equation, is useful in  ?patients with stable renal function. ?The eGFR calculation will not be reliable in acutely ill patients ?when serum creatinine is changing  rapidly. It is not useful in ?patients on dialysis. The eGFR calculation may not be applicable ?to patients at the low and high extremes of body sizes, pregnant ?women, and vegetarians. ?  ?04/04/2014 54 (L)  Final  ?  Comment:  ?  eGFR values <34m37mn/1.73 m2 may be an indication of chronic ?kidney disease (CKD). ?Calculated eGFR is useful in patients with stable renal function. ?The eGFR calculation will not be reliable in acutely ill patients ?when serum creatinine is changing rapidly. It is not useful in  ?patients on dialysis. The eGFR calculation may not be applicable ?to patients at the low and high extremes of body sizes, pregnant ?women, and vegetarians. ?  ? ?GFR, Estimated  ?Date Value Ref Range Status  ?08/26/2021 >60 >60 mL/min Final  ?  Comment:  ?  (NOTE) ?Calculated using the CKD-EPI Creatinine Equation (2021) ?Performed at [id number]? ?eGFR  ?Date Value Ref Range Status  ?12/31/2020 88 >59 mL/min/1.73 Final  ?   ?  ?  Passed - Valid encounter within last 6 months  ?  Recent Outpatient Visits   ?      ? 2 months ago Parkinson's disease ([place])  ? [facility] Thedore Mins, Ria Comment, PA-C  ? 3 months ago Pressure injury of contiguous region involving back and buttock, stage 2, unspecified laterality ([place])  ? [facility] Tally Joe T, FNP  ? 3 months ago Idiopathic Parkinson's disease [facility])  ? [place], DO  ? 5 months ago Diabetes mellitus without complication ([place])  ? [facility] [name]., MD  ? 10 months ago Idiopathic Parkinson's disease [facility] [place])  ? [facility] [name]., MD  ?  ?  ?Future Appointments   ?        ? In 1 week [name]., MD [facility], [facility]  ? In 3 weeks McGowan, Gordan Payment [place]  ?  ? ?  ?  ?  Passed - CBC within normal limits and completed in the last  12 months  ?  WBC  ?Date Value Ref Range Status  ?08/24/2021 8.7 4.0 - 10.5 K/uL Final  ? ?RBC  ?Date Value Ref Range Status  ?08/24/2021 3.83 (L) 4.22 - 5.81 MIL/uL Final  ? ?Hemoglobin  ?Date Value Ref Range Status  ?08/24/2021 12.3 (L) 13.0 - 17.0 g/dL Final  ?12/31/2020 14.8 13.0 - 17.7 g/dL Final  ? ?HCT  ?Date Value Ref Range Status  ?08/24/2021 37.5 (L) 39.0 - 52.0 % Final  ? ?Hematocrit  ?Date Value Ref Range Status  ?12/31/2020 42.5 37.5 - 51.0 % Final  ? ?MCHC  ?Date Value Ref Range Status  ?08/24/2021 32.8 30.0 - 36.0 g/dL Final  ? ?MCH  ?Date Value Ref Range Status  ?08/24/2021 32.1 26.0 - 34.0 pg Final  ? ?MCV  ?Date Value Ref Range Status  ?08/24/2021 97.9 80.0 - 100.0 fL Final  ?12/31/2020 90 79 - 97 fL Final  ?04/03/2014 95 80 - 100 fL Final  ? ?No results found for: PLTCOUNTKUC, LABPLAT, [place] ?RDW  ?Date Value Ref Range Status  ?08/24/2021 14.6 11.5 - 15.5 % Final  ?12/31/2020 12.5 11.6 - 15.4 % Final  ?04/03/2014 13.9 11.5 - 14.5 % Final  ? ?  ?  ?  ? ?

## 2021-11-19 ENCOUNTER — Other Ambulatory Visit: Payer: PPO | Admitting: Student

## 2021-11-19 DIAGNOSIS — R52 Pain, unspecified: Secondary | ICD-10-CM | POA: Diagnosis not present

## 2021-11-19 DIAGNOSIS — Z515 Encounter for palliative care: Secondary | ICD-10-CM

## 2021-11-19 DIAGNOSIS — Z9359 Other cystostomy status: Secondary | ICD-10-CM

## 2021-11-19 DIAGNOSIS — G20 Parkinson's disease: Secondary | ICD-10-CM

## 2021-11-19 DIAGNOSIS — G20A1 Parkinson's disease without dyskinesia, without mention of fluctuations: Secondary | ICD-10-CM

## 2021-11-19 NOTE — Progress Notes (Signed)
? ? ?Manufacturing engineer ?Community Palliative Care Consult Note ?Telephone: [phone]  ?Fax: [phone]  ? ? ?Date of encounter: 11/19/21 ?2:11 PM ?PATIENT NAME: Roger Rodriguez ?[place] [address]   ?[phone] (home)  ?DOB: 10-Jan-1941 ?MRN: [id number] ?PRIMARY CARE PROVIDER:    ?Roger Rodriguez., MD,  ?[address] ?[place] [address] ?[phone] ? ?REFERRING PROVIDER:   ?Roger Rodriguez., MD ?[address] ?[address] ?[place],  [place] [address] ?[phone] ? ?RESPONSIBLE PARTY:    ?Contact Information   ? ? Name Relation Home Work Mobile  ? Roger, Rodriguez Son [phone]    ? Roger, Rodriguez Son [phone]    ? ?  ? ? ? ?I met face to face with patient and caregiver in the home. Palliative Care was asked to follow this patient by consultation request of  Roger Rodriguez.,* to address advance care planning and complex medical decision making. This is a follow up visit. ? ?                                 ASSESSMENT AND PLAN / RECOMMENDATIONS:  ? ?Advance Care Planning/Goals of Care: Goals include to maximize quality of life and symptom management. Patient/health care surrogate gave his/her permission to discuss. ?Our advance care planning conversation included a discussion about:    ?The value and importance of advance care planning  ?Experiences with loved ones who have been seriously ill or have died  ?Exploration of personal, cultural or spiritual beliefs that might influence medical decisions  ?Exploration of goals of care in the event of a sudden injury or illness  ?CODE STATUS: DNR ? ?Education on Palliative Medicine. Will continue to provide supportive care; symptom management. ? ?Symptom Management/Plan: ? ?Parkinson's Disease-Continue Rytary as directed. Endorses stiffness, no worsening dysphagia. Speech is mostly mumbled. Encourage use of mechanical lift for transfers. Patient will need a new w/c to aid in getting out of  bed, positioning. Follow up with Neurologist as scheduled. ? ?DME Request: Wheel chair ? ?Patient requires wheelchair due to diagnosis of Parkinson's disease. He has a mobility limitation that prevents him from completing ADL's, he is unable to safely ambulate with an assistive device. He has a caregiver available at all times who is able and willing to assist in propelling wheelchair. ? ?Pain-patient endorses worsening pain to bilateral legs, worse at night. Will trial increasing gabapentin to 300 mg QHS, continue 200 mg BID, Celebrex as directed. ? ?Suprapubic catheter-catheter is patent; caregivers are flushing daily. Routine changes per Urology; follow up as scheduled.  ? ?Follow up Palliative Care Visit: Palliative care will continue to follow for complex medical decision making, advance care planning, and clarification of goals. Return in 6-8 weeks or prn. ? ? ?This visit was coded based on medical decision making (MDM). ? ?PPS: 30% ? ?HOSPICE ELIGIBILITY/DIAGNOSIS: TBD ? ?Chief Complaint: Palliative Medicine follow up visit.  ? ?HISTORY OF PRESENT ILLNESS:  Roger Rodriguez is a 81 y.o. year old male  with Parkinson's disease, T2DM, OA, CKD, hypertension, depression.  ? ?Patient reports worsening pain to BLE, worse at night. He denies having shortness of breath, nausea, constipation. He is out of bed at least daily; requires assistance with transfers. His appetite is fair, he does require assistance with feeding. He does have occasional coughing with eating and drinking; denies any worsening dysphagia. Area to left great toe has  healed. A 10 point review of systems is negative, except for the pertinent positives and negatives detailed in the HPI. ? ?History obtained from review of EMR, discussion with primary team, and interview with family, facility staff/caregiver and/or Roger. Rodriguez.  ?I reviewed available labs, medications, imaging, studies and related documents from the EMR.  Records reviewed and summarized  above.  ? ?Physical Exam: ? ?Pulse 78, resp 16, b/p 137/88, sats 97% on room air ?Constitutional: NAD ?General: frail appearing, thin ?EYES: anicteric sclera, lids intact, no discharge  ?ENMT: intact hearing, oral mucous membranes moist, dentition intact ?CV: S1S2, RRR, no LE edema ?Pulmonary: LCTA, no increased work of breathing, no cough, room air ?Abdomen: normo-active BS + 4 quadrants, soft and non tender, no ascites ?GU: deferred ?MSK: moves all extremities, non-ambulatory ?Skin: warm and dry, no rashes or wounds on visible skin ?Neuro: +generalized weakness, speech is mumbled ?Psych: non-anxious affect, A and O x 3 ?Hem/lymph/immuno: no widespread bruising ? ? ?Thank you for the opportunity to participate in the care of Roger. Rodriguez.  The palliative care team will continue to follow. Please call our office at [phone] if we can be of additional assistance.  ? ?Roger Slocumb, NP  ? ?COVID-19 PATIENT SCREENING TOOL ?Asked and negative response unless otherwise noted:  ? ?Have you had symptoms of covid, tested positive or been in contact with someone with symptoms/positive test in the past 5-10 days? No ? ?

## 2021-11-25 ENCOUNTER — Ambulatory Visit: Payer: PPO | Admitting: Family Medicine

## 2021-11-25 ENCOUNTER — Ambulatory Visit: Payer: Self-pay | Admitting: *Deleted

## 2021-11-25 NOTE — Telephone Encounter (Signed)
Per agent: ?"Pt called and cancelled his appt today/ it was very hard to hear and understand him/ I could tell if he was cancelling due to being sick or if he normally sounds this way / wanted to have a nurse just check on him in case / please advise " ? ? ?Chief Complaint:  ?Symptoms:  ?Frequency:  ?Pertinent Negatives: Patient denies  ?Disposition: '[]'$ ED /'[]'$ Urgent Care (no appt availability in office) / '[]'$ Appointment(In office/virtual)/ '[]'$  [facility]/ '[]'$ Home Care/ '[]'$ Refused Recommended Disposition /'[]'$ [place] [facility]/ '[]'$  Follow-up with PCP ?Additional Notes:  H/O Parkinsons, speech garbled.  ?Spoke with  pt, states "I'm fine." Appreciative of call.  ? ?Answer Assessment - Initial Assessment Questions ?1. REASON FOR CALL: "What is your main concern right now?" ?    Agent concerned, pt's speech garbled ? ?Protocols used: No Guideline Available-A-AH ? ?

## 2021-11-27 ENCOUNTER — Telehealth: Payer: Self-pay

## 2021-11-27 NOTE — Telephone Encounter (Signed)
1229 pm.  Incoming call from [place] at the request of patient.  She is advised of previous bill that needs to be addressed with [employer] before the wheelchair can be processed.  Contact information provided for [employer].  [name] advised she will address this at the request of patient.

## 2021-11-27 NOTE — Telephone Encounter (Signed)
1210 pm.  Message received through [facility] regarding a previous balance on patient's account that needs to be addressed before a standard wheelchair can be processed.  Phone call made to patient and advised of above.  Contact information for [employer] given to patient.

## 2021-11-29 DIAGNOSIS — G20 Parkinson's disease: Secondary | ICD-10-CM | POA: Diagnosis not present

## 2021-11-29 DIAGNOSIS — L89612 Pressure ulcer of right heel, stage 2: Secondary | ICD-10-CM | POA: Diagnosis not present

## 2021-11-29 DIAGNOSIS — R600 Localized edema: Secondary | ICD-10-CM | POA: Diagnosis not present

## 2021-11-29 DIAGNOSIS — L8990 Pressure ulcer of unspecified site, unspecified stage: Secondary | ICD-10-CM | POA: Diagnosis not present

## 2021-12-11 ENCOUNTER — Ambulatory Visit: Payer: PPO | Admitting: Urology

## 2021-12-11 DIAGNOSIS — Z435 Encounter for attention to cystostomy: Secondary | ICD-10-CM | POA: Diagnosis not present

## 2021-12-11 DIAGNOSIS — R339 Retention of urine, unspecified: Secondary | ICD-10-CM | POA: Diagnosis not present

## 2021-12-11 NOTE — Progress Notes (Signed)
Suprapubic Cath Change  Patient is present today for a suprapubic catheter change due to urinary retention.  8 ml of water was drained from the balloon, a 22 FR foley cath was removed from the tract with out difficulty.  Site was cleaned and prepped in a sterile fashion with betadine.  A 22 FR foley cath was replaced into the tract no complications were noted. Cauterized small area of granulated tissue pileup about 7 cm. Urine return was noted, 10 ml of sterile water was inflated into the balloon and a  bag was attached for drainage.  Patient tolerated well. A night bag was given to patient and proper instruction was given on how to switch bags.    Performed by: Zara Council, PA-C   Follow up: One month for SPT exchange

## 2021-12-17 ENCOUNTER — Ambulatory Visit: Payer: PPO | Admitting: Family Medicine

## 2021-12-17 NOTE — Progress Notes (Deleted)
Established patient visit   Patient: Roger Rodriguez   DOB: 25-Nov-1940   81 y.o. Male  MRN: [id number] Visit Date: 12/17/2021  Today's healthcare provider: Wilhemena Durie, MD   No chief complaint on file.  Subjective    HPI  Hypertension, follow-up  BP Readings from Last 3 Encounters:  10/21/21 108/66  09/15/21 109/63  09/05/21 129/71   Wt Readings from Last 3 Encounters:  10/21/21 175 lb (79.4 kg)  09/05/21 220 lb (99.8 kg)  08/22/21 205 lb 14.6 oz (93.4 kg)     He was last seen for hypertension 4 months ago.  Management since that visit includes; Stay off enalapril and continue to monitor BP.  Outside blood pressures are {***enter patient reported home BP readings, or 'not being checked':1}.  Pertinent labs Lab Results  Component Value Date   CHOL 225 (H) 12/31/2020   HDL 31 (L) 12/31/2020   LDLCALC 156 (H) 12/31/2020   TRIG 204 (H) 12/31/2020   CHOLHDL 7.3 (H) 12/31/2020   Lab Results  Component Value Date   NA 136 08/24/2021   K 3.7 08/24/2021   CREATININE 0.85 08/26/2021   GFRNONAA >60 08/26/2021   GLUCOSE 126 (H) 08/24/2021   TSH 1.970 12/31/2020     The ASCVD Risk score (Arnett DK, et al., 2019) failed to calculate for the following reasons:   The 2019 ASCVD risk score is only valid for ages 24 to 34  ---------------------------------------------------------------------------------------------------   Medications: Outpatient Medications Prior to Visit  Medication Sig   amantadine (SYMMETREL) 100 MG capsule Take 100 mg by mouth daily as needed.   atropine 1 % ophthalmic solution Place 1 drop under the tongue at bedtime.   budesonide (RHINOCORT AQUA) 32 MCG/ACT nasal spray USE 1 PUFF IN EACH NOSTRIL EVERY DAY AS NEEDED   busPIRone (BUSPAR) 7.5 MG tablet Take 7.5 mg by mouth 2 (two) times daily. To 3 times daily   carbidopa-levodopa (SINEMET CR) 50-200 MG tablet Take 1 tablet by mouth at bedtime.   celecoxib (CELEBREX) 100 MG capsule TAKE 1  CAPSULE BY MOUTH TWICE A DAY   cholecalciferol (VITAMIN D) 1000 units tablet Take 1,000 Units by mouth daily.    ciprofloxacin (CIPRO) 500 MG tablet Take 1 tablet (500 mg total) by mouth 2 (two) times daily.   fexofenadine (ALLEGRA) 180 MG tablet Take 180 mg by mouth daily.   folic acid (FOLVITE) 1 MG tablet Take 1 tablet (1 mg total) by mouth daily.   gabapentin (NEURONTIN) 100 MG capsule TAKE 2 CAPSULES (200 MG TOTAL) BY MOUTH 3 (THREE) TIMES DAILY FOR 90 DAYS   glucose blood (QUINTET AC BLOOD GLUCOSE TEST) test strip Use as instructed   hydrocortisone 2.5 % lotion Apply to the eyebrows, mid face, and beard area QHS on Tuesday, Thursday, and Saturday.   Hypromellose (ARTIFICIAL TEARS OP) Place 1 drop into both eyes 4 (four) times daily as needed (for dry eyes).    ketoconazole (NIZORAL) 2 % cream Apply 1 application topically 2 (two) times daily.   metFORMIN (GLUCOPHAGE) 500 MG tablet TAKE 1 TABLET BY MOUTH 2 TIMES DAILY WITH A MEAL.   mometasone (ELOCON) 0.1 % cream Apply to scaly rash area on neck once to twice daily as needed. Avoid the face, groin, and axilla.   polyethylene glycol (MIRALAX / GLYCOLAX) packet Take 17 g by mouth daily as needed.    pramipexole (MIRAPEX) 0.5 MG tablet Take 2 tablets (1 mg total) by mouth at bedtime.  RYTARY 23.75-95 MG CPCR Take 2 capsules by mouth 2 (two) times daily.   RYTARY 61.25-245 MG CPCR Take by mouth.   vitamin B-12 (CYANOCOBALAMIN) 1000 MCG tablet Take 1,000 mcg by mouth daily.   vitamin C (ASCORBIC ACID) 500 MG tablet Take 1,000 mg by mouth 2 (two) times daily.   vitamin E 180 MG (400 UNITS) capsule Take 400 Units by mouth daily.    Facility-Administered Medications Prior to Visit  Medication Dose Route Frequency Provider   gluconic acid-citric acid (RENACIDIN) irrigation 30 mL  30 mL Irrigation Once McGowan, Shannon A, PA-C    Review of Systems  Constitutional:  Negative for appetite change, chills and fever.  Respiratory:  Negative for  chest tightness, shortness of breath and wheezing.   Cardiovascular:  Negative for chest pain and palpitations.  Gastrointestinal:  Negative for abdominal pain, nausea and vomiting.   {Labs  Heme  Chem  Endocrine  Serology  Results Review (optional):23779}   Objective    There were no vitals taken for this visit. {Show previous vital signs (optional):23777}  Physical Exam  ***  No results found for any visits on 12/17/21.  Assessment & Plan     ***  No follow-ups on file.      {provider attestation***:1}   Wilhemena Durie, MD  [facility] [phone] (phone) [phone] (fax)  [place]

## 2021-12-18 ENCOUNTER — Ambulatory Visit (INDEPENDENT_AMBULATORY_CARE_PROVIDER_SITE_OTHER): Payer: PPO | Admitting: Family Medicine

## 2021-12-18 ENCOUNTER — Encounter: Payer: Self-pay | Admitting: Family Medicine

## 2021-12-18 DIAGNOSIS — Z9359 Other cystostomy status: Secondary | ICD-10-CM

## 2021-12-18 DIAGNOSIS — R197 Diarrhea, unspecified: Secondary | ICD-10-CM

## 2021-12-18 DIAGNOSIS — I10 Essential (primary) hypertension: Secondary | ICD-10-CM | POA: Diagnosis not present

## 2021-12-18 DIAGNOSIS — K5900 Constipation, unspecified: Secondary | ICD-10-CM

## 2021-12-18 DIAGNOSIS — G20 Parkinson's disease: Secondary | ICD-10-CM

## 2021-12-18 DIAGNOSIS — E119 Type 2 diabetes mellitus without complications: Secondary | ICD-10-CM | POA: Diagnosis not present

## 2021-12-18 NOTE — Progress Notes (Unsigned)
Virtual telephone visit    Virtual Visit via Telephone Note   This visit type was conducted due to national recommendations for restrictions regarding the COVID-19 Pandemic (e.g. social distancing) in an effort to limit this patient's exposure and mitigate transmission in our community. Due to his co-morbid illnesses, this patient is at least at moderate risk for complications without adequate follow up. This format is felt to be most appropriate for this patient at this time. The patient did not have access to video technology or had technical difficulties with video requiring transitioning to audio format only (telephone). Physical exam was limited to content and character of the telephone converstion.    Patient location: home  Provider location: [facility]   I discussed the limitations of evaluation and management by telemedicine and the availability of in person appointments. The patient expressed understanding and agreed to proceed.   Visit Date: 12/18/2021  Today's healthcare provider: Wilhemena Durie, MD   No chief complaint on file. Spoke with [name], patient's son Subjective    HPI  Phone call was with patient's son [name]. Discussing his dad's ongoing issues with Parkinson's associated problems.  He is having more days where he sleeps a lot now.  His blood pressure is a little labile running anywhere from 1 48-5 60 systolic.  He is asymptomatic with any of this. He has constipation at times and then occasionally has mild fecal incontinence. He also has occasional low-grade fevers to 9999.5 with no other symptoms other than the above.   Hypertension, follow-up  BP Readings from Last 3 Encounters:  10/21/21 108/66  09/15/21 109/63  09/05/21 129/71   Wt Readings from Last 3 Encounters:  10/21/21 175 lb (79.4 kg)  09/05/21 220 lb (99.8 kg)  08/22/21 205 lb 14.6 oz (93.4 kg)     He was last seen for hypertension 4 months ago.  Management since that visit includes;  Stay off enalapril and continue to monitor BP.   Outside blood pressures are fluctuating 120/74 on average per son, can be so high as 146/89. Fasting Glucose 110 yesterday / usually under 130.  Reports patient is running low grade fever maybe once weekly.  Patient is very tired with no energy / lots of sleeping and staying in bed.   Reports a lot of loose BM's, or can be  constipated for several days.   States patients mind is 100 percent.  Pertinent labs Lab Results  Component Value Date   CHOL 225 (H) 12/31/2020   HDL 31 (L) 12/31/2020   LDLCALC 156 (H) 12/31/2020   TRIG 204 (H) 12/31/2020   CHOLHDL 7.3 (H) 12/31/2020   Lab Results  Component Value Date   NA 136 08/24/2021   K 3.7 08/24/2021   CREATININE 0.85 08/26/2021   GFRNONAA >60 08/26/2021   GLUCOSE 126 (H) 08/24/2021   TSH 1.970 12/31/2020     The ASCVD Risk score (Arnett DK, et al., 2019) failed to calculate for the following reasons:   The 2019 ASCVD risk score is only valid for ages 9 to 73  ---------------------------------------------------------------------------------------------------     Medications: Outpatient Medications Prior to Visit  Medication Sig   amantadine (SYMMETREL) 100 MG capsule Take 100 mg by mouth daily as needed.   atropine 1 % ophthalmic solution Place 1 drop under the tongue at bedtime.   budesonide (RHINOCORT AQUA) 32 MCG/ACT nasal spray USE 1 PUFF IN EACH NOSTRIL EVERY DAY AS NEEDED   busPIRone (BUSPAR) 7.5 MG tablet Take 7.5  mg by mouth 2 (two) times daily. To 3 times daily   carbidopa-levodopa (SINEMET CR) 50-200 MG tablet Take 1 tablet by mouth at bedtime.   celecoxib (CELEBREX) 100 MG capsule TAKE 1 CAPSULE BY MOUTH TWICE A DAY   cholecalciferol (VITAMIN D) 1000 units tablet Take 1,000 Units by mouth daily.    ciprofloxacin (CIPRO) 500 MG tablet Take 1 tablet (500 mg total) by mouth 2 (two) times daily.   fexofenadine (ALLEGRA) 180 MG tablet Take 180 mg by mouth daily.   folic acid  (FOLVITE) 1 MG tablet Take 1 tablet (1 mg total) by mouth daily.   gabapentin (NEURONTIN) 100 MG capsule TAKE 2 CAPSULES (200 MG TOTAL) BY MOUTH 3 (THREE) TIMES DAILY FOR 90 DAYS   glucose blood (QUINTET AC BLOOD GLUCOSE TEST) test strip Use as instructed   hydrocortisone 2.5 % lotion Apply to the eyebrows, mid face, and beard area QHS on Tuesday, Thursday, and Saturday.   Hypromellose (ARTIFICIAL TEARS OP) Place 1 drop into both eyes 4 (four) times daily as needed (for dry eyes).    ketoconazole (NIZORAL) 2 % cream Apply 1 application topically 2 (two) times daily.   metFORMIN (GLUCOPHAGE) 500 MG tablet TAKE 1 TABLET BY MOUTH 2 TIMES DAILY WITH A MEAL.   mometasone (ELOCON) 0.1 % cream Apply to scaly rash area on neck once to twice daily as needed. Avoid the face, groin, and axilla.   polyethylene glycol (MIRALAX / GLYCOLAX) packet Take 17 g by mouth daily as needed.    pramipexole (MIRAPEX) 0.5 MG tablet Take 2 tablets (1 mg total) by mouth at bedtime.   RYTARY 23.75-95 MG CPCR Take 2 capsules by mouth 2 (two) times daily.   RYTARY 61.25-245 MG CPCR Take by mouth.   vitamin B-12 (CYANOCOBALAMIN) 1000 MCG tablet Take 1,000 mcg by mouth daily.   vitamin C (ASCORBIC ACID) 500 MG tablet Take 1,000 mg by mouth 2 (two) times daily.   vitamin E 180 MG (400 UNITS) capsule Take 400 Units by mouth daily.    Facility-Administered Medications Prior to Visit  Medication Dose Route Frequency Provider   gluconic acid-citric acid (RENACIDIN) irrigation 30 mL  30 mL Irrigation Once McGowan, Shannon A, PA-C    Review of Systems  Last thyroid functions Lab Results  Component Value Date   TSH 1.970 12/31/2020       Objective    There were no vitals taken for this visit. BP Readings from Last 3 Encounters:  10/21/21 108/66  09/15/21 109/63  09/05/21 129/71   Wt Readings from Last 3 Encounters:  10/21/21 175 lb (79.4 kg)  09/05/21 220 lb (99.8 kg)  08/22/21 205 lb 14.6 oz (93.4 kg)          Assessment & Plan     1. Essential (primary) hypertension Running slightly high blood pressure on no blood pressure medications presently.  Would not treat at this point in time.  2. Diabetes mellitus type 2 in nonobese (HCC) A1c the next time is in office.  3. Parkinson's disease ([place]) Progressive Parkinson's disease.  Patient rarely gets out of bed.  He is sleeping more. I think it is long-term prognosis is 1 to 2 years. I think he is getting good care at home with in-home 24-hour care  4. Suprapubic catheter ([place])   5. Diarrhea, unspecified type   6. Constipation, unspecified constipation type Metamucil daily and will also send in either Linzess or Amitiza daily, depending on which is covered by insurance  better.   No follow-ups on file.    I discussed the assessment and treatment plan with the patient. The patient was provided an opportunity to ask questions and all were answered. The patient agreed with the plan and demonstrated an understanding of the instructions.   The patient was advised to call back or seek an in-person evaluation if the symptoms worsen or if the condition fails to improve as anticipated.  I provided 13 minutes of non-face-to-face time during this encounter.  I, Wilhemena Durie, MD, have reviewed all documentation for this visit. The documentation on 12/22/21 for the exam, diagnosis, procedures, and orders are all accurate and complete.   Enaya Howze Cranford Mon, MD [facility] [phone] (phone) [phone] (fax)  [place]

## 2021-12-19 ENCOUNTER — Telehealth: Payer: Self-pay

## 2021-12-19 NOTE — Telephone Encounter (Signed)
Copied from [place] [id number]. Topic: General - Inquiry >> Dec 19, 2021  8:35 AM Roger Rodriguez wrote: Reason for CRM: Roger Rodriguez pt son stated he was supposed to get a call from [place] yesterday at 3:00 PM Roger Rodriguez stated the nurse called and advised him [name] was busy and would call later, but he never heard from Roger Rodriguez.  Please call back at [phone] as soon as possible pt son was upset. >> Dec 19, 2021 11:30 AM Roger Rodriguez wrote: Pts son  Roger Rodriguez called and stated he just received/ missed a call from the office / office not sure who called pts son/ please advise / he has been waiting on a call from Roger Rodriguez

## 2021-12-22 DIAGNOSIS — G20 Parkinson's disease: Secondary | ICD-10-CM | POA: Diagnosis not present

## 2021-12-22 MED ORDER — LUBIPROSTONE 8 MCG PO CAPS: 8.0000 ug | ORAL_CAPSULE | Freq: Two times a day (BID) | ORAL | 11 refills | Status: DC

## 2022-01-05 ENCOUNTER — Other Ambulatory Visit: Payer: Self-pay | Admitting: Family Medicine

## 2022-01-07 DIAGNOSIS — G4752 REM sleep behavior disorder: Secondary | ICD-10-CM | POA: Diagnosis not present

## 2022-01-07 DIAGNOSIS — F0280 Dementia in other diseases classified elsewhere without behavioral disturbance: Secondary | ICD-10-CM | POA: Diagnosis not present

## 2022-01-07 DIAGNOSIS — G20 Parkinson's disease: Secondary | ICD-10-CM | POA: Diagnosis not present

## 2022-01-07 DIAGNOSIS — K5900 Constipation, unspecified: Secondary | ICD-10-CM | POA: Diagnosis not present

## 2022-01-07 DIAGNOSIS — F419 Anxiety disorder, unspecified: Secondary | ICD-10-CM | POA: Diagnosis not present

## 2022-01-07 DIAGNOSIS — F32A Depression, unspecified: Secondary | ICD-10-CM | POA: Diagnosis not present

## 2022-01-07 DIAGNOSIS — G2581 Restless legs syndrome: Secondary | ICD-10-CM | POA: Diagnosis not present

## 2022-01-08 ENCOUNTER — Ambulatory Visit: Payer: PPO | Admitting: Urology

## 2022-01-08 DIAGNOSIS — R339 Retention of urine, unspecified: Secondary | ICD-10-CM

## 2022-01-08 DIAGNOSIS — Z435 Encounter for attention to cystostomy: Secondary | ICD-10-CM

## 2022-01-08 NOTE — Progress Notes (Addendum)
Suprapubic Cath Change  Patient is present today for a suprapubic catheter change due to urinary retention.  8 ml of water was drained from the balloon cauterized tissue with silver nitrate , a 22FR foley cath was removed from the tract with out difficulty.  Site was cleaned and prepped in a sterile fashion with betadine.  A 22 FR foley cath was replaced into the tract no complications were noted. 30 ml of urine return was noted, 10 ml of sterile water was inflated into the balloon and a  leg bag was attached for drainage.  Patient tolerated well. A night bag was given to patient and proper instruction was given on how to switch bags.    Performed by: Zara Council, PA-C and Kerman Passey, CMA  Follow up: Return in 1 month for SPT change   I,Kailey Littlejohn,acting as a scribe for [facility], PA-C.,have documented all relevant documentation on the behalf of Roger Cowdrey, PA-C,as directed by  [facility], PA-C while in the presence of Seyed Heffley, PA-C.  I have reviewed the above documentation for accuracy and completeness, and I agree with the above.    Zara Council, PA-C

## 2022-01-09 IMAGING — CR DG CHEST 2V
1 series · 2 of 2 positions shown · non-contrast
Comparison: One-view chest x-ray 10/15/2017

CLINICAL DATA: Shortness of breath.  COVID.

EXAM:
CHEST - 2 VIEW

[Series 1: dg chest 2 view · 0.14mm/px · 2 of 2 slices shown]
[im 1/2]
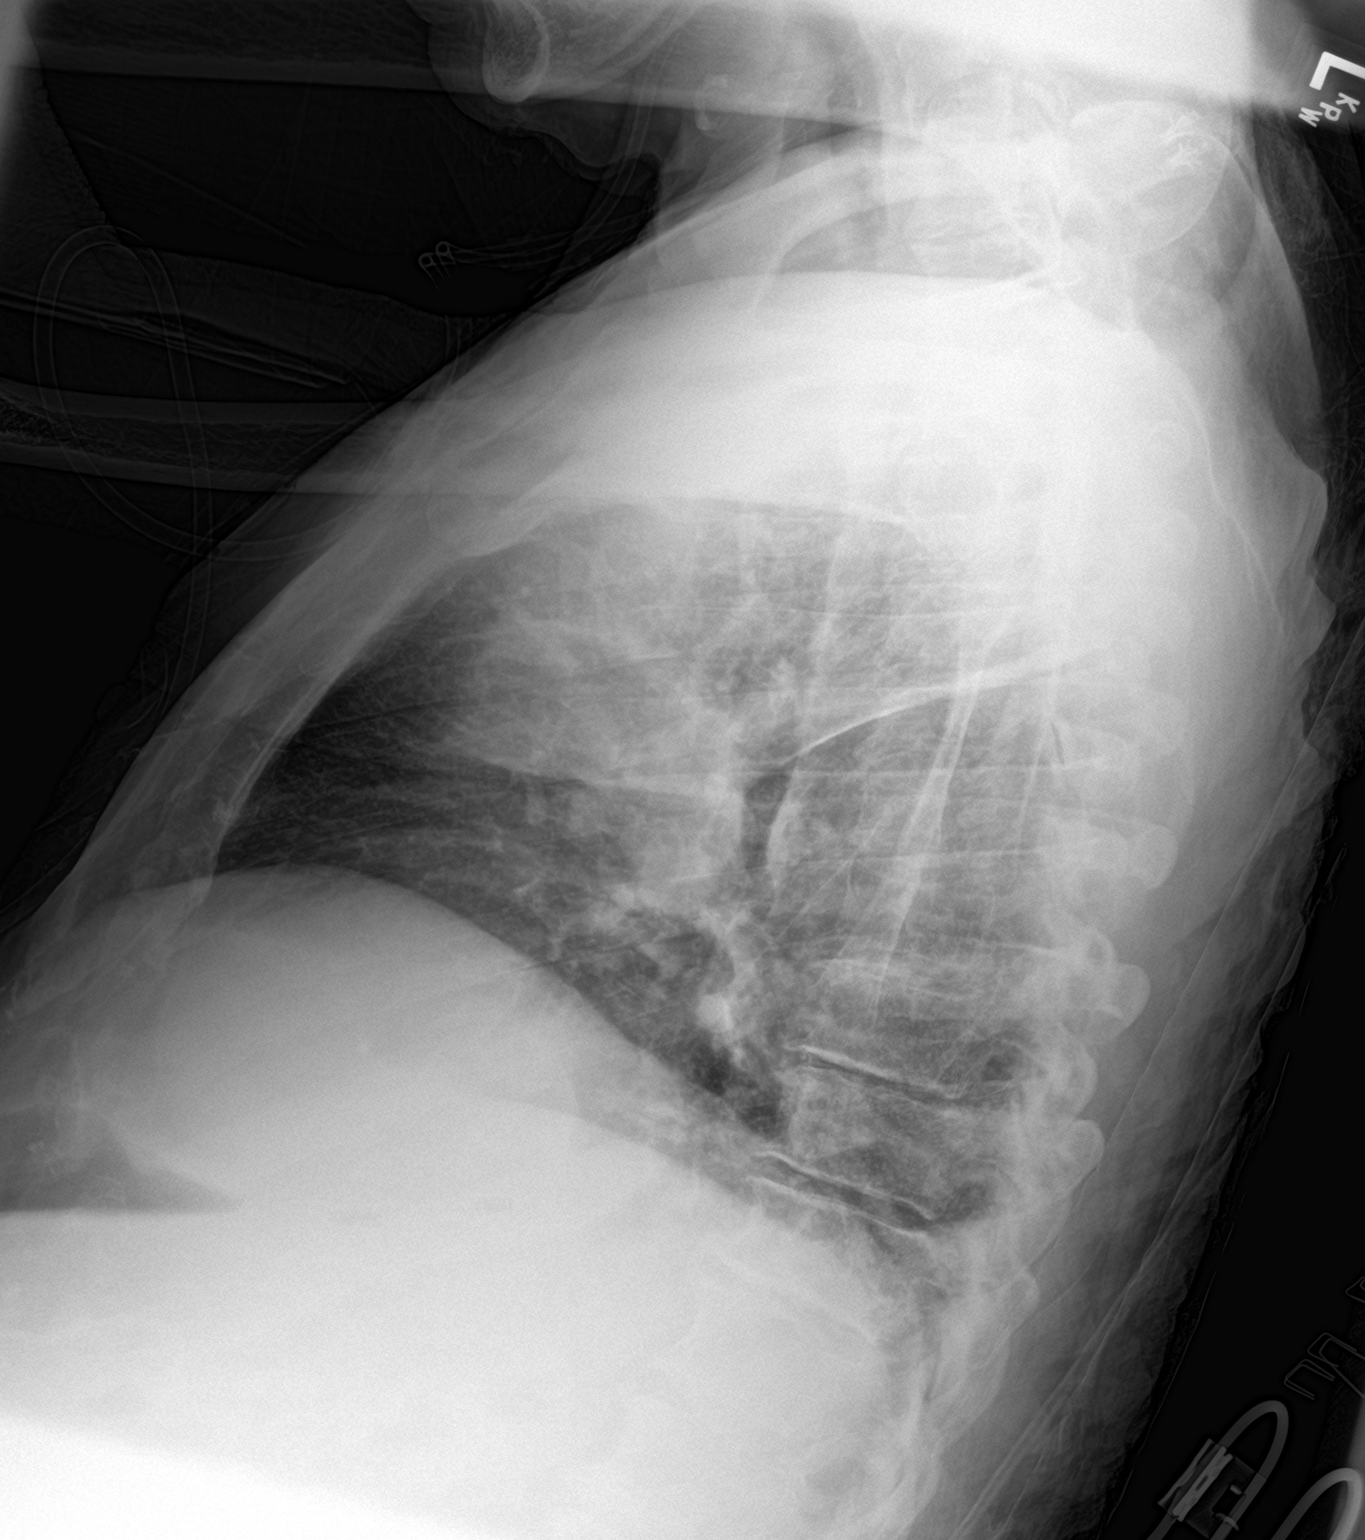
[im 2/2]
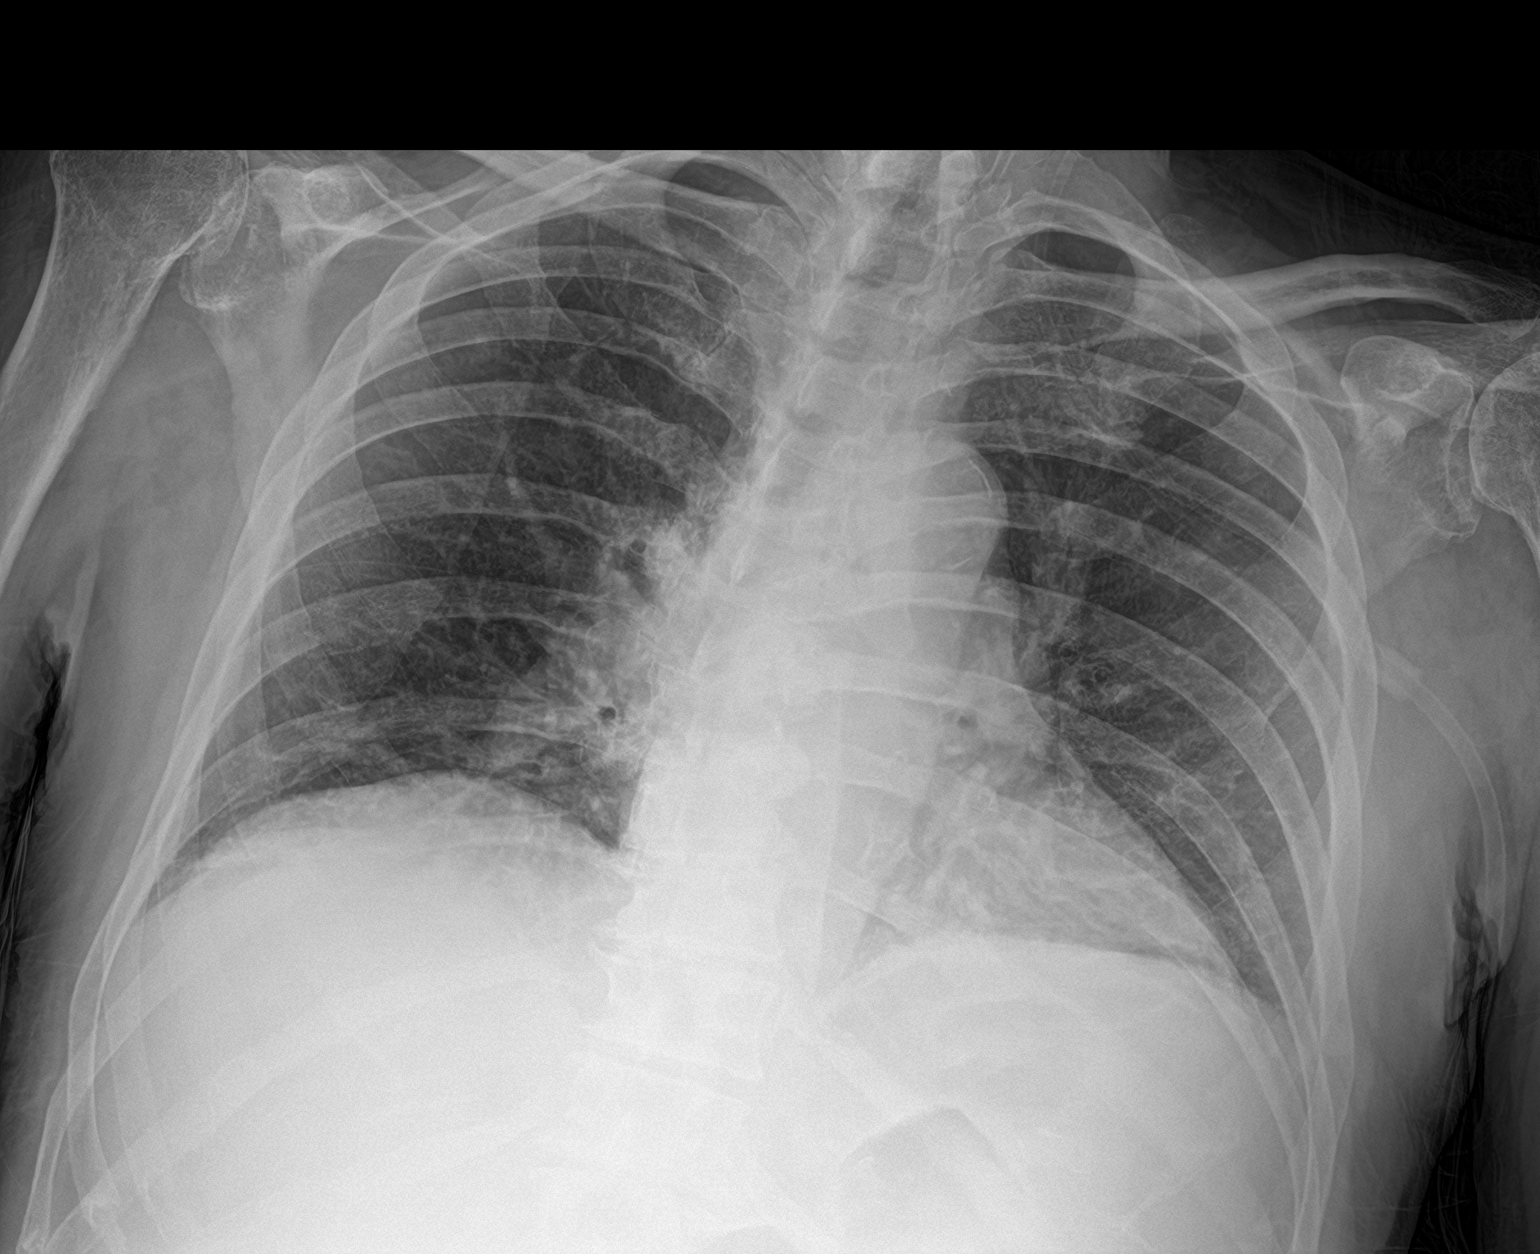

[2 of 2 positions shown; findings below may reference images not displayed]

FINDINGS: Heart size is normal. Mild pulmonary vascular congestion is present.
New left upper lobe and bilateral lower lobe airspace opacities are
present. Lung volumes are low. Degenerative and postoperative
changes are noted in the right shoulder.
IMPRESSION: 1. New left upper lobe and bilateral lower lobe airspace disease
compatible with pneumonia.
This may be related to viral disease.
2. Low lung volumes and mild pulmonary vascular congestion.

## 2022-01-19 ENCOUNTER — Other Ambulatory Visit: Payer: PPO | Admitting: Student

## 2022-01-19 DIAGNOSIS — S31819A Unspecified open wound of right buttock, initial encounter: Secondary | ICD-10-CM

## 2022-01-19 DIAGNOSIS — G20 Parkinson's disease: Secondary | ICD-10-CM

## 2022-01-19 DIAGNOSIS — Z515 Encounter for palliative care: Secondary | ICD-10-CM | POA: Diagnosis not present

## 2022-01-19 DIAGNOSIS — K5900 Constipation, unspecified: Secondary | ICD-10-CM | POA: Diagnosis not present

## 2022-01-19 NOTE — Progress Notes (Signed)
[facility] Pall[facility] Care Consult Note Telephone: [phone]  Fax: [phone]    Date of encounter: 01/19/22 2:02 PM PATIENT NAME: Roger Rodriguez [address] [place] [address]   [phone] (home)  DOB: 05/24/1941 MRN: [id number] PRIMARY CARE PROVIDER:    Jerrol Rodriguez., MD,  [address] [place] [address] [phone]  REFERRING PROVIDER:   Jerrol Rodriguez., MD [address] [place],  [place] [address] [phone]  RESPONSIBLE PARTY:    Contact Information     Name Relation Home Work Lincoln Son [phone]     Roger, Rodriguez [phone]          I met face to face with patient and caregiver in the home. Palliative Care was asked to follow this patient by consultation request of  Roger Rodriguez.,* to address advance care planning and complex medical decision making. This is a follow up visit.                                   ASSESSMENT AND PLAN / RECOMMENDATIONS:   Advance Care Planning/Goals of Care: Goals include to maximize quality of life and symptom management. Patient/health care surrogate gave his/her permission to discuss. Our advance care planning conversation included a discussion about:    The value and importance of advance care planning  Experiences with loved ones who have been seriously ill or have died  Exploration of personal, cultural or spiritual beliefs that might influence medical decisions  Exploration of goals of care in the event of a sudden injury or illness  CODE STATUS: DNR   Education on Palliative Medicine. Will continue to provide supportive care; symptom management.  Symptom Management/Plan:  Parkinson's Disease-Patient requires assistance with all adl's. New gait belt has been better for caregiver transfers. Endorses stiffness, no worsening dysphagia or changes in his speech. He is sleeping a little more  during the day. New w/c received; getting out of bed daily to w/c. Continue Rytary and amantadine as scheduled. Follow up with Neurologist as scheduled.  Constipation-recommend diet with fiber, adequate fluids. He has new prescription for Amitiza per PCP, but has not started yet. Education provided; he will start medication.  Wound to right buttocks-Has a small open area right buttocks; Continue to apply skin prep to peri area and Derma cloud daily. Recommend off loading, turning and reposition every two hours. Monitor for worsening of wound, signs of infection.    Follow up Palliative Care Visit: Palliative care will continue to follow for complex medical decision making, advance care planning, and clarification of goals. Return in 8 weeks or prn.   This visit was coded based on medical decision making (MDM).  PPS: 30%  HOSPICE ELIGIBILITY/DIAGNOSIS: TBD  Chief Complaint: Palliative Medicine follow up visit.   HISTORY OF PRESENT ILLNESS:  Roger Rodriguez is a 81 y.o. year old male  with Parkinson's disease, T2DM, OA, CKD, hypertension, depression.    Patient reports leg pain at night is better since last visit. Has a small open area right buttocks; applying skin prep to periwound and Derma cloud per caregiver Roger Rodriguez. Still sleeping more during the day. Endorses worsening stiffness, freezing. Endorses constipation. Has new order for Amitiza; hasn't started yet. Appetite fair to good; he is eating 2 good meals a day. Has a new w/c, working well. No shortness of  breath. A 10 point review of systems is negative, except for the pertinent positives and negatives detailed in the HPI.  Patient received sitting up to w/c. His speech is more clear today. Open to buttocks not visualized; he denies pain to area.   History obtained from review of EMR, discussion with primary team, and interview with family, facility staff/caregiver and/or Roger Rodriguez.  I reviewed available labs, medications, imaging,  studies and related documents from the EMR.  Records reviewed and summarized above.   Physical Exam:  Pulse 88, resp 16, b/p 122/70, sats 99% on room air Constitutional: NAD General: frail appearing EYES: anicteric sclera, lids intact, no discharge  ENMT: intact hearing, oral mucous membranes moist, dentition intact CV: S1S2, RRR, no LE edema Pulmonary: LCTA, no increased work of breathing, no cough, room air Abdomen: normo-active BS + 4 quadrants, soft and non tender GU: deferred MSK: non-ambulatory Skin: warm and dry, no rashes, right buttocks wound not visualized Neuro: +generalized weakness, mumbled speech Psych: non-anxious affect, A and O x 3 Hem/lymph/immuno: no widespread bruising   Thank you for the opportunity to participate in the care of Roger Rodriguez.  The palliative care team will continue to follow. Please call our office at [phone] if we can be of additional assistance.   [name], NP   COVID-19 PATIENT SCREENING TOOL Asked and negative response unless otherwise noted:   Have you had symptoms of covid, tested positive or been in contact with someone with symptoms/positive test in the past 5-10 days? No

## 2022-01-21 DIAGNOSIS — G20 Parkinson's disease: Secondary | ICD-10-CM | POA: Diagnosis not present

## 2022-02-04 ENCOUNTER — Ambulatory Visit: Payer: PPO | Admitting: Urology

## 2022-02-04 DIAGNOSIS — N390 Urinary tract infection, site not specified: Secondary | ICD-10-CM | POA: Diagnosis not present

## 2022-02-04 DIAGNOSIS — T83090A Other mechanical complication of cystostomy catheter, initial encounter: Secondary | ICD-10-CM | POA: Diagnosis not present

## 2022-02-04 LAB — MICROSCOPIC EXAMINATION: WBC, UA: >30 /hpf — AB (ref 0–5)

## 2022-02-04 LAB — URINALYSIS, COMPLETE
Bilirubin, UA: NEGATIVE
Glucose, UA: NEGATIVE
Ketones, UA: NEGATIVE
Nitrite, UA: POSITIVE — AB
Specific Gravity, UA: 1.02 (ref 1.005–1.030)
Urobilinogen, Ur: 1 mg/dL (ref 0.2–1.0)
pH, UA: 7 (ref 5.0–7.5)

## 2022-02-04 MED ORDER — RENACIDIN IR SOLN
30.0000 mL | Freq: Once | Status: AC
  Administered 2022-02-04: 30 mL

## 2022-02-04 NOTE — Addendum Note (Signed)
Addended by: Evelina Bucy on: 02/04/2022 02:10 PM   Modules accepted: Orders

## 2022-02-04 NOTE — Progress Notes (Signed)
Suprapubic Cath Change  Patient is present today for a suprapubic catheter change due to urinary retention.  63m of water was drained from the balloon, a 22 FR foley cath was removed from the tract with out difficulty.  Site was cleaned and prepped in a sterile fashion with betadine.  A 22 FR foley cath was replaced into the tract no complications were noted. Urine return was noted, 10 ml of sterile water was inflated into the balloon and a  bag was attached for drainage.  Patient tolerated well. A night bag was given to patient and proper instruction was given on how to switch bags.    Performed by: SZara Council PA-C  Follow up: Care giver states the urine has a stronger odor and having more sediment.  There is also leakage around the SPT site of urine due to sediment buildup.  They are irrigating the catheter daily with vinegar/normal saline.  Renacidin is not covered by their insurance, so we will install renacidin when he comes in for SPT exchange.   UA and urine culture is pending.  We may need to consider gentamicin bladder instillations in the future.   Return in 1 mont for SPT exchange

## 2022-02-08 LAB — CULTURE, URINE COMPREHENSIVE

## 2022-02-09 ENCOUNTER — Other Ambulatory Visit: Payer: Self-pay | Admitting: Family Medicine

## 2022-02-09 ENCOUNTER — Telehealth: Payer: Self-pay

## 2022-02-09 DIAGNOSIS — E119 Type 2 diabetes mellitus without complications: Secondary | ICD-10-CM

## 2022-02-09 MED ORDER — AMOXICILLIN-POT CLAVULANATE 875-125 MG PO TABS: 1.0000 | ORAL_TABLET | Freq: Two times a day (BID) | ORAL | 0 refills | Status: AC

## 2022-02-09 NOTE — Telephone Encounter (Signed)
Notified pt as advised. Sent Abx Rx to pt's pharmacy, pt expressed understanding.

## 2022-02-09 NOTE — Telephone Encounter (Signed)
-----   Message from Nori Riis, PA-C sent at 02/08/2022  8:05 PM EDT ----- Please let Mr. Serviss know that his urine culture was positive and that he needs to start Augmentin 875/125, twice daily x seven days.

## 2022-02-10 NOTE — Telephone Encounter (Signed)
Requested Prescriptions  Pending Prescriptions Disp Refills  . metFORMIN (GLUCOPHAGE) 500 MG tablet [Pharmacy Med Name: METFORMIN HCL 500 MG TABLET] 180 tablet 0    Sig: TAKE 1 TABLET BY MOUTH TWICE A DAY WITH MEALS     Endocrinology:  Diabetes - Biguanides Failed - 02/09/2022  1:52 AM      Failed - HBA1C is between 0 and 7.9 and within 180 days    Hemoglobin A1C  Date Value Ref Range Status  05/28/2021 7.1 (A) 4.0 - 5.6 % Final   Hgb A1c MFr Bld  Date Value Ref Range Status  12/31/2020 7.4 (H) 4.8 - 5.6 % Final    Comment:             Prediabetes: 5.7 - 6.4          Diabetes: >6.4          Glycemic control for adults with diabetes: <7.0          Failed - B12 Level in normal range and within 720 days    Vitamin B-12  Date Value Ref Range Status  08/23/2021 1,066 (H) 180 - 914 pg/mL Final    Comment:    (NOTE) This assay is not validated for testing neonatal or myeloproliferative syndrome specimens for Vitamin B12 levels. Performed at [facility], [place] [address] [place], [place] [address]          Passed - Cr in normal range and within 360 days    Creatinine  Date Value Ref Range Status  04/06/2014 0.94 0.60 - 1.30 mg/dL Final   Creatinine, Ser  Date Value Ref Range Status  08/26/2021 0.85 0.61 - 1.24 mg/dL Final         Passed - eGFR in normal range and within 360 days    EGFR (African American)  Date Value Ref Range Status  04/06/2014 >60 >68m/min Final  04/04/2014 >60  Final   GFR calc Af Amer  Date Value Ref Range Status  08/15/2018 78 >59 mL/min/1.73 Final   EGFR (Non-African Amer.)  Date Value Ref Range Status  04/06/2014 >60 >616mmin Final    Comment:    eGFR values <6087min/1.73 m2 may be an indication of chronic kidney disease (CKD). Calculated eGFR, using the MRDR Study equation, is useful in  patients with stable renal function. The eGFR calculation will not be reliable in acutely ill patients when serum creatinine is changing  rapidly. It is not useful in patients on dialysis. The eGFR calculation may not be applicable to patients at the low and high extremes of body sizes, pregnant women, and vegetarians.   04/04/2014 54 (L)  Final    Comment:    eGFR values <36m99mn/1.73 m2 may be an indication of chronic kidney disease (CKD). Calculated eGFR is useful in patients with stable renal function. The eGFR calculation will not be reliable in acutely ill patients when serum creatinine is changing rapidly. It is not useful in  patients on dialysis. The eGFR calculation may not be applicable to patients at the low and high extremes of body sizes, pregnant women, and vegetarians.    GFR, Estimated  Date Value Ref Range Status  08/26/2021 >60 >60 mL/min Final    Comment:    (NOTE) Calculated using the CKD-EPI Creatinine Equation (2021) Per[facility] [id number]  Date Value Ref Range Status  12/31/2020 88 >59 mL/min/1.73 Final  Passed - Valid encounter within last 6 months    Recent Outpatient Visits          1 month ago Essential (primary) hypertension   [facility] [name]., MD   5 months ago Parkinson's disease [facility])   [facility] Mikey Kirschner, PA-C   6 months ago Pressure injury of contiguous region involving back and buttock, stage 2, unspecified laterality [facility])   [facility] Tally Joe T, FNP   6 months ago Idiopathic Parkinson's disease [facility])   [facility] Rory Percy M, DO   8 months ago Diabetes mellitus without complication [facility])   [facility] [name]., MD      Future Appointments            In 3 weeks McGowan, Hunt Oris, PA-C [facility]           Passed - CBC within normal limits and completed in the last 12 months    WBC  Date Value Ref Range Status  08/24/2021 8.7 4.0 - 10.5  K/uL Final   RBC  Date Value Ref Range Status  08/24/2021 3.83 (L) 4.22 - 5.81 MIL/uL Final   Hemoglobin  Date Value Ref Range Status  08/24/2021 12.3 (L) 13.0 - 17.0 g/dL Final  12/31/2020 14.8 13.0 - 17.7 g/dL Final   HCT  Date Value Ref Range Status  08/24/2021 37.5 (L) 39.0 - 52.0 % Final   Hematocrit  Date Value Ref Range Status  12/31/2020 42.5 37.5 - 51.0 % Final   MCHC  Date Value Ref Range Status  08/24/2021 32.8 30.0 - 36.0 g/dL Final   [facility]  Date Value Ref Range Status  08/24/2021 32.1 26.0 - 34.0 pg Final   MCV  Date Value Ref Range Status  08/24/2021 97.9 80.0 - 100.0 fL Final  12/31/2020 90 79 - 97 fL Final  04/03/2014 95 80 - 100 fL Final   No results found for: "PLTCOUNTKUC", "LABPLAT", "POCPLA" RDW  Date Value Ref Range Status  08/24/2021 14.6 11.5 - 15.5 % Final  12/31/2020 12.5 11.6 - 15.4 % Final  04/03/2014 13.9 11.5 - 14.5 % Final

## 2022-02-21 DIAGNOSIS — G20 Parkinson's disease: Secondary | ICD-10-CM | POA: Diagnosis not present

## 2022-03-03 NOTE — Progress Notes (Unsigned)
Suprapubic Cath Change  Patient is present today for a suprapubic catheter change due to urinary retention.  8 ml of water was drained from the balloon, a 22 FR foley cath was removed from the tract with out difficulty.  Site was cleaned and prepped in a sterile fashion with betadine.  A 22 FR foley cath was replaced into the tract no complications were noted. Urine return was noted, 10 ml of sterile water was inflated into the balloon and a leg  bag was attached for drainage.  Patient tolerated well. A night bag was given to patient and proper instruction was given on how to switch bags.    Performed by: Zara Council, PA-C and Evelina Bucy, CMA  Follow up: His caregiver, Helene Kelp, states that he still have some serosanguineous fluid on the gauze at the SPT site.  This is likely due to granulation tissue.  I addressed it again today with silver nitrate.  They continue to irrigate the catheter daily with vinegar/normal saline.  We instilled with Renacidin today.  They will follow-up in 1 month for SPT exchange.

## 2022-03-04 ENCOUNTER — Ambulatory Visit: Payer: PPO | Admitting: Urology

## 2022-03-04 DIAGNOSIS — Z9359 Other cystostomy status: Secondary | ICD-10-CM | POA: Diagnosis not present

## 2022-03-04 DIAGNOSIS — R339 Retention of urine, unspecified: Secondary | ICD-10-CM

## 2022-03-04 MED ORDER — RENACIDIN IR SOLN
30.0000 mL | Freq: Once | Status: AC
  Administered 2022-03-04: 30 mL

## 2022-03-06 ENCOUNTER — Telehealth: Payer: Self-pay | Admitting: Family Medicine

## 2022-03-06 NOTE — Telephone Encounter (Signed)
Roger Rodriguez Pt's caregiver called asking for an order for a pressure mattress cover with bubbles that blow up so he will not get bed sores.  They want to use [employer] for the order.  [employer] told them to call the doctors office.  CB@  [phone]

## 2022-03-09 NOTE — Telephone Encounter (Signed)
Please advise 

## 2022-03-18 ENCOUNTER — Other Ambulatory Visit: Payer: PPO | Admitting: Student

## 2022-03-18 DIAGNOSIS — R234 Changes in skin texture: Secondary | ICD-10-CM

## 2022-03-18 DIAGNOSIS — G20 Parkinson's disease: Secondary | ICD-10-CM | POA: Diagnosis not present

## 2022-03-18 DIAGNOSIS — S31829A Unspecified open wound of left buttock, initial encounter: Secondary | ICD-10-CM

## 2022-03-18 DIAGNOSIS — Z9359 Other cystostomy status: Secondary | ICD-10-CM

## 2022-03-18 DIAGNOSIS — Z515 Encounter for palliative care: Secondary | ICD-10-CM

## 2022-03-18 DIAGNOSIS — K5900 Constipation, unspecified: Secondary | ICD-10-CM | POA: Diagnosis not present

## 2022-03-18 DIAGNOSIS — R52 Pain, unspecified: Secondary | ICD-10-CM

## 2022-03-18 NOTE — Progress Notes (Signed)
[facility] Pall[facility] Care Consult Note Telephone: [phone]  Fax: [phone]    Date of encounter: 03/18/22 9:51 AM PATIENT NAME: Roger Rodriguez [place] [id number]   [phone] (home)  DOB: 03-25-1941 MRN: [id number] PRIMARY CARE PROVIDER:    [name]., MD,  [address] [place] [address] [phone]  REFERRING PROVIDER:   [name]., MD [address] [place],  [place] [address] [phone]  RESPONSIBLE PARTY:    Contact Information     Name Relation Home Work Collbran Son [phone]     Roger Rodriguez [phone]          I met face to face with patient and family in the home. Palliative Care was asked to follow this patient by consultation request of  Roger Banana.,* to address advance care planning and complex medical decision making. This is a follow up visit.                                   ASSESSMENT AND PLAN / RECOMMENDATIONS:   Advance Care Planning/Goals of Care: Goals include to maximize quality of life and symptom management. Patient/health care surrogate gave his/her permission to discuss. Our advance care planning conversation included a discussion about:    The value and importance of advance care planning  Experiences with loved ones who have been seriously ill or have died  Exploration of personal, cultural or spiritual beliefs that might influence medical decisions  Exploration of goals of care in the event of a sudden injury or illness  CODE STATUS: DNR  Symptom Management/Plan:  Parkinson's disease-patient is dependent for all adl's. He has private caregivers around the clock. His Rytary was changed to carbidopa levodopa recently. He had increased freezing and was changed back to the [place]. Patient is having increased coughing with eating and drinking; education on dysphagia and aspiration.  Recommend sitting up for at least 30-60 minutes after eating and drinking. He is to continue Rytary and amantadine as directed. Follow up with neurology as scheduled.   Constipation-continue diet with fiber, alternating with miralax. Adequate fluids encouraged.   Wound to buttocks-patient with quarter size wound to left buttocks, new sacral fissure. Wound to right buttocks has improved. Recommend using hydrocolloid dressing, change every 3 days and PRN soilage. Recommend HH SN due to wounds.   Suprapubic catheter-patient is having increased difficulty getting to urology appointments. Will request HH SN for suprapubic catheter care/changes monthly.   OA-patient with worsening hand/wrist pain. Recommend Voltaren gel QID.  Follow up Palliative Care Visit: Palliative care will continue to follow for complex medical decision making, advance care planning, and clarification of goals. Return 6-8 weeks or prn.   This visit was coded based on medical decision making (MDM).  PPS: 30%  HOSPICE ELIGIBILITY/DIAGNOSIS: TBD  Chief Complaint: Worsening wounds  HISTORY OF PRESENT ILLNESS:  Roger Rodriguez is a 81 y.o. year old male  with Parkinson's disease, T2DM, OA, CKD, hypertension, depression.    Patient's son Roger Rodriguez and caregiver Roger Rodriguez are present. Patient with worsening wound to left buttocks, noted to be bleeding and larger in size when tegaderm dressing had been removed. This was placed until hydrocolloid dressings arrived per direction of family. Patient also with sacral fissure;he had a dime sized area to right buttocks that has improved. Patient has  hydrocolloid dressings ordered that have not been applied yet as patient was hesitant. Good appetite. Patient is having increased coughing with eating and drinking. He is lying down soon after meals. Occasional constipation. Son Roger Rodriguez states states that they did try to switch to carbidopa levodopa since last palliative visit, but switched back to Rytry  given increased freezing. He endorses hand/wrist pain; using gabapentin and has Voltaren gel. No other symptoms reported. Patient did have day where he was sleepy, more difficulty arousing. Son states that he had been up late watching several ball games. Patient reports feeling his normal self today.    History obtained from review of EMR, discussion with primary team, and interview with family, facility staff/caregiver and/or [name].  I reviewed available labs, medications, imaging, studies and related documents from the EMR.  Records reviewed and summarized above.   ROS  A 10-Point ROS is negative, except for the pertinent positives/negatives detailed per the HPI.   Physical Exam: Pulse 80, resp 20, sats 94-95% on room air Constitutional: NAD General: frail appearing EYES: anicteric sclera, lids intact, no discharge  ENMT: intact hearing, oral mucous membranes moist, dentition intact CV: S1S2, RRR, no LE edema Pulmonary: LCTA, no increased work of breathing, no cough, room air Abdomen: normo-active BS + 4 quadrants, soft and non tender, no ascites GU: clear, yellow urine in drainage bag MSK: non-ambulatory Skin: warm and dry, no rashes, dime size wound to left buttocks, sacral fissure Neuro: +generalized weakness, speech low in tone, mumbled Psych: non-anxious affect, A and O x 3 Hem/lymph/immuno: no widespread bruising   Thank you for the opportunity to participate in the care of [name].  The palliative care team will continue to follow. Please call our office at [phone] if we can be of additional assistance.   [name], NP   COVID-19 PATIENT SCREENING TOOL Asked and negative response unless otherwise noted:   Have you had symptoms of covid, tested positive or been in contact with someone with symptoms/positive test in the past 5-10 days? No

## 2022-03-24 ENCOUNTER — Other Ambulatory Visit: Payer: PPO | Admitting: Student

## 2022-03-24 DIAGNOSIS — G20 Parkinson's disease: Secondary | ICD-10-CM | POA: Diagnosis not present

## 2022-03-26 NOTE — Telephone Encounter (Signed)
Ok to try 

## 2022-03-27 ENCOUNTER — Telehealth: Payer: Self-pay

## 2022-03-27 NOTE — Telephone Encounter (Signed)
1130 am.  Phone call made to patient to follow up [facility] services.  Patient advised he does not have services yet.   Follow up call made to [facility] to check on referral.  They are unable to accept due to staffing.   Referral sent to [facility] for review.

## 2022-03-30 NOTE — Telephone Encounter (Signed)
Caller inquiring on progress of pressure has not heard anything back  re bed reach out at [place] caretaker. States pt has a bedsore on bottom now.

## 2022-03-31 ENCOUNTER — Telehealth: Payer: Self-pay

## 2022-03-31 NOTE — Telephone Encounter (Signed)
See other message

## 2022-03-31 NOTE — Telephone Encounter (Signed)
Copied from [place] [phone]. Topic: General - Inquiry >> Mar 31, 2022  3:10 PM Devoria Glassing wrote: Reason for CRM: Helene Kelp calling back and asked the mattress be sent to [facility]. Please call her back.  She has been waiting.

## 2022-03-31 NOTE — Telephone Encounter (Addendum)
Roger Rodriguez Pt's caregiver called in to follow up. Per the notes below, advised the order was faxed. Helene Kelp was upset that she did not receive a call back. Helene Kelp stated she would give it a few days and reach back out to [place].    Please advise.

## 2022-03-31 NOTE — Telephone Encounter (Signed)
Order written and faxed.

## 2022-04-01 NOTE — Progress Notes (Unsigned)
Suprapubic Cath Change  Patient is present today for a suprapubic catheter change due to urinary retention.  8 ml of water was drained from the balloon, a 22 FR foley cath was removed from the tract with out difficulty.  Site was cleaned and prepped in a sterile fashion with betadine.  A 22 FR foley cath was replaced into the tract no complications were noted. Urine return was noted, 10 ml of sterile water was inflated into the balloon and a leg bag was attached for drainage.  Patient tolerated well. A night bag was given to patient and proper instruction was given on how to switch bags.    Performed by: Michiel Cowboy, PA-C and Crysta S. Albright, CMA   Follow up: His urine was cloudy and slimy, so they gave him Cipro for 250 twice daily for 7 days that they had on hand and then the urine cleared up.  I did explain that Cipro was not ideal to use as a "routine" antibiotic as it has some significant side effects.  I also discussed colonization and the challenge it gives [place] when dealing with indwelling catheters.  His urine looked great today, so I explained that there was no need to check his urine again today.  I did instill 30 mL to run a side in and addressed the small granuloma tissue with silver nitrate.  One month for SPT exchange.

## 2022-04-02 ENCOUNTER — Ambulatory Visit: Payer: PPO | Admitting: Urology

## 2022-04-02 DIAGNOSIS — Z9359 Other cystostomy status: Secondary | ICD-10-CM

## 2022-04-02 DIAGNOSIS — R339 Retention of urine, unspecified: Secondary | ICD-10-CM | POA: Diagnosis not present

## 2022-04-05 ENCOUNTER — Other Ambulatory Visit: Payer: Self-pay | Admitting: Family Medicine

## 2022-04-14 ENCOUNTER — Telehealth: Payer: Self-pay | Admitting: *Deleted

## 2022-04-14 ENCOUNTER — Telehealth: Payer: Self-pay

## 2022-04-14 ENCOUNTER — Other Ambulatory Visit: Payer: Self-pay | Admitting: *Deleted

## 2022-04-14 DIAGNOSIS — N390 Urinary tract infection, site not specified: Secondary | ICD-10-CM

## 2022-04-14 NOTE — Telephone Encounter (Signed)
Called [facility] customer support center to follow up on order that was faxed by April Miller CMA on 04/01/2022. Per Joy with customer service  Phone [phone]-they do not have anything on this patient. Verify fax number and the fax number was incorrect on the prescription that was previously sent.  They gave me another fax number to fax prescription [phone]. Per Caryl Asp it will take 24 hours to process the order. Order reprinted and faxed to correct fax number.

## 2022-04-14 NOTE — Telephone Encounter (Signed)
Spoke with patient's caregiver Helene Kelp and explained the situation about the order. Will follow-up with [facility] tomorrow and will call patient's caregiver with updates.Caregiver stated that it would have been nice if it was followed from the beginning.

## 2022-04-14 NOTE — Telephone Encounter (Signed)
Copied from [place] [phone]. Topic: General - Other >> Apr 14, 2022  9:27 AM Cyndi Bender wrote: Reason for CRM: Pt caretaker Helene Kelp stated pt has sores on his bottom and it has been over 2 weeks since she was told that someone would return her call. Helene Kelp requests call back asap at [phone] regarding the order for a mattress cover that was approved by Dr. Rosanna Randy.

## 2022-04-14 NOTE — Telephone Encounter (Signed)
Patient caregiver called and states he is having i dark urine and weak , burning and pain in peins . they want to bring in a sample. Larene [place] states that okay

## 2022-04-15 ENCOUNTER — Telehealth: Payer: Self-pay | Admitting: Urology

## 2022-04-15 ENCOUNTER — Other Ambulatory Visit: Payer: PPO

## 2022-04-15 DIAGNOSIS — N390 Urinary tract infection, site not specified: Secondary | ICD-10-CM

## 2022-04-15 DIAGNOSIS — G20C Parkinsonism, unspecified: Secondary | ICD-10-CM | POA: Diagnosis not present

## 2022-04-15 MED ORDER — NITROFURANTOIN MONOHYD MACRO 100 MG PO CAPS: 100.0000 mg | ORAL_CAPSULE | Freq: Two times a day (BID) | ORAL | 0 refills | Status: DC

## 2022-04-15 NOTE — Telephone Encounter (Signed)
Roger Rodriguez urine specimen is being sent for culture.  We will not the results until next week, so I would like for him to get started on Macrobid 100 mg, twice daily for seven days.

## 2022-04-15 NOTE — Telephone Encounter (Signed)
Notified pt as advised, Abx sent to pt's pharmacy, pt expressed understanding.

## 2022-04-16 LAB — URINALYSIS, COMPLETE

## 2022-04-16 LAB — MICROSCOPIC EXAMINATION
RBC, Urine: >30 /hpf — AB (ref 0–2)
WBC, UA: >30 /hpf — AB (ref 0–5)

## 2022-04-16 NOTE — Telephone Encounter (Signed)
[place] and verified that order was received.  Per [place] Patient confirmed delivery address. They moved the delivery date to the 10/04 (yesterday).

## 2022-04-19 ENCOUNTER — Encounter: Payer: Self-pay | Admitting: Emergency Medicine

## 2022-04-19 ENCOUNTER — Other Ambulatory Visit: Payer: Self-pay

## 2022-04-19 ENCOUNTER — Emergency Department: Payer: PPO

## 2022-04-19 ENCOUNTER — Inpatient Hospital Stay: Payer: PPO

## 2022-04-19 ENCOUNTER — Inpatient Hospital Stay
Admission: EM | Admit: 2022-04-19 | Discharge: 2022-04-22 | DRG: 698 | Disposition: A | Discharge status: Hospice | Payer: PPO | Attending: Hospitalist | Admitting: Hospitalist

## 2022-04-19 DIAGNOSIS — F419 Anxiety disorder, unspecified: Secondary | ICD-10-CM | POA: Diagnosis present

## 2022-04-19 DIAGNOSIS — R4182 Altered mental status, unspecified: Secondary | ICD-10-CM | POA: Diagnosis not present

## 2022-04-19 DIAGNOSIS — J9811 Atelectasis: Secondary | ICD-10-CM | POA: Diagnosis not present

## 2022-04-19 DIAGNOSIS — Z515 Encounter for palliative care: Secondary | ICD-10-CM

## 2022-04-19 DIAGNOSIS — Z20822 Contact with and (suspected) exposure to covid-19: Secondary | ICD-10-CM | POA: Diagnosis not present

## 2022-04-19 DIAGNOSIS — Z7984 Long term (current) use of oral hypoglycemic drugs: Secondary | ICD-10-CM

## 2022-04-19 DIAGNOSIS — F32A Depression, unspecified: Secondary | ICD-10-CM | POA: Diagnosis present

## 2022-04-19 DIAGNOSIS — R1310 Dysphagia, unspecified: Secondary | ICD-10-CM | POA: Diagnosis not present

## 2022-04-19 DIAGNOSIS — Z8616 Personal history of COVID-19: Secondary | ICD-10-CM | POA: Diagnosis not present

## 2022-04-19 DIAGNOSIS — Z9359 Other cystostomy status: Secondary | ICD-10-CM | POA: Diagnosis not present

## 2022-04-19 DIAGNOSIS — M19012 Primary osteoarthritis, left shoulder: Secondary | ICD-10-CM | POA: Diagnosis not present

## 2022-04-19 DIAGNOSIS — D72829 Elevated white blood cell count, unspecified: Secondary | ICD-10-CM | POA: Diagnosis present

## 2022-04-19 DIAGNOSIS — Z882 Allergy status to sulfonamides status: Secondary | ICD-10-CM

## 2022-04-19 DIAGNOSIS — F321 Major depressive disorder, single episode, moderate: Secondary | ICD-10-CM | POA: Diagnosis not present

## 2022-04-19 DIAGNOSIS — J9601 Acute respiratory failure with hypoxia: Secondary | ICD-10-CM | POA: Diagnosis present

## 2022-04-19 DIAGNOSIS — Z87442 Personal history of urinary calculi: Secondary | ICD-10-CM

## 2022-04-19 DIAGNOSIS — T83511A Infection and inflammatory reaction due to indwelling urethral catheter, initial encounter: Principal | ICD-10-CM | POA: Diagnosis present

## 2022-04-19 DIAGNOSIS — Z800 Family history of malignant neoplasm of digestive organs: Secondary | ICD-10-CM

## 2022-04-19 DIAGNOSIS — R29898 Other symptoms and signs involving the musculoskeletal system: Secondary | ICD-10-CM | POA: Diagnosis not present

## 2022-04-19 DIAGNOSIS — I10 Essential (primary) hypertension: Secondary | ICD-10-CM | POA: Diagnosis present

## 2022-04-19 DIAGNOSIS — A419 Sepsis, unspecified organism: Secondary | ICD-10-CM | POA: Diagnosis not present

## 2022-04-19 DIAGNOSIS — E785 Hyperlipidemia, unspecified: Secondary | ICD-10-CM | POA: Diagnosis present

## 2022-04-19 DIAGNOSIS — M47816 Spondylosis without myelopathy or radiculopathy, lumbar region: Secondary | ICD-10-CM | POA: Diagnosis present

## 2022-04-19 DIAGNOSIS — E119 Type 2 diabetes mellitus without complications: Secondary | ICD-10-CM | POA: Diagnosis not present

## 2022-04-19 DIAGNOSIS — Z8719 Personal history of other diseases of the digestive system: Secondary | ICD-10-CM

## 2022-04-19 DIAGNOSIS — N200 Calculus of kidney: Secondary | ICD-10-CM | POA: Diagnosis not present

## 2022-04-19 DIAGNOSIS — R531 Weakness: Secondary | ICD-10-CM | POA: Diagnosis not present

## 2022-04-19 DIAGNOSIS — R918 Other nonspecific abnormal finding of lung field: Secondary | ICD-10-CM | POA: Diagnosis not present

## 2022-04-19 DIAGNOSIS — M47818 Spondylosis without myelopathy or radiculopathy, sacral and sacrococcygeal region: Secondary | ICD-10-CM | POA: Diagnosis present

## 2022-04-19 DIAGNOSIS — G20A1 Parkinson's disease without dyskinesia, without mention of fluctuations: Secondary | ICD-10-CM | POA: Diagnosis present

## 2022-04-19 DIAGNOSIS — Z820 Family history of epilepsy and other diseases of the nervous system: Secondary | ICD-10-CM

## 2022-04-19 DIAGNOSIS — Z0389 Encounter for observation for other suspected diseases and conditions ruled out: Secondary | ICD-10-CM | POA: Diagnosis not present

## 2022-04-19 DIAGNOSIS — Z634 Disappearance and death of family member: Secondary | ICD-10-CM

## 2022-04-19 DIAGNOSIS — K760 Fatty (change of) liver, not elsewhere classified: Secondary | ICD-10-CM | POA: Diagnosis not present

## 2022-04-19 DIAGNOSIS — K219 Gastro-esophageal reflux disease without esophagitis: Secondary | ICD-10-CM | POA: Diagnosis present

## 2022-04-19 DIAGNOSIS — N3000 Acute cystitis without hematuria: Secondary | ICD-10-CM | POA: Diagnosis not present

## 2022-04-19 DIAGNOSIS — Z7401 Bed confinement status: Secondary | ICD-10-CM

## 2022-04-19 DIAGNOSIS — R6520 Severe sepsis without septic shock: Secondary | ICD-10-CM | POA: Diagnosis present

## 2022-04-19 DIAGNOSIS — Z23 Encounter for immunization: Secondary | ICD-10-CM

## 2022-04-19 DIAGNOSIS — M722 Plantar fascial fibromatosis: Secondary | ICD-10-CM | POA: Diagnosis present

## 2022-04-19 DIAGNOSIS — Z79899 Other long term (current) drug therapy: Secondary | ICD-10-CM

## 2022-04-19 DIAGNOSIS — R404 Transient alteration of awareness: Secondary | ICD-10-CM | POA: Diagnosis not present

## 2022-04-19 DIAGNOSIS — Z87891 Personal history of nicotine dependence: Secondary | ICD-10-CM

## 2022-04-19 DIAGNOSIS — R9431 Abnormal electrocardiogram [ECG] [EKG]: Secondary | ICD-10-CM | POA: Diagnosis not present

## 2022-04-19 DIAGNOSIS — G2581 Restless legs syndrome: Secondary | ICD-10-CM | POA: Diagnosis not present

## 2022-04-19 DIAGNOSIS — Z66 Do not resuscitate: Secondary | ICD-10-CM | POA: Diagnosis present

## 2022-04-19 DIAGNOSIS — Z8052 Family history of malignant neoplasm of bladder: Secondary | ICD-10-CM

## 2022-04-19 DIAGNOSIS — Z8673 Personal history of transient ischemic attack (TIA), and cerebral infarction without residual deficits: Secondary | ICD-10-CM

## 2022-04-19 DIAGNOSIS — N281 Cyst of kidney, acquired: Secondary | ICD-10-CM | POA: Diagnosis not present

## 2022-04-19 DIAGNOSIS — R55 Syncope and collapse: Secondary | ICD-10-CM | POA: Diagnosis not present

## 2022-04-19 DIAGNOSIS — Z7189 Other specified counseling: Secondary | ICD-10-CM | POA: Diagnosis not present

## 2022-04-19 DIAGNOSIS — R000 Tachycardia, unspecified: Secondary | ICD-10-CM | POA: Diagnosis not present

## 2022-04-19 DIAGNOSIS — Z8546 Personal history of malignant neoplasm of prostate: Secondary | ICD-10-CM

## 2022-04-19 DIAGNOSIS — G319 Degenerative disease of nervous system, unspecified: Secondary | ICD-10-CM | POA: Diagnosis not present

## 2022-04-19 DIAGNOSIS — R339 Retention of urine, unspecified: Secondary | ICD-10-CM | POA: Diagnosis not present

## 2022-04-19 DIAGNOSIS — R0902 Hypoxemia: Secondary | ICD-10-CM | POA: Diagnosis not present

## 2022-04-19 DIAGNOSIS — I872 Venous insufficiency (chronic) (peripheral): Secondary | ICD-10-CM | POA: Diagnosis present

## 2022-04-19 DIAGNOSIS — Z8249 Family history of ischemic heart disease and other diseases of the circulatory system: Secondary | ICD-10-CM

## 2022-04-19 DIAGNOSIS — Z96651 Presence of right artificial knee joint: Secondary | ICD-10-CM

## 2022-04-19 DIAGNOSIS — M19011 Primary osteoarthritis, right shoulder: Secondary | ICD-10-CM | POA: Diagnosis not present

## 2022-04-19 DIAGNOSIS — J984 Other disorders of lung: Secondary | ICD-10-CM | POA: Diagnosis not present

## 2022-04-19 DIAGNOSIS — R54 Age-related physical debility: Secondary | ICD-10-CM | POA: Diagnosis present

## 2022-04-19 DIAGNOSIS — R0689 Other abnormalities of breathing: Secondary | ICD-10-CM | POA: Diagnosis not present

## 2022-04-19 DIAGNOSIS — R5383 Other fatigue: Secondary | ICD-10-CM | POA: Diagnosis present

## 2022-04-19 DIAGNOSIS — Z791 Long term (current) use of non-steroidal anti-inflammatories (NSAID): Secondary | ICD-10-CM

## 2022-04-19 DIAGNOSIS — Z85820 Personal history of malignant melanoma of skin: Secondary | ICD-10-CM

## 2022-04-19 LAB — CBC WITH DIFFERENTIAL/PLATELET
Abs Immature Granulocytes: 0.05 10*3/uL (ref 0.00–0.07)
Basophils Absolute: 0 10*3/uL (ref 0.0–0.1)
Basophils Relative: 0 %
Eosinophils Absolute: 0 10*3/uL (ref 0.0–0.5)
Eosinophils Relative: 0 %
HCT: 48.9 % (ref 39.0–52.0)
Hemoglobin: 16.1 g/dL (ref 13.0–17.0)
Immature Granulocytes: 0 %
Lymphocytes Relative: 9 %
Lymphs Abs: 1 10*3/uL (ref 0.7–4.0)
MCH: 30.9 pg (ref 26.0–34.0)
MCHC: 32.9 g/dL (ref 30.0–36.0)
MCV: 93.9 fL (ref 80.0–100.0)
Monocytes Absolute: 1 10*3/uL (ref 0.1–1.0)
Monocytes Relative: 9 %
Neutro Abs: 9.7 10*3/uL — ABNORMAL HIGH (ref 1.7–7.7)
Neutrophils Relative %: 82 %
Platelets: 231 10*3/uL (ref 150–400)
RBC: 5.21 MIL/uL (ref 4.22–5.81)
RDW: 13.8 % (ref 11.5–15.5)
WBC: 11.8 10*3/uL — ABNORMAL HIGH (ref 4.0–10.5)
nRBC: 0 % (ref 0.0–0.2)

## 2022-04-19 LAB — RESP PANEL BY RT-PCR (FLU A&B, COVID) ARPGX2
Influenza A by PCR: NEGATIVE
Influenza B by PCR: NEGATIVE
SARS Coronavirus 2 by RT PCR: NEGATIVE

## 2022-04-19 LAB — URINALYSIS, COMPLETE (UACMP) WITH MICROSCOPIC
Bilirubin Urine: NEGATIVE
Glucose, UA: NEGATIVE mg/dL
Ketones, ur: 20 mg/dL — AB
Nitrite: NEGATIVE
Protein, ur: NEGATIVE mg/dL
RBC / HPF: >50 RBC/hpf — ABNORMAL HIGH (ref 0–5)
Specific Gravity, Urine: 1.017 (ref 1.005–1.030)
Squamous Epithelial / HPF: NONE SEEN (ref 0–5)
WBC, UA: >50 WBC/hpf — ABNORMAL HIGH (ref 0–5)
pH: 5 (ref 5.0–8.0)

## 2022-04-19 LAB — TROPONIN I (HIGH SENSITIVITY)
Troponin I (High Sensitivity): 17 ng/L (ref <18)
Troponin I (High Sensitivity): 36 ng/L — ABNORMAL HIGH (ref <18)

## 2022-04-19 LAB — COMPREHENSIVE METABOLIC PANEL
ALT: <5 U/L (ref 0–44)
AST: 11 U/L — ABNORMAL LOW (ref 15–41)
Albumin: 3.8 g/dL (ref 3.5–5.0)
Alkaline Phosphatase: 56 U/L (ref 38–126)
Anion gap: 13 (ref 5–15)
BUN: 24 mg/dL — ABNORMAL HIGH (ref 8–23)
CO2: 22 mmol/L (ref 22–32)
Calcium: 8.3 mg/dL — ABNORMAL LOW (ref 8.9–10.3)
Chloride: 100 mmol/L (ref 98–111)
Creatinine, Ser: 1.01 mg/dL (ref 0.61–1.24)
GFR, Estimated: >60 mL/min (ref >60)
Glucose, Bld: 171 mg/dL — ABNORMAL HIGH (ref 70–99)
Potassium: 3.7 mmol/L (ref 3.5–5.1)
Sodium: 135 mmol/L (ref 135–145)
Total Bilirubin: 1.5 mg/dL — ABNORMAL HIGH (ref 0.3–1.2)
Total Protein: 7.9 g/dL (ref 6.5–8.1)

## 2022-04-19 LAB — BLOOD GAS, VENOUS
Acid-base deficit: 0.5 mmol/L (ref 0.0–2.0)
Bicarbonate: 24.2 mmol/L (ref 20.0–28.0)
O2 Saturation: 85.3 %
Patient temperature: 37
pCO2, Ven: 39 mmHg — ABNORMAL LOW (ref 44–60)
pH, Ven: 7.4 (ref 7.25–7.43)
pO2, Ven: 54 mmHg — ABNORMAL HIGH (ref 32–45)

## 2022-04-19 LAB — PROCALCITONIN: Procalcitonin: <0.1 ng/mL

## 2022-04-19 LAB — PROTIME-INR
INR: 1.2 (ref 0.8–1.2)
Prothrombin Time: 15 seconds (ref 11.4–15.2)

## 2022-04-19 LAB — CBG MONITORING, ED
Glucose-Capillary: 175 mg/dL — ABNORMAL HIGH (ref 70–99)
Glucose-Capillary: 141 mg/dL — ABNORMAL HIGH (ref 70–99)

## 2022-04-19 LAB — MRSA NEXT GEN BY PCR, NASAL: MRSA by PCR Next Gen: NOT DETECTED

## 2022-04-19 LAB — LACTIC ACID, PLASMA
Lactic Acid, Venous: 1.8 mmol/L (ref 0.5–1.9)
Lactic Acid, Venous: 1.6 mmol/L (ref 0.5–1.9)

## 2022-04-19 LAB — APTT: aPTT: 32 seconds (ref 24–36)

## 2022-04-19 LAB — D-DIMER, QUANTITATIVE: D-Dimer, Quant: 3.75 ug/mL-FEU — ABNORMAL HIGH (ref 0.00–0.50)

## 2022-04-19 MED ORDER — LACTATED RINGERS IV BOLUS
1000.0000 mL | Freq: Once | INTRAVENOUS | Status: AC
  Administered 2022-04-19: 1000 mL via INTRAVENOUS

## 2022-04-19 MED ORDER — SODIUM CHLORIDE 0.9 % IV SOLN
2.0000 g | Freq: Three times a day (TID) | INTRAVENOUS | Status: DC
  Administered 2022-04-19 – 2022-04-20 (×3): 2 g via INTRAVENOUS
  Filled 2022-04-19 (×4): qty 12.5

## 2022-04-19 MED ORDER — ENOXAPARIN SODIUM 40 MG/0.4ML IJ SOSY
40.0000 mg | PREFILLED_SYRINGE | INTRAMUSCULAR | Status: DC
  Administered 2022-04-20 – 2022-04-21 (×2): 40 mg via SUBCUTANEOUS
  Filled 2022-04-19 (×2): qty 0.4

## 2022-04-19 MED ORDER — ACETAMINOPHEN 325 MG RE SUPP
650.0000 mg | Freq: Once | RECTAL | Status: AC
  Administered 2022-04-19: 650 mg via RECTAL
  Filled 2022-04-19: qty 2

## 2022-04-19 MED ORDER — GABAPENTIN 100 MG PO CAPS
100.0000 mg | ORAL_CAPSULE | Freq: Three times a day (TID) | ORAL | Status: DC
  Administered 2022-04-20 – 2022-04-22 (×7): 100 mg via ORAL
  Filled 2022-04-19 (×7): qty 1

## 2022-04-19 MED ORDER — ACETAMINOPHEN 325 MG PO TABS
650.0000 mg | ORAL_TABLET | Freq: Four times a day (QID) | ORAL | Status: DC | PRN
  Administered 2022-04-20: 650 mg via ORAL
  Filled 2022-04-19 (×2): qty 2

## 2022-04-19 MED ORDER — INSULIN ASPART 100 UNIT/ML IJ SOLN: 0.0000 [IU] | Freq: Every day | INTRAMUSCULAR | Status: DC

## 2022-04-19 MED ORDER — ACETAMINOPHEN 650 MG RE SUPP: 650.0000 mg | Freq: Four times a day (QID) | RECTAL | Status: DC | PRN

## 2022-04-19 MED ORDER — CARBIDOPA-LEVODOPA ER 23.75-95 MG PO CPCR
1.0000 | ORAL_CAPSULE | Freq: Four times a day (QID) | ORAL | Status: DC
  Administered 2022-04-20 (×3): 1 via ORAL
  Filled 2022-04-19 (×14): qty 100

## 2022-04-19 MED ORDER — SODIUM CHLORIDE 0.9 % IV SOLN
2.0000 g | Freq: Once | INTRAVENOUS | Status: AC
  Administered 2022-04-19: 2 g via INTRAVENOUS
  Filled 2022-04-19: qty 12.5

## 2022-04-19 MED ORDER — VANCOMYCIN HCL 1750 MG/350ML IV SOLN
1750.0000 mg | INTRAVENOUS | Status: DC
  Filled 2022-04-19: qty 350

## 2022-04-19 MED ORDER — CARBIDOPA-LEVODOPA ER 61.25-245 MG PO CPCR
1.0000 | ORAL_CAPSULE | Freq: Four times a day (QID) | ORAL | Status: DC
  Administered 2022-04-20 – 2022-04-22 (×9): 1 via ORAL
  Filled 2022-04-19 (×13): qty 100

## 2022-04-19 MED ORDER — INSULIN ASPART 100 UNIT/ML IJ SOLN
0.0000 [IU] | Freq: Three times a day (TID) | INTRAMUSCULAR | Status: DC
  Administered 2022-04-19 – 2022-04-21 (×2): 2 [IU] via SUBCUTANEOUS
  Administered 2022-04-21: 3 [IU] via SUBCUTANEOUS
  Filled 2022-04-19 (×3): qty 1

## 2022-04-19 MED ORDER — METRONIDAZOLE 500 MG/100ML IV SOLN
500.0000 mg | Freq: Two times a day (BID) | INTRAVENOUS | Status: DC
  Administered 2022-04-20 (×2): 500 mg via INTRAVENOUS
  Filled 2022-04-19 (×2): qty 100

## 2022-04-19 MED ORDER — ONDANSETRON HCL 4 MG/2ML IJ SOLN: 4.0000 mg | Freq: Four times a day (QID) | INTRAMUSCULAR | Status: DC | PRN

## 2022-04-19 MED ORDER — SENNOSIDES-DOCUSATE SODIUM 8.6-50 MG PO TABS: 1.0000 | ORAL_TABLET | Freq: Every evening | ORAL | Status: DC | PRN

## 2022-04-19 MED ORDER — ONDANSETRON HCL 4 MG PO TABS: 4.0000 mg | ORAL_TABLET | Freq: Four times a day (QID) | ORAL | Status: DC | PRN

## 2022-04-19 MED ORDER — VANCOMYCIN HCL IN DEXTROSE 1-5 GM/200ML-% IV SOLN
1000.0000 mg | Freq: Once | INTRAVENOUS | Status: AC
  Administered 2022-04-19: 1000 mg via INTRAVENOUS
  Filled 2022-04-19: qty 200

## 2022-04-19 MED ORDER — SODIUM CHLORIDE 0.9 % IV BOLUS
500.0000 mL | Freq: Once | INTRAVENOUS | Status: AC
  Administered 2022-04-19: 500 mL via INTRAVENOUS

## 2022-04-19 MED ORDER — SODIUM CHLORIDE 0.9 % IV SOLN: INTRAVENOUS | Status: DC

## 2022-04-19 MED ORDER — PRAMIPEXOLE DIHYDROCHLORIDE 0.25 MG PO TABS
0.2500 mg | ORAL_TABLET | Freq: Three times a day (TID) | ORAL | Status: DC
  Administered 2022-04-20 – 2022-04-22 (×7): 0.25 mg via ORAL
  Filled 2022-04-19 (×8): qty 1

## 2022-04-19 MED ORDER — METRONIDAZOLE 500 MG/100ML IV SOLN
500.0000 mg | Freq: Once | INTRAVENOUS | Status: AC
  Administered 2022-04-19: 500 mg via INTRAVENOUS
  Filled 2022-04-19: qty 100

## 2022-04-19 MED ORDER — AMANTADINE HCL 100 MG PO CAPS
100.0000 mg | ORAL_CAPSULE | Freq: Every day | ORAL | Status: DC
  Administered 2022-04-20 – 2022-04-22 (×3): 100 mg via ORAL
  Filled 2022-04-19 (×3): qty 1

## 2022-04-19 NOTE — ED Notes (Signed)
Pt transported MRI at this time.

## 2022-04-19 NOTE — ED Triage Notes (Addendum)
Pt via EMS from home. Pt is being treated for a UTI and currently taking abx. Family found patient this AM unresponsive and hard for him to arouse which is not normal. Family last seen patient normal last night. Pt does have a hx of Parkinson's Disease and not ambulatory at baseline. Pt also has a suprapubic catheter. Pt is responsive to painful stimuli and sudden movements. EMS reports 103 temp ax, ETCO of 14, HR in the 120s. Pt RA sat 87% on RA, placed on 2L [place]. EMS gave 546m of fluid.

## 2022-04-19 NOTE — Consult Note (Signed)
Pharmacy Antibiotic Note  Roger Rodriguez is a 81 y.o. male admitted on 04/19/2022 with pneumonia, sepsis, and UTI.  Pharmacy has been consulted for vancomycin and cefepime dosing.  Assessment: 81 yo M with PMH Parkinson's and suprapubic catheter who comes in with concerns for sepsis, thought to be potentially secondary to UTI with aspiration pneumonia given patchy opacities on CXR, favored to reflect atelectasis. Older Ucx from 10/4 growing >100,000 CFU/mL GNRs and was prescribed nitrofurantoin. Pt was febrile with EMS and hypoxic, placed on 2 L with EMS. Pt currently febrile (Tmax 102.7) with tachycardia and tachypnea. Lactic acid 1.6. Pt currently satting 94% on RA. Pt received vancomycin 1 g x 1 and cefepime 2 g x 1 in the ED.  Vancomycin 1750 mg IV q24H: Goal AUC 400-550  Est AUC: 505.6; Cmax: 38.2; Cmin: 11.2 SCr 1.01; IBW; Vd 0.72  F/u MRSA PCR ordered & cultures.   Plan: Complete vancomycin load with additional vancomycin1 g x 1 (total loading dose = 2 g) Initiate vancomycin 1750 mg IV q24H Initiate cefepime 2 g IV q8H Monitor renal function to assess for any necessary changes in antibiotic regimen Monitor culture results for potential antimicrobial de-escalation  Height: 6' (182.9 cm) Weight: 86.2 kg (190 lb) IBW/kg (Calculated) : 77.6  Temp (24hrs), Avg:101.4 F (38.6 C), Min:100.1 F (37.8 C), Max:102.7 F (39.3 C)  Recent Labs  Lab 04/19/22 1127 04/19/22 1326  WBC 11.8*  --   CREATININE 1.01  --   LATICACIDVEN 1.8 1.6    Estimated Creatinine Clearance: 61.9 mL/min (by C-G formula based on SCr of 1.01 mg/dL).    Allergies  Allergen Reactions   Sulfa Antibiotics Swelling    Lip swelling    Antimicrobials this admission: vancomycin 10/8 >>  cefepime 10/8 >>  metronidazole 10/8 >>  Dose adjustments this admission: N/A  Microbiology results: 10/8 BCx: collected 10/8 UCx: collected 10/8 MRSA PCR: ordered  Thank you for allowing pharmacy to be a part of  this patient's care.  Dara Hoyer, PharmD PGY-1 Pharmacy Resident 04/19/2022 2:24 PM

## 2022-04-19 NOTE — Assessment & Plan Note (Deleted)
-   Patient takes buspirone 7.5 mg p.o. twice daily   **    **

## 2022-04-19 NOTE — Progress Notes (Signed)
CODE SEPSIS - PHARMACY COMMUNICATION  **Broad Spectrum Antibiotics should be administered within 1 hour of Sepsis diagnosis**  Time Code Sepsis Called/Page Received: 1117  Antibiotics Ordered: Cefepime + vancomycin + metronidazole  Time of 1st antibiotic administration: 1134  Additional action taken by pharmacy: N/A  Benita Gutter 04/19/2022  11:25 AM

## 2022-04-19 NOTE — Assessment & Plan Note (Signed)
-  Patient met criteria for severe sepsis with fever, sinus tachycardia, leukocytosis, source of urine and new oxygen requirement - Acute hypoxic respiratory failure - Blood cultures x2 have been collected and are in process, urine cultures in process - Procalcitonin in process - Status post cefepime, vancomycin, metronidazole and 2 L LR bolus per EDP - Additional sodium chloride 500 mL bolus to complete sepsis bolus on admission ordered - Sodium chloride 125 mL/h maintenance fluid ordered, 1 day - Continue with broad-spectrum antibiotic, cefepime, metronidazole, vancomycin

## 2022-04-19 NOTE — Assessment & Plan Note (Signed)
-   Patient takes metformin 500 mg p.o. twice daily, this has not been resumed on admission - Insulin SSI with agents coverage ordered - Goal inpatient blood glucose levels 140-100 and

## 2022-04-19 NOTE — ED Provider Notes (Signed)
[facility] Provider Note    Event Date/Time   First MD Initiated Contact with Patient 04/19/22 1128     (approximate)   History   Code Sepsis   HPI  Roger Rodriguez is a 81 y.o. male with Parkinson's and suprapubic catheter who comes in with concerns for sepsis.  Patient is currently on antibiotics for possible UTI.  Patient was noted this morning to be difficult awakening therefore EMS was called.  Patient had a fever of 103 with EMS.  Patient minimally responsive to pain.  Patient was hypoxic to 88% placed on 2 L.   Physical Exam   Triage Vital Signs: ED Triage Vitals  Enc Vitals Group     BP 04/19/22 1133 (!) 140/77     Pulse Rate 04/19/22 1133 (!) 113     Resp 04/19/22 1133 20     Temp 04/19/22 1133 (!) 102.7 F (39.3 C)     Temp Source 04/19/22 1133 Rectal     SpO2 04/19/22 1133 (!) 88 %     Weight 04/19/22 1144 190 lb (86.2 kg)     Height 04/19/22 1144 6' (1.829 m)     Head Circumference --      Peak Flow --      Pain Score --      Pain Loc --      Pain Edu? --      Excl. in [place]? --     Most recent vital signs: Vitals:   04/19/22 1133 04/19/22 1145  BP: (!) 140/77   Pulse: (!) 113 (!) 112  Resp: 20 19  Temp: (!) 102.7 F (39.3 C)   SpO2: (!) 88% 90%     General: Patient is able to moan and withdraw slightly to pain but otherwise has been nonverbal. CV:  Good peripheral perfusion.  Resp:  Normal effort.  Abd:  No distention.  Other:     ED Results / Procedures / Treatments   Labs (all labs ordered are listed, but only abnormal results are displayed) Labs Reviewed  BLOOD GAS, VENOUS - Abnormal; Notable for the following components:      Result Value   pCO2, Ven 39 (*)    pO2, Ven 54 (*)    All other components within normal limits  RESP PANEL BY RT-PCR (FLU A&B, COVID) ARPGX2  CULTURE, BLOOD (ROUTINE X 2)  CULTURE, BLOOD (ROUTINE X 2)  URINE CULTURE  LACTIC ACID, PLASMA  LACTIC ACID, PLASMA  COMPREHENSIVE  METABOLIC PANEL  CBC WITH DIFFERENTIAL/PLATELET  PROTIME-INR  APTT  URINALYSIS, COMPLETE (UACMP) WITH MICROSCOPIC  PROCALCITONIN  CBG MONITORING, ED  TROPONIN I (HIGH SENSITIVITY)     EKG  My interpretation of EKG:  Sinus versus atrial flutter with a rate of 113 without any ST elevation, T wave inversion in lead III, normal intervals  RADIOLOGY I have reviewed the xray personally and interpreted no evidence of any pneumonia   PROCEDURES:  Critical Care performed: Yes, see critical care procedure note(s)  .1-3 Lead EKG Interpretation  Performed by: Vanessa [place], MD Authorized by: Vanessa [place], MD     Interpretation: normal     ECG rate:  110   ECG rate assessment: normal     Rhythm: sinus rhythm     Ectopy: none     Conduction: normal   .Critical Care  Performed by: Vanessa [place], MD Authorized by: Vanessa [place], MD   Critical care provider statement:  Critical care time (minutes):  30   Critical care was necessary to treat or prevent imminent or life-threatening deterioration of the following conditions:  Sepsis   Critical care was time spent personally by me on the following activities:  Development of treatment plan with patient or surrogate, discussions with consultants, evaluation of patient's response to treatment, examination of patient, ordering and review of laboratory studies, ordering and review of radiographic studies, ordering and performing treatments and interventions, pulse oximetry, re-evaluation of patient's condition and review of old charts    MEDICATIONS ORDERED IN ED: Medications  metroNIDAZOLE (FLAGYL) IVPB 500 mg (500 mg Intravenous New Bag/Given 04/19/22 1142)  vancomycin (VANCOCIN) IVPB 1000 mg/200 mL premix (1,000 mg Intravenous New Bag/Given 04/19/22 1154)  ceFEPIme (MAXIPIME) 2 g in sodium chloride 0.9 % 100 mL IVPB (0 g Intravenous Stopped 04/19/22 1155)  acetaminophen (TYLENOL) suppository 650 mg (650 mg Rectal Given 04/19/22 1132)   lactated ringers bolus 1,000 mL (1,000 mLs Intravenous New Bag/Given 04/19/22 1155)  lactated ringers bolus 1,000 mL (1,000 mLs Intravenous New Bag/Given 04/19/22 1140)     IMPRESSION / MDM / [place] / ED COURSE  I reviewed the triage vital signs and the nursing notes.   Patient's presentation is most consistent with acute presentation with potential threat to life or bodily function.   Patient comes in with concern for sepsis.  Blood pressures are okay we will get labs to evaluate for sepsis urine evaluate for UTI.  Will get CT pan scan due to unable to get full history from patient due to altered mental status.  Report from EMS was that patient was DNR just did not have a DNR form.  Will discuss with family when they get here.  CBG 175.  COVID test negative.  Lactate normal CBC slightly elevated white count 11.80 CMP overall reassuring.  CT imaging shows possible cystitis versus atelectasis.  I suspect patient could have some aspiration pneumonia versus UTI.  Family is at bedside, 2 sons who are his POA's report that he is DNR.  We discussed intubation for airway protection given patient is significantly altered but given DNR they would not want patient to be intubated.  They would like [place] to continue fluids and antibiotics at this time.  We will discuss with the hospital team for admission  The patient is on the cardiac monitor to evaluate for evidence of arrhythmia and/or significant heart rate changes.      FINAL CLINICAL IMPRESSION(S) / ED DIAGNOSES   Final diagnoses:  Sepsis, due to unspecified organism, unspecified whether acute organ dysfunction present [facility])  Acute cystitis without hematuria     Rx / DC Orders   ED Discharge Orders     None        Note:  This document was prepared using Dragon voice recognition software and may include unintentional dictation errors.   Vanessa [place], MD 04/19/22 1331

## 2022-04-19 NOTE — Assessment & Plan Note (Addendum)
-   Patient takes carbidopa-levodopa (23.75-95 + 61.25-245) mg 4 times daily - Family is endorsing that they would like to take the special carbidopa-levodopa dosing, pharmacy has been consulted for relabeling - Gabapentin 100 mg 3 times daily - Amantadine 100 mg daily resumed - Pramipexole 0.25 mg, 3 times daily resumed

## 2022-04-19 NOTE — Sepsis Progress Note (Signed)
Elink following code sepsis °

## 2022-04-19 NOTE — Assessment & Plan Note (Signed)
-   NG tube place for medication

## 2022-04-19 NOTE — Consult Note (Signed)
PHARMACY -  BRIEF ANTIBIOTIC NOTE   Pharmacy has received consult(s) for cefepime and vancomycin from an ED provider. Patient is also ordered metronidazole. [name] profile has been reviewed for ht/wt/allergies/indication/available labs.    One time order(s) placed for  --Vancomycin 1 g IV --Cefepime 2 g IV  Further antibiotics/pharmacy consults should be ordered by admitting physician if indicated.                       Thank you, Benita Gutter 04/19/2022  11:23 AM

## 2022-04-19 NOTE — H&P (Addendum)
History and Physical   MCIHAEL Rodriguez EYC:[id number] DOB: November 04, 1940 DOA: 04/19/2022  PCP: Roger Rodriguez., MD  Outpatient Specialists: Dr. Gregg[facility] Patient coming from: Home  I have personally briefly reviewed patient's old medical records in [place].  Chief Concern: Altered mental status  HPI: Roger Rodriguez is a 81 year old male with history of Parkinson's, depression, anxiety, GERD, chronic suprapubic catheter, follows with urologist, who presents emergency department for chief concerns of altered mental status.  Initial vitals in the emergency department showed temperature of 102.7, respiration rate of 19, heart rate of 110, blood pressure 126/72, SPO2 of 92% on 16 L nasal cannula currently on 2 L nasal cannula.  Patient initially was satting at 87% on room air with no baseline O2 supplementation requirements.  Of note, patient last had his suprapubic Foley catheter change at urologist outpatient approximately 2 years weeks ago.  Serum sodium is 135, potassium 2.7, chloride 100, bicarb 22, BUN of 24, serum creatinine of 1.01, GFR greater than 60, WBC 11.8, hemoglobin 16.1, platelets of 231.  High sensitive troponin was 17.  Lactic acid 1.8.  COVID PCR was negative. UA was positive for large leukocytes.  Procalcitonin is in process.  CTA of the chest is read as no acute findings are identified in the chest, abdomen, pelvis.  Bilateral calculi nonobstructing.  Patchy dependent lung base favoring atelectasis.  Suprapubic bladder catheter in place.  Interval improvement of hepatic steatosis.  CT of the head without contrast: Was read as no acute intracranial abnormalities.  ED treatment: Acetaminophen 650 mg rectally, LR 2 L bolus, cefepime, metronidazole, vancomycin. ----------------------------------------------- At bedside patient is snoring and difficult to arouse.  He does mumble with the sound of his family's voice especially when they are  loud.  He does not wake with sternal rub.  Son reports that the last time he saw patient while and at his baseline was Friday, 04/17/2022. Son did note that his father endorse having dysuria and suprapubic tenderness along with dark-colored urine on Friday.  Son states that he was able to collect a sample and his primary/home health service was able to provide him with antibiotics p.o.  Son spoke with his father who sounded his baseline self 04/18/2022.  Per son, he had decreased appetite and p.o. intake on Saturday, 04/18/2022.   Per son, at approximately 8am, care giver had difficulty giving patient his medication.  At home, this AM, he was found to have a fever at 103.4. Left lower extremity seems weaker than the right.   No reports of vomiting or diarrhea from family.  Social history: He lives at home, with a full time care giver.  Son denies history of tobacco, EtOH, recreational drug use.  ROS: Unable to complete due to patient altered mental status.  ED Course: Discussed with emergency medicine provider, patient requiring hospitalization for chief concerns of sepsis.  Assessment/Plan  Principal Problem:   Severe sepsis with acute organ dysfunction (HCC) Active Problems:   Acute hypoxic respiratory failure (HCC)   S/P revision of total knee, right   Hyperlipidemia   Parkinson's disease   Urinary retention   Suprapubic catheter ([place])   Diabetes mellitus type 2, noninsulin dependent (HCC)   Lethargy   Assessment and Plan:  * Severe sepsis with acute organ dysfunction ([place]) - Patient met criteria for severe sepsis with fever, sinus tachycardia, leukocytosis, source of urine and new oxygen requirement - Acute hypoxic respiratory failure - Blood cultures x2 have been  collected and are in process, urine cultures in process - Procalcitonin in process - Status post cefepime, vancomycin, metronidazole and 2 L LR bolus per EDP - Additional sodium chloride 500 mL bolus to complete  sepsis bolus on admission ordered - Sodium chloride 125 mL/h maintenance fluid ordered, 1 day - Continue with broad-spectrum antibiotic, cefepime, metronidazole, vancomycin  Acute hypoxic respiratory failure (HCC) - Continue oxygen supplementation to maintain SPO2 greater than 92% - Procalcitonin is in process  Lethargy - NG tube place for medication  Diabetes mellitus type 2, noninsulin dependent ([place]) - Patient takes metformin 500 mg p.o. twice daily, this has not been resumed on admission - Insulin SSI with agents coverage ordered - Goal inpatient blood glucose levels 140-100 and  Suprapubic catheter (HCC) - Approximate change was 2 weeks ago with urology  Urinary retention - Chronic suprapubic Foley catheter requirement  Parkinson's disease - Patient takes carbidopa-levodopa (23.75-95 + 61.25-245) mg 4 times daily - Family is endorsing that they would like to take the special carbidopa-levodopa dosing, pharmacy has been consulted for relabeling - Gabapentin 100 mg 3 times daily - Amantadine 100 mg daily resumed - Pramipexole 0.25 mg, 3 times daily resumed  Aspiration, fall precaution  Chart reviewed.   DVT prophylaxis: Enoxaparin Code Status: DNR Diet: N.p.o. Family Communication: Roger Rodriguez (son) [phone] with granddaughter and grandson at bedside Disposition Plan: Pending clinical course Consults called: TOC, palliative Admission status: Inpatient, progressive cardiac  Past Medical History:  Diagnosis Date   Allergy    allergic rhinitis   Arthritis    OA   Chronic kidney disease    stones   Depression    Diabetes mellitus without complication ([place])    Family history of adverse reaction to anesthesia    Hematuria    History of kidney stones    Hyperlipidemia    Hypertension    no meds since parkinsons meds started   Incontinence of urine    Inguinal hernia    Neuromuscular disorder ([place])    parkinsons   Overactive bladder    Parkinson disease     Parkinson's disease    Prostate cancer ([place])    Rhinitis, allergic    Shortness of breath dyspnea    doe secondary to parkinsons   Past Surgical History:  Procedure Laterality Date   BACK SURGERY  2014   CYSTOSCOPY WITH STENT PLACEMENT Right 01/13/2016   Procedure: CYSTOSCOPY WITH STENT PLACEMENT;  Surgeon: Hollice Espy, MD;  Location: [facility];  Service: Urology;  Laterality: Right;   CYSTOSCOPY/URETEROSCOPY/HOLMIUM LASER/STENT PLACEMENT Left 11/20/2015   Procedure: CYSTOSCOPY/URETEROSCOPY/HOLMIUM LASER/STENT PLACEMENT;  Surgeon: Hollice Espy, MD;  Location: [facility];  Service: Urology;  Laterality: Left;   EYE SURGERY     FRACTURE SURGERY      ankle    H/O arthroscopy of right knee  2015   H/O radical protatectomy     HERNIA REPAIR     JOINT REPLACEMENT     right knee   LITHOTRIPSY  2010   Rotator Cuff tear  1993   Repaired   TOTAL KNEE REVISION Right 03/17/2018   Procedure: TOTAL KNEE REVISION;  Surgeon: Lovell Sheehan, MD;  Location: [facility];  Service: Orthopedics;  Laterality: Right;   TOTAL KNEE REVISION Right 06/08/2018   Procedure: TOTAL KNEE REVISION;  Surgeon: Lovell Sheehan, MD;  Location: [facility];  Service: Orthopedics;  Laterality: Right;   URETEROSCOPY WITH HOLMIUM LASER LITHOTRIPSY  2002?   URETEROSCOPY WITH HOLMIUM LASER LITHOTRIPSY Right 01/13/2016  Procedure: URETEROSCOPY WITH HOLMIUM LASER LITHOTRIPSY;  Surgeon: Hollice Espy, MD;  Location: [facility];  Service: Urology;  Laterality: Right;   VASECTOMY     History   Social History:  reports that he quit smoking about 53 years ago. His smoking use included cigars. He has been exposed to tobacco smoke. He quit smokeless tobacco use about 18 years ago.  His smokeless tobacco use included chew. He reports that he does not drink alcohol and does not use drugs.  Allergies  Allergen Reactions   Sulfa Antibiotics Swelling    Lip swelling   Family History  Problem Relation Age of Onset   Dementia Mother     Cerebral aneurysm Father    Bladder Cancer Paternal Uncle    Liver cancer Sister    Prostate cancer Neg Hx    Kidney cancer Neg Hx    Family history: Family history reviewed and not pertinent.  Prior to Admission medications   Medication Sig Start Date End Date Taking? Authorizing Provider  amantadine (SYMMETREL) 100 MG capsule Take 100 mg by mouth daily as needed. 04/13/21   [provider]  atropine 1 % ophthalmic solution Place 1 drop under the tongue at bedtime.    [provider]  budesonide (RHINOCORT AQUA) 32 MCG/ACT nasal spray USE 1 PUFF IN EACH NOSTRIL EVERY DAY AS NEEDED 02/07/18   Roger Rodriguez., MD  busPIRone (BUSPAR) 7.5 MG tablet Take 7.5 mg by mouth 2 (two) times daily. To 3 times daily 09/07/19   [provider]  carbidopa-levodopa (SINEMET CR) 50-200 MG tablet Take 1 tablet by mouth at bedtime. 08/23/21   [provider]  celecoxib (CELEBREX) 100 MG capsule TAKE 1 CAPSULE BY MOUTH TWICE A DAY 04/05/22   Roger Rodriguez., MD  cholecalciferol (VITAMIN D) 1000 units tablet Take 1,000 Units by mouth daily.  05/19/12   [provider]  ciprofloxacin (CIPRO) 500 MG tablet Take 1 tablet (500 mg total) by mouth 2 (two) times daily. 10/21/21   Lavonia Drafts, MD  fexofenadine (ALLEGRA) 180 MG tablet Take 180 mg by mouth daily.    [provider]  folic acid (FOLVITE) 1 MG tablet Take 1 tablet (1 mg total) by mouth daily. 08/26/21   Lorella Nimrod, MD  gabapentin (NEURONTIN) 100 MG capsule TAKE 2 CAPSULES (200 MG TOTAL) BY MOUTH 3 (THREE) TIMES DAILY FOR 90 DAYS 11/13/21   Roger Rodriguez., MD  glucose blood [facility] BLOOD GLUCOSE TEST) test strip Use as instructed 08/15/18   Roger Rodriguez., MD  hydrocortisone 2.5 % lotion Apply to the eyebrows, mid face, and beard area QHS on Tuesday, Thursday, and Saturday. 05/05/21   Ralene Bathe, MD  Hypromellose (ARTIFICIAL TEARS OP) Place 1 drop into both eyes 4 (four)  times daily as needed (for dry eyes).     [provider]  ketoconazole (NIZORAL) 2 % cream Apply 1 application topically 2 (two) times daily. 01/30/21   Debroah Loop, PA-C  lubiprostone (AMITIZA) 8 MCG capsule Take 1 capsule (8 mcg total) by mouth 2 (two) times daily with a meal. 12/22/21   Roger Rodriguez., MD  metFORMIN (GLUCOPHAGE) 500 MG tablet TAKE 1 TABLET BY MOUTH TWICE A DAY WITH MEALS 02/10/22   Roger Rodriguez., MD  mometasone (ELOCON) 0.1 % cream Apply to scaly rash area on neck once to twice daily as needed. Avoid the face, groin, and axilla. 05/05/21   Ralene Bathe,  MD  nitrofurantoin, macrocrystal-monohydrate, (MACROBID) 100 MG capsule Take 1 capsule (100 mg total) by mouth 2 (two) times daily for 7 days. 04/15/22 04/22/22  Zara Council A, PA-C  polyethylene glycol (MIRALAX / GLYCOLAX) packet Take 17 g by mouth daily as needed.     [provider]  pramipexole (MIRAPEX) 0.5 MG tablet Take 2 tablets (1 mg total) by mouth at bedtime. 10/17/17   Bettey Costa, MD  RYTARY 23.75-95 MG CPCR Take 2 capsules by mouth 2 (two) times daily. 06/08/21   [provider]  [place] 61.25-245 MG CPCR Take by mouth. 08/12/21   [provider]  vitamin B-12 (CYANOCOBALAMIN) 1000 MCG tablet Take 1,000 mcg by mouth daily.    [provider]  vitamin C (ASCORBIC ACID) 500 MG tablet Take 1,000 mg by mouth 2 (two) times daily.    [provider]  vitamin E 180 MG (400 UNITS) capsule Take 400 Units by mouth daily.     [provider]   Physical Exam: Vitals:   04/19/22 1430 04/19/22 1500 04/19/22 1530 04/19/22 1824  BP: 122/79 118/76 120/74   Pulse: (!) 101 99 97   Resp: (!) 23 (!) 22 (!) 21   Temp:    99.5 F (37.5 C)  TempSrc:    Axillary  SpO2: 96% 96% 95%   Weight:      Height:       Constitutional: appears frail, chronically ill, lethargic Eyes: PERRL, lids and conjunctivae normal ENMT: Mucous membranes are  moist. Posterior pharynx clear of any exudate or lesions. Age-appropriate dentition. Hearing appropriate Neck: normal, supple, no masses, no thyromegaly Respiratory: clear to auscultation bilaterally, no wheezing, no crackles. Normal respiratory effort. No accessory muscle use.  Cardiovascular: Regular rate and rhythm, no murmurs / rubs / gallops. No extremity edema. 2+ pedal pulses. No carotid bruits.  Abdomen: no tenderness, no masses palpated, no hepatosplenomegaly. Bowel sounds positive.  Musculoskeletal: no clubbing / cyanosis. No joint deformity upper and lower extremities.  Unable to assess range of motion. no contractures, no atrophy. Normal muscle tone.  Skin: no rashes, lesions, ulcers. No induration.  Ecchymosis in the left dorsal surface of his hand, approximately 6 x 8 cm, dark purple/blue Neurologic: Unable to assess intact.  Unable to assess strength fully in all extremities Psychiatric: Unable to assess judgment and insight.  Not awake alert and oriented.  Unable to assess mood.   EKG: independently reviewed, showing tachycardia with regular rhythm and with rate of 113, QTc 447, LVH  Chest x-ray on Admission: I personally reviewed and I agree with radiologist reading as below.  MR BRAIN WO CONTRAST  Result Date: 04/19/2022 CLINICAL DATA:  Mental status change, unknown cause. EXAM: MRI HEAD WITHOUT CONTRAST TECHNIQUE: Multiplanar, multiecho pulse sequences of the brain and surrounding structures were obtained without intravenous contrast. COMPARISON:  CT head without contrast 04/19/2022. MR head without contrast 10/13/2017 FINDINGS: Brain: No acute infarct, hemorrhage, or mass lesion is present. Mild atrophy and white matter changes are within normal limits for age. Cavum septum pellucidum again noted. The cranial clear are within normal limits. Ventricles are of normal size. No significant extraaxial fluid collection is present. The internal auditory canals are within normal limits.  One additional remote lacunar infarct is present in each cerebellum since the prior exam. No acute infarcts are present. Vascular: Flow is present in the major intracranial arteries. Skull and upper cervical spine: The craniocervical junction is normal. Upper cervical spine is within normal limits. Marrow signal  is unremarkable. Sinuses/Orbits: A polyp or mucous retention cyst is present the right maxillary sinus. The paranasal sinuses and mastoid air cells are otherwise clear. Bilateral lens replacements are noted. Globes and orbits are otherwise unremarkable. IMPRESSION: 1. No acute intracranial abnormality or significant interval change. 2. 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 Electronically Signed   By: San Morelle M.D.   On: 04/19/2022 16:44   CT CHEST ABDOMEN PELVIS WO CONTRAST  Result Date: 04/19/2022 CLINICAL DATA:  Possible sepsis EXAM: CT CHEST, ABDOMEN AND PELVIS WITHOUT CONTRAST TECHNIQUE: Multidetector CT imaging of the chest, abdomen and pelvis was performed following the standard protocol without IV contrast. RADIATION DOSE REDUCTION: This exam was performed according to the departmental dose-optimization program which includes automated exposure control, adjustment of the mA and/or kV according to patient size and/or use of iterative reconstruction technique. COMPARISON:  Abdominopelvic CT 10/30/2015. FINDINGS: CT CHEST FINDINGS Cardiovascular: Atherosclerosis of the aorta, great vessels and coronary arteries. No acute vascular findings on noncontrast imaging. There are calcifications of the aortic valve and mitral annulus. The heart size is normal. There is no pericardial effusion. Mediastinum/Nodes: There are no enlarged mediastinal, hilar or axillary lymph nodes. The thyroid gland, trachea and esophagus demonstrate no significant findings. Lungs/Pleura: No pleural effusion or pneumothorax. Streaky opacities at both lung bases, most  consistent with atelectasis. There is mild central airway thickening. No confluent airspace opacity or suspicious pulmonary nodularity. Musculoskeletal/Chest wall: No chest wall mass or suspicious osseous findings. Postsurgical changes at the right shoulder. Mild multilevel thoracic spondylosis. CT ABDOMEN AND PELVIS FINDINGS Hepatobiliary: Compared with the prior CT, appendix steatosis has improved. No focal abnormality identified on noncontrast imaging. No evidence of gallstones, gallbladder wall thickening or biliary dilatation. Pancreas: Atrophied with fatty replacement. No focal abnormality or surrounding inflammation. Spleen: Normal in size without focal abnormality. Adrenals/Urinary Tract: Both adrenal glands appear normal. There are small nonobstructing bilateral renal calculi. Bilateral renal cysts are noted which are similar to the previous study and do not require any specific imaging follow-up. There is mild chronic dilatation of the left renal pelvis and left ureter, but no recurrent ureteral calculi are identified. There is no perinephric soft tissue stranding. A suprapubic bladder catheter is in place. The bladder is decompressed with possible wall thickening. No evidence of bladder calculus. Stomach/Bowel: No enteric contrast administered. The stomach appears unremarkable for its degree of distension. No evidence of bowel wall thickening, distention or surrounding inflammatory change. Diverticulum of the 3rd portion of the duodenum. Moderate stool throughout the colon. Vascular/Lymphatic: There are no enlarged abdominal or pelvic lymph nodes. Aortic and branch vessel atherosclerosis without evidence of significant aneurysm. Reproductive: Previous prostatectomy without evidence of recurrent mass. Other: No evidence of abdominal wall mass or hernia. No ascites. Musculoskeletal: No acute or significant osseous findings. Multilevel lumbar spondylosis with multilevel postsurgical changes inferiorly.  Patient has developed generalized muscular atrophy. IMPRESSION: 1. No acute findings are identified in the chest, abdomen or pelvis. 2. Patchy dependent opacities at the lung bases, favored to reflect atelectasis. 3. Nonobstructing bilateral renal calculi. Mild chronic dilatation of the left renal pelvis and left ureter without evidence of recurrent ureteral calculus or perinephric soft tissue stranding. 4. Suprapubic bladder catheter in place. The bladder is decompressed with possible wall thickening, nonspecific. Correlate with urine analysis. 5. Interval improvement in hepatic steatosis. 6.  Aortic Atherosclerosis (ICD10-I70.0). Electronically Signed   By: Richardean Sale M.D.   On: 04/19/2022 13:01  CT HEAD WO CONTRAST (5MM)  Result Date: 04/19/2022 CLINICAL DATA:  Mental status change of unknown cause. EXAM: CT HEAD WITHOUT CONTRAST TECHNIQUE: Contiguous axial images were obtained from the base of the skull through the vertex without intravenous contrast. RADIATION DOSE REDUCTION: This exam was performed according to the departmental dose-optimization program which includes automated exposure control, adjustment of the mA and/or kV according to patient size and/or use of iterative reconstruction technique. COMPARISON:  10/12/2017. FINDINGS: Brain: No evidence of acute infarction, hemorrhage, hydrocephalus, extra-axial collection or mass lesion/mass effect. Vascular: No hyperdense vessel or unexpected calcification. Skull: Normal. Negative for fracture or focal lesion. Sinuses/Orbits: Globes and orbits are unremarkable. Mucous retention cysts in the floor the right maxillary sinus. Minor ethmoid sinus mucosal thickening. Other: None. IMPRESSION: 1. No acute intracranial abnormalities. Stable appearance from the prior head CT. Electronically Signed   By: Lajean Manes M.D.   On: 04/19/2022 12:57   DG Chest Port 1 View  Result Date: 04/19/2022 CLINICAL DATA:  Possible sepsis EXAM: PORTABLE CHEST 1 VIEW  COMPARISON:  Previous studies including the examination of 08/22/2021 FINDINGS: Cardiac size is within normal limits. There are no signs of pulmonary edema or new focal infiltrates. There is no pleural effusion or pneumothorax. Degenerative changes are noted in both shoulders. IMPRESSION: There are no signs of pulmonary edema or new focal infiltrates. Electronically Signed   By: Elmer Picker M.D.   On: 04/19/2022 12:03    Labs on Admission: I have personally reviewed following labs  CBC: Recent Labs  Lab 04/19/22 1127  WBC 11.8*  NEUTROABS 9.7*  HGB 16.1  HCT 48.9  MCV 93.9  PLT 174   Basic Metabolic Panel: Recent Labs  Lab 04/19/22 1127  NA 135  K 3.7  CL 100  CO2 22  GLUCOSE 171*  BUN 24*  CREATININE 1.01  CALCIUM 8.3*   GFR: Estimated Creatinine Clearance: 61.9 mL/min (by C-G formula based on SCr of 1.01 mg/dL).  Liver Function Tests: Recent Labs  Lab 04/19/22 1127  AST 11*  ALT <5  ALKPHOS 56  BILITOT 1.5*  PROT 7.9  ALBUMIN 3.8   Coagulation Profile: Recent Labs  Lab 04/19/22 1127  INR 1.2   CBG: Recent Labs  Lab 04/19/22 1324 04/19/22 1657  GLUCAP 175* 141*   Urine analysis:    Component Value Date/Time   COLORURINE YELLOW (A) 04/19/2022 1127   APPEARANCEUR CLOUDY (A) 04/19/2022 1127   APPEARANCEUR Cloudy (A) 04/15/2022 1010   LABSPEC 1.017 04/19/2022 1127   LABSPEC 1.013 03/21/2014 1433   PHURINE 5.0 04/19/2022 1127   GLUCOSEU NEGATIVE 04/19/2022 1127   GLUCOSEU Negative 03/21/2014 1433   HGBUR MODERATE (A) 04/19/2022 1127   BILIRUBINUR NEGATIVE 04/19/2022 1127   BILIRUBINUR Negative 02/04/2022 1353   BILIRUBINUR Negative 03/21/2014 1433   KETONESUR 20 (A) 04/19/2022 1127   PROTEINUR NEGATIVE 04/19/2022 1127   UROBILINOGEN 0.2 06/02/2018 1601   NITRITE NEGATIVE 04/19/2022 1127   LEUKOCYTESUR LARGE (A) 04/19/2022 1127   LEUKOCYTESUR Trace 03/21/2014 1433   CRITICAL CARE Performed by: Dr. Tobie Poet  Total critical care time: 35  minutes  Critical care time was exclusive of separately billable procedures and treating other patients.  Critical care was necessary to treat or prevent imminent or life-threatening deterioration.  Severe sepsis, acute hypoxic respiratory failure  Critical care was time spent personally by me on the following activities: development of treatment plan with patient and/or surrogate as well as nursing, discussions with consultants, evaluation of patient's response to  treatment, examination of patient, obtaining history from patient or surrogate, ordering and performing treatments and interventions, ordering and review of laboratory studies, ordering and review of radiographic studies, pulse oximetry and re-evaluation of patient's condition.  Dr. Tobie Poet [facility]  If 7PM-7AM, please contact overnight-coverage provider If 7AM-7PM, please contact day coverage provider [email or web address]  04/19/2022, 7:29 PM

## 2022-04-19 NOTE — Assessment & Plan Note (Signed)
-   Approximate change was 2 weeks ago with urology

## 2022-04-19 NOTE — ED Notes (Signed)
Son and grandson at bedside at this time. Updated family on plan of care at this time. No needs expressed.   Switched catheter from leg bag to standard drainage bag.

## 2022-04-19 NOTE — Assessment & Plan Note (Signed)
-   Continue oxygen supplementation to maintain SPO2 greater than 92% - Procalcitonin is in process

## 2022-04-19 NOTE — ED Notes (Signed)
Pt is lethargic and still altered at this time. Messaged Dr. Tobie Poet at this time to hold patient.

## 2022-04-19 NOTE — Assessment & Plan Note (Signed)
-   Chronic suprapubic Foley catheter requirement

## 2022-04-19 NOTE — Hospital Course (Addendum)
[name] is a 81 year old male with history of Parkinson's, depression, anxiety, GERD, chronic suprapubic catheter, follows with urologist, who presents emergency department for chief concerns of altered mental status.  Initial vitals in the emergency department showed temperature of 102.7, respiration rate of 19, heart rate of 110, blood pressure 126/72, SPO2 of 92% on 16 L nasal cannula currently on 2 L nasal cannula.  Patient initially was satting at 87% on room air with no baseline O2 supplementation requirements.  Of note, patient last had his suprapubic Foley catheter change at urologist outpatient approximately 2 years weeks ago.  Serum sodium is 135, potassium 2.7, chloride 100, bicarb 22, BUN of 24, serum creatinine of 1.01, GFR greater than 60, WBC 11.8, hemoglobin 16.1, platelets of 231.  High sensitive troponin was 17.  Lactic acid 1.8.  COVID PCR was negative. UA was positive for large leukocytes.  Procalcitonin is in process.  CTA of the chest is read as no acute findings are identified in the chest, abdomen, pelvis.  Bilateral calculi nonobstructing.  Patchy dependent lung base favoring atelectasis.  Suprapubic bladder catheter in place.  Interval improvement of hepatic steatosis.  CT of the head without contrast: Was read as no acute intracranial abnormalities.  ED treatment: Acetaminophen 650 mg rectally, LR 2 L bolus, cefepime, metronidazole, vancomycin.

## 2022-04-20 ENCOUNTER — Other Ambulatory Visit: Payer: Self-pay

## 2022-04-20 DIAGNOSIS — Z7189 Other specified counseling: Secondary | ICD-10-CM

## 2022-04-20 DIAGNOSIS — R6520 Severe sepsis without septic shock: Secondary | ICD-10-CM | POA: Diagnosis not present

## 2022-04-20 DIAGNOSIS — A419 Sepsis, unspecified organism: Secondary | ICD-10-CM | POA: Diagnosis not present

## 2022-04-20 LAB — BASIC METABOLIC PANEL
Anion gap: 5 (ref 5–15)
BUN: 18 mg/dL (ref 8–23)
CO2: 26 mmol/L (ref 22–32)
Calcium: 8 mg/dL — ABNORMAL LOW (ref 8.9–10.3)
Chloride: 105 mmol/L (ref 98–111)
Creatinine, Ser: 0.73 mg/dL (ref 0.61–1.24)
GFR, Estimated: >60 mL/min (ref >60)
Glucose, Bld: 136 mg/dL — ABNORMAL HIGH (ref 70–99)
Potassium: 3.7 mmol/L (ref 3.5–5.1)
Sodium: 136 mmol/L (ref 135–145)

## 2022-04-20 LAB — HEMOGLOBIN A1C
Hgb A1c MFr Bld: 6.2 % — ABNORMAL HIGH (ref 4.8–5.6)
Mean Plasma Glucose: 131.24 mg/dL

## 2022-04-20 LAB — CULTURE, URINE COMPREHENSIVE

## 2022-04-20 LAB — CBC
HCT: 42.7 % (ref 39.0–52.0)
Hemoglobin: 13.7 g/dL (ref 13.0–17.0)
MCH: 30 pg (ref 26.0–34.0)
MCHC: 32.1 g/dL (ref 30.0–36.0)
MCV: 93.4 fL (ref 80.0–100.0)
Platelets: 175 10*3/uL (ref 150–400)
RBC: 4.57 MIL/uL (ref 4.22–5.81)
RDW: 14 % (ref 11.5–15.5)
WBC: 9.4 10*3/uL (ref 4.0–10.5)
nRBC: 0 % (ref 0.0–0.2)

## 2022-04-20 LAB — GLUCOSE, CAPILLARY
Glucose-Capillary: 113 mg/dL — ABNORMAL HIGH (ref 70–99)
Glucose-Capillary: 151 mg/dL — ABNORMAL HIGH (ref 70–99)
Glucose-Capillary: 180 mg/dL — ABNORMAL HIGH (ref 70–99)

## 2022-04-20 LAB — PROCALCITONIN: Procalcitonin: <0.1 ng/mL

## 2022-04-20 LAB — CBG MONITORING, ED
Glucose-Capillary: 114 mg/dL — ABNORMAL HIGH (ref 70–99)
Glucose-Capillary: 120 mg/dL — ABNORMAL HIGH (ref 70–99)

## 2022-04-20 MED ORDER — ORAL CARE MOUTH RINSE: 15.0000 mL | OROMUCOSAL | Status: DC | PRN

## 2022-04-20 MED ORDER — IBUPROFEN 400 MG PO TABS
400.0000 mg | ORAL_TABLET | Freq: Once | ORAL | Status: AC
  Administered 2022-04-20: 400 mg via ORAL
  Filled 2022-04-20: qty 1

## 2022-04-20 MED ORDER — INFLUENZA VAC A&B SA ADJ QUAD 0.5 ML IM PRSY
0.5000 mL | PREFILLED_SYRINGE | INTRAMUSCULAR | Status: AC
  Administered 2022-04-22: 0.5 mL via INTRAMUSCULAR
  Filled 2022-04-20: qty 0.5

## 2022-04-20 MED ORDER — VANCOMYCIN HCL IN DEXTROSE 1-5 GM/200ML-% IV SOLN
1000.0000 mg | Freq: Two times a day (BID) | INTRAVENOUS | Status: DC
  Filled 2022-04-20: qty 200

## 2022-04-20 MED ORDER — ORAL CARE MOUTH RINSE
15.0000 mL | OROMUCOSAL | Status: DC
  Administered 2022-04-20 – 2022-04-22 (×6): 15 mL via OROMUCOSAL

## 2022-04-20 MED ORDER — SODIUM CHLORIDE 0.9 % IV SOLN
2.0000 g | INTRAVENOUS | Status: AC
  Administered 2022-04-20 – 2022-04-21 (×2): 2 g via INTRAVENOUS
  Filled 2022-04-20 (×2): qty 20

## 2022-04-20 NOTE — Progress Notes (Signed)
Chart reviewed, Pt currently off the floor. Will reattempt later.

## 2022-04-20 NOTE — Evaluation (Signed)
Clinical/Bedside Swallow Evaluation Patient Details  Name: JAMAS JAQUAY MRN: [id number] Date of Birth: Jan 15, 1941  Today's Date: 04/20/2022 Time: SLP Start Time (ACUTE ONLY): 22 SLP Stop Time (ACUTE ONLY): 1320 SLP Time Calculation (min) (ACUTE ONLY): 60 min  Past Medical History:  Past Medical History:  Diagnosis Date   Allergy    allergic rhinitis   Arthritis    OA   Chronic kidney disease    stones   Depression    Diabetes mellitus without complication ([place])    Family history of adverse reaction to anesthesia    Hematuria    History of kidney stones    Hyperlipidemia    Hypertension    no meds since parkinsons meds started   Incontinence of urine    Inguinal hernia    Neuromuscular disorder ([place])    parkinsons   Overactive bladder    Parkinson disease    Parkinson's disease    Prostate cancer ([place])    Rhinitis, allergic    Shortness of breath dyspnea    doe secondary to parkinsons   Past Surgical History:  Past Surgical History:  Procedure Laterality Date   BACK SURGERY  2014   CYSTOSCOPY WITH STENT PLACEMENT Right 01/13/2016   Procedure: CYSTOSCOPY WITH STENT PLACEMENT;  Surgeon: Hollice Espy, MD;  Location: [facility] ORS;  Service: Urology;  Laterality: Right;   CYSTOSCOPY/URETEROSCOPY/HOLMIUM LASER/STENT PLACEMENT Left 11/20/2015   Procedure: CYSTOSCOPY/URETEROSCOPY/HOLMIUM LASER/STENT PLACEMENT;  Surgeon: Hollice Espy, MD;  Location: [facility] OR[facility];  Service: Urology;  Laterality: Left;   EYE SURGERY     FRACTURE SURGERY      ankle    H/O arthroscopy of right knee  2015   H/O radical protatectomy     HERNIA REPAIR     JOINT REPLACEMENT     right knee   LITHOTRIPSY  2010   Rotator Cuff tear  1993   Repaired   TOTAL KNEE REVISION Right 03/17/2018   Procedure: TOTAL KNEE REVISION;  Surgeon: Lovell Sheehan, MD;  Location: [facility];  Service: Orthopedics;  Laterality: Right;   TOTAL KNEE REVISION Right 06/08/2018   Procedure: TOTAL KNEE REVISION;  Surgeon: Lovell Sheehan, MD;  Location: [facility];  Service: Orthopedics;  Laterality: Right;   URETEROSCOPY WITH HOLMIUM LASER LITHOTRIPSY  2002?   URETEROSCOPY WITH HOLMIUM LASER LITHOTRIPSY Right 01/13/2016   Procedure: URETEROSCOPY WITH HOLMIUM LASER LITHOTRIPSY;  Surgeon: Hollice Espy, MD;  Location: [facility] OR[facility];  Service: Urology;  Laterality: Right;   VASECTOMY     History   HPI:  Per admitting H&P "Mr. Elya Diloreto is a 81 year old male with history of Parkinson's, depression, anxiety, GERD, chronic suprapubic catheter, follows with urologist, who presents emergency department for chief concerns of altered mental status."    Assessment / Plan / Recommendation  Clinical Impression  This very pleasant 81 YO male presents with moderate dysphagia. Pt was somewhat sleepy during assssment likely affecting the assessment. Pt has dx of Parkinsons but is able to eat and drink regular foods at home per Pt and confirmed by grandson. Pt has a 24 hr caregiver that prepares foods and assists with meals. Today, Pt presented with inconsistent s/s of aspiration with thin liquids (once by Tsp and once by straw). He tolerated nectar thick liquids and applesauce boluses well. Given a graham cracker, Pt. needed extended time to chew and swallow. Moderate oral residue after the swallow. Rec Dys 1 diet with nectar thick liquids with strict aspiration precautions. Only eat when fully alert.  Crushable meds to be given in applesauce or pudding. ST to follow up with toleration of diet and hopefully upgrade as Pt shows overall improvement. SLP Visit Diagnosis: Dysphagia, oropharyngeal phase (R13.12)    Aspiration Risk  Moderate aspiration risk    Diet Recommendation Dysphagia 1 (Puree);Nectar-thick liquid   Medication Administration: Crushed with puree Supervision: Full supervision/cueing for compensatory strategies Compensations: Minimize environmental distractions;Slow rate;Small sips/bites Postural Changes: Seated upright at 90  degrees;Remain upright for at least 30 minutes after po intake    Other  Recommendations Oral Care Recommendations: Oral care BID;Oral care prior to ice chip/H20    Recommendations for follow up therapy are one component of a multi-disciplinary discharge planning process, led by the attending physician.  Recommendations may be updated based on patient status, additional functional criteria and insurance authorization.  Follow up Recommendations Home health SLP      Assistance Recommended at Discharge Frequent or constant Supervision/Assistance  Functional Status Assessment Patient has had a recent decline in their functional status and demonstrates the ability to make significant improvements in function in a reasonable and predictable amount of time.  Frequency and Duration min 3x week  1 week       Prognosis Prognosis for Safe Diet Advancement: Guarded Barriers to Reach Goals: Severity of deficits      Swallow Study   General Date of Onset: 04/19/22 HPI: Per admitting H&P "Mr. Maui Britten is a 81 year old male with history of Parkinson's, depression, anxiety, GERD, chronic suprapubic catheter, follows with urologist, who presents emergency department for chief concerns of altered mental status." Type of Study: Bedside Swallow Evaluation Diet Prior to this Study: Regular Temperature Spikes Noted: Yes Respiratory Status: Room air History of Recent Intubation: No Behavior/Cognition: Cooperative;Pleasant mood Oral Cavity - Dentition: Adequate natural dentition;Missing dentition Vision: Impaired for self-feeding Self-Feeding Abilities: Total assist Patient Positioning: Upright in bed Baseline Vocal Quality: Low vocal intensity Volitional Cough: Weak    Oral/Motor/Sensory Function Overall Oral Motor/Sensory Function: Moderate impairment Facial ROM: Other (Comment) (overall weakness/ Parkinsons) Facial Symmetry: Within Functional Limits Facial Strength: Reduced right;Reduced  left Lingual ROM: Reduced right;Reduced left Lingual Symmetry: Within Functional Limits Lingual Strength: Reduced Mandible: Impaired   Ice Chips Ice chips: Within functional limits Presentation: Spoon   Thin Liquid Thin Liquid: Impaired Presentation: Cup;Straw;Spoon Oral Phase Functional Implications: Oral holding Pharyngeal  Phase Impairments: Cough - Immediate    Nectar Thick Nectar Thick Liquid: Within functional limits Presentation: Cup   Honey Thick Honey Thick Liquid: Not tested   Puree Puree: Within functional limits Presentation: Spoon   Solid     Solid: Impaired Oral Phase Impairments: Reduced lingual movement/coordination;Impaired mastication Oral Phase Functional Implications: Prolonged oral transit;Impaired mastication;Oral residue      Lucila Maine 04/20/2022,2:02 PM

## 2022-04-20 NOTE — Consult Note (Signed)
Pharmacy Antibiotic Note  Roger Rodriguez is a 81 y.o. male admitted on 04/19/2022 with pneumonia, sepsis, and UTI.  Pharmacy has been consulted for vancomycin and cefepime dosing.  Assessment: 81 yo M with PMH Parkinson's and suprapubic catheter who comes in with concerns for sepsis, thought to be potentially secondary to UTI with aspiration pneumonia given patchy opacities on CXR, favored to reflect atelectasis. Older Ucx from 10/4 growing >100,000 CFU/mL GNRs and was prescribed nitrofurantoin. Pt was febrile with EMS and hypoxic, placed on 2 L with EMS. Pt currently febrile (Tmax 102.7) with tachycardia and tachypnea. Lactic acid 1.6. Pt currently satting 94% on RA. Pt received vancomycin 1 g x 1 and cefepime 2 g x 1 in the ED.  Vancomycin 1000 mg IV q12H: Goal AUC 400-550  Est AUC: 465; Cmax: 28.5; Cmin: 13.3 SCr 0.8 (actual 0.73) IBW; Vd 0.72  F/u MRSA PCR ordered & cultures.   Plan: Complete vancomycin load with additional vancomycin1 g x 1 (total loading dose = 2 g) Adjusting to vancomycin 1000 mg IV q12H Continue cefepime 2 g IV q8H Monitor renal function to assess for any necessary changes in antibiotic regimen Monitor culture results for potential antimicrobial de-escalation  Height: 6' (182.9 cm) Weight: 86.2 kg (190 lb) IBW/kg (Calculated) : 77.6  Temp (24hrs), Avg:99.4 F (37.4 C), Min:98.5 F (36.9 C), Max:100.1 F (37.8 C)  Recent Labs  Lab 04/19/22 1127 04/19/22 1326 04/20/22 0508  WBC 11.8*  --  9.4  CREATININE 1.01  --  0.73  LATICACIDVEN 1.8 1.6  --      Estimated Creatinine Clearance: 78.1 mL/min (by C-G formula based on SCr of 0.73 mg/dL).    Allergies  Allergen Reactions   Sulfa Antibiotics Swelling    Lip swelling    Antimicrobials this admission: vancomycin 10/8 >>  cefepime 10/8 >>  metronidazole 10/8 >>  Dose adjustments this admission: Adjusting from vanc '1750mg'$  q24h to '1000mg'$  q12h  Microbiology results: 10/8 BCx: NG at 24 hr 10/8  UCx: collected 10/8 MRSA PCR: negative  Thank you for allowing pharmacy to be a part of this patient's care.  Aubery Lapping, PharmD 04/20/2022 12:23 PM

## 2022-04-20 NOTE — Progress Notes (Signed)
[facility] [place] NAME: Roger Rodriguez    MR#:  [id number]  DATE OF BIRTH:  23-Oct-1940  SUBJECTIVE:  grandson at bedside. Patient worked with Astronomer. Benard Rink recommendations noted. No fever today.    VITALS:  Blood pressure (!) 142/84, pulse 79, temperature 98.5 F (36.9 C), temperature source Oral, resp. rate (!) 24, height 6' (1.829 m), weight 86.2 kg, SpO2 97 %.  PHYSICAL EXAMINATION:   GENERAL:  81 y.o.-year-old patient lying in the bed with no acute distress.  LUNGS: Normal breath sounds bilaterally, no wheezing CARDIOVASCULAR: S1, S2 normal. No murmurs,   ABDOMEN: Soft, nontender, nondistended. Bowel sounds present. Suprapubic catheter site looks okay EXTREMITIES: No  edema b/l.    NEUROLOGIC: nonfocal  patient is alert and awake SKIN: per RN  LABORATORY PANEL:  CBC Recent Labs  Lab 04/20/22 0508  WBC 9.4  HGB 13.7  HCT 42.7  PLT 175    Chemistries  Recent Labs  Lab 04/19/22 1127 04/20/22 0508  NA 135 136  K 3.7 3.7  CL 100 105  CO2 22 26  GLUCOSE 171* 136*  BUN 24* 18  CREATININE 1.01 0.73  CALCIUM 8.3* 8.0*  AST 11*  --   ALT <5  --   ALKPHOS 56  --   BILITOT 1.5*  --    Cardiac Enzymes No results for input(s): "TROPONINI" in the last 168 hours. RADIOLOGY:  MR BRAIN WO CONTRAST  Result Date: 04/19/2022 CLINICAL DATA:  Mental status change, unknown cause. EXAM: MRI HEAD WITHOUT CONTRAST TECHNIQUE: Multiplanar, multiecho pulse sequences of the brain and surrounding structures were obtained without intravenous contrast. COMPARISON:  CT head without contrast 04/19/2022. MR head without contrast 10/13/2017 FINDINGS: Brain: No acute infarct, hemorrhage, or mass lesion is present. Mild atrophy and white matter changes are within normal limits for age. Cavum septum pellucidum again noted. The cranial clear are within normal limits. Ventricles are of normal size. No significant extraaxial fluid collection  is present. The internal auditory canals are within normal limits. One additional remote lacunar infarct is present in each cerebellum since the prior exam. No acute infarcts are present. Vascular: Flow is present in the major intracranial arteries. Skull and upper cervical spine: The craniocervical junction is normal. Upper cervical spine is within normal limits. Marrow signal is unremarkable. Sinuses/Orbits: A polyp or mucous retention cyst is present the right maxillary sinus. The paranasal sinuses and mastoid air cells are otherwise clear. Bilateral lens replacements are noted. Globes and orbits are otherwise unremarkable. IMPRESSION: 1. No acute intracranial abnormality or significant interval change. 2. 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 Electronically Signed   By: San Morelle M.D.   On: 04/19/2022 16:44   CT CHEST ABDOMEN PELVIS WO CONTRAST  Result Date: 04/19/2022 CLINICAL DATA:  Possible sepsis EXAM: CT CHEST, ABDOMEN AND PELVIS WITHOUT CONTRAST TECHNIQUE: Multidetector CT imaging of the chest, abdomen and pelvis was performed following the standard protocol without IV contrast. RADIATION DOSE REDUCTION: This exam was performed according to the departmental dose-optimization program which includes automated exposure control, adjustment of the mA and/or kV according to patient size and/or use of iterative reconstruction technique. COMPARISON:  Abdominopelvic CT 10/30/2015. FINDINGS: CT CHEST FINDINGS Cardiovascular: Atherosclerosis of the aorta, great vessels and coronary arteries. No acute vascular findings on noncontrast imaging. There are calcifications of the aortic valve and mitral annulus. The heart size is normal. There is  no pericardial effusion. Mediastinum/Nodes: There are no enlarged mediastinal, hilar or axillary lymph nodes. The thyroid gland, trachea and esophagus demonstrate no significant findings. Lungs/Pleura: No pleural  effusion or pneumothorax. Streaky opacities at both lung bases, most consistent with atelectasis. There is mild central airway thickening. No confluent airspace opacity or suspicious pulmonary nodularity. Musculoskeletal/Chest wall: No chest wall mass or suspicious osseous findings. Postsurgical changes at the right shoulder. Mild multilevel thoracic spondylosis. CT ABDOMEN AND PELVIS FINDINGS Hepatobiliary: Compared with the prior CT, appendix steatosis has improved. No focal abnormality identified on noncontrast imaging. No evidence of gallstones, gallbladder wall thickening or biliary dilatation. Pancreas: Atrophied with fatty replacement. No focal abnormality or surrounding inflammation. Spleen: Normal in size without focal abnormality. Adrenals/Urinary Tract: Both adrenal glands appear normal. There are small nonobstructing bilateral renal calculi. Bilateral renal cysts are noted which are similar to the previous study and do not require any specific imaging follow-up. There is mild chronic dilatation of the left renal pelvis and left ureter, but no recurrent ureteral calculi are identified. There is no perinephric soft tissue stranding. A suprapubic bladder catheter is in place. The bladder is decompressed with possible wall thickening. No evidence of bladder calculus. Stomach/Bowel: No enteric contrast administered. The stomach appears unremarkable for its degree of distension. No evidence of bowel wall thickening, distention or surrounding inflammatory change. Diverticulum of the 3rd portion of the duodenum. Moderate stool throughout the colon. Vascular/Lymphatic: There are no enlarged abdominal or pelvic lymph nodes. Aortic and branch vessel atherosclerosis without evidence of significant aneurysm. Reproductive: Previous prostatectomy without evidence of recurrent mass. Other: No evidence of abdominal wall mass or hernia. No ascites. Musculoskeletal: No acute or significant osseous findings. Multilevel  lumbar spondylosis with multilevel postsurgical changes inferiorly. Patient has developed generalized muscular atrophy. IMPRESSION: 1. No acute findings are identified in the chest, abdomen or pelvis. 2. Patchy dependent opacities at the lung bases, favored to reflect atelectasis. 3. Nonobstructing bilateral renal calculi. Mild chronic dilatation of the left renal pelvis and left ureter without evidence of recurrent ureteral calculus or perinephric soft tissue stranding. 4. Suprapubic bladder catheter in place. The bladder is decompressed with possible wall thickening, nonspecific. Correlate with urine analysis. 5. Interval improvement in hepatic steatosis. 6.  Aortic Atherosclerosis (ICD10-I70.0). Electronically Signed   By: Richardean Sale M.D.   On: 04/19/2022 13:01   CT HEAD WO CONTRAST (5MM)  Result Date: 04/19/2022 CLINICAL DATA:  Mental status change of unknown cause. EXAM: CT HEAD WITHOUT CONTRAST TECHNIQUE: Contiguous axial images were obtained from the base of the skull through the vertex without intravenous contrast. RADIATION DOSE REDUCTION: This exam was performed according to the departmental dose-optimization program which includes automated exposure control, adjustment of the mA and/or kV according to patient size and/or use of iterative reconstruction technique. COMPARISON:  10/12/2017. FINDINGS: Brain: No evidence of acute infarction, hemorrhage, hydrocephalus, extra-axial collection or mass lesion/mass effect. Vascular: No hyperdense vessel or unexpected calcification. Skull: Normal. Negative for fracture or focal lesion. Sinuses/Orbits: Globes and orbits are unremarkable. Mucous retention cysts in the floor the right maxillary sinus. Minor ethmoid sinus mucosal thickening. Other: None. IMPRESSION: 1. No acute intracranial abnormalities. Stable appearance from the prior head CT. Electronically Signed   By: Lajean Manes M.D.   On: 04/19/2022 12:57   DG Chest Port 1 View  Result Date:  04/19/2022 CLINICAL DATA:  Possible sepsis EXAM: PORTABLE CHEST 1 VIEW COMPARISON:  Previous studies including the examination of 08/22/2021 FINDINGS: Cardiac size is within normal limits. There  are no signs of pulmonary edema or new focal infiltrates. There is no pleural effusion or pneumothorax. Degenerative changes are noted in both shoulders. IMPRESSION: There are no signs of pulmonary edema or new focal infiltrates. Electronically Signed   By: Elmer Picker M.D.   On: 04/19/2022 12:03    Assessment and Plan  Roger Rodriguez is a 81 year old male with history of Parkinson's, depression, anxiety, GERD, chronic suprapubic catheter, follows with urologist, who presents emergency department for chief concerns of altered mental status.   Initial vitals in the emergency department showed temperature of 102.7, tachycardia  patient last had his suprapubic Foley catheter change at urologist outpatient approximately 2 years weeks ago.  CTA of the chest is read as no acute findings are identified in the chest, abdomen, pelvis.  Bilateral calculi nonobstructing.  Patchy dependent lung base favoring atelectasis.  Suprapubic bladder catheter in place.  Interval improvement of hepatic steatosis.   CT of the head without contrast: Was read as no acute intracranial abnormalities.  Severe sepsis due to UTI in the setting of chronic suprapubic catheter with acute organ dysfunction [facility]) - Patient met criteria for severe sepsis with fever, sinus tachycardia, leukocytosis, source of urine and new oxygen requirement - Blood cultures x2  negative --UC multiple species -- urine culture from October 4 (urology) shows Klebsiella species pain sensitive - Procalcitonin neg - IV Rocephin -- patient is awake alert or family at baseline. -- No fever white count normal  Acute hypoxic respiratory failure (HCC) - Continue oxygen supplementation to maintain SPO2 greater than 92% - likely the setting of above. Will  reenter room air   Diabetes mellitus type 2, noninsulin dependent ([address]) - Patient takes metformin  - Insulin SSI with agents coverage ordered - A1c 6.2%   Urinary retention - Chronic suprapubic Foley catheter requirement - Approximate change was 2 weeks ago with urology   Parkinson's disease - Patient takes carbidopa-levodopa - Gabapentin,Amantadine, Pramipexole  -- seen by speech therapist. Recommendations noted.  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 -Patient does have caregiver at home  Aspiration, fall precaution    DVT prophylaxis: Enoxaparin Code Status: DNR   pt is followed by palliative care as outpatient.  Procedures: Family communication : grandson at bedside. Left message for Reather Littler  Level of care: Progressive Status is: Inpatient Remains inpatient appropriate because: Sepsis    TOTAL TIME TAKING CARE OF THIS PATIENT: 35 minutes.  >50% time spent on counselling and coordination of care  Note: This dictation was prepared with Dragon dictation along with smaller phrase technology. Any transcriptional errors that result from this process are unintentional.  Fritzi Mandes M.D    [facility]   CC: Primary care physician; [name]., MD

## 2022-04-20 NOTE — Consult Note (Signed)
Consultation Note Date: 04/21/2022   Patient Name: Roger Rodriguez  DOB: Jan 26, 1941  MRN: [id number]  Age / Sex: 81 y.o., male  PCP: [name]., MD Referring Physician: Fritzi Mandes, MD  Reason for Consultation: Establishing goals of care  HPI/Patient Profile: Roger Rodriguez is a 81 year old male with history of Parkinson's, depression, anxiety, GERD, chronic suprapubic catheter, follows with urologist, who presents emergency department for chief concerns of altered mental status.   Patient last had his suprapubic Foley catheter change at urologist outpatient approximately 2 years weeks ago.  Clinical Assessment and Goals of Care:  Notes and labs reviewed.  In to see patient, son is at bedside.  They discussed that patient's wife died from [place] in 10-01-2014.  Patient has had a full-time caregiver for approximately the past 3 to 4 years.   Discussed that patient had a knee replacement in 09/30/17.  He was having difficulty with walking.  He has been bedridden for the past few years because of difficulty walking.  Currently he is able to move from bed to chair and back to bed with assistance.  He uses a bedpan.  Son states he has days where he sleeps all day.  These days about once a week.   We discussed his diagnoses, prognosis, GOC, EOL wishes disposition and options.  Created space and opportunity for patient  to explore thoughts and feelings regarding current medical information.   A detailed discussion was had today regarding advanced directives.  Concepts specific to code status, artifical feeding and hydration, IV antibiotics and rehospitalization were discussed.  The difference between an aggressive medical intervention path and a comfort care path was discussed.  Values and goals of care important to patient and family were attempted to be elicited.  Discussed limitations of medical  interventions to prolong quality of life in some situations and discussed the concept of human mortality.  We discussed quality of life.  Patient states he is at a place where he is unable to even pick up a piece of paper, and he feels as though he is a burden on his family which she does not want to be.  He discusses a plan to shift to comfort focused care.  With further conversation he discusses wanting to treat the treatable until discharge and then shifting to hospice level care.  He states here in the hospital he does not like his dysphagia nectar thick diet.    Patient would like the evening to think about his wishes and talk to his family.     SUMMARY OF RECOMMENDATIONS   Remeet with patient at 10 AM tomorrow morning at bedside      Primary Diagnoses: Present on Admission:  Severe sepsis with acute organ dysfunction ([place])  Urinary retention  Parkinson's disease  Acute hypoxic respiratory failure (HCC)  Hyperlipidemia  (Resolved) Depression  Lethargy   I have reviewed the medical record, interviewed the patient and family, and examined the patient. The following aspects are pertinent.  Past Medical History:  Diagnosis  Date   Allergy    allergic rhinitis   Arthritis    OA   Chronic kidney disease    stones   Depression    Diabetes mellitus without complication ([place])    Family history of adverse reaction to anesthesia    Hematuria    History of kidney stones    Hyperlipidemia    Hypertension    no meds since parkinsons meds started   Incontinence of urine    Inguinal hernia    Neuromuscular disorder ([place])    parkinsons   Overactive bladder    Parkinson disease    Parkinson's disease    Prostate cancer ([place])    Rhinitis, allergic    Shortness of breath dyspnea    doe secondary to parkinsons   Social History   Socioeconomic History   Marital status: Widowed    Spouse name: Not on file   Number of children: 2   Years of education: Not on file   Highest  education level: Some college, no degree  Occupational History   Occupation: retired  Tobacco Use   Smoking status: Former    Types: Cigars    Quit date: 11/11/1968    Years since quitting: 53.4    Passive exposure: Past   Smokeless tobacco: Former    Types: Chew    Quit date: 11/12/2003   Tobacco comments:    Occassionally, also chewed tobacco till 2005. Quit smoking 1973  Vaping Use   Vaping Use: Never used  Substance and Sexual Activity   Alcohol use: No    Alcohol/week: 0.0 standard drinks of alcohol   Drug use: No   Sexual activity: Not Currently  Other Topics Concern   Not on file  Social History Narrative   Not on file   Social Determinants of Health   Financial Resource Strain: Low Risk  (08/14/2021)   Overall Financial Resource Strain (CARDIA)    Difficulty of Paying Living Expenses: Not hard at all  Food Insecurity: No Food Insecurity (08/14/2021)   Hunger Vital Sign    Worried About Running Out of Food in the Last Year: Never true    Ran Out of Food in the Last Year: Never true  Transportation Needs: No Transportation Needs (08/14/2021)   PRAPARE - Hydrologist (Medical): No    Lack of Transportation (Non-Medical): No  Physical Activity: Inactive (08/14/2021)   Exercise Vital Sign    Days of Exercise per Week: 0 days    Minutes of Exercise per Session: 0 min  Stress: No Stress Concern Present (08/14/2021)   [place]    Feeling of Stress : Only a little  Social Connections: Socially Isolated (08/14/2021)   Social Connection and Isolation Panel [NHANES]    Frequency of Communication with Friends and Family: More than three times a week    Frequency of Social Gatherings with Friends and Family: More than three times a week    Attends Religious Services: Never    Marine scientist or Organizations: No    Attends Archivist Meetings: Never    Marital Status:  Widowed   Family History  Problem Relation Age of Onset   Dementia Mother    Cerebral aneurysm Father    Bladder Cancer Paternal Uncle    Liver cancer Sister    Prostate cancer Neg Hx    Kidney cancer Neg Hx    Scheduled Meds:  amantadine  100 mg Oral Daily   ascorbic acid  1,000 mg Oral BID   Carbidopa-Levodopa ER  1 capsule Oral QID   Carbidopa-Levodopa ER  1 capsule Oral QID   cholecalciferol  1,000 Units Oral Daily   cyanocobalamin  1,000 mcg Oral Daily   enoxaparin (LOVENOX) injection  40 mg Subcutaneous J62G   folic acid  1 mg Oral Daily   gabapentin  100 mg Oral TID   influenza vaccine adjuvanted  0.5 mL Intramuscular Tomorrow-1000   insulin aspart  0-15 Units Subcutaneous TID WC   insulin aspart  0-5 Units Subcutaneous QHS   mouth rinse  15 mL Mouth Rinse 4 times per day   pramipexole  0.25 mg Oral TID   vitamin E  400 Units Oral Daily   Continuous Infusions:  cefTRIAXone (ROCEPHIN)  IV Stopped (04/21/22 0115)   PRN Meds:.acetaminophen **OR** acetaminophen, ondansetron **OR** ondansetron (ZOFRAN) IV, mouth rinse, senna-docusate Medications Prior to Admission:  Prior to Admission medications   Medication Sig Start Date End Date Taking? Authorizing Provider  amantadine (SYMMETREL) 100 MG capsule Take 100 mg by mouth daily.   Yes [provider]  Carbidopa-Levodopa ER 23.75-95 MG CPCR Take 1 capsule by mouth 4 (four) times daily. (Take with 61.'25mg'$ /'245mg'$  capsule doses)   Yes [provider]  Carbidopa-Levodopa ER 61.25-245 MG CPCR Take 1 capsule by mouth in the morning, at noon, in the evening, and at bedtime. (Take with 23.'75mg'$ /'95mg'$  capsule doses)   Yes [provider]  celecoxib (CELEBREX) 100 MG capsule TAKE 1 CAPSULE BY MOUTH TWICE A DAY 04/05/22  Yes [name]., MD  cholecalciferol (VITAMIN D) 1000 units tablet Take 1,000 Units by mouth daily.  05/19/12  Yes [provider]  folic acid (FOLVITE) 1 MG tablet Take 1 tablet  (1 mg total) by mouth daily. 08/26/21  Yes Lorella Nimrod, MD  gabapentin (NEURONTIN) 100 MG capsule TAKE 2 CAPSULES (200 MG TOTAL) BY MOUTH 3 (THREE) TIMES DAILY FOR 90 DAYS 11/13/21  Yes [name]., MD  glucose blood [facility] BLOOD GLUCOSE TEST) test strip Use as instructed 08/15/18  Yes [name]., MD  metFORMIN (GLUCOPHAGE) 500 MG tablet TAKE 1 TABLET BY MOUTH TWICE A DAY WITH MEALS 02/10/22  Yes [name]., MD  nitrofurantoin, macrocrystal-monohydrate, (MACROBID) 100 MG capsule Take 1 capsule (100 mg total) by mouth 2 (two) times daily for 7 days. 04/15/22 04/22/22 Yes McGowan, Larene Beach A, PA-C  pramipexole (MIRAPEX) 0.5 MG tablet Take 0.25 mg by mouth 3 (three) times daily.   Yes [provider]  vitamin B-12 (CYANOCOBALAMIN) 1000 MCG tablet Take 1,000 mcg by mouth daily.   Yes [provider]  vitamin C (ASCORBIC ACID) 500 MG tablet Take 1,000 mg by mouth 2 (two) times daily.   Yes [provider]  vitamin E 180 MG (400 UNITS) capsule Take 400 Units by mouth daily.    Yes [provider]   Allergies  Allergen Reactions   Sulfa Antibiotics Swelling    Lip swelling   Review of Systems  Neurological:  Positive for weakness.  All other systems reviewed and are negative.   Physical Exam Pulmonary:     Effort: Pulmonary effort is normal.  Neurological:     Mental Status: He is alert.     Vital Signs: BP 139/74 (BP Location: Right Arm)   Pulse 89   Temp 97.7 F (36.5 C) (Oral)   Resp 13   Ht 6' (  1.829 m)   Wt 86.2 kg   SpO2 98%   BMI 25.77 kg/m  Pain Scale: 0-10   Pain Score: 0-No pain   SpO2: SpO2: 98 % O2 Device:SpO2: 98 % O2 Flow Rate: .O2 Flow Rate (L/min): 1.5 L/min  IO: Intake/output summary:  Intake/Output Summary (Last 24 hours) at 04/21/2022 0857 Last data filed at 04/21/2022 0115 Gross per 24 hour  Intake 100 ml  Output --  Net 100 ml    LBM: Last BM Date : 04/19/22 Baseline Weight:  Weight: 86.2 kg Most recent weight: Weight: 86.2 kg      Signed by: Asencion Gowda, NP   Please contact Palliative Medicine Team phone at [phone] for questions and concerns.  For individual provider: See Shea Evans

## 2022-04-20 NOTE — Plan of Care (Signed)
Full note to follow. Family conversation tomorrow at 10:00. Most likely will go home with hospice.

## 2022-04-21 ENCOUNTER — Telehealth: Payer: Self-pay | Admitting: Family Medicine

## 2022-04-21 DIAGNOSIS — R6520 Severe sepsis without septic shock: Secondary | ICD-10-CM | POA: Diagnosis not present

## 2022-04-21 DIAGNOSIS — A419 Sepsis, unspecified organism: Secondary | ICD-10-CM | POA: Diagnosis not present

## 2022-04-21 DIAGNOSIS — Z7189 Other specified counseling: Secondary | ICD-10-CM | POA: Diagnosis not present

## 2022-04-21 LAB — GLUCOSE, CAPILLARY
Glucose-Capillary: 133 mg/dL — ABNORMAL HIGH (ref 70–99)
Glucose-Capillary: 165 mg/dL — ABNORMAL HIGH (ref 70–99)
Glucose-Capillary: 113 mg/dL — ABNORMAL HIGH (ref 70–99)
Glucose-Capillary: 136 mg/dL — ABNORMAL HIGH (ref 70–99)

## 2022-04-21 LAB — CREATININE, SERUM
Creatinine, Ser: 0.59 mg/dL — ABNORMAL LOW (ref 0.61–1.24)
GFR, Estimated: >60 mL/min (ref >60)

## 2022-04-21 MED ORDER — VITAMIN C 500 MG PO TABS
1000.0000 mg | ORAL_TABLET | Freq: Two times a day (BID) | ORAL | Status: DC
  Administered 2022-04-21 – 2022-04-22 (×3): 1000 mg via ORAL
  Filled 2022-04-21 (×3): qty 2

## 2022-04-21 MED ORDER — VITAMIN B-12 1000 MCG PO TABS
1000.0000 ug | ORAL_TABLET | Freq: Every day | ORAL | Status: DC
  Administered 2022-04-21 – 2022-04-22 (×2): 1000 ug via ORAL
  Filled 2022-04-21 (×2): qty 1

## 2022-04-21 MED ORDER — VITAMIN D 25 MCG (1000 UNIT) PO TABS
1000.0000 [IU] | ORAL_TABLET | Freq: Every day | ORAL | Status: DC
  Administered 2022-04-21 – 2022-04-22 (×2): 1000 [IU] via ORAL
  Filled 2022-04-21 (×2): qty 1

## 2022-04-21 MED ORDER — VITAMIN E 180 MG (400 UNIT) PO CAPS
400.0000 [IU] | ORAL_CAPSULE | Freq: Every day | ORAL | Status: DC
  Administered 2022-04-22: 400 [IU] via ORAL
  Filled 2022-04-21 (×2): qty 1

## 2022-04-21 MED ORDER — FOLIC ACID 1 MG PO TABS
1.0000 mg | ORAL_TABLET | Freq: Every day | ORAL | Status: DC
  Administered 2022-04-21 – 2022-04-22 (×2): 1 mg via ORAL
  Filled 2022-04-21 (×2): qty 1

## 2022-04-21 MED ORDER — CEPHALEXIN 500 MG PO CAPS
500.0000 mg | ORAL_CAPSULE | Freq: Three times a day (TID) | ORAL | Status: DC
  Administered 2022-04-22: 500 mg via ORAL
  Filled 2022-04-21: qty 1

## 2022-04-21 NOTE — Plan of Care (Signed)

## 2022-04-21 NOTE — Progress Notes (Addendum)
Daily Progress Note   Patient Name: Roger Rodriguez       Date: 04/21/2022 DOB: Sep 01, 1940  Age: 81 y.o. MRN#: [id number] Attending Physician: Fritzi Mandes, MD Primary Care Physician: [name]., MD Admit Date: 04/19/2022  Reason for Consultation/Follow-up: Establishing goals of care  Subjective: Notes and labs reviewed. In to see patient.  Patient's 2 sons were present at bedside.  Patient's pain caregiver was also present at bedside.  We discussed his status and updates.  We recapped the conversation from yesterday.  Multiple scenarios discussed and questions answered.  Caregiver's questions answered regarding patient's care.  Patient would like to go home with hospice to follow there.  He would like to continue his current antibiotic therapy until completion.  He would like to eat and drink on his terms as he does not like the dysphagia nectar thick diet.  He does not wish to return to the hospital.    I completed a MOST form today with patient and both sons.  It was signed by son as patient is unable to sign for himself due to lack of strength.  And the signed original was placed in the chart. The form was scanned and sent to medical records for it to be uploaded under ACP tab in Epic. A photocopy was also placed in the chart to be scanned into EMR. The patient outlined their wishes for the following treatment decisions:  Cardiopulmonary Resuscitation: Do Not Attempt Resuscitation (DNR/No CPR)  Medical Interventions: Comfort Measures: Keep clean, warm, and dry. Use medication by any route, positioning, wound care, and other measures to relieve pain and suffering. Use oxygen, suction and manual treatment of airway obstruction as needed for comfort. Do not transfer to the hospital unless  comfort needs cannot be met in current location.  Antibiotics: Determine use of limitation of antibiotics when infection occurs Continue current course.   IV Fluids: No IV fluids (provide other measures to ensure comfort)  Feeding Tube: No feeding tube     Length of Stay: 2  Current Medications: Scheduled Meds:   amantadine  100 mg Oral Daily   ascorbic acid  1,000 mg Oral BID   Carbidopa-Levodopa ER  1 capsule Oral QID   Carbidopa-Levodopa ER  1 capsule Oral QID   cholecalciferol  1,000 Units Oral Daily  cyanocobalamin  1,000 mcg Oral Daily   enoxaparin (LOVENOX) injection  40 mg Subcutaneous A56P   folic acid  1 mg Oral Daily   gabapentin  100 mg Oral TID   influenza vaccine adjuvanted  0.5 mL Intramuscular Tomorrow-1000   insulin aspart  0-15 Units Subcutaneous TID WC   insulin aspart  0-5 Units Subcutaneous QHS   mouth rinse  15 mL Mouth Rinse 4 times per day   pramipexole  0.25 mg Oral TID   vitamin E  400 Units Oral Daily    Continuous Infusions:  cefTRIAXone (ROCEPHIN)  IV Stopped (04/21/22 0115)    PRN Meds: acetaminophen **OR** acetaminophen, ondansetron **OR** ondansetron (ZOFRAN) IV, mouth rinse, senna-docusate  Physical Exam Pulmonary:     Effort: Pulmonary effort is normal.  Neurological:     Mental Status: He is alert.             Vital Signs: BP 114/68 (BP Location: Right Arm)   Pulse 84   Temp 98.7 F (37.1 C) (Oral)   Resp 18   Ht 6' (1.829 m)   Wt 86.2 kg   SpO2 94%   BMI 25.77 kg/m  SpO2: SpO2: 94 % O2 Device: O2 Device: Room Air O2 Flow Rate: O2 Flow Rate (L/min): 1.5 L/min  Intake/output summary:  Intake/Output Summary (Last 24 hours) at 04/21/2022 1226 Last data filed at 04/21/2022 1213 Gross per 24 hour  Intake 100 ml  Output 1000 ml  Net -900 ml   LBM: Last BM Date : 04/19/22 Baseline Weight: Weight: 86.2 kg Most recent weight: Weight: 86.2 kg    Patient Active Problem List   Diagnosis Date Noted   Severe sepsis with  acute organ dysfunction ([place]) 04/19/2022   Acute hypoxic respiratory failure ([place]) 04/19/2022   Diabetes mellitus type 2, noninsulin dependent ([place]) 04/19/2022   Lethargy 04/19/2022   Diarrhea 09/05/2021   Suprapubic catheter ([place]) 09/05/2021   Urinary retention    Edema of left upper extremity 08/25/2021   Pressure injury of skin 08/23/2021   Folate deficiency anemia 08/23/2021   Sepsis ([place]) 79/48/0165   Complicated UTI (urinary tract infection) 08/22/2021   Diabetes mellitus type 2 in nonobese ([place]) 08/22/2021   Intermittent pain and swelling of hand 08/06/2021   Decreased grip strength 08/06/2021   Pressure injury of contiguous region involving back and buttock, stage 2 ([place]) 08/06/2021   Acute hypoxemic respiratory failure due to COVID-19 ([place]) 07/09/2021   S/P revision of total knee, 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   Chronic venous insufficiency 11/22/2017   AKI (acute kidney injury) ([place]) 10/12/2017   RUQ pain 10/22/2016   Depression, major, single episode, moderate ([place]) 10/22/2016   Renal cyst, right 09/28/2016   Hepatic steatosis 09/28/2016   Parkinson's disease 09/18/2015   Restless leg 09/18/2015   Malignant neoplasm of prostate ([place]) 05/22/2015   Allergic rhinitis 04/02/2015   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  Continue remainder of current antibiotic therapy. They discussed discharging home tomorrow as they are concerned about a very late  discharge home, as has happened  previously. Would recommend Robinul prescription at discharge for excessive secretions, as well as oral morphine, and Ativan.  Code Status:    Code Status Orders  (From admission, onward)           Start     Ordered   04/19/22 1346  Do not attempt resuscitation (DNR)  Continuous       Question Answer Comment  In the event of cardiac or respiratory ARREST Do not call a "code blue"   In the event of cardiac or respiratory ARREST Do not perform Intubation, CPR, defibrillation or ACLS   In the event of cardiac or respiratory ARREST Use medication by any route, position, wound care, and other measures to relive pain and suffering. May use oxygen, suction and manual treatment of airway obstruction as needed for comfort.      04/19/22 1347           Code Status History     Date Active Date Inactive Code Status Order ID Comments User Context   04/19/2022 1331 04/19/2022 1347 DNR [id number]  Vanessa [place], MD ED   08/22/2021 1722 08/27/2021 1517 DNR [id number]  Rise Patience, MD ED   07/09/2021 2009 07/14/2021 2259 Full Code [id number]  Cox, Amy N, DO ED   06/08/2018 1736 06/10/2018 2259 Full Code [id number]  Lovell Sheehan, MD Inpatient   03/17/2018 1756 03/21/2018 1910 Full Code [id number]  Lovell Sheehan, MD Inpatient   10/12/2017 2320 10/17/2017 2035 Full Code [id number]  Amelia Jo, MD Inpatient      Advance Directive Documentation    Flowsheet Row Most Recent Value  Type of Advance Directive Out of facility DNR (pink MOST or yellow form)  Pre-existing out of facility DNR order (yellow form or pink MOST form) --  "MOST" Form in Place? --       Prognosis:  < 3 months    Care plan was discussed with team members via epic chat.   Thank you for allowing the Palliative Medicine Team to assist in the care of this patient.   Asencion Gowda, NP  Please contact Palliative Medicine Team phone at [phone] for questions and concerns.

## 2022-04-21 NOTE — Telephone Encounter (Signed)
Wants to know if MD will serve as Hospice Care attending Wants to know if he will authorize comfort care orders  Needs verbals to state if the patient has a life expectancy of 6-8 months or less if the illness runs its usual course.   Best contact: [phone] Myrtha Mantis from [facility] needs verbals please advise

## 2022-04-21 NOTE — Progress Notes (Addendum)
[name] [place] NAME: Roger Rodriguez    MR#:  [id number]  DATE OF BIRTH:  [birth year]  SUBJECTIVE:  Private caregiver at bedside. Patient worked with Astr[employer]. No fever   VITALS:  Blood pressure 114/68, pulse 84, temperature 98.7 F (37.1 C), temperature source Oral, resp. rate 18, height 6' (1.829 m), weight 86.2 kg, SpO2 94 %.  PHYSICAL EXAMINATION:   GENERAL:  81 y.o.-year-old patient lying in the bed with no acute distress.  LUNGS: Normal breath sounds bilaterally, no wheezing CARDIOVASCULAR: S1, S2 normal. No murmurs,   ABDOMEN: Soft, nontender, nondistended. Bowel sounds present.  Suprapubic catheter site looks okay EXTREMITIES: No  edema b/l.    NEUROLOGIC: nonfocal  patient is alert and awake, parkinsons facies SKIN: per RN  LABORATORY PANEL:  CBC Recent Labs  Lab 04/20/22 0508  WBC 9.4  HGB 13.7  HCT 42.7  PLT 175     Chemistries  Recent Labs  Lab 04/19/22 1127 04/20/22 0508 04/21/22 0437  NA 135 136  --   K 3.7 3.7  --   CL 100 105  --   CO2 22 26  --   GLUCOSE 171* 136*  --   BUN 24* 18  --   CREATININE 1.01 0.73 0.59*  CALCIUM 8.3* 8.0*  --   AST 11*  --   --   ALT <5  --   --   ALKPHOS 56  --   --   BILITOT 1.5*  --   --     Cardiac Enzymes No results for input(s): "TROPONINI" in the last 168 hours. RADIOLOGY:  MR BRAIN WO CONTRAST  Result Date: 04/19/2022 CLINICAL DATA:  Mental status change, unknown cause. EXAM: MRI HEAD WITHOUT CONTRAST TECHNIQUE: Multiplanar, multiecho pulse sequences of the brain and surrounding structures were obtained without intravenous contrast. COMPARISON:  CT head without contrast 04/19/2022. MR head without contrast 10/13/2017 FINDINGS: Brain: No acute infarct, hemorrhage, or mass lesion is present. Mild atrophy and white matter changes are within normal limits for age. Cavum septum pellucidum again noted. The cranial clear are within normal limits. Ventricles  are of normal size. No significant extraaxial fluid collection is present. The internal auditory canals are within normal limits. One additional remote lacunar infarct is present in each cerebellum since the prior exam. No acute infarcts are present. Vascular: Flow is present in the major intracranial arteries. Skull and upper cervical spine: The craniocervical junction is normal. Upper cervical spine is within normal limits. Marrow signal is unremarkable. Sinuses/Orbits: A polyp or mucous retention cyst is present the right maxillary sinus. The paranasal sinuses and mastoid air cells are otherwise clear. Bilateral lens replacements are noted. Globes and orbits are otherwise unremarkable. IMPRESSION: 1. No acute intracranial abnormality or significant interval change. 2. 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 Electronically Signed   By: San Morelle M.D.   On: 04/19/2022 16:44    Assessment and Plan  Roger Rodriguez is a 81 year old male with history of Parkinson's, depression, anxiety, GERD, chronic suprapubic catheter, follows with urologist, who presents emergency department for chief concerns of altered mental status.   Initial vitals in the emergency department showed temperature of 102.7, tachycardia  patient last had his suprapubic Foley catheter change at urologist outpatient approximately 2 years weeks ago.  CTA of the chest is read as no acute findings are identified in the chest, abdomen, pelvis.  Bilateral calculi nonobstructing.  Patchy dependent lung base favoring atelectasis.  Suprapubic bladder catheter in place.  Interval improvement of hepatic steatosis.   CT of the head without contrast: Was read as no acute intracranial abnormalities.  Severe sepsis due to UTI in the setting of chronic suprapubic catheter with acute organ dysfunction [facility]) - Patient met criteria for severe sepsis with fever, sinus tachycardia, leukocytosis,  source of urine and new oxygen requirement - Blood cultures x2  negative --UC multiple species -- urine culture from October 4 (urology) shows Klebsiella species pain sensitive - Procalcitonin neg - IV Rocephin--change to po from am (total 7 days) -- patient is awake alert and appears at baseline. --  white count normal --sepsis resolved  Acute hypoxic respiratory failure (HCC)--resolved - Continue oxygen supplementation to maintain SPO2 greater than 92% - likely the setting of above.  --pt is on RA   Diabetes mellitus type 2, noninsulin dependent ([place]) - Patient takes metformin  - Insulin SSI with agents coverage ordered - A1c 6.2%   Urinary retention - Chronic suprapubic Foley catheter requirement - Approximate change was 2 weeks ago with urology   Parkinson's disease dysphagia - Patient takes carbidopa-levodopa - Gabapentin,Amantadine, Pramipexole  -- seen by speech therapist. Recommendations noted.  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 -Patient does have caregiver at home 70 x7   DVT prophylaxis: Enoxaparin Code Status: DNR   pt is followed by palliative care as outpatient. Palliative care met with patient's sons who are agreeable with hospice service at home. Patient is DNR. Will discharge tomorrow with EMS transportation earlier in the day.  Family communication : spoke with Reather Littler  Level of care: Progressive Status is: Inpatient Remains inpatient appropriate because: Sepsis ED 04/22/2022    TOTAL TIME TAKING CARE OF THIS PATIENT: 35 minutes.  >50% time spent on counselling and coordination of care  Note: This dictation was prepared with Dragon dictation along with smaller phrase technology. Any transcriptional errors that result from this process are unintentional.  Fritzi Mandes M.D    [facility]   CC: Primary care physician; [name]., MD

## 2022-04-22 DIAGNOSIS — A419 Sepsis, unspecified organism: Secondary | ICD-10-CM | POA: Diagnosis not present

## 2022-04-22 DIAGNOSIS — R6520 Severe sepsis without septic shock: Secondary | ICD-10-CM | POA: Diagnosis not present

## 2022-04-22 LAB — GLUCOSE, CAPILLARY: Glucose-Capillary: 120 mg/dL — ABNORMAL HIGH (ref 70–99)

## 2022-04-22 MED ORDER — CEPHALEXIN 500 MG PO CAPS: 500.0000 mg | ORAL_CAPSULE | Freq: Three times a day (TID) | ORAL | 0 refills | Status: AC

## 2022-04-22 NOTE — Telephone Encounter (Signed)
Please Review and advise 

## 2022-04-22 NOTE — Telephone Encounter (Signed)
Okay for hospice requested verbal orders.  Roger Foster, MD [facility]

## 2022-04-22 NOTE — Discharge Summary (Signed)
Physician Discharge Summary   Roger Rodriguez  male DOB: 03/13/1941  LNL:[id number]  PCP: Roger Rodriguez., MD  Admit date: 04/19/2022 Discharge date: 04/22/2022  Admitted From: home Disposition:  home with hospice CODE STATUS: DNR  Discharge Instructions     No wound care   Complete by: As directed       Hospital Course:  For full details, please see H&P, progress notes, consult notes and ancillary notes.  Briefly,  Roger Rodriguez is a 81 year old male with history of Parkinson's, depression, anxiety, chronic suprapubic catheter, follows with urologist, who presented emergency department for chief concerns of altered mental status.   Initial vitals in the emergency department showed temperature of 102.7, tachycardia   patient last had his suprapubic Foley catheter change at urologist outpatient approximately 2 years weeks ago.   Severe sepsis due to UTI in the setting of chronic suprapubic catheter with acute organ dysfunction [facility]) - Patient met criteria for severe sepsis with fever, sinus tachycardia, leukocytosis, source of urine and new oxygen requirement - Blood cultures x2 negative --UC multiple species -- urine culture from October 4 (urology) shows Klebsiella species pain sensitive - Procalcitonin neg --pt received vanc/cefe/flagyl on presentation, then 2 days of ceftriaxone and discharged on Keflex to finish 7-day course.   Acute hypoxic respiratory failure (HCC)--resolved Per EMS, RA sat 87% on RA, placed on 2L [place].  On room air prior to discharge.   Diabetes mellitus type 2, noninsulin dependent (HCC) - A1c 6.2% --resume metformin after discharge.   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 -Patient does have caregiver at home 64 x7   Discharge Diagnoses:  Principal Problem:   Severe sepsis with acute organ dysfunction ([place]) Active Problems:   Acute hypoxic respiratory failure ([place])   S/P revision of total knee, right   Hyperlipidemia   Parkinson's disease   Urinary retention   Suprapubic catheter ([place])   Diabetes mellitus type 2, noninsulin dependent ([place])   Lethargy   30 Day Unplanned Readmission Risk Score    Flowsheet Row ED to Hosp-Admission (Current) from 04/19/2022 in [place] [facility]  30 Day Unplanned Readmission Risk Score (%) 18.98 Filed at 04/22/2022 0801       This score is the patient's risk of an unplanned readmission within 30 days of being discharged (0 -100%). The score is based on dignosis, age, lab data, medications, orders, and past utilization.   Low:  0-14.9   Medium: 15-21.9   High: 22-29.9   Extreme: 30 and above         Discharge Instructions:  Allergies as of 04/22/2022       Reactions   Sulfa Antibiotics Swelling   Lip swelling        Medication List     STOP taking these medications    nitrofurantoin (macrocrystal-monohydrate) 100 MG capsule Commonly known as: Macrobid       TAKE these medications    amantadine 100 MG capsule Commonly known as: SYMMETREL Take 100 mg by mouth daily.   ascorbic acid 500 MG tablet Commonly known as: VITAMIN C Take 1,000 mg by mouth 2 (two) times daily.   Carbidopa-Levodopa ER 23.75-95 MG Cpcr Take 1 capsule by  mouth 4 (four) times daily. (Take with 61.35m/245mg capsule doses)   Carbidopa-Levodopa ER 61.25-245 MG Cpcr Take 1 capsule by mouth in the morning, at noon, in the evening, and at bedtime. (Take with 23.764m95mg capsule doses)   celecoxib 100 MG capsule Commonly known as: CELEBREX TAKE 1 CAPSULE BY MOUTH TWICE A DAY   cephALEXin 500 MG capsule Commonly known as: KEFLEX Take 1 capsule (500 mg total) by mouth 4  (four) times daily -  with meals and at bedtime for 4 days.   cholecalciferol 1000 units tablet Commonly known as: VITAMIN D Take 1,000 Units by mouth daily.   cyanocobalamin 1000 MCG tablet Commonly known as: VITAMIN B12 Take 1,000 mcg by mouth daily.   folic acid 1 MG tablet Commonly known as: FOLVITE Take 1 tablet (1 mg total) by mouth daily.   gabapentin 100 MG capsule Commonly known as: NEURONTIN TAKE 2 CAPSULES (200 MG TOTAL) BY MOUTH 3 (THREE) TIMES DAILY FOR 90 DAYS   glucose blood test strip Commonly known as: Quintet AC Blood Glucose Test Use as instructed   metFORMIN 500 MG tablet Commonly known as: GLUCOPHAGE TAKE 1 TABLET BY MOUTH TWICE A DAY WITH MEALS   pramipexole 0.5 MG tablet Commonly known as: MIRAPEX Take 0.25 mg by mouth 3 (three) times daily.   vitamin E 180 MG (400 UNITS) capsule Take 400 Units by mouth daily.         Follow-up Information     GiJerrol Banana MD Follow up in 1 week(s).   Specialty: Family Medicine Contact information: [address] [phone]               Allergies  Allergen Reactions   Sulfa Antibiotics Swelling    Lip swelling     The results of significant diagnostics from this hospitalization (including imaging, microbiology, ancillary and laboratory) are listed below for reference.   Consultations:   Procedures/Studies: MR BRAIN WO CONTRAST  Result Date: 04/19/2022 CLINICAL DATA:  Mental status change, unknown cause. EXAM: MRI HEAD WITHOUT CONTRAST TECHNIQUE: Multiplanar, multiecho pulse sequences of the brain and surrounding structures were obtained without intravenous contrast. COMPARISON:  CT head without contrast 04/19/2022. MR head without contrast 10/13/2017 FINDINGS: Brain: No acute infarct, hemorrhage, or mass lesion is present. Mild atrophy and white matter changes are within normal limits for age. Cavum septum pellucidum again noted. The cranial  clear are within normal limits. Ventricles are of normal size. No significant extraaxial fluid collection is present. The internal auditory canals are within normal limits. One additional remote lacunar infarct is present in each cerebellum since the prior exam. No acute infarcts are present. Vascular: Flow is present in the major intracranial arteries. Skull and upper cervical spine: The craniocervical junction is normal. Upper cervical spine is within normal limits. Marrow signal is unremarkable. Sinuses/Orbits: A polyp or mucous retention cyst is present the right maxillary sinus. The paranasal sinuses and mastoid air cells are otherwise clear. Bilateral lens replacements are noted. Globes and orbits are otherwise unremarkable. IMPRESSION: 1. No acute intracranial abnormality or significant interval change. 2. 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 Electronically Signed   By: ChSan Morelle.D.   On: 04/19/2022 16:44   CT CHEST ABDOMEN PELVIS WO CONTRAST  Result Date: 04/19/2022 CLINICAL DATA:  Possible sepsis EXAM: CT CHEST, ABDOMEN AND PELVIS WITHOUT CONTRAST TECHNIQUE: Multidetector CT imaging of the chest, abdomen  and pelvis was performed following the standard protocol without IV contrast. RADIATION DOSE REDUCTION: This exam was performed according to the departmental dose-optimization program which includes automated exposure control, adjustment of the mA and/or kV according to patient size and/or use of iterative reconstruction technique. COMPARISON:  Abdominopelvic CT 10/30/2015. FINDINGS: CT CHEST FINDINGS Cardiovascular: Atherosclerosis of the aorta, great vessels and coronary arteries. No acute vascular findings on noncontrast imaging. There are calcifications of the aortic valve and mitral annulus. The heart size is normal. There is no pericardial effusion. Mediastinum/Nodes: There are no enlarged mediastinal, hilar or axillary lymph nodes.  The thyroid gland, trachea and esophagus demonstrate no significant findings. Lungs/Pleura: No pleural effusion or pneumothorax. Streaky opacities at both lung bases, most consistent with atelectasis. There is mild central airway thickening. No confluent airspace opacity or suspicious pulmonary nodularity. Musculoskeletal/Chest wall: No chest wall mass or suspicious osseous findings. Postsurgical changes at the right shoulder. Mild multilevel thoracic spondylosis. CT ABDOMEN AND PELVIS FINDINGS Hepatobiliary: Compared with the prior CT, appendix steatosis has improved. No focal abnormality identified on noncontrast imaging. No evidence of gallstones, gallbladder wall thickening or biliary dilatation. Pancreas: Atrophied with fatty replacement. No focal abnormality or surrounding inflammation. Spleen: Normal in size without focal abnormality. Adrenals/Urinary Tract: Both adrenal glands appear normal. There are small nonobstructing bilateral renal calculi. Bilateral renal cysts are noted which are similar to the previous study and do not require any specific imaging follow-up. There is mild chronic dilatation of the left renal pelvis and left ureter, but no recurrent ureteral calculi are identified. There is no perinephric soft tissue stranding. A suprapubic bladder catheter is in place. The bladder is decompressed with possible wall thickening. No evidence of bladder calculus. Stomach/Bowel: No enteric contrast administered. The stomach appears unremarkable for its degree of distension. No evidence of bowel wall thickening, distention or surrounding inflammatory change. Diverticulum of the 3rd portion of the duodenum. Moderate stool throughout the colon. Vascular/Lymphatic: There are no enlarged abdominal or pelvic lymph nodes. Aortic and branch vessel atherosclerosis without evidence of significant aneurysm. Reproductive: Previous prostatectomy without evidence of recurrent mass. Other: No evidence of abdominal wall  mass or hernia. No ascites. Musculoskeletal: No acute or significant osseous findings. Multilevel lumbar spondylosis with multilevel postsurgical changes inferiorly. Patient has developed generalized muscular atrophy. IMPRESSION: 1. No acute findings are identified in the chest, abdomen or pelvis. 2. Patchy dependent opacities at the lung bases, favored to reflect atelectasis. 3. Nonobstructing bilateral renal calculi. Mild chronic dilatation of the left renal pelvis and left ureter without evidence of recurrent ureteral calculus or perinephric soft tissue stranding. 4. Suprapubic bladder catheter in place. The bladder is decompressed with possible wall thickening, nonspecific. Correlate with urine analysis. 5. Interval improvement in hepatic steatosis. 6.  Aortic Atherosclerosis (ICD10-I70.0). Electronically Signed   By: Richardean Sale M.D.   On: 04/19/2022 13:01   CT HEAD WO CONTRAST (5MM)  Result Date: 04/19/2022 CLINICAL DATA:  Mental status change of unknown cause. EXAM: CT HEAD WITHOUT CONTRAST TECHNIQUE: Contiguous axial images were obtained from the base of the skull through the vertex without intravenous contrast. RADIATION DOSE REDUCTION: This exam was performed according to the departmental dose-optimization program which includes automated exposure control, adjustment of the mA and/or kV according to patient size and/or use of iterative reconstruction technique. COMPARISON:  10/12/2017. FINDINGS: Brain: No evidence of acute infarction, hemorrhage, hydrocephalus, extra-axial collection or mass lesion/mass effect. Vascular: No hyperdense vessel or unexpected calcification. Skull: Normal. Negative for fracture or focal lesion. Sinuses/Orbits:  Globes and orbits are unremarkable. Mucous retention cysts in the floor the right maxillary sinus. Minor ethmoid sinus mucosal thickening. Other: None. IMPRESSION: 1. No acute intracranial abnormalities. Stable appearance from the prior head CT. Electronically  Signed   By: Lajean Manes M.D.   On: 04/19/2022 12:57   DG Chest Port 1 View  Result Date: 04/19/2022 CLINICAL DATA:  Possible sepsis EXAM: PORTABLE CHEST 1 VIEW COMPARISON:  Previous studies including the examination of 08/22/2021 FINDINGS: Cardiac size is within normal limits. There are no signs of pulmonary edema or new focal infiltrates. There is no pleural effusion or pneumothorax. Degenerative changes are noted in both shoulders. IMPRESSION: There are no signs of pulmonary edema or new focal infiltrates. Electronically Signed   By: Elmer Picker M.D.   On: 04/19/2022 12:03      Labs: BNP (last 3 results) Recent Labs    07/09/21 1340  BNP 952.8*   Basic Metabolic Panel: Recent Labs  Lab 04/19/22 1127 04/20/22 0508 04/21/22 0437  NA 135 136  --   K 3.7 3.7  --   CL 100 105  --   CO2 22 26  --   GLUCOSE 171* 136*  --   BUN 24* 18  --   CREATININE 1.01 0.73 0.59*  CALCIUM 8.3* 8.0*  --    Liver Function Tests: Recent Labs  Lab 04/19/22 1127  AST 11*  ALT <5  ALKPHOS 56  BILITOT 1.5*  PROT 7.9  ALBUMIN 3.8   No results for input(s): "LIPASE", "AMYLASE" in the last 168 hours. No results for input(s): "AMMONIA" in the last 168 hours. CBC: Recent Labs  Lab 04/19/22 1127 04/20/22 0508  WBC 11.8* 9.4  NEUTROABS 9.7*  --   HGB 16.1 13.7  HCT 48.9 42.7  MCV 93.9 93.4  PLT 231 175   Cardiac Enzymes: No results for input(s): "CKTOTAL", "CKMB", "CKMBINDEX", "TROPONINI" in the last 168 hours. BNP: Invalid input(s): "POCBNP" CBG: Recent Labs  Lab 04/20/22 2154 04/21/22 0725 04/21/22 1127 04/21/22 1658 04/21/22 2040  GLUCAP 180* 133* 165* 113* 136*   D-Dimer Recent Labs    04/19/22 1127  DDIMER 3.75*   Hgb A1c Recent Labs    04/20/22 0508  HGBA1C 6.2*   Lipid Profile No results for input(s): "CHOL", "HDL", "LDLCALC", "TRIG", "CHOLHDL", "LDLDIRECT" in the last 72 hours. Thyroid function studies No results for input(s): "TSH", "T4TOTAL",  "T3FREE", "THYROIDAB" in the last 72 hours.  Invalid input(s): "FREET3" Anemia work up No results for input(s): "VITAMINB12", "FOLATE", "FERRITIN", "TIBC", "IRON", "RETICCTPCT" in the last 72 hours. Urinalysis    Component Value Date/Time   COLORURINE YELLOW (A) 04/19/2022 1127   APPEARANCEUR CLOUDY (A) 04/19/2022 1127   APPEARANCEUR Cloudy (A) 04/15/2022 1010   LABSPEC 1.017 04/19/2022 1127   LABSPEC 1.013 03/21/2014 1433   PHURINE 5.0 04/19/2022 1127   GLUCOSEU NEGATIVE 04/19/2022 1127   GLUCOSEU Negative 03/21/2014 1433   HGBUR MODERATE (A) 04/19/2022 1127   BILIRUBINUR NEGATIVE 04/19/2022 1127   BILIRUBINUR Negative 02/04/2022 1353   BILIRUBINUR Negative 03/21/2014 1433   KETONESUR 20 (A) 04/19/2022 1127   PROTEINUR NEGATIVE 04/19/2022 1127   UROBILINOGEN 0.2 06/02/2018 1601   NITRITE NEGATIVE 04/19/2022 1127   LEUKOCYTESUR LARGE (A) 04/19/2022 1127   LEUKOCYTESUR Trace 03/21/2014 1433   Sepsis Labs Recent Labs  Lab 04/19/22 1127 04/20/22 0508  WBC 11.8* 9.4   Microbiology Recent Results (from the past 240 hour(s))  CULTURE, URINE COMPREHENSIVE     Status: Abnormal  Collection Time: 04/15/22 10:10 AM   Specimen: Urine   UR  Result Value Ref Range Status   Urine Culture, Comprehensive Final report (A)  Final   Organism ID, Bacteria Klebsiella pneumoniae (A)  Final    Comment: Cefazolin <=4 ug/mL Cefazolin with an MIC <=16 predicts susceptibility to the oral agents cefaclor, cefdinir, cefpodoxime, cefprozil, cefuroxime, cephalexin, and loracarbef when used for therapy of uncomplicated urinary tract infections due to E. coli, Klebsiella pneumoniae, and Proteus mirabilis. Greater than 100,000 colony forming units per mL    Organism ID, Bacteria Comment  Final    Comment: Mixed urogenital flora 10,000-25,000 colony forming units per mL    ANTIMICROBIAL SUSCEPTIBILITY Comment  Final    Comment:       ** S = Susceptible; I = Intermediate; R = Resistant **                     P = Positive; N = Negative             MICS are expressed in micrograms per mL    Antibiotic                 RSLT#1    RSLT#2    RSLT#3    RSLT#4 Amoxicillin/Clavulanic Acid    S Ampicillin                     R Cefepime                       S Ceftriaxone                    S Cefuroxime                     S Ciprofloxacin                  S Ertapenem                      S Gentamicin                     S Imipenem                       S Levofloxacin                   S Meropenem                      S Nitrofurantoin                 S Piperacillin/Tazobactam        S Tetracycline                   S Tobramycin                     S Trimethoprim/Sulfa             S   Microscopic Examination     Status: Abnormal   Collection Time: 04/15/22 10:10 AM   Urine  Result Value Ref Range Status   WBC, UA >30 (A) 0 - 5 /hpf Final   RBC, Urine >30 (A) 0 - 2 /hpf Final   Epithelial Cells (non renal) 0-10 0 - 10 /hpf Final   Bacteria, UA Many (A) None seen/Few Final  Resp Panel  by RT-PCR (Flu A&B, Covid) Anterior Nasal Swab     Status: None   Collection Time: 04/19/22 11:27 AM   Specimen: Anterior Nasal Swab  Result Value Ref Range Status   SARS Coronavirus 2 by RT PCR NEGATIVE NEGATIVE Final    Comment: (NOTE) SARS-CoV-2 target nucleic acids are NOT DETECTED.  The SARS-CoV-2 RNA is generally detectable in upper respiratory specimens during the acute phase of infection. The lowest concentration of SARS-CoV-2 viral copies this assay can detect is 138 copies/mL. A negative result does not preclude SARS-Cov-2 infection and should not be used as the sole basis for treatment or other patient management decisions. A negative result may occur with  improper specimen collection/handling, submission of specimen other than nasopharyngeal swab, presence of viral mutation(s) within the areas targeted by this assay, and inadequate number of viral copies(<138 copies/mL). A negative  result must be combined with clinical observations, patient history, and epidemiological information. The expected result is Negative.  Fact Sheet for Patients:  EntrepreneurPulse.com.au  Fact Sheet for Healthcare Providers:  IncredibleEmployment.be  This test is no t yet approved or cleared by the [place] FDA and  has been authorized for detection and/or diagnosis of SARS-CoV-2 by FDA under an Emergency Use Authorization (EUA). This EUA will remain  in effect (meaning this test can be used) for the duration of the COVID-19 declaration under Section 564(b)(1) of the Act, 21 U.S.C.section 360bbb-3(b)(1), unless the authorization is terminated  or revoked sooner.       Influenza A by PCR NEGATIVE NEGATIVE Final   Influenza B by PCR NEGATIVE NEGATIVE Final    Comment: (NOTE) The Xpert Xpress SARS-CoV-2/FLU/RSV plus assay is intended as an aid in the diagnosis of influenza from Nasopharyngeal swab specimens and should not be used as a sole basis for treatment. Nasal washings and aspirates are unacceptable for Xpert Xpress SARS-CoV-2/FLU/RSV testing.  Fact Sheet for Patients: EntrepreneurPulse.com.au  Fact Sheet for Healthcare Providers: IncredibleEmployment.be  This test is not yet approved or cleared by the [place] FDA and has been authorized for detection and/or diagnosis of SARS-CoV-2 by FDA under an Emergency Use Authorization (EUA). This EUA will remain in effect (meaning this test can be used) for the duration of the COVID-19 declaration under Section 564(b)(1) of the Act, 21 U.S.C. section 360bbb-3(b)(1), unless the authorization is terminated or revoked.  Performed at [facility], [address] [place], [place] [address]   Blood Culture (routine x 2)     Status: None (Preliminary result)   Collection Time: 04/19/22 11:27 AM   Specimen: Left Antecubital; Blood  Result  Value Ref Range Status   Specimen Description LEFT ANTECUBITAL  Final   Special Requests   Final    BOTTLES DRAWN AEROBIC AND ANAEROBIC Blood Culture adequate volume   Culture   Final    NO GROWTH 3 DAYS Performed at [facility], [address], [place], [place] [address]    Report Status PENDING  Incomplete  Blood Culture (routine x 2)     Status: None (Preliminary result)   Collection Time: 04/19/22 11:27 AM   Specimen: BLOOD RIGHT HAND  Result Value Ref Range Status   Specimen Description BLOOD RIGHT HAND  Final   Special Requests   Final    BOTTLES DRAWN AEROBIC AND ANAEROBIC Blood Culture adequate volume   Culture   Final    NO GROWTH 3 DAYS Performed at [facility], [address] [place],  [address]  Report Status PENDING  Incomplete  Urine Culture     Status: Abnormal (Preliminary result)   Collection Time: 04/19/22 11:27 AM   Specimen: Urine, Clean Catch  Result Value Ref Range Status   Specimen Description   Final    URINE, CLEAN CATCH Performed at [facility], [address] [place], [place] [address]    Special Requests   Final    NONE Performed at [facility], [address] [place], [place] [address]    Culture MULTIPLE SPECIES PRESENT, SUGGEST RECOLLECTION (A)  Final   Report Status PENDING  Incomplete  MRSA Next Gen by PCR, Nasal     Status: None   Collection Time: 04/19/22  6:31 PM   Specimen: Nasal Mucosa; Nasal Swab  Result Value Ref Range Status   MRSA by PCR Next Gen NOT DETECTED NOT DETECTED Final    Comment: (NOTE) The GeneXpert MRSA Assay (FDA approved for NASAL specimens only), is one component of a comprehensive MRSA colonization surveillance program. It is not intended to diagnose MRSA infection nor to guide or monitor treatment for MRSA infections. Test performance is not FDA approved in patients less than 54 years old. Performed at [facility], [address]  [place], [place] [address]      Total time spend on discharging this patient, including the last patient exam, discussing the hospital stay, instructions for ongoing care as it relates to all pertinent caregivers, as well as preparing the medical discharge records, prescriptions, and/or referrals as applicable, is 40 minutes.    Enzo Bi, MD  [facility] 04/22/2022, 8:35 AM

## 2022-04-22 NOTE — Care Management Important Message (Signed)
Important Message  Patient Details  Name: Roger Rodriguez MRN: [id number] Date of Birth: 12/17/1940   Medicare Important Message Given:  Other (see comment)  Disposition to discharge with hospice services.  Medicare IM withheld at this time out of respect for patient and family.    Dannette Barbara 04/22/2022, 8:56 AM

## 2022-04-22 NOTE — Plan of Care (Signed)

## 2022-04-23 ENCOUNTER — Telehealth: Payer: Self-pay

## 2022-04-23 DIAGNOSIS — G20C Parkinsonism, unspecified: Secondary | ICD-10-CM | POA: Diagnosis not present

## 2022-04-23 LAB — URINE CULTURE

## 2022-04-23 NOTE — Telephone Encounter (Signed)
Orders given to [facility] with [employer]

## 2022-04-23 NOTE — Telephone Encounter (Signed)
Transition Care Management Follow-up Telephone Call Date of discharge and from where: [place] 04/22/2022 How have you been since you were released from the hospital? Better, parkinsons worse Any questions or concerns? nNo  Items Reviewed: Did the pt receive and understand the discharge instructions provided? Yes  Medications obtained and verified? Yes  Other? No  Any new allergies since your discharge? No  Dietary orders reviewed? Yes Do you have support at home? Yes   Home Care and Equipment/Supplies: Were home health services ordered? yes If so, what is the name of the agency? unknown Has the agency set up a time to come to the patient's home? not applicable Were any new equipment or medical supplies ordered?  No What is the name of the medical supply agency? N/a Were you able to get the supplies/equipment? not applicable Do you have any questions related to the use of the equipment or supplies? No  Functional Questionnaire: (I = Independent and D = Dependent) ADLs: d  Bathing/Dressing- D  Meal Prep- D  Eating- I- SOMETIMES NEEDS HELP  Maintaining continence- I  Transferring/Ambulation- I  Managing Meds- D  Follow up appointments reviewed:  PCP Hospital f/u appt confirmed? Yes  Scheduled to see Dr Brita Romp on 04/27/2022 @ 2:20. [facility] f/u appt confirmed? No  Are transportation arrangements needed? Yes  If their condition worsens, is the pt aware to call PCP or go to the Emergency Dept.? Yes Was the patient provided with contact information for the PCP's office or ED? Yes Was to pt encouraged to call back with questions or concerns? Yes Juanda Crumble, LPN [place] Direct Dial [phone]

## 2022-04-23 NOTE — Telephone Encounter (Signed)
Noted  

## 2022-04-24 LAB — CULTURE, BLOOD (ROUTINE X 2)
Culture: NO GROWTH
Culture: NO GROWTH
Special Requests: ADEQUATE
Special Requests: ADEQUATE

## 2022-04-24 NOTE — Progress Notes (Deleted)
   I,Bright Spielmann S Jeramia Saleeby,acting as a Education administrator for Lavon Paganini, MD.,have documented all relevant documentation on the behalf of Lavon Paganini, MD,as directed by  Lavon Paganini, MD while in the presence of Lavon Paganini, MD.     Established patient visit   Patient: Roger Rodriguez   DOB: 08-16-1940   81 y.o. Male  MRN: [id number] Visit Date: 04/27/2022  Today's healthcare provider: Lavon Paganini, MD   No chief complaint on file.  Subjective    HPI  Follow up Hospitalization  Patient was admitted to [facility] on 04/20/22 and discharged on 04/22/2022. He was treated for severe sepsis with acute organ dysfunction. Treatment for this included view note. Cephalexin 500 mg Oral 3 times daily with meals & bedtime. Telephone follow up was done on 04/23/22 He reports {excellent/good/fair:19992} compliance with treatment. He reports this condition is {resolved/improved/worsened:23923}.  ----------------------------------------------------------------------------------------- -   Medications: Outpatient Medications Prior to Visit  Medication Sig   amantadine (SYMMETREL) 100 MG capsule Take 100 mg by mouth daily.   Carbidopa-Levodopa ER 23.75-95 MG CPCR Take 1 capsule by mouth 4 (four) times daily. (Take with 61.'25mg'$ /'245mg'$  capsule doses)   Carbidopa-Levodopa ER 61.25-245 MG CPCR Take 1 capsule by mouth in the morning, at noon, in the evening, and at bedtime. (Take with 23.'75mg'$ /'95mg'$  capsule doses)   celecoxib (CELEBREX) 100 MG capsule TAKE 1 CAPSULE BY MOUTH TWICE A DAY   cephALEXin (KEFLEX) 500 MG capsule Take 1 capsule (500 mg total) by mouth 4 (four) times daily -  with meals and at bedtime for 4 days.   cholecalciferol (VITAMIN D) 1000 units tablet Take 1,000 Units by mouth daily.    folic acid (FOLVITE) 1 MG tablet Take 1 tablet (1 mg total) by mouth daily.   gabapentin (NEURONTIN) 100 MG capsule TAKE 2 CAPSULES (200 MG TOTAL) BY MOUTH 3 (THREE) TIMES DAILY FOR 90 DAYS    glucose blood (QUINTET AC BLOOD GLUCOSE TEST) test strip Use as instructed   metFORMIN (GLUCOPHAGE) 500 MG tablet TAKE 1 TABLET BY MOUTH TWICE A DAY WITH MEALS   pramipexole (MIRAPEX) 0.5 MG tablet Take 0.25 mg by mouth 3 (three) times daily.   vitamin B-12 (CYANOCOBALAMIN) 1000 MCG tablet Take 1,000 mcg by mouth daily.   vitamin C (ASCORBIC ACID) 500 MG tablet Take 1,000 mg by mouth 2 (two) times daily.   vitamin E 180 MG (400 UNITS) capsule Take 400 Units by mouth daily.    No facility-administered medications prior to visit.    Review of Systems  {Labs  Heme  Chem  Endocrine  Serology  Results Review (optional):23779}   Objective    There were no vitals taken for this visit. {Show previous vital signs (optional):23777}  Physical Exam  ***  No results found for any visits on 04/27/22.  Assessment & Plan     ***  No follow-ups on file.      {provider attestation***:1}   Lavon Paganini, MD  [facility] [phone] (phone) [phone] (fax)  [place]

## 2022-04-27 ENCOUNTER — Inpatient Hospital Stay: Payer: PPO | Admitting: Family Medicine

## 2022-04-29 ENCOUNTER — Ambulatory Visit: Payer: PPO | Admitting: Urology

## 2022-05-01 ENCOUNTER — Ambulatory Visit (INDEPENDENT_AMBULATORY_CARE_PROVIDER_SITE_OTHER): Payer: PPO | Admitting: Family Medicine

## 2022-05-01 ENCOUNTER — Encounter: Payer: Self-pay | Admitting: Family Medicine

## 2022-05-01 VITALS — BP 145/85 | HR 80 | Wt 185.0 lb

## 2022-05-01 DIAGNOSIS — R5383 Other fatigue: Secondary | ICD-10-CM | POA: Diagnosis not present

## 2022-05-01 DIAGNOSIS — R3989 Other symptoms and signs involving the genitourinary system: Secondary | ICD-10-CM

## 2022-05-01 NOTE — Assessment & Plan Note (Signed)
We discussed importance of rest and nutrition as patient recovers from hospital stay Recommended having snacks and foods that the patient enjoys and allow the patient to rest as needed in order to help with energy Also recommended a multivitamin emphasizing B complex vitamins No additional medications at this time Given that patient wants to remain home is much as possible, we will not collect additional lab work at this time Patient to continue to follow-up with home hospice  nurses

## 2022-05-01 NOTE — Assessment & Plan Note (Signed)
Patient was treated for urosepsis as inpatient and completed course of IV antibiotics He has completed outpatient course of Keflex Patient denies any urinary symptoms Recommended that patient and family continue to monitor for any development of fevers, abdominal pain or abnormal appearance of urine in catheter bag No additional recommendations at this time patient denies pain at this time

## 2022-05-01 NOTE — Progress Notes (Signed)
I,Joseline E Rosas,acting as a scribe for [employer], MD.,have documented all relevant documentation on the behalf of Roger Foster, MD,as directed by  Roger Foster, MD while in the presence of Roger Foster, MD.   Virtual telephone visit    Virtual Visit via Telephone Note   This visit type was conducted due to national recommendations for restrictions regarding the COVID-19 Pandemic (e.g. social distancing) in an effort to limit this patient's exposure and mitigate transmission in our community. Due to his co-morbid illnesses, this patient is at least at moderate risk for complications without adequate follow up. This format is felt to be most appropriate for this patient at this time. The patient did not have access to video technology or had technical difficulties with video requiring transitioning to audio format only (telephone). Physical exam was limited to content and character of the telephone converstion.    Patient location: home address  Provider location: [facility]   I discussed the limitations of evaluation and management by telemedicine and the availability of in person appointments. The patient expressed understanding and agreed to proceed.   Visit Date: 05/01/2022  Today's healthcare provider: Eulis Foster, MD   Chief Complaint  Patient presents with   Hospitalization Follow-up   Subjective    HPI  Follow up Hospitalization  Patient was admitted to [facility] on 04/19/2022 and discharged on [id number]. He was treated for Severe sepsis with acute organ dysfuntion. Treatment for this included pt received vanc/cefe/flagyl on presentation, then 2 days of ceftriaxone and discharged on Keflex to finish 7-day course. Telephone follow up was done on 04/23/2022. -------------------------------------------------------------------------  Patient reports feeling tired and having less energy than when he went into the hospital   The hospice nurses have been coming by the house  He will start using incentive spirometry  Patient reports sometimes having HA but none currently  Appetite is sporadic -last night had 1/2 sandwich   Patient has been sleeping more during the day  Patient has not been coughing but does sometimes coughs after drinking  Patient temp is sometimes elevated at 100  in the mornings but has not been in the last two days  They report that his urine seems clear He has been taking iralax and prune juice to help with constipation, last BM was three days ago  The pressure ulcers are healed nicely  Patient has been able to tolerate medications by either crushing them or using applesauce   Goal is to keep patient home as much as possible  Patient denies being in pain at this time     Medications: Outpatient Medications Prior to Visit  Medication Sig   amantadine (SYMMETREL) 100 MG capsule Take 100 mg by mouth daily.   celecoxib (CELEBREX) 100 MG capsule TAKE 1 CAPSULE BY MOUTH TWICE A DAY   cholecalciferol (VITAMIN D) 1000 units tablet Take 1,000 Units by mouth daily.    folic acid (FOLVITE) 1 MG tablet Take 1 tablet (1 mg total) by mouth daily.   gabapentin (NEURONTIN) 100 MG capsule TAKE 2 CAPSULES (200 MG TOTAL) BY MOUTH 3 (THREE) TIMES DAILY FOR 90 DAYS   glucose blood (QUINTET AC BLOOD GLUCOSE TEST) test strip Use as instructed   metFORMIN (GLUCOPHAGE) 500 MG tablet TAKE 1 TABLET BY MOUTH TWICE A DAY WITH MEALS   pramipexole (MIRAPEX) 0.5 MG tablet Take 0.25 mg by mouth 3 (three) times daily.   vitamin B-12 (CYANOCOBALAMIN) 1000 MCG tablet Take 1,000 mcg by mouth daily.   vitamin C (ASCORBIC  ACID) 500 MG tablet Take 1,000 mg by mouth 2 (two) times daily.   vitamin E 180 MG (400 UNITS) capsule Take 400 Units by mouth daily.    Carbidopa-Levodopa ER 23.75-95 MG CPCR Take 1 capsule by mouth 4 (four) times daily. (Take with 61.'25mg'$ /'245mg'$  capsule doses)   Carbidopa-Levodopa ER 61.25-245 MG CPCR  Take 1 capsule by mouth in the morning, at noon, in the evening, and at bedtime. (Take with 23.'75mg'$ /'95mg'$  capsule doses)   No facility-administered medications prior to visit.    Review of Systems     Objective    BP (!) 145/85 (BP Location: Right Arm, Patient Position: Sitting, Cuff Size: Normal) Comment: Per patient's son  Pulse 80   Wt 185 lb (83.9 kg) Comment: Per patient's son Roger Rodriguez  SpO2 93%   BMI 25.09 kg/m   General: Patient is soft-spoken, able to speak in complete sentences, on room air and does not sound as if he is in respiratory distress    Assessment & Plan     Problem List Items Addressed This Visit       Other   Suspected UTI - Primary    Patient was treated for urosepsis as inpatient and completed course of IV antibiotics He has completed outpatient course of Keflex Patient denies any urinary symptoms Recommended that patient and family continue to monitor for any development of fevers, abdominal pain or abnormal appearance of urine in catheter bag No additional recommendations at this time patient denies pain at this time       Decreased energy    We discussed importance of rest and nutrition as patient recovers from hospital stay Recommended having snacks and foods that the patient enjoys and allow the patient to rest as needed in order to help with energy Also recommended a multivitamin emphasizing B complex vitamins No additional medications at this time Given that patient wants to remain home is much as possible, we will not collect additional lab work at this time Patient to continue to follow-up with home hospice  nurses        No follow-ups on file.    I discussed the assessment and treatment plan with the patient. The patient was provided an opportunity to ask questions and all were answered. The patient agreed with the plan and demonstrated an understanding of the instructions.   The patient was advised to call back or seek an in-person  evaluation if the symptoms worsen or if the condition fails to improve as anticipated.  I provided 14 minutes of non-face-to-face time during this encounter.  Cain Sieve Simmons-Robinson MD, have reviewed all documentation for this visit. The documentation for the exam, diagnosis, procedures, and orders are all accurate and complete.   Roger Foster, MD [facility] [phone] (phone) [phone] (fax)  [place]

## 2022-05-16 DIAGNOSIS — G20C Parkinsonism, unspecified: Secondary | ICD-10-CM | POA: Diagnosis not present

## 2022-05-24 DIAGNOSIS — G20C Parkinsonism, unspecified: Secondary | ICD-10-CM | POA: Diagnosis not present

## 2022-06-15 DIAGNOSIS — G20C Parkinsonism, unspecified: Secondary | ICD-10-CM | POA: Diagnosis not present

## 2022-06-23 DIAGNOSIS — G20C Parkinsonism, unspecified: Secondary | ICD-10-CM | POA: Diagnosis not present

## 2022-07-16 DIAGNOSIS — G20C Parkinsonism, unspecified: Secondary | ICD-10-CM | POA: Diagnosis not present

## 2022-07-24 DIAGNOSIS — G20C Parkinsonism, unspecified: Secondary | ICD-10-CM | POA: Diagnosis not present

## 2022-08-11 ENCOUNTER — Other Ambulatory Visit (HOSPITAL_COMMUNITY): Payer: Self-pay

## 2022-08-16 DIAGNOSIS — G20C Parkinsonism, unspecified: Secondary | ICD-10-CM | POA: Diagnosis not present

## 2022-08-24 DIAGNOSIS — G20C Parkinsonism, unspecified: Secondary | ICD-10-CM | POA: Diagnosis not present

## 2022-09-14 DIAGNOSIS — G20C Parkinsonism, unspecified: Secondary | ICD-10-CM | POA: Diagnosis not present

## 2022-09-22 DIAGNOSIS — G20C Parkinsonism, unspecified: Secondary | ICD-10-CM | POA: Diagnosis not present

## 2022-10-12 DEATH — deceased

## 2022-11-19 ENCOUNTER — Telehealth: Payer: Self-pay | Admitting: Family Medicine

## 2022-11-19 NOTE — Telephone Encounter (Signed)
Copied from CRM [id number]. Topic: Medicare AWV >> Nov 19, 2022  3:45 PM Rushie Goltz wrote: Reason for CRM: Called patient to schedule Medicare Annual Wellness Visit (AWV). Unable to reach patient. Call won't go through on home # and no voice mail on cell#.  Last date of AWV: 08/14/2021  Please schedule an AWVS appointment at any time with [facility] ANNUAL WELLNESS VISIT.  If any questions, please contact me at [phone].    Thank you,  [facility] Direct dial  [phone]

## 2023-06-11 ENCOUNTER — Other Ambulatory Visit (HOSPITAL_COMMUNITY): Payer: Self-pay

## 2023-08-24 ENCOUNTER — Other Ambulatory Visit (HOSPITAL_COMMUNITY): Payer: Self-pay
